# Patient Record
Sex: Male | Born: 1945
Health system: Southern US, Community
[De-identification: ages and names within clinical notes are randomized; demographics above are authoritative.]

## PROBLEM LIST (undated history)

## (undated) DIAGNOSIS — H9190 Unspecified hearing loss, unspecified ear: Secondary | ICD-10-CM

## (undated) DIAGNOSIS — A0472 Enterocolitis due to Clostridium difficile, not specified as recurrent: Secondary | ICD-10-CM

## (undated) DIAGNOSIS — N529 Male erectile dysfunction, unspecified: Secondary | ICD-10-CM

## (undated) DIAGNOSIS — G8929 Other chronic pain: Secondary | ICD-10-CM

## (undated) DIAGNOSIS — I2510 Atherosclerotic heart disease of native coronary artery without angina pectoris: Secondary | ICD-10-CM

## (undated) DIAGNOSIS — I4891 Unspecified atrial fibrillation: Secondary | ICD-10-CM

## (undated) DIAGNOSIS — I509 Heart failure, unspecified: Secondary | ICD-10-CM

## (undated) DIAGNOSIS — N400 Enlarged prostate without lower urinary tract symptoms: Secondary | ICD-10-CM

## (undated) DIAGNOSIS — R0602 Shortness of breath: Secondary | ICD-10-CM

## (undated) DIAGNOSIS — M359 Systemic involvement of connective tissue, unspecified: Secondary | ICD-10-CM

## (undated) DIAGNOSIS — E785 Hyperlipidemia, unspecified: Secondary | ICD-10-CM

## (undated) DIAGNOSIS — F419 Anxiety disorder, unspecified: Secondary | ICD-10-CM

## (undated) DIAGNOSIS — Z720 Tobacco use: Secondary | ICD-10-CM

## (undated) DIAGNOSIS — E1142 Type 2 diabetes mellitus with diabetic polyneuropathy: Secondary | ICD-10-CM

## (undated) DIAGNOSIS — N200 Calculus of kidney: Secondary | ICD-10-CM

## (undated) DIAGNOSIS — F32A Depression, unspecified: Secondary | ICD-10-CM

## (undated) DIAGNOSIS — K219 Gastro-esophageal reflux disease without esophagitis: Secondary | ICD-10-CM

## (undated) DIAGNOSIS — Z9889 Other specified postprocedural states: Secondary | ICD-10-CM

## (undated) DIAGNOSIS — M1990 Unspecified osteoarthritis, unspecified site: Secondary | ICD-10-CM

## (undated) DIAGNOSIS — D369 Benign neoplasm, unspecified site: Secondary | ICD-10-CM

## (undated) DIAGNOSIS — Z950 Presence of cardiac pacemaker: Secondary | ICD-10-CM

## (undated) DIAGNOSIS — I639 Cerebral infarction, unspecified: Secondary | ICD-10-CM

## (undated) DIAGNOSIS — E291 Testicular hypofunction: Secondary | ICD-10-CM

## (undated) DIAGNOSIS — M545 Low back pain, unspecified: Secondary | ICD-10-CM

## (undated) DIAGNOSIS — K8020 Calculus of gallbladder without cholecystitis without obstruction: Secondary | ICD-10-CM

## (undated) DIAGNOSIS — R519 Headache, unspecified: Secondary | ICD-10-CM

## (undated) DIAGNOSIS — F329 Major depressive disorder, single episode, unspecified: Secondary | ICD-10-CM

## (undated) DIAGNOSIS — R51 Headache: Secondary | ICD-10-CM

## (undated) DIAGNOSIS — J449 Chronic obstructive pulmonary disease, unspecified: Secondary | ICD-10-CM

## (undated) DIAGNOSIS — J309 Allergic rhinitis, unspecified: Secondary | ICD-10-CM

## (undated) DIAGNOSIS — Z95810 Presence of automatic (implantable) cardiac defibrillator: Secondary | ICD-10-CM

## (undated) DIAGNOSIS — I219 Acute myocardial infarction, unspecified: Secondary | ICD-10-CM

## (undated) DIAGNOSIS — Z8673 Personal history of transient ischemic attack (TIA), and cerebral infarction without residual deficits: Secondary | ICD-10-CM

## (undated) DIAGNOSIS — H43392 Other vitreous opacities, left eye: Secondary | ICD-10-CM

## (undated) DIAGNOSIS — E119 Type 2 diabetes mellitus without complications: Secondary | ICD-10-CM

## (undated) DIAGNOSIS — I10 Essential (primary) hypertension: Secondary | ICD-10-CM

## (undated) DIAGNOSIS — I679 Cerebrovascular disease, unspecified: Secondary | ICD-10-CM

## (undated) DIAGNOSIS — R413 Other amnesia: Secondary | ICD-10-CM

## (undated) HISTORY — DX: Gastro-esophageal reflux disease without esophagitis: K21.9

## (undated) HISTORY — DX: Benign neoplasm, unspecified site: D36.9

## (undated) HISTORY — DX: Testicular hypofunction: E29.1

## (undated) HISTORY — DX: Atherosclerotic heart disease of native coronary artery without angina pectoris: I25.10

## (undated) HISTORY — DX: Essential (primary) hypertension: I10

## (undated) HISTORY — DX: Enterocolitis due to Clostridium difficile, not specified as recurrent: A04.72

## (undated) HISTORY — PX: RETINAL LASER PROCEDURE: SHX2339

## (undated) HISTORY — DX: Cerebrovascular disease, unspecified: I67.9

## (undated) HISTORY — PX: BACK SURGERY: SHX140

## (undated) HISTORY — DX: Male erectile dysfunction, unspecified: N52.9

## (undated) HISTORY — PX: NASAL HEMORRHAGE CONTROL: SHX287

## (undated) HISTORY — DX: Tobacco use: Z72.0

## (undated) HISTORY — PX: FRACTURE SURGERY: SHX138

## (undated) HISTORY — PX: TONSILLECTOMY AND ADENOIDECTOMY: SUR1326

## (undated) HISTORY — DX: Chronic obstructive pulmonary disease, unspecified: J44.9

## (undated) HISTORY — DX: Calculus of gallbladder without cholecystitis without obstruction: K80.20

## (undated) HISTORY — DX: Benign prostatic hyperplasia without lower urinary tract symptoms: N40.0

## (undated) HISTORY — DX: Calculus of kidney: N20.0

## (undated) HISTORY — DX: Allergic rhinitis, unspecified: J30.9

## (undated) HISTORY — DX: Unspecified hearing loss, unspecified ear: H91.90

## (undated) HISTORY — DX: Unspecified atrial fibrillation: I48.91

## (undated) HISTORY — DX: Other amnesia: R41.3

## (undated) HISTORY — PX: INSERT / REPLACE / REMOVE PACEMAKER: SUR710

## (undated) HISTORY — DX: Hyperlipidemia, unspecified: E78.5

## (undated) HISTORY — DX: Type 2 diabetes mellitus with diabetic polyneuropathy: E11.42

## (undated) HISTORY — DX: Other vitreous opacities, left eye: H43.392

## (undated) HISTORY — PX: LUMBAR DISC SURGERY: SHX700

## (undated) HISTORY — DX: Unspecified osteoarthritis, unspecified site: M19.90

## (undated) HISTORY — DX: Cerebral infarction, unspecified: I63.9

## (undated) HISTORY — PX: CORONARY ANGIOPLASTY WITH STENT PLACEMENT: SHX49

## (undated) HISTORY — DX: Other specified postprocedural states: Z98.890

---

## 1984-05-03 HISTORY — PX: OTHER SURGICAL HISTORY: SHX169

## 1993-05-03 DIAGNOSIS — I219 Acute myocardial infarction, unspecified: Secondary | ICD-10-CM

## 1993-05-03 HISTORY — DX: Acute myocardial infarction, unspecified: I21.9

## 1995-05-04 DIAGNOSIS — I2510 Atherosclerotic heart disease of native coronary artery without angina pectoris: Secondary | ICD-10-CM

## 1995-05-04 HISTORY — DX: Atherosclerotic heart disease of native coronary artery without angina pectoris: I25.10

## 1998-06-16 ENCOUNTER — Encounter: Payer: Self-pay | Admitting: Specialist

## 1998-06-16 ENCOUNTER — Ambulatory Visit (HOSPITAL_COMMUNITY): Admission: RE | Admit: 1998-06-16 | Discharge: 1998-06-16 | Payer: Self-pay | Admitting: Specialist

## 1998-08-14 ENCOUNTER — Inpatient Hospital Stay (HOSPITAL_COMMUNITY): Admission: RE | Admit: 1998-08-14 | Discharge: 1998-08-18 | Payer: Self-pay | Admitting: Specialist

## 1998-08-14 ENCOUNTER — Encounter: Payer: Self-pay | Admitting: Specialist

## 1999-10-08 ENCOUNTER — Ambulatory Visit (HOSPITAL_COMMUNITY): Admission: RE | Admit: 1999-10-08 | Discharge: 1999-10-08 | Payer: Self-pay | Admitting: Urology

## 1999-10-08 ENCOUNTER — Encounter: Payer: Self-pay | Admitting: Urology

## 2000-05-03 DIAGNOSIS — M1990 Unspecified osteoarthritis, unspecified site: Secondary | ICD-10-CM

## 2000-05-03 HISTORY — DX: Unspecified osteoarthritis, unspecified site: M19.90

## 2000-08-24 ENCOUNTER — Ambulatory Visit (HOSPITAL_COMMUNITY): Admission: RE | Admit: 2000-08-24 | Discharge: 2000-08-24 | Payer: Self-pay | Admitting: Family Medicine

## 2000-08-24 ENCOUNTER — Encounter: Payer: Self-pay | Admitting: Family Medicine

## 2000-12-01 ENCOUNTER — Encounter: Payer: Self-pay | Admitting: Family Medicine

## 2000-12-01 ENCOUNTER — Ambulatory Visit (HOSPITAL_COMMUNITY): Admission: RE | Admit: 2000-12-01 | Discharge: 2000-12-01 | Payer: Self-pay | Admitting: Family Medicine

## 2000-12-01 HISTORY — PX: ANTERIOR FUSION CERVICAL SPINE: SUR626

## 2000-12-12 ENCOUNTER — Encounter: Admission: RE | Admit: 2000-12-12 | Discharge: 2000-12-12 | Payer: Self-pay | Admitting: Neurosurgery

## 2000-12-12 ENCOUNTER — Encounter: Payer: Self-pay | Admitting: Neurosurgery

## 2000-12-21 ENCOUNTER — Encounter: Payer: Self-pay | Admitting: Neurosurgery

## 2000-12-21 ENCOUNTER — Inpatient Hospital Stay (HOSPITAL_COMMUNITY): Admission: RE | Admit: 2000-12-21 | Discharge: 2000-12-22 | Payer: Self-pay | Admitting: Neurosurgery

## 2001-01-12 ENCOUNTER — Encounter: Admission: RE | Admit: 2001-01-12 | Discharge: 2001-01-12 | Payer: Self-pay | Admitting: Neurosurgery

## 2001-01-12 ENCOUNTER — Encounter: Payer: Self-pay | Admitting: Neurosurgery

## 2001-02-13 ENCOUNTER — Encounter: Payer: Self-pay | Admitting: Neurosurgery

## 2001-02-13 ENCOUNTER — Encounter: Admission: RE | Admit: 2001-02-13 | Discharge: 2001-02-13 | Payer: Self-pay | Admitting: Neurosurgery

## 2001-03-21 ENCOUNTER — Encounter (HOSPITAL_COMMUNITY): Admission: RE | Admit: 2001-03-21 | Discharge: 2001-04-20 | Payer: Self-pay | Admitting: Neurosurgery

## 2001-04-04 ENCOUNTER — Ambulatory Visit (HOSPITAL_COMMUNITY): Admission: RE | Admit: 2001-04-04 | Discharge: 2001-04-04 | Payer: Self-pay | Admitting: Family Medicine

## 2001-04-04 ENCOUNTER — Encounter: Payer: Self-pay | Admitting: Family Medicine

## 2001-04-20 ENCOUNTER — Encounter (HOSPITAL_COMMUNITY): Admission: RE | Admit: 2001-04-20 | Discharge: 2001-05-20 | Payer: Self-pay | Admitting: Neurosurgery

## 2001-05-22 ENCOUNTER — Encounter (HOSPITAL_COMMUNITY): Admission: RE | Admit: 2001-05-22 | Discharge: 2001-06-21 | Payer: Self-pay | Admitting: Neurosurgery

## 2001-06-21 ENCOUNTER — Ambulatory Visit (HOSPITAL_COMMUNITY): Admission: RE | Admit: 2001-06-21 | Discharge: 2001-06-21 | Payer: Self-pay | Admitting: Neurosurgery

## 2001-07-20 ENCOUNTER — Ambulatory Visit (HOSPITAL_COMMUNITY): Admission: RE | Admit: 2001-07-20 | Discharge: 2001-07-20 | Payer: Self-pay | Admitting: Family Medicine

## 2001-07-20 ENCOUNTER — Encounter: Payer: Self-pay | Admitting: Family Medicine

## 2001-08-22 ENCOUNTER — Ambulatory Visit (HOSPITAL_COMMUNITY): Admission: RE | Admit: 2001-08-22 | Discharge: 2001-08-22 | Payer: Self-pay

## 2001-10-30 ENCOUNTER — Encounter: Payer: Self-pay | Admitting: Orthopaedic Surgery

## 2001-10-30 ENCOUNTER — Ambulatory Visit (HOSPITAL_COMMUNITY): Admission: RE | Admit: 2001-10-30 | Discharge: 2001-10-30 | Payer: Self-pay | Admitting: Orthopaedic Surgery

## 2001-11-15 ENCOUNTER — Encounter: Admission: RE | Admit: 2001-11-15 | Discharge: 2001-11-15 | Payer: Self-pay | Admitting: Orthopaedic Surgery

## 2001-11-15 ENCOUNTER — Encounter: Payer: Self-pay | Admitting: Orthopaedic Surgery

## 2001-12-18 ENCOUNTER — Encounter: Payer: Self-pay | Admitting: Orthopaedic Surgery

## 2001-12-21 ENCOUNTER — Inpatient Hospital Stay (HOSPITAL_COMMUNITY): Admission: AD | Admit: 2001-12-21 | Discharge: 2001-12-27 | Payer: Self-pay | Admitting: Family Medicine

## 2002-01-15 ENCOUNTER — Encounter (HOSPITAL_COMMUNITY): Admission: RE | Admit: 2002-01-15 | Discharge: 2002-02-14 | Payer: Self-pay | Admitting: Cardiology

## 2002-01-30 ENCOUNTER — Ambulatory Visit (HOSPITAL_COMMUNITY): Admission: RE | Admit: 2002-01-30 | Discharge: 2002-01-30 | Payer: Self-pay | Admitting: Cardiology

## 2002-01-31 ENCOUNTER — Ambulatory Visit (HOSPITAL_COMMUNITY): Admission: RE | Admit: 2002-01-31 | Discharge: 2002-02-01 | Payer: Self-pay | Admitting: *Deleted

## 2002-02-12 ENCOUNTER — Encounter (HOSPITAL_COMMUNITY): Admission: RE | Admit: 2002-02-12 | Discharge: 2002-03-14 | Payer: Self-pay | Admitting: Cardiology

## 2002-02-16 ENCOUNTER — Encounter: Payer: Self-pay | Admitting: Family Medicine

## 2002-02-16 ENCOUNTER — Ambulatory Visit (HOSPITAL_COMMUNITY): Admission: RE | Admit: 2002-02-16 | Discharge: 2002-02-16 | Payer: Self-pay | Admitting: Family Medicine

## 2002-03-18 ENCOUNTER — Emergency Department (HOSPITAL_COMMUNITY): Admission: EM | Admit: 2002-03-18 | Discharge: 2002-03-19 | Payer: Self-pay | Admitting: Emergency Medicine

## 2002-08-29 ENCOUNTER — Inpatient Hospital Stay (HOSPITAL_COMMUNITY): Admission: RE | Admit: 2002-08-29 | Discharge: 2002-08-30 | Payer: Self-pay | Admitting: Orthopaedic Surgery

## 2002-08-29 ENCOUNTER — Encounter: Payer: Self-pay | Admitting: Orthopaedic Surgery

## 2003-05-04 DIAGNOSIS — Z95810 Presence of automatic (implantable) cardiac defibrillator: Secondary | ICD-10-CM

## 2003-05-04 HISTORY — DX: Presence of automatic (implantable) cardiac defibrillator: Z95.810

## 2003-07-17 ENCOUNTER — Inpatient Hospital Stay (HOSPITAL_COMMUNITY): Admission: AD | Admit: 2003-07-17 | Discharge: 2003-07-19 | Payer: Self-pay | Admitting: Family Medicine

## 2003-07-19 ENCOUNTER — Ambulatory Visit (HOSPITAL_COMMUNITY): Admission: RE | Admit: 2003-07-19 | Discharge: 2003-07-20 | Payer: Self-pay | Admitting: *Deleted

## 2003-11-01 HISTORY — PX: CARDIAC DEFIBRILLATOR PLACEMENT: SHX171

## 2003-11-20 ENCOUNTER — Ambulatory Visit (HOSPITAL_COMMUNITY): Admission: RE | Admit: 2003-11-20 | Discharge: 2003-11-20 | Payer: Self-pay | Admitting: Internal Medicine

## 2003-11-25 ENCOUNTER — Encounter: Payer: Self-pay | Admitting: Cardiology

## 2003-11-25 ENCOUNTER — Inpatient Hospital Stay (HOSPITAL_COMMUNITY): Admission: EM | Admit: 2003-11-25 | Discharge: 2003-11-26 | Payer: Self-pay | Admitting: Emergency Medicine

## 2003-12-26 ENCOUNTER — Ambulatory Visit (HOSPITAL_COMMUNITY): Admission: RE | Admit: 2003-12-26 | Discharge: 2003-12-26 | Payer: Self-pay | Admitting: Family Medicine

## 2004-01-30 ENCOUNTER — Ambulatory Visit (HOSPITAL_COMMUNITY): Admission: RE | Admit: 2004-01-30 | Discharge: 2004-01-30 | Payer: Self-pay | Admitting: Family Medicine

## 2004-03-24 ENCOUNTER — Ambulatory Visit (HOSPITAL_COMMUNITY): Admission: RE | Admit: 2004-03-24 | Discharge: 2004-03-24 | Payer: Self-pay | Admitting: Family Medicine

## 2004-04-20 ENCOUNTER — Observation Stay (HOSPITAL_COMMUNITY): Admission: RE | Admit: 2004-04-20 | Discharge: 2004-04-20 | Payer: Self-pay | Admitting: Specialist

## 2004-07-16 ENCOUNTER — Ambulatory Visit: Payer: Self-pay | Admitting: Cardiology

## 2004-08-04 ENCOUNTER — Ambulatory Visit: Payer: Self-pay | Admitting: *Deleted

## 2005-02-12 ENCOUNTER — Emergency Department (HOSPITAL_COMMUNITY): Admission: EM | Admit: 2005-02-12 | Discharge: 2005-02-13 | Payer: Self-pay | Admitting: *Deleted

## 2005-05-03 HISTORY — PX: COLONOSCOPY: SHX174

## 2005-06-22 ENCOUNTER — Ambulatory Visit: Payer: Self-pay | Admitting: Gastroenterology

## 2005-07-06 ENCOUNTER — Ambulatory Visit: Payer: Self-pay | Admitting: Gastroenterology

## 2005-07-06 ENCOUNTER — Encounter (INDEPENDENT_AMBULATORY_CARE_PROVIDER_SITE_OTHER): Payer: Self-pay | Admitting: *Deleted

## 2005-09-01 ENCOUNTER — Ambulatory Visit: Payer: Self-pay | Admitting: Gastroenterology

## 2005-09-03 ENCOUNTER — Encounter (INDEPENDENT_AMBULATORY_CARE_PROVIDER_SITE_OTHER): Payer: Self-pay | Admitting: *Deleted

## 2005-09-03 ENCOUNTER — Ambulatory Visit: Payer: Self-pay | Admitting: Gastroenterology

## 2005-09-06 ENCOUNTER — Ambulatory Visit: Payer: Self-pay | Admitting: Cardiology

## 2005-10-14 ENCOUNTER — Ambulatory Visit: Payer: Self-pay | Admitting: *Deleted

## 2006-01-07 ENCOUNTER — Ambulatory Visit: Payer: Self-pay | Admitting: Internal Medicine

## 2006-02-17 ENCOUNTER — Ambulatory Visit: Payer: Self-pay | Admitting: Cardiology

## 2006-02-17 ENCOUNTER — Ambulatory Visit (HOSPITAL_COMMUNITY): Admission: RE | Admit: 2006-02-17 | Discharge: 2006-02-17 | Payer: Self-pay | Admitting: Cardiology

## 2006-02-23 ENCOUNTER — Ambulatory Visit: Payer: Self-pay | Admitting: Cardiovascular Disease

## 2006-02-23 ENCOUNTER — Encounter (HOSPITAL_COMMUNITY): Admission: RE | Admit: 2006-02-23 | Discharge: 2006-03-25 | Payer: Self-pay | Admitting: Cardiology

## 2006-02-28 ENCOUNTER — Ambulatory Visit: Payer: Self-pay | Admitting: Cardiology

## 2006-04-08 ENCOUNTER — Ambulatory Visit: Payer: Self-pay | Admitting: Internal Medicine

## 2006-05-03 HISTORY — PX: KNEE ARTHROSCOPY: SHX127

## 2006-05-04 ENCOUNTER — Ambulatory Visit: Payer: Self-pay | Admitting: Cardiology

## 2006-05-13 ENCOUNTER — Ambulatory Visit: Payer: Self-pay | Admitting: Internal Medicine

## 2006-07-20 ENCOUNTER — Encounter: Admission: RE | Admit: 2006-07-20 | Discharge: 2006-07-20 | Payer: Self-pay | Admitting: Orthopedic Surgery

## 2006-08-23 ENCOUNTER — Ambulatory Visit: Payer: Self-pay | Admitting: Cardiology

## 2006-09-01 HISTORY — PX: ANKLE FRACTURE SURGERY: SHX122

## 2006-09-07 ENCOUNTER — Ambulatory Visit (HOSPITAL_COMMUNITY): Admission: RE | Admit: 2006-09-07 | Discharge: 2006-09-07 | Payer: Self-pay | Admitting: Orthopedic Surgery

## 2006-09-21 ENCOUNTER — Encounter (HOSPITAL_COMMUNITY): Admission: RE | Admit: 2006-09-21 | Discharge: 2006-10-21 | Payer: Self-pay | Admitting: Orthopedic Surgery

## 2006-10-07 ENCOUNTER — Ambulatory Visit: Payer: Self-pay | Admitting: Internal Medicine

## 2006-11-15 ENCOUNTER — Ambulatory Visit: Payer: Self-pay | Admitting: Cardiology

## 2006-12-22 ENCOUNTER — Ambulatory Visit: Payer: Self-pay | Admitting: Endocrinology

## 2006-12-29 ENCOUNTER — Ambulatory Visit: Payer: Self-pay | Admitting: Endocrinology

## 2006-12-29 LAB — CONVERTED CEMR LAB: Testosterone: 264.12 ng/dL — ABNORMAL LOW (ref 350.00–890)

## 2007-01-12 ENCOUNTER — Encounter: Payer: Self-pay | Admitting: Endocrinology

## 2007-01-12 ENCOUNTER — Ambulatory Visit: Payer: Self-pay | Admitting: Endocrinology

## 2007-01-12 DIAGNOSIS — K219 Gastro-esophageal reflux disease without esophagitis: Secondary | ICD-10-CM | POA: Insufficient documentation

## 2007-01-12 DIAGNOSIS — E1159 Type 2 diabetes mellitus with other circulatory complications: Secondary | ICD-10-CM | POA: Insufficient documentation

## 2007-01-12 DIAGNOSIS — F329 Major depressive disorder, single episode, unspecified: Secondary | ICD-10-CM | POA: Insufficient documentation

## 2007-01-12 DIAGNOSIS — I10 Essential (primary) hypertension: Secondary | ICD-10-CM | POA: Insufficient documentation

## 2007-01-12 DIAGNOSIS — N400 Enlarged prostate without lower urinary tract symptoms: Secondary | ICD-10-CM | POA: Insufficient documentation

## 2007-01-12 DIAGNOSIS — J309 Allergic rhinitis, unspecified: Secondary | ICD-10-CM | POA: Insufficient documentation

## 2007-01-12 LAB — CONVERTED CEMR LAB
FSH: 6.8 milliintl units/mL
INR: 0.9 (ref 0.8–1.0)
LH: 5 milliintl units/mL
Prothrombin Time: 11.2 s (ref 10.9–13.3)
TSH: 1.95 microintl units/mL (ref 0.35–5.50)

## 2007-01-20 ENCOUNTER — Ambulatory Visit (HOSPITAL_COMMUNITY): Admission: RE | Admit: 2007-01-20 | Discharge: 2007-01-20 | Payer: Self-pay | Admitting: Family Medicine

## 2007-02-02 ENCOUNTER — Encounter: Payer: Self-pay | Admitting: *Deleted

## 2007-02-03 ENCOUNTER — Encounter: Payer: Self-pay | Admitting: Endocrinology

## 2007-02-03 ENCOUNTER — Ambulatory Visit: Payer: Self-pay | Admitting: Endocrinology

## 2007-02-03 LAB — CONVERTED CEMR LAB
BUN: 7 mg/dL (ref 6–23)
Creatinine, Ser: 0.8 mg/dL (ref 0.4–1.5)
Prolactin: 7.1 ng/mL
Testosterone: 274.5 ng/dL — ABNORMAL LOW (ref 350.00–890)

## 2007-02-06 ENCOUNTER — Ambulatory Visit: Admission: RE | Admit: 2007-02-06 | Discharge: 2007-02-06 | Payer: Self-pay | Admitting: Family Medicine

## 2007-02-15 ENCOUNTER — Ambulatory Visit: Payer: Self-pay | Admitting: Pulmonary Disease

## 2007-02-16 ENCOUNTER — Ambulatory Visit: Payer: Self-pay | Admitting: Cardiology

## 2007-03-06 ENCOUNTER — Ambulatory Visit: Payer: Self-pay | Admitting: Endocrinology

## 2007-03-06 DIAGNOSIS — E291 Testicular hypofunction: Secondary | ICD-10-CM | POA: Insufficient documentation

## 2007-04-07 ENCOUNTER — Ambulatory Visit (HOSPITAL_COMMUNITY): Admission: RE | Admit: 2007-04-07 | Discharge: 2007-04-07 | Payer: Self-pay | Admitting: Specialist

## 2007-05-02 ENCOUNTER — Ambulatory Visit: Payer: Self-pay | Admitting: Endocrinology

## 2007-05-03 LAB — CONVERTED CEMR LAB
Hgb A1c MFr Bld: 9 % — ABNORMAL HIGH (ref 4.6–6.0)
Testosterone: 439.78 ng/dL (ref 350.00–890)

## 2007-05-04 HISTORY — PX: TOTAL KNEE ARTHROPLASTY: SHX125

## 2007-05-09 ENCOUNTER — Ambulatory Visit: Payer: Self-pay | Admitting: Internal Medicine

## 2007-05-10 ENCOUNTER — Ambulatory Visit: Payer: Self-pay | Admitting: Cardiology

## 2007-06-13 ENCOUNTER — Ambulatory Visit: Payer: Self-pay | Admitting: Endocrinology

## 2007-06-22 ENCOUNTER — Telehealth (INDEPENDENT_AMBULATORY_CARE_PROVIDER_SITE_OTHER): Payer: Self-pay | Admitting: *Deleted

## 2007-07-07 ENCOUNTER — Ambulatory Visit: Payer: Self-pay | Admitting: Internal Medicine

## 2007-08-10 ENCOUNTER — Encounter (INDEPENDENT_AMBULATORY_CARE_PROVIDER_SITE_OTHER): Payer: Self-pay | Admitting: *Deleted

## 2007-08-10 LAB — CONVERTED CEMR LAB
ALT: 32 units/L
AST: 25 units/L
Albumin: 3.9 g/dL
Alkaline Phosphatase: 57 units/L
BUN: 13 mg/dL
CO2: 26 meq/L
Calcium: 8.9 mg/dL
Chloride: 105 meq/L
Cholesterol: 151 mg/dL
Creatinine, Ser: 0.95 mg/dL
Glucose, Bld: 273 mg/dL
HDL: 33 mg/dL
LDL Cholesterol: 83 mg/dL
Potassium: 4.2 meq/L
Sodium: 141 meq/L
Total Protein: 6 g/dL
Triglycerides: 177 mg/dL

## 2007-08-29 ENCOUNTER — Ambulatory Visit: Payer: Self-pay | Admitting: Endocrinology

## 2007-08-29 LAB — CONVERTED CEMR LAB
Creatinine,U: 92.1 mg/dL
Hgb A1c MFr Bld: 9.9 % — ABNORMAL HIGH (ref 4.6–6.0)
Microalb Creat Ratio: 14.1 mg/g (ref 0.0–30.0)
Microalb, Ur: 1.3 mg/dL (ref 0.0–1.9)

## 2007-09-05 ENCOUNTER — Ambulatory Visit: Payer: Self-pay | Admitting: Cardiology

## 2007-09-07 ENCOUNTER — Ambulatory Visit: Payer: Self-pay | Admitting: Cardiology

## 2007-09-07 ENCOUNTER — Encounter (HOSPITAL_COMMUNITY): Admission: RE | Admit: 2007-09-07 | Discharge: 2007-10-07 | Payer: Self-pay | Admitting: Cardiology

## 2007-09-13 ENCOUNTER — Inpatient Hospital Stay (HOSPITAL_COMMUNITY): Admission: RE | Admit: 2007-09-13 | Discharge: 2007-09-17 | Payer: Self-pay | Admitting: Specialist

## 2007-10-09 ENCOUNTER — Ambulatory Visit: Payer: Self-pay | Admitting: Internal Medicine

## 2007-11-06 ENCOUNTER — Ambulatory Visit: Payer: Self-pay | Admitting: Cardiovascular Disease

## 2007-11-17 ENCOUNTER — Observation Stay (HOSPITAL_COMMUNITY): Admission: EM | Admit: 2007-11-17 | Discharge: 2007-11-18 | Payer: Self-pay | Admitting: Emergency Medicine

## 2007-11-17 ENCOUNTER — Ambulatory Visit: Payer: Self-pay | Admitting: Cardiology

## 2007-11-20 ENCOUNTER — Ambulatory Visit: Payer: Self-pay | Admitting: Endocrinology

## 2007-11-20 LAB — CONVERTED CEMR LAB: Hgb A1c MFr Bld: 8.2 % — ABNORMAL HIGH (ref 4.6–6.0)

## 2007-11-21 ENCOUNTER — Telehealth (INDEPENDENT_AMBULATORY_CARE_PROVIDER_SITE_OTHER): Payer: Self-pay | Admitting: *Deleted

## 2007-11-27 ENCOUNTER — Encounter (INDEPENDENT_AMBULATORY_CARE_PROVIDER_SITE_OTHER): Payer: Self-pay | Admitting: *Deleted

## 2007-11-27 ENCOUNTER — Ambulatory Visit: Payer: Self-pay | Admitting: Cardiology

## 2007-11-27 LAB — CONVERTED CEMR LAB: TSH: 1.768 microintl units/mL

## 2007-12-07 ENCOUNTER — Ambulatory Visit: Payer: Self-pay | Admitting: Endocrinology

## 2007-12-07 DIAGNOSIS — E230 Hypopituitarism: Secondary | ICD-10-CM | POA: Insufficient documentation

## 2007-12-07 DIAGNOSIS — N529 Male erectile dysfunction, unspecified: Secondary | ICD-10-CM | POA: Insufficient documentation

## 2007-12-07 LAB — CONVERTED CEMR LAB
Iron: 63 ug/dL (ref 42–165)
Saturation Ratios: 22.5 % (ref 20.0–50.0)
Testosterone: 584.63 ng/dL (ref 350.00–890)
Transferrin: 200.1 mg/dL — ABNORMAL LOW (ref >=212.0)

## 2007-12-19 ENCOUNTER — Ambulatory Visit: Payer: Self-pay | Admitting: Cardiology

## 2007-12-29 ENCOUNTER — Ambulatory Visit: Payer: Self-pay | Admitting: Endocrinology

## 2008-01-10 ENCOUNTER — Ambulatory Visit (HOSPITAL_COMMUNITY): Admission: RE | Admit: 2008-01-10 | Discharge: 2008-01-11 | Payer: Self-pay | Admitting: Urology

## 2008-01-17 ENCOUNTER — Ambulatory Visit: Payer: Self-pay | Admitting: Cardiology

## 2008-01-19 ENCOUNTER — Ambulatory Visit: Payer: Self-pay | Admitting: Endocrinology

## 2008-01-31 ENCOUNTER — Ambulatory Visit: Payer: Self-pay | Admitting: Internal Medicine

## 2008-03-01 ENCOUNTER — Ambulatory Visit: Payer: Self-pay | Admitting: Endocrinology

## 2008-03-01 LAB — CONVERTED CEMR LAB: Hgb A1c MFr Bld: 8.5 % — ABNORMAL HIGH (ref 4.6–6.0)

## 2008-03-04 ENCOUNTER — Telehealth (INDEPENDENT_AMBULATORY_CARE_PROVIDER_SITE_OTHER): Payer: Self-pay | Admitting: *Deleted

## 2008-04-19 ENCOUNTER — Telehealth (INDEPENDENT_AMBULATORY_CARE_PROVIDER_SITE_OTHER): Payer: Self-pay | Admitting: *Deleted

## 2008-04-29 ENCOUNTER — Ambulatory Visit: Payer: Self-pay | Admitting: Internal Medicine

## 2008-05-03 DIAGNOSIS — I679 Cerebrovascular disease, unspecified: Secondary | ICD-10-CM

## 2008-05-03 DIAGNOSIS — Z8673 Personal history of transient ischemic attack (TIA), and cerebral infarction without residual deficits: Secondary | ICD-10-CM

## 2008-05-03 HISTORY — DX: Cerebrovascular disease, unspecified: I67.9

## 2008-05-03 HISTORY — DX: Personal history of transient ischemic attack (TIA), and cerebral infarction without residual deficits: Z86.73

## 2008-05-21 ENCOUNTER — Ambulatory Visit (HOSPITAL_COMMUNITY): Admission: RE | Admit: 2008-05-21 | Discharge: 2008-05-21 | Payer: Self-pay | Admitting: Family Medicine

## 2008-05-23 ENCOUNTER — Ambulatory Visit: Payer: Self-pay | Admitting: Cardiology

## 2008-05-24 ENCOUNTER — Encounter: Payer: Self-pay | Admitting: Cardiology

## 2008-05-24 ENCOUNTER — Ambulatory Visit: Payer: Self-pay | Admitting: Cardiology

## 2008-05-24 ENCOUNTER — Ambulatory Visit (HOSPITAL_COMMUNITY): Admission: RE | Admit: 2008-05-24 | Discharge: 2008-05-24 | Payer: Self-pay | Admitting: Cardiology

## 2008-06-14 ENCOUNTER — Ambulatory Visit: Payer: Self-pay | Admitting: Endocrinology

## 2008-06-14 LAB — CONVERTED CEMR LAB: Hgb A1c MFr Bld: 9.4 % — ABNORMAL HIGH (ref 4.6–6.0)

## 2008-06-17 ENCOUNTER — Telehealth: Payer: Self-pay | Admitting: Endocrinology

## 2008-06-18 ENCOUNTER — Encounter: Payer: Self-pay | Admitting: Cardiology

## 2008-06-21 ENCOUNTER — Encounter: Admission: RE | Admit: 2008-06-21 | Discharge: 2008-06-21 | Payer: Self-pay | Admitting: Neurology

## 2008-06-28 ENCOUNTER — Encounter: Payer: Self-pay | Admitting: Internal Medicine

## 2008-07-03 ENCOUNTER — Ambulatory Visit: Payer: Self-pay | Admitting: Internal Medicine

## 2008-07-10 ENCOUNTER — Ambulatory Visit: Payer: Self-pay | Admitting: Cardiology

## 2008-07-29 ENCOUNTER — Telehealth (INDEPENDENT_AMBULATORY_CARE_PROVIDER_SITE_OTHER): Payer: Self-pay | Admitting: *Deleted

## 2008-08-06 ENCOUNTER — Telehealth: Payer: Self-pay | Admitting: Endocrinology

## 2008-08-09 ENCOUNTER — Encounter (INDEPENDENT_AMBULATORY_CARE_PROVIDER_SITE_OTHER): Payer: Self-pay | Admitting: *Deleted

## 2008-08-09 LAB — CONVERTED CEMR LAB
ALT: 32 units/L
AST: 25 units/L
Albumin: 3.9 g/dL
Alkaline Phosphatase: 57 units/L
BUN: 13 mg/dL
CO2: 26 meq/L
Calcium: 8 mg/dL
Chloride: 105 meq/L
Creatinine, Ser: 0.95 mg/dL
Glucose, Bld: 273 mg/dL
Potassium: 4.2 meq/L
Sodium: 141 meq/L
Total Protein: 6 g/dL

## 2008-09-12 ENCOUNTER — Ambulatory Visit: Payer: Self-pay | Admitting: Endocrinology

## 2008-10-03 ENCOUNTER — Encounter: Payer: Self-pay | Admitting: Internal Medicine

## 2008-10-10 ENCOUNTER — Ambulatory Visit: Payer: Self-pay | Admitting: Endocrinology

## 2008-11-14 ENCOUNTER — Ambulatory Visit: Payer: Self-pay | Admitting: Internal Medicine

## 2008-11-20 ENCOUNTER — Encounter: Payer: Self-pay | Admitting: Internal Medicine

## 2008-12-12 ENCOUNTER — Ambulatory Visit: Payer: Self-pay | Admitting: Endocrinology

## 2008-12-12 LAB — CONVERTED CEMR LAB
Hgb A1c MFr Bld: 9.7 % — ABNORMAL HIGH (ref 4.6–6.5)
Testosterone: 280.64 ng/dL — ABNORMAL LOW (ref 350.00–890.00)

## 2009-01-02 ENCOUNTER — Telehealth (INDEPENDENT_AMBULATORY_CARE_PROVIDER_SITE_OTHER): Payer: Self-pay | Admitting: *Deleted

## 2009-01-05 ENCOUNTER — Observation Stay (HOSPITAL_COMMUNITY): Admission: EM | Admit: 2009-01-05 | Discharge: 2009-01-05 | Payer: Self-pay | Admitting: Emergency Medicine

## 2009-01-07 ENCOUNTER — Ambulatory Visit: Payer: Self-pay | Admitting: Endocrinology

## 2009-01-07 DIAGNOSIS — R55 Syncope and collapse: Secondary | ICD-10-CM | POA: Insufficient documentation

## 2009-01-07 DIAGNOSIS — I951 Orthostatic hypotension: Secondary | ICD-10-CM | POA: Insufficient documentation

## 2009-01-21 ENCOUNTER — Ambulatory Visit: Payer: Self-pay | Admitting: Endocrinology

## 2009-01-21 ENCOUNTER — Telehealth: Payer: Self-pay | Admitting: Endocrinology

## 2009-01-30 ENCOUNTER — Ambulatory Visit: Payer: Self-pay | Admitting: Endocrinology

## 2009-02-21 ENCOUNTER — Encounter: Payer: Self-pay | Admitting: Internal Medicine

## 2009-03-03 ENCOUNTER — Ambulatory Visit: Payer: Self-pay | Admitting: Endocrinology

## 2009-03-12 ENCOUNTER — Ambulatory Visit: Payer: Self-pay | Admitting: Cardiology

## 2009-03-12 ENCOUNTER — Inpatient Hospital Stay (HOSPITAL_COMMUNITY): Admission: EM | Admit: 2009-03-12 | Discharge: 2009-03-13 | Payer: Self-pay | Admitting: Emergency Medicine

## 2009-03-13 ENCOUNTER — Encounter: Payer: Self-pay | Admitting: Cardiology

## 2009-03-13 ENCOUNTER — Encounter (INDEPENDENT_AMBULATORY_CARE_PROVIDER_SITE_OTHER): Payer: Self-pay | Admitting: *Deleted

## 2009-03-13 LAB — CONVERTED CEMR LAB
BUN: 8 mg/dL
CK-MB: 5.3 ng/mL
CO2: 27 meq/L
Chloride: 108 meq/L
Creatinine, Ser: 0.8 mg/dL
GFR calc non Af Amer: >60 mL/min
Glomerular Filtration Rate, Af Am: >60 mL/min/{1.73_m2}
Glucose, Bld: 196 mg/dL
HCT: 35.3 %
Hemoglobin: 12.3 g/dL
Hgb A1c MFr Bld: 10.1 %
MCV: 87.2 fL
Platelets: 118 10*3/uL
Potassium: 3.6 meq/L
Sodium: 139 meq/L
Total CK: 215 units/L
Troponin I: 0.02 ng/mL
WBC: 5.4 10*3/uL

## 2009-03-26 ENCOUNTER — Encounter (INDEPENDENT_AMBULATORY_CARE_PROVIDER_SITE_OTHER): Payer: Self-pay | Admitting: *Deleted

## 2009-03-26 DIAGNOSIS — E785 Hyperlipidemia, unspecified: Secondary | ICD-10-CM | POA: Insufficient documentation

## 2009-03-26 DIAGNOSIS — I2510 Atherosclerotic heart disease of native coronary artery without angina pectoris: Secondary | ICD-10-CM | POA: Insufficient documentation

## 2009-03-26 DIAGNOSIS — F17200 Nicotine dependence, unspecified, uncomplicated: Secondary | ICD-10-CM | POA: Insufficient documentation

## 2009-04-02 ENCOUNTER — Ambulatory Visit: Payer: Self-pay | Admitting: Endocrinology

## 2009-04-02 DIAGNOSIS — J45909 Unspecified asthma, uncomplicated: Secondary | ICD-10-CM | POA: Insufficient documentation

## 2009-04-02 LAB — CONVERTED CEMR LAB
Fructosamine: 374 micromoles/L — ABNORMAL HIGH (ref <285)
Testosterone: 386.4 ng/dL (ref 350.00–890.00)

## 2009-04-28 ENCOUNTER — Encounter: Payer: Self-pay | Admitting: Internal Medicine

## 2009-04-30 ENCOUNTER — Ambulatory Visit: Payer: Self-pay | Admitting: Cardiology

## 2009-04-30 ENCOUNTER — Encounter (INDEPENDENT_AMBULATORY_CARE_PROVIDER_SITE_OTHER): Payer: Self-pay | Admitting: *Deleted

## 2009-05-05 ENCOUNTER — Ambulatory Visit: Payer: Self-pay | Admitting: Internal Medicine

## 2009-05-06 ENCOUNTER — Encounter: Payer: Self-pay | Admitting: Cardiology

## 2009-05-06 ENCOUNTER — Encounter (INDEPENDENT_AMBULATORY_CARE_PROVIDER_SITE_OTHER): Payer: Self-pay | Admitting: *Deleted

## 2009-05-06 LAB — CONVERTED CEMR LAB
Cholesterol: 147 mg/dL
Cholesterol: 147 mg/dL (ref 0–200)
HDL: 28 mg/dL
HDL: 28 mg/dL — ABNORMAL LOW (ref >39)
LDL Cholesterol: 89 mg/dL
LDL Cholesterol: 89 mg/dL (ref 0–99)
Total CHOL/HDL Ratio: 5.3
Triglycerides: 149 mg/dL
Triglycerides: 149 mg/dL (ref <150)
VLDL: 30 mg/dL (ref 0–40)

## 2009-05-14 ENCOUNTER — Ambulatory Visit: Payer: Self-pay | Admitting: Cardiology

## 2009-05-19 ENCOUNTER — Encounter (INDEPENDENT_AMBULATORY_CARE_PROVIDER_SITE_OTHER): Payer: Self-pay | Admitting: *Deleted

## 2009-05-20 ENCOUNTER — Encounter: Payer: Self-pay | Admitting: Internal Medicine

## 2009-05-20 ENCOUNTER — Encounter (INDEPENDENT_AMBULATORY_CARE_PROVIDER_SITE_OTHER): Payer: Self-pay | Admitting: *Deleted

## 2009-07-02 ENCOUNTER — Ambulatory Visit: Payer: Self-pay | Admitting: Endocrinology

## 2009-07-04 ENCOUNTER — Ambulatory Visit: Payer: Self-pay | Admitting: Endocrinology

## 2009-07-30 ENCOUNTER — Ambulatory Visit: Payer: Self-pay | Admitting: Internal Medicine

## 2009-08-19 ENCOUNTER — Encounter (INDEPENDENT_AMBULATORY_CARE_PROVIDER_SITE_OTHER): Payer: Self-pay | Admitting: *Deleted

## 2009-08-27 ENCOUNTER — Ambulatory Visit: Payer: Self-pay | Admitting: Cardiology

## 2009-09-05 ENCOUNTER — Telehealth (INDEPENDENT_AMBULATORY_CARE_PROVIDER_SITE_OTHER): Payer: Self-pay

## 2009-09-22 ENCOUNTER — Telehealth (INDEPENDENT_AMBULATORY_CARE_PROVIDER_SITE_OTHER): Payer: Self-pay | Admitting: *Deleted

## 2009-10-17 ENCOUNTER — Ambulatory Visit: Payer: Self-pay | Admitting: Endocrinology

## 2009-10-20 LAB — CONVERTED CEMR LAB: Testosterone: 448.3 ng/dL (ref 350.00–890.00)

## 2009-10-24 ENCOUNTER — Ambulatory Visit: Payer: Self-pay | Admitting: Endocrinology

## 2009-10-24 LAB — CONVERTED CEMR LAB: Hgb A1c MFr Bld: 11.2 % — ABNORMAL HIGH (ref 4.6–6.5)

## 2009-11-05 ENCOUNTER — Ambulatory Visit: Payer: Self-pay | Admitting: Internal Medicine

## 2009-11-11 ENCOUNTER — Encounter: Payer: Self-pay | Admitting: Internal Medicine

## 2009-11-14 ENCOUNTER — Ambulatory Visit (HOSPITAL_COMMUNITY): Admission: RE | Admit: 2009-11-14 | Discharge: 2009-11-14 | Payer: Self-pay | Admitting: Family Medicine

## 2010-01-26 ENCOUNTER — Ambulatory Visit: Payer: Self-pay | Admitting: Endocrinology

## 2010-01-26 LAB — CONVERTED CEMR LAB: Hgb A1c MFr Bld: 10.1 % — ABNORMAL HIGH (ref 4.6–6.5)

## 2010-02-05 ENCOUNTER — Encounter: Admission: RE | Admit: 2010-02-05 | Discharge: 2010-02-05 | Payer: Self-pay | Admitting: Orthopaedic Surgery

## 2010-02-06 ENCOUNTER — Encounter: Payer: Self-pay | Admitting: Internal Medicine

## 2010-02-26 ENCOUNTER — Ambulatory Visit: Payer: Self-pay | Admitting: Internal Medicine

## 2010-02-26 ENCOUNTER — Ambulatory Visit (HOSPITAL_COMMUNITY): Admission: RE | Admit: 2010-02-26 | Discharge: 2010-02-26 | Payer: Self-pay | Admitting: Family Medicine

## 2010-03-02 ENCOUNTER — Telehealth: Payer: Self-pay | Admitting: Endocrinology

## 2010-03-06 ENCOUNTER — Encounter (INDEPENDENT_AMBULATORY_CARE_PROVIDER_SITE_OTHER): Payer: Self-pay | Admitting: *Deleted

## 2010-04-02 DIAGNOSIS — Z9889 Other specified postprocedural states: Secondary | ICD-10-CM

## 2010-04-02 HISTORY — DX: Other specified postprocedural states: Z98.890

## 2010-04-05 ENCOUNTER — Inpatient Hospital Stay (HOSPITAL_COMMUNITY): Admission: EM | Admit: 2010-04-05 | Discharge: 2010-04-08 | Payer: Self-pay | Source: Home / Self Care

## 2010-04-06 ENCOUNTER — Encounter: Payer: Self-pay | Admitting: Cardiology

## 2010-04-08 ENCOUNTER — Encounter: Payer: Self-pay | Admitting: Internal Medicine

## 2010-04-09 ENCOUNTER — Encounter: Payer: Self-pay | Admitting: Internal Medicine

## 2010-04-10 ENCOUNTER — Encounter: Payer: Self-pay | Admitting: Internal Medicine

## 2010-04-10 ENCOUNTER — Encounter (INDEPENDENT_AMBULATORY_CARE_PROVIDER_SITE_OTHER): Payer: Self-pay | Admitting: *Deleted

## 2010-04-13 ENCOUNTER — Encounter (INDEPENDENT_AMBULATORY_CARE_PROVIDER_SITE_OTHER): Payer: Self-pay

## 2010-04-21 ENCOUNTER — Telehealth: Payer: Self-pay | Admitting: Cardiology

## 2010-05-07 ENCOUNTER — Telehealth: Payer: Self-pay | Admitting: Internal Medicine

## 2010-05-15 ENCOUNTER — Encounter (INDEPENDENT_AMBULATORY_CARE_PROVIDER_SITE_OTHER): Payer: Self-pay | Admitting: *Deleted

## 2010-05-24 ENCOUNTER — Encounter: Payer: Self-pay | Admitting: Orthopaedic Surgery

## 2010-05-25 ENCOUNTER — Ambulatory Visit: Admit: 2010-05-25 | Payer: Self-pay | Admitting: Endocrinology

## 2010-05-26 ENCOUNTER — Encounter: Payer: Self-pay | Admitting: Cardiology

## 2010-05-28 ENCOUNTER — Encounter: Payer: Self-pay | Admitting: Internal Medicine

## 2010-05-28 ENCOUNTER — Ambulatory Visit
Admission: RE | Admit: 2010-05-28 | Discharge: 2010-05-28 | Payer: Self-pay | Source: Home / Self Care | Attending: Internal Medicine | Admitting: Internal Medicine

## 2010-06-02 NOTE — Progress Notes (Signed)
Summary: LEVEMIR  Phone Note From Pharmacy   Caller: [facility]* Summary of Call: Pharm Called: Insurance will only cover 3 vials of levemir every 30 days. There is no option for PA. The 4 additional bottle pt needs will cost 400$.  Initial call taken by: Lamar Sprinkles, CMA,  January 21, 2009 9:34 AM  Follow-up for Phone Call        please call pharmacy.  does lantus have this same limitation? Follow-up by: Minus Breeding MD,  January 21, 2009 12:38 PM  Additional Follow-up for Phone Call Additional follow up Details #1::        insurance will NOT cover the 4th vial of any insulin per pharmacist. please advise Additional Follow-up by: Margaret Pyle, CMA,  January 21, 2009 2:22 PM    Additional Follow-up for Phone Call Additional follow up Details #2::    i called pt 01/21/09.  we discussed options.  pt wants to continue his current insulin.  he will return next week.  i'll try to give him some samples then. Follow-up by: Minus Breeding MD,  January 21, 2009 3:50 PM

## 2010-06-02 NOTE — Miscellaneous (Signed)
Summary: labs tsh,cmp,lipids,liver,08/10/2007,11/27/2007  Clinical Lists Changes  Observations: Added new observation of CALCIUM: 8. mg/dL (16/02/9603 5:40) Added new observation of ALBUMIN: 3.9 g/dL (98/03/9146 8:29) Added new observation of PROTEIN, TOT: 6.0 g/dL (56/21/3086 5:78) Added new observation of SGPT (ALT): 32 units/L (08/09/2008 8:34) Added new observation of SGOT (AST): 25 units/L (08/09/2008 8:34) Added new observation of ALK PHOS: 57 units/L (08/09/2008 8:34) Added new observation of CREATININE: 0.95 mg/dL (46/96/2952 8:41) Added new observation of BUN: 13 mg/dL (32/44/0102 7:25) Added new observation of BG RANDOM: 273 mg/dL (36/64/4034 7:42) Added new observation of CO2 PLSM/SER: 26 meq/L (08/09/2008 8:34) Added new observation of CL SERUM: 105 meq/L (08/09/2008 8:34) Added new observation of K SERUM: 4.2 meq/L (08/09/2008 8:34) Added new observation of NA: 141 meq/L (08/09/2008 8:34) Added new observation of TSH: 1.768 microintl units/mL (11/27/2007 8:34) Added new observation of CALCIUM: 8.9 mg/dL (59/56/3875 6:43) Added new observation of ALBUMIN: 3.9 g/dL (32/95/1884 1:66) Added new observation of PROTEIN, TOT: 6.0 g/dL (10/31/1599 0:93) Added new observation of SGPT (ALT): 32 units/L (08/10/2007 8:34) Added new observation of SGOT (AST): 25 units/L (08/10/2007 8:34) Added new observation of ALK PHOS: 57 units/L (08/10/2007 8:34) Added new observation of CREATININE: 0.95 mg/dL (23/55/7322 0:25) Added new observation of BUN: 13 mg/dL (42/70/6237 6:28) Added new observation of BG RANDOM: 273 mg/dL (31/51/7616 0:73) Added new observation of CO2 PLSM/SER: 26 meq/L (08/10/2007 8:34) Added new observation of CL SERUM: 105 meq/L (08/10/2007 8:34) Added new observation of K SERUM: 4.2 meq/L (08/10/2007 8:34) Added new observation of NA: 141 meq/L (08/10/2007 8:34) Added new observation of LDL: 83 mg/dL (71/10/2692 8:54) Added new observation of HDL: 33 mg/dL (62/70/3500  9:38) Added new observation of TRIGLYC TOT: 177 mg/dL (18/29/9371 6:96) Added new observation of CHOLESTEROL: 151 mg/dL (78/93/8101 7:51)

## 2010-06-02 NOTE — Progress Notes (Signed)
Summary: LOST PILLS   Phone Note Call from Patient Call back at Work Phone [phone]   Caller: PT Reason for Call: Refill Medication Summary of Call: S: PT HAS LOST NORVASC 5MG  CAN WE CALL HIM IN ANOTHER RX. [facility]. Initial call taken by: Faythe Ghee,  Sep 05, 2009 11:23 AM  Follow-up for Phone Call        Pt. advised that his pharmacy may not cover a refill this soon. Pt. states he understands and is prepared to pay for meds. [facility]. notified and will refill today. Follow-up by: Larita Fife Via LPN,  Sep 06, 8655 1:16 PM

## 2010-06-02 NOTE — Assessment & Plan Note (Signed)
Summary: FU-$50-STC   Vital Signs:  Patient Profile:   65 Years Old Male Weight:      246.6 pounds Temp:     97.7 degrees F oral Pulse rate:   80 / minute BP sitting:   152 / 93  (right arm) Cuff size:   large  Vitals Entered By: Orlan Leavens (August 29, 2007 9:19 AM)                 Visit Type:  Follow-up Visit PCP:  luking  Chief Complaint:  dm.  History of Present Illness: pt says has occ mild hypoglycemia at hs.  pt says cbg's highest in afternoon (high-100's).    Current Allergies: ! PENICILLIN  Past Medical History:    Reviewed history from 03/06/2007 and no changes required:       Allergic rhinitis       Coronary artery disease       Depression       Diabetes mellitus, type II       GERD       Hypertension       Benign prostatic hypertrophy       sleep study was recently negative for sleep apnea, per the patient's report.     Review of Systems  The patient denies syncope.     Physical Exam  General:     obese.   Pulses:     dorsalis pedis intact bilat.  Extremities:     no deformity.  no ulcer on the feet.  feet are of normal color and temp. 1+ left pedal edema and 1+ right pedal edema.   Neurologic:     sensation is intact to touch on the feet  Additional Exam:     MICROALBUMIN/CREATININE RATIO                             14.1 mg/g                   0.0-30.0  Tests: (2) HGB A1C (A1C)   A1C%                 [H]  9.9 %     Impression & Recommendations:  Problem # 1:  DIABETES MELLITUS, TYPE II (ICD-250.00) Assessment: Unchanged this pt may do better with a simpler regimen His updated medication list for this problem includes:    Humalog 100 Unit/ml Soln (Insulin lispro (human)) ..... See office notes    Lantus 100 Unit/ml Soln (Insulin glargine) ..... See office notes    Metformin Hcl 1000 Mg Tabs (Metformin hcl) .Marland Kitchen... Take 1 by mouth two times a day qd  Orders: TLB-A1C / Hgb A1C (Glycohemoglobin) (83036-A1C) TLB-Microalbumin/Creat  Ratio, Urine (82043-MALB) Est. Patient Level III ([address])    Patient Instructions: 1)  same lantus (50/d) 2)  change humalog to 20-10-30-(and 5 if hs snack) 3)  ret 3 mos 4)  i called pt with a1c result and offered to change to simpler insulin regimen 5)  cc dr Gerda Diss    ]  Appended Document: FU-$50-STC FAXED NOTES TO DR Gerda Diss @ [phone]/LMB

## 2010-06-02 NOTE — Assessment & Plan Note (Signed)
Summary: 3 MTH FU  STC   Vital Signs:  Patient profile:   65 year old male Height:      69 inches (175.26 cm) Weight:      229.13 pounds (104.15 kg) BMI:     33.96 O2 Sat:      95 % on Room air Temp:     98.8 degrees F (37.11 degrees C) oral Pulse rate:   80 / minute BP sitting:   128 / 78 Cuff size:   large  Vitals Entered By: Brenton Grills MA (January 26, 2010 8:19 AM)  O2 Flow:  Room air CC: 3 month F/U/aj Is Patient Diabetic? Yes   Primary Provider:  Dr.Scott Luking  CC:  3 month F/U/aj.  History of Present Illness: pt states cbg's are elevated due to po steriods given last week for neck pain.  he brings a record of his cbg's which i have reviewed today.  it varies from 73 (lunch was delayed) to 348 (right after he started steriod pack).  prior to the steriods, he had to reduce his breakfast insulin due to mild hypoglycemia after that meal.     Allergies: 1)  ! Penicillin  Past History:  Past Medical History: Last updated: 03/26/2009 ASCVD: 70% mid left anterior descending lesion on cath in 06/1995; left anterior descending DES placed in 8/03 and RCA stent in 9/03; captain 3/05 revealed 90% second marginal for which PCI was performed, 70% PDA and total obstruction of the first diagonal and marginal; sudden cardiac death in [place] a casino in 10/2003 for which automatic implantable cardiac defibrillator placed; negative stress nuclear study in 01/2006 with ejection fraction of 52% ________________________________________ Hypertension Hyperlipidemia Tobacco abuse: 100-pack-year consumption; cigarettes discontinued 2003; all tobacco products in 2008 Cerebrovascular disease: Transient ischemic attack in 05/2008; carotid ultrasound-plaque without focal disease AODM-insulin requirement Degenerative joint disease-C-spine fusion in 2002 Erectile dysfunction Anxiety and depression Benign prostatic hypertrophy sleep study was recently negative for sleep apnea, per the  patient's report. HYPOGONADISM, MALE (ICD-257.2) BENIGN PROSTATIC HYPERTROPHY (ICD-600.00) GERD (ICD-530.81) DIABETES MELLITUS, TYPE II (ICD-250.00) DEPRESSION (ICD-311) CORONARY ARTERY DISEASE (ICD-414.00) ALLERGIC RHINITIS (ICD-477.9)  Review of Systems  The patient denies syncope.    Physical Exam  General:  obese.  no distress  Skin:  insulin injection sites at anterior abdomen are normal  Additional Exam:  Hemoglobin A1C       [H]  10.1 %             Impression & Recommendations:  Problem # 1:  DIABETES MELLITUS, TYPE II (ICD-250.00) exac by steriods  Medications Added to Medication List This Visit: 1)  Humulin 70/30 70-30 % Susp (Insulin isophane & regular) .Marland KitchenMarland KitchenMarland Kitchen 140 units am and 50 units pm  Other Orders: Flu Vaccine 35yrs + MEDICARE PATIENTS (Z6109) Administration Flu vaccine - MCR (G0008) TLB-A1C / Hgb A1C (Glycohemoglobin) (83036-A1C) Est. Patient Level III ([address])  Patient Instructions: 1)  blood tests are being ordered for you today.  please call [phone] to hear your test results. 2)  pending the test results, please reduce humulin 70/30, to 140 units am and 50 units before the evening meal. 3)  Please schedule a follow-up appointment in 3-4 months. 4)  check your blood sugar 2 times a day.  vary the time of day when you check, between before the 3 meals, and at bedtime.  also check if you have symptoms of your blood sugar being too high or too low.  please keep a record of the  readings and bring it to your next appointment here.  please call us sooner if you are having low blood sugar episodes.   5)  (update: i left message on phone-tree:  rx as we discussed)            Flu Vaccine Consent Questions     Do you have a history of severe allergic reactions to this vaccine? no    Any prior history of allergic reactions to egg and/or gelatin? no    Do you have a sensitivity to the preservative Thimersol? no    Do you have a past history of Guillan-Barre  Syndrome? no    Do you currently have an acute febrile illness? no    Have you ever had a severe reaction to latex? no    Vaccine information given and explained to patient? yes    Are you currently pregnant? no    Lot Number:[id number]   Exp Date:10/31/2010   Site Given Right Deltoid IMlu

## 2010-06-02 NOTE — Assessment & Plan Note (Signed)
Summary: 6 WK FU---$50---STC   Vital Signs:  Patient Profile:   65 Years Old Male Weight:      240.6 pounds O2 Sat:      94 % O2 treatment:    Room Air Temp:     97.8 degrees F oral Pulse rate:   102 / minute BP sitting:   134 / 72  (left arm) Cuff size:   regular  Pt. in pain?   no  Vitals Entered By: Orlan Leavens (March 01, 2008 2:06 PM)                  PCP:  luking  Chief Complaint:  6 week follow-up.  History of Present Illness: pt says his cbg's are well-controlled.  he had only 1 episode of hypoglycemia, and this was after taking extra insulin for a piece of cake.  he brings a record of his cbg's which i have reviewed today.  he increase his humalog 50/50 to 60 units am and 50 units pm.  on this schedule, cbg's are 84-257.  it is highest at hs and am than lunch and afternoon. viagra does not help ed sxs.     Prior Medications Reviewed Using: Patient Recall  Updated Prior Medication List: NORVASC 5 MG  TABS (AMLODIPINE BESYLATE) take 1/2 tab by mouth qd ASPIRIN 325 MG  TBEC (ASPIRIN) take 1 by mouth qd CELEXA 40 MG  TABS (CITALOPRAM HYDROBROMIDE) take 1 by mouth qd FISH OIL 1000 MG  CAPS (OMEGA-3 FATTY ACIDS) take 1 by mouth qd FEXOFENADINE HCL 180 MG  TABS (FEXOFENADINE HCL) take 1 by mouth qd LISINOPRIL 5 MG  TABS (LISINOPRIL) take 1/2  by mouth once daily METFORMIN HCL 1000 MG  TABS (METFORMIN HCL) take 1 by mouth two times a day qd METOPROLOL SUCCINATE 100 MG  TB24 (METOPROLOL SUCCINATE) take 1 by mouth qd PLAVIX 75 MG  TABS (CLOPIDOGREL BISULFATE) take 1 by mouth qd PREVACID 30 MG  CPDR (LANSOPRAZOLE) take 1 by mouth qd VITAMIN B-12 CR 1000 MCG  TBCR (CYANOCOBALAMIN) take 1 by mouth qd ALPRAZOLAM 1 MG  TABS (ALPRAZOLAM) take 1 by mouth qd HYDROCODONE-ACETAMINOPHEN 7.5-325 MG  TABS (HYDROCODONE-ACETAMINOPHEN) take 1 by mouth q 4-6 hours prn CLOMID 50 MG  TABS (CLOMIPHENE CITRATE) 1/4 tab three times weekly (m,w,f) NEXIUM 40 MG CPDR (ESOMEPRAZOLE MAGNESIUM)  Take 1 capsule by mouth twice a day FLOMAX 0.4 MG  XR24H-CAP (TAMSULOSIN HCL)  HUMALOG MIX 50/50 KWIKPEN 50-50 % SUSP (INSULIN LISPRO PROT & LISPRO) 55 units qam    40 units qpm LEVAQUIN 500 MG TABS (LEVOFLOXACIN) take 1 by mouth once daily for 10 days  Current Allergies (reviewed today): ! PENICILLIN  Past Medical History:    Reviewed history from 12/07/2007 and no changes required:       Benign prostatic hypertrophy       sleep study was recently negative for sleep apnea, per the patient's report.       HYPOGONADISM, MALE (ICD-257.2)       BENIGN PROSTATIC HYPERTROPHY (ICD-600.00)       HYPERTENSION (ICD-401.9)       GERD (ICD-530.81)       DIABETES MELLITUS, TYPE II (ICD-250.00)       DEPRESSION (ICD-311)       CORONARY ARTERY DISEASE (ICD-414.00)       ALLERGIC RHINITIS (ICD-477.9)     Review of Systems       The patient complains of weight gain.  The patient denies syncope.  Physical Exam  General:     well developed, well nourished, in no acute distress Psych:     alert and cooperative; normal mood and affect; normal attention span and concentration Additional Exam:     A1C%                 [H]  8.5 %     Impression & Recommendations:  Problem # 1:  DIABETES MELLITUS, TYPE II (ICD-250.00)  Problem # 2:  ERECTILE DYSFUNCTION, ORGANIC (ICD-607.84)  Medications Added to Medication List This Visit: 1)  Humalog Mix 50/50 Kwikpen 50-50 % Susp (Insulin lispro prot & lispro) .... 55 units bid 2)  Levaquin 500 Mg Tabs (Levofloxacin) .... Take 1 by mouth once daily for 10 days   Patient Instructions: 1)  change humalog 50/50 to 55 units 2 x a day 2)  i gave samples of cialis 10 mg--take 2 tabs prn 3)  ret 3 mos   ]

## 2010-06-02 NOTE — Miscellaneous (Signed)
Summary: LIPID PROFILE 05/06/2009  Clinical Lists Changes  Observations: Added new observation of LDL: 89 mg/dL (44/05/270 53:66) Added new observation of HDL: 28 mg/dL (44/07/4740 59:56) Added new observation of TRIGLYC TOT: 149 mg/dL (38/75/6433 29:51) Added new observation of CHOLESTEROL: 147 mg/dL (88/41/6606 30:16)

## 2010-06-02 NOTE — Assessment & Plan Note (Signed)
Summary: 6-8 WK FU-STC  RSC'D APPT DUE TO HOLIDAY LUNCH/NML  Medications Added NEXIUM 40 MG CPDR (ESOMEPRAZOLE MAGNESIUM) Take 1 capsule by mouth twice a day        Vital Signs:  Patient Profile:   65 Years Old Male Weight:      242.4 pounds Temp:     97.9 degrees F oral Pulse rate:   86 / minute BP sitting:   154 / 87  (right arm) Cuff size:   large  Vitals Entered By: Orlan Leavens (May 02, 2007 10:29 AM)                 Visit Type:  Follow-up Visit PCP:  luking  Chief Complaint:  dm.  History of Present Illness: he feels no different since he has been taking Clomid, one fourth of a 50-mg pill, Monday, Wednesday, and Friday. for his diabetes, he states it was well controlled until he got a steroid injection in his left knee.  He brings with him and extensive record of his glucoses which I have reviewed today.  Most glucoses are in the 100s, except he has some 200s before supper, and at bedtime.   Current Allergies: ! PENICILLIN  Past Medical History:    Reviewed history from 03/06/2007 and no changes required:       Allergic rhinitis       Coronary artery disease       Depression       Diabetes mellitus, type II       GERD       Hypertension       Benign prostatic hypertrophy       sleep study was recently negative for sleep apnea, per the patient's report.     Review of Systems       he denies hypoglycemia, but he does have some weight gain.   Physical Exam  General:     obese.   Genitalia:     normal male, testes descended bilaterally without masses, no hernias noted Additional Exam:     a1c=9.0 testosterone=440    Impression & Recommendations:  Problem # 1:  DIABETES MELLITUS, TYPE II (ICD-250.00)  His updated medication list for this problem includes:    Humalog 100 Unit/ml Soln (Insulin lispro (human)) ..... See office notes    Lantus 100 Unit/ml Soln (Insulin glargine) ..... See office notes    Metformin Hcl 1000 Mg Tabs (Metformin  hcl) .Marland Kitchen... Take 1 by mouth two times a day qd  Orders: TLB-A1C / Hgb A1C (Glycohemoglobin) (83036-A1C) Est. Patient Level III (664[address])   Problem # 2:  HYPOGONADISM, MALE (ICD-257.2) well-controlled on clomid Orders: TLB-Testosterone, Total (84403-TESTO)   Medications Added to Medication List This Visit: 1)  Nexium 40 Mg Cpdr (Esomeprazole magnesium) .... Take 1 capsule by mouth twice a day   Patient Instructions: 1)  increase Humalog to 20 with breakfast, 50, with lunch, and 35 with supper. 2)  continue Lantus, 50 units nightly. 3)  same clomid 4)  Return 6 weeks    ]  Appended Document: 6-8 WK FU-STC  RSC'D APPT DUE TO HOLIDAY LUNCH/NML Faxed notes to dr. Cathlyn Parsons @ [phone]/LMB

## 2010-06-02 NOTE — Assessment & Plan Note (Signed)
Summary: 2 WK FU  $50  STC   Vital Signs:  Patient profile:   65 year old male Height:      65 inches Weight:      234 pounds BMI:     39.08 O2 Sat:      97 % on Room air Temp:     97.0 degrees F oral Pulse rate:   82 / minute BP sitting:   116 / 72  (left arm) Cuff size:   large  Vitals Entered By: Ami Bullins CMA (January 21, 2009 8:08 AM)  O2 Flow:  Room air CC: follow-up visit, pt CBGs running mid 100's to mid 200's with no pattern/ pt will get TD booster today/ab   Primary Provider:  luking  CC:  follow-up visit and pt CBGs running mid 100's to mid 200's with no pattern/ pt will get TD booster today/ab.  History of Present Illness: cbg's vary from 131-290.  it is in general higher as the day goes on.  pt states he feels well in general, except as noted below. pt states 1 week of swelling at the left great toe, but no associated pain.  Current Medications (verified): 1)  Norvasc 5 Mg  Tabs (Amlodipine Besylate) .... Take 1 Tab By Mouth Qd 2)  Aspirin 325 Mg  Tbec (Aspirin) .... Take 1 By Mouth Qd 3)  Celexa 40 Mg  Tabs (Citalopram Hydrobromide) .... Take 1 By Mouth Qd 4)  Fish Oil 1000 Mg  Caps (Omega-3 Fatty Acids) .... Take 1 By Mouth Qd 5)  Metformin Hcl 1000 Mg  Tabs (Metformin Hcl) .... Take 1 By Mouth Two Times A Day Qd 6)  Metoprolol Succinate 100 Mg  Tb24 (Metoprolol Succinate) .... Take 1 By Mouth Qd 7)  Plavix 75 Mg  Tabs (Clopidogrel Bisulfate) .... Take 1 By Mouth Qd 8)  Vitamin B-12 Cr 1000 Mcg  Tbcr (Cyanocobalamin) .... Take 1 By Mouth Qd 9)  Alprazolam 1 Mg  Tabs (Alprazolam) .... Take 1 At Bedtime Prn 10)  Hydrocodone-Acetaminophen 7.5-325 Mg  Tabs (Hydrocodone-Acetaminophen) .... Take 1 By Mouth Q 4-6 Hours Prn 11)  Clomid 50 Mg  Tabs (Clomiphene Citrate) .... 1/4 Tab Qd 12)  Omeprazole 40 Mg Cpdr (Omeprazole) .... Take 1 By Mouth Qd 13)  Doxazosin Mesylate 4 Mg Tabs (Doxazosin Mesylate) .... Take 1 By Mouth Qd 14)  Lipitor 80 Mg Tabs (Atorvastatin  Calcium) .... Take 1 By Mouth Qd 15)  Aggrenox 25-200 Mg Xr12h-Cap (Aspirin-Dipyridamole) .... Take 2 By Mouth Once Daily For 2 Weeks Then Start Back in Plavix & Asprin 16)  Alprazolam 0.25 Mg Tbdp (Alprazolam) .... Take 1 By Mouth Qd 17)  Unifine Pentips 31g X 8 Mm Misc (Insulin Pen Needle) .... Use To Administer Humalog Injection Three Times A Day 18)  Ventolin Hfa 108 (90 Base) Mcg/act Aers (Albuterol Sulfate) .... 2 Puffs Q4h As Needed 19)  Nasonex 50 Mcg/act Susp (Mometasone Furoate) .... 2 Sprays Each Nostril Qam 20)  Aggrenox 25-200 Mg Xr12h-Cap (Aspirin-Dipyridamole) .Marland Kitchen.. 1 Tab Two Times A Day 21)  Fexofenadine Hcl 180 Mg Tabs (Fexofenadine Hcl) .Marland Kitchen.. 1 Daily 22)  Fluticasone Propionate 50 Mcg/act Susp (Fluticasone Propionate) .Marland Kitchen.. 1 Daily 23)  Cialis 20 Mg Tabs (Tadalafil) .... For As Needed Use 24)  Sulfamethoxazole-Trimethoprim 400-80 Mg/28ml Soln (Sulfamethoxazole-Trimethoprim) .Marland Kitchen.. 1 Tab Two Times A Day 25)  Levemir 100 Unit/ml Soln (Insulin Detemir) .Marland KitchenMarland KitchenMarland Kitchen 180 Units Qam  Allergies (verified): 1)  ! Penicillin  Past History:  Past Medical History:  Last updated: 12/07/2007 Benign prostatic hypertrophy sleep study was recently negative for sleep apnea, per the patient's report. HYPOGONADISM, MALE (ICD-257.2) BENIGN PROSTATIC HYPERTROPHY (ICD-600.00) HYPERTENSION (ICD-401.9) GERD (ICD-530.81) DIABETES MELLITUS, TYPE II (ICD-250.00) DEPRESSION (ICD-311) CORONARY ARTERY DISEASE (ICD-414.00) ALLERGIC RHINITIS (ICD-477.9)  Review of Systems  The patient denies hypoglycemia.         no change in chronic numbness of the toes  Physical Exam  General:  normal appearance.   Pulses:  dorsalis pedis intact bilat.  Extremities:  no deformity.  no ulcer on the feet.  feet are of normal color and temp.  no edema.  there is slight ecchymoses at the left great toenail  Neurologic:  sensation is intact to touch on the feet, but decreased from  normal.  Additional Exam:  he brings a  record of his cbg's which i have reviewed today.    Impression & Recommendations:  Problem # 1:  DIABETES MELLITUS, TYPE II (ICD-250.00) therapy is limited by his need for a simple regimen needs increased rx  Problem # 2:  toe swelling  Problem # 3:  numbness prob due to #1  Medications Added to Medication List This Visit: 1)  Levemir 100 Unit/ml Soln (Insulin detemir) .Marland KitchenMarland KitchenMarland Kitchen 190 units qam  Other Orders: Tdap => 50yrs IM (96295) Admin 1st Vaccine (28413) Est. Patient Level IV (24401)  Patient Instructions: 1)  increase levemir to 190 units each am.   2)  Please schedule a follow-up appointment in 6 weeks.  please bring med bottles so we can reconcile. 3)  please observe the left big toenail.  see me or dr Gerda Diss if and significant change. Prescriptions: LEVEMIR 100 UNIT/ML SOLN (INSULIN DETEMIR) 190 units qam  #7 vials x 11   Entered and Authorized by:   Minus Breeding MD   Signed by:   Minus Breeding MD on 01/21/2009   Method used:   Electronically to        [facility] (retail)       [address]       [place], [place]  [address]       Ph: [phone] or [phone]       Fax: [phone]   RxID:   [id number]     Immunizations Administered:  Tetanus Vaccine:    Vaccine Type: Tdap    Site: left deltoid    Mfr: [employer]Kline    Dose: 0.5 ml    Route: IM    Given by: Josph Macho    Exp. Date: 01/18/2011    Lot #: [id number]    VIS given: 03/21/07 version given January 21, 2009.

## 2010-06-02 NOTE — Letter (Signed)
Summary: Device-Delinquent Phone Journalist, newspaper, Main Office  [address]   [place], [place] [address]   Phone: [phone]  Fax: [phone]     February 21, 2009 MRN: [id number]   DAEKWON BESWICK [address] [place], [place]  [address]   Dear Mr. ALSIP,  According to our records, you were scheduled for a device phone transmission on February 17, 2009.     We did not receive any results from this check.  If you transmitted on your scheduled day, please call us to help troubleshoot your system.  If you forgot to send your transmission, please send one upon receipt of this letter.  Thank you,   [facility]

## 2010-06-02 NOTE — Assessment & Plan Note (Signed)
Summary: FU/$50/PN   Vital Signs:  Patient Profile:   65 Years Old Male Weight:      233.6 pounds Temp:     97.3 degrees F oral Pulse rate:   97 / minute BP sitting:   163 / 97  (right arm) Cuff size:   regular  Pt. in pain?   no  Vitals Entered By: Orlan Leavens (December 07, 2007 9:44 AM)                  PCP:  Gerda Diss  Chief Complaint:  FOLLOW-UP.  History of Present Illness: pt says his cbg's have been mostly 200's, with no pattern throughout the day.  feels "fine," however. pt says decreased libido and ed recently takes clomid as rx'ed    Current Allergies: ! PENICILLIN  Past Medical History:    Reviewed history from 03/06/2007 and no changes required:       Benign prostatic hypertrophy       sleep study was recently negative for sleep apnea, per the patient's report.       HYPOGONADISM, MALE (ICD-257.2)       BENIGN PROSTATIC HYPERTROPHY (ICD-600.00)       HYPERTENSION (ICD-401.9)       GERD (ICD-530.81)       DIABETES MELLITUS, TYPE II (ICD-250.00)       DEPRESSION (ICD-311)       CORONARY ARTERY DISEASE (ICD-414.00)       ALLERGIC RHINITIS (ICD-477.9)     Review of Systems  The patient denies hypoglycemia.     Physical Exam  General:     well developed, well nourished, in no acute distress Psych:     alert and cooperative; normal mood and affect; normal attention span and concentration Additional Exam:      TESTOSTERONE              584.63 ng/dL       Impression & Recommendations:  Problem # 1:  DIABETES MELLITUS, TYPE II (ICD-250.00)  Problem # 2:  HYPOGONADISM, MALE (ICD-257.2) Assessment: Improved  Problem # 3:  ERECTILE DYSFUNCTION, ORGANIC (ICD-607.84) not due to # 2  Medications Added to Medication List This Visit: 1)  Humalog Mix 75/25 Kwikpen 75-25 % Susp (Insulin lispro prot & lispro) .... 80 units qam and 40 qpm  Other Orders: TLB-IBC Pnl (Iron/FE;Transferrin) (83550-IBC)   Patient Instructions: 1)  increase humalog  75/25 to 80 units am and 40 pm 2)  i advised pt that metoprolol may be causing his ed 3)  ret 21d   Prescriptions: HUMALOG MIX 75/25 KWIKPEN 75-25 %  SUSP (INSULIN LISPRO PROT & LISPRO) 80 units qam and 40 qpm  #3 x 11   Entered and Authorized by:   Minus Breeding MD   Signed by:   Minus Breeding MD on 12/07/2007   Method used:   Electronically sent to ...       [facility]*       [phone]       [place], [place]  [address]       Ph: [phone]       Fax: [phone]   RxID:   [id number]  ]

## 2010-06-02 NOTE — Assessment & Plan Note (Signed)
Summary: 1 YR DEFIB CK PER CKOUT 07/03/08-DSF      Allergies Added:   Visit Type:  Follow-up   Current Medications (verified): 1)  Norvasc 5 Mg  Tabs (Amlodipine Besylate) .... Take 1 /2 Tab Daily 2)  Aspir-Low 81 Mg Tbec (Aspirin) .... Take 1 Tab Daily 3)  Fish Oil 1000 Mg  Caps (Omega-3 Fatty Acids) .... Take 1 By Mouth Qd 4)  Metformin Hcl 1000 Mg  Tabs (Metformin Hcl) .... Take 1 By Mouth Two Times A Day Qd 5)  Alprazolam 1 Mg  Tabs (Alprazolam) .... Take 1 At Bedtime Prn 6)  Hydrocodone-Acetaminophen 7.5-325 Mg  Tabs (Hydrocodone-Acetaminophen) .... Take As Needed 7)  Clomid 50 Mg  Tabs (Clomiphene Citrate) .... Take 1/2 Tab Daily 8)  Omeprazole 20 Mg Cpdr (Omeprazole) .... Take 1 Tab Daily 9)  Doxazosin Mesylate 4 Mg Tabs (Doxazosin Mesylate) .... Take 1 By Mouth Qd 10)  Lipitor 80 Mg Tabs (Atorvastatin Calcium) .... Take 1 By Mouth Qd 11)  Aggrenox 25-200 Mg Xr12h-Cap (Aspirin-Dipyridamole) .... Take 2 By Mouth Once Daily For 2 Weeks Then Start Back in Plavix & Asprin 12)  Fexofenadine Hcl 180 Mg Tabs (Fexofenadine Hcl) .Marland Kitchen.. 1 Daily 13)  Fluticasone Propionate 50 Mcg/act Susp (Fluticasone Propionate) .Marland Kitchen.. 1 Daily 14)  Tamsulosin Hcl 0.4 Mg Caps (Tamsulosin Hcl) .Marland Kitchen.. 1 Tab Qd 15)  Diabetic Shoes .Marland KitchenMarland Kitchen. 250.01 16)  Vitamin B-12 1000 Mcg Tabs (Cyanocobalamin) .... Take 1 Tab Daily 17)  Doxycycline Hyclate 100 Mg Tabs (Doxycycline Hyclate) .... Take 1 Tab Two Times A Day 18)  Citalopram Hydrobromide 40 Mg Tabs (Citalopram Hydrobromide) .... Take 1 Tab Daily 19)  Lisinopril 5 Mg Tabs (Lisinopril) .... Take One Tablet By Mouth Daily 20)  Humulin 70/30 70-30 % Susp (Insulin Isophane & Regular) .Marland KitchenMarland KitchenMarland Kitchen 140 Units Am and 45 Units Pm  Allergies (verified): 1)  ! Penicillin  Vital Signs:  Patient profile:   65 year old male Weight:      239 pounds Pulse rate:   103 / minute BP sitting:   135 / 78  (right arm)  Vitals Entered By: Dreama Saa, CNA (July 30, 2009 2:02 PM)    ICD  Specifications Following MD:  Lewayne Bunting, MD     ICD Vendor:  [name]     ICD Model Number:  [phone]     ICD Serial Number:  [id number] ICD DOI:  11/06/2003      Lead 1:    Location: RA     DOI: 11/06/2003     Model #: 1488TC     Serial #: [id number]     Status: active Lead 2:    Location: RV     DOI: 11/06/2003     Model #: 1580     Serial #: [id number]     Status: active  Indications::  Cardiac Arrest  Explantation Comments: Merlin  ICD Follow Up Remote Check?  No Battery Voltage:  2.57 V     Charge Time:  13.1 seconds     Underlying rhythm:  SR   ICD Device Measurements Atrium:  Amplitude: 3.0 mV, Impedance: 495 ohms, Threshold: 1.0 V at 0.4 msec Right Ventricle:  Amplitude: 12 mV, Impedance: 340 ohms, Threshold: 0.75 V at 0.4 msec  Episodes MS Episodes:  0     Percent Mode Switch:  0     Coumadin:  No Shock:  0     ATP:  0     Nonsustained:  0  Atrial Pacing:  <1%     Ventricular Pacing:  <1%  Brady Parameters Mode DDI     Lower Rate Limit:  40     PAV 225      Tachy Zones VF:  240     VT:  211     VT1:  182     Next Remote Date:  10/27/2009     Next Cardiology Appt Due:  07/02/2010 Tech Comments:  No parameter changes.  Device function normal.  Merlin transmissions every 3 months.  ROV 1 year Dr. Ladona Ridgel in [place]. Altha Harm, LPN  July 30, 2009 2:14 PM

## 2010-06-02 NOTE — Letter (Signed)
Summary: Device-Delinquent Phone Journalist, newspaper, Main Office  [address]   [place], [place] [address]   Phone: [phone]  Fax: [phone]     February 06, 2010 MRN: [id number]   BRASON BERTHELOT [address] [place], [place]  [address]   Dear Mr. FLEMMER,  According to our records, you were scheduled for a device phone transmission on 02-05-2010.     We did not receive any results from this check.  If you transmitted on your scheduled day, please call us to help troubleshoot your system.  If you forgot to send your transmission, please send one upon receipt of this letter.  Thank you,   Architectural technologist [facility]

## 2010-06-02 NOTE — Assessment & Plan Note (Signed)
Summary: 4 MTH F/U PER CHECKOUT ON 04/30/09/TG  Medications Added CLOMID 50 MG  TABS (CLOMIPHENE CITRATE) take 1/2 tab daily LISINOPRIL 10 MG TABS (LISINOPRIL) Take one tablet by mouth daily HUMULIN 70/30 70-30 % SUSP (INSULIN ISOPHANE & REGULAR) 140 units am and 50 units pm NITROSTAT 0.4 MG SUBL (NITROGLYCERIN) take as directed for chest pain PLAVIX 75 MG TABS (CLOPIDOGREL BISULFATE) Take one tablet by mouth daily      Allergies Added:   Visit Type:  Follow-up Primary Provider:  Dr.Scott Luking   History of Present Illness: Return visit as scheduled for continuing assessment and treatment of coronary disease, cardiovascular risk factors, a history of sudden death and lightheadedness with a history of syncope.  Mr. Meter has done well since I last saw him 5 months ago.  He felt fine until the past few days, when he has been experiencing mild lightheadedness.  He's had no chest discomfort, no dyspnea and no loss of consciousness.  He has been monitoring blood pressure with systolics generally between 125 and 160 and diastolics only occasionally exceeding 90.  A 24-hour blood pressure monitor yielded similar values.  He had generally well-controlled blood pressure, but occasional values above 170.  Diastolic pressure was 124 on a single reading, but otherwise not elevated.     Current Medications (verified): 1)  Norvasc 5 Mg  Tabs (Amlodipine Besylate) .... Take 1 /2 Tab Daily 2)  Aspir-Low 81 Mg Tbec (Aspirin) .... Take 1 Tab Daily 3)  Fish Oil 1000 Mg  Caps (Omega-3 Fatty Acids) .... Take 1 By Mouth Qd 4)  Metformin Hcl 1000 Mg  Tabs (Metformin Hcl) .... Take 1 By Mouth Two Times A Day Qd 5)  Alprazolam 1 Mg  Tabs (Alprazolam) .... Take 1 At Bedtime Prn 6)  Hydrocodone-Acetaminophen 7.5-325 Mg  Tabs (Hydrocodone-Acetaminophen) .... Take As Needed 7)  Clomid 50 Mg  Tabs (Clomiphene Citrate) .... Take 1/2 Tab Daily 8)  Omeprazole 20 Mg Cpdr (Omeprazole) .... Take 1 Tab Daily 9)   Doxazosin Mesylate 4 Mg Tabs (Doxazosin Mesylate) .... Take 1 By Mouth Qd 10)  Lipitor 80 Mg Tabs (Atorvastatin Calcium) .... Take 1 By Mouth Qd 11)  Fexofenadine Hcl 180 Mg Tabs (Fexofenadine Hcl) .Marland Kitchen.. 1 Daily 12)  Fluticasone Propionate 50 Mcg/act Susp (Fluticasone Propionate) .Marland Kitchen.. 1 Daily 13)  Tamsulosin Hcl 0.4 Mg Caps (Tamsulosin Hcl) .Marland Kitchen.. 1 Tab Qd 14)  Diabetic Shoes .Marland KitchenMarland Kitchen. 250.01 15)  Vitamin B-12 1000 Mcg Tabs (Cyanocobalamin) .... Take 1 Tab Daily 16)  Citalopram Hydrobromide 40 Mg Tabs (Citalopram Hydrobromide) .... Take 1 Tab Daily 17)  Lisinopril 10 Mg Tabs (Lisinopril) .... Take One Tablet By Mouth Daily 18)  Humulin 70/30 70-30 % Susp (Insulin Isophane & Regular) .Marland KitchenMarland KitchenMarland Kitchen 140 Units Am and 50 Units Pm 19)  Nitrostat 0.4 Mg Subl (Nitroglycerin) .... Take As Directed For Chest Pain 20)  Plavix 75 Mg Tabs (Clopidogrel Bisulfate) .... Take One Tablet By Mouth Daily  Allergies (verified): 1)  ! Penicillin  Past History:  PMH, FH, and Social History reviewed and updated.  Review of Systems       See history of present illness.  Vital Signs:  Patient profile:   65 year old male Weight:      236 pounds Pulse rate:   100 / minute Pulse (ortho):   102 / minute BP sitting:   140 / 78  (right arm) BP standing:   135 / 68  Vitals Entered By: Dreama Saa, CNA (August 27, 2009  2:26 PM)  Serial Vital Signs/Assessments:  Time      Position  BP       Pulse  Resp  Temp     By 3:17 PM   Lying RA  146/84   101                   Sandy Neugent, CNA 3:17 PM   Sitting   129/76   99                    Sandy Neugent, CNA 3:17 PM   Standing  175/88   102                   [employer], CNA   Physical Exam  General:    Well developed; no acute distress; overweight but muscular   Neck-No JVD; no carotid bruits: Lungs-No tachypnea, no rales; no rhonchi; no wheezes: Cardiovascular-normal PMI;split S1 and S2: modest systolic ejection murmur Abdomen-BS normal; soft and non-tender without  masses or organomegaly:  Musculoskeletal-No deformities, no cyanosis or clubbing: Neurologic-Normal cranial nerves; symmetric strength and tone:  Skin-Warm, no significant lesions: Extremities-Nl distal pulses; trace edema:     ICD Specifications Following MD:  Lewayne Bunting, MD     ICD Vendor:  [name]     ICD Model Number:  V234     ICD Serial Number:  [id number] ICD DOI:  11/06/2003      Lead 1:    Location: RA     DOI: 11/06/2003     Model #: 1488TC     Serial #: [id number]     Status: active Lead 2:    Location: RV     DOI: 11/06/2003     Model #: 1580     Serial #: [id number]     Status: active  Indications::  Cardiac Arrest  Explantation Comments: Merlin  Episodes Coumadin:  No  Brady Parameters Mode DDI     Lower Rate Limit:  40     PAV 225      Tachy Zones VF:  240     VT:  211     VT1:  182     Impression & Recommendations:  Problem # 1:  ATHEROSCLEROTIC CARDIOVASCULAR DISEASE (ICD-429.2) Coronary artery disease appears stable at the present time.  Management will focus on optimal control of cardiovascular risk factors.  Problem # 2:  HYPERTENSION (ICD-401.9) Control of hypertension remains suboptimal.  In the absence of any orthostatic change in blood pressure, lisinopril will be increased to 10 mg q.d.  Patient will continue to monitor blood pressure at home and call for significant elevations.  Problem # 3:  HYPERLIPIDEMIA (ICD-272.4) Lipid profile was excellent a few months ago, but HDL was low at 28 and LDL slightly elevated to 89.  Current medications will be continued.  Problem # 4:  TRANSIENT ISCHEMIC ATTACK (ICD-435.9) There has been no recurrence of neurologic symptoms.  My initial plan was to stop Aggrenox and restart Plavix, since the latter drug would provide benefits to the cardiovascular system as well as to the cerebral circulation.  Since he has preserved left ventricular systolic function, a low risk for cardiac of thromboembolism and no significant focal  carotid disease, the likelihood of a CVA is small.  I will plan to see this nice gentleman again in 6 months.  Patient Instructions: 1)  Your physician recommends that you schedule a follow-up appointment in: 6 months 2)  Your physician  has recommended you make the following change in your medication: stop aggrenox, restart plavix at 75mg  daily, increase lisinopril to 10mg  daily Prescriptions: LISINOPRIL 10 MG TABS (LISINOPRIL) Take one tablet by mouth daily  #30 x 3   Entered by:   Teressa Lower RN   Authorized by:   Kathlen Brunswick, MD, [facility]   Signed by:   Teressa Lower RN on 08/27/2009   Method used:   Electronically to        [facility]* (retail)       [address]       [place], [place]  [address]       Ph: [phone]       Fax: [phone]   RxID:   [id number] PLAVIX 75 MG TABS (CLOPIDOGREL BISULFATE) Take one tablet by mouth daily  #30 x 3   Entered by:   Teressa Lower RN   Authorized by:   Kathlen Brunswick, MD, [facility]   Signed by:   Teressa Lower RN on 08/27/2009   Method used:   Electronically to        [facility]* (retail)       [address]       [place], [place]  [address]       Ph: [phone]       Fax: [phone]   RxID:   [id number]

## 2010-06-02 NOTE — Assessment & Plan Note (Signed)
Summary: ROV  Medications Added NORVASC 5 MG  TABS (AMLODIPINE BESYLATE) take 1 /2 tab daily ASPIR-LOW 81 MG TBEC (ASPIRIN) take 1 tab daily HYDROCODONE-ACETAMINOPHEN 7.5-325 MG  TABS (HYDROCODONE-ACETAMINOPHEN) take as needed CLOMID 50 MG  TABS (CLOMIPHENE CITRATE) take 1/2 tab daily OMEPRAZOLE 20 MG CPDR (OMEPRAZOLE) take 1 tab daily LEVEMIR 100 UNIT/ML SOLN (INSULIN DETEMIR) 170am 40 units qhs VITAMIN B-12 1000 MCG TABS (CYANOCOBALAMIN) take 1 tab daily DOXYCYCLINE HYCLATE 100 MG TABS (DOXYCYCLINE HYCLATE) take 1 tab two times a day CITALOPRAM HYDROBROMIDE 40 MG TABS (CITALOPRAM HYDROBROMIDE) take 1 tab daily      Allergies Added:   Visit Type:  Follow-up Primary Provider:  Dr.Scott Luking   History of Present Illness: Return visit for this very pleasant 65 year old gentleman with coronary disease and a history of sudden death requiring implantation of an automatic implantable cardiac defibrillator.  He is now seen in followup following an admission in November with chest pain, dyspnea and near syncope.  He also was admitted to [facility] in 2009 for an episode of syncope.  His most recent spell was thought related to orthostatic hypotension; however, he now brings in a list of blood pressures with mild to moderate systolic hypertension.  Systolics are ranging between 135 and 190.  He has 3 blood pressure cuffs at home that agree with one another.  He also checked these devices with one in Dr. Fletcher Anon office.   Symptomatically, he has been doing fairly well.  He reports only occasional episodes of chest discomfort requiring nitroglycerin.  The most recent was more epigastric than located in his chest.  he is able to work and do most of his usual activities without any dyspnea.  He has no orthopnea nor PND.  He believes he was evaluated by a neurologist in [place].  We are seeking those records.  Echocardiogram  Procedure date:  05/26/2008  Findings:      LV-moderate  left ventricular hypertrophy; basilar inferior wall akinesis; normal overall systolic function; ejection fraction of 55%  Pacing wire in the right ventricle  Trivial paracardial effusion  No significant valvular abnormalities.   Current Medications (verified): 1)  Norvasc 5 Mg  Tabs (Amlodipine Besylate) .... Take 1 /2 Tab Daily 2)  Aspir-Low 81 Mg Tbec (Aspirin) .... Take 1 Tab Daily 3)  Fish Oil 1000 Mg  Caps (Omega-3 Fatty Acids) .... Take 1 By Mouth Qd 4)  Metformin Hcl 1000 Mg  Tabs (Metformin Hcl) .... Take 1 By Mouth Two Times A Day Qd 5)  Alprazolam 1 Mg  Tabs (Alprazolam) .... Take 1 At Bedtime Prn 6)  Hydrocodone-Acetaminophen 7.5-325 Mg  Tabs (Hydrocodone-Acetaminophen) .... Take As Needed 7)  Clomid 50 Mg  Tabs (Clomiphene Citrate) .... Take 1/2 Tab Daily 8)  Omeprazole 20 Mg Cpdr (Omeprazole) .... Take 1 Tab Daily 9)  Doxazosin Mesylate 4 Mg Tabs (Doxazosin Mesylate) .... Take 1 By Mouth Qd 10)  Lipitor 80 Mg Tabs (Atorvastatin Calcium) .... Take 1 By Mouth Qd 11)  Aggrenox 25-200 Mg Xr12h-Cap (Aspirin-Dipyridamole) .... Take 2 By Mouth Once Daily For 2 Weeks Then Start Back in Plavix & Asprin 12)  Fexofenadine Hcl 180 Mg Tabs (Fexofenadine Hcl) .Marland Kitchen.. 1 Daily 13)  Fluticasone Propionate 50 Mcg/act Susp (Fluticasone Propionate) .Marland Kitchen.. 1 Daily 14)  Tamsulosin Hcl 0.4 Mg Caps (Tamsulosin Hcl) .Marland Kitchen.. 1 Tab Qd 15)  Diabetic Shoes .Marland KitchenMarland Kitchen. 250.01 16)  Levemir 100 Unit/ml Soln (Insulin Detemir) .Marland KitchenMarland Kitchen. 170am 40 Units Qhs 17)  Vitamin B-12 1000 Mcg  Tabs (Cyanocobalamin) .... Take 1 Tab Daily 18)  Doxycycline Hyclate 100 Mg Tabs (Doxycycline Hyclate) .... Take 1 Tab Two Times A Day 19)  Citalopram Hydrobromide 40 Mg Tabs (Citalopram Hydrobromide) .... Take 1 Tab Daily  Allergies (verified): 1)  ! Penicillin  Past History:  PMH, FH, and Social History reviewed and updated.  Family History:  Positive for MI.   Social History:  Lives in [place] with his family.  He runs an    Arts administrator.  No smoking, alcohol, or illicit drug use.   Review of Systems       The patient complains of chest pain, syncope, dyspnea on exertion, and headaches.  The patient denies weight loss, weight gain, hoarseness, peripheral edema, and prolonged cough.    Vital Signs:  Patient profile:   65 year old male Weight:      242 pounds Pulse rate:   106 / minute BP supine:   150 / 85 BP sitting:   110 / 69  (right arm) BP standing:   120 / 75  Vitals Entered By: Dreama Saa, CNA (April 30, 2009 2:28 PM)   Physical Exam  General:   General-Well developed; no acute distress; overweight but muscular   Neck-No JVD; no carotid bruits: Lungs-No tachypnea, no rales; no rhonchi; no wheezes: Cardiovascular-normal PMI;split S1 and S2: Abdomen-BS normal; soft and non-tender without masses or organomegaly:  Musculoskeletal-No deformities, no cyanosis or clubbing: Neurologic-Normal cranial nerves; symmetric strength and tone:  Skin-Warm, no significant lesions: Extremities-Nl distal pulses; trace edema:      ICD Specifications ICD Vendor:  [name]     ICD Model Number:  V234     ICD Serial Number:  [id number] ICD DOI:  11/06/2003      Lead 1:    Location: RA     DOI: 11/06/2003     Model #: 1488TC     Serial #: [id number]     Status: active Lead 2:    Location: RV     DOI: 11/06/2003     Model #: 1580     Serial #: [id number]     Status: active  Indications::  Cardiac Arrest  Explantation Comments: Merlin  Impression & Recommendations:  Problem # 1:  ATHEROSCLEROTIC CARDIOVASCULAR DISEASE (ICD-429.2) ASCVD is stable with intermittent chest discomfort that does not clearly represent myocardial ischemia.  Performance status is good.  Current medications will be continued.  Problem # 2:  SYNCOPE (ICD-780.2) This appears related to orthostatic hypotension.  There is a significant change in blood pressure when standing today although standing blood pressure is quite adequate and  not associated with symptoms.  We may not be able to tightly control his lying blood pressure.  Moreover, blood pressure in the office today is lower than just about any measurement that he took at home.  We will provide him with a 24-hour blood pressure device to better define the extent of his hypertension.  Problem # 3:  HYPERLIPIDEMIA (ICD-272.4)  Lipid profile was good when assessed in April of 2009; a repeat study will be obtained.  I'll plan to see this nice to him again in 4 months.  Problem # 4:  SUDDEN CARDIAC ARREST-AICD 2005 (ICD-V12.53) He is due for device followup early next year.  Other Orders: T-Lipid Profile [id number]) Future Orders: T-Lipid Profile ([id number]) ... 05/05/2009  Patient Instructions: 1)  Your physician recommends that you schedule a follow-up appointment in: 4months 2)  24 hours bp recording I will call you  within the next few days to schedule an appt date and time 3)  Your physician recommends that you return for a FASTING lipid profile: next week

## 2010-06-02 NOTE — Progress Notes (Signed)
Summary: rx  Phone Note Call from Patient Call back at Home Phone [phone]   Caller: [phone] Call For: Minus Breeding MD Summary of Call: per Sharyon Cable wife need a refill on Clomid 50 Mg Tabs (Clomiphene citrate) .... 1/4 tab three times weekly (m,w,f) [facility]* (retail) [address] [place], [place]  [address] Ph: [phone] Fax: [phone] Initial call taken by: Shelbie Proctor,  April 19, 2008 11:13 AM  Follow-up for Phone Call        Sent renewal into pharm Follow-up by: Orlan Leavens,  April 19, 2008 11:17 AM  Additional Follow-up for Phone Call Additional follow up Details #1::        called pt to inform meds were sent  Additional Follow-up by: Shelbie Proctor,  April 19, 2008 11:25 AM      Prescriptions: CLOMID 50 MG  TABS (CLOMIPHENE CITRATE) 1/4 tab three times weekly (m,w,f)  #4 x 1   Entered by:   Orlan Leavens   Authorized by:   Minus Breeding MD   Signed by:   Orlan Leavens on 04/19/2008   Method used:   Electronically to        [facility]* (retail)       [address]       [place], [place]  [address]       Ph: [phone]       Fax: [phone]   RxID:   [id number]

## 2010-06-02 NOTE — Assessment & Plan Note (Signed)
Summary: 3 mth fu-$50-stc   Vital Signs:  Patient Profile:   65 Years Old Male Weight:      228.4 pounds Temp:     97.6 degrees F oral Pulse rate:   74 / minute Pulse rhythm:   regular BP sitting:   144 / 87  (right arm) Cuff size:   regular  Vitals Entered By: Rock Nephew CMA (November 20, 2007 9:10 AM)                 PCP:  luking  Chief Complaint:  24mo follow up.  History of Present Illness: has lost 20 lbs due to his efforts.  had to slightly reduce his insulin to avoid hypoglycemia.  he brings record of his cbg's checked at varying times of day.  it varies 58-200's, with no pattern.  he readjusted it  to 20 units humalog three times a day (qac) and lantus 55 units at bedtime.    Current Allergies: ! PENICILLIN  Past Medical History:    Reviewed history from 03/06/2007 and no changes required:       Allergic rhinitis       Coronary artery disease       Depression       Diabetes mellitus, type II       GERD       Hypertension       Benign prostatic hypertrophy       sleep study was recently negative for sleep apnea, per the patient's report.  Past Surgical History:    Reviewed history and no changes required:       left tkr 2009     Review of Systems  The patient denies syncope.     Physical Exam  General:     well developed, well nourished, in no acute distress Skin:     insulin injection sites at anterior abdomen are normal  Additional Exam:     A1C%                 [H]  8.2 %       Impression & Recommendations:  Problem # 1:  DIABETES MELLITUS, TYPE II (ICD-250.00) needs simpler regimen  Medications Added to Medication List This Visit: 1)  Flomax 0.4 Mg Xr24h-cap (Tamsulosin hcl) 2)  Humalog Mix 75/25 Kwikpen 75-25 % Susp (Insulin lispro prot & lispro) .... 70 units qam and 30 qpm   Patient Instructions: 1)  change both current insulins to humalog 75/25 70 units qam and 30 qpm 2)  ret 2-3 weeks   Prescriptions: HUMALOG MIX 75/25  KWIKPEN 75-25 %  SUSP (INSULIN LISPRO PROT & LISPRO) 70 units qam and 30 qpm  #120 x 11   Entered and Authorized by:   Minus Breeding MD   Signed by:   Minus Breeding MD on 11/20/2007   Method used:   Electronically sent to ...       [facility]*       [id number]       [place], [place]  [address]       Ph: [phone]       Fax: [phone]   RxID:   [id number]  ]

## 2010-06-02 NOTE — Assessment & Plan Note (Signed)
Summary: F/U APPT/#/CD   Vital Signs:  Patient profile:   65 year old male Height:      69 inches (175.26 cm) Weight:      234.38 pounds (106.54 kg) O2 Sat:      95 % on Room air Temp:     98.2 degrees F (36.78 degrees C) oral Pulse rate:   88 / minute BP sitting:   140 / 80  (left arm) Cuff size:   large  Vitals Entered By: Josph Macho RMA (July 04, 2009 11:13 AM)  O2 Flow:  Room air CC: Follow-up visit/ CF Is Patient Diabetic? Yes   Primary Provider:  Dr.Scott Luking  CC:  Follow-up visit/ CF.  History of Present Illness: pt states he feels well in general.  he brings a record of his cbg's which i have reviewed today.  it varies from 70's (before lunch or afternoon) to 200 (other times).    Current Medications (verified): 1)  Norvasc 5 Mg  Tabs (Amlodipine Besylate) .... Take 1 /2 Tab Daily 2)  Aspir-Low 81 Mg Tbec (Aspirin) .... Take 1 Tab Daily 3)  Fish Oil 1000 Mg  Caps (Omega-3 Fatty Acids) .... Take 1 By Mouth Qd 4)  Metformin Hcl 1000 Mg  Tabs (Metformin Hcl) .... Take 1 By Mouth Two Times A Day Qd 5)  Alprazolam 1 Mg  Tabs (Alprazolam) .... Take 1 At Bedtime Prn 6)  Hydrocodone-Acetaminophen 7.5-325 Mg  Tabs (Hydrocodone-Acetaminophen) .... Take As Needed 7)  Clomid 50 Mg  Tabs (Clomiphene Citrate) .... Take 1/2 Tab Daily 8)  Omeprazole 20 Mg Cpdr (Omeprazole) .... Take 1 Tab Daily 9)  Doxazosin Mesylate 4 Mg Tabs (Doxazosin Mesylate) .... Take 1 By Mouth Qd 10)  Lipitor 80 Mg Tabs (Atorvastatin Calcium) .... Take 1 By Mouth Qd 11)  Aggrenox 25-200 Mg Xr12h-Cap (Aspirin-Dipyridamole) .... Take 2 By Mouth Once Daily For 2 Weeks Then Start Back in Plavix & Asprin 12)  Fexofenadine Hcl 180 Mg Tabs (Fexofenadine Hcl) .Marland Kitchen.. 1 Daily 13)  Fluticasone Propionate 50 Mcg/act Susp (Fluticasone Propionate) .Marland Kitchen.. 1 Daily 14)  Tamsulosin Hcl 0.4 Mg Caps (Tamsulosin Hcl) .Marland Kitchen.. 1 Tab Qd 15)  Diabetic Shoes .Marland KitchenMarland Kitchen. 250.01 16)  Levemir 100 Unit/ml Soln (Insulin Detemir) .Marland KitchenMarland Kitchen. 170am  40 Units Qhs 17)  Vitamin B-12 1000 Mcg Tabs (Cyanocobalamin) .... Take 1 Tab Daily 18)  Doxycycline Hyclate 100 Mg Tabs (Doxycycline Hyclate) .... Take 1 Tab Two Times A Day 19)  Citalopram Hydrobromide 40 Mg Tabs (Citalopram Hydrobromide) .... Take 1 Tab Daily 20)  Lisinopril 5 Mg Tabs (Lisinopril) .... Take One Tablet By Mouth Daily  Allergies (verified): 1)  ! Penicillin  Past History:  Past Medical History: Last updated: 03/26/2009 ASCVD: 70% mid left anterior descending lesion on cath in 06/1995; left anterior descending DES placed in 8/03 and RCA stent in 9/03; captain 3/05 revealed 90% second marginal for which PCI was performed, 70% PDA and total obstruction of the first diagonal and marginal; sudden cardiac death in [place] a casino in 10/2003 for which automatic implantable cardiac defibrillator placed; negative stress nuclear study in 01/2006 with ejection fraction of 52% ________________________________________ Hypertension Hyperlipidemia Tobacco abuse: 100-pack-year consumption; cigarettes discontinued 2003; all tobacco products in 2008 Cerebrovascular disease: Transient ischemic attack in 05/2008; carotid ultrasound-plaque without focal disease AODM-insulin requirement Degenerative joint disease-C-spine fusion in 2002 Erectile dysfunction Anxiety and depression Benign prostatic hypertrophy sleep study was recently negative for sleep apnea, per the patient's report. HYPOGONADISM, MALE (ICD-257.2) BENIGN PROSTATIC  HYPERTROPHY (ICD-600.00) GERD (ICD-530.81) DIABETES MELLITUS, TYPE II (ICD-250.00) DEPRESSION (ICD-311) CORONARY ARTERY DISEASE (ICD-414.00) ALLERGIC RHINITIS (ICD-477.9)  Review of Systems  The patient denies hypoglycemia.    Physical Exam  General:  obese.  no distress  Neck:  Supple without thyroid enlargement or tenderness.    Impression & Recommendations:  Problem # 1:  DIABETES MELLITUS, TYPE II (ICD-250.00) he needs adjustments of his  insulin  Medications Added to Medication List This Visit: 1)  Humulin 70/30 70-30 % Susp (Insulin isophane & regular) .Marland KitchenMarland KitchenMarland Kitchen 140 units am and 45 units pm  Other Orders: TLB-A1C / Hgb A1C (Glycohemoglobin) (83036-A1C) Est. Patient Level III ([address])  Patient Instructions: 1)  take reli-on 70/30, 140 units am and 45 units pm. 2)  Please schedule a follow-up appointment in 3 months. 3)  check your blood sugar 2 times a day.  vary the time of day when you check, between before the 3 meals, and at bedtime.  also check if you have symptoms of your blood sugar being too high or too low.  please keep a record of the readings and bring it to your next appointment here.  please call us sooner if you are having low blood sugar episodes.

## 2010-06-02 NOTE — Assessment & Plan Note (Signed)
Summary: PER PH NOTE/DAHLIA-INSULIN--STC   Vital Signs:  Patient profile:   65 year old male Height:      65 inches Weight:      236 pounds BMI:     39.41 O2 Sat:      96 % on Room air Temp:     98.1 degrees F oral Pulse rate:   110 / minute BP supine:   125 / 80  (right arm) BP sitting:   110 / 80  (right arm) Cuff size:   large  Vitals Entered By: Bill Salinas CMA (January 07, 2009 2:40 PM)  O2 Flow:  Room air CC: pt here for a f/u and states since his change in levemir has not helped his CBGs, pt has been having recent problems with passing out and thinks it may be due to recent episodes of low BP/ ab   Primary Provider:  luking  CC:  pt here for a f/u and states since his change in levemir has not helped his CBGs and pt has been having recent problems with passing out and thinks it may be due to recent episodes of low BP/ ab.  History of Present Illness: pt has cbg's persistently in the 200's despite increasing the levemir to 170 units qam.  he was in [facility] hosp few days ago due to syncope, but cbg was 217 by 911 personnel then.  he brings a record of his cbg's which i have reviewed today.  it ranges from 160-300.  it is in general higher later in the day.  he has lost 8 lbs since last ov.    Current Medications (verified): 1)  Norvasc 5 Mg  Tabs (Amlodipine Besylate) .... Take 1 Tab By Mouth Qd 2)  Aspirin 325 Mg  Tbec (Aspirin) .... Take 1 By Mouth Qd 3)  Celexa 40 Mg  Tabs (Citalopram Hydrobromide) .... Take 1 By Mouth Qd 4)  Fish Oil 1000 Mg  Caps (Omega-3 Fatty Acids) .... Take 1 By Mouth Qd 5)  Metformin Hcl 1000 Mg  Tabs (Metformin Hcl) .... Take 1 By Mouth Two Times A Day Qd 6)  Metoprolol Succinate 100 Mg  Tb24 (Metoprolol Succinate) .... Take 1 By Mouth Qd 7)  Plavix 75 Mg  Tabs (Clopidogrel Bisulfate) .... Take 1 By Mouth Qd 8)  Vitamin B-12 Cr 1000 Mcg  Tbcr (Cyanocobalamin) .... Take 1 By Mouth Qd 9)  Alprazolam 1 Mg  Tabs (Alprazolam) .... Take 1 At  Bedtime Prn 10)  Hydrocodone-Acetaminophen 7.5-325 Mg  Tabs (Hydrocodone-Acetaminophen) .... Take 1 By Mouth Q 4-6 Hours Prn 11)  Clomid 50 Mg  Tabs (Clomiphene Citrate) .... 1/4 Tab Qd 12)  Omeprazole 40 Mg Cpdr (Omeprazole) .... Take 1 By Mouth Qd 13)  Doxazosin Mesylate 4 Mg Tabs (Doxazosin Mesylate) .... Take 1 By Mouth Qd 14)  Lipitor 80 Mg Tabs (Atorvastatin Calcium) .... Take 1 By Mouth Qd 15)  Aggrenox 25-200 Mg Xr12h-Cap (Aspirin-Dipyridamole) .... Take 2 By Mouth Once Daily For 2 Weeks Then Start Back in Plavix & Asprin 16)  Alprazolam 0.25 Mg Tbdp (Alprazolam) .... Take 1 By Mouth Qd 17)  Unifine Pentips 31g X 8 Mm Misc (Insulin Pen Needle) .... Use To Administer Humalog Injection Three Times A Day 18)  Ventolin Hfa 108 (90 Base) Mcg/act Aers (Albuterol Sulfate) .... 2 Puffs Q4h As Needed 19)  Nasonex 50 Mcg/act Susp (Mometasone Furoate) .... 2 Sprays Each Nostril Qam 20)  Aggrenox 25-200 Mg Xr12h-Cap (Aspirin-Dipyridamole) .Marland Kitchen.. 1 Tab  Two Times A Day 21)  Fexofenadine Hcl 180 Mg Tabs (Fexofenadine Hcl) .Marland Kitchen.. 1 Daily 22)  Fluticasone Propionate 50 Mcg/act Susp (Fluticasone Propionate) .Marland Kitchen.. 1 Daily 23)  Cialis 20 Mg Tabs (Tadalafil) .... For As Needed Use 24)  Sulfamethoxazole-Trimethoprim 400-80 Mg/29ml Soln (Sulfamethoxazole-Trimethoprim) .Marland Kitchen.. 1 Tab Two Times A Day 25)  Levemir 100 Unit/ml Soln (Insulin Detemir) .Marland KitchenMarland KitchenMarland Kitchen 170 Units Qam  Allergies (verified): 1)  ! Penicillin  Past History:  Past Medical History: Last updated: 12/07/2007 Benign prostatic hypertrophy sleep study was recently negative for sleep apnea, per the patient's report. HYPOGONADISM, MALE (ICD-257.2) BENIGN PROSTATIC HYPERTROPHY (ICD-600.00) HYPERTENSION (ICD-401.9) GERD (ICD-530.81) DIABETES MELLITUS, TYPE II (ICD-250.00) DEPRESSION (ICD-311) CORONARY ARTERY DISEASE (ICD-414.00) ALLERGIC RHINITIS (ICD-477.9)  Review of Systems  The patient denies hypoglycemia.    Physical Exam  General:  obese.  no  distress  Msk:  gait is normal and steady   Impression & Recommendations:  Problem # 1:  DIABETES MELLITUS, TYPE II (ICD-250.00) needs increased rx  Problem # 2:  HYPOTENSION, ORTHOSTATIC (ICD-458.0) Assessment: Improved  Problem # 3:  SYNCOPE (ICD-780.2) prob due to #2  Medications Added to Medication List This Visit: 1)  Levemir 100 Unit/ml Soln (Insulin detemir) .Marland KitchenMarland KitchenMarland Kitchen 180 units qam  Other Orders: Est. Patient Level III ([address])  Patient Instructions: 1)  increase levemir to 180 units each am.  if all readings are still above 150, increase to 190 units each am. 2)  Please schedule a follow-up appointment in 2 weeks. 3)  see dr Gerda Diss tomorrow as scheduled Prescriptions: LEVEMIR 100 UNIT/ML SOLN (INSULIN DETEMIR) 180 units qam  #6 vials x 11   Entered and Authorized by:   Minus Breeding MD   Signed by:   Minus Breeding MD on 01/07/2009   Method used:   Electronically to        [facility]* (retail)       [address]       [place], [place]  [address]       Ph: [phone]       Fax: [phone]   RxID:   [id number]

## 2010-06-02 NOTE — Miscellaneous (Signed)
Summary: inpt labs  Clinical Lists Changes  Observations: Added new observation of TROPONIN I: 0.02 ng/mL (03/13/2009 10:47) Added new observation of CK-MB ISOENZ: 5.3 ng/mL (03/13/2009 10:47) Added new observation of CPK: 215 units/L (03/13/2009 10:47) Added new observation of GFR AA: >60 mL/min/1.83m2 (03/13/2009 10:47) Added new observation of GFR: >60 mL/min (03/13/2009 10:47) Added new observation of CREATININE: 0.80 mg/dL (19/14/7829 56:21) Added new observation of BUN: 8 mg/dL (30/86/5784 69:62) Added new observation of BG RANDOM: 196 mg/dL (95/28/4132 44:01) Added new observation of CO2 PLSM/SER: 27 meq/L (03/13/2009 10:47) Added new observation of CL SERUM: 108 meq/L (03/13/2009 10:47) Added new observation of K SERUM: 3.6 meq/L (03/13/2009 10:47) Added new observation of NA: 139 meq/L (03/13/2009 10:47) Added new observation of PLATELETK/UL: 118 K/uL (03/13/2009 10:47) Added new observation of MCV: 87.2 fL (03/13/2009 10:47) Added new observation of HCT: 35.3 % (03/13/2009 10:47) Added new observation of HGB: 12.3 g/dL (02/72/5366 44:03) Added new observation of WBC COUNT: 5.4 10*3/microliter (03/13/2009 10:47) Added new observation of HGBA1C: 10.1 % (03/13/2009 10:47)

## 2010-06-02 NOTE — Letter (Signed)
Summary: Appointment - Reminder 2  [facility]. [address]   [place], [place] [address]   Phone: [phone]  Fax: [phone]     April 10, 2010 MRN: [id number]   EARON RIVEST [address] [place], [place]  [address]   Dear Mr. SCHOPF,  Our records indicate that it is time to schedule a follow-up appointment.  Dr.   Dietrich Pates      recommended that you follow up with [place] in    10.2011 PAST DUE        . It is very important that we reach you to schedule this appointment. We look forward to participating in your health care needs. Please contact us at the number listed above at your earliest convenience to schedule your appointment.  If you are unable to make an appointment at this time, give [place] a call so we can update our records.     Sincerely,   Glass blower/designer

## 2010-06-02 NOTE — Cardiovascular Report (Signed)
Summary: [facility]  [facility]   Imported By: Dreama Saa, CNA 08/01/2009 16:06:42  _____________________________________________________________________  External Attachment:    Type:   Image     Comment:   External Document

## 2010-06-02 NOTE — Assessment & Plan Note (Signed)
Summary: 1 MTH FU-STC  Medications Added CLOMID 50 MG  TABS (CLOMIPHENE CITRATE) 1/4 tab three times weekly (m,w,f)        Vital Signs:  Patient Profile:   65 Years Old Male Weight:      234.38 pounds Temp:     98.2 degrees F oral Pulse rate:   104 / minute BP sitting:   150 / 87  (right arm) Cuff size:   large  Vitals Entered By: Orlan Leavens (March 06, 2007 1:06 PM)                 PCP:  luking   History of Present Illness: guarding his hypogonadism, there's no change in his ED symptoms.  CT scan of the pituitary was negative. regarding his diabetes, he had one episode where he got very sweaty when he was showing [facility]  He did not have his glucose testing device available to him, but this happened about one hour after supper.  personnel there gave him something to eat and drink, after which a glucose checked by personnel there was 96.  His lowest glucoses were during a recent viral illness.  In general, he still has many glucoses above 200 and they appear to be highest in the morning, despite eating no snack at bedtime..   Review of Systems       chief complaint is weight gain despite his efforts.  Current Allergies: ! PENICILLIN  Past Medical History:    Reviewed history from 01/12/2007 and no changes required:       Allergic rhinitis       Coronary artery disease       Depression       Diabetes mellitus, type II       GERD       Hypertension       Benign prostatic hypertrophy       sleep study was recently negative for sleep apnea, per the patient's report.     Review of Systems       chief complaint is weight gain despite his efforts.   Physical Exam  General:     obese.   Psych:     alert and cooperative; normal mood and affect; normal attention span and concentration    Impression & Recommendations:  Problem # 1:  HYPOGONADISM, MALE, central idiopathic (ICD-257.2)  Problem # 2:  DIABETES  MELLITUS, TYPE II (ICD-250.00) he needs more insulin adjustments. His updated medication list for this problem includes:    Humalog 100 Unit/ml Soln (Insulin lispro (human)) ..... See office notes    Lantus 100 Unit/ml Soln (Insulin glargine) ..... See office notes    Metformin Hcl 1000 Mg Tabs (Metformin hcl) .Marland Kitchen... Take 1 by mouth two times a day qd Creatinine: 0.8 (02/03/2007)  Orders: Est. Patient Level III ([address])   Medications Added to Medication List This Visit: 1)  Clomid 50 Mg Tabs (Clomiphene citrate) .... 1/4 tab three times weekly (m,w,f)   Patient Instructions: 1)  Clomid, one fourth of a 50-mg pill, Monday Wednesday and Friday 2)  we discussed the pros and cons of normalizing his testosterone. 3)  increase Lantus to 50 units nightly 4)  change Q. a.c. Humalog to 20 breakfast, 5 with lunch, and 25 at supper 5)  return in about 6 weeks    Prescriptions: CLOMID 50 MG  TABS (CLOMIPHENE CITRATE) 1/4 tab three times weekly (m,w,f)  #4 x 11  Entered and Authorized by:   Minus Breeding MD   Signed by:   Minus Breeding MD on 03/06/2007   Method used:   Print then Give to Patient   RxID:   [id number]  ]

## 2010-06-02 NOTE — Assessment & Plan Note (Signed)
Summary: 3 WKS FU---$50---STC   Vital Signs:  Patient Profile:   65 Years Old Male Weight:      233.0 pounds Temp:     97.8 degrees F oral Pulse rate:   103 / minute BP sitting:   100 / 50  (right arm) Cuff size:   regular  Pt. in pain?   no  Vitals Entered By: Orlan Leavens (January 19, 2008 2:03 PM)                  PCP:  luking  Chief Complaint:  3 week follow-up.  History of Present Illness: pt says his cbg's ramnge from 85-200.  it is lowest before lunch anfd before supper.  it is highest at hs and in am.  he brings a record of his cbg's which i have reviewed today.     Current Allergies: ! PENICILLIN  Past Medical History:    Reviewed history from 12/07/2007 and no changes required:       Benign prostatic hypertrophy       sleep study was recently negative for sleep apnea, per the patient's report.       HYPOGONADISM, MALE (ICD-257.2)       BENIGN PROSTATIC HYPERTROPHY (ICD-600.00)       HYPERTENSION (ICD-401.9)       GERD (ICD-530.81)       DIABETES MELLITUS, TYPE II (ICD-250.00)       DEPRESSION (ICD-311)       CORONARY ARTERY DISEASE (ICD-414.00)       ALLERGIC RHINITIS (ICD-477.9)     Review of Systems  The patient denies hypoglycemia.     Physical Exam  General:     obese.   Psych:     alert and cooperative; normal mood and affect; normal attention span and concentration    Impression & Recommendations:  Problem # 1:  DIABETES MELLITUS, TYPE II (ICD-250.00) the simpler regimen is helping him better.  Medications Added to Medication List This Visit: 1)  Humalog Mix 50/50 Kwikpen 50-50 % Susp (Insulin lispro prot & lispro) .... 55 units qam    40 units qpm   Patient Instructions: 1)  increase humalog 50/50 to 55 units am and 40 units pm 2)  ret 6 weeks   Prescriptions: HUMALOG MIX 50/50 KWIKPEN 50-50 % SUSP (INSULIN LISPRO PROT & LISPRO) 55 units qam    40 units qpm  #3 x 11   Entered and Authorized by:   Minus Breeding MD  Signed by:   Minus Breeding MD on 01/19/2008   Method used:   Electronically to        [facility]* (retail)       [address]       [place], [place]  [address]       Ph: [phone]       Fax: [phone]   RxID:   [id number]  ]

## 2010-06-02 NOTE — Letter (Signed)
Summary: [facility] - Ambulatory Blood Pressure Report  [facility] - Ambulatory Blood Pressure Report   Imported By: Marylou Mccoy 05/15/2009 11:52:00  _____________________________________________________________________  External Attachment:    Type:   Image     Comment:   External Document  Appended Document: [facility] - Ambulatory Blood Pressure Report lisinopril 5 mg once daily  [place] Bing, M.D.  Appended Document: [facility] - Ambulatory Blood Pressure Report Medications Added LISINOPRIL 5 MG TABS (LISINOPRIL) Take one tablet by mouth daily          Phone Note Outgoing Call   Call placed by: Larita Fife Via LPN,  May 19, 2009 10:38 AM Summary of Call: No answer, left message. Needs to add Lisinopril 5mg  by mouth once daily .  Follow-up for Phone Call        I sent a detailed letter in the mail Follow-up by: Teressa Lower RN,  May 19, 2009 11:13 AM    New/Updated Medications: LISINOPRIL 5 MG TABS (LISINOPRIL) Take one tablet by mouth daily Prescriptions: LISINOPRIL 5 MG TABS (LISINOPRIL) Take one tablet by mouth daily  #30 x 2   Entered by:   Teressa Lower RN   Authorized by:   Kathlen Brunswick, MD, [facility]   Signed by:   Teressa Lower RN on 05/19/2009   Method used:   Electronically to        [facility]* (retail)       [address]       [place], [place]  [address]       Ph: [phone]       Fax: [phone]   RxID:   [id number]

## 2010-06-02 NOTE — Letter (Signed)
Summary: RAD REPORT [facility]  RAD REPORT [facility]   Imported By: Rexene Alberts 04/09/2010 08:38:59  _____________________________________________________________________  External Attachment:    Type:   Image     Comment:   External Document

## 2010-06-02 NOTE — Assessment & Plan Note (Signed)
   Vital Signs:  Patient Profile:   65 Years Old Male Weight:      232 pounds Temp:     98 degrees F Pulse rate:   77 / minute BP sitting:   149 / 88                 History of Present Illness: dm--brings extensive record of cbg's;  range is 100-220;  highest at hs, lowest in am;  feels well except for fatigue ed--was noted on labs to have low testosterone    Past Medical History:    Allergic rhinitis    Coronary artery disease    Depression    Diabetes mellitus, type II    GERD    Hypertension    Benign prostatic hypertrophy     Review of Systems       denies hypoglycemia   Physical Exam  General:     alert, cooperative to examination, and overweight-appearing.   Skin:     insulin inject sites (abd, triceps) normal Additional Exam:     testosterone=264    Impression & Recommendations:  Problem # 1:  DIABETES MELLITUS, TYPE II (ICD-250.00) pattern of cbg's indicates need for more daytime insulin  Problem # 2:  hypogonadism   Patient Instructions: 1)  increase qac humalog to 20-10-30 2)  decrease lantus to 35 at bedtime 3)  check tsh/fsh/lh/prolactin 4)  Please schedule a follow-up appointment in 1 month.    ]

## 2010-06-02 NOTE — Assessment & Plan Note (Signed)
Summary: 1 MTH FU $50 STC   Vital Signs:  Patient profile:   65 year old male Height:      65 inches Weight:      247 pounds BMI:     41.25 Temp:     97.0 degrees F oral Pulse rate:   87 / minute BP sitting:   126 / 70  (left arm) Cuff size:   large  Vitals Entered By: Bill Salinas CMA (October 10, 2008 8:13 AM) CC: pt here for 1 month fu and co blood sugars still going up and down/ ab   Primary Provider:  luking  CC:  pt here for 1 month fu and co blood sugars still going up and down/ ab.  History of Present Illness: pt has had hypoglycemia at day or night.  cbg's vary from 50-313. he feels well in general, except for "allergy symptoms."  he brings a record of his cbg's which i have reviewed today.  Current Medications (verified): 1)  Norvasc 5 Mg  Tabs (Amlodipine Besylate) .... Take 1 Tab By Mouth Qd 2)  Aspirin 325 Mg  Tbec (Aspirin) .... Take 1 By Mouth Qd 3)  Celexa 40 Mg  Tabs (Citalopram Hydrobromide) .... Take 1 By Mouth Qd 4)  Fish Oil 1000 Mg  Caps (Omega-3 Fatty Acids) .... Take 1 By Mouth Qd 5)  Metformin Hcl 1000 Mg  Tabs (Metformin Hcl) .... Take 1 By Mouth Two Times A Day Qd 6)  Metoprolol Succinate 100 Mg  Tb24 (Metoprolol Succinate) .... Take 1 By Mouth Qd 7)  Plavix 75 Mg  Tabs (Clopidogrel Bisulfate) .... Take 1 By Mouth Qd 8)  Vitamin B-12 Cr 1000 Mcg  Tbcr (Cyanocobalamin) .... Take 1 By Mouth Qd 9)  Alprazolam 1 Mg  Tabs (Alprazolam) .... Take 1 At Bedtime Prn 10)  Hydrocodone-Acetaminophen 7.5-325 Mg  Tabs (Hydrocodone-Acetaminophen) .... Take 1 By Mouth Q 4-6 Hours Prn 11)  Clomid 50 Mg  Tabs (Clomiphene Citrate) .... 1/4 Tab Three Times Weekly (M,w,f) 12)  Humalog Mix 50/50 Kwikpen 50-50 % Susp (Insulin Lispro Prot & Lispro) .... 80 Units Am and 50 Units Pm 13)  Omeprazole 40 Mg Cpdr (Omeprazole) .... Take 1 By Mouth Qd 14)  Doxazosin Mesylate 4 Mg Tabs (Doxazosin Mesylate) .... Take 1 By Mouth Qd 15)  Lipitor 80 Mg Tabs (Atorvastatin Calcium) .... Take 1  By Mouth Qd 16)  Aggrenox 25-200 Mg Xr12h-Cap (Aspirin-Dipyridamole) .... Take 2 By Mouth Once Daily For 2 Weeks Then Start Back in Plavix & Asprin 17)  Alprazolam 0.25 Mg Tbdp (Alprazolam) .... Take 1 By Mouth Qd 18)  Unifine Pentips 31g X 8 Mm Misc (Insulin Pen Needle) .... Use To Administer Humalog Injection Three Times A Day 19)  Ventolin Hfa 108 (90 Base) Mcg/act Aers (Albuterol Sulfate) .... 2 Puffs Q4h As Needed 20)  Nasonex 50 Mcg/act Susp (Mometasone Furoate) .... 2 Sprays Each Nostril Qam 21)  Aggrenox 25-200 Mg Xr12h-Cap (Aspirin-Dipyridamole) .Marland Kitchen.. 1 Tab Two Times A Day 22)  Fexofenadine Hcl 180 Mg Tabs (Fexofenadine Hcl) .Marland Kitchen.. 1 Daily 23)  Fluticasone Propionate 50 Mcg/act Susp (Fluticasone Propionate) .Marland Kitchen.. 1 Daily 24)  Cialis 20 Mg Tabs (Tadalafil) .... For As Needed Use 25)  Sulfamethoxazole-Trimethoprim 400-80 Mg/6ml Soln (Sulfamethoxazole-Trimethoprim) .Marland Kitchen.. 1 Tab Two Times A Day  Allergies (verified): 1)  ! Penicillin  Past History:  Past Medical History: Last updated: 12/07/2007 Benign prostatic hypertrophy sleep study was recently negative for sleep apnea, per the patient's report. HYPOGONADISM, MALE (ICD-257.2)  BENIGN PROSTATIC HYPERTROPHY (ICD-600.00) HYPERTENSION (ICD-401.9) GERD (ICD-530.81) DIABETES MELLITUS, TYPE II (ICD-250.00) DEPRESSION (ICD-311) CORONARY ARTERY DISEASE (ICD-414.00) ALLERGIC RHINITIS (ICD-477.9)  Review of Systems  The patient denies syncope.    Physical Exam  General:  obese.  no distress  Skin:  insulin injection sites at anterior abdomen are normal    Impression & Recommendations:  Problem # 1:  DIABETES MELLITUS, TYPE II (ICD-250.00) apparently overcontrolled  Medications Added to Medication List This Visit: 1)  Humalog Mix 50/50 Kwikpen 50-50 % Susp (Insulin lispro prot & lispro) .... 75 units am and 45 units pm 2)  Sulfamethoxazole-trimethoprim 400-80 Mg/61ml Soln (Sulfamethoxazole-trimethoprim) .Marland Kitchen.. 1 tab two times a  day  Other Orders: Est. Patient Level III ([address])  Patient Instructions: 1)  reduce humalog to 75 units am and 45 units pm. 2)  Please schedule a follow-up appointment in 2 months. 3)  it is very important to keep good control of blood pressure and cholesterol, especially in those with diabetes.  please discuss these with your doctor.  you should take an aspirin every day, unless you have been advised by a doctor not to.  you should see an eye doctor every year.

## 2010-06-02 NOTE — Assessment & Plan Note (Signed)
Summary: 6 WK FU-#--STC   Vital Signs:  Patient profile:   65 year old male Height:      65 inches (165.10 cm) Weight:      241.38 pounds (109.72 kg) O2 Sat:      97 % on Room air Temp:     96.9 degrees F (36.06 degrees C) oral Pulse rate:   86 / minute BP sitting:   170 / 84  (left arm) Cuff size:   large  Vitals Entered By: Josph Macho CMA (April 02, 2009 9:13 AM)  O2 Flow:  Room air CC: follow-up visit/ CF Is Patient Diabetic? Yes   Primary Provider:  luking  CC:  follow-up visit/ CF.  History of Present Illness: pt states he feels well in general.  he brings a record of his cbg's which i have reviewed today.  he has occasional mild hypoglycemia if his evening meal is delayed. wheezing is resolved. he takes clomiphine as rx'ed.  Current Medications (verified): 1)  Norvasc 5 Mg  Tabs (Amlodipine Besylate) .... Take 1 Tab By Mouth Qd 2)  Aspirin 325 Mg  Tbec (Aspirin) .... Take 1 By Mouth Qd 3)  Celexa 40 Mg  Tabs (Citalopram Hydrobromide) .... Take 1 By Mouth Qd 4)  Fish Oil 1000 Mg  Caps (Omega-3 Fatty Acids) .... Take 1 By Mouth Qd 5)  Metformin Hcl 1000 Mg  Tabs (Metformin Hcl) .... Take 1 By Mouth Two Times A Day Qd 6)  Plavix 75 Mg  Tabs (Clopidogrel Bisulfate) .... Take 1 By Mouth Qd 7)  Alprazolam 1 Mg  Tabs (Alprazolam) .... Take 1 At Bedtime Prn 8)  Hydrocodone-Acetaminophen 7.5-325 Mg  Tabs (Hydrocodone-Acetaminophen) .... Take 1 By Mouth Q 4-6 Hours Prn 9)  Clomid 50 Mg  Tabs (Clomiphene Citrate) .... 1/4 Tab Qd 10)  Omeprazole 40 Mg Cpdr (Omeprazole) .... Take 1 By Mouth Qd 11)  Doxazosin Mesylate 4 Mg Tabs (Doxazosin Mesylate) .... Take 1 By Mouth Qd 12)  Lipitor 80 Mg Tabs (Atorvastatin Calcium) .... Take 1 By Mouth Qd 13)  Aggrenox 25-200 Mg Xr12h-Cap (Aspirin-Dipyridamole) .... Take 2 By Mouth Once Daily For 2 Weeks Then Start Back in Plavix & Asprin 14)  Fexofenadine Hcl 180 Mg Tabs (Fexofenadine Hcl) .Marland Kitchen.. 1 Daily 15)  Fluticasone Propionate 50  Mcg/act Susp (Fluticasone Propionate) .Marland Kitchen.. 1 Daily 16)  Cialis 20 Mg Tabs (Tadalafil) .... For As Needed Use 17)  Tamsulosin Hcl 0.4 Mg Caps (Tamsulosin Hcl) .Marland Kitchen.. 1 Tab Qd 18)  Protonix 40 Mg Tbec (Pantoprazole Sodium) .Marland Kitchen.. 1 Tab Qhs 19)  Relion 70/30 70-30 % Susp (Insulin Isophane & Regular) .Marland Kitchen.. 160 Units Am and 40 Units Pm 20)  Diabetic Shoes .Marland KitchenMarland Kitchen. 250.01 21)  Symbicort 80-4.5 Mcg/act Aero (Budesonide-Formoterol Fumarate) .Marland Kitchen.. 1 Puff Bid  Allergies (verified): 1)  ! Penicillin  Past History:  Past Medical History: Last updated: 03/26/2009 ASCVD: 70% mid left anterior descending lesion on cath in 06/1995; left anterior descending DES placed in 8/03 and RCA stent in 9/03; captain 3/05 revealed 90% second marginal for which PCI was performed, 70% PDA and total obstruction of the first diagonal and marginal; sudden cardiac death in [place] a casino in 10/2003 for which automatic implantable cardiac defibrillator placed; negative stress nuclear study in 01/2006 with ejection fraction of 52% ________________________________________ Hypertension Hyperlipidemia Tobacco abuse: 100-pack-year consumption; cigarettes discontinued 2003; all tobacco products in 2008 Cerebrovascular disease: Transient ischemic attack in 05/2008; carotid ultrasound-plaque without focal disease AODM-insulin requirement Degenerative joint disease-C-spine fusion  in 2002 Erectile dysfunction Anxiety and depression Benign prostatic hypertrophy sleep study was recently negative for sleep apnea, per the patient's report. HYPOGONADISM, MALE (ICD-257.2) BENIGN PROSTATIC HYPERTROPHY (ICD-600.00) GERD (ICD-530.81) DIABETES MELLITUS, TYPE II (ICD-250.00) DEPRESSION (ICD-311) CORONARY ARTERY DISEASE (ICD-414.00) ALLERGIC RHINITIS (ICD-477.9)  Review of Systems  The patient denies syncope.         he has slightly decreased urinary stream  Physical Exam  General:  obese.  no distress. Psych:  Alert and  cooperative; normal mood and affect; normal attention span and concentration.   Additional Exam:  test results are reviewed: fructosamine=374 (converts to a1c of 8.0) Testosterone              386.40 ng/dL    Impression & Recommendations:  Problem # 1:  DIABETES MELLITUS, TYPE II (ICD-250.00) needs increased rx  Problem # 2:  HYPOGONADISM, MALE (ICD-257.2) well-controlled  Problem # 3:  ASTHMA, MILD (ICD-493.90) Assessment: Improved  Medications Added to Medication List This Visit: 1)  Relion 70/30 70-30 % Susp (Insulin isophane & regular) .Marland Kitchen.. 160 units am and 45 units pm  Other Orders: T-Fructosamine (11914-78295) TLB-Testosterone, Total (84403-TESTO) Est. Patient Level IV (62130)  Patient Instructions: 1)  continue reli-on 70/30, to 160 units am and 40 units pm. 2)  Please schedule a follow-up appointment in 3 months. 3)  on this type of insulin schedule, meals should not be missed or delayed.   4)  cc dr Vonita Moss 5)  pt declines increasing insulin. 6)  (update: i left message on phone-tree:  i advised increasing relion 70/30 to 160 units am and 45 units pm). 7)  no more rx needed for asthma.

## 2010-06-02 NOTE — Letter (Signed)
Summary: Remote Device Check  [facility], Main Office  [address]   [place], [place] [address]   Phone: [phone]  Fax: [phone]     March 06, 2010 MRN: [id number]   SAKET HELLSTROM [address] [place], [place]  [address]   Dear Mr. EVITTS,   Your remote transmission was recieved and reviewed by your physician.  All diagnostics were within normal limits for you.  _X____Your next transmission is scheduled for: 05/28/2010.   Please transmit at any time this day.  If you have a wireless device your transmission will be sent automatically.  ______Your next office visit is scheduled for:                              . Please call our office to schedule an appointment.    Sincerely,  Altha Harm, LPN

## 2010-06-02 NOTE — Letter (Signed)
Summary: [facility], [facility] at [employer]  [address], [place] [address]   Phone: [phone]  Fax: [phone]     April 30, 2009 MRN: [id number]   ALANTE TOLAN [address] [place], [place]  [address]      YOUR LAB WORK IS DUE   ____________JANUARY 3, 2011_____________________________  Please go to [facility], located across the street from [facility] on the second floor.  Hours are Monday - Friday 7am until 7:30pm         Saturday 8am until 12noon    _X_  DO NOT EAT OR DRINK AFTER MIDNIGHT EVENING PRIOR TO LABWORK  __ YOUR LABWORK IS NOT FASTING --YOU MAY EAT PRIOR TO LABWORK

## 2010-06-02 NOTE — Cardiovascular Report (Signed)
Summary: Office Visit Remote   Office Visit Remote   Imported By: Roderic Ovens 05/21/2009 11:57:50  _____________________________________________________________________  External Attachment:    Type:   Image     Comment:   External Document

## 2010-06-02 NOTE — Letter (Signed)
Summary: Device-Delinquent Phone Journalist, newspaper, Main Office  [address]   [place], [place] [address]   Phone: [phone]  Fax: [phone]     November 05, 2009 MRN: [id number]   MAXXIMUS GOTAY [address] [place], [place]  [address]   Dear Mr. PETROSKY,  According to our records, you were scheduled for a device phone transmission on 10-27-2009.     We did not receive any results from this check.  If you transmitted on your scheduled day, please call us to help troubleshoot your system.  If you forgot to send your transmission, please send one upon receipt of this letter.  Thank you,   Architectural technologist [facility]

## 2010-06-02 NOTE — Progress Notes (Signed)
Summary: LAB RESULTS  Phone Note Call from Patient Call back at Home Phone [phone]   Caller: [phone] Call For: DR Lorane Gell Reason for Call: Lab or Test Results Summary of Call: PER WIFE CALL NEED LAB RESULTS FROM 11-20-07 Initial call taken by: Shelbie Proctor,  November 21, 2007 2:44 PM  Follow-up for Phone Call        Md has left msg on phone-tree. Follow-up by: Orlan Leavens,  November 21, 2007 4:14 PM  Additional Follow-up for Phone Call Additional follow up Details #1::        November 21, 2007 4:22 PM  called pt  to inform spoke with wife informed that he has already checked the phone tree retrived his results  Additional Follow-up by: Shelbie Proctor,  November 21, 2007 4:23 PM    2

## 2010-06-02 NOTE — Progress Notes (Signed)
  Phone Note Call from Patient Call back at Home Phone [phone]   Caller: Patient Call For: Lance Breeding MD Summary of Call: per Sharyon Cable call states he need two box a month , states  lanes give him  only one box , pt need another refill for the HUMALOG MIX 50/50 KWIKPEN 50-50 % SUSP (INSULIN LISPRO PROT & LISPRO) 55 units two times a day sent to [facility]* (retail) [address] [place], [place]  [address] Ph: [phone] Fax: [phone] Initial call taken by: Shelbie Proctor,  July 29, 2008 4:13 PM  Follow-up for Phone Call        Md rx 2 boxes on 06/13/08 will resend prescrition Follow-up by: Orlan Leavens,  July 29, 2008 4:49 PM  Additional Follow-up for Phone Call Additional follow up Details #1::        called pt to inform rx was sent left msg on vm  that rx was sent Additional Follow-up by: Shelbie Proctor,  July 30, 2008 9:14 AM      Prescriptions: HUMALOG MIX 50/50 KWIKPEN 50-50 % SUSP (INSULIN LISPRO PROT & LISPRO) 55 units am and 65 units pm  #2 box x 10   Entered by:   Orlan Leavens   Authorized by:   Lance Breeding MD   Signed by:   Orlan Leavens on 07/29/2008   Method used:   Electronically to        [facility]* (retail)       [address]       [place], [place]  [address]       Ph: [phone]       Fax: [phone]   RxID:   [id number]

## 2010-06-02 NOTE — Cardiovascular Report (Signed)
Summary: Office Visit Remote   Office Visit Remote   Imported By: Roderic Ovens 03/10/2010 15:17:46  _____________________________________________________________________  External Attachment:    Type:   Image     Comment:   External Document

## 2010-06-02 NOTE — Assessment & Plan Note (Signed)
Vital Signs:  Patient Profile:   65 Years Old Male Weight:      238 pounds Temp:     9.3 degrees F Pulse rate:   81 / minute BP sitting:   173 / 99                 Visit Type:  f/u PCP:  luking  Chief Complaint:  dm.  History of Present Illness: regarding his diabetes, patient states that his glucoses are about the same.  He brings with him and extensive record.  There is no particular trend throughout the day in his record, except that his glucoses appear to be slightly higher at bedtime and in the morning.  They vary from the low 100s up to an occasional 300, but most are in the 100s.  Regarding his hypogonadism, he states the LEVITRA samples I gave him helped his ed sxs  Current Allergies: ! PENICILLIN  Past Medical History:    Reviewed history from 01/12/2007 and no changes required:       Allergic rhinitis       Coronary artery disease       Depression       Diabetes mellitus, type II       GERD       Hypertension       Benign prostatic hypertrophy     Review of Systems       one episode of a glucose of 36 at bedtime.  He feels he may have taken an extra injection of insulin at supper time on this occasion, but he is not certain.   Physical Exam  General:     overweight-appearing.   Genitalia:     circumcised and no testicular masses or atrophy.      Impression & Recommendations:  Problem # 1:  DIABETES MELLITUS, TYPE II (ICD-250.00)  His updated medication list for this problem includes:    Humalog 100 Unit/ml Soln (Insulin lispro (human)) ..... See office notes    Lantus 100 Unit/ml Soln (Insulin glargine) ..... See office notes    Aspirin 325 Mg Tbec (Aspirin) .Marland Kitchen... Take 1 by mouth qd    Lisinopril 5 Mg Tabs (Lisinopril) .Marland Kitchen... Take 1/2  by mouth once daily    Metformin Hcl 1000 Mg Tabs (Metformin hcl) .Marland Kitchen... Take 1 by mouth two times a day qd   Problem # 2:  hypogonadism, secondary  Complete Medication List: 1)  Humalog 100 Unit/ml Soln  (Insulin lispro (human)) .... See office notes 2)  Lantus 100 Unit/ml Soln (Insulin glargine) .... See office notes 3)  Norvasc 5 Mg Tabs (Amlodipine besylate) .... Take 1/2 tab by mouth qd 4)  Aspirin 325 Mg Tbec (Aspirin) .... Take 1 by mouth qd 5)  Celexa 40 Mg Tabs (Citalopram hydrobromide) .... Take 1 by mouth qd 6)  Fish Oil 1000 Mg Caps (Omega-3 fatty acids) .... Take 1 by mouth qd 7)  Fexofenadine Hcl 180 Mg Tabs (Fexofenadine hcl) .... Take 1 by mouth qd 8)  Lisinopril 5 Mg Tabs (Lisinopril) .... Take 1/2  by mouth once daily 9)  Metformin Hcl 1000 Mg Tabs (Metformin hcl) .... Take 1 by mouth two times a day qd 10)  Metoprolol Succinate 100 Mg Tb24 (Metoprolol succinate) .... Take 1 by mouth qd 11)  Plavix 75 Mg Tabs (Clopidogrel bisulfate) .... Take 1 by mouth qd 12)  Prevacid 30 Mg Cpdr (Lansoprazole) .... Take 1 by mouth qd 13)  Vitamin B-12 Cr 1000  Mcg Tbcr (Cyanocobalamin) .... Take 1 by mouth qd 14)  Alprazolam 1 Mg Tabs (Alprazolam) .... Take 1 by mouth qd 15)  Hydrocodone-acetaminophen 7.5-325 Mg Tabs (Hydrocodone-acetaminophen) .... Take 1 by mouth q 4-6 hours prn   Patient Instructions: 1)  increase Lantus to 40 units every night 2)  continue Q. a.c. Humalog 20 -- 10 -- 30 3)  I gave him a prescription for Levitra and a coupon 4)  check MRI of the pituitary with contrast 5)  return in about 30 days    ]

## 2010-06-02 NOTE — Letter (Signed)
Summary: Remote Device Check  [facility], Main Office  [address]   [place], [place] [address]   Phone: [phone]  Fax: [phone]     November 11, 2009 MRN: [id number]   RAJEEV ESCUE [address] [place], [place]  [address]   Dear Mr. MASTRANGELO,   Your remote transmission was recieved and reviewed by your physician.  All diagnostics were within normal limits for you.  __X___Your next transmission is scheduled for:  02-05-2010.  Please transmit at any time this day.  If you have a wireless device your transmission will be sent automatically.   Sincerely,  Vella Kohler

## 2010-06-02 NOTE — Progress Notes (Signed)
  Phone Note Call from Patient Call back at Home Phone [phone]   Caller: [phone] Call For: Minus Breeding MD Summary of Call: per pt taking 110 a day need additional refills on HUMALOG MIX 50/50 KWIKPEN 50-50 % SUSP (INSULIN LISPRO PROT & LISPRO) 55 units qam    40 units qpm need 2 booxes  [facility]* (retail) [address] [place], [place]  [address] Ph: [phone] Fax: [phone] Initial call taken by: Shelbie Proctor,  March 04, 2008 2:55 PM  Follow-up for Phone Call        sent (i made it 3 boxes, as he takes 55 units bid) Follow-up by: Minus Breeding MD,  March 04, 2008 4:40 PM  Additional Follow-up for Phone Call Additional follow up Details #1::        CALLED PT TO INFORM PT WAS NOTIFY Additional Follow-up by: Shelbie Proctor,  March 05, 2008 9:00 AM      Prescriptions: HUMALOG MIX 50/50 KWIKPEN 50-50 % SUSP (INSULIN LISPRO PROT & LISPRO) 55 units bid  #3 x 11   Entered and Authorized by:   Minus Breeding MD   Signed by:   Minus Breeding MD on 03/04/2008   Method used:   Electronically to        [facility]* (retail)       [address]       [place], [place]  [address]       Ph: [phone]       Fax: [phone]   RxID:   [id number]

## 2010-06-02 NOTE — Letter (Signed)
Summary: Device-Delinquent Phone Journalist, newspaper, Main Office  [address]   [place], [place] [address]   Phone: [phone]  Fax: [phone]     April 28, 2009 MRN: [id number]   JHAMARI MARKOWICZ [address] [place], [place]  [address]   Dear Mr. FLANAGAN,  According to our records, you were scheduled for a device phone transmission on  February 17, 2009.     We did not receive any results from this check.  If you transmitted on your scheduled day, please call us to help troubleshoot your system.  If you forgot to send your transmission, please send one upon receipt of this letter.  Thank you,   Architectural technologist [facility]  2ND LETTER

## 2010-06-02 NOTE — Letter (Signed)
Summary: [facility]  [facility]   Imported By: Dreama Saa, CNA 05/06/2009 10:37:24  _____________________________________________________________________  External Attachment:    Type:   Image     Comment:   External Document

## 2010-06-02 NOTE — Progress Notes (Signed)
SummaryEbbie Latus PENTIPS  Phone Note Refill Request Message from:  Fax from Pharmacy on August 06, 2008 2:47 PM  Refills Requested: Medication #1:  UNIFINE PENTIPS NEEDLES #100 [facility]  Initial call taken by: Orlan Leavens,  August 06, 2008 2:48 PM    New/Updated Medications: UNIFINE PENTIPS 31G X 8 MM MISC (INSULIN PEN NEEDLE) USE TO ADMINISTER HUMALOG INJECTION three times a day   Prescriptions: UNIFINE PENTIPS 31G X 8 MM MISC (INSULIN PEN NEEDLE) USE TO ADMINISTER HUMALOG INJECTION three times a day  #100 x 11   Entered by:   Orlan Leavens   Authorized by:   Minus Breeding MD   Signed by:   Orlan Leavens on 08/06/2008   Method used:   Telephoned to ...       [facility]* (retail)       [address]       [place], [place]  [address]       Ph: [phone]       Fax: [phone]   RxID:   [id number]

## 2010-06-02 NOTE — Assessment & Plan Note (Signed)
Summary: FU / $50 /NWS   Vital Signs:  Patient Profile:   65 Years Old Male Weight:      236.4 pounds O2 Sat:      97 % O2 treatment:    Room Air Temp:     98.0 degrees F oral Pulse rate:   90 / minute BP sitting:   142 / 80  (left arm) Cuff size:   regular  Pt. in pain?   no  Vitals Entered By: Orlan Leavens (June 14, 2008 8:04 AM)                  PCP:  luking  Chief Complaint:  FOLLOW-UP.  History of Present Illness: pt had a recent [place], now better.  he brings a record of his cbg's which i have reviewed today.  it ranges from 128-250, with no trend trhoughout the day, except it is higher at hs.    Prior Medications Reviewed Using: List Brought by Patient  Updated Prior Medication List: NORVASC 5 MG  TABS (AMLODIPINE BESYLATE) take 1 tab by mouth qd ASPIRIN 325 MG  TBEC (ASPIRIN) take 1 by mouth qd CELEXA 40 MG  TABS (CITALOPRAM HYDROBROMIDE) take 1 by mouth qd FISH OIL 1000 MG  CAPS (OMEGA-3 FATTY ACIDS) take 1 by mouth qd METFORMIN HCL 1000 MG  TABS (METFORMIN HCL) take 1 by mouth two times a day qd METOPROLOL SUCCINATE 100 MG  TB24 (METOPROLOL SUCCINATE) take 1 by mouth qd PLAVIX 75 MG  TABS (CLOPIDOGREL BISULFATE) take 1 by mouth qd VITAMIN B-12 CR 1000 MCG  TBCR (CYANOCOBALAMIN) take 1 by mouth qd ALPRAZOLAM 1 MG  TABS (ALPRAZOLAM) take 1 at bedtime prn HYDROCODONE-ACETAMINOPHEN 7.5-325 MG  TABS (HYDROCODONE-ACETAMINOPHEN) take 1 by mouth q 4-6 hours prn CLOMID 50 MG  TABS (CLOMIPHENE CITRATE) 1/4 tab three times weekly (m,w,f) HUMALOG MIX 50/50 KWIKPEN 50-50 % SUSP (INSULIN LISPRO PROT & LISPRO) 55 units bid OMEPRAZOLE 40 MG CPDR (OMEPRAZOLE) TAKE 1 by mouth QD DOXAZOSIN MESYLATE 4 MG TABS (DOXAZOSIN MESYLATE) TAKE 1 by mouth QD LIPITOR 80 MG TABS (ATORVASTATIN CALCIUM) TAKE 1 by mouth QD AGGRENOX 25-200 MG XR12H-CAP (ASPIRIN-DIPYRIDAMOLE) TAke 2 by mouth once daily for 2 weeks then start back in plavix & asprin ALPRAZOLAM 0.25 MG TBDP (ALPRAZOLAM) take 1  by mouth qd  Current Allergies (reviewed today): ! PENICILLIN  Past Medical History:    Reviewed history from 12/07/2007 and no changes required:       Benign prostatic hypertrophy       sleep study was recently negative for sleep apnea, per the patient's report.       HYPOGONADISM, MALE (ICD-257.2)       BENIGN PROSTATIC HYPERTROPHY (ICD-600.00)       HYPERTENSION (ICD-401.9)       GERD (ICD-530.81)       DIABETES MELLITUS, TYPE II (ICD-250.00)       DEPRESSION (ICD-311)       CORONARY ARTERY DISEASE (ICD-414.00)       ALLERGIC RHINITIS (ICD-477.9)     Review of Systems  The patient denies hypoglycemia.     Physical Exam  General:     obese.   Pulses:     dorsalis pedis intact bilat. Extremities:     no deformity.  no ulcer on the feet.  feet are of normal color and temp.  no edema  Neurologic:     sensation is intact to touch on the feet  Additional Exam:  A1C%                 [H]  9.4 %     Impression & Recommendations:  Problem # 1:  DIABETES MELLITUS, TYPE II (ICD-250.00) needs increased rx   Medications Added to Medication List This Visit: 1)  Norvasc 5 Mg Tabs (Amlodipine besylate) .... Take 1 tab by mouth qd 2)  Alprazolam 1 Mg Tabs (Alprazolam) .... Take 1 at bedtime prn 3)  Humalog Mix 50/50 Kwikpen 50-50 % Susp (Insulin lispro prot & lispro) .... 55 units am and 65 units pm 4)  Omeprazole 40 Mg Cpdr (Omeprazole) .... Take 1 by mouth qd 5)  Doxazosin Mesylate 4 Mg Tabs (Doxazosin mesylate) .... Take 1 by mouth qd 6)  Lipitor 80 Mg Tabs (Atorvastatin calcium) .... Take 1 by mouth qd 7)  Aggrenox 25-200 Mg Xr12h-cap (Aspirin-dipyridamole) .... Take 2 by mouth once daily for 2 weeks then start back in plavix & asprin 8)  Alprazolam 0.25 Mg Tbdp (Alprazolam) .... Take 1 by mouth qd   Patient Instructions: 1)  increase humalog 50/50 to 60 units am and 70 units pm. 2)  Please schedule a follow-up appointment in 3 months. 3)  check your blood  glucose 2 times a day.  vary the time of day between before the 3 meals and at bedtime.  also check if you feel as though your glucose might be very high or too low.  bring a record of this to your doctor appointments   Prescriptions: HUMALOG MIX 50/50 KWIKPEN 50-50 % SUSP (INSULIN LISPRO PROT & LISPRO) 55 units am and 65 units pm  #1 box x 11   Entered and Authorized by:   Minus Breeding MD   Signed by:   Minus Breeding MD on 06/14/2008   Method used:   Print then Give to Patient   RxID:   [id number] CLOMID 50 MG  TABS (CLOMIPHENE CITRATE) 1/4 tab three times weekly (m,w,f)  #5 x 11   Entered and Authorized by:   Minus Breeding MD   Signed by:   Minus Breeding MD on 06/14/2008   Method used:   Electronically to        [facility] (retail)       [address]       [place], [place]  [address]       Ph: [phone]       Fax: [phone]   RxID:   [id number]

## 2010-06-02 NOTE — Miscellaneous (Signed)
Summary: lisinopril refill  Clinical Lists Changes  Medications: Rx of LISINOPRIL 5 MG TABS (LISINOPRIL) Take one tablet by mouth daily;  #30 x 2;  Signed;  Entered by: Teressa Lower RN;  Authorized by: Kathlen Brunswick, MD, [facility];  Method used: Electronically to [facility]*, [address], [place], [place], [place]  [address], Ph: [phone] or [phone], Fax: [phone]    Prescriptions: LISINOPRIL 5 MG TABS (LISINOPRIL) Take one tablet by mouth daily  #30 x 2   Entered by:   Teressa Lower RN   Authorized by:   Kathlen Brunswick, MD, [facility]   Signed by:   Teressa Lower RN on 05/20/2009   Method used:   Electronically to        [facility]* (retail)       [address]       [place], [place]  [address]       Ph: [phone] or [phone]       Fax: [phone]   RxID:   [id number]

## 2010-06-02 NOTE — Letter (Signed)
Summary: Device-Delinquent Phone Journalist, newspaper, Main Office  [address]   [place], [place] [address]   Phone: [phone]  Fax: [phone]     October 03, 2008 MRN: [id number]   ZAREK RELPH [address] [place], [place]  [address]   Dear Mr. EDGETT,  According to our records, you were scheduled for a device phone transmission on  Sep 30, 2008.     We did not receive any results from this check.  If you transmitted on your scheduled day, please call us to help troubleshoot your system.  If you forgot to send your transmission, please send one upon receipt of this letter.  Thank you,   [facility]

## 2010-06-02 NOTE — Assessment & Plan Note (Signed)
Summary: 3 MTH FU STC   Vital Signs:  Patient profile:   65 year old male Height:      69 inches Weight:      230.50 pounds O2 Sat:      97 % on Room air Temp:     97.2 degrees F oral Pulse rate:   99 / minute BP sitting:   132 / 76  (left arm) Cuff size:   large  Vitals Entered By: Margaret Pyle, CMA (October 17, 2009 9:39 AM)  O2 Flow:  Room air CC: 3 mth F/U -DM   Primary Provider:  Dr.Scott Luking  CC:  3 mth F/U -DM.  History of Present Illness: pt states he feels well in general.  he brings a record of his cbg's which i have reviewed today.  it varies from 107-240.  it is in general higher as the day goes on.  he has lost a few lbs, due to his efforts.  Current Medications (verified): 1)  Norvasc 5 Mg  Tabs (Amlodipine Besylate) .... Take 1 /2 Tab Daily 2)  Aspir-Low 81 Mg Tbec (Aspirin) .... Take 1 Tab Daily 3)  Fish Oil 1000 Mg  Caps (Omega-3 Fatty Acids) .... Take 1 By Mouth Qd 4)  Metformin Hcl 1000 Mg  Tabs (Metformin Hcl) .... Take 1 By Mouth Two Times A Day Qd 5)  Alprazolam 1 Mg  Tabs (Alprazolam) .... Take 1 At Bedtime Prn 6)  Hydrocodone-Acetaminophen 7.5-325 Mg  Tabs (Hydrocodone-Acetaminophen) .... Take As Needed 7)  Clomid 50 Mg  Tabs (Clomiphene Citrate) .... Take 1/2 Tab Daily 8)  Omeprazole 20 Mg Cpdr (Omeprazole) .... Take 1 Tab Daily 9)  Doxazosin Mesylate 4 Mg Tabs (Doxazosin Mesylate) .... Take 1 By Mouth Qd 10)  Lipitor 80 Mg Tabs (Atorvastatin Calcium) .... Take 1 By Mouth Qd 11)  Fexofenadine Hcl 180 Mg Tabs (Fexofenadine Hcl) .Marland Kitchen.. 1 Daily 12)  Fluticasone Propionate 50 Mcg/act Susp (Fluticasone Propionate) .Marland Kitchen.. 1 Daily 13)  Tamsulosin Hcl 0.4 Mg Caps (Tamsulosin Hcl) .Marland Kitchen.. 1 Tab Qd 14)  Diabetic Shoes .Marland KitchenMarland Kitchen. 250.01 15)  Vitamin B-12 1000 Mcg Tabs (Cyanocobalamin) .... Take 1 Tab Daily 16)  Citalopram Hydrobromide 40 Mg Tabs (Citalopram Hydrobromide) .... Take 1 Tab Daily 17)  Lisinopril 10 Mg Tabs (Lisinopril) .... Take One Tablet By Mouth  Daily 18)  Humulin 70/30 70-30 % Susp (Insulin Isophane & Regular) .Marland KitchenMarland KitchenMarland Kitchen 140 Units Am and 50 Units Pm 19)  Nitrostat 0.4 Mg Subl (Nitroglycerin) .... Take As Directed For Chest Pain 20)  Plavix 75 Mg Tabs (Clopidogrel Bisulfate) .... Take One Tablet By Mouth Daily  Allergies (verified): 1)  ! Penicillin  Past History:  Past Medical History: Last updated: 03/26/2009 ASCVD: 70% mid left anterior descending lesion on cath in 06/1995; left anterior descending DES placed in 8/03 and RCA stent in 9/03; captain 3/05 revealed 90% second marginal for which PCI was performed, 70% PDA and total obstruction of the first diagonal and marginal; sudden cardiac death in [place] a casino in 10/2003 for which automatic implantable cardiac defibrillator placed; negative stress nuclear study in 01/2006 with ejection fraction of 52% ________________________________________ Hypertension Hyperlipidemia Tobacco abuse: 100-pack-year consumption; cigarettes discontinued 2003; all tobacco products in 2008 Cerebrovascular disease: Transient ischemic attack in 05/2008; carotid ultrasound-plaque without focal disease AODM-insulin requirement Degenerative joint disease-C-spine fusion in 2002 Erectile dysfunction Anxiety and depression Benign prostatic hypertrophy sleep study was recently negative for sleep apnea, per the patient's report. HYPOGONADISM, MALE (ICD-257.2) BENIGN PROSTATIC HYPERTROPHY (ICD-600.00)  GERD (ICD-530.81) DIABETES MELLITUS, TYPE II (ICD-250.00) DEPRESSION (ICD-311) CORONARY ARTERY DISEASE (ICD-414.00) ALLERGIC RHINITIS (ICD-477.9)  Review of Systems  The patient denies hypoglycemia.    Physical Exam  General:  normal appearance.   Pulses:  dorsalis pedis intact bilat.   Extremities:  no deformity.  no ulcer on the feet.  feet are of normal color and temp.  no edema there is a healed abrasion at the right anterior tibial area. mycotic toenails.   Neurologic:  sensation is intact  to touch on the feet    Impression & Recommendations:  Problem # 1:  DIABETES MELLITUS, TYPE II (ICD-250.00) needs increased rx  Medications Added to Medication List This Visit: 1)  Humulin 70/30 70-30 % Susp (Insulin isophane & regular) .Marland Kitchen.. 160 units am and 50 units pm  Other Orders: TLB-Testosterone, Total (84403-TESTO) Est. Patient Level III (04540)  Patient Instructions: 1)  blood tests are being ordered for you today.  please call [phone] to hear your test results. 2)  pending the test results, please increase humulin 70/30, to 160 units am and 50 units befor the evening meal. 3)  Please schedule a follow-up appointment in 3 months. 4)  check your blood sugar 2 times a day.  vary the time of day when you check, between before the 3 meals, and at bedtime.  also check if you have symptoms of your blood sugar being too high or too low.  please keep a record of the readings and bring it to your next appointment here.  please call us sooner if you are having low blood sugar episodes.

## 2010-06-02 NOTE — Assessment & Plan Note (Signed)
Summary: PER PT  3 MTH FU---$50---STC   Vital Signs:  Patient profile:   65 year old male Height:      65 inches Weight:      244 pounds BMI:     40.75 Temp:     97.7 degrees F oral Pulse rate:   91 / minute BP sitting:   116 / 62  (left arm) Cuff size:   large  Vitals Entered By: Bill Salinas CMA (Sep 12, 2008 8:15 AM) CC: follow-up visit/ ab   Primary Provider:  luking  CC:  follow-up visit/ ab.  History of Present Illness: pt says he feels well in general except for "allergy priblems."  no cbg record, but states cbg's are "up and down."  he had 1 episode of hypoglycemia at hs.  says it is highest in the late afternoon (200's).  Current Medications (verified): 1)  Norvasc 5 Mg  Tabs (Amlodipine Besylate) .... Take 1 Tab By Mouth Qd 2)  Aspirin 325 Mg  Tbec (Aspirin) .... Take 1 By Mouth Qd 3)  Celexa 40 Mg  Tabs (Citalopram Hydrobromide) .... Take 1 By Mouth Qd 4)  Fish Oil 1000 Mg  Caps (Omega-3 Fatty Acids) .... Take 1 By Mouth Qd 5)  Metformin Hcl 1000 Mg  Tabs (Metformin Hcl) .... Take 1 By Mouth Two Times A Day Qd 6)  Metoprolol Succinate 100 Mg  Tb24 (Metoprolol Succinate) .... Take 1 By Mouth Qd 7)  Plavix 75 Mg  Tabs (Clopidogrel Bisulfate) .... Take 1 By Mouth Qd 8)  Vitamin B-12 Cr 1000 Mcg  Tbcr (Cyanocobalamin) .... Take 1 By Mouth Qd 9)  Alprazolam 1 Mg  Tabs (Alprazolam) .... Take 1 At Bedtime Prn 10)  Hydrocodone-Acetaminophen 7.5-325 Mg  Tabs (Hydrocodone-Acetaminophen) .... Take 1 By Mouth Q 4-6 Hours Prn 11)  Clomid 50 Mg  Tabs (Clomiphene Citrate) .... 1/4 Tab Three Times Weekly (M,w,f) 12)  Humalog Mix 50/50 Kwikpen 50-50 % Susp (Insulin Lispro Prot & Lispro) .... 55 Units Am and 65 Units Pm 13)  Omeprazole 40 Mg Cpdr (Omeprazole) .... Take 1 By Mouth Qd 14)  Doxazosin Mesylate 4 Mg Tabs (Doxazosin Mesylate) .... Take 1 By Mouth Qd 15)  Lipitor 80 Mg Tabs (Atorvastatin Calcium) .... Take 1 By Mouth Qd 16)  Aggrenox 25-200 Mg Xr12h-Cap  (Aspirin-Dipyridamole) .... Take 2 By Mouth Once Daily For 2 Weeks Then Start Back in Plavix & Asprin 17)  Alprazolam 0.25 Mg Tbdp (Alprazolam) .... Take 1 By Mouth Qd 18)  Unifine Pentips 31g X 8 Mm Misc (Insulin Pen Needle) .... Use To Administer Humalog Injection Three Times A Day 19)  Ventolin Hfa 108 (90 Base) Mcg/act Aers (Albuterol Sulfate) .... 2 Puffs Q4h As Needed 20)  Nasonex 50 Mcg/act Susp (Mometasone Furoate) .... 2 Sprays Each Nostril Qam 21)  Aggrenox 25-200 Mg Xr12h-Cap (Aspirin-Dipyridamole) .Marland Kitchen.. 1 Tab Two Times A Day 22)  Fexofenadine Hcl 180 Mg Tabs (Fexofenadine Hcl) .Marland Kitchen.. 1 Daily 23)  Fluticasone Propionate 50 Mcg/act Susp (Fluticasone Propionate) .Marland Kitchen.. 1 Daily  Allergies (verified): 1)  ! Penicillin  Past History:  Past Medical History:    Benign prostatic hypertrophy    sleep study was recently negative for sleep apnea, per the patient's report.    HYPOGONADISM, MALE (ICD-257.2)    BENIGN PROSTATIC HYPERTROPHY (ICD-600.00)    HYPERTENSION (ICD-401.9)    GERD (ICD-530.81)    DIABETES MELLITUS, TYPE II (ICD-250.00)    DEPRESSION (ICD-311)    CORONARY ARTERY DISEASE (ICD-414.00)  ALLERGIC RHINITIS (ICD-477.9) (12/07/2007)  Review of Systems  The patient denies syncope.    Physical Exam  General:  obese.   Neck:  Supple without thyroid enlargement or tenderness. No cervical lymphadenopathy, neck masses or tracheal deviation.    Impression & Recommendations:  Problem # 1:  DIABETES MELLITUS, TYPE II (ICD-250.00) therapy limited by noncompliance with cbg monitoring he may do better with a simpler regimen  Medications Added to Medication List This Visit: 1)  Humalog Mix 50/50 Kwikpen 50-50 % Susp (Insulin lispro prot & lispro) .... 80 units am and 50 units pm 2)  Ventolin Hfa 108 (90 Base) Mcg/act Aers (Albuterol sulfate) .... 2 puffs q4h as needed 3)  Nasonex 50 Mcg/act Susp (Mometasone furoate) .... 2 sprays each nostril qam 4)  Aggrenox 25-200 Mg  Xr12h-cap (Aspirin-dipyridamole) .Marland Kitchen.. 1 tab two times a day 5)  Fexofenadine Hcl 180 Mg Tabs (Fexofenadine hcl) .Marland Kitchen.. 1 daily 6)  Fluticasone Propionate 50 Mcg/act Susp (Fluticasone propionate) .Marland Kitchen.. 1 daily 7)  Cialis 20 Mg Tabs (Tadalafil) .... For as needed use  Other Orders: Est. Patient Level III (13086)  Patient Instructions: 1)  check your blood glucose 2 times a day.  vary the time of day between before the 3 meals and at bedtime.  also check if you feel as though your glucose might be very high or too low.  bring a record of this to your doctor appointments 2)  change humalog 50/50, to 80 units am and 50 units pm 3)  Please schedule a follow-up appointment in 1 month. Prescriptions: CIALIS 20 MG TABS (TADALAFIL) for as needed use  #10 x 11   Entered and Authorized by:   Minus Breeding MD   Signed by:   Minus Breeding MD on 09/12/2008   Method used:   Electronically to        [facility]* (retail)       [address]       [place], [place]  [address]       Ph: [phone]       Fax: [phone]   RxID:   [id number]

## 2010-06-02 NOTE — Assessment & Plan Note (Signed)
Summary: 3 WEEK FU/$50/JSS   Vital Signs:  Patient Profile:   65 Years Old Male Weight:      232.6 pounds Temp:     97.4 degrees F oral Pulse rate:   100 / minute BP sitting:   110 / 66  (right arm) Cuff size:   regular  Pt. in pain?   no  Vitals Entered By: Orlan Leavens (December 29, 2007 1:59 PM)                  PCP:  luking  Chief Complaint:  3 week follow-up.  History of Present Illness: pt says he is feeling well.  he brings a record of his cbg's checked at various times of day.  it varies 65-240.  it is lowest lunch early am, and the afternoon, and highest at hs    Current Allergies: ! PENICILLIN  Past Medical History:    Reviewed history from 12/07/2007 and no changes required:       Benign prostatic hypertrophy       sleep study was recently negative for sleep apnea, per the patient's report.       HYPOGONADISM, MALE (ICD-257.2)       BENIGN PROSTATIC HYPERTROPHY (ICD-600.00)       HYPERTENSION (ICD-401.9)       GERD (ICD-530.81)       DIABETES MELLITUS, TYPE II (ICD-250.00)       DEPRESSION (ICD-311)       CORONARY ARTERY DISEASE (ICD-414.00)       ALLERGIC RHINITIS (ICD-477.9)     Review of Systems  The patient denies syncope.     Physical Exam  General:     well developed, well nourished, in no acute distress Psych:     alert and cooperative; normal mood and affect; normal attention span and concentration    Impression & Recommendations:  Problem # 1:  DIABETES MELLITUS, TYPE II (ICD-250.00) needs a simple regimen  Medications Added to Medication List This Visit: 1)  Humalog Mix 50/50 50-50 % Susp (Insulin lispro prot & lispro) .... 50 units am and 30 units pm   Patient Instructions: 1)  change current insulin to humalog 50/50 50 units am and 30 pm 2)  if all glucoses are elevated, increase to 60 am and 30 pm 3)  ret 21d   Prescriptions: HUMALOG MIX 50/50 50-50 % SUSP (INSULIN LISPRO PROT & LISPRO) 50 units am and 30 units pm  #3  x 11   Entered and Authorized by:   Minus Breeding MD   Signed by:   Minus Breeding MD on 12/29/2007   Method used:   Electronically to        [facility]* (retail)       [address]       [place], [place]  [address]       Ph: [phone]       Fax: [phone]   RxID:   [id number]  ]

## 2010-06-02 NOTE — Progress Notes (Signed)
Summary: drug interaction   Phone Note From Pharmacy Call back at [phone]   Request: Speak with Nurse Summary of Call: PRILOSIC 40 two times a day, pt was put on this from PCP and pharmacy is calling to make sure this is okay with [place] since he is on plavix. Initial call taken by: Faythe Ghee,  Sep 22, 2009 9:31 AM  Follow-up for Phone Call        omeprazole and plavix already on pt med list for over 6 months Follow-up by: Teressa Lower RN,  Sep 22, 2009 10:16 AM

## 2010-06-02 NOTE — Progress Notes (Signed)
Summary: Rx refill req  Phone Note Refill Request Message from:  Patient on March 02, 2010 2:50 PM  Refills Requested: Medication #1:  HUMULIN 70/30 70-30 % SUSP 140 units am and 50 units pm   Dosage confirmed as above?Dosage Confirmed   Supply Requested: 6 months  Method Requested: Electronic Initial call taken by: Margaret Pyle, CMA,  March 02, 2010 2:51 PM    Prescriptions: HUMULIN 70/30 70-30 % SUSP (INSULIN ISOPHANE & REGULAR) 140 units am and 50 units pm  #57mth x 5   Entered by:   Margaret Pyle, CMA   Authorized by:   Minus Breeding MD   Signed by:   Margaret Pyle, CMA on 03/02/2010   Method used:   Electronically to        [facility] [address] [phone]* (retail)       [address], [place]  [address]       Ph: [phone]       Fax: [phone]   RxID:   [id number]

## 2010-06-02 NOTE — Assessment & Plan Note (Signed)
Summary: 2 MTH FU  $50   STC   Vital Signs:  Patient profile:   65 year old male Height:      65 inches Weight:      244 pounds BMI:     40.75 O2 Sat:      96 % on Room air Temp:     97.2 degrees F oral Pulse rate:   87 / minute BP sitting:   138 / 66  (left arm) Cuff size:   large  Vitals Entered By: Bill Salinas CMA (December 12, 2008 9:56 AM)  O2 Flow:  Room air CC: Pt here for a follow up office visit and has record of his blood sugars which have been consistently high. From Mid 200's to low 400/ ab s  Primary Provider:  luking  CC:  Pt here for a follow up office visit and has record of his blood sugars which have been consistently high. From Mid 200's to low 400/ ab.  History of Present Illness: pt states he feels well in general.  he brings a record of his cbg's which i have reviewed today.  it varies videly from 79-500, with no pattern throughout the day.  the 79 appears to have been when a meal was missed or delayed.  he continues to have ed sxs. he takes clomiphne as rx'ed.  Current Medications (verified): 1)  Norvasc 5 Mg  Tabs (Amlodipine Besylate) .... Take 1 Tab By Mouth Qd 2)  Aspirin 325 Mg  Tbec (Aspirin) .... Take 1 By Mouth Qd 3)  Celexa 40 Mg  Tabs (Citalopram Hydrobromide) .... Take 1 By Mouth Qd 4)  Fish Oil 1000 Mg  Caps (Omega-3 Fatty Acids) .... Take 1 By Mouth Qd 5)  Metformin Hcl 1000 Mg  Tabs (Metformin Hcl) .... Take 1 By Mouth Two Times A Day Qd 6)  Metoprolol Succinate 100 Mg  Tb24 (Metoprolol Succinate) .... Take 1 By Mouth Qd 7)  Plavix 75 Mg  Tabs (Clopidogrel Bisulfate) .... Take 1 By Mouth Qd 8)  Vitamin B-12 Cr 1000 Mcg  Tbcr (Cyanocobalamin) .... Take 1 By Mouth Qd 9)  Alprazolam 1 Mg  Tabs (Alprazolam) .... Take 1 At Bedtime Prn 10)  Hydrocodone-Acetaminophen 7.5-325 Mg  Tabs (Hydrocodone-Acetaminophen) .... Take 1 By Mouth Q 4-6 Hours Prn 11)  Clomid 50 Mg  Tabs (Clomiphene Citrate) .... 1/4 Tab Three Times Weekly (M,w,f) 12)  Humalog Mix  50/50 Kwikpen 50-50 % Susp (Insulin Lispro Prot & Lispro) .... 75 Units Am and 45 Units Pm 13)  Omeprazole 40 Mg Cpdr (Omeprazole) .... Take 1 By Mouth Qd 14)  Doxazosin Mesylate 4 Mg Tabs (Doxazosin Mesylate) .... Take 1 By Mouth Qd 15)  Lipitor 80 Mg Tabs (Atorvastatin Calcium) .... Take 1 By Mouth Qd 16)  Aggrenox 25-200 Mg Xr12h-Cap (Aspirin-Dipyridamole) .... Take 2 By Mouth Once Daily For 2 Weeks Then Start Back in Plavix & Asprin 17)  Alprazolam 0.25 Mg Tbdp (Alprazolam) .... Take 1 By Mouth Qd 18)  Unifine Pentips 31g X 8 Mm Misc (Insulin Pen Needle) .... Use To Administer Humalog Injection Three Times A Day 19)  Ventolin Hfa 108 (90 Base) Mcg/act Aers (Albuterol Sulfate) .... 2 Puffs Q4h As Needed 20)  Nasonex 50 Mcg/act Susp (Mometasone Furoate) .... 2 Sprays Each Nostril Qam 21)  Aggrenox 25-200 Mg Xr12h-Cap (Aspirin-Dipyridamole) .Marland Kitchen.. 1 Tab Two Times A Day 22)  Fexofenadine Hcl 180 Mg Tabs (Fexofenadine Hcl) .Marland Kitchen.. 1 Daily 23)  Fluticasone Propionate 50 Mcg/act Susp (  Fluticasone Propionate) .Marland Kitchen.. 1 Daily 24)  Cialis 20 Mg Tabs (Tadalafil) .... For As Needed Use 25)  Sulfamethoxazole-Trimethoprim 400-80 Mg/68ml Soln (Sulfamethoxazole-Trimethoprim) .Marland Kitchen.. 1 Tab Two Times A Day  Allergies (verified): 1)  ! Penicillin  Past History:  Past Medical History: Last updated: 12/07/2007 Benign prostatic hypertrophy sleep study was recently negative for sleep apnea, per the patient's report. HYPOGONADISM, MALE (ICD-257.2) BENIGN PROSTATIC HYPERTROPHY (ICD-600.00) HYPERTENSION (ICD-401.9) GERD (ICD-530.81) DIABETES MELLITUS, TYPE II (ICD-250.00) DEPRESSION (ICD-311) CORONARY ARTERY DISEASE (ICD-414.00) ALLERGIC RHINITIS (ICD-477.9)  Review of Systems  The patient denies syncope.    Physical Exam  General:  obese.  no distress  Pulses:  dorsalis pedis intact bilat.  Extremities:  no deformity.  no ulcer on the feet.  feet are of normal color and temp.  no edema  Neurologic:   sensation is intact to touch on the feet  Additional Exam:  Hemoglobin A1C       [H]  9.7 %                       4.6-6.5 Testosterone         [L]  280.64 ng/dL    Impression & Recommendations:  Problem # 1:  DIABETES MELLITUS, TYPE II (ICD-250.00) needs increased rx  Problem # 2:  HYPOGONADISM, MALE (ICD-257.2) needs increased rx  Problem # 3:  ERECTILE DYSFUNCTION, ORGANIC (ICD-607.84) possibly due to #2  Medications Added to Medication List This Visit: 1)  Clomid 50 Mg Tabs (Clomiphene citrate) .... 1/4 tab qd 2)  Levemir 100 Unit/ml Soln (Insulin detemir) .Marland Kitchen.. 110 units qam  Other Orders: TLB-A1C / Hgb A1C (Glycohemoglobin) (83036-A1C) TLB-Testosterone, Total (84403-TESTO) Est. Patient Level IV (04540)  Patient Instructions: 1)  change current insulin to levemir 110 units each am. 2)  return 6 weeks 3)  it is very important to keep good control of blood pressure and cholesterol, especially in those with diabetes.  please discuss these with your doctor.  you should take an aspirin every day, unless you have been advised by a doctor not to. 4)  we discussed the importance of diet and exercise therapy and the risks of diabetes.  you should see an eye doctor every year. 5)  check your blood sugar 2 times a day.  vary the time of day when you check, between before the 3 meals, and at bedtime.  also check if you have symptoms of your blood sugar being too high or too low.  please keep a record of the readings and bring it to your next appointment here.  please call us sooner if you are having low blood sugar episodes. 6)  (update: i left message on phone-tree:  rx as we discussed) 7)  increase clomid to 1/4 of 50 mg once daily 8)  if cbg is high on new rx, increase levemir Prescriptions: CLOMID 50 MG  TABS (CLOMIPHENE CITRATE) 1/4 tab qd  #10 x 11   Entered and Authorized by:   Minus Breeding MD   Signed by:   Minus Breeding MD on 12/14/2008   Method used:   Electronically to         [facility]* (retail)       [address]       [place], [place]  [address]       Ph: [phone]       Fax: [phone]   RxID:  [id number] LEVEMIR 100 UNIT/ML SOLN (INSULIN DETEMIR) 110 units qam  #4 vials x 11   Entered and Authorized by:   Minus Breeding MD   Signed by:   Minus Breeding MD on 12/12/2008   Method used:   Electronically to        [facility]* (retail)       [address]       [place], [place]  [address]       Ph: [phone]       Fax: [phone]   RxID:   [id number]

## 2010-06-02 NOTE — Letter (Signed)
Summary: RAD REPORT [place] ABD COMP  RAD REPORT [place] ABD COMP   Imported By: Rexene Alberts 04/08/2010 14:09:22  _____________________________________________________________________  External Attachment:    Type:   Image     Comment:   External Document

## 2010-06-02 NOTE — Progress Notes (Signed)
Summary: humalog mix  Phone Note From Pharmacy   Caller: [facility]* Summary of Call: Recieved rx for Humalog mix 50/50 take 55 units q am and 65 units pm. Want to know can we dispense 2 boxes instead og 1 so pt will have a 25 day supply. This will hekp with copay. Initial call taken by: Orlan Leavens,  June 17, 2008 1:42 PM  Follow-up for Phone Call        Resent new prescription Follow-up by: Orlan Leavens,  June 17, 2008 1:43 PM      Prescriptions: HUMALOG MIX 50/50 KWIKPEN 50-50 % SUSP (INSULIN LISPRO PROT & LISPRO) 55 units am and 65 units pm  #2 box x 11   Entered by:   Orlan Leavens   Authorized by:   Minus Breeding MD   Signed by:   Orlan Leavens on 06/17/2008   Method used:   Electronically to        [facility]* (retail)       [address]       [place], [place]  [address]       Ph: [phone]       Fax: [phone]   RxID:   [id number]

## 2010-06-02 NOTE — Assessment & Plan Note (Signed)
Summary: 1 MO ROV / NWS   Vital Signs:  Patient profile:   65 year old male Height:      65 inches (165.10 cm) Weight:      242.13 pounds (110.06 kg) O2 Sat:      97 % on Room air Temp:     97.3 degrees F (36.28 degrees C) oral Pulse rate:   97 / minute BP sitting:   164 / 88  (left arm) Cuff size:   large  Vitals Entered By: Josph Macho CMA (March 03, 2009 9:39 AM)  O2 Flow:  Room air CC: 1 month follow up/ pt states he is on antibiotics and he will call back with the meds and dosages/ CF Is Patient Diabetic? Yes   Primary Provider:  luking  CC:  1 month follow up/ pt states he is on antibiotics and he will call back with the meds and dosages/ CF.  History of Present Illness: pt likes the lower cost of his new insulin.  he had a few episodes of cbg's in the late am, when he was sick.  he brings a record of his cbg's which i have reviewed today.  it varies from the 80's noted above to the 200's at hs and in am. pt states pain at the plantar aspects of both feet, worse at work. pt feels better since recent illness.  he received abx from dr Gerda Diss.  Current Medications (verified): 1)  Norvasc 5 Mg  Tabs (Amlodipine Besylate) .... Take 1 Tab By Mouth Qd 2)  Aspirin 325 Mg  Tbec (Aspirin) .... Take 1 By Mouth Qd 3)  Celexa 40 Mg  Tabs (Citalopram Hydrobromide) .... Take 1 By Mouth Qd 4)  Fish Oil 1000 Mg  Caps (Omega-3 Fatty Acids) .... Take 1 By Mouth Qd 5)  Metformin Hcl 1000 Mg  Tabs (Metformin Hcl) .... Take 1 By Mouth Two Times A Day Qd 6)  Plavix 75 Mg  Tabs (Clopidogrel Bisulfate) .... Take 1 By Mouth Qd 7)  Alprazolam 1 Mg  Tabs (Alprazolam) .... Take 1 At Bedtime Prn 8)  Hydrocodone-Acetaminophen 7.5-325 Mg  Tabs (Hydrocodone-Acetaminophen) .... Take 1 By Mouth Q 4-6 Hours Prn 9)  Clomid 50 Mg  Tabs (Clomiphene Citrate) .... 1/4 Tab Qd 10)  Omeprazole 40 Mg Cpdr (Omeprazole) .... Take 1 By Mouth Qd 11)  Doxazosin Mesylate 4 Mg Tabs (Doxazosin Mesylate) .... Take 1  By Mouth Qd 12)  Lipitor 80 Mg Tabs (Atorvastatin Calcium) .... Take 1 By Mouth Qd 13)  Aggrenox 25-200 Mg Xr12h-Cap (Aspirin-Dipyridamole) .... Take 2 By Mouth Once Daily For 2 Weeks Then Start Back in Plavix & Asprin 14)  Unifine Pentips 31g X 8 Mm Misc (Insulin Pen Needle) .... Use To Administer Humalog Injection Three Times A Day 15)  Fexofenadine Hcl 180 Mg Tabs (Fexofenadine Hcl) .Marland Kitchen.. 1 Daily 16)  Fluticasone Propionate 50 Mcg/act Susp (Fluticasone Propionate) .Marland Kitchen.. 1 Daily 17)  Cialis 20 Mg Tabs (Tadalafil) .... For As Needed Use 18)  Tamsulosin Hcl 0.4 Mg Caps (Tamsulosin Hcl) .Marland Kitchen.. 1 Tab Qd 19)  Protonix 40 Mg Tbec (Pantoprazole Sodium) .Marland Kitchen.. 1 Tab Qhs 20)  Relion 70/30 70-30 % Susp (Insulin Isophane & Regular) .Marland Kitchen.. 160 Units Am and 20 Units Pm  Allergies (verified): 1)  ! Penicillin  Past History:  Past Medical History: Last updated: 12/07/2007 Benign prostatic hypertrophy sleep study was recently negative for sleep apnea, per the patient's report. HYPOGONADISM, MALE (ICD-257.2) BENIGN PROSTATIC HYPERTROPHY (ICD-600.00) HYPERTENSION (ICD-401.9)  GERD (ICD-530.81) DIABETES MELLITUS, TYPE II (ICD-250.00) DEPRESSION (ICD-311) CORONARY ARTERY DISEASE (ICD-414.00) ALLERGIC RHINITIS (ICD-477.9)  Review of Systems  The patient denies fever.         he has wheezing  Physical Exam  General:  obese.  no distress  Lungs:  Clear to auscultation bilaterally. Normal respiratory effort.  Pulses:  dorsalis pedis intact bilat.   Extremities:  no deformity.  no ulcer on the feet.  feet are of normal color and temp.  no edema  Neurologic:  sensation is intact to touch on the feet    Impression & Recommendations:  Problem # 1:  DIABETES MELLITUS, TYPE II (ICD-250.00) needs increased rx  Problem # 2:  foot pain  Problem # 3:  persistent wheezing after recent uri  Medications Added to Medication List This Visit: 1)  Relion 70/30 70-30 % Susp (Insulin isophane & regular) .Marland Kitchen..  160 units am and 40 units pm 2)  Diabetic Shoes  .Marland KitchenMarland Kitchen. 250.01 3)  Symbicort 80-4.5 Mcg/act Aero (Budesonide-formoterol fumarate) .Marland Kitchen.. 1 puff bid  Other Orders: Est. Patient Level IV ([address])  Patient Instructions: 1)  increase reli-on 70/30, to 160 units am and 40 units pm. 2)  Please schedule a follow-up appointment in 1 month. 3)  i gave pt a prescription for diabetes shoes, but i told him that i do not see any medicare criteria that he could use to get medicare to pay for them 4)  symbicort-80 1 puff two times a day.  rinse mouth after using. Prescriptions: RELION 70/30 70-30 % SUSP (INSULIN ISOPHANE & REGULAR) 160 units am and 40 units pm  #7 vials x 11   Entered and Authorized by:   Minus Breeding MD   Signed by:   Minus Breeding MD on 03/03/2009   Method used:   Electronically to        [facility] [address] [facility] (retail)       [address]       [place], [place]  [address]       Ph: [phone]       Fax: [phone]   RxID:   [id number] SYMBICORT 80-4.5 MCG/ACT AERO (BUDESONIDE-FORMOTEROL FUMARATE) 1 puff bid  #1 device x 1   Entered and Authorized by:   Minus Breeding MD   Signed by:   Minus Breeding MD on 03/03/2009   Method used:   Electronically to        [facility] (retail)       [address]       [place], [place]  [address]       Ph: [phone]       Fax: [phone]   RxID:   [id number] DIABETIC SHOES 250.01  #1 pair x 0   Entered and Authorized by:   Minus Breeding MD   Signed by:   Minus Breeding MD on 03/03/2009   Method used:   Print then Give to Patient   RxID:   [id number]

## 2010-06-02 NOTE — Letter (Signed)
Summary: Remote Device Check  [facility], Main Office  [address]   [place], [place] [address]   Phone: [phone]  Fax: [phone]     May 20, 2009 MRN: [id number]   Lance [name] [address] [place], [place]  [address]   Dear Lance [name],   Your remote transmission was recieved and reviewed by your physician.  All diagnostics were within normal limits for you.    ___X___Your next office visit is scheduled for:   MARCH 2011 WITH DR Ladona Ridgel. Please call our [place] office AT [phone] to schedule an appointment.    Sincerely,  Proofreader

## 2010-06-02 NOTE — Cardiovascular Report (Signed)
Summary: Office Visit Remote  Office Visit Remote   Imported By: Roderic Ovens 11/30/2008 10:10:23  _____________________________________________________________________  External Attachment:    Type:   Image     Comment:   External Document

## 2010-06-02 NOTE — Assessment & Plan Note (Signed)
Summary: f/u appt per pt/#/cd   Vital Signs:  Patient profile:   65 year old male Height:      65 inches Weight:      240 pounds BMI:     40.08 O2 Sat:      97 % on Room air Temp:     97.3 degrees F oral Pulse rate:   84 / minute BP sitting:   152 / 86  (left arm) Cuff size:   large  Vitals Entered By: Bill Salinas CMA (January 30, 2009 9:32 AM)  O2 Flow:  Room air CC: Pt here for follow and by looking at his CBG record, CBGs running in the high 100's in the am and the mid 200's around lunch time/ pt no longer takes metoprolol, plavix, vit b12, alprazolam 0.25mg , ventolin or sulfamethoxazole/ab   Primary Provider:  luking  CC:  Pt here for follow and by looking at his CBG record, CBGs running in the high 100's in the am and the mid 200's around lunch time/ pt no longer takes metoprolol, plavix, vit b12, alprazolam 0.25mg , and ventolin or sulfamethoxazole/ab.  History of Present Illness: pt states he feels well in general, except for "sinuses."   he brings a record of his cbg's which i have reviewed today.  cbg's are improved overall.  it varies from 79 (in the afternoon when he misses his lunch) to 285 before lunch.  he says that insurance only pays for 3 vials of insulin per month, no matter what the type.    Current Medications (verified): 1)  Norvasc 5 Mg  Tabs (Amlodipine Besylate) .... Take 1 Tab By Mouth Qd 2)  Aspirin 325 Mg  Tbec (Aspirin) .... Take 1 By Mouth Qd 3)  Celexa 40 Mg  Tabs (Citalopram Hydrobromide) .... Take 1 By Mouth Qd 4)  Fish Oil 1000 Mg  Caps (Omega-3 Fatty Acids) .... Take 1 By Mouth Qd 5)  Metformin Hcl 1000 Mg  Tabs (Metformin Hcl) .... Take 1 By Mouth Two Times A Day Qd 6)  Metoprolol Succinate 100 Mg  Tb24 (Metoprolol Succinate) .... Take 1 By Mouth Qd 7)  Plavix 75 Mg  Tabs (Clopidogrel Bisulfate) .... Take 1 By Mouth Qd 8)  Vitamin B-12 Cr 1000 Mcg  Tbcr (Cyanocobalamin) .... Take 1 By Mouth Qd 9)  Alprazolam 1 Mg  Tabs (Alprazolam) .... Take 1  At Bedtime Prn 10)  Hydrocodone-Acetaminophen 7.5-325 Mg  Tabs (Hydrocodone-Acetaminophen) .... Take 1 By Mouth Q 4-6 Hours Prn 11)  Clomid 50 Mg  Tabs (Clomiphene Citrate) .... 1/4 Tab Qd 12)  Omeprazole 40 Mg Cpdr (Omeprazole) .... Take 1 By Mouth Qd 13)  Doxazosin Mesylate 4 Mg Tabs (Doxazosin Mesylate) .... Take 1 By Mouth Qd 14)  Lipitor 80 Mg Tabs (Atorvastatin Calcium) .... Take 1 By Mouth Qd 15)  Aggrenox 25-200 Mg Xr12h-Cap (Aspirin-Dipyridamole) .... Take 2 By Mouth Once Daily For 2 Weeks Then Start Back in Plavix & Asprin 16)  Alprazolam 0.25 Mg Tbdp (Alprazolam) .... Take 1 By Mouth Qd 17)  Unifine Pentips 31g X 8 Mm Misc (Insulin Pen Needle) .... Use To Administer Humalog Injection Three Times A Day 18)  Ventolin Hfa 108 (90 Base) Mcg/act Aers (Albuterol Sulfate) .... 2 Puffs Q4h As Needed 19)  Fexofenadine Hcl 180 Mg Tabs (Fexofenadine Hcl) .Marland Kitchen.. 1 Daily 20)  Fluticasone Propionate 50 Mcg/act Susp (Fluticasone Propionate) .Marland Kitchen.. 1 Daily 21)  Cialis 20 Mg Tabs (Tadalafil) .... For As Needed Use 22)  Sulfamethoxazole-Trimethoprim 400-80  Mg/1ml Soln (Sulfamethoxazole-Trimethoprim) .Marland Kitchen.. 1 Tab Two Times A Day 23)  Levemir 100 Unit/ml Soln (Insulin Detemir) .Marland KitchenMarland KitchenMarland Kitchen 190 Units Qam 24)  Tamsulosin Hcl 0.4 Mg Caps (Tamsulosin Hcl) .Marland Kitchen.. 1 Tab Qd 25)  Protonix 40 Mg Tbec (Pantoprazole Sodium) .Marland Kitchen.. 1 Tab Qhs  Allergies (verified): 1)  ! Penicillin  Past History:  Past Medical History: Last updated: 12/07/2007 Benign prostatic hypertrophy sleep study was recently negative for sleep apnea, per the patient's report. HYPOGONADISM, MALE (ICD-257.2) BENIGN PROSTATIC HYPERTROPHY (ICD-600.00) HYPERTENSION (ICD-401.9) GERD (ICD-530.81) DIABETES MELLITUS, TYPE II (ICD-250.00) DEPRESSION (ICD-311) CORONARY ARTERY DISEASE (ICD-414.00) ALLERGIC RHINITIS (ICD-477.9)  Review of Systems  The patient denies hypoglycemia.    Physical Exam  General:  obese.  no distress  Psych:  Alert and  cooperative; normal mood and affect; normal attention span and concentration.     Impression & Recommendations:  Problem # 1:  DIABETES MELLITUS, TYPE II (ICD-250.00) this is the best control this pt should aim for, given this regimen, which does a poor job of matching insulin to his changing needs throughout the day, and by pt's request for least expensive meds  Medications Added to Medication List This Visit: 1)  Tamsulosin Hcl 0.4 Mg Caps (Tamsulosin hcl) .Marland Kitchen.. 1 tab qd 2)  Protonix 40 Mg Tbec (Pantoprazole sodium) .Marland Kitchen.. 1 tab qhs 3)  Relion 70/30 70-30 % Susp (Insulin isophane & regular) .Marland Kitchen.. 160 units am and 20 units pm  Other Orders: Flu Vaccine 54yrs + ([address]) Administration Flu vaccine - MCR (G0008) Est. Patient Level III ([address])  Patient Instructions: 1)  change levemir to reli-on 70/30, 160 units am and 20 units pm. 2)  on this type of insulin schedule, you must not miss meals. 3)  Please schedule a follow-up appointment in 1 month. Prescriptions: RELION 70/30 70-30 % SUSP (INSULIN ISOPHANE & REGULAR) 160 units am and 20 units pm  #7 vials x 3   Entered and Authorized by:   Minus Breeding MD   Signed by:   Minus Breeding MD on 01/30/2009   Method used:   Electronically to        [facility] (retail)       [address]       [place], [place]  [address]       Ph: [phone]       Fax: [phone]   RxID:   [id number]   Flu Vaccine Consent Questions     Do you have a history of severe allergic reactions to this vaccine? no    Any prior history of allergic reactions to egg and/or gelatin? no    Do you have a sensitivity to the preservative Thimersol? no    Do you have a past history of Guillan-Barre Syndrome? no    Do you currently have an acute febrile illness? no    Have you ever had a severe reaction to latex? no    Vaccine information given and explained to patient? yes    Are you currently pregnant? no    Lot IEPPIR:[id number]    Exp Date:07/31/2009   Site Given  Left Deltoid IMedflu

## 2010-06-02 NOTE — Progress Notes (Signed)
Summary: insuline   Phone Note Call from Patient Call back at Home Phone [phone]   Caller: [phone] Call For: dr Everardo All Summary of Call: pt states he wants two bottles of the humalog instead of one . because he is running out faster, should he wait until his 6wks to disuess this with dr Everardo All . pt was here feb 2,08 Initial call taken by: Shelbie Proctor,  June 22, 2007 8:57 AM  Follow-up for Phone Call        done Follow-up by: Minus Breeding MD,  June 22, 2007 1:14 PM  Additional Follow-up for Phone Call Additional follow up Details #1::        Phone Call Completed, Rx Called In, Provider Notified called pt to inform left msg on machine June 22, 2007 1:24 PM  Additional Follow-up by: Shelbie Proctor,  June 22, 2007 1:25 PM      Prescriptions: HUMALOG 100 UNIT/ML  SOLN (INSULIN LISPRO (HUMAN)) see office notes  #2 x 11   Entered and Authorized by:   Minus Breeding MD   Signed by:   Minus Breeding MD on 06/22/2007   Method used:   Electronically sent to ...       [facility]*       [phone]       [place], [place]  [address]       Ph: [phone]       Fax: [phone]   RxID:   [id number]

## 2010-06-02 NOTE — Assessment & Plan Note (Signed)
Summary: 6 WK FU-STC   Vital Signs:  Patient Profile:   65 Years Old Male Weight:      242.8 pounds Temp:     98.3 degrees F oral Pulse rate:   88 / minute BP sitting:   146 / 86  (right arm) Cuff size:   large  Vitals Entered By: Orlan Leavens (June 13, 2007 9:52 AM)                 Visit Type:  Follow-up Visit PCP:  luking  Chief Complaint:  dm.  History of Present Illness: now takes humalog three times a day (qac) 20-5-35, and lantus 50 at bedtime.  he was taking a higher amount of humalog at lunch, but this caused afternoon hypoglycemia.  says his glucose is highest in am due to hs   he brings an extensive record of his cbg's, which i have reviewed todaqy.  Current Allergies: ! PENICILLIN  Past Medical History:    Reviewed history from 03/06/2007 and no changes required:       Allergic rhinitis       Coronary artery disease       Depression       Diabetes mellitus, type II       GERD       Hypertension       Benign prostatic hypertrophy       sleep study was recently negative for sleep apnea, per the patient's report.     Review of Systems  The patient denies syncope.     Physical Exam  General:     obese.   Psych:     alert and cooperative; normal mood and affect; normal attention span and concentration    Impression & Recommendations:  Problem # 1:  DIABETES MELLITUS, TYPE II (ICD-250.00) Assessment: Improved  His updated medication list for this problem includes:    Humalog 100 Unit/ml Soln (Insulin lispro (human)) ..... See office notes    Lantus 100 Unit/ml Soln (Insulin glargine) ..... See office notes    Metformin Hcl 1000 Mg Tabs (Metformin hcl) .Marland Kitchen... Take 1 by mouth two times a day qd  Orders: Est. Patient Level III ([address])    Patient Instructions: 1)  increase qac humalog to 20-5-35-5 2)  same lantus (50-qhs) 3)  ret 6 weeks    ]

## 2010-06-02 NOTE — Consult Note (Signed)
Summary: [facility]   Imported By: Roderic Ovens 04/24/2009 14:56:00  _____________________________________________________________________  External Attachment:    Type:   Image     Comment:   External Document

## 2010-06-02 NOTE — Progress Notes (Signed)
Summary: Insulin ?  Phone Note Call from Patient   Caller: Patient Summary of Call: pt called stating that Levemir 110u qam has not been controlling blood sugar. As of this morning it has been higher than 400. Pt states that he has been taking 140u in the am with no results. Please advise Initial call taken by: Margaret Pyle, CMA,  January 02, 2009 9:41 AM  Follow-up for Phone Call        increase to 170 units each am return 01/08/09, 11:45 am Follow-up by: Minus Breeding MD,  January 02, 2009 12:41 PM  Additional Follow-up for Phone Call Additional follow up Details #1::        left message on machine for pt to return my call  Additional Follow-up by: Margaret Pyle, CMA,  January 02, 2009 2:02 PM    Additional Follow-up for Phone Call Additional follow up Details #2::    left message on machine for pt to return my call  Follow-up by: Margaret Pyle, CMA,  January 03, 2009 8:20 AM  Additional Follow-up for Phone Call Additional follow up Details #3:: Details for Additional Follow-up Action Taken: pt aware of increase, Please schedule pt for 01-08-2009 at 11:45 as indicated if possible, and notify                    Gave pt appt:  01/07/09 @ 3P w/Dr Allie Dimmer Additional Follow-up by: Margaret Pyle, CMA,  January 03, 2009 11:16 AM  New/Updated Medications: LEVEMIR 100 UNIT/ML SOLN (INSULIN DETEMIR) 170 units qam

## 2010-06-02 NOTE — Letter (Signed)
Summary: Remote Device Check  [employer], Main Office  [address]   [place], [place] [address]   Phone: [phone]  Fax: [phone]     November 20, 2008 MRN: [id number]   Lance [name] [address] [place], [place]  [address]   Dear Lance [name],   Your remote transmission was recieved and reviewed by your physician.  All diagnostics were within normal limits for you.  ___X__Your next transmission is scheduled for:   February 17, 2009.  Please transmit at any time this day.  If you have a wireless device your transmission will be sent automatically.      Sincerely,  Proofreader

## 2010-06-02 NOTE — Miscellaneous (Signed)
Summary: Device preload  Clinical Lists Changes  Observations: Added new observation of ICDEXPLCOMM: Merlin (06/28/2008 12:59) Added new observation of ICD INDICATN: Cardiac Arrest (06/28/2008 12:59) Added new observation of ICDLEADSTAT2: active (06/28/2008 12:59) Added new observation of ICDLEADSER2: [id number] (06/28/2008 12:59) Added new observation of ICDLEADMOD2: 1580  (06/28/2008 12:59) Added new observation of ICDLEADDOI2: 11/06/2003  (06/28/2008 12:59) Added new observation of ICDLEADLOC2: RV  (06/28/2008 12:59) Added new observation of ICDLEADSTAT1: active  (06/28/2008 12:59) Added new observation of ICDLEADSER1: [id number]  (06/28/2008 12:59) Added new observation of ICDLEADMOD1: 1488TC  (06/28/2008 12:59) Added new observation of ICDLEADDOI1: 11/06/2003  (06/28/2008 12:59) Added new observation of ICDLEADLOC1: RA  (06/28/2008 12:59) Added new observation of ICD IMPL DTE: 11/06/2003  (06/28/2008 12:59) Added new observation of ICD SERL#: [id number]  (06/28/2008 12:59) Added new observation of ICD MODL#: V234  (06/28/2008 12:59) Added new observation of ICDMANUFACTR: [name]  (06/28/2008 12:59) Added new observation of CARDIO MD: Lewayne Bunting, MD  (06/28/2008 12:59)      PPM Specifications Following MD:  Lewayne Bunting, MD       ICD Specifications Following MD: Lewayne Bunting, MD      ICD Vendor: [facility] [name]     ICD Model Number: V234     ICD Serial Number: [id number]  ICD DOI: 11/06/2003      Lead 1:    Location: RA     DOI: 11/06/2003     Model #: 1488TC     Serial #: [id number]     Status: active Lead 2:    Location: RV     DOI: 11/06/2003     Model #: 1580     Serial #: [id number]     Status: active  Indications::  Cardiac Arrest  Explantation Comments: Lance [name]

## 2010-06-02 NOTE — Letter (Addendum)
Summary: Patient Notice, Endo Biopsy Results  [facility]  [address]   [place], [place] [address]   Phone: [phone]  Fax: [phone]       April 10, 2010   Lance [name] [address] [place], [place]  [address] 22-Sep-1945    Dear [name],  I am pleased to inform you that the biopsies taken during your recent endoscopic examination did not show any evidence of cancer upon pathologic examination.  Additional information/recommendations:  Please call us if you are having persistent problems or have questions about your condition that have not been fully answered at this time.  Sincerely,    R. Roetta Sessions MD, FACP [facility] Ph: [phone]   Fax: [phone]   Appended Document: Patient Notice, Endo Biopsy Results letter mailed to pt

## 2010-06-02 NOTE — Cardiovascular Report (Signed)
Summary: Office Visit Remote   Office Visit Remote   Imported By: Roderic Ovens 11/12/2009 12:03:19  _____________________________________________________________________  External Attachment:    Type:   Image     Comment:   External Document

## 2010-06-02 NOTE — Letter (Signed)
Summary: [place] Results Engineer, agricultural at [facility]  [address] [place] [address]   Phone: [phone]  Fax: [phone]      May 19, 2009 MRN: [id number]   Lance [name] [address] [place], [place]  [address]   Dear Mr. GERTSCH,  Your test ordered by Selena Batten has been reviewed by your physician (or physician assistant) and was found to be normal or stable. Your physician (or physician assistant) felt no changes were needed at this time.  ____ Echocardiogram  ____ Cardiac Stress Test  ____ Lab Work  ____ Peripheral vascular study of arms, legs or neck  ____ CT scan or X-ray  ____ Lung or Breathing test  __x__ Other: elevated blood pressure readings  Dr. Dietrich Pates would like to see better blood pressure  control, so he would like to add lisinopril 5mg    daily to your medication regimen.  I have sent a prescription in to [facility] per our   records.Please call us if you have any questions   Thank you, Guyla Bless RN    [place] Bing, MD, Lenise Arena.C.Gaylord Shih, MD, F.A.C.C Lewayne Bunting, MD, F.A.C.C Nona Dell, MD, F.A.C.C Charlton Haws, MD, Lenise Arena.C.C

## 2010-06-02 NOTE — Letter (Signed)
Summary: [place] [facility] Work Engineer, agricultural at [employer]  [address] [place] [address]   Phone: [phone]  Fax: [phone]     April 30, 2009 MRN: [id number]   Lance [name] [address] [place], [place]  [address]      YOUR LAB WORK IS DUE  The week of  May 05, 2009 _________________________________________  Please go to [facility], located across the street from [facility] on the second floor.  Hours are Monday - Friday 7am until 7:30pm         Saturday 8am until 12noon    _X_  DO NOT EAT OR DRINK AFTER MIDNIGHT EVENING PRIOR TO LABWORK  __ YOUR LABWORK IS NOT FASTING --YOU MAY EAT PRIOR TO LABWORK

## 2010-06-03 ENCOUNTER — Encounter: Payer: Self-pay | Admitting: Cardiology

## 2010-06-03 ENCOUNTER — Institutional Professional Consult (permissible substitution) (INDEPENDENT_AMBULATORY_CARE_PROVIDER_SITE_OTHER): Payer: Medicare Other | Admitting: Cardiology

## 2010-06-03 DIAGNOSIS — R55 Syncope and collapse: Secondary | ICD-10-CM

## 2010-06-03 DIAGNOSIS — I10 Essential (primary) hypertension: Secondary | ICD-10-CM

## 2010-06-04 NOTE — Miscellaneous (Signed)
Summary: CONSULTATION  Clinical Lists Changes  NAME:  Lance [name], Lance [name]                ACCOUNT NO.:  [id number]      MEDICAL RECORD NO.:  [id number]          PATIENT TYPE:  OBV      LOCATION:  A311                          FACILITY:  [facility]      PHYSICIAN:  Jonette Eva, M.D.     DATE OF BIRTH:  1945/07/16      DATE OF CONSULTATION:  04/06/2010   DATE OF DISCHARGE:                                    CONSULTATION         PRIMARY CARE PHYSICIAN:  Scott A. Gerda Diss, MD.      PRIMARY CARDIOLOGIST:  Dr. [name], MD      REASON FOR CONSULTATION:  Reflux.      HISTORY OF PRESENT ILLNESS:  Lance [name] is a 65 year old male who has a   longstanding history of gastroesophageal reflux disease.  He was last   managed by Dr. Russella Dar. He has not seen Dr. Russella Dar for many years.  He   reports being on purple pill to manage his reflux but his insurance   company would not pay for it.  He has been off of the purple pill for   approximately 2 years.  He has been given different medication regimens   by Dr. Lilyan Punt.  He does not know which proton pump inhibitor he is   taking.  He reports that he was written for one and then because of a   potential interaction with his Plavix it was discontinued.  He states   his wife knows.  His wife says that the list in the computer and he is   listed on Protonix and omeprazole.  He reports having symptoms for   reflux 2 to 3 times per day.  It is a burning sensation in his chest as   well as his epigastrium.  He uses Rolaids, Mild of Magnesia and Maalox   as needed.  He also sometimes feels a liquid bubble up in the back of   his throat.  He weighed 242 pounds in  December 2010 and on admission   here he weighs 227 pounds.  He rarely  smokes cigarettes.  He chews   tobacco daily.  He has nausea 2 to 3 times a week.  He vomits  once a   week.  Denies any blood in his vomit.  He rarely has diarrhea.  He does   not have constipation.  He reports seeing black  stool 3 to 4 weeks ago.   He is chronically maintained on aspirin and Plavix.  His last coronary   intervention was in 2005.  He denies using BC powders, Goodie powders,   ibuprofen or Motrin.  He reports having a colonoscopy a year ago in   [place], possibly by  GI.  He does not identify any   precipitating events for this episode of reflux.  He was working with   his grandson and developed severe nausea. He came home and went to bed   and an hour later his symptoms  were worse and so he came to the   emergency department.  He  rarely has problems swallowing.      PAST MEDICAL HISTORY:   1. Diabetes.   2. Coronary artery disease.   3. Hypertension.   4. Non-Q-wave MI in 2003.   5. Percutaneous coronary intervention 2004-2005.   6. Hyperlipidemia.   7. 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      PAST SURGICAL HISTORY:   1. Neck surgery x2.   2. Back surgery.   3. Left knee surgery.   4. Ankle surgery.      ALLERGIES:  Penicillin.      MEDICATIONS:   1. Xanax 1 mg nightly as needed.   2. Norvasc.   3. Clomid.   4. Plavix 75 mg daily.   5. Aspirin daily.   6. Lovenox daily.   7. NovoLog 70/30 75 units three times a day.   8. Prinivil.   9. Glucophage.   10.Protonix once daily.   11.Crestor.   12.Flomax.   13.Percocet 1.5 tablets as needed.      FAMILY HISTORY:  He denies any family history of colon cancer, colon   polyps.      SOCIAL HISTORY:  His wife works for Dr. Lilyan Punt.  He is retired and   was a Curator.      REVIEW OF SYSTEMS:  Per the HPI otherwise all systems are negative. The   patient presented to the emergency department with chief complaint of   syncope.      PHYSICAL EXAMINATION:  VITAL SIGNS:  T max 98.1, hemodynamically stable.   GENERAL:  He is in no apparent distress, alert and oriented x4.  His   orthostatics were negative.   VITAL SIGNS:  Blood pressure 127/69  lying with heart rate of 67, blood   pressure 126/71 with heart rate of 94 standing.   GENERAL:  He is in no apparent distress, alert and oriented x4.   HEENT:  Exam is atraumatic, normocephalic.  Pupils are equal, react to   light.  Mouth no oral lesions.   NECK:  Full range of motion.  No lymphadenopathy.   LUNGS:  Clear to auscultation bilaterally.   CARDIOVASCULAR: Regular rhythm, unable to appreciate a murmur.   ABDOMEN:  Bowel sounds are present, soft, obese, mild tenderness to   palpation in the epigastrium without rebound or guarding.   EXTREMITIES:  No cyanosis or edema.   NEUROLOGICAL:  No focal neurologic deficit.      LABORATORY DATA:  Hemoglobin A1c in September 2011 10.1 (since 2008 his   hemoglobin A1c has been greater than 7),  TSH in 2008 of 1.95,   hemoglobin 2010/04/611.3 with MCV of 87.4 and platelet count of   118, no iron profile in the computer.      Labs from December 2011 include a hemoglobin 13 and 13.9, white count   6.7, platelet count 121 to 131, INR 0.98 and cardiac enzymes negative   x3.  Lipase 26, amylase 47, albumin 3.4, normal total bilirubin, alk   phosphorus, AST and ALT.  Creatinine 0.88.      RADIOGRAPHY:  Chest x-ray showed no acute cardiopulmonary disease.  CT   scan of the head shows stable exam.      ASSESSMENT:  Lance [name] is a 65 year old male with a longstanding history   of reflux  and multiple lifestyle risk  factors that exacerbate his   reflux.  These include continued  tobacco use, obesity, and likely   nonadherence to a low-fat diet.      If he is having symptoms daily he needs to be on a twice a day PPI.  The   benefits of taking the  Protonix outweigh the risks of in-stent stenosis   at this point.      Thank you for allowing me to see Lance [name] in consultation.  My   recommendations follow.      RECOMMENDATIONS:   1.  Twice a day PPI.   2. Low-fat diet.   3. Will hold his insulin 70/30 on April 07, 2010 and start  insulin       sliding scale.   4. Plan for EGD on tomorrow to evaluate new-onset dyspepsia.   5. If his reflux is still not well-controlled then he will need a       gastric emptying study to see if gastroparesis is playing a role in       worsening his symptoms. If he does not have evidence of       gastroparesis and is able to comply with avoiding nicotine and fat       and taking his Medication, then would consider him for Nissen fundoplication as long he       does not have evidence of delayed gastric emptying.                     Jonette Eva, M.D.               SF/MEDQ  D:  04/06/2010  T:  04/06/2010  Job:  [id number]      cc:   [name], MD, [facility]   [address]   [place], [place] [address]      Scott A. Gerda Diss, MD   Fax: [phone]      Electronically Signed by Jonette Eva M.D. on 05/14/2010 78:29:56 PM

## 2010-06-04 NOTE — Consult Note (Signed)
Summary: GI-Dr. Darrick Penna  [facility]   Imported By: Marylou Mccoy 04/10/2010 16:21:37  _____________________________________________________________________  External Attachment:    Type:   Image     Comment:   External Document

## 2010-06-04 NOTE — Letter (Signed)
Summary: Plan of Care, Need to Discuss  [facility]  [address]   [place], [place] [address]   Phone: [phone]  Fax: [phone]    April 13, 2010  Lance [name] [address] [place], [place]  [address] 1945-07-18   Dear [name],   We are writing this letter to inform you of treatment plans and/or discuss your plan of care.  We have tried several times to contact you; however, we have yet to reach you.  We ask that you please contact our office for follow-up on your gastrointestinal issues.  We can  be reached at [phone] to schedule an appointment, or to speak with someone regarding your health care needs.  Please do not neglect your health.   Sincerely,    Cloria Spring LPN  [facility] Ph: [phone]    Fax: [phone]

## 2010-06-04 NOTE — Progress Notes (Signed)
Summary: patient wanting to switch physicians   Phone Note Call from Patient   Caller: Patient Reason for Call: Talk to Nurse Summary of Call: patient wants to switch to Dr. Daleen Squibb per pt request / please ask Dr.Raeli Wiens if this is okay / please let me know if this is okay so I can contact patient for appointment / tg Initial call taken by: Raechel Ache [facility],  April 21, 2010 1:29 PM  Follow-up for Phone Call        OK with me.   Follow-up by: Kathlen Brunswick, MD, [facility],  April 26, 2010 4:16 PM

## 2010-06-05 ENCOUNTER — Encounter (INDEPENDENT_AMBULATORY_CARE_PROVIDER_SITE_OTHER): Payer: Medicare Other

## 2010-06-05 ENCOUNTER — Encounter: Payer: Self-pay | Admitting: Cardiology

## 2010-06-05 DIAGNOSIS — I10 Essential (primary) hypertension: Secondary | ICD-10-CM

## 2010-06-09 ENCOUNTER — Telehealth: Payer: Self-pay | Admitting: Cardiology

## 2010-06-10 NOTE — Progress Notes (Signed)
Summary: pt requesting to move to dr Graciela Husbands from dr taylor   Phone Note Call from Patient   Caller: Patient [phone] Reason for Call: Talk to Nurse Summary of Call: pt requesting to change from dr taylor to dr Graciela Husbands Initial call taken by: Glynda Jaeger,  May 07, 2010 4:05 PM  Follow-up for Phone Call        spoke with Mr Hobbs  Few things going on BP dropping where he passes out breaks out in sweat /can't breath? 3weeks ago went out completly  will have him send transmisson and see if it shows anything No episodes pt aware I will discuss with Dr Ladona Ridgel next week Dennis Bast, RN, BSN  May 07, 2010 5:24 PM   Additional Follow-up for Phone Call Additional follow up Details #1::        follow up in [facility], RN, BSN  May 12, 2010 5:36 PM ok per Dr Ladona Ridgel Dennis Bast, RN, BSN  June 02, 2010 10:36 AM
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
## 2010-06-14 ENCOUNTER — Encounter (INDEPENDENT_AMBULATORY_CARE_PROVIDER_SITE_OTHER): Payer: Self-pay | Admitting: *Deleted

## 2010-06-18 NOTE — Progress Notes (Signed)
Summary: pt rtn call  Medications Added METOPROLOL TARTRATE 50 MG TABS (METOPROLOL TARTRATE) Take one tablet by mouth twice a day       Phone Note Call from Patient Call back at [phone]   Caller: Patient Reason for Call: Talk to Nurse, Talk to Doctor Summary of Call: pt rtn call Initial call taken by: Omer Jack,  June 09, 2010 4:02 PM  Follow-up for Phone Call        Pt aware of ambulatory bp results. He will increase Metoprolol to 50mg  two times a day and keep a bp log daily. His next appt with Dr. Daleen Squibb is 07/01/10 Follow-up by: Lisabeth Devoid RN,  June 09, 2010 4:12 PM    New/Updated Medications: METOPROLOL TARTRATE 50 MG TABS (METOPROLOL TARTRATE) Take one tablet by mouth twice a day Prescriptions: METOPROLOL TARTRATE 50 MG TABS (METOPROLOL TARTRATE) Take one tablet by mouth twice a day  #60 x 6   Entered by:   Lisabeth Devoid RN   Authorized by:   Gaylord Shih, MD, [facility]   Signed by:   Lisabeth Devoid RN on 06/09/2010   Method used:   Electronically to        [facility] [address] [phone]* (retail)       [address], [place]  [address]       Ph: [phone]       Fax: [phone]   RxID:   [id number]

## 2010-06-18 NOTE — Procedures (Signed)
Summary: blood pressure report  blood pressure report   Imported By: Mirna Mires 06/10/2010 10:11:47  _____________________________________________________________________  External Attachment:    Type:   Image     Comment:   External Document

## 2010-06-22 ENCOUNTER — Ambulatory Visit: Payer: Medicare Other | Admitting: Endocrinology

## 2010-06-23 ENCOUNTER — Encounter: Payer: Self-pay | Admitting: Endocrinology

## 2010-06-23 ENCOUNTER — Ambulatory Visit (INDEPENDENT_AMBULATORY_CARE_PROVIDER_SITE_OTHER): Payer: Medicare Other | Admitting: Endocrinology

## 2010-06-23 DIAGNOSIS — E119 Type 2 diabetes mellitus without complications: Secondary | ICD-10-CM

## 2010-06-24 ENCOUNTER — Encounter (INDEPENDENT_AMBULATORY_CARE_PROVIDER_SITE_OTHER): Payer: Self-pay | Admitting: *Deleted

## 2010-06-24 NOTE — Cardiovascular Report (Signed)
Summary: Office Visit Remote   Office Visit Remote   Imported By: Roderic Ovens 06/16/2010 14:56:30  _____________________________________________________________________  External Attachment:    Type:   Image     Comment:   External Document

## 2010-06-24 NOTE — Letter (Signed)
Summary: Remote Device Check  [facility], Main Office  [address]   [place], [place] [address]   Phone: [phone]  Fax: [phone]     June 14, 2010 MRN: [id number]   BRANDOM KERWIN [address] [place], [place]  [address]   Dear Mr. GANGEMI,   Your remote transmission was recieved and reviewed by your physician.  All diagnostics were within normal limits for you.  __X____Your next office visit is scheduled for: March 2012 with Dr Ladona Ridgel. Please call our office to schedule an appointment.    Sincerely,  Vella Kohler

## 2010-06-26 ENCOUNTER — Telehealth: Payer: Self-pay | Admitting: Endocrinology

## 2010-06-30 NOTE — Progress Notes (Signed)
Summary: CBG 51  Phone Note Call from Patient Call back at Work Phone [phone]   Caller: Spouse Summary of Call: Spouse called stating since Insulin was changed pt's CBG is 51 this morning and pt is sweating and feeling very tired. Please advise. Initial call taken by: Margaret Pyle, CMA,  June 26, 2010 10:16 AM  Follow-up for Phone Call        decrease insulin to 100 units each am.  please verify pt is not taking any insulin other than with breakfast. Follow-up by: Minus Breeding MD,  June 26, 2010 10:18 AM  Additional Follow-up for Phone Call Additional follow up Details #1::        Pt spouse verified pt is only taking Humalin 70/30. Advised of decreased units, sposuse understood. Additional Follow-up by: Margaret Pyle, CMA,  June 26, 2010 11:30 AM    New/Updated Medications: HUMULIN 70/30 70-30 % SUSP (INSULIN ISOPHANE & REGULAR) 100 units with the first meal of the day, and 75 units with the last meal of the day

## 2010-06-30 NOTE — Assessment & Plan Note (Signed)
Summary: FOLLOW UP/NWS   Vital Signs:  Patient profile:   65 year old male Height:      69 inches (175.26 cm) Weight:      238.06 pounds (108.21 kg) BMI:     35.28 O2 Sat:      96 % on Room air Temp:     98.8 degrees F (37.11 degrees C) oral Pulse rate:   97 / minute Pulse rhythm:   regular BP sitting:   144 / 82  (left arm) Cuff size:   large  Vitals Entered By: Brenton Grills CMA Duncan Dull) (June 23, 2010 4:12 PM)  O2 Flow:  Room air CC: Follow up on DM/aj Is Patient Diabetic? Yes Comments Pt is no longer taking Plavix   Primary Provider:  Dr.Scott Luking  CC:  Follow up on DM/aj.  History of Present Illness: pt was rx'ed with steriods 3 weeks ago, for contact dermatitis.  his cbg went up to approx 600.  it has since returned to 100's.  he has also changed his 70/30 insulin to 75 units three times a day (just before each meal).  he said this was due to hypoglycemia before lunch.  he says 50 units with evening meal causes hyperglycemia at hs, and in am.   pt says dr Gerda Diss recently checked a1c.  Current Medications (verified): 1)  Aspir-Low 81 Mg Tbec (Aspirin) .... Take 1 Tab Daily 2)  Fish Oil 1000 Mg  Caps (Omega-3 Fatty Acids) .... Take 1 By Mouth Qd 3)  Metformin Hcl 1000 Mg  Tabs (Metformin Hcl) .... Take 1 By Mouth Two Times A Day Qd 4)  Alprazolam 1 Mg  Tabs (Alprazolam) .... Take 1 At Bedtime Prn 5)  Hydrocodone-Acetaminophen 7.5-325 Mg  Tabs (Hydrocodone-Acetaminophen) .... Take As Needed 6)  Omeprazole 20 Mg Cpdr (Omeprazole) .... Take 1 Tab 2 Times Daily 7)  Lipitor 80 Mg Tabs (Atorvastatin Calcium) .... Take 1 By Mouth Qd 8)  Tamsulosin Hcl 0.4 Mg Caps (Tamsulosin Hcl) .Marland Kitchen.. 1 Tab Qd 9)  Lisinopril 10 Mg Tabs (Lisinopril) .... Take One Tablet By Mouth Daily 10)  Humulin 70/30 70-30 % Susp (Insulin Isophane & Regular) .Marland KitchenMarland KitchenMarland Kitchen 140 Units Am and 50 Units Pm 11)  Nitrostat 0.4 Mg Subl (Nitroglycerin) .... Take As Directed For Chest Pain 12)  Plavix 75 Mg Tabs  (Clopidogrel Bisulfate) .... Take One Tablet By Mouth Daily 13)  Nexium 40 Mg Cpdr (Esomeprazole Magnesium) .... At Breakfast 14)  Metoprolol Tartrate 50 Mg Tabs (Metoprolol Tartrate) .... Take 2 Tablets By Mouth Once Daily 15)  Allegra Allergy 180 Mg Tabs (Fexofenadine Hcl) .Marland Kitchen.. 1 Tablet By Mouth As Needed For Allergies 16)  Aggrenox 25-200 Mg Xr12h-Cap (Aspirin-Dipyridamole) .Marland Kitchen.. 1 Tablet By Mouth Once Daily 17)  Diazepam 5 Mg Tabs (Diazepam) .Marland Kitchen.. 1 Tablet By Mouth Once Daily  Allergies (verified): 1)  ! Penicillin  Past History:  Past Medical History: Last updated: 03/26/2009 ASCVD: 70% mid left anterior descending lesion on cath in 06/1995; left anterior descending DES placed in 8/03 and RCA stent in 9/03; captain 3/05 revealed 90% second marginal for which PCI was performed, 70% PDA and total obstruction of the first diagonal and marginal; sudden cardiac death [place] in 10/2003 for which automatic implantable cardiac defibrillator placed; negative stress nuclear study in 01/2006 with ejection fraction of 52% ________________________________________ Hypertension Hyperlipidemia Tobacco abuse: 100-pack-year consumption; cigarettes discontinued 2003; all tobacco products in 2008 Cerebrovascular disease: Transient ischemic attack in 05/2008; carotid ultrasound-plaque without focal disease  AODM-insulin requirement Degenerative joint disease-C-spine fusion in 2002 Erectile dysfunction Anxiety and depression Benign prostatic hypertrophy sleep study was recently negative for sleep apnea, per the patient's report. HYPOGONADISM, MALE (ICD-257.2) BENIGN PROSTATIC HYPERTROPHY (ICD-600.00) GERD (ICD-530.81) DIABETES MELLITUS, TYPE II (ICD-250.00) DEPRESSION (ICD-311) CORONARY ARTERY DISEASE (ICD-414.00) ALLERGIC RHINITIS (ICD-477.9)  Review of Systems       no hypoglycemia since on the current insulin.    Physical Exam  General:  obese.  no distress  Pulses:   dorsalis pedis intact bilat.   Extremities:  no deformity.  no ulcer on the feet.  feet are of normal color and temp.  no edema there is a healed abrasion at the right anterior tibial area. mycotic toenails.   Neurologic:  sensation is intact to touch on the feet.    Impression & Recommendations:  Problem # 1:  DIABETES MELLITUS, TYPE II (ICD-250.00) this is not a good regimen for him, as it mixes as many as 3 different insulin doses at a time.  Medications Added to Medication List This Visit: 1)  Humulin 70/30 70-30 % Susp (Insulin isophane & regular) .Marland Kitchen.. 120 units with the first meal of the day, and 75 units with the last meal of the day 2)  Metoprolol Tartrate 50 Mg Tabs (Metoprolol tartrate) .... Take 2 tablets by mouth once daily 3)  Allegra Allergy 180 Mg Tabs (Fexofenadine hcl) .Marland Kitchen.. 1 tablet by mouth as needed for allergies 4)  Aggrenox 25-200 Mg Xr12h-cap (Aspirin-dipyridamole) .Marland Kitchen.. 1 tablet by mouth once daily 5)  Diazepam 5 Mg Tabs (Diazepam) .Marland Kitchen.. 1 tablet by mouth once daily  Other Orders: Est. Patient Level III ([address])  Patient Instructions: 1)  please change humulin 70/30, to 120 units with breakfast and 75 units before the evening meal.  2)  Please schedule a follow-up appointment in 6 weeks. 3)  check your blood sugar 2 times a day.  vary the time of day when you check, between before the 3 meals, and at bedtime.  also check if you have symptoms of your blood sugar being too high or too low.  please keep a record of the readings and bring it to your next appointment here.  please call us sooner if you are having low blood sugar episodes--very important.   Orders Added: 1)  Est. Patient Level III [[address]]

## 2010-06-30 NOTE — Letter (Signed)
Summary: Recall Office Visit  [facility]  [address]   [place], [place] [address]   Phone: [phone]  Fax: [phone]      June 24, 2010   [address] [place], [place]  [address] 1946-03-07   Dear Mr. SHANKEL,   According to our records, it is time for you to schedule a follow-up office visit with [place].   At your convenience, please call [phone] to schedule an office visit. If you have any questions, concerns, or feel that this letter is in error, we would appreciate your call.   Sincerely,    Diana Eves  [facility] Ph: [phone]   Fax: [phone]

## 2010-06-30 NOTE — Letter (Signed)
Summary: [facility]  [facility]   Imported By: Marylou Mccoy 06/22/2010 10:35:01  _____________________________________________________________________  External Attachment:    Type:   Image     Comment:   External Document

## 2010-07-01 ENCOUNTER — Ambulatory Visit (INDEPENDENT_AMBULATORY_CARE_PROVIDER_SITE_OTHER): Payer: Medicare Other | Admitting: Cardiology

## 2010-07-01 ENCOUNTER — Encounter: Payer: Self-pay | Admitting: Cardiology

## 2010-07-01 DIAGNOSIS — I10 Essential (primary) hypertension: Secondary | ICD-10-CM

## 2010-07-06 ENCOUNTER — Telehealth (INDEPENDENT_AMBULATORY_CARE_PROVIDER_SITE_OTHER): Payer: Self-pay | Admitting: *Deleted

## 2010-07-09 ENCOUNTER — Telehealth: Payer: Self-pay | Admitting: Cardiology
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
## 2010-07-13 LAB — DIFFERENTIAL
Basophils Absolute: 0 10*3/uL (ref 0.0–0.1)
Basophils Absolute: 0 10*3/uL (ref 0.0–0.1)
Basophils Absolute: 0 10*3/uL (ref 0.0–0.1)
Basophils Absolute: 0 10*3/uL (ref 0.0–0.1)
Basophils Relative: 0 % (ref 0–1)
Basophils Relative: 0 % (ref 0–1)
Basophils Relative: 0 % (ref 0–1)
Basophils Relative: 1 % (ref 0–1)
Eosinophils Absolute: 0 10*3/uL (ref 0.0–0.7)
Eosinophils Absolute: 0.1 10*3/uL (ref 0.0–0.7)
Eosinophils Absolute: 0.1 10*3/uL (ref 0.0–0.7)
Eosinophils Absolute: 0.1 10*3/uL (ref 0.0–0.7)
Eosinophils Relative: 1 % (ref 0–5)
Eosinophils Relative: 2 % (ref 0–5)
Eosinophils Relative: 2 % (ref 0–5)
Eosinophils Relative: 2 % (ref 0–5)
Lymphocytes Relative: 24 % (ref 12–46)
Lymphocytes Relative: 36 % (ref 12–46)
Lymphocytes Relative: 43 % (ref 12–46)
Lymphocytes Relative: 53 % — ABNORMAL HIGH (ref 12–46)
Lymphs Abs: 1.6 10*3/uL (ref 0.7–4.0)
Lymphs Abs: 2.1 10*3/uL (ref 0.7–4.0)
Lymphs Abs: 2.4 10*3/uL (ref 0.7–4.0)
Lymphs Abs: 3.1 10*3/uL (ref 0.7–4.0)
Monocytes Absolute: 0.3 10*3/uL (ref 0.1–1.0)
Monocytes Absolute: 0.5 10*3/uL (ref 0.1–1.0)
Monocytes Absolute: 0.5 10*3/uL (ref 0.1–1.0)
Monocytes Absolute: 0.7 10*3/uL (ref 0.1–1.0)
Monocytes Relative: 10 % (ref 3–12)
Monocytes Relative: 7 % (ref 3–12)
Monocytes Relative: 8 % (ref 3–12)
Monocytes Relative: 8 % (ref 3–12)
Neutro Abs: 2.1 10*3/uL (ref 1.7–7.7)
Neutro Abs: 2.3 10*3/uL (ref 1.7–7.7)
Neutro Abs: 3.5 10*3/uL (ref 1.7–7.7)
Neutro Abs: 4.5 10*3/uL (ref 1.7–7.7)
Neutrophils Relative %: 36 % — ABNORMAL LOW (ref 43–77)
Neutrophils Relative %: 47 % (ref 43–77)
Neutrophils Relative %: 52 % (ref 43–77)
Neutrophils Relative %: 67 % (ref 43–77)

## 2010-07-13 LAB — CBC
HCT: 35.8 % — ABNORMAL LOW (ref 39.0–52.0)
HCT: 36.7 % — ABNORMAL LOW (ref 39.0–52.0)
HCT: 36.7 % — ABNORMAL LOW (ref 39.0–52.0)
HCT: 39.4 % (ref 39.0–52.0)
Hemoglobin: 12.5 g/dL — ABNORMAL LOW (ref 13.0–17.0)
Hemoglobin: 12.9 g/dL — ABNORMAL LOW (ref 13.0–17.0)
Hemoglobin: 13 g/dL (ref 13.0–17.0)
Hemoglobin: 13.9 g/dL (ref 13.0–17.0)
MCH: 29.3 pg (ref 26.0–34.0)
MCH: 29.3 pg (ref 26.0–34.0)
MCH: 29.5 pg (ref 26.0–34.0)
MCH: 29.5 pg (ref 26.0–34.0)
MCHC: 34.9 g/dL (ref 30.0–36.0)
MCHC: 35.1 g/dL (ref 30.0–36.0)
MCHC: 35.3 g/dL (ref 30.0–36.0)
MCHC: 35.4 g/dL (ref 30.0–36.0)
MCV: 82.9 fL (ref 78.0–100.0)
MCV: 83.4 fL (ref 78.0–100.0)
MCV: 83.8 fL (ref 78.0–100.0)
MCV: 84 fL (ref 78.0–100.0)
Platelets: 114 10*3/uL — ABNORMAL LOW (ref 150–400)
Platelets: 121 10*3/uL — ABNORMAL LOW (ref 150–400)
Platelets: 129 10*3/uL — ABNORMAL LOW (ref 150–400)
Platelets: 131 10*3/uL — ABNORMAL LOW (ref 150–400)
RBC: 4.27 MIL/uL (ref 4.22–5.81)
RBC: 4.37 MIL/uL (ref 4.22–5.81)
RBC: 4.4 MIL/uL (ref 4.22–5.81)
RBC: 4.75 MIL/uL (ref 4.22–5.81)
RDW: 13.2 % (ref 11.5–15.5)
RDW: 13.2 % (ref 11.5–15.5)
RDW: 13.3 % (ref 11.5–15.5)
RDW: 13.3 % (ref 11.5–15.5)
WBC: 4.8 10*3/uL (ref 4.0–10.5)
WBC: 5.9 10*3/uL (ref 4.0–10.5)
WBC: 6.7 10*3/uL (ref 4.0–10.5)
WBC: 6.7 10*3/uL (ref 4.0–10.5)

## 2010-07-13 LAB — BASIC METABOLIC PANEL
BUN: 10 mg/dL (ref 6–23)
BUN: 12 mg/dL (ref 6–23)
CO2: 26 mEq/L (ref 19–32)
CO2: 30 mEq/L (ref 19–32)
Calcium: 8.8 mg/dL (ref 8.4–10.5)
Calcium: 9.2 mg/dL (ref 8.4–10.5)
Chloride: 100 mEq/L (ref 96–112)
Chloride: 108 mEq/L (ref 96–112)
Creatinine, Ser: 0.88 mg/dL (ref 0.4–1.5)
Creatinine, Ser: 1.11 mg/dL (ref 0.4–1.5)
GFR calc Af Amer: >60 mL/min (ref >60)
GFR calc Af Amer: >60 mL/min (ref >60)
GFR calc non Af Amer: >60 mL/min (ref >60)
GFR calc non Af Amer: >60 mL/min (ref >60)
Glucose, Bld: 218 mg/dL — ABNORMAL HIGH (ref 70–99)
Glucose, Bld: 89 mg/dL (ref 70–99)
Potassium: 3.8 mEq/L (ref 3.5–5.1)
Potassium: 4 mEq/L (ref 3.5–5.1)
Sodium: 137 mEq/L (ref 135–145)
Sodium: 141 mEq/L (ref 135–145)

## 2010-07-13 LAB — POCT CARDIAC MARKERS
CKMB, poc: 1.2 ng/mL (ref 1.0–8.0)
CKMB, poc: 1.7 ng/mL (ref 1.0–8.0)
Myoglobin, poc: 102 ng/mL (ref 12–200)
Myoglobin, poc: 109 ng/mL (ref 12–200)
Troponin i, poc: <0.05 ng/mL (ref 0.00–0.09)
Troponin i, poc: <0.05 ng/mL (ref 0.00–0.09)

## 2010-07-13 LAB — COMPREHENSIVE METABOLIC PANEL
ALT: 27 U/L (ref 0–53)
AST: 21 U/L (ref 0–37)
Albumin: 3.4 g/dL — ABNORMAL LOW (ref 3.5–5.2)
Alkaline Phosphatase: 66 U/L (ref 39–117)
BUN: 11 mg/dL (ref 6–23)
CO2: 26 mEq/L (ref 19–32)
Calcium: 9 mg/dL (ref 8.4–10.5)
Chloride: 104 mEq/L (ref 96–112)
Creatinine, Ser: 0.88 mg/dL (ref 0.4–1.5)
GFR calc Af Amer: >60 mL/min (ref >60)
GFR calc non Af Amer: >60 mL/min (ref >60)
Glucose, Bld: 114 mg/dL — ABNORMAL HIGH (ref 70–99)
Potassium: 3.8 mEq/L (ref 3.5–5.1)
Sodium: 138 mEq/L (ref 135–145)
Total Bilirubin: 0.6 mg/dL (ref 0.3–1.2)
Total Protein: 5.8 g/dL — ABNORMAL LOW (ref 6.0–8.3)

## 2010-07-13 LAB — GLUCOSE, CAPILLARY
Glucose-Capillary: 109 mg/dL — ABNORMAL HIGH (ref 70–99)
Glucose-Capillary: 109 mg/dL — ABNORMAL HIGH (ref 70–99)
Glucose-Capillary: 118 mg/dL — ABNORMAL HIGH (ref 70–99)
Glucose-Capillary: 135 mg/dL — ABNORMAL HIGH (ref 70–99)
Glucose-Capillary: 143 mg/dL — ABNORMAL HIGH (ref 70–99)
Glucose-Capillary: 144 mg/dL — ABNORMAL HIGH (ref 70–99)
Glucose-Capillary: 147 mg/dL — ABNORMAL HIGH (ref 70–99)
Glucose-Capillary: 155 mg/dL — ABNORMAL HIGH (ref 70–99)
Glucose-Capillary: 162 mg/dL — ABNORMAL HIGH (ref 70–99)
Glucose-Capillary: 167 mg/dL — ABNORMAL HIGH (ref 70–99)
Glucose-Capillary: 190 mg/dL — ABNORMAL HIGH (ref 70–99)
Glucose-Capillary: 80 mg/dL (ref 70–99)
Glucose-Capillary: 98 mg/dL (ref 70–99)

## 2010-07-13 LAB — CARDIAC PANEL(CRET KIN+CKTOT+MB+TROPI)
CK, MB: 2.4 ng/mL (ref 0.3–4.0)
CK, MB: 2.4 ng/mL (ref 0.3–4.0)
CK, MB: 3.1 ng/mL (ref 0.3–4.0)
Relative Index: INVALID (ref 0.0–2.5)
Relative Index: INVALID (ref 0.0–2.5)
Relative Index: INVALID (ref 0.0–2.5)
Total CK: 60 U/L (ref 7–232)
Total CK: 73 U/L (ref 7–232)
Total CK: 84 U/L (ref 7–232)
Troponin I: 0.01 ng/mL (ref 0.00–0.06)
Troponin I: <0.01 ng/mL (ref 0.00–0.06)
Troponin I: <0.01 ng/mL (ref 0.00–0.06)

## 2010-07-13 LAB — LIPID PANEL
Cholesterol: 152 mg/dL (ref 0–200)
HDL: 32 mg/dL — ABNORMAL LOW (ref >39)
LDL Cholesterol: 98 mg/dL (ref 0–99)
Total CHOL/HDL Ratio: 4.8 RATIO
Triglycerides: 110 mg/dL (ref <150)
VLDL: 22 mg/dL (ref 0–40)

## 2010-07-13 LAB — LIPASE, BLOOD: Lipase: 26 U/L (ref 11–59)

## 2010-07-13 LAB — AMYLASE: Amylase: 47 U/L (ref 0–105)

## 2010-07-13 LAB — PROTIME-INR
INR: 0.98 (ref 0.00–1.49)
Prothrombin Time: 13.2 seconds (ref 11.6–15.2)

## 2010-07-13 LAB — TSH: TSH: 0.919 u[IU]/mL (ref 0.350–4.500)

## 2010-07-13 LAB — APTT: aPTT: 27 seconds (ref 24–37)

## 2010-07-14 NOTE — Progress Notes (Addendum)
Summary: sur clearance   Phone Note Call from Patient Call back at Home Phone [phone]   Caller: Spouse/peggy Reason for Call: Talk to Nurse Summary of Call: pt wife states pt needs sur. clearance for neck surgery Initial call taken by: Roe Coombs,  July 09, 2010 9:57 AM  Follow-up for Phone Call        [facility] Follow-up by: Lisabeth Devoid RN,  July 09, 2010 1:22 PM  Additional Follow-up for Phone Call Additional follow up Details #1::        cleared.  Reviewed Juanito Doom, MD      Appended Document: sur clearance LMOVM Mylo Red RN  Appended Document: sur clearance I spoke with pt Diplomatic Services operational officer. Surgery is at Dr. Wells Guiles office.  Mylo Red RN

## 2010-07-14 NOTE — Progress Notes (Signed)
  Phone Note Other Incoming   Request: Send information Summary of Call: Received patient request to have all records sent to [facility], request forwarded to [facility].

## 2010-08-04 ENCOUNTER — Ambulatory Visit: Payer: Medicare Other | Admitting: Endocrinology

## 2010-08-05 ENCOUNTER — Telehealth: Payer: Self-pay | Admitting: Cardiology

## 2010-08-05 LAB — BASIC METABOLIC PANEL
BUN: 10 mg/dL (ref 6–23)
BUN: 8 mg/dL (ref 6–23)
CO2: 27 mEq/L (ref 19–32)
CO2: 28 mEq/L (ref 19–32)
Calcium: 8.8 mg/dL (ref 8.4–10.5)
Calcium: 9.2 mg/dL (ref 8.4–10.5)
Chloride: 102 mEq/L (ref 96–112)
Chloride: 108 mEq/L (ref 96–112)
Creatinine, Ser: 0.8 mg/dL (ref 0.4–1.5)
Creatinine, Ser: 0.91 mg/dL (ref 0.4–1.5)
GFR calc Af Amer: >60 mL/min (ref >60)
GFR calc Af Amer: >60 mL/min (ref >60)
GFR calc non Af Amer: >60 mL/min (ref >60)
GFR calc non Af Amer: >60 mL/min (ref >60)
Glucose, Bld: 196 mg/dL — ABNORMAL HIGH (ref 70–99)
Glucose, Bld: 311 mg/dL — ABNORMAL HIGH (ref 70–99)
Potassium: 3.5 mEq/L (ref 3.5–5.1)
Potassium: 3.6 mEq/L (ref 3.5–5.1)
Sodium: 136 mEq/L (ref 135–145)
Sodium: 139 mEq/L (ref 135–145)

## 2010-08-05 LAB — POCT CARDIAC MARKERS
CKMB, poc: 3.7 ng/mL (ref 1.0–8.0)
Myoglobin, poc: 340 ng/mL (ref 12–200)
Troponin i, poc: <0.05 ng/mL (ref 0.00–0.09)

## 2010-08-05 LAB — PROTIME-INR
INR: 1.05 (ref 0.00–1.49)
Prothrombin Time: 13.6 seconds (ref 11.6–15.2)

## 2010-08-05 LAB — HEMOGLOBIN A1C
Hgb A1c MFr Bld: 10.1 % — ABNORMAL HIGH (ref 4.6–6.1)
Mean Plasma Glucose: 243 mg/dL

## 2010-08-05 LAB — DIFFERENTIAL
Basophils Absolute: 0 10*3/uL (ref 0.0–0.1)
Basophils Absolute: 0 10*3/uL (ref 0.0–0.1)
Basophils Relative: 1 % (ref 0–1)
Basophils Relative: 1 % (ref 0–1)
Eosinophils Absolute: 0 10*3/uL (ref 0.0–0.7)
Eosinophils Absolute: 0.1 10*3/uL (ref 0.0–0.7)
Eosinophils Relative: 1 % (ref 0–5)
Eosinophils Relative: 2 % (ref 0–5)
Lymphocytes Relative: 20 % (ref 12–46)
Lymphocytes Relative: 37 % (ref 12–46)
Lymphs Abs: 1.2 10*3/uL (ref 0.7–4.0)
Lymphs Abs: 2 10*3/uL (ref 0.7–4.0)
Monocytes Absolute: 0.5 10*3/uL (ref 0.1–1.0)
Monocytes Absolute: 0.5 10*3/uL (ref 0.1–1.0)
Monocytes Relative: 8 % (ref 3–12)
Monocytes Relative: 9 % (ref 3–12)
Neutro Abs: 2.8 10*3/uL (ref 1.7–7.7)
Neutro Abs: 4.3 10*3/uL (ref 1.7–7.7)
Neutrophils Relative %: 52 % (ref 43–77)
Neutrophils Relative %: 71 % (ref 43–77)

## 2010-08-05 LAB — GLUCOSE, CAPILLARY
Glucose-Capillary: 180 mg/dL — ABNORMAL HIGH (ref 70–99)
Glucose-Capillary: 211 mg/dL — ABNORMAL HIGH (ref 70–99)

## 2010-08-05 LAB — CBC
HCT: 35.3 % — ABNORMAL LOW (ref 39.0–52.0)
HCT: 38.2 % — ABNORMAL LOW (ref 39.0–52.0)
Hemoglobin: 12.3 g/dL — ABNORMAL LOW (ref 13.0–17.0)
Hemoglobin: 13.1 g/dL (ref 13.0–17.0)
MCHC: 34.3 g/dL (ref 30.0–36.0)
MCHC: 34.8 g/dL (ref 30.0–36.0)
MCV: 87.2 fL (ref 78.0–100.0)
MCV: 87.6 fL (ref 78.0–100.0)
Platelets: 118 10*3/uL — ABNORMAL LOW (ref 150–400)
Platelets: 120 10*3/uL — ABNORMAL LOW (ref 150–400)
RBC: 4.05 MIL/uL — ABNORMAL LOW (ref 4.22–5.81)
RBC: 4.36 MIL/uL (ref 4.22–5.81)
RDW: 14.1 % (ref 11.5–15.5)
RDW: 14.6 % (ref 11.5–15.5)
WBC: 5.4 10*3/uL (ref 4.0–10.5)
WBC: 6 10*3/uL (ref 4.0–10.5)

## 2010-08-05 LAB — CARDIAC PANEL(CRET KIN+CKTOT+MB+TROPI)
CK, MB: 4.6 ng/mL — ABNORMAL HIGH (ref 0.3–4.0)
CK, MB: 5.3 ng/mL — ABNORMAL HIGH (ref 0.3–4.0)
Relative Index: 2.4 (ref 0.0–2.5)
Relative Index: 2.5 (ref 0.0–2.5)
Total CK: 192 U/L (ref 7–232)
Total CK: 215 U/L (ref 7–232)
Troponin I: 0.01 ng/mL (ref 0.00–0.06)
Troponin I: 0.02 ng/mL (ref 0.00–0.06)

## 2010-08-05 LAB — D-DIMER, QUANTITATIVE: D-Dimer, Quant: 0.38 ug/mL-FEU (ref 0.00–0.48)

## 2010-08-05 NOTE — Telephone Encounter (Signed)
Pt took bp 178/105 at 430p- and at 245p was 186/88 -pls advise

## 2010-08-05 NOTE — Telephone Encounter (Signed)
I spoke with pt and he is taking all of his bp medications.  He states he feels fine. Denies headache, blurred vision, dizziness, or chest discomfort.  His bp has been today: 186/88, 178/105, 188/82.  Dr. Daleen Squibb is aware and pt advised to continue to monitor daily bp and call if bp continues to remain elevated.  Pt acknowledged understanding and will continue to monitor. Mylo Red RN

## 2010-08-07 ENCOUNTER — Telehealth: Payer: Self-pay | Admitting: Physician Assistant

## 2010-08-07 LAB — GLUCOSE, CAPILLARY
Glucose-Capillary: 200 mg/dL — ABNORMAL HIGH (ref 70–99)
Glucose-Capillary: 214 mg/dL — ABNORMAL HIGH (ref 70–99)
Glucose-Capillary: 220 mg/dL — ABNORMAL HIGH (ref 70–99)

## 2010-08-07 LAB — DIFFERENTIAL
Basophils Absolute: 0 10*3/uL (ref 0.0–0.1)
Basophils Relative: 1 % (ref 0–1)
Eosinophils Absolute: 0.1 10*3/uL (ref 0.0–0.7)
Eosinophils Relative: 1 % (ref 0–5)
Lymphocytes Relative: 39 % (ref 12–46)
Lymphs Abs: 2.1 10*3/uL (ref 0.7–4.0)
Monocytes Absolute: 0.6 10*3/uL (ref 0.1–1.0)
Monocytes Relative: 11 % (ref 3–12)
Neutro Abs: 2.7 10*3/uL (ref 1.7–7.7)
Neutrophils Relative %: 49 % (ref 43–77)

## 2010-08-07 LAB — BASIC METABOLIC PANEL
BUN: 12 mg/dL (ref 6–23)
CO2: 32 mEq/L (ref 19–32)
Calcium: 9.1 mg/dL (ref 8.4–10.5)
Chloride: 101 mEq/L (ref 96–112)
Creatinine, Ser: 1.11 mg/dL (ref 0.4–1.5)
GFR calc Af Amer: >60 mL/min (ref >60)
GFR calc non Af Amer: >60 mL/min (ref >60)
Glucose, Bld: 276 mg/dL — ABNORMAL HIGH (ref 70–99)
Potassium: 3.3 mEq/L — ABNORMAL LOW (ref 3.5–5.1)
Sodium: 135 mEq/L (ref 135–145)

## 2010-08-07 LAB — CARDIAC PANEL(CRET KIN+CKTOT+MB+TROPI)
CK, MB: 5.6 ng/mL — ABNORMAL HIGH (ref 0.3–4.0)
Relative Index: 1.9 (ref 0.0–2.5)
Total CK: 290 U/L — ABNORMAL HIGH (ref 7–232)
Troponin I: 0.02 ng/mL (ref 0.00–0.06)

## 2010-08-07 LAB — CBC
HCT: 36.4 % — ABNORMAL LOW (ref 39.0–52.0)
Hemoglobin: 12.8 g/dL — ABNORMAL LOW (ref 13.0–17.0)
MCHC: 35.3 g/dL (ref 30.0–36.0)
MCV: 87.3 fL (ref 78.0–100.0)
Platelets: 115 10*3/uL — ABNORMAL LOW (ref 150–400)
RBC: 4.17 MIL/uL — ABNORMAL LOW (ref 4.22–5.81)
RDW: 14 % (ref 11.5–15.5)
WBC: 5.5 10*3/uL (ref 4.0–10.5)

## 2010-08-07 LAB — POCT CARDIAC MARKERS
CKMB, poc: 3 ng/mL (ref 1.0–8.0)
Myoglobin, poc: 251 ng/mL (ref 12–200)
Troponin i, poc: <0.05 ng/mL (ref 0.00–0.09)

## 2010-08-07 LAB — HEMOGLOBIN A1C
Hgb A1c MFr Bld: 11 % — ABNORMAL HIGH (ref 4.6–6.1)
Mean Plasma Glucose: 269 mg/dL

## 2010-08-07 NOTE — Telephone Encounter (Signed)
Returned call from patient concerning elevated BP.  Spoke with TW on 4/4 and was asked to keep recordings of his BP.  SBP running from 158-200 and DBP 91-116.  Pt denies any blurred vision, lightheadedness, CP, SOB, N/v.  He has taken his BP around 6 times by 11a today.  He was told to call back today with his readings and if they remained high there would be further discussion as patient has a history of hypotension.  With his pressures still remaining high, I have asked pt to increase his lisinopril to 30 mg daily and he is to take an extra half tablet today as he has already taken his 20 mg today.  The patient voiced understanding.  I have asked him to only take his BP 3 times a day and record those readings.  If his BP remains high then we can discuss adding norvasc as he has been on this in the past.  If the patients BP remains above SBP 200 or he becomes symptomatic he will come to the ER.  Pt appreciated the call back.

## 2010-08-11 ENCOUNTER — Other Ambulatory Visit: Payer: Self-pay | Admitting: *Deleted

## 2010-08-11 MED ORDER — LISINOPRIL 20 MG PO TABS: 20.0000 mg | ORAL_TABLET | Freq: Every day | ORAL | Status: DC

## 2010-08-13 ENCOUNTER — Telehealth: Payer: Self-pay | Admitting: Cardiology

## 2010-08-13 ENCOUNTER — Telehealth: Payer: Self-pay | Admitting: *Deleted

## 2010-08-13 NOTE — Telephone Encounter (Signed)
Error

## 2010-08-13 NOTE — Telephone Encounter (Signed)
Pt blood pressure has 202/121. No chest pain no sob. Pt has a headache

## 2010-08-13 NOTE — Telephone Encounter (Signed)
Lance [name] returns call regarding metoprolol tartrate. Prescription bottle is for 50mg  bid.  Last office note indicates Metoprolol tartrate  50mg  2 tablets twice a day.  Prescription called in to [facility]  Pt will keep bp log. Mylo Red RN

## 2010-08-13 NOTE — Telephone Encounter (Signed)
Pt calls today b/c his blood pressure has been "running high".   Today:   8am bp: 158/88   12noon : 202/121    Reports he has a little headache            08/12/10       Bp: 147/85     pm  184/108 08/11/10       Bp: 147/85     pm  190/109 08/10/10       Bp: 180/104          196/105  Hr 90   He is not sure about his medications, in particular his metoprolol and will call back.  In the meantime, I will forward to Dr. Daleen Squibb for review. Mylo Red RN

## 2010-09-15 NOTE — Assessment & Plan Note (Signed)
[facility]                         ELECTROPHYSIOLOGY OFFICE NOTE   NAME:Buttermore, KEYANTE DURIO                       MRN:          [id number]  DATE:07/07/2007                            DOB:          [birth year]    Mr. Yohe returns today for followup.  He is a very pleasant middle-aged  male with an ischemic cardiomyopathy, coronary artery disease, status  post VF arrest, status post ICD implantation, who returns today for  followup.  His main complaint today is that of difficulty getting around  secondary to severe arthritis.  He is considering knee replacement  surgery.  He denies chest pain or shortness of breath and has received  no intercurrent IC therapies.  For his diabetes, he has recently seen  Dr. Everardo All.   PRESENT MEDICATIONS:  1. Humalog insulin.  2. Lantus insulin.  3. Amlodipine 5 mg daily.  4. Aspirin 325 a day.  5. Fish oil daily.  6. Lisinopril 5 mg daily.  7. Metformin 1 g twice daily.  8. Metoprolol 100 mg daily.  9. Vitamin B12.  10.Prevacid 30 a day.   PHYSICAL EXAM:  He is a pleasant well-appearing middle-aged man in no  acute distress.  Blood pressure was 100/62, the pulse 75 and regular, respirations were  18.  The weight was 245 pounds  NECK:  Revealed no jugular distention.  LUNGS:  Clear bilaterally to auscultation.  No wheezes, rales or rhonchi  were present.  There was no increased work of breathing.  CARDIOVASCULAR:  Exam revealed a regular rate and rhythm with normal S1  and S2.  There were no murmurs, rubs or gallops that I appreciated.  EXTREMITIES:  Demonstrated no edema.  ABDOMEN:  Soft, nontender, nondistended.   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  The P R waves were greater than 3 and 9, respectively, the impedance 510  in the A and 335 in the V.  The threshold was at 1.25 at 0.5 in the A  and 0.75 at 0.5 in the V.  The battery voltage was 3 volts.   IMPRESSION:  1.  Ischemic cardiomyopathy.  2. Ventricular fibrillation arrest.  3. Status post implantable cardioverter-defibrillator insertion.   DISCUSSION:  Overall, Mr. Durkee is stable and his defibrillator is  working normally.  We will see him back in the office in 1 year.     Doylene Canning. Ladona Ridgel, MD  Electronically Signed    GWT/MedQ  DD: 07/07/2007  DT: 07/09/2007  Job #: [id number]

## 2010-09-15 NOTE — Letter (Signed)
November 15, 2006    Scott A. Gerda Diss, MD  [address]  [place], [place] [address]   RE:  PLEASANT, BENSINGER  MRN:  [id number]  /  DOB:  1945-12-05   Dear Lorin Picket:   Mr. Kowalewski returns to the office as scheduled for continued assessment  and treatment of coronary disease, history of ventricular arrhythmias,  and multiple cardiovascular risk factors.  Since I last saw him a few  months ago, he has done superbly.  Hospital records were obtained and  reviewed regarding ankle surgery.  This has been quite successful.  Pain  is relieved and his mobility is greatly increased.  He has had no  dyspnea nor chest discomfort.  His fatigue has resolved.  He is losing  weight, continuing to decrease his use of chewing tobacco, currently 5  packs per week, and generally feeling much more positive than he has in  the past.   MEDICATIONS:  Are extensive and unchanged from prior notes.   EXAMINATION:  Pleasant gentleman in no acute distress.  The weight is  228, 5 pounds less than at his last visit.  Blood pressure 120/80, heart  rate 88 and regular, respirations 16.  NECK:  No jugular venous distention; surgical scar at the base of the  left anterior neck; normal carotid upstrokes without bruits.  LUNGS:  Clear.  CARDIAC:  Normal first and second heart sounds; minimal systolic murmur  at the cardiac base; normal PMI.  ABDOMEN:  Soft and nontender; aortic pulsation not palpable; no masses  nor organomegaly.  EXTREMITIES:  One-half plus ankle edema.   Recent lipid profile is excellent with total cholesterol of 133,  triglycerides 174, HDL 32 and LDL of 66.  Chemistry profile and renal  function are normal, as are LFTs.   Hemoglobin A1c level is 9.5.  Diabetic control was poor in recent weeks,  but has now improved.  Plans are for referral to an endocrinologist.  I  will see Mr. Dowe again in 6 months, sooner if he experiences  additional problems with which I can assist.  His defibrillator was  checked in January of this year and is otherwise monitored  transtelephonically.    Sincerely,      [name], MD, [facility]  Electronically Signed    RMR/MedQ  DD: 11/15/2006  DT: 11/15/2006  Job #: [id number]

## 2010-09-15 NOTE — Letter (Signed)
May 10, 2007    Lance A. Gerda Diss, MD  [address], [address]  [place], [place] [address]   RE:  Lance [name], Lance [name]  MRN:  [id number]  /  DOB:  01-12-1946   Dear Lance [name]:   Lance [name] returns to the office for continued assessment and treatment  of ischemic heart disease.  Since his last visit, he has done quite  well.  He underwent uncomplicated arthroscopic surgery of his left knee  with improvement in chronic pain, but was told that total knee  arthroplasty will ultimately be necessary.  He has had some issues with  his blood pressure, which has been too high on occasions.  At other  times, he has had some orthostatic fatigue and perhaps some mild  lightheadedness.  He is congratulated on completely giving up tobacco  use, but has noted some weight gain.   MEDICATIONS:  Extensive, but unchanged except for the addition of  Clomifene by Dr. Everardo All at a dose of 25 mg 3 days per week.   Lance [name] is limited in terms of activity due to chronic orthopedic  problems.  He does experience exertional dyspnea on fairly mild effort.  He has had some lightheadedness but no syncope.   EXAM:  Pleasant gentleman sounding and looking better than I have seen  him for some time.  Weight is 246, 18 pounds more than in July.  Blood pressure 145/80,  heart rate 76 and regular.  After lying for 5 minutes, the blood  pressure was 165/80.  With standing, blood pressure fell to 120/70.  After walking for a minute or two, blood pressure increased to 175/90.  NECK:  No jugular venous distention; no carotid bruits.  LUNGS:  Clear.  CARDIAC:  Normal first and second heart sounds; modest basilar systolic  ejection murmur.  ABDOMEN:  Soft and nontender; no organomegaly.  EXTREMITIES:  No edema; distal pulses intact.   Most recent laboratory includes a normal chemistry profile in December  except for a glucose of 260.  BNP was normal.  A sleep study was  performed in October revealing insignificant sleep apnea.   He has had a  number of endocrine studies by Dr. Everardo All.  TSH was normal.  Lipid  profile is good in July with total cholesterol of 133, triglycerides of  174, HDL 32 and LDL of 66.   IMPRESSION:  Lance [name] is doing well from a cardiac standpoint.  He has  a component of orthostatic hypotension, which will not permit tight  control of blood pressure.  His current regimen appears optimal.  He  will monitor blood pressure at home and call for excessively high or low  values.  We will repeat a lipid profile and chemistry profile in 3  months.  He is scheduled for defibrillator reassessment by Dr. Ladona [name] in  2 months.  I will see this nice gentleman again in 6 months.    Sincerely,      Lance Friends. Dietrich Pates, MD, [facility]  Electronically Signed    RMR/MedQ  DD: 05/10/2007  DT: 05/10/2007  Job #: [id number]

## 2010-09-15 NOTE — H&P (Signed)
NAME:  Lance [name], CARIGNAN NO.:  [id number]   MEDICAL RECORD NO.:  [id number]          PATIENT TYPE:  OBV   LOCATION:  3733                         FACILITY:  [facility]   PHYSICIAN:  Lance Friends. Dietrich Pates, MD, FACCDATE OF BIRTH:  09-29-1945   DATE OF ADMISSION:  11/17/2007  DATE OF DISCHARGE:                              HISTORY & PHYSICAL   PRIMARY CARDIOLOGIST:  Lance Friends. Dietrich Pates, MD, [facility]   PRIMARY CARE PHYSICIAN:  Lance [name].   HISTORY OF PRESENT ILLNESS:  This 65 year old Caucasian male with known  history of CAD, MIs x6 drug-eluting stent to the circ in 2005, with  history of ischemic cardiomyopathy and sudden cardiac death.  The  patient also has a history of ICD placement secondary to the sudden  cardiac death, diabetes, and hypertension.  The patient was with  stirring pain this morning around 10 a.m. and had sudden complaint of  chest tightness; nonradiating, not associated with shortness of breath,  nausea, vomiting, or diaphoresis.  The patient went home and used one  nitroglycerin spray.  The pain was diminished.  I talked to his wife and  she called EMS and had him brought to the emergency room.  He did have  some mild discomfort in his chest.  During transport, received one more  nitroglycerin spray and pain was relieved on arrival to the emergency  room.  The patient admits to not taking any anti-hypertensive  medications secondary to dizziness and hypotension for which she  reported to [name] office approximately 1 week ago.  He was  taking lisinopril and Norvasc, they asked him to stop taking it and to  have a follow up appointment this week with Dr. Dietrich [name].  The patient  is seen in the emergency room prior to follow up with Dr. Dietrich [name].   REVIEW OF SYSTEMS:  Positive for chest pain denying any other symptoms  of shortness of breath, nausea, vomiting, or diaphoresis.   PAST MEDICAL HISTORY:  1. CAD with history of sudden cardiac death.   a.     Myocardial infarctions x6.      b.     Status post PCI to the large second marginal branch of the       circumflex using a Cypher DES stent 3.0 x 21 mm in 2005.  2. Ischemic cardiomyopathy, status post [name] AICD pacemaker      secondary to V-Fib, V-Tach arrest.  3. Diabetes.  4. Hypertension.  5. Hypercholesterolemia.  6. Chronic renal insufficiency.  7. Thrombocytopenia.  8. Degenerative joint disease.  A:  Status post left total knee arthroscopy in May 2009.  B.  GERD.   SOCIAL HISTORY:  The patient lives in [place] with his wife.  He is a  40-pack-year smoker, but quit 3 years ago.  Occasional use of alcohol.   FAMILY HISTORY:  Not significant secondary to age in the patient's  diagnoses.   CURRENT MEDICATIONS AT HOME:  There is a lengthy list;  1. Vitamin C 500 mg tablet by mouth daily.  2. Percocet 5/325 mg 1-2 tablets by mouth every  4 hours p.r.n. pain.  3. Lantus insulin 50 units subcu at bedtime.  4. Humalog insulin 30 units q.a.m.; 10 units, at lunch 35 units at      night, and 20 units with snacks.  5. Clomid 50 mg one-quarter tablet by mouth every Monday, Wednesday,      and Friday.  6. Xanax 1 mg tablet by mouth daily as needed.  7. Norco 7.5/325 tablets by mouth q.4 hours p.r.n. pain.  8. Norvasc 5 mg tablet by mouth every a.m. (this has been on hold).  9. Omeprazole 40 mg 1 tablet by mouth every a.m.  10.Flomax 0.4 mg 1 tablet by mouth twice daily.  11.Lipitor 80 mg 1 tablet by mouth daily at bedtime.  12.Metformin 1000 mg by mouth twice a day.  13.Lisinopril 5 mg by mouth daily.  14.Metoprolol ER 100 mg by mouth daily.  15.Citalopram 40 mg 1 tablet by mouth daily.  16.Fish oil 500 mg tablet by mouth daily.  17.Nabumetone 500 mg 2 tablets by mouth daily.  18.Fexofenadine 180 mg tablet by mouth daily.   ALLERGIES:  PENICILLIN.   LABORATORY DATA:  Current labs; sodium 140, potassium 4.0, chloride 106,  CO2 of 24, BUN 10, creatinine 1.0, glucose  85, hemoglobin 10.9,  hematocrit 32.0, white blood cells 5.9, and platelets 142.  Point-of-  care markers; CK 52.9, MB less than 1.0, troponin less than 0.05, PTT  30, PT 14.2, and INR 1.1.  Chest x-ray revealing no acute  cardiopulmonary process.  EKG revealing normal sinus rhythm with  ventricular rate of 77 beats per minute.   PHYSICAL EXAMINATION:  CURRENT VITAL SIGNS:  Blood pressure 138/78,  pulse 66, respirations 22, temperature 97.1, and O2 sat 99% on room air.  HEENT:  Head is normocephalic and atraumatic.  Eyes:  PERRLA.  Mucous  membranes, mouth pink and moist.  Tongue is midline.  NECK:  Supple without JVD or carotid bruits appreciated.  CARDIOVASCULAR:  Regular rate and rhythm without murmurs, rubs, or  gallops.  Distant heart sounds.  Pulses are 2+ and equal without bruits.  LUNGS:  Essentially clear to auscultation with some mild bibasilar  crackles noted.  ABDOMEN:  Soft and nontender, 2+ bowel sounds.  No rebound or guarding.  EXTREMITIES:  Without clubbing, cyanosis, or edema.  NEURO:  Cranial nerves II-XII grossly intact.   IMPRESSION:  1. Angina in patient with known coronary artery disease, percutaneous      coronary intervention, and myocardial infarction in the past.  2. History of ischemic cardiomyopathy, status post automatic      implantable cardioverter-defibrillator pacemaker.  3. Status post left total knee replacement in May 2009.  4. Insulin-dependent diabetes.  5. Hypertension.  6. Hyperlipidemia.   PLAN:  This is a 65 year old Caucasian male with known history of CAD,  ischemic cardiomyopathy, diabetes, and hypertension who presented to the  emergency room after experiencing chest pain, while starting pain  earlier today, relieved with nitroglycerin.  The patient was seen and  examined by myself and Lance [name] in the emergency room.  The  patient admits to not taking hypertensives, Norvasc, and lisinopril at  the request of the office  secondary to hypotension.  The patient was  told to take blood pressure at home, sitting and standing, and wife  noted that the patient's blood pressure was elevated diastolically to  over 100.  He was to follow-up with Dr. Dietrich [name] in early part of next  week, but presented to the emergency  room with recurrent chest pain.   The patient will be admitted for 24-hour observation.  We will cycle  cardiac enzymes and check hemoglobin A1c.  We will restart Norvasc and  lisinopril.  Monitor blood pressure.  We will consider further cardiac  intervention if cardiac enzymes are normal.  In the interim, the patient  will not be started on nitroglycerin or heparin at this time.  He did  have a recent Myoview in May 2009, revealing a small persistent defect  in the inferolateral wall, but no evidence of ischemia.  The patient  will return home in the a.m. if cardiac enzymes are negative.  If  positive, we will make further recommendations as Dr. Dietrich [name] will see  him over the weekend.      Bettey Mare. Lyman Bishop, NP      Lance Friends. Dietrich Pates, MD, [facility]  Electronically Signed    KML/MEDQ  D:  11/17/2007  T:  11/18/2007  Job:  [id number]   cc:   Lance [name]

## 2010-09-15 NOTE — H&P (Signed)
NAME:  Lance [name], Lance [name] NO.:  [id number]   MEDICAL RECORD NO.:  [id number]          PATIENT TYPE:  INP   LOCATION:  NA                           FACILITY:  [facility]   PHYSICIAN:  Jene Every, M.D.    DATE OF BIRTH:  [birth year]   DATE OF ADMISSION:  DATE OF DISCHARGE:                              HISTORY & PHYSICAL   CHIEF COMPLAINTS:  Left knee pain.   HISTORY:  [name] is well-known to our practice.  He has a  longstanding history of bilateral knee pain, left greater than right.  He has undergone conservative treatment including cortisone injections  as well as knee arthroscopy with minimal relief of his symptoms.  The  patient describes his pain as disabling at this point, and it is felt he  would benefit from a total knee arthroplasty.  The risks and benefits  were discussed with the patient.  He did obtain medical clearance from  his medical physician as well as his cardiologist.  He does elect to  proceed.   MEDICAL HISTORY:  Significant for insulin dependent diabetes.  Hypertension.  Significant history of coronary artery disease.  MI times  6.  Gastroesophageal reflux disease.  Defibrillator and pacemaker in  place.  Hypercholesteremia.   CURRENT MEDICATIONS:  1. Include insulin which is Lantus 55 units before bedtime.  2. Humalog 30 units q. a.m., 10 units at lunch, 35 units at night with      meal, and 10 units at bedtime.  3. Xanax 1 mg p.o. p.r.n.  4. Flomax 0.4 mg 2 p.o. daily.  5. Clomiphene 50 mg 1/4 tab Monday, Wednesday, and Friday.  6. Prevacid 30 mg 1 p.o. daily.  7. Aspirin 325 mg 1 p.o. daily.  8. Lipitor 80 mg 1 p.o. daily.  9. Metformin 1000 mg 1 p.o. b.i.d.  10.Lisinopril 5 mg 1/2 tab p.o. daily.  11.Plavix 75 mg 1 p.o. daily.  12.Metoprolol 120 mg 1 p.o. daily.  13.Citalopram 40 mg 1 p.o. q. a.m.  14.Nabumetone 1000 mg 1 p.o. q. a.m.  15.Fexofenadine 180 mg p.o. daily.  16.Hydrocodone 7.5 p.r.n.   ALLERGIES:  PENICILLIN which  causes hives.   PREVIOUS SURGERY:  Includes microdiskectomy of the lumbar and cervical  spine, stent placement x5, insertion of an AICD.   SOCIAL:  The patient is married.  He is a 63+ pack year smoker and quit  3 years ago.  He used to be an alcoholic, quit approximately 20 years  ago.  He has 2 children.   __________  surgery.   PRIMARY CARE PHYSICIAN:  Dr. Lilyan Punt.   DIABETIC SPECIALIST:  Dr. Everardo All.   CARDIOLOGIST:  Dr. Dietrich Pates.   KIDNEY DOCTOR:  Dr. Vonita Lance [name].   FAMILY HISTORY:  Father deceased at age 59 of MI.  Mother has history of  diabetes.  There are renal issues.  A 55-year sibling has a history of  coronary artery disease.   REVIEW OF SYSTEMS:  General:  The patient denies any fever, chills,  night sweats or bleeding tendencies.  He did have a previous history of  staph infection during his neck surgery.  CNS:  No blurred or double  vision, seizure, headache, or paralysis.  Respiratory:  No shortness of  breath, productive cough or hemoptysis.  Cardiovascular:  No history of  chest pain history orthopnea.  GU:  No dysuria, hematuria, or discharge.  GI: No nausea, vomiting, diarrhea, constipation, melena, or bloody  stools.  Musculoskeletal:  As pertinent in the HPI.   PHYSICAL EXAM:  Pulse is 62, respiratory 12, BP 160/80.  GENERAL:  This well-developed gentleman sitting upright in no acute  distress.  He does walk with antalgic gait utilizing a cane.  HEENT: Atraumatic, normocephalic.  Pupils equal round and reactive to  light.  TMs intact.  NECK:  Supple.  No lymphadenopathy.  CHEST:  Clear to auscultation bilaterally.  HEART:  Regular rate and rhythm without murmurs, gallops or rubs.  ABDOMEN:  Soft, nontender, nondistended.  Bowel sounds x4.  SKIN:  No rashes or lesions are noted.  Regarding the knee, he does have  mild effusion along the left knee and is tender on the medial joint  line.  He does note decreased range of motion.  Calves soft,  nontender  without evidence of DVT.   LABORATORY AND X-RAY:  The patient did undergo a stress test revealing a  small persistent defect in the inferior lateral region consistent with  either diaphragmatic attenuation or a small area of nontransmural  scarring.  No evidence of myocardial ischemia.  Preserved left  ventricular systolic function.  He did obtain clearance from Dr.  Dietrich Pates who felt he was at increased but acceptable risk for surgery.   PLAN:  The patient was admitted to [place] on hospital to undergo a left  total knee arthroplasty.  He will need, per Dr. Dietrich Pates, his  defibrillator deactivated in the operating room.  He will need to be off  his Plavix.  Will place on Coumadin postoperatively.  We may need  consult them to assist with the patient's care.      Roma Schanz, P.A.      Jene Every, M.D.  Electronically Signed   CS/MEDQ  D:  09/12/2007  T:  09/12/2007  Job:  [id number]

## 2010-09-15 NOTE — Consult Note (Signed)
NAME:  Lance [name], Lance [name] NO.:  [id number]   MEDICAL RECORD NO.:  [id number]          PATIENT TYPE:  INP   LOCATION:  1617                         FACILITY:  [facility]   PHYSICIAN:  Michelene Gardener, MD    DATE OF BIRTH:  July 30, 1945   DATE OF CONSULTATION:  09/14/2007  DATE OF DISCHARGE:                                 CONSULTATION   REASON FOR CONSULTATION:  Worsening renal function and decreased urine  output.   HISTORY OF PRESENT ILLNESS:  This is a 65 year old Caucasian male with  past medical history of multiple medical problems presented to the  hospital with left knee pain.  According to the history the patient has  been having prolonged history of bilateral knee pain effecting both of  his knees especially the left side.  He is being treated as an  outpatient with medical treatment which has not been very successful.  The patient was admitted to orthopedic surgery for total knee  arthroplasty.  The patient underwent a total knee arthroplasty on Sep 13, 2007.  Following that his kidney function deteriorated from 0.88 on  May 6 up to 2.88 on May 14.  He also had low urine output.  Hemoglobin  remained stable at 11.2.  His procedure went well on May 13.   PAST MEDICAL HISTORY:  1. Insulin-dependent diabetes mellitus.  2. Hypertension.  3. Coronary artery disease.  4. Gastroesophageal reflux disease.  5. Status post defibrillator pacemaker.  6. Hyperlipidemia.  7. Anxiety.  8. Depression.   CURRENT MEDICATIONS:  1. Insulin Lantus 55 units before bedtime.  2. Humalog 30 units q.a.m., 10 units at lunch, and 35 units at night.  3. Xanax 1 mg p.o. as needed.  4. Flomax 0.4 mg twice daily.  5. Clomycin 50 mg to take 1/4 tablet on Monday/Wednesday/Friday.  6. Prevacid 30 mg once a day.  7. Aspirin 325 mg once a day.  8. Lipitor 80 mg once a day.  9. Metformin 1000 mg twice daily.  10.Lisinopril 5 mg to take half tablet once a day.  11.Plavix 75 mg once a  day.  12.Metoprolol 120 mg twice daily.  13.Citalopram 40 mg once a day.  14.Nabumetone 1000 mg once a day.  15.Allegra 180 mg once a day.  16.Hydrocodone 7.5 mg as needed.   PAST SURGICAL HISTORY:  1. Stent placement August 2003 or September 2003.  2. Lumbar diskectomy in 2000.  3. Anterior cervical diskectomy and fusion of C4 to C6 in 2002.   ALLERGIES:  PENICILLIN.   SOCIAL HISTORY:  The patient denied tobacco.  He denied alcohol use and  he denied recreational drugs.  He is married.  His wife is in good  health and will be available to help as needed.   FAMILY HISTORY:  Significant for hypertension.   REVIEW OF SYSTEMS:  CONSTITUTIONAL:  No fatigability.  EYES:  No blurred  vision.  ENT:  No tinnitus.  RESPIRATORY:  No cough and no wheezes.  CARDIOVASCULAR:  No chest pain and no shortness of breath.  GASTROINTESTINAL:  No nausea, no vomiting,  and no diarrhea.  GENITOURINARY:  No dysuria and no hematuria.  ENDOCRINE:  No biliuria.  HEMATOLOGY:  No bruises and no bleeding.  ID:  No rash and no lesions.  NEUROLOGY:  No numbness or tingling.  The rest of systems were reviewed  and they were negative.   PHYSICAL EXAMINATION:  VITAL SIGNS:  Temperature 97.9, pulse 94,  respiratory rate 14, blood pressure 144/83.  GENERAL:  This is a middle age Caucasian male in no acute distress.  HEENT:  Conjunctivae pink.  Pupils equal, round, and reactive to light.  There is no ptosis or hearing difficulty.  There no ear discharge or  infection.  There is no infection or bleeding.  Oral mucosa is dry with  no pharyngeal erythema.  NECK:  Supple.  No JVD.  No carotid bruits.  No lymphadenopathy.  No  thyromegaly.  CARDIOVASCULAR:  Regular.  There are no murmurs, no gallops, and no  thrills.  LUNGS:  The patient is breathing between 16-18.  There are no rales, no  rhonchi, and no wheezing.  ABDOMEN:  Firm, distended.  There is no tenderness.  There is no  hepatomegaly.  Bowel sounds are  normal.  EXTREMITIES:  Lower extremities; the patient has drainage from her left  knee.  NEUROLOGY:  Cranial nerves II-XII grossly intact.  There is no motor or  sensory deficits.   LABORATORY DATA:  Sodium 134, potassium 4.5, chloride 103, bicarb 24,  glucose 182, BUN 25, creatinine improved from 2.88 to 2.40.  INR 1.1.  WBC 8.7, hemoglobin 11.2, hematocrit 33.0. MCV 87.5.  Platelet count  124.   IMPRESSION:  1. Acute renal failure.  2. Thrombocytopenia.  3. Mild anemia.  4. Hypertension.  5. Diabetes.  6. Hyperlipidemia.  7. History of depression.  8. Degenerative joint disease status post total knee replacement.   PLAN:  1. Acute renal failure.  This patient had worsening creatinine from      0.88 up to 2.88 from May 6 up to May 14.  His BUN on May 6 was 9      and it was 25 on May 14.  His renal failure should be      multifactorial.  Dehydration should present part of it because his      BUN increased.  This patient already received 2 liters of fluid and      is currently on normal saline at 100 mL an hour.  This already      improved his creatinine from 2.88 to 2.4.  I will give him another      liter of normal saline.  Then I will increase his normal saline to      150 mL an hour.  I will repeat his basic metabolic panel in the      morning.  I will send for urinalysis.  I will get urine sodium,      urine potassium, urine protein, and urine creatinine.  I will hold      his lisinopril and I will hold his metoprolol for now.  I will also      get bilateral kidney ultrasound to rule out obstructive uropathy.      I think with those parameters his kidney function will improve.      Foley catheter already inserted and we will monitor his input and      output every four hours.  If his kidney deteriorates after those      intervention, then I  will recommend nephrology evaluation.  2. Thrombocytopenia.  I will watch his platelets very closely.  There      is no evidence of  bleeding at this time.  3. Mild anemia.  We will repeat his hemoglobin tomorrow and if his      hemoglobin drops then we will consider a transfusion.  4. Hypertension.  I will hold lisinopril which will worsen his kidney      function.  I will continue the rest of his medications.  I will      follow his blood pressure.  5. Diabetes mellitus.  I will hold his Metformin and I will continue      the rest of his medications.  6. Degenerative joint disease status post total knee replacement and      management will be per orthopedics.   Otherwise the patient's condition remains stable.  Thank you so much for  this case.  Total consultation time is one hour.      Michelene Gardener, MD  Electronically Signed     NAE/MEDQ  D:  09/15/2007  T:  09/15/2007  Job:  [id number]

## 2010-09-15 NOTE — Op Note (Signed)
NAME:  Lance [name], Lance [name] NO.:  [id number]   MEDICAL RECORD NO.:  [id number]          PATIENT TYPE:  OIB   LOCATION:  1424                         FACILITY:  [facility]   PHYSICIAN:  Maretta Bees. Vonita Moss, M.D.DATE OF BIRTH:  Aug 25, 1945   DATE OF PROCEDURE:  01/10/2008  DATE OF DISCHARGE:                               OPERATIVE REPORT   PREOPERATIVE DIAGNOSIS:  Bladder neck contracture.   POSTOPERATIVE DIAGNOSIS:  Bladder neck contracture.   PROCEDURE:  Transurethral resection of prostate (Gyrus).   SURGEON:  Maretta Bees. Vonita Moss, M.D.   ANESTHESIA:  Spinal.   INDICATIONS:  This 65 year old gentleman has had significant bladder  outlet obstructive symptoms and he has been on Flomax b.i.d. and still  has obstructive uropathy.  His prostate is not large enough to go on  Avodart or finasteride.  He was counseled about TUR of prostate and also  about the gyrus procedure which should be more hemostatic and cut down  on the risk of postoperative bleeding, especially since he is on chronic  Plavix therapy and he should get back on anticoagulation ASAP.   PROCEDURE:  The patient brought to the operating room, placed in the  lithotomy position.  External genitalia were prepped and draped in usual  fashion.  He was sounded from 19 to 19 [place].  A 28-French resectoscope  sheath was inserted.  The bladder had no intravesical lesions and no  stones or tumors.  He had mild trabeculation.  The prostate was short  with the lateral lobe hypertrophy.  The gyrus button was utilized to  vaporize tissue at the bladder neck at 6 o'clock and all along the floor  to the verumontanum.  The lateral lobes were then vaporized and there  was some small amount of anterior lobe tissue.  Hemostasis was obtained  by some spot coagulation with the gyrus button.  At this point he was  felt to have a well excavated prostatic fossa.  The ureteral orifices  were intact.  There was no bleeding and good  hemostasis, essentially no  blood loss.  There were no specimens.  As I removed the scope, the  external sphincter was seen to be intact. A 24-French 30 mL Foley was  inserted with clear irrigation returning.  Catheter was placed on  traction and he was taken to the recovery room in good condition having  tolerated the procedure well.      Maretta Bees. Vonita Moss, M.D.  Electronically Signed     LJP/MEDQ  D:  01/10/2008  T:  01/11/2008  Job:  [id number]

## 2010-09-15 NOTE — Op Note (Signed)
NAME:  Lance [name], Lance [name] NO.:  [id number]   MEDICAL RECORD NO.:  [id number]          PATIENT TYPE:  INP   LOCATION:  0002                         FACILITY:  [facility]   PHYSICIAN:  Jene Every, M.D.    DATE OF BIRTH:  08-Jan-1946   DATE OF PROCEDURE:  09/13/2007  DATE OF DISCHARGE:                               OPERATIVE REPORT   PREOPERATIVE DIAGNOSIS:  Degenerative joint disease, left knee.   POSTOPERATIVE DIAGNOSIS:  Degenerative joint disease, left knee.   PROCEDURE PERFORMED:  Left total knee arthroplasty.   ANESTHESIA:  Spinal.   ASSISTANT:  Roma Schanz.   COMPONENTS:  DePuy 5 tibia, 5 femur, 41 patella, 12.5 mm insert.   BRIEF HISTORY AND INDICATIONS:  A 65 year old with end-stage  osteoarthrosis of the knee indicated for replacement of degenerated  joint.  Risks and benefits discussed including bleeding, infection,  damage to vascular structures, suboptimal range of motion, DVT, PE,  anesthetic complications, need for revision, hardware failure, etc.   TECHNIQUE:  With the patient in supine position after induction of  adequate general anesthesia, vancomycin and Kefzol, left lower extremity  was prepped and draped and exsanguinated in the usual sterile fashion.  Thigh tourniquet inflated to 325 mmHg.  A midline incision was made over  the patella and median parapatellar arthrotomy was performed.  We  subperiosteally elevated off the medial tibial the meniscal tibial  ligament.  The medial lateral menisci and ACL were excised.  Tricompartmental osteoarthrosis was noted.  Osteophytes removed from the  condyle and the patella with a rongeur.  Step drill utilized to enter  the femur.  Irrigated and 5 degrees left placed, secured , pinned, and  10 mm cut off the distal femur was performed.  Next the femur was sized  to a 5 off the anterior cortex, pinned in external rotation.  The distal  femoral cutting jig was then applied.  Anterior, posterior and  chamfer  cuts were then performed with the soft tissue well protected  posteriorly.  Next, attention was turned towards the tibia.  Subluxed  the tibia.  External alignment guide was utilized, bisecting the ankle  anterior to the tibia, medial to the tibial tubercle.  We utilized 10  slotted off the high side which was laterally.  This was then pinned.  Oscillating saw utilized to perform a tibial cut.  We first used 10.  Following this we performed trials and it appeared to be tight in  extension.  We took 2 additional millimeters off the tibia.  Then  flexion/extension was equivalent.  It was not tight in extension.  Patella was then measured to a 25.  Sized to a large.  11 was planed off  the patella.  We had 13 residual remaining.  Peg holes were drilled  medializing the patellar component.  Next the knee was flexed.  Tibia  was subluxed.  We prepared the tibia.  It measured to a 5 in the  appropriate rotation, maximizing coverage medial to the tibial tubercle  without overhang.  It was then pinned.  A central drill was then  utilized and then the punch guide was then applied.  The trial peg was  placed. Then following this, we performed a box cut utilizing the box  cut jig.  The soft tissues were protected posteriorly.  Trial 5 femur  that was utilized with a 10 insert.  Good flexion, good extension,  slight mobility in full flexion.  With good tracking of the patella, all  trials were removed.  Wound copiously irrigated with pulsatile lavage.  Knee was flexed, dried and subluxed.  The cement was mixed in the usual  fashion, injected into the proximal tibia.  A 5 tray was impacted into  place, redundant cement removed. A 5 femur impacted, redundant cement  removed.  The 10 insert brought into full extension, axial load applied  until curing of the cement.  Then the cement removed.  We clamped the 41  patella on as well.  Bone wax was placed over in the cancellous surfaces  remaining.   After curing of the cement, had some slight looseness in  flexion and extension.  We then tried a 12.5 which found to have full  extension, full flexion, no anterior drawer, no instability to varus or  valgus stressing, 0 to 30 degrees.  We selected that.  We then removed  it and removed any intervening and redundant cement.  Wound copiously  irrigated.  Inspected posteriorly, cauterized the geniculate vessels.  Placed a 12.5 insert full extension, full flexion with no instability to  varus-valgus stress at 0 to 30 degrees, negative anterior drawer.  Next  Hemovac was placed and brought out through a lateral stab wound in the  skin.  Repaired the patellar arthrotomy with #1 Vicryl interrupted  figure-of-eight sutures.  Subcutaneous tissue reapproximated with 2-0  Vicryl simple sutures.  The skin was reapproximated with staples.  The  wound was dressed sterilely.  Placed Marcaine with epinephrine into the  joint.  Tourniquet was deflated with adequate vascularization of the  lower extremity appreciated.  Prior to that, flexion against gravity to  90, full extension, no instability, no anterior drawer. good tracking of  the patella was noted.   The patient was placed in a knee immobilizer, extubated without  difficulty and transported to recovery in satisfactory condition.   The patient tolerated the procedure well with no complications.   TOURNIQUET TIME:  1 hour and 15 minutes.      Jene Every, M.D.  Electronically Signed     JB/MEDQ  D:  09/13/2007  T:  09/13/2007  Job:  [id number]

## 2010-09-15 NOTE — Letter (Signed)
November 27, 2007    Lance A. Gerda Diss, MD  [address], [address]  [place] [address]   RE:  Lance [name], Lance [name]  MRN:  [id number]  /  DOB:  18-Apr-1946   Dear Lorin Picket,   [name] returns to the office following a recent admission to [facility] with chest discomfort.  Myocardial infarction was ruled out.  He  describes a number of other symptoms including tremor, sometimes to a  debilitating level.  He has fuzzy feeling and has had some visual  disturbance.  He does not have true lightheadedness and certainly he has  had no syncope.  His left knee is recovering well from his total knee  arthroplasty.   MEDICATIONS:  Unchanged from his last visit and extensive.  Of note, is  Clomid.  He takes a very small dose for what sounds like SIADH or some  endocrine issue.  This is supposed to improve urinary frequency, but he  continues to have debilitating degree including multiple episodes of  nocturia.   PHYSICAL EXAMINATION:  GENERAL:  Pleasant gentleman, who is somewhat  vaguer than usual, but in no acute distress.  VITAL SIGNS:  The weight is 225, 21 pounds less than in May.  Blood  pressure 140/90 lying, falling to 120/80 standing.  Heart rate 80 and  regular, respirations 14.  NECK:  No jugular venous distention; no carotid bruits.  LUNGS:  Clear.  CARDIAC:  Normal first and second heart sounds; fourth heart sound  present.  ABDOMEN:  Soft and nontender; no organomegaly.  EXTREMITIES:  Left knee incision is healing well; trace edema.  NEUROLOGIC:  Normal cranial nerves; normal strength and tone; normal  reflexes; normal gait and station.   IMPRESSION:  Mr. Hamza continues to have vague symptoms for which no  clear etiology has been identified.  I cautioned him regarding getting  up rapidly and avoidance of orthostatic symptoms.  He describes insomnia  and nervousness without a life event to cause anxiety.  We will give  him some low dose of Xanax for the day time at a dose of 0.25 mg  b.i.d.  and reassess this nice gentleman in 3 weeks.  You may wish to determine  who prescribed Clomid for him and decide whether or not continuation of  that medication is worthwhile.  Records show that he is on fexofenadine.  If he is on Allegra-D, that should be discontinued as well.  A CBC and  TSH level are pending.    Sincerely,      [name], MD, [facility]  Electronically Signed    RMR/MedQ  DD: 11/27/2007  DT: 11/28/2007  Job #: [id number]

## 2010-09-15 NOTE — Op Note (Signed)
NAME:  Lance [name], Lance [name] NO.:  [id number]   MEDICAL RECORD NO.:  [id number]          PATIENT TYPE:  AMB   LOCATION:  DAY                          FACILITY:  [facility]   PHYSICIAN:  Jene Every, M.D.    DATE OF BIRTH:  1945/11/05   DATE OF PROCEDURE:  04/07/2007  DATE OF DISCHARGE:                               OPERATIVE REPORT   PREOPERATIVE DIAGNOSIS:  Degenerative joint disease, left knee.   POSTOPERATIVE DIAGNOSIS:  Degenerative joint disease, left knee, grade 3  chondromalacia medial compartment with grade 4 changes of the medial  femoral condyle, femoral sulcus, patella.  Degenerative tear of medial  meniscus.   PROCEDURE PERFORMED:  Left knee arthroscopy, lavage, chondroplasty  femoral condyle, tibial plateau, partial medial meniscectomy,  chondroplasty patellofemoral joint.   ANESTHESIA:  General.  No assistant.   BRIEF HISTORY AND INDICATIONS:  A 65 year old, knee pain refractory,  diabetic, despite conservative treatment.  He had medial joint space  narrowing and degenerative changes and fusion.  He was indicated for  debridement and lavage.  Risks and benefits discussed including  bleeding, infection, no change in symptoms, worsening symptoms, need for  repeat debridement in the future, anesthetic complications, etc.   PROCEDURE NOTE:  The patient in supine position.  After introduction of  adequate anesthesia and 2 grams of Kefzol, left lower extremity was  prepped and draped in usual sterile fashion.  A lateral parapatellar  portal and superior medial parapatellar portal was fashioned with a #11  blade.  Ingress cannula atraumatically placed.  Irrigant was utilized to  insufflate the joint.  Under direct visualization, medial parapatellar  portal was fashioned with a #11 blade after localization with an 18  gauge needle, sparing the medial meniscus.  Noted was extensive grade 3,  grade 4 change of the medial compartment.  Large cratering of the  femoral cartilaginous cap was noted.  Chondral flap tear and loose  bodies.  Collene Mares was introduced and utilized to perform chondroplasty of  the femoral condyle, tibial plateau, shaving the degenerated tear of the  meniscus.  Remnant was then stable to probe palpation, degenerative  changes of the ACL and PCL.  Moderate degenerative changes of the  lateral compartment was noted.  Severe patellofemoral degenerative  changes were noted as well, particularly on the sulcus.  Chondroplasty  was performed here as well.  Gutters were unremarkable.  Attention was  turned back towards the medial compartment.  Extensive degenerative  changes of grade 4 noted over the femoral condyle.  After all loose  cartilaginous debris was removed, the knee copiously lavaged, all  instrumentation was removed.  Portals were closed with 4-0 nylon simple  suture.  Quarter percent Marcaine with epinephrine was infiltrated in  the joint.  Wound was dressed sterilely.  Awakened without difficulty  and transported to recovery in satisfactory addition.  The patient  tolerated the procedure well with no complications.     Jene Every, M.D.  Electronically Signed    JB/MEDQ  D:  04/07/2007  T:  04/08/2007  Job:  [id number]

## 2010-09-15 NOTE — Procedures (Signed)
NAME:  Lance [name], Lance [name] NO.:  [id number]   MEDICAL RECORD NO.:  [id number]          PATIENT TYPE:  OUT   LOCATION:  SLEEP LAB                     FACILITY:  [facility]   PHYSICIAN:  Barbaraann Share, MD,FCCPDATE OF BIRTH:  August 22, 1945   DATE OF STUDY:  02/06/2007                            NOCTURNAL POLYSOMNOGRAM   REFERRING PHYSICIAN:  Scott A. Gerda Diss, MD   LOCATION:  Sleep lab.   INDICATION FOR STUDY:  Hypersomnia with sleep apnea.   EPWORTH SLEEPINESS SCORE:  22   SLEEP ARCHITECTURE:  The patient had a total sleep time of 335 minutes,  with no slow-wave sleep and only 31 minutes of REM.  Sleep onset latency  was normal, and the REM onset was very prolonged at 240 minutes.  Sleep  efficiency was decreased at 89%.   RESPIRATORY DATA:  The patient was found to have 14 obstructive  hypopneas and 3 obstructive apneas, for a apnea/hypopnea index of only 3  events per hour.  The patient was noted to have very loud snoring, and  most of the events occurred in the supine position.  The patient did not  need split night criteria, secondary to the small numbers of events.   OXYGEN DATA:  There was O2 saturation as low as 86%, with the patient's  obstructive events.   CARDIAC DATA:  Very frequent PVCs were noted throughout.   MOVEMENT-PARASOMNIA:  The patient was found to have small numbers of leg  jerks with no significant sleep disruption.   IMPRESSIONS-RECOMMENDATION:  1. Small numbers of obstructive events which do not meet the      apnea/hypopnea index criteria for the obstructive sleep apnea      syndrome.  There was O2 desaturation as low as 86%, with the      obstructive events that were present.  It should be noted, the      patient had very little slow-wave sleep and REM, and therefore his      degree of      sleep apnea may be underestimated.  Clinical correlation is      suggested.  2. Very frequent PVCs noted throughout, but no malignant cardiac  arrhythmia noted.      Barbaraann Share, MD,FCCP  Diplomate, American Board of Sleep  Medicine  Electronically Signed     [facility]/MEDQ  D:  02/15/2007 15:50:36  T:  02/16/2007 14:06:21  Job:  [id number]

## 2010-09-15 NOTE — Discharge Summary (Signed)
NAME:  Lance [name], MIRON NO.:  [id number]   MEDICAL RECORD NO.:  [id number]          PATIENT TYPE:  AMB   LOCATION:  [facility]                         FACILITY:  [facility]   PHYSICIAN:  [name], MD, FACCDATE OF BIRTH:  [birth year]   DATE OF ADMISSION:  DATE OF DISCHARGE:                               DISCHARGE SUMMARY   PRIMARY CARDIOLOGIST:  [name], MD, [facility]   PRIMARY CARE:  Scott A. Luking, MD   DISCHARGE DIAGNOSIS:  Chest pain with negative cardiac workup.   PAST MEDICAL HISTORY:  1. Coronary artery disease with history of sudden cardiac arrest      status post 6 myocardial infarctions status post previous      catheterizations with interventions.  2. Ischemic cardiomyopathy status post [name] ICD implant secondary      to VFib arrest.  3. Diabetes.  4. Hypertension.  5. Hypercholesteremia.  6. Chronic renal insufficiency.  7. Thrombocythemia.  8. Degenerative joint disease.  9. GERD.   HOSPITAL COURSE:  Lance [name] is a 65 year old male with known history of  coronary artery disease, who presented on day of admission complaining  of chest discomfort relieved with nitroglycerin.  The patient with  history of noncompliance with medications states he has not taken his  Norvasc and lisinopril.  The patient was admitted for observation.  Cardiac enzymes negative.  EKG without acute changes.  Dr. Dietrich Pates in  to see the patient on day of discharge.  Patient is being discharged to  resume Norvasc at home.  He has an appointment for followup for further  discussion of symptoms.   AT TIME OF DISCHARGE MEDICATIONS:  1. Norvasc 5 mg daily.  2. Citalopram 40 mg daily.  3. Clomid 50 mg one-fourth a tablet on Mondays, Wednesdays, and      Fridays.  4. Xanax as previously prescribed.  5. Insulin as previously prescribed.  6. Omeprazole 40 mg daily.  7. Flomax 0.4 mg daily.  8. Lipitor 80 daily.  9. Metformin 1000 mg b.i.d.  10.Lisinopril 5 mg  daily.  11.Metoprolol ER 100 mg daily.  12.Fish oil, fexofenadine, and Percocet as previously prescribed.   FOLLOWUP:  Follow up with Dr. Dietrich Pates.  Appointment already scheduled  for November 27, 2007.   DURATION OF DISCHARGE ENCOUNTER:  Less than 30 minutes.      Dorian Pod, ACNP      [name], MD, [facility]  Electronically Signed    MB/MEDQ  D:  12/20/2007  T:  12/21/2007  Job:  [id number]

## 2010-09-15 NOTE — Discharge Summary (Signed)
NAME:  Lance [name], Lance [name] NO.:  [id number]   MEDICAL RECORD NO.:  [id number]          PATIENT TYPE:  INP   LOCATION:  1617                         FACILITY:  [facility]   PHYSICIAN:  Jene Every, M.D.    DATE OF BIRTH:  [birth year]   DATE OF ADMISSION:  09/13/2007  DATE OF DISCHARGE:  09/17/2007                               DISCHARGE SUMMARY   ADMISSION DIAGNOSES:  1. Degenerative joint disease left knee  2. Insulin dependent diabetes.  3. Hypertension.  4. Coronary artery disease.  5. MI x6.  6. Gastroesophageal reflux disease.  7. Defibrillator and pacemaker in place.  8. Hypercholesteremia.   DISCHARGE DIAGNOSES:  1. Degenerative joint disease left knee  2. Insulin dependent diabetes.  3. Hypertension.  4. Coronary artery disease.  5. MI x6.  6. Gastroesophageal reflux disease.  7. Defibrillator and pacemaker in place.  8. Hypercholesteremia.  9. Status post left total knee arthroplasty, resolved.  10.Acute on chronic renal failure, resolved.  11.Thrombocytopenia.  12.Stable postoperative anemia.   HISTORY OF PRESENT ILLNESS:  Lance [name] is a pleasant 61-year gentleman  who is well-known to our practice.  He has longstanding history of  bilateral knee pain, left greater than right.  He has undergone  conservative treatment including cortisone injections as well as a knee  arthroscopy with only short-term relief of symptoms.  He is noted to  have progression of his pain, worse fairly disabling to him.  At this  point, x-rays do reveal tricompartmental osteoarthritis.  It was felt he  would benefit from a total knee arthroplasty.  The risks and benefits of  this were discussed with the patient.  Medical clearance was obtained.  He did elect to proceed.   CONSULTANT:  PT/OT, Care Management, [facility].   PROCEDURE:  The patient was taken to the OR on Sep 13, 2007, underwent  left total knee arthroplasty.  Surgeon Dr. Jene Every, assistant  Lance Schanz, PA-C.   ANESTHESIA:  General.   COMPLICATIONS:  None   LABORATORY:  Preoperative CBC shows white cell count 4.6, hemoglobin  13.5, hematocrit 40.3.  This is followed throughout the hospital course.  White cell count remained within normal range.  Hemoglobin did remain  fairly stable at time of discharge 9.4, hematocrit 27.0.  There was an  issue with his platelet count at admission, platelets were 124.  During  hospital course they did drop to a level 83, but improved at time of  discharge to 113.  Coagulation studies done preoperatively showed PT of  13.0, INR of 1.0, PTT of 26.  The patient was placed on Coumadin for DVT  prophylaxis.  At time of discharge, he was therapeutic with INR 1.7.  Routine chemistries done preoperatively show sodium 137, potassium 3.8,  glucose 191, BUN 9, creatinine of 0.88.  These were followed throughout  the hospital course.  Sodium did drop below 134.  However, at time of  discharge, normalized to 138.  Potassium remained stable at time of  discharge 3.7, glucose fluctuated with the highest being 303 at time of  discharge was  176.  Postop day #1, the patient's renal function did  spike with a creatinine of 2.88, but following several boluses, it did  resolve at the time of discharge.  BUN was 10 with a creatinine of 1.02.  GFR was diminished postop day #1 at 23.  At time of discharge, improved  to 51.  Preoperative liver function tests show an ALV of 3.3, AST of 42,  ALT of 49, ALP 54.  Preoperative urinalysis showed greater than 1000  mg/dL of glucose, rare epithelials, 0-2 WBCs noted.  Repeat urinalysis  showed 100 mg/dL glucose, large amount of hemoglobin in the urine, small  amount of leukocyte esterase, 0-2 WBCs as well as 0-2 RBCs noted on  repeat urinalysis.  Urine culture showed no growth.  Blood type is O+.  Preoperative EKG showed normal sinus rhythm, included right bundle  branch block.  This was compared to previous EKGs which  showed no  change.  Chest x-ray showed no active disease.  X-rays of the knee  showed tricompartmental osteoarthritis.  Renal ultrasound was obtained  following an episode of increased creatinine, showed large left upper  pole benign appearing renal cyst.   HOSPITAL COURSE:  The patient was admitted, taken to the OR, underwent  the above stated procedure without difficulty.  One Hemovac drain was  placed intraoperatively.  He was then transferred to the PACU and then  to the orthopedic for continued postop.  Placed on PCA analgesics  postoperatively.  On postop day #1, throughout the night, the patient  did have decreased urine output.  Putting out 50 mL of urine for the  past 8 hours, PA on call was advised.  No orders were initiated.  He was  then evaluated later that morning with continued decreased urine output  and evaluation of his renal function and creatinine bumped up to 2.88,  BUN remained stable at 20, hemoglobin was stable as well.  Vital signs  were stable.  He did have a CBG of 310.  Hemovac was draining 30 mL.  Motor and neurovascular function was intact.  Incision was clean and  dry.  We did adjust patient's medication.  We did stop his ACE  inhibitors.  We also discontinued metformin.  We did consult [facility]  for evaluation of acute on chronic renal failure.  Did give him on  multiple boluses in the interim.  PT was initiated.  Coumadin was  continued as per pharmacy.  We did place him on a sliding scale for  glucose control.  [facility] did evaluate the patient for his renal  issues.  Renal ultrasound was ordered  By the time they did see him, he  had significant improvement in his creatinine as well as his urine  output.  Postoperative day #2, creatinine had stabilized to 1.4.  He  did, however, develop some thrombocytopenia with platelet count of 8.5,  hemoglobin remained stable.  There was no evidence of bleeding at this  time.  The patient was doing well from  orthopedic standpoint as far as  his therapy.  Dressing was changed.  Incision was clean, dry and intact.  There was minimal swelling.  Over the course of the next few days, the  patient continued to improve both orthopedically as well as medically.  Labs began to stabilize.  We did slowly restart his medications without  significant incident.  Coumadin was continued.  INR became therapeutic.  Postop day #4, the patient was released per Medicine and felt medically  stable to be discharged home.  Orthopedically, he was stable as well.  It was felt at this point the patient was improved and could be  discharged home with home health PT and OT.   DISPOSITION:  The patient cleared for discharge to home.   FOLLOWUP:  He is to follow up with Dr. Shelle Iron in approximately 10-14 days  for x-ray and suture removal.  Follow up with his medical physician to  follow his thrombocytopenia as well as his renal function.   DISCHARGE INSTRUCTIONS:  1. Diet:  As tolerated.  2. Wound care:  He is to change this daily.  Keep this area clean and      dry.  3. Activity:  He is to walk as tolerated.  Elevate six times a day.      Continue with knee immobilizer until he can straight leg raise.   MEDICATIONS AT DISCHARGE:  Include all home medications, vitamin C 500  mg, Percocet, Robaxin and Coumadin.   CONDITION ON DISCHARGE:  Stable and improved.   FINAL DIAGNOSIS:  Status post left total knee arthroplasty.      Lance [name], P.A.      Jene Every, M.D.  Electronically Signed    CS/MEDQ  D:  10/26/2007  T:  10/26/2007  Job:  [id number]

## 2010-09-15 NOTE — Assessment & Plan Note (Signed)
[facility]                       [place] CARDIOLOGY OFFICE NOTE   NAME:Hemp, TRE SANKER                       MRN:          [id number]  DATE:05/23/2008                            DOB:          April 29, 1946    CARDIOLOGIST:  [name], MD, [facility]   PRIMARY CARE PHYSICIAN:  Scott A. Gerda Diss, MD   REASON FOR VISIT:  TIA.   HISTORY OF PRESENT ILLNESS:  Mr. Antonini is a 65 year old male with a  history of coronary artery disease status post multiple percutaneous  interventions in the past who suffered sudden cardiac death in Oct 21, 2003 in [place] and underwent implantation of an AICD.  Follow up  assessment of his LV function has demonstrated that his ejection  fraction is normal.  In July 2005, his EF was 55-65%.  He had a nuclear  study performed in May 2009 that demonstrated an EF of 52%.  At that  time, there was no evidence of ischemia.  His last heart catheterization  was done in March 2005.  At that time, he did have a Cypher drug-eluting  stent placed to the obtuse marginal #2.  He had an ostial left main  stenosis with 30% stenosis, patent stent in the proximal/mid LAD and a  40% mid stenosis.  His first diagonal was totally occluded.  His obtuse  marginal #1 was totally occluded as well.  He had a 30% stenosis in the  obtuse marginal 3.  He had a 30% mid in-stent restenosis in the RCA and  60-70% ostial PDA stenosis.  He has been plagued with symptoms of  orthostatic hypotension in the past.  This is associated with  significant dizziness/lightheadedness/near syncope.  He recently had  symptoms concerning for transient ischemic attack and is referred today  for further evaluation.   Approximately 2 days ago, the patient developed recurrent dizziness that  he usually gets with his orthostatic hypotension.  He then developed a  visual field deficit on the right.  He denies any visual field problems  on the left.  His visual field  deficit was on the temporal side of his  visual field.  He also developed some aphasic symptoms.  His symptoms  lasted a few hours and then subsided on their own.  He saw his primary  care physician and was sent over to the emergency room and had a head CT  that was negative for bleeding or other acute intracranial process.  Carotid Dopplers showed minimal bilateral carotid intimal thickening  with no significant proximal ICA stenosis on either side.  [name]  and Dr. Dietrich Pates spoke over the telephone, and he was referred today for  further evaluation.  He was placed on Aggrenox.  Apparently, the patient  had been taken off Plavix some time ago.  On further questioning, it  sounds as though he misunderstood when he was placed on doxazosin and  thought he was supposed to stop the Plavix.   The patient has a long history of chest pressure.  This has been  occurring for years.  He notes it at rest as well  as with exertion.  It  lasts 4-5 hours and goes away on its own.  He notes associated shortness  of breath, but no nausea or diaphoresis and no syncope.  He sleeps on 2  pillows.  He has done this for many years.  He denies paroxysmal  nocturnal dyspnea or edema.  He describes dyspnea with exertion and  relates NYHA class III symptoms.  There has been no significant change  over the last several years.   MEDICATIONS:  Humalog insulin, Amlodipine 5 mg daily, Citalopram 40 mg  daily, Fish oil 1 g daily, Metformin 1 g b.i.d., Clomid 50 mg one-  quarter tablet Monday, Wednesday, Friday,  Lipitor 80 mg daily, Xanax 0.25 mg daily, Omeprazole 40 mg daily,  Doxazosin 4 mg daily, Aggrenox b.i.d., Xanax 1 mg one to two daily  p.r.n.,  Hydrocodone 7.5/750 mg p.r.n.   ALLERGIES:  PENICILLIN.   SOCIAL HISTORY:  The patient denies current tobacco or alcohol abuse.   FAMILY HISTORY:  Significant for CAD.   REVIEW OF SYSTEMS:  Please see HPI.  Denies any fevers, chills, cough,  melena,  hematochezia, hematuria, dysuria.  Rest of the review of systems  are negative.   PHYSICAL EXAMINATION:  GENERAL:  He is a well-nourished, well-developed  male in no distress.  VITAL SIGNS:  Blood pressure is 141/80, pulse 97, weight 238 pounds.  ORTHOSTATIC VITAL SIGNS:  His blood pressure on the right is 130/82 with  a pulse of 88, 117/61 with a pulse of 89, lying.  With standing, his  blood pressure is 146/90 with a heart rate of 100 on the right and  107/66 with a pulse of 97 on the left.  HEENT:  Normal.  NECK:  Without JVD.  CARDIAC:  Normal S1 and S2.  Regular rate and rhythm.  No murmur.  LUNGS:  Clear to auscultation bilaterally.  ABDOMEN:  Soft, nontender.  No organomegaly.  EXTREMITIES:  Without edema.  NEUROLOGIC:  He is alert and oriented x3.  Cranial nerves II through XII  are grossly intact.  SKIN:  Warm and dry.   Electrocardiogram demonstrates normal sinus rhythm with a heart rate of  83, normal axis, no acute changes.   ASSESSMENT AND PLAN:  1. Recent symptoms of dizziness and visual field deficit and aphasia.      He certainly describes symptoms that are consistent with a      transient ischemic attack.  He was also interviewed and examined by      Dr. Dietrich Pates today.  Upon further review with Dr. Dietrich Pates, we have      decided to have the patient finish out his current prescription of      Aggrenox.  After he is finished with that, he will go back on      Plavix 75 mg a day and aspirin 81 mg a day.  We will also set the      patient up for a transthoracic echocardiogram.  If this is      abnormal, he may require transesophageal echocardiography.  The      patient cannot have an MRI secondary to his defibrillator.  He may      require neurology referral.  I discussed this with Dr. Lilyan Punt      over the telephone.  He will discuss this further with the patient      and his wife in the next several days.  The patient will be seen  back by Dr. Dietrich Pates in  the next 2-4 weeks or after he sees a      neurologist, should that referral will be made.  2. Coronary artery disease, as outlined above.  He has had some chest      discomfort that has been present for quite a long time without      significant change as noted.  He had a Myoview study done in May      2009 that demonstrated no ischemia.  No further workup is warranted      at this time.  3. History of sudden cardiac death status post automatic implantable      cardioverter-defibrillator implantation.  He should follow up with      Dr. Ladona Ridgel as outlined.  4. Hypertension with orthostatic hypotension.  He does have evidence      of vascular disease on the left based upon blood pressure      discrepancy from right to left.  I discussed this with Dr.      Dietrich Pates.  At this point in time, no further workup is planned.  No      medication changes will be made today.  5. Dyslipidemia.  He continues on Lipitor.  6. Diabetes mellitus.  He will continue follow up with his primary      physician.   DISPOSITION:  Follow up with Dr. Dietrich Pates in 1 month.  If the patient  does get referred to a neurologist, Dr. Dietrich Pates wants to see him back  soon after that appointment.      Tereso Newcomer, PA-C  Electronically Signed      [name], MD, [facility]  Electronically Signed   SW/MedQ  DD: 05/23/2008  DT: 05/24/2008  Job #: [id number]   cc:   Lorin Picket A. Gerda Diss, MD

## 2010-09-15 NOTE — Letter (Signed)
December 19, 2007    Scott A. Gerda Diss, MD  [address]  [place], [place] [address]   RE:  Lance [name], Lance [name]  MRN:  [id number]  /  DOB:  1945/06/16   Dear Lorin Picket,   Mr. Meador returns to the office for continued assessment and treatment  of ischemic heart disease with a history of aborted sudden cardiac death  requiring an AICD.  Since the last visit, he has done well.  He believes  that Xanax during the day is helping to smooth out the roughages.  He  has no complaints now other than urologic symptoms.  He has urinary  frequency and voids in small amounts despite treatment with Flomax.  TURP is planned and medical clearance is requested.   Medications are extensive and unchanged from his last visit.   PHYSICAL EXAMINATION:  GENERAL:  On exam, pleasant gentleman in no acute  distress.  VITAL SIGNS:  The weight is 234, 9 pounds more than in July 2009.  Blood  pressure 110/60, heart rate 72 and regular, respirations 14.  NECK:  No jugular venous distention; no carotid bruits.  LUNGS:  Clear.  CARDIAC:  Normal first and second heart sounds; fourth heart sound  present.  Normal PMI.  ABDOMEN:  Soft and nontender; no masses; no organomegaly.  EXTREMITIES:  Trace edema.   CBC in July was normal as was TSH.  A1c in July was 8.3.  Chemistry  profile was normal.   IMPRESSION:  Mr. Dec certainly has increased surgical risk, but the  planned TURP is necessary and is at relatively low risk.  His beta-  blocker should be continued perioperatively.  His clopidogrel (Plavix)  will need to be discontinued approximately 1 week prior to his  operation.  I would continue his aspirin if possible.  The dose will be  decreased to 81 mg daily.   I received an indirect message through the patient that Dr. Everardo All  would like metoprolol discontinued.  This apparently relates to his  history of erectile dysfunction.  I would prefer to see him treated with  a phosphodiesterase inhibitor rather than  to withhold beta-blockade.    Sincerely,      [name], MD, [facility]  Electronically Signed    RMR/MedQ  DD: 12/19/2007  DT: 12/20/2007  Job #: [id number]   CC:   Maretta Bees. Vonita Moss, M.D.  Sean A. Everardo All, MD

## 2010-09-15 NOTE — Op Note (Signed)
NAME:  CHARLI, LIBERATORE NO.:  [id number]   MEDICAL RECORD NO.:  [id number]          PATIENT TYPE:  AMB   LOCATION:  DAY                          FACILITY:  [facility]   PHYSICIAN:  Leonides Grills, M.D.     DATE OF BIRTH:  January 24, 1946   DATE OF PROCEDURE:  09/07/2006  DATE OF DISCHARGE:                               OPERATIVE REPORT   PREOPERATIVE DIAGNOSES:  1. Left medial talar dome osteochondral lesion.  2. Left anterior ankle impingement secondary to anterior distal tibial      as well as fibular osteophytes.   POSTOPERATIVE DIAGNOSES:  1. Left medial talar dome osteochondral lesion.  2. Left anterior ankle impingement secondary to anterior distal tibial      as well as fibular osteophytes.   OPERATION:  Left ankle arthroscopy with extensive debridement to his  left, debridement and drilling medial talar dome osteochondral lesion.   ANESTHESIA:  Spinal.   SURGEON:  Leonides Grills, M.D.   ASSISTANT:  Evlyn Kanner, P.A.-C   ESTIMATED BLOOD LOSS:  Minimal.   TOURNIQUET TIME:  None.   COMPLICATIONS:  None.   DISPOSITION:  Stable to PR.   INDICATIONS:  This is a 65 year old male who has had longstanding  anterior ankle pain with swallowing that is interfering with his life to  the point where he could not do what he wants to do.  He was consented  for the above procedure.  All risks which include infection, nerve or  vessel injury, persistent pain, worst pain, prolonged recovery,  stiffness, arthritis, sinus formation as well as synovial cyst formation  were all explained.  Questions were encouraged and answered.   DESCRIPTION OF PROCEDURE:  The patient was brought to the operating room  and placed in supine position after adequate spinal anesthesia was  administered as well as Ancef 1 gram IV piggyback.  The patient was then  placed in a sloppy lateral position with operative side up on a beanbag.  All bony prominence were well padded.  Left lower extremity  was then  prepped and draped in a sterile manner.  No tourniquet was used and  landmarks include anterior tibialis tendon as well as peroneus tertius  were then mapped out, superficial peroneal nerve could not be seen  initially.  A spinal needle was then placed into the ankle and 20 mL of  normal saline was placed into the ankle.  Weston Brass and spread technique was  then utilized to create the anteromedial portal medial to the anterior  tibialis tendon.  Blunt tip trocar with cannula followed by camera was  then placed in the ankle and under direct visualization, the  anterolateral portal was created lateral to peroneus tertius tendon.  We  visualized from inside out to verify that the superficial peroneal nerve  was not in the interval there was a tremendous amount of synovitis over  the entire anterior aspect the ankle.  This was extensively debrided  with a shaver as well as bevel.  Hemostasis was obtained.  We then  demarcated the entire anterior aspect of the ankle.  There  was two loose  bodies within the ankle as well which were quite large and both of which  were removed.  We then removed the anterior distal tibial spur with a  curved quarter inch osteotome, 3.5 mm bur, as well as a 2.9 mm shaver.  Once this was done, this cleaned out the entire anterior aspect of the  ankle and relieved of the anterior ankle impingement.  There was a  osteochondral lesion in the medial talar dome.  It was approximately 1  cm x 4 mm.  This was debrided with a House curette as well as shaver  back to normal cartilage.  Multiple drill holes were placed with the  microfracture technique.  Inflow was decreased and this bled  beautifully.  Pictures were obtained throughout the procedure.  Camera  was removed.  Wounds were closed 4-0 nylon stitch.  Sterile dressing was  applied.  Cam Walker boot was applied.  The patient was stable to the  PR.   POSTOPERATIVE COURSE:  The patient will follow-up in one week.   At that  time, we will remove the dressing as well as sutures.  He will start  therapy immediately for active/passive range of motion, exercise of the  ankle and talar joint with Thera-Band strengthening as well.  He has  remained nonweightbearing for a total of six weeks, at which point he  will weightbear as tolerated in the [employer] boot an additional three  to four weeks.  He will then transition to an ASO brace and undergo more  aggressive proprioception strengthening.  Elevation, active range of  motion of toes are encouraged. Once he is undergoing more aggressive  proprioception strength, he is to wean himself out of his ASO brace and  then proceed activities as tolerated.      Leonides Grills, M.D.  Electronically Signed     PB/MEDQ  D:  09/07/2006  T:  09/07/2006  Job:  [id number]   cc:   [name]

## 2010-09-15 NOTE — Assessment & Plan Note (Signed)
[facility]                         ELECTROPHYSIOLOGY OFFICE NOTE   NAME:Lance [name], Lance [name]                       MRN:          [id number]  DATE:07/03/2008                            DOB:          [birth year]    Mr. Nicolls returns for followup.  He is a very pleasant middle-aged male  with an ischemic cardiomyopathy and left ventricular dysfunction, status  post myocardial infarction.  He has a history of drug-eluting stent in  the past.  The patient underwent ICD implantation in 2005.  He recently  complains of problems with an episode of what is thought to be a TIA and  was seen by Dr. Dietrich Pates and also by Dr. Sandria Manly and complete evaluation  was carried out.  No obvious causes were demonstrated.  He had no  significant carotid vascular disease demonstrated.  His main complaint  today is that of difficulty bending over and standing up.  He denies  chest pain.  He denies shortness of breath.  If he exerts himself,  however, he gets quickly fatigued.   MEDICINES:  1. Humalog insulin.  2. Amlodipine 5 a day.  3. Fish oil supplement.  4. Metformin 1 g twice daily.  5. Clomid 50 mg one-quarter tab Monday, Wednesday, and Friday.  6. Lipitor 80 a day.  7. Xanax 0.25 daily.  8. Omeprazole 40 a day.  9. Doxazosin 4 mg daily.   PHYSICAL EXAMINATION:  GENERAL:  He is a pleasant middle-aged man, in no  distress, looks somewhat older than his stated age.  VITAL SIGNS:  Blood pressure today was 142/80, pulse 68 and regular,  respirations were 18, weight was 240 pounds, up 6 pounds from his visit  in August.  NECK:  No jugular distention.  LUNGS:  Clear bilaterally to auscultation.  No wheezes, rales or rhonchi  are present.  No increased work of breathing.  CARDIOVASCULAR:  Regular rate and rhythm.  Normal S1 and S2.  ABDOMEN:  Soft and nontender.  EXTREMITIES:  No edema.   IMPRESSION:  1. Ischemic cardiomyopathy.  2. Congestive heart failure.  3. Status  post implantable cardioverter-defibrillator insertion.  4. Recent transient ischemic attacks, etiology unclear.   DISCUSSION:  Mr. Lavine today is stable.  He has had no neurologic  symptoms recently.  His defibrillator is working normally.  His heart  failure is well controlled.  I asked that he watches p.o. intake and  reduce his salt intake.  We will plan to see him back for ICD followup  in 1 year.     Doylene Canning. Ladona Ridgel, MD  Electronically Signed    GWT/MedQ  DD: 07/03/2008  DT: 07/03/2008  Job #: [id number]   cc:   Lorin Picket A. Gerda Diss, MD

## 2010-09-15 NOTE — Letter (Signed)
Sep 05, 2007    Jene Every, M.D.  [address]  [place], [place] [address]   RE:  Lance, [name]  MRN:  [id number]  /  DOB:  Apr 26, 1946   Dear Dr. Shelle Iron:   Lance [name] was seen in the office today at your request prior to planned  total knee arthroplasty.  As you know, he has a complex cardiac history  including significant coronary disease and sudden cardiac death.  He has  done well in recent years with good exercise tolerance, no recurrent  arrhythmias or loss of consciousness, and no chest discomfort nor  dyspnea.  He has an AICD in place that is functioning normally.  His  most recent catheterization was performed in 2005 at which time he was  found to have total obstruction of a diagonal, a moderate stenosis in  the PDA that was not approached, and a 90% stenosis in an obtuse  marginal branch of the circumflex that was stented.  His most recent  stress test was 2 years ago at which time he had an ejection fraction of  52% and no ischemia.   PHYSICAL EXAMINATION:  GENERAL:  Pleasant, well-appearing gentleman.  VITAL SIGNS:  The weight is 246, stable.  Blood pressure 125/70, heart  rate 84 and regular, respirations 15.  NECK:  No jugular venous distention; normal carotid upstrokes without  bruits.  LUNGS:  Clear.  CARDIAC:  Normal first and second heart sounds; no third heart sound nor  murmur appreciated.  ABDOMEN:  Soft and nontender; no organomegaly.  EXTREMITIES:  1-2+ edema; distal pulses intact.   EKG:  Normal sinus rhythm; borderline left atrial abnormality; right  ventricular conduction delay; nonspecific T wave abnormality.  Compared  with a prior tracing of August 23, 2006, R wave progression is now  normal.  Q waves are slightly more flattened.   IMPRESSION:  Lance [name] is doing generally well but has had very  significant cardiac disease.  Before this moderate risk operation,  stress testing is warranted to verify the absence of ischemia and the  presence  of good left ventricular systolic function.  It will be  necessary to deactivate his device in the operating room.  This can be  arranged via the [name] representative or the catheterization  laboratory.  It will also be necessary to hold Plavix prior to his  surgery could and for a period of time thereafter, which should be  relatively safe, now 4 years following stent implantation.  I will  provide you with the final clearance once a pharmacologic stress nuclear  study has been completed.   Thank you so much for allowing me to assist in your surgical treatment  of Lance [name].    Sincerely,      [name], MD, [facility]  Electronically Signed    RMR/MedQ  DD: 09/05/2007  DT: 09/05/2007  Job #: [id number]

## 2010-09-15 NOTE — Consult Note (Signed)
[facility]                          ENDOCRINOLOGY CONSULTATION   NAME:Lance [name]                       MRN:          [id number]  DATE:12/22/2006                            DOB:          06/26/1945    REASON FOR REFERRAL:  Diabetes.   HISTORY OF PRESENT ILLNESS:  A 65 year old man who reports a 20-year  history of diabetes.  He has been on insulin for 9 years.  He states his  insulin is complicated by coronary artery disease.  He currently takes  Lantus 80 units q.h.s. as well as p.r.n. Humalog which he states totals  about 20 units a day.  He states his glucose is variable but in general  they are in the 100 early in the day and 200 later in the day.  Symptomatically, he has 3 years of numbness of his feet with some  associated pain.  He feels that his foot symptoms may be due to his  arthritic back.  He states his diet is okay but he is not able to get  any exercise due to his medical condition.   PAST MEDICAL HISTORY:  1. GERD.  2. Hypertension.  3. BPH.  4. Allergic rhinitis.  5. Depression.   MEDICATIONS:  Prevacid, Plavix, Lopressor, metformin, Zestril, Flomax,  Allegra, Celexa, Norvasc, and insulin as noted above.   SOCIAL HISTORY:  He is disabled.  He is here with his wife today.   FAMILY HISTORY:  His mother had diabetes and his brother is deceased and  he had diabetes.   REVIEW OF SYSTEMS:  He has occasional hypoglycemia in the middle of the  night.  There is no change in his chronic shortness of breath.  He  denies nausea, vomiting, and any change in his weight.   PHYSICAL EXAMINATION:  VITAL SIGNS:  Blood pressure 143/87, heart rate  65, temperature is 98, the weight is 230.  GENERAL:  Obese, no distress.  SKIN:  No rash.  Not diaphoretic.  HEENT:  No proptosis.  No periorbital swelling.  NECK:  No goiter.  CHEST:  Clear to auscultation, except for a few wheezes.  He has no  respiratory distress.  CARDIOVASCULAR:  Trace  bilateral pretibial edema.  Regular rate and  rhythm.  No murmur.  Pedal pulses are intact.  There is no bruit at the  carotid arteries.  EXTREMITIES:  Feet normal color and temperature.  There is no ulcer  present on the feet.  NEUROLOGIC:  Alert and oriented.  Does not appear anxious nor depressed  and there is no tremor.   LABORATORY STUDIES:  Forwarded by Dr. Gerda [name], hemoglobin A1c is elevated  at 9.5 on November 21, 2006.   IMPRESSION:  1. Insulin requiring type 2 diabetes.  The pattern of his glucose      suggests that he needs more Humalog and less Lantus.  2. His diabetes is complicated by coronary artery disease.  3. Chronic shortness of breath.  He may be under treatment for      congestive heart failure also.   PLAN:  1. We discussed the importance  of diet and exercise therapy as well as      the risks of diabetes.  2. Please make an appointment with Dr. Gerda [name] to evaluate shortness of      breath.  3. Decrease Lantus to 50 units q.h.s.  4. Change Humalog to a scheduled dose of 15 breakfast, 5 lunch, 15      supper.  5. Return in a week or two.     Lance A. Everardo All, MD  Electronically Signed    SAE/MedQ  DD: 12/22/2006  DT: 12/23/2006  Job #: [id number]   cc:   Lance Picket A. Lance Diss, MD  Lance Friends. Dietrich Pates, MD, [facility]

## 2010-09-18 NOTE — Procedures (Signed)
   NAME:  Lance [name], Lance [name]                          ACCOUNT NO.:  [id number]   MEDICAL RECORD NO.:  [id number]                   PATIENT TYPE:  INP   LOCATION:  6522                                 FACILITY:  [facility]   PHYSICIAN:  Donna Bernard, M.D.             DATE OF BIRTH:  Oct 08, 1945   DATE OF PROCEDURE:  12/21/2001  DATE OF DISCHARGE:  12/27/2001                                EKG INTERPRETATION   EKG reveals normal sinus rhythm with nonspecific ST-T changes.                                               Donna Bernard, M.D.    Karie Chimera  D:  01/31/2002  T:  02/05/2002  Job:  [id number]

## 2010-09-18 NOTE — Op Note (Signed)
NAME:  Lance [name], Lance [name] NO.:  [id number]   MEDICAL RECORD NO.:  [id number]          PATIENT TYPE:  OBV   LOCATION:  0476                         FACILITY:  [facility]   PHYSICIAN:  Jene Every, M.D.    DATE OF BIRTH:  March 27, 1946   DATE OF PROCEDURE:  04/20/2004  DATE OF DISCHARGE:                                 OPERATIVE REPORT   PREOPERATIVE DIAGNOSES:  Medial meniscus tear, degenerative joint disease  left knee.   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   PROCEDURE:  Left knee arthroscopy, partial medial meniscectomy,  chondroplasties of the patella, medial and lateral condyle, medial and  lateral tibial plateau.   ANESTHESIA:  General.   ASSISTANT:  None.   BRIEF HISTORY:  A 65 year old with left knee pain, locking and giving way.  X-rays indicating significant degenerative changes.  Operative intervention  was indicated for diagnosis and treatment. The risks and benefits were  discussed including bleeding, infection, injury to vascular structures, no  change in symptoms, worsening symptoms, anesthetic complications, need for  total knee arthroplasty in the future, etc.   TECHNIQUE:  The patient in the supine position after an adequate level of  general anesthesia and 1 g of Kefzol.  The left lower extremity was prepped  and draped in the usual sterile fashion. A lateral parapatellar portal and  superior medial  parapatellar portal was fashioned with a #11 blade, Ingress  cannula atraumatically placed.  Irrigant was utilized to insufflate the  joint.  Under direct visualization, the medial parapatellar portal was  fashioned with a #11 blade after localization with an 18 gauge needle  sparing the medial meniscus.  Inspection of the suprapatellar pouch revealed  extensive grade 3 and 4 changes of the patella with loose  cartilaginous  debris. There was grade 4 changes of the femoral condyles.  The shaver was  introduced via the medial portal and utilized to perform a chondroplasty of  the patella and the femoral condyles. There was normal patellofemoral  tracking.  Examination of the medial compartment revealed a posterior medial  meniscus tear radial.  A basket was introduced and utilized to perform a  partial medial meniscectomy to a stable base.  Significant grade 3 changes  of the femoral condyle and the tibial plateau and a chondroplasty was  performed of each. Some degenerative changes and degenerative tearing of the  ACL and PCL.  The lateral compartment revealed some grade changes of the  femoral condyle and the tibial plateau, chondroplasty was performed of both  to a stable base. The lateral meniscus had some mild degenerative fraying  which was shaved and was otherwise stable to propalpation without  significant tear. The gutters were unremarkable, the knee was copiously  lavaged.  __________ examined and there was no evidence of residual  pathology __________ treatment.  All instrumentation was removed, portals  were closed with 4-0 nylon simple  suture.  Marcaine 0.25% with epinephrine was infiltrated in the joint, wound  was dressed sterilely, awoken without difficulty and transported to the  recovery room in satisfactory condition.  The patient tolerated the  procedure well with no complications. No tourniquet was utilized.     Trey Paula   JB/MEDQ  D:  04/20/2004  T:  04/20/2004  Job:  [id number]

## 2010-09-18 NOTE — Letter (Signed)
February 28, 2006    Scott A. Gerda Diss, MD  [address]  [place], [place] [address]   RE:  BIRDIE, BEVERIDGE  MRN:  [id number]  /  DOB:  [birth year]   Dear Lorin Picket:   Mr. Higham returns to the office for continued assessment and treatment of  coronary disease.  Since his last visit, symptoms have improved.  He  continues to have momentary sharp pain in the left chest, substernal region  and through to his back.  These last for a matter of seconds.  He has not  attempted specific treatments for these.  There are no associated symptoms.   Workup has been negative.  CBC and chemistry profile were normal except for  elevated glucose and slightly low platelets.  BNP and TSH level were normal.  His chest x-ray was unremarkable except for low volumes.  His stress nuclear  study was negative except with better exercise tolerance than expected.   Mr. Vermeer has a six-pack-a-day habit of chewing tobacco.  He has been  attempting to taper this.  He has had issues with both the habit of having  something in his mouth and feeling jittery when he abstains.   EXAMINATION:  GENERAL:  Pleasant gentleman in no acute distress.  VITAL SIGNS:  Weight is 225, 5 pounds more than earlier this month.  Blood  pressure 140/70, heart rate 85 and regular, respirations 16.  NECK:  No jugular venous distention, normal carotid upstrokes without  bruits.  LUNGS:  Clear.  CARDIAC:  Normal first and second heart sounds; minimal systolic murmur.  ABDOMEN:  Soft and nontender; no organomegaly.  EXTREMITIES:  No edema.   IMPRESSION:  Mr. Juba' cardiac status appears stable.  His defibrillator is  being monitored transtelephonically.  Vaccinations are up-to-date.  He is  strongly encouraged to completely give up chewing tobacco.  If control of  diabetes does not improve at that point, you may wish to refer him to an  endocrinologist.  I will plan to see this nice gentleman again in 2 months  and try to persuade him to  use a pharmacologic assistance in tobacco  cessation if he has not essentially quit by that time.    Sincerely,      [name], MD, [facility]    RMR/MedQ  DD: 02/28/2006  DT: 03/01/2006  Job #: [id number]

## 2010-09-18 NOTE — Op Note (Signed)
NAME:  Lance [name], REPASS                          ACCOUNT NO.:  [id number]   MEDICAL RECORD NO.:  [id number]                   PATIENT TYPE:  INP   LOCATION:  2550                                 FACILITY:  [facility]   PHYSICIAN:  Sharolyn Douglas, M.D.                     DATE OF BIRTH:  Oct 27, 1945   DATE OF PROCEDURE:  08/29/2002  DATE OF DISCHARGE:                                 OPERATIVE REPORT   PREOPERATIVE DIAGNOSIS:  1. Cervical spondylotic radiculopathy.  2. Painful hardware, cervical spine.  3. Degenerative cervical disk disease.   POSTOPERATIVE DIAGNOSIS:  1. Cervical spondylotic radiculopathy.  2. Painful hardware, cervical spine.  3. Degenerative cervical disk disease.   OPERATION PERFORMED:  1. Exploration of cervical spinal fusion C4 through 6 with removal of     anterior cervical plate.  2. Revision C6 anterior cervical corpectomy with decompression of the spinal     cord and C7 nerve roots bilaterally.  3. Anterior cervical arthrodesis, C6-7 utilizing a freeze-dried fibula     allograft.  4. Local autogenous bone graft.  5. Anterior cervical plating utilizing the EBI Vue-Lock system C6-7.  6. Neuro monitoring utilizing SSEP's and motor evoked potentials.   SURGEON:  Sharolyn Douglas, M.D.   ASSISTANT:  Real Cons, P.A.   ANESTHESIA:  General endotracheal.   COMPLICATIONS:  None.   INDICATIONS FOR PROCEDURE:  The patient is a 65 year old male who is status  post a C4-5 and C5-6 ACDF with allograft and plate by another Careers adviser.  He  has had persistent severe neck and bilateral upper extremity pain left  greater than right.  His plain radiographs show the plate has collapsed with  impingement on the C3-4 disk space.  The inferior screws are backing out.  MRI and CT myelogram show persistent spinal cord and nerve root compression  behind the C6 vertebral body as well as C6 foraminal stenosis.  Because of  his persistent symptoms unresponsive to conservative management, he  has  elected to undergo revision, decompression and fusion in hopes of improving  his symptoms.   DESCRIPTION OF PROCEDURE:  The patient was properly identified in the  holding area and taken to the operating room.  He underwent general  endotracheal anesthesia without difficulty.  He was given prophylactic  intravenous antibiotics.  He was carefully positioned on the operating room  table with the neck in slight hyperextension.  Five pounds of halter  traction was placed.  Neuro monitoring was established in the form of SEPS  and motor evoked potentials.  These were monitored throughout the procedure  and positioning process.  The neck was prepped and draped in the usual  sterile fashion.  A three-inch incision was made on the right side of the  neck opposite from his previous incision.  This was made in a transverse  fashion.  Dissection was carried down to the  platysma.  The platysma was  incised sharply and the interval between the sternocleidomastoid muscle and  the strap muscles was developed down to the prevertebral space.  The  esophagus, trachea and carotid sheath were identified and protected at all  times.  We encountered extensive scar tissue posteriorly over the previous  plate.  There were large osteophytes which partially covered the plate,  especially inferiorly.  The esophagus was mobilized and retracted medially.  The plate was exposed using a periosteal elevator.  Osteotomes were used to  remove the bony overgrowth.  We then used the appropriate screw drivers to  remove the plate.  We explored the C4-5 and C5-6 fusion. This appeared to be  solid.  All soft tissue was debrided from the anterior cervical spine.  We  then placed our deep Shadowline retractor.  We turned our attention to the  C6-7 level. There were large overhanging osteophytes completely covering the  disk space.  These were all removed with a Leksell rongeur.  We then  replaced Caspar distraction pins in  C7 and into the previous fusion mass.  Gentle distraction was applied.  A diskectomy was performed at C6-7 back to  the posterior longitudinal ligament.  The disk material appeared very  degenerative.  The interspace was narrowed.  We then used a high speed bur  to define the uncovertebral joints, removing the spurs.  One half of the C6  vertebral body was removed using the high speed bur back to the posterior  cortex.  We then entered the spinal canal using microcurets and pituitaries.  The posterior longitudinal ligament was removed and the decompression  carried out half way through the C6 vertebral body.  On the left side we  encountered residual bony osteophytes as well as soft disk material that was  compressing the C7 nerve root.  The spinal cord and nerve root were  completely decompressed.  We performed a foraminotomy bilaterally completely  exposing the C7 roots.  We confirmed that the foramina were patent using a  blunt probe.  We encountered epidural fibrosis about the left side of the  dural sac.  This was also carefully dissected free using microcurets.  It  should be noted that the microscope was used throughout the procedure for  illumination and magnification.  Once our decompression was complete, we  measured the defect and cut a freeze-dried allograft fibula at 2 cm in  length.  We then packed the fibula with local autogenous bone graft that had  been collected for the corpectomy.  The fibula was carefully tamped into  position.  We had excellent fit.  We then removed the Caspar distraction  pins.  We then placed a 28 mm EBI  Vue-Lock  plate with four 16 mm screws.  We had excellent purchase.  The wound was copiously irrigated.  We evaluated  the esophagus trachea and carotid sheath.  There were no apparent injuries.  The wound was copiously irrigated.  All bleeding was controlled with bipolar  electrocautery and Gelfoam.  A deep Penrose drain was placed.  The  platysma was closed with a running 2-0 Vicryl, subcutaneous layer closed with 3-0  Vicryl followed by a running 4-0 subcuticular Vicryl suture on the skin.  Benzoin and Steri-Strips placed.  Intraoperative x-ray showed good  positioning of the plate and bone graft.  Sterile dressing was applied.  Active cervical collar placed.  The patient was extubated without difficulty  and transferred to the recovery room in stable  condition able to remove his  upper and lower extremities.  It should be noted that there were no  deleterious changes in the SSEP's or motor evoked potentials throughout the  procedure.                                                Sharolyn Douglas, M.D.    MC/MEDQ  D:  08/29/2002  T:  08/29/2002  Job:  [id number]

## 2010-09-18 NOTE — Cardiovascular Report (Signed)
NAME:  Lance [name], MACPHERSON                          ACCOUNT NO.:  [id number]   MEDICAL RECORD NO.:  [id number]                   PATIENT TYPE:  OIB   LOCATION:  6525                                 FACILITY:  [facility]   PHYSICIAN:  Veneda Melter, M.D.                   DATE OF BIRTH:  1945-08-15   DATE OF PROCEDURE:  07/19/2003  DATE OF DISCHARGE:  07/20/2003                              CARDIAC CATHETERIZATION   PROCEDURES PERFORMED:  1. Left heart catheterization.  2. Left ventriculogram.  3. Selective coronary angiography.  4. Attempted percutaneous intervention, first diagonal branch of the left     anterior descending.  5. Percutaneous intervention of the second marginal branch of the left     circumflex artery.   DIAGNOSES:  1. Coronary atherosclerotic disease.  2. Normal left ventricular systolic function.  3. Unstable angina.   CARDIOLOGIST:  Veneda Melter, M.D.   HISTORY:  Lance [name] is a 65 year old male with diabetes mellitus and  coronary artery disease who has previously undergone percutaneous  intervention to the LAD and right coronary artery.  He presents now with  recurrence of chest discomfort that has become unstable prompting admission  to the hospital.  He was ruled out for an acute myocardial infarction and  underwent stress imaging study showing ischemia in the anterolateral walls  with overall well-preserved LV function.  He presents for further cardiac  assessment.   TECHNIQUE:  Informed consent was obtained.  The patient was brought to the  catheterization lab.  A 6 French sheath was placed in the right femoral  artery using the modified Seldinger technique.  Six [place] JL-4 and JR-4  catheters were then used to engage the left and right coronary arteries, and  selective angiography performed in various projections using manual  injection of contrast.  Subsequently a 6 French pigtail catheter was  advanced in the left ventricle and a left ventriculogram performed  using  power injection of contrast.   FINDINGS:  Initial findings were as follows.  1. Left Main Trunk:  The left main trunk is a large caliber vessel with an     ostial taper of 30-40%.   1. LAD:  The LAD is a medium caliber vessel that provides two diagonal     branches.  The LAD has a previously placed stent in the proximal mid     section that straddles the first diagonal branch.  There is mild in-stent     restenosis of 20-30%.  There is a focal eccentric plaque of 40% in the     midsection prior to the takeoff of the second diagonal branch.  The     distal LAD has mild diffuse disease of 30%.  The first diagonal branch is     a small caliber vessel that is subtotally occluded in its proximal     segment.  There is TIMI II flow  in the distal vessel and it has an     extensive distribution to the anterolateral wall.  The second diagonal     branch is a trivial vessel.   1. Left Circumflex Artery:  The left circumflex artery is a large caliber     vessel that provides a trivial first marginal branch in the proximal     segment and two large marginal branches in the midsection.  The AV     circumflex has mild disease of 20-30%.  The first marginal branch, as     mentioned, is a trivial vessel that is occluded proximally.  There is     TIMI I flow in the distal vessel.  The second marginal branch has an     ostial narrowing of 90% with further disease of 70% in the proximal     segment.  The third marginal branch has mild disease of 30-40% in the     proximal mid section.   1. Right Coronary Artery:  The right coronary artery is dominant.  This is a     large caliber vessel that provides the posterior descending artery and     posterior ventricular branch in the terminal segment.  The right coronary     artery has a previously placed stent in the midsection that is widely     patent with mild in-stent restenosis of 30%.  The distal vessel has mild     disease as well.  The posterior  descending artery has an ostial narrowing     of 60% and is a small caliber vessel.   1. LV:  Normal end-systolic and end-diastolic dimensions.  Overall left     ventricular function is well-preserved.  Ejection fraction approximately     55%.  There is very mild hypokinesis of the mid anterior wall.  No mitral     regurgitation is noted.  LV pressure is 140/15, aortic is 140/90 and     LVEDP equals 25.   With these findings we elected to proceed with an attempt at percutaneous  intervention to the first diagonal branch as well as the second marginal  branch.   The patient had been pretreated with aspirin, heparin and Integrilin.  He  was given supplemental heparin to maintain an ACT of greater than 250  seconds.  He was also given 300 mg of Plavix at the end of the case.   A 6 [place] JL-4 guide catheter was initially used to engage the left  coronary artery; however, appropriate support could not be obtained and this  was exchanged for a CLS-4 guide catheter, which also did not provide  significant support, but did reach the ostium fairly well.  A 0.014 inch  [employer] wire was introduced and used to engage the first diagonal  branch, and multiple attempts made to cross the proximal occlusion; however,  this proved unsuccessful; and, a 1.5 x 15 mm Maverick balloon was introduced  for support.  This continued to be unsuccessful.  These were retracted and a  Graphics PT-2 wire introduced using a long balloon, and this used to probe  the occlusion.  The wire was successfully advanced distally; however, we  could not advance the balloon to confirm position within a true lumen of the  vessel.  Over the wire the balloon was exchanged and a rapid exchange  balloon introduced, and this could be advanced into the midsection of the  vessel; however, again we could not confirm  position in the true lumen.  We attempted to inflate the Maverick balloon; however, the balloon did rupture.   There did appear to be a slight dissection, which appeared to be subintimal.   Repeat angiography did not show compromise of flow to the distal section of  the diagonal branch; and, again, as we could not confirm position within the  true lumen we elected not to proceed further.  The wire and guide catheter  were then removed.   Attention was then focused on the second marginal branch, and the ProWater  wire advanced to the distal section of the third marginal branch.  The PT  wire was then introduced and positioned in the distal section of the second  marginal branch, and a 3.0 x 10 mm cutting balloon used to predilate the OM-  2.  A total of three inflations were performed at six atmospheres for 30  seconds.  Repeat angiography showed significant improvement in vessel lumen,  but there was moderate residual disease of 70% at the ostium due to vessel  recoil. Due to extension of the lesion up to the ostium it was felt that  protection of the second marginal branch would be necessary.  A 3.0 x 15 mm  Quantum Maverick balloon was introduced.  This was positioned in the AV  circumflex extending up to the third marginal branch and a 3.0 x 23 mm  CYPHER drug-eluting stent was then positioned in the second marginal branch.  The Quantum Maverick balloon was pulled back and inflated to four  atmospheres and then the CYPHER stent pulled back, and bifurcation  angioplasty performed with stent delivery into the OM-2.  The stent was  delivered at 10 atmospheres for 55 seconds.  Subsequently the Quantum  Maverick balloon was used to post dilate the CYPHER stent.  Two inflations  were performed at 12 atmospheres for 30 seconds.  Repeat angiography  performed after the administration of intracoronary nitroglycerin showed an  excellent result with full coverage of the ostium and proximal lesion in the  OM-2 with no residual stenosis and no vessel damage.  There was residual  disease of 30-40% in the  third marginal branch, which was unchanged and  there was no compromise of flow through this vessel.   The wires were retracted and a gentle probe made into an OM-1, which  confirmed that this vessel was 100% occluded at its origin.   Final angiography was performed in various projections showing TIMI III flow  through the left coronary system with no evidence of distal vessel damage of  the circumflex artery and no extravasation from the attempted PCI of the  first diagonal branch.  The guide catheter was then removed and the sheath  secured in position.   The patient tolerated the procedure well and was transferred to the holding  area in stable condition.   FINAL RESULTS:  1. Unsuccessful percutaneous coronary intervention of the first diagonal     branch, left anterior descending, which is occluded.  2. Successful percutaneous transluminal coronary angioplasty and stent    placement to the large second marginal branch, and showed the left     circumflex artery with reduction of sequential 90 and 70% narrowings to     0% with placement of a 3.0 x 23 millimeter CYPHER drug-eluting stent.   ASSESSMENT AND PLAN:  Mr. Tsutsui is a 65 year old gentleman who presented  with unstable angina.  He has ischemia in the anterolateral wall most likely  due to the first diagonal branch and first marginal branch, which are both  occluded.  He has undergone percutaneous intervention to the OM-2 in the  hopes of improving flow to the lateral wall and reducing is symptoms.  Aggressive medical therapy will be pursued.  Should the patient have  refractory discomfort reassessment of the diagonal branch may be necessary.                                               Veneda Melter, M.D.    NG/MEDQ  D:  07/19/2003  T:  07/21/2003  Job:  [id number]   cc:   Lorin Picket A. Gerda Diss, M.D.  [address], [address]  [place]  [place] [address]  Fax: [phone]   [place] Bing, M.D.

## 2010-09-18 NOTE — Letter (Signed)
February 17, 2006    Lance A. Gerda Diss, MD  [address], [address]  [place], [place] [address]   RE:  Lance [name], Lance [name]  MRN:  [id number]  /  DOB:  07-24-1945   Dear Lance [name]:   It was my pleasure assessing Lance [name] in the office today in consultation  at your request.  He was supposed to return approximately one year ago for  followup, but became confused between his defibrillator followup and his  general cardiology followup.  Nonetheless, he did well until the recent  months when he has experienced orthostatic lightheadedness, general  weakness, increased fatigability, decreased exercise tolerance, exertional  dyspnea, and one episode of exertional chest discomfort.  He becomes  breathless with just a short walk on flat ground.  Symptoms resolve quickly.  On one occasion he developed some chest tightness, for which he did not take  sublingual nitroglycerin.  The symptoms faded over the course of perhaps an  hour.  He recalls being evaluated in the emergency department within the  past year or two for chest discomfort.  He became hypotensive with  sublingual nitroglycerin, but was ultimately discharged from the ED.   His defibrillator has never discharged.  Function has been assessed in our  electrophysiology clinic.   CURRENT MEDICATIONS:  1. Prevacid 30 mg b.i.d.  2. Aspirin 325 mg daily.  3. Amlodipine 5 mg daily.  4. Toprol 100 mg daily.  5. Clopidogrel 75 mg daily.  6. Atorvastatin 80 mg daily.  7. Fish oil 1000 mg daily.  8. Lisinopril 2.5 mg daily.  9. Metformin 1000 mg b.i.d.  10.Citalopram 40 mg daily.  11.Nabumetone 500 mg b.i.d.  12.Humalog by sliding scale.  13.Lantus 65 units nightly.  14.Flomax 0.4 mg daily.  15.He also uses hydrocodone and alprazolam on an as-needed basis.   Review of systems, family history and social history were updated.   PHYSICAL EXAMINATION:  Pleasant, fairly trim gentleman in no acute distress.  The weight is 220, 11 pounds less than in May  of this year, blood pressure  130/75, heart rate 75 and regular, respirations 16.  NECK:  No jugular venous distention; normal carotid upstrokes without  bruits.  LUNGS:  Clear.  CARDIAC:  Normal first and second heart sounds; fourth heart sound present;  normal PMI.  ABDOMEN:  Soft and nontender; no organomegaly; no bruits.  EXTREMITIES:  Distal pulses intact; no edema.  NEUROMUSCULAR:  Symmetric strength and tone; normal cranial nerves.  SKIN:  No significant lesions.  PSYCHIATRIC:  Alert and oriented; normal affect.   EKG:  Normal sinus rhythm; left atrial abnormality; right ventricular  conduction delay; nondiagnostic inferior Q waves with nonspecific T wave  abnormality -- this is new compared with a previous tracing obtained in  April 2005.   IMPRESSION:  [name] presents with progressive symptoms, which generally  do not sound like unstable angina.  His lightheadedness and fatigue are  compatible with hypotension or orthostatic hypotension, but that is not  documented on this visit.  Exertional dyspnea could reflect pulmonary  problems, cardiac problems or anemia.  We will check basic laboratory  studies and a chest x-ray.  A pharmacologic stress Myoview study will be  performed to evaluate myocardial perfusion and ejection fraction.  We will  try low-level exercise along with pharmacologic stress to evaluate his  exertional symptoms.  A return visit will be scheduled after these studies  have been completed.   Sincerely,     Lance Friends. Rothbart,  MD, [facility]    RMR/MedQ  /  Job #:  [id number]  DD:  02/17/2006 / DT:  02/20/2006

## 2010-09-18 NOTE — H&P (Signed)
NAME:  Lance [name], Lance [name]                          ACCOUNT NO.:  [id number]   MEDICAL RECORD NO.:  [id number]                   PATIENT TYPE:  INP   LOCATION:  3705                                 FACILITY:  [facility]   PHYSICIAN:  Rollene Rotunda, M.D. [facility]            DATE OF BIRTH:  November 25, 1945   DATE OF ADMISSION:  12/21/2001  DATE OF DISCHARGE:                                HISTORY & PHYSICAL   REASON FOR ADMISSION:  Evaluate the patient with non-Q-wave myocardial  infarction.   HISTORY OF PRESENT ILLNESS:  The patient is a 65 year old gentleman who  reports a history of coronary artery disease in 1997 with catheterization.  He does not recall the details of that event.  He had chest pain at that  point and time and says he had a myocardial infarction.  He says that he had  chest pain again in April of this year.  He does not recall what kind of  evaluation he had.  He has had no chest discomfort since then until last  night.  He developed this around 12:30 a.m.  It was 8 out of 10 in intensity  but would last only a few minutes before it would ease off to a 2 out of 10  in intensity.  It was substernal.  He had a sensation like he felt like he  needed to burp.  He tried to drink some Coca-Cola.  He went off to sleep but  awoke with residual discomfort.  He was to have neck surgery at [facility].   En route, he had recurrent discomfort and so he stopped to get an egg  sandwich to try to ease off his stomach.  When he got to [facility], the anesthesiologist refused surgery because of this.  He came to  see Dr. Lorin Picket A. Luking today and described this chest discomfort.  He was  hospitalized where he subsequently was found to have elevated troponins as  described below.  Of note, his pain did not ease initially with sublingual  nitroglycerin.  However, his pain went away in the ambulance with Morphine  and IV nitroglycerin.   PAST MEDICAL HISTORY:  1. Diabetes  mellitus x 15 years.  2. Hypertension x 10 years.  3. Coronary artery disease.  4. Depression/anxiety.  5. Herniated disc.  6. History of kidney stones.  7. Coronary disease.  8. Gastroesophageal reflux disease.   PAST SURGICAL HISTORY:  Discectomy.   ALLERGIES:  None.   MEDICATIONS:  1. Glucophage 1000 mg b.i.d.  2. Insulin 70/30 with 64 units q.a.m. and 46 units q.p.m.  3. Aspirin 325 mg q.d.  4. Lipitor 20 mg q.d.  5. Norvasc 10 mg q.d.  6. Prevacid 30 mg q.d.  7. Metoprolol XL 25 mg q.d.  8. Lexapro 10 mg q.d.  9. Valium 10 mg p.r.n.   SOCIAL HISTORY:  Lives in  [place] with his wife of four years.  He had  one son who died of an accidental gunshot wound in January.  He has one  other living child.  He smokes two to three cigarettes a day.  He chews  tobacco.  He is disabled because of back pain.   FAMILY HISTORY:  Noncontributory for early coronary disease as his father  died at age 39 of a myocardial infarction.   REVIEW OF SYSTEMS:  Positive for pain in his feet and knees, urinary  hesitancy, vertigo, leg pain with walking, arthralgias, diffuse joint pains,  polyuria, polydipsia.  Otherwise, as stated in the HPI.  Negative for other  systems.   PHYSICAL EXAMINATION:  GENERAL:  The patient is in no distress.  VITAL SIGNS:  Blood pressure 127/73, heart rate 88 and regular.  HEENT:  Eyes were unremarkable.  Pupils are equal, round, and reactive to  light.  Fundi not visualized.  Oral mucosa is unremarkable.  NECK:  No jugular venous distention.  Wave form within normal limits.  Carotid upstrokes brisk and symmetric.  No bruits and no thyromegaly.  LYMPHATICS:  No cervical, axillary, or inguinal adenopathy.  LUNGS:  Clear to auscultation bilaterally.  BACK:  No costovertebral angle tenderness.  CHEST:  Unremarkable.  HEART:  PMI not displaced or sustained.  S1 and S2 within normal limits.  No  S3, no S4, and no murmurs.  ABDOMEN:  Obese.  Positive bowel sounds.   Normal in frequency and pitch.  No  bruits, rebound, guarding, midline pulsatile mass.  No organomegaly or  splenomegaly.  SKIN:  No rashes and no nodules.  EXTREMITIES:  There are 2+ pulses.  No edema.  NEUROLOGICAL:  Grossly intact.   LABORATORY DATA:  EKG with sinus rhythm, rate 78, axis within normal limits.  Intervals within normal limits.  No acute ST or T-wave changes.   Hemoglobin 13.9, WBC 5.1, platelets 151,000.  Sodium 135, potassium 3.7, BUN  10, creatinine 1.0, CK 211, MB 11, troponin 0.36.   ASSESSMENT/PLAN:  1. Non-Q-wave myocardial infarction:  The patient has non-Q-wave myocardial     infarction with slightly elevated troponin.  He is currently pain-free.     He will be managed with Lovenox, aspirin, and beta blocker, and will be     placed for cardiac catheterization and more urgently if needed.  2. Diabetes:  He will continue on a sliding scale and insulin coverage.  We     will hold the Glucophage in anticipation of his cardiac catheterization.     He will have diabetes education.  3. Hypertension:  We will favor beta blocker and ACE inhibitors for     management of this.  We will hold his Norvasc for now.  4.     Hyperlipidemia:  He will continue on a Lipitor and have a lipid profile.  5. Depression:  He will continue on his Lexapro.  6. Gastroesophageal reflux disease:  He will continue on the Prevacid.                                               Rollene Rotunda, M.D. [facility]    JH/MEDQ  D:  12/22/2001  T:  12/24/2001  Job:  [id number]   cc:   [name], M.D. [facility]  [address]  [place]  [place] [address]  Fax:  1   Scott A. Gerda Diss, M.D.

## 2010-09-18 NOTE — H&P (Signed)
NAME:  Lance [name], Lance [name]                          ACCOUNT NO.:  [id number]   MEDICAL RECORD NO.:  [id number]                   PATIENT TYPE:  INP   LOCATION:  IC03                                 FACILITY:  [facility]   PHYSICIAN:  Scott A. Gerda Diss, M.D.               DATE OF BIRTH:  Aug 16, 1945   DATE OF ADMISSION:  07/17/2003  DATE OF DISCHARGE:                                HISTORY & PHYSICAL   CHIEF COMPLAINT:  Chest discomfort since this a.m.   HISTORY OF PRESENT ILLNESS:  This is a 65 year old white male who presents  with chest discomfort that has been present over the course of the past 24  hours, feels like a central chest pressure and tightness.  Denies  nausea/vomiting with it.  Denies reflux.  Denies shortness of breath but  states when he tries to take a deep breath he feels like he cannot quite get  a full breath in.  The patient denies any fevers, chills, productive cough,  sinus symptoms.   PAST MEDICAL HISTORY:  DM, CAD, previous MI, stents, HTN, lumbar disease,  hyperlipidemia, GERD.   FAMILY HISTORY:  Hypertension, diabetes.   SOCIAL HISTORY:  Does not smoke.   ALLERGIES:  PENICILLIN.   REVIEW OF SYSTEMS:  See per above.   PHYSICAL EXAMINATION:  GENERAL:  NAD.  HEENT:  TMs NL, T-NL, neck is supple.  CHEST:  CTA, no crackles.  HEART:  Regular, pulse normal.  ABDOMEN:  Soft.  EXTREMITIES:  No edema.   BP mild elevation.  EKG no acute ST segment changes.   ASSESSMENT AND PLAN:  Chest pain - chest discomfort concerning for a  possibility of coronary artery disease, ischemia.  Nitroglycerin x3 here in  the office.  Admit to ICU - see orders, and consult cardiology     ___________________________________________                                         Jonna Coup. Gerda Diss, M.D.   SAL/MEDQ  D:  07/18/2003  T:  07/18/2003  Job:  [id number]

## 2010-09-18 NOTE — H&P (Signed)
NAME:  Lance [name], Lance [name]                          ACCOUNT NO.:  [id number]   MEDICAL RECORD NO.:  [id number]                   PATIENT TYPE:  INP   LOCATION:  2550                                 FACILITY:  [facility]   PHYSICIAN:  Patricia Nettle, M.D.                  DATE OF BIRTH:  1946/01/22   DATE OF ADMISSION:  08/29/2002  DATE OF DISCHARGE:                                HISTORY & PHYSICAL   CHIEF COMPLAINT:  Neck and left upper extremity pain.   HISTORY OF PRESENT ILLNESS:  The patient is a 65 year old male with neck and  upper extremity pain for quite some time now.  He actually underwent an  anterior cervical diskectomy and fusion at C4-C6 back in 2002.  He has been  continuing to have problems since then with severe neck and left upper  extremity pain and numbness.  Secondary to the physical examination findings  as well as findings on MRI, it was felt that his best course of management  would be removal of hardware at C4-C6 and C6 corpectomy followed by anterior  cervical diskectomy at C6-7.  The risks and benefits of the proposed surgery  were discussed with the patient by Dr. Sharolyn Douglas and he indicated  understanding and opted to proceed.   ALLERGIES:  PENICILLIN.   MEDICATIONS:  1. Glucophage 1000 mg p.o. b.i.d.  2. Lipitor 20 mg p.o. daily.  3. Effexor XR 37.5 mg p.o. daily.  4. Norvasc 5 mg p.o. daily.  5. Prevacid 30 mg p.o. daily.  6. Valium 5 mg p.o. p.r.n.  7. Aspirin 325 mg p.o. daily which he is stopping in preparation for     surgery.   PAST MEDICAL HISTORY:  1. Diabetes mellitus type 2.  2. Hypertension.  3. GERD.  4. MI on December 21, 2001, and then again on January 28, 2002.   PAST SURGICAL HISTORY:  1. Stent placement in August 2003 and September 2003.  2. Lumbar discectomy in 2000.  3. Anterior cervical diskectomy and fusion C4-C6 in 2002.   HABITS:  The patient denies tobacco use and denies alcohol use.   SOCIAL HISTORY:  He is married.  His  wife is in good health and will be  available to help him through his postoperative course.  He is not currently  working secondary to the above problems.   FAMILY HISTORY:  Noncontributory.   REVIEW OF SYSTEMS:  The patient denies any fevers, chills, sweats, or  bleeding tendencies.  CENTRAL NERVOUS SYSTEM:  Denies blurry vision, double  vision, paresis or paralysis.  CARDIOVASCULAR:  Denies chest pain, angina,  orthopnea, claudication or palpitations.  PULMONARY:  Denies shortness of  breath, productive cough or hemoptysis.  GASTROINTESTINAL:  Denies nausea,  vomiting, constipation, or diarrhea, melena or bloody stool.  GENITOURINARY:  Denies dysuria, hematuria or discharge.  MUSCULOSKELETAL:  As per HPI.   PHYSICAL  EXAMINATION:  GENERAL:  The patient is a 65 year old male who is  alert and oriented and in no acute distress.  He is well nourished and well  groomed and appears his stated age.  He is pleasant and cooperative during  the examination.  VITAL SIGNS:  Blood pressure 136/82, respiratory rate 16 and unlabored,  pulse 86 and regular.  HEENT:  Head is normocephalic and atraumatic.  Pupils are equal, round and  reactive to light.  Extraocular movements are intact.  Nares patent.  Pharynx clear.  NECK:  Soft to palpation; no lymphadenopathy, thyromegaly or bruits  appreciated.  Previous incision on the left side of his inferior neck is  well healed.  CHEST:  Clear to auscultation bilaterally, no rales, rhonchi, stridor, or  friction rubs.  BREASTS:  Examination not pertinent and not performed.  CARDIOVASCULAR:  S1 and S2 regular rate and rhythm; no murmurs, rubs, or  gallops noted.  ABDOMEN:  Soft to palpation with positive bowel sounds, nontender and  nondistended with no organomegaly noted.  GENITALIA:  Not pertinent and not performed.  EXTREMITIES:  Motor and sensation are grossly intact.  Radial pulses are  intact and symmetric.  Dorsal pedis pulses and posterior  tibialis pulses are  intact and symmetric.  He has a slightly decreased sensation diffusely of  the left upper extremity.  Reflexes are 1+ and symmetric throughout  bilateral upper extremities and lower extremities.   IMPRESSION:  1. Painful hardware and cervical spondylitic radiculopathy.  2. Diabetes mellitus type 2.  3. Hypertension.  4. Gastroesophageal reflux disease.  5. Status post myocardial infarction in August 2003, and September 2003.   PLAN:  1. Admit to [facility] on August 29, 2002, for removal of hardware     as well as a corpectomy of C6 and anterior cervical diskectomy at C6-7.     This will be done by Dr. Sharolyn Douglas.  2. The patient's primary care physician is Tuleta Bing, M.D.  He did     forward [place] a letter indicating that the patient is cleared for surgery.     Certainly we will contact him should any medical issues arise in his     perioperative course.     Colleen P. Mahar, P.A.-C                  Patricia Nettle, M.D.    CPM/MEDQ  D:  08/29/2002  T:  08/29/2002  Job:  [id number]

## 2010-09-18 NOTE — H&P (Signed)
NAME:  Lance [name], Lance [name]                          ACCOUNT NO.:  [id number]   MEDICAL RECORD NO.:  [id number]                   PATIENT TYPE:  AMB   LOCATION:  DAY                                  FACILITY:  [facility]   PHYSICIAN:  Patricia Nettle, M.D.                  DATE OF BIRTH:  11-26-1945   DATE OF ADMISSION:  12/21/2001  DATE OF DISCHARGE:                                HISTORY & PHYSICAL   CHIEF COMPLAINT:  Neck and right upper extremity pain.   HISTORY OF PRESENT ILLNESS:  The patient is a 65 year old white male who is  status post anterior cervical diskectomy and fusion at C4-5 and 5-6 done by  another physician in town.  He has been having continued pain, no resolution  of symptoms since the surgery.  Actually, increase in neck pain, continued  right upper extremity pain.  It has gotten to the point that it is near  constant in nature and affects his activities of daily living and quality of  life.  He has seen Dr. Sharolyn Douglas who thought he may benefit from removal of  hardware C4-C6.  All the risks and benefits of the above surgery were  discussed with the patient by Dr. Sharolyn Douglas.  He does indicate understanding  and does wish to proceed.   PAST MEDICAL HISTORY:  1. Diabetes mellitus.  2. Hypertension.  3. GERD.   PAST SURGICAL HISTORY:  1. Cardiac catheterization in 1997 which the patient believes he had two     stents placed.  2. Lumbar surgery in 2000 and anterior cervical diskectomy and fusion at C4-     5, 5-6 in 2002.   ALLERGIES:  PENICILLIN.   MEDICATIONS:  1. Glucophage 1000 mg p.o. b.i.d.  2. Insulin 70/30 46 units in the afternoon and 64 units in the morning.  3. Neurontin 300 mg two p.o. t.i.d.  4. Lipitor 20 mg p.o. q.d.  5. Effexor XR 37.5 mg p.o. q.d.  6. Norvasc 5 mg p.o. q.d.  7. Prevacid 30 mg p.o. q.d.  8. __________ 25 mg one-half tablet p.o. q.d.  9. Diazepam 5 mg p.r.n.  10.      Aspirin 325 two tablets p.o. q.d.   REVIEW OF SYMPTOMS:  The  patient denies any fevers, chills, sweats, bleeding  tendencies.  CNS:  Denies any blurred vision, double vision, seizures,  headache, paralysis.  CARDIOVASCULAR:  Denies chest pain, angina, orthopnea,  claudication, palpitations.  PULMONARY:  Denies any shortness of breath,  productive cough, or hemoptysis.  GASTROINTESTINAL:  Denies nausea,  vomiting, constipation, diarrhea, melena, bloody stools.  GENITOURINARY:  Denies dysuria, hematuria, discharge.  MUSCULOSKELETAL:  As per HPI.   FAMILY HISTORY:  Father deceased at age 45 secondary to MI.  Also with a  history of diabetes and CVA.  Mother still living with diabetes.  Brother at  age 56 with  diabetes.   SOCIAL HISTORY:  The patient is married.  He is self employed.  Owns a  Engineer, maintenance.  He has been unable to work since his low back  surgery.  He does smoke one to two cigarettes per day and also uses chewing  tobacco.  Alcohol:  Denies.  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  Children:  Two.  His wife will be available to  help him postoperatively.  She is in good health.  He lives in a one-level  home.   PHYSICAL EXAMINATION:  VITAL SIGNS:  Blood pressure 154/104, respirations 16  and unlabored, pulse 88 and regular.  GENERAL:  The patient is a 65 year old white male who is alert and oriented,  in no acute distress.  He is well-nourished, well-groomed.  Appears stated  age.  Pleasant, cooperative to examination.  NECK:  Soft to palpation.  No bruits appreciated.  There is a scar noted  from his previous anterior cervical diskectomy and fusion.  This is on the  left side.  At the medial portion of this incision there is a small white  area.  It does not appear to have any fluid in it.  Upon palpation it is  nontender.  The patient does state that these do come up every now and again  and resolve on their own.  Does not appear to be any purulence.  No  erythema.  Does not seem  infected.  CHEST:  Clear to auscultation.  No rales, rhonchi, strider, wheezes, or  friction rubs.  BREASTS:  Not pertinent.  Not performed.  HEART:  S1, S2.  Regular rate and rhythm.  No murmur, rub, or gallop noted.  ABDOMEN:  Soft to palpation.  Positive bowel sounds.  Nontender,  nondistended.  No organomegaly noted.  GENITOURINARY:  Not pertinent.  Not performed.  EXTREMITIES:  His motor examination in bilateral upper extremities is  intact.  Sensation is intact to light touch.  He does complain of right  upper extremity pain.  Radial pulses are intact and equal bilaterally.   LABORATORIES:  X-ray shows a C4-5, 5-6 anterior cervical diskectomy and  fusion.  CT did reveal C4-5 and 5-6 fusion to be healed with some kyphosis.  The plate is impinging on a C3-4 disk and C3 vertebral body.  Mild stenosis  and mild foraminal narrowing at multiple levels.   IMPRESSION:  1. Status post anterior cervical diskectomy and fusion with collapse of     plate and continued pain.  2. Diabetes.  3. Hypertension.  4. Gastroesophageal reflux disease.   PLAN:  1. Admit to [facility] for removal of anterior cervical plate at     Z6-1 and 5-6.  Will be done by Dr. Sharolyn Douglas with an exploration of the     fusion.  2. The patient's primary care physician is Dr. Lilyan Punt.  He has     forwarded a letter of medical clearance stating that the patient has a     history of coronary artery disease, hypertension, diabetes all of which     are stable.  Negative Cardiolite in 2002.  Stable for surgery.  We will     contact [name] should any medical issues or problems arise in the     perioperative course.     Colleen Mahar, P.A.  Patricia Nettle, M.D.    CM/MEDQ  D:  12/19/2001  T:  12/19/2001  Job:  [id number]

## 2010-09-18 NOTE — Procedures (Signed)
NAME:  Lance [name], Lance [name]                          ACCOUNT NO.:  [id number]   MEDICAL RECORD NO.:  [id number]                   PATIENT TYPE:  INP   LOCATION:  IC03                                 FACILITY:  [facility]   PHYSICIAN:  Pricilla Riffle, M.D.                 DATE OF BIRTH:  04-03-1946   DATE OF PROCEDURE:  DATE OF DISCHARGE:                                    STRESS TEST   INDICATION:  Mr. Lezcano is a 65 year old male with known coronary artery  disease status post non-Q myocardial infarction, treated with stent to his  LAD in August of 2003.  He also had nonobstructive residual disease by  catheterization at that time.  In September of 2003, catheterization  revealed occluded right coronary artery stent placed there, and an ejection  fraction of 55% at the time.  He now presents with recurrent chest  discomfort.  His cardiac enzymes have been negative x2 for acute myocardial  infarction and EKG revealed no ischemic changes.   BASELINE DATA:  EKG revealed sinus rhythm at a rate of 90 beats per minute,  nonspecific ST abnormalities.  Blood pressure is 130/72.   Adenosine 55 mg was infused over a four-minute protocol with Cardiolite  injected at three minutes.  The patient reported chest discomfort and  shortness of breath which resolved in recovery.  EKG revealed no ischemic  changes and no arrhythmia.   FINAL IMAGES AND RESULTS:  Pending M.D. review.     ________________________________________  ___________________________________________  Jae Dire, P.A. [facility]                      Pricilla Riffle, M.D.   AB/MEDQ  D:  07/18/2003  T:  07/18/2003  Job:  [id number]

## 2010-09-18 NOTE — Discharge Summary (Signed)
   NAME:  Lance [name], Lance [name] NO.:  [id number]   MEDICAL RECORD NO.:  [id number]                  PATIENT TYPE:   LOCATION:                                       FACILITY:   PHYSICIAN:  Donna Bernard, M.D.             DATE OF BIRTH:   DATE OF ADMISSION:  DATE OF DISCHARGE:                                 DISCHARGE SUMMARY   TRANSFER SUMMARY   FINAL DIAGNOSES:  1. Unstable angina.  2. Elevated cardiac enzymes suggestive of coronary ischemia and potential     myocardial damage.  3. Type 2 diabetes.  4. Hyperlipidemia.   FINAL DISPOSITION:  The patient was transferred to the care of    Cardiologist.   HOSPITAL COURSE:  Please see H&P dictated on the same day of discharge.  The  patient presented with chest pain that was concerning in nature particularly  with his known history.  He was admitted to the hospital.  The patient was  given nasal cannula O2, IV fluids, IV morphine.  His usual medications were  maintained.  Lovenox was initiated.  His chronic medicines were maintained.  Within several hours his first set of cardiac enzymes returned positive,  with a CPK of 211, CPK-MB of 11.0, and a troponin 0.36.  Based on these  along with ongoing discomfort, the patient was transferred to the unit.  He  was started on nitroglycerin drip.  In addition he was given IV Lopressor.  It was felt that the cardiologists through [facility] who graciously  accepted the patient in transfer.  The patient was transferred to [facility]  with diagnosis and disposition as noted above.                                               Donna Bernard, M.D.    Karie Chimera  D:  01/31/2002  T:  02/02/2002  Job:  [id number]

## 2010-09-18 NOTE — H&P (Signed)
NAME:  Lance [name], CEASE NO.:  [id number]   MEDICAL RECORD NO.:  [id number]                  PATIENT TYPE:   LOCATION:                                       FACILITY:   PHYSICIAN:  Donna Bernard, M.D.             DATE OF BIRTH:   DATE OF ADMISSION:  DATE OF DISCHARGE:                                HISTORY & PHYSICAL   CHIEF COMPLAINT:  Chest pain.   SUBJECTIVE:  This patient is a 65 year old white male with history of type  II diabetes mellitus, hypercholesterolemia, chronic smoking, and known  coronary artery disease who presents to the office on the day of admission  with complaints of chest pain. The patient woke up in the middle of the  night noting substernal chest discomfort which he described as a deep  pressure. He had no significant nausea or diaphoresis. He did note some  slight shortness of breath. Of note, the patient was due to have a cervical  spine surgery today. The patient noted no significant difference between  exertion and rest.   CURRENT MEDICATIONS:  1. Prinivil 20 mg daily.  2. Glucophage 1,000 mg bid.  3. Novolin 70/30 50 units in the a.m. and 38 units in the p.m.  4. Prevacid 30 mg daily  5. Ambien 10 mg  QHS.  6. Aspirin one daily 325 mg.  7. Xanax 0.5 mg as needed.  8. Oxycodone/acetaminophen as needed for pain.  9. Lipitor 20 mg daily.  10.      Neurontin 600 mg tid.  11.      Norvasc 5 mg daily.   PAST MEDICAL HISTORY:  1. Type II diabetes mellitus.  2. Hypertension.  3. Hypercholesterolemia.  4. Coronary artery disease.  5. He had cardiac catheterization in 1997 revealed 70% left anterior lesion.     Stress Cardiolite was negative at that time. He had another stress     Cardiolite in the spring of 2002. This also showed no significant     ischemia.  6. He had remote lumbar disk surgery.   FAMILY HISTORY:  Positive for hypertension, diabetes mellitus, and coronary  artery disease.   ALLERGIES:   PENICILLIN.   SOCIAL HISTORY:  The patient is married. Still smokes but not much per his  description. Alcohol intake none.   REVIEW OF SYSTEMS:  Otherwise negative. No significant GI symptoms  currently. The patient has had some reflux in the past but none currently.   PHYSICAL EXAMINATION:  VITAL SIGNS: Blood pressure 154/92. Afebrile. Pulse  80.  GENERAL: Alert and in no acute distress.  HEENT: Normal.  NECK: Supple. No lymphadenopathy. No jugular venous distention.  LUNGS: Clear to auscultation and percussion.  HEART: Regular rate and rhythm.  CHEST: No significant chest wall tenderness.  ABDOMEN: Benign. No rebound, guarding, or cardiovascular tenderness.  EXTREMITIES: Normal.   DIAGNOSTIC STUDIES:  EKG reveals normal sinus rhythm  with no significant STT  changes. Blood work pending.   IMPRESSION:  Chest pain, potentially significant for unstable angina in  patient with known 70% left anterior descending lesion in 1997 and  tremendous number of risk factors including family history, type II diabetes  mellitus, hypertension, hypercholesterolemia, chronic tobacco abuse.   PLAN:  Admit for presumed unstable angina and further worsening per chart.                                               Donna Bernard, M.D.    Karie Chimera  D:  12/21/2001  T:  12/22/2001  Job:  [id number]

## 2010-09-18 NOTE — Discharge Summary (Signed)
NAME:  Lance [name], Lance [name]                          ACCOUNT NO.:  [id number]   MEDICAL RECORD NO.:  [id number]                   PATIENT TYPE:  OIB   LOCATION:  6525                                 FACILITY:  [facility]   PHYSICIAN:  Tara C. Jernejcic, P.A.             DATE OF BIRTH:  11/22/1945   DATE OF ADMISSION:  07/19/2003  DATE OF DISCHARGE:  07/20/2003                                 DISCHARGE SUMMARY   ADDITIONAL PHYSICIAN:  [place] Bing, M.D.   PRIMARY CARE PHYSICIAN:  Scott A. Gerda Diss, M.D.   ADMISSION DIAGNOSES:  1. Unstable angina.  2. Abnormal Cardiolite, suggesting mid to distal anterior ischemia with     preserved EF.  3. Known coronary artery disease with prior stenting to the RCA.  4. Diabetes mellitus, requiring insulin.  5. Hypertension.  6. Hyperlipidemia.  7. Obesity.  8. Degenerative disc disease.  9. Anxiety and depression.  10.      History of GERD.   DISCHARGE DIAGNOSES:  1. Unstable angina, resolved, MI ruled out with negative enzymes.     a. Cardiac catheterization July 19, 2003 revealed multivessel CAD.        Successful PCI circ OM2 from 90 to 0% using Cipher.  Unsuccessful        attempt at PCI diagonal 1 with ongoing 100% occlusion.  EF preserved        at greater than 55%.  2. Abnormal Cardiolite, suggesting mid to distal anterior ischemia with     preserved EF.  3. Known coronary artery disease with prior stenting to the RCA.  4. Diabetes mellitus, requiring insulin.  5. Hypertension.  6. Hyperlipidemia.  7. Obesity.  8. Degenerative disc disease.  9. Anxiety and depression.  10.      History of GERD.   HISTORY OF PRESENT ILLNESS:  Lance [name] is a 65 year old white male with  known coronary disease and prior non-Q wave MI in 2003.  He was treated with  stenting to the LAD at that time.  He also had a subsequent intervention  with stenting to the RCA.  Preserved EF 55%.   The patient presented to [facility] Room with complaints of  left  anterior chest pains.  He felt the symptoms were similar to his prior  episodes of chest pain.  EKG showed sinus rhythm, with nonspecific ST  changes.  First cardiac enzymes were negative.  They recommended admitting  the patient to rule out myocardial infarction.  Continue aspirin, beta  blocker and statin therapy.  Lovenox initiated.  If enzymes were negative,  recommended Cardiolite.  If positive, recommended cardiac catheterization.   Cardiac enzymes turned out to be negative and an adenosine Cardiolite was  performed.  This revealed distal anterior defect with hypokinesis,  consistent with scar.  He also had mid to distal anterior lateral ischemia.  EF 53%.  Based on these findings, the patient was advised  to undergo cardiac  catheterization and was transferred to [facility] for the same.  __________was initiated.   COMPLICATIONS:  None.   CONSULTATIONS:  None.   COURSE IN HOSPITAL:  Lance [name] was transferred from [facility] in stable condition on July 19, 2003.   He underwent cardiac catheterization by Dr. Chales Abrahams, which revealed the  following:  A prior LAD stent was patent in the proximal mid section.  There  was a 40% mid lesion.  There was 100% diagonal one lesion.  The left main  had a ostia of 30% lesion.  The circumflex had a 100%  OM1 and a 90/70% OM2.  The OM3 was 30%.  RCA was 30% mid in stent restenosis and 60 to 70% ostial  PDA.  LV gram revealed an EF greater than 55% and no MR.  Dr. Chales Abrahams  performed successful intervention to the circle OM2, reducing a 90% lesion  to 0%, using a 3.0 x 23 Cipher stent.  Unsuccessful attempt at PCI of  diagonal 1 with residual 100% stenosis.  Integrilin was used during the  procedure.  Recommended Plavix for greater than six months and medical  management of the remaining coronary vessels.   On July 20, 2003, the patient remained stable.  Groin stable without  hematoma or bruit.   Once the  Integrilin drip is completed (total of 18 hours) the patient will  be ambulated.  If he remains hemodynamically stable and chest pain free, he  will be discharged to home.   Of note, a post procedure lab showed a platelet count of 110 and a  hemoglobin of 12.3.  His platelet count on July 17, 2003 was low at 126 and  this was prior to Integrilin therapy.   DISCHARGE MEDICATIONS:  1. Plavix 75 mg a day for at least six months.  2. ____________ 325 mg.  3. Actos 30 mg.  4. Toprol 100 mg a day.  5. Lipitor increased to 20 mg a day.  Will need to have a followup     cholesterol and LFT profile in six to eight weeks.  6. Continue Novolin 70/30, 45 units in the morning before breakfast, and     70/30, 30 units before supper through Sunday.  He may then restart     Glucophage 1000 mg b.i.d. and return to his regular insulin dosing of 40     units in the morning only.  7. Nitroglycerin as needed for chest pain.   ACTIVITY:  No strenuous activity, lifting of more than five pounds, or  driving for two days.  Patient can shower but should not soak in a tub or  swim for a week.   DIET:  Low fat, low salt, diabetic diet.   He is asked to call the office if any problems or questions.  He is also  asked to call the office on Monday to arrange a two to three week followup  appointment with Dr. Dietrich Pates or his physician's assistant at [phone].                                                Luan Moore, P.A.    TCJ/MEDQ  D:  07/20/2003  T:  07/23/2003  Job:  [id number]   cc:   [place] Bing, M.D.   Scott  A. Gerda Diss, M.D.  [address]  [place]  [place] [address]  Fax: [phone]   Veneda Melter, M.D.

## 2010-09-18 NOTE — Letter (Signed)
May 04, 2006     RE:  Lance [name], Lance [name]  MRN:  [id number]  /  DOB:  1946-04-23   Scott A. Gerda Diss, MD  [address]  [place], [place] [address]   Dear Lorin Picket:   Mr. Hixon returns to the office for continued assessment and treatment  of coronary disease and cardiovascular risk factors.  Since his last  visit, he has a decreased consumption of chewing tobacco substantially.  According to his figures, he is only using a third of what he was  previously chewing.  This apparently resulted in improvement of diabetic  control, along with your close management.  We spent most of the visit  discussing his family issues, including the recent departure of his  grandson, who he raised after his son's death.  Today is the anniversary  of that death as well.   Mr. Laker reports dietary indiscretions during the holidays.  He has  resolved to get back on a good diet and careful management of his  diabetes.   PHYSICAL EXAMINATION:  GENERAL:  On exam, a well-appearing tearful  gentleman in no acute distress.  The weight is 233, 8 pounds more than  in October.  VITAL SIGNS:  Blood pressure 130/80, heart rate 95 and regular,  respirations 16.  NECK:  No jugular venous distention; no carotid bruits.  LUNGS:  Clear.  CARDIAC:  Normal first and second heart sounds; fourth heart sound  present.  ABDOMEN:  Soft and nontender; no bruits; aortic pulsation not palpable.  EXTREMITIES:  No edema.   IMPRESSION:  Mr. Schwager is doing well from a cardiac standpoint.  Lipid  control was excellent when last assessed.  He is encouraged to continue  to taper use of tobacco products.  I have once again raised the issue of  Chantix, but he would prefer to do this on his own.  I will be glad to  see Mr. Bogden again in the office in 6 months.  Our electrophysiology  service is managing his defibrillator.    Sincerely,      [name], MD, [facility]  Electronically Signed    RMR/MedQ  DD: 05/04/2006   DT: 05/04/2006  Job #: [id number]

## 2010-09-18 NOTE — Cardiovascular Report (Signed)
NAME:  Lance [name], Lance [name]                          ACCOUNT NO.:  [id number]   MEDICAL RECORD NO.:  [id number]                   PATIENT TYPE:  INP   LOCATION:                                       FACILITY:  [facility]   PHYSICIAN:  Charlies Constable, M.D.                  DATE OF BIRTH:  1945-10-31   DATE OF PROCEDURE:  DATE OF DISCHARGE:  12/27/2001                              CARDIAC CATHETERIZATION   CLINICAL HISTORY:  The patient is 65 years old and had a history of a  previous cardiac catheterization and myocardial infarction, but no  intervention.  He was admitted with chest pain and ST changes consistent  with a non ST elevation infarction.  He also has diabetes and hypertension.  He had a diagnostic study performed several days earlier by Doylene Canning. Ladona Ridgel,  M.D. which showed 90% narrowing in the proximal LAD with involvement of a  five branch diagonal side branch which also had a 60% lesion.  There was  also 50-60% narrowing in the proximal LAD and 60-70% narrowing in the distal  right coronary artery and 50% narrowing in a marginal branch of the  circumflex artery.  After some debate we decided to treat him  percutaneously.   PROCEDURE:  The procedure was performed via the right femoral artery  utilizing an arterial sheath.  We just used a 6-French Q4 guiding catheter  with side holes.  The patient was given Angiomax bolus and infusion and was  given 300 mg of Plavix.  We first passed a high torque floppy wire down the  LAD and performed dilatation with a 2.5 x 20 mm Maverick with up to 8  atmospheres for 25 seconds.  We then wired the diagonal branch with a Luge  wire and passed a 1.5 Maverick Luge wire and dilated the diagonal branch.  We then followed this with a 2.0 x 20 mm CrossSail and performed two  inflations up to 12 atmospheres for one minute.  We then removed the wire  from the diagonal branch and placed a Cypher stent in the LAD across the  diagonal branch using a 3.0 x 33  mm Cypher.  We deployed this with two  inflations up to 15 atmospheres for 35 seconds.  We then post dilated with a  3.25 x 12 mm Quantum Maverick performing inflations up to 12 atmospheres for  35 seconds.  Repeat was then performed through the guiding catheter.  The  patient tolerated the procedure well and left the laboratory in satisfactory  condition.   RESULTS:  Initially the stenosis in LAD was estimated at 90%.  Following  stenting this improved to less than 10%.  The stenosis in the diagonal  branch of the LAD was initially 95% and following PTCA this improved to 50%.  The patient tolerated procedure well and left the laboratory in satisfactory  condition.  CONCLUSION:  Successful PCI of the left anterior descending, diagonal branch  stenosis with improvement in the diagonal stenosis from 95% to 50% with PTCA  and improvement in the LAD stenosis from 90% to less than 10% with stenting.   DISPOSITION:  The patient sent to unit for further observation.                                                Charlies Constable, M.D.    BB/MEDQ  D:  10/08/2002  T:  10/08/2002  Job:  [id number]   cc:   Rollene Rotunda, M.D.   Scott A. Gerda Diss, M.D.  [address], [address]  [place]  [place] [address]  Fax: [phone]   [place] Bing, M.D.

## 2010-09-18 NOTE — H&P (Signed)
[place]  Patient:    ZALYN, AMEND Visit Number: [id number] MRN: [id number]          Service Type: SUR Location: [id number] Attending Physician:  Danella Penton Adm. Date:  12/21/2000 Disc. Date: 12/22/2000                           History and Physical  CHIEF COMPLAINT:  The patient is a gentleman who has been complaining of neck pain and stiffness going to the shoulder, which is getting worse lately.  HISTORY OF PRESENT ILLNESS: The pain is worse in the morning than late in the afternoon.  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  He still has residual pain in the right leg and some in the left leg.  He denies any weakness in the upper extremities.  PAST MEDICAL HISTORY: Negative except for back fusion.  MEDICATIONS: He is taking some medications, the names of which he does not remember.  ALLERGIES: PENICILLIN.  REVIEW OF SYSTEMS: Positive for muscle pain and pain in the left leg and also in the neck.  FAMILY HISTORY: Negative.  SOCIAL HISTORY: The patient smokes.  He does not drink.  PHYSICAL EXAMINATION:  GENERAL: He came to my office limping from the right leg.  HEENT: Normal.  NECK: He is able to flex but extension produces pain going to the shoulder. No carotid bruits.  LUNGS: Clear.  CARDIAC: Heart sounds normal.  ABDOMEN: Normal.  EXTREMITIES: Normal pulses.  NEUROLOGIC: Mental status normal.  Cranial nerves normal.  Strength--he had normal deltoids, and can bend easily both biceps and both wrist extensors with normal triceps.  Lower extremities are normal.  Sensation is normal.  Reflexes symmetrical, with decrease at the right biceps.  DIAGNOSTIC DATA: An MRI shows a herniated disk at the level of 5-6 with a fragment an also at the level of 4-5 he has spondylosis and herniated disk with compromise of the C5  nerve root.  CLINICAL IMPRESSION:  1. C4-C5, C5-C6 herniated disk.  2. Status post lumbar fusion by orthopedic surgeon.  PLAN: The patient when seen by me was given conservative treatment.  The patient was not any better.  He came back complaining of worsening pain and we found weakness of the deltoid and biceps.  The patient is being admitted for anterior cervical diskectomy at the level of 4-5 and 5-6.  He knows about the risks such as infection, cerebrospinal fluid leak, worsening of pain, paralysis, damage to the vocal cord, damage to the esophagus, failure of the plate, bleeding, and need for further surgery. Attending Physician:  Danella Penton DD:  12/21/00 TD:  12/22/00 Job: [id number]

## 2010-09-18 NOTE — Letter (Signed)
August 23, 2006    Scott A. Gerda Diss, MD  [address], [address]  [place], [place] [address]   RE:  GREOGRY, Lance [name]  MRN:  [id number]  /  DOB:  Jul 30, 1945   Dear Lorin Picket:   Mr. Halladay came to see me today prior to planned left ankle surgery.  He  was not familiar with the details of the procedure to be performed.  He  is not even certain who his surgeon is.  He was told he has a bone chip  or a bone spur that needs to be removed.  He does not know whether  general anesthesia is planned.   He has been stable with respect to cardiac disease since his last  cardiac catheterization and intervention in 2005.  He had a stress test  that was negative for ischemia in October 2007.  Left ventricular  systolic function was preserved.  He suffered an episode of sudden  cardiac death, from which he was resuscitated when he was vacationing in  [place].  An AICD was placed at that time.   At the present time, he describes excessive fatigue.  Even short walks  leave him exhausted.  He does not have any chest discomfort.  He does  not have true dyspnea.   CURRENT MEDICATIONS:  1. Prevacid 30 mg b.i.d.  2. Aspirin 325 mg q.d.  3. Amlodipine 5 mg q.d.  4. Toprol 100 mg q.d.  5. Clopidogrel 75 mg q.d.  6. Torvastatin 80 mg q.d.  7. Fish oil 1000 mg q.d.  8. Lisinopril 2.5 mg q.d.  9. Metformin 1000 mg b.i.d.  10.Citalopram 40 mg q.d.  11.Nebumatone 500 mg b.i.d.  12.Lantus insulin per your instructions.  13.Humalog insulin per your instructions.  14.Flomax 0.4 mg q.d.  15.He uses hydrocodone p.r.n.  16.Alprazolam p.r.n.  17.Fexofenadine p.r.n.   PHYSICAL EXAMINATION:  GENERAL:  A proportionate gentleman, in no acute  distress.  VITAL SIGNS:  Weight 233 pounds, 4 pounds less than in January, blood  pressure 140/80, heart rate 80 and regular, respirations 16.  NECK:  No jugular venous distention.  No carotid bruits.  LUNGS:  Clear.  HEART:  Normal first and second heart sounds.  Minimal  systolic ejection  murmur.  ABDOMEN:  Soft, nontender.  No masses.  No organomegaly.  EXTREMITIES:  No edema.   Electrocardiogram:  Normal sinus rhythm, leftward axis, left atrial  abnormality, right ventricular conduction delay, delayed R-wave  progression.  When compared to a prior tracing of February 17, 2006, the  axis had shifted leftward.   IMPRESSION/RECOMMENDATIONS:  Mr. Mance has stable coronary artery  disease and a defibrillator that has never fired since implantation.  He  has preserved left ventricular systolic function.  No history of  congestive heart failure.  Accordingly, the proposed low-risk surgery  should be reasonably safe for him.  I suspect that clopidogrel will need  to be discontinued prior to his operation.  He has had a drug-eluting  stent, but this was nearly three years ago.  Although there is some risk  to discontinuing that medication, the risk is small.  The drug should be  resumed immediately postoperatively.   I will plan to see this gentleman again in July, as previously planned,  at which time we can evaluate his excessive fatigue, if it persists.    Sincerely,      [name], MD, [facility]  Electronically Signed    RMR/MedQ  DD: 08/23/2006  DT: 08/23/2006  Job #: [id number]   CC:    [facility]

## 2010-09-18 NOTE — Cardiovascular Report (Signed)
NAME:  Lance [name], Lance [name]                          ACCOUNT NO.:  [id number]   MEDICAL RECORD NO.:  [id number]                   PATIENT TYPE:  OIB   LOCATION:  6525                                 FACILITY:  [facility]   PHYSICIAN:  Veneda Melter, M.D. [facility]               DATE OF BIRTH:  [birth year]   DATE OF PROCEDURE:  01/31/2002  DATE OF DISCHARGE:  02/01/2002                              CARDIAC CATHETERIZATION   PROCEDURES PERFORMED:  1. Left heart catheterization.  2. Left ventriculogram.  3. Selective coronary angiography.  4. Primary stent placement in the distal right coronary artery.   DIAGNOSES:  1. Two-vessel coronary artery disease.  2. Normal left ventricular systolic function.  3. Exertional angina.   HISTORY:  The patient is a 65 year old white male with multiple  cardiovascular risk factors with known history of coronary artery disease  who has initially undergone percutaneous intervention with placement of  a  stent to the proximal LAD on December 26, 2001.  The patient has had  recurrence of exertional pain and underwent stress imaging study showing  ischemia in the inferolateral wall.  He presents for further assessment.   TECHNIQUE:  Informed consent was obtained, patient brought to the cardiac  catheterization lab, and a 6 French sheath was placed in the right femoral  artery using the modified Seldinger technique. A 6 [place] and JR4  catheters were then used to engage the left and right coronary arteries, and  selective angiography performed in various projections using manual  injections of contrast. A 6 French pigtail catheter was then advanced to the  left ventricle and left ventriculogram performed using power injections of  contrast.   FINDINGS:  1. Left main trunk:  Large caliber vessel.  There is an ostial narrowing of     30-40%.  2. LAD:  This begins as a large caliber vessel that extends to the apex and     provides a major first diagonal branch in the  proximal segment.  The LAD     has evidence of a previously placed stent in the proximal segment     extending to the mid section and encompassing the first diagonal branch.     This stent is widely patent with only mild disease of 10-15%.  The mid     LAD has further narrowings of 30% after the stent.  The distal LAD has     mild irregularities. The first diagonal branch arises from the mid     section of the stent and has a proximal narrowing of 70%.  There is then     a further narrowing of 90% in the mid section.  A small trivial second     diagonal branch is noted distally with mild disease.  3. Left circumflex artery:  Medium caliber vessel that provides two marginal     branches in the mid section.  The  AV circumflex has mild disease of 30%.     The first and second marginal branches both have moderate disease of 50%     in the proximal segment.  4. RCA:  Dominant. This is a large caliber vessel that provides the     posterior descending artery and two posterior ventricular branches in its     terminal segment. The right coronary artery has moderate disease of  30%     in the mid section.  There is then a circumferential narrowing of 70% in     the distal vessel prior to the bifurcation with the PDA and posterior     ventricular branches.   LEFT VENTRICULOGRAM:  Normal end-systolic and end-diastolic dimensions.  Overall, left ventricular function is well preserved, ejection fraction of  greater than 55%.  No mitral regurgitation.  LV  pressure 160/10, aortic is  160/100, LVEDP equals 20.   These findings were reviewed with the patient and we elected to proceed with  percutaneous intervention to the distal right coronary artery.  The 6 French  sheath in the right femoral artery was exchanged for a 7 [place] sheath.  The  patient was given heparin intravenously to maintain ACT to greater than 250  seconds. Initially a 7 [place] JR4 guide catheter was used to engage the  right  coronary artery. However, due to dilated aortic root this could not be  seated appropriately and a 7 [place] AL-1 catheter was introduced. This was  also somewhat shy at reaching the ostium.  A short extra-support wire could  not be passed successfully and a long balanced middle weight wire was  advanced within an end-hole catheter into the mid RCA. The distal vessel was  somewhat tortuous. We were finally able to negotiate the wire into the  distal PDA using the end-hole catheter.  This was then removed and a 3.0 x  11 mm over the wire BiodivYsio stent introduced with some difficulty.  This  was finally advanced into the distal RCA and carefully positioned and prior  to the bifurcation deployed at 14 atmospheres for 60 seconds. Repeat  angiography showed mild residual waste of 20-30% and a repeat inflation at  18 atmospheres for 30 seconds was performed.  Repeat angiography now showed  approximately 10-15% residual narrowing with the distal section of the stent  at the bifurcation.  There was no evidence of vessel damage or TIMI-3 flow  through the distal right coronary artery.  This was deemed an acceptable  result.  The guide catheter was then removed.  A 7 French Q4 guide catheter  was then used to engage the left main trunk and a brief attempt made to wire  the first diagonal branch. However, due to moderate angulation of the left  main bifurcation, and the bare stent in the proximal mid LAD, wire position  could not be confirmed via intraluminal unless I felt that passing a balloon  may potentially disrupt this stent.  We thus aborted this procedure.  Angiography showed no evidence of distal vessel damage.  The guide catheter  was then removed and the sheath secured in position. The patient was  transferred to the holding area in stable condition.   FINAL RESULTS:  Primary stent placed in the distal right coronary artery with reduction of 70% narrowing to 10% with placement of a 3.0 x  11 mm  BiodivYsio stent.   ASSESSMENT AND PLAN:  The patient is a 65 year old gentleman with two-vessel  coronary  artery disease and well preserved left ventricular function who has  undergone intervention to the proximal left anterior descending in the past  and now to the distal right coronary artery in an area that appears to be  ischemic.  He has residual disease in the first diagonal branch that can be  treated medically at this point. Should the patient have recurrence of  symptoms,  consideration given towards percutaneous intervention once the left anterior  descending stent has endothelized. He will be continued on Plavix for a  minimum of three months given his drug-eluting stent in the left anterior  descending.                                                 Veneda Melter, M.D. [facility]    NG/MEDQ  D:  01/31/2002  T:  02/04/2002  Job:  [id number]   cc:   King[place] Bing, MD [facility]  [address]  [place]  [place] [address]  Fax: 1   Scott A. Gerda Diss, M.D.

## 2010-09-18 NOTE — Op Note (Signed)
NAME:  Lance [name], Lance [name]                          ACCOUNT NO.:  [id number]   MEDICAL RECORD NO.:  [id number]                   PATIENT TYPE:  OIB   LOCATION:  2899                                 FACILITY:  [facility]   PHYSICIAN:  Duke Salvia, M.D.               DATE OF BIRTH:  Jul 15, 1945   DATE OF PROCEDURE:  11/20/2003  DATE OF DISCHARGE:  11/20/2003                                 OPERATIVE REPORT   PREOPERATIVE DIAGNOSIS:  Pocket hematoma.   POSTOPERATIVE DIAGNOSIS:  Pocket hematoma.   PROCEDURE:  Pocket revision.   Following obtaining informed consent, the patient was brought to the  electrophysiology laboratory and placed on the fluoroscopic table.  After  routine prep and drape, lidocaine was infiltrated along the line of the  previous incision, and the incision was opened and carried down to the area  of the device pocket using sharp dissection.  The pocket was opened.   The device was removed.  The hematoma was flushed out.  Oozing was  identified without any specific bleeders.  Great care was taken to make sure  that hemostasis was adequate.  The pocket was copiously irrigated with  antibiotic-containing saline solution.  The leads and pulse generator were  placed back into the pocket and secured to the prepectoral fascia.  The  wound was then closed in three layers in the normal fashion.  The patient's  St. [name] Atlas model [phone] defibrillator was reassessed at this point.  The  bipolar R wave was 11.3 millivolts with a pacing impedance of 415 and  threshold of 1 volt with a P wave of greater than 3 millivolts, pacing  threshold of 1.25 volts and impedance of 550.  The high voltage impedance  was 37 ohms.  The patient tolerated the procedure without apparent  complication.                                               Duke Salvia, M.D.    SCK/MEDQ  D:  12/24/2003  T:  12/25/2003  Job:  [id number]

## 2010-09-18 NOTE — Op Note (Signed)
[place]  Patient:    GURJOT, BRISCO Visit Number: [id number] MRN: [id number]          Service Type: SUR Location: [id number] Attending Physician:  Danella Penton Proc. Date: 12/21/00 Adm. Date:  12/21/2000 Disc. Date: 12/22/2000                             Operative Report  PREOPERATIVE DIAGNOSIS:  C4-C5, C5-C6 herniated disk.  POSTOPERATIVE DIAGNOSIS:  C4-5, C5-C6 herniated disk.  OPERATION PERFORMED:  Anterior 4-5, 5-6 diskectomy, decompression of the spinal cord and foraminotomies, interbody fusion using bone graft and a plate.  Microscope.  SURGEON:  Tanya Nones. Jeral Fruit, M.D.  ASSISTANTMena Goes. Franky Macho, M.D.  ANESTHESIA:  INDICATIONS FOR PROCEDURE:  The patient has been admitted because of neck pain with weakening of the biceps.  X-ray shows herniated disk at the level of 4-5, 5-6.  Surgery was advised and the patient knew of the risks which were explained in the history and physical.  DESCRIPTION OF PROCEDURE:  The patient was taken to the operating room and the left side of the neck was prepped with Betadine.  A transverse incision through the skin and platysma was carried out.  X-ray showed that indeed we were at the level of 4-5.  We found that there was anterior osteophyte at the level of 5-6 and 6-7.  They were removed.  We entered the disk space at the level of 4-5 and we brought the microscope into the area.  Indeed, toward the left side, there was an opening in the posterior ligament with some free fragment going to the left side.  Total diskectomy, decompression of the spinal cord as well as the foraminotomy was accomplished.  The same procedure was done at the level of the 5-6 where we found mostly disk going to the right side.  At the end, we had good decompression.  Using the drill, we drilled the end plates.  Both C5 and C6 nerve root were wide open.  Then  two pieces of bone graft of 7 mm were inserted  followed by a plate.  We used six screws. Lateral C-spine showed good position of the bone graft and the plate.  From then on the area was investigated, the carotid, esophagus and trachea were normal.  Then the wound was closed with Vicryl and Steri-Strips.  The patient did well. Attending Physician:  Danella Penton DD:  12/21/00 TD:  12/22/00 Job: [id number]

## 2010-09-18 NOTE — Procedures (Signed)
   NAME:  NOLYN, SWAB                          ACCOUNT NO.:  [id number]   MEDICAL RECORD NO.:  [id number]                   PATIENT TYPE:  INP   LOCATION:  6522                                 FACILITY:  [facility]   PHYSICIAN:  Donna Bernard, M.D.             DATE OF BIRTH:  1946-03-19   DATE OF PROCEDURE:  12/21/2001  DATE OF DISCHARGE:  12/27/2001                                EKG INTERPRETATION   At 20:12, EKG reveals normal sinus rhythm with possibly subtle ST elevation  in leads I and aVL compared to prior baseline; however, this is very minimal  change and it could well be within normal limits.                                               Donna Bernard, M.D.    Karie Chimera  D:  01/31/2002  T:  02/05/2002  Job:  [id number]

## 2010-09-18 NOTE — Discharge Summary (Signed)
NAME:  Lance [name], Lance [name]                          ACCOUNT NO.:  [id number]   MEDICAL RECORD NO.:  [id number]                   PATIENT TYPE:  INP   LOCATION:  2014                                 FACILITY:  [facility]   PHYSICIAN:  [place] Bing, M.D.               DATE OF BIRTH:  Feb 24, 1946   DATE OF ADMISSION:  11/24/2003  DATE OF DISCHARGE:  11/26/2003                                 DISCHARGE SUMMARY   DISCHARGE DIAGNOSES:  1. Admitted with recurrent chest pain, ruled out for myocardial infarction.  2. ICD pocket hematoma/stable.  3. Bronchitis unresponsive to Levaquin, now discharged on Biaxin for an 11-     day therapy.   SECONDARY DIAGNOSES:  1. History of cardiopulmonary arrest in [place] on November 05, 2003, with     AED resuscitation for ventricular fibrillation/ventricular tachycardia     status post left heart catheterization November 05, 2003, with the following:     a. Left ventricular ejection fraction 50-55%.     b. Left main free of disease.     c. First obtuse marginal stent with no in stent restenosis.     d. Carmin [place] marginal-2 with a 40-50% stenosis.     e. Left anterior descending stent at the midpoint with 20-30% in stent        restenosis with a 30-40% stenosis after the stent.  The distal left        anterior descending had no obvious disease.     f. Small diagonal with moderate disease.     g. Right coronary artery 30-40% mid right coronary artery stenosis.  2. Electrophysiology study November 07, 2003, implantation of St. [name] medical     ICD, model [place], serial [phone].  3. History of diabetes requiring insulin.  4. Hypertension.  5. Hyperlipidemia.  6. Obesity.  7. Degenerative joint disease.  8. Anxiety/depression.  9. Gastroesophageal reflux disease.   PROCEDURES:  No procedures this admission.   DISCHARGE DISPOSITION:  [name] is ready for discharge on hospital  day #3.  He has been ruled out for myocardial infarction.  He has not had  recurrent  chest pain.  He has been seen by Dr. Sherryl Manges to assess  recurrent hematoma at the pacer pocket.  This seems stable and he will have  close followup as an outpatient.  He has also been maintained on Levaquin  for his exacerbation of chronic bronchitis and this will be adjusted to  Biaxin on discharge.  He has been ruled out for myocardial infarction.  He  had blood cultures x2 which are negative so far.  His lipid studies show a  cholesterol of 137, triglyceride 153, HDL cholesterol 34 and LDL cholesterol  72.  His Lipitor had been recently advanced from 10-20 mg daily.  [name]  goes home with the following medications:  1. Biaxin 500 mg twice daily for 11 day therapy.  2. Imdur 30 mg daily, keeps coronaries open.  3. Enteric-coated aspirin 325 mg daily.  4. Plavix 75 mg daily.  5. Metoprolol 100 mg twice daily.  6. Lipitor 20 mg daily at bedtime.  7. Actos 30 mg daily.  8. Novolin 70/30 40 units in the morning.  9. Glucophage 1000 mg twice daily.  10.      Norvasc 5 mg daily (that is a new medication for coronary spasm).  11.      Nitroglycerin one tablet under the tongue every five minutes x3     doses as needed for chest pain.  12.      Also, he is to take Robitussin as needed for cough.   ACTIVITY:  As tolerated.   DISCHARGE DIET:  Low sodium, low cholesterol, ADA diet.   FOLLOW UP:  He has three followup appointments:  1. With Dr. Lilyan Punt Thursday, November 28, 2003, at 3:00.  2. He also sees Dr. Graciela Husbands Wednesday, December 04, 2003, at 9:15 a.m. at [facility]     [facility], [place].  3. He will see Dr. Dietrich Pates in the [facility]     Friday, December 13, 2003, at 2:00 p.m.   BRIEF HISTORY:  [name] is a 65 year old male.  He has known  coronary artery disease and has had a complicated history in the last 4-5  months.  Of note, he presented to [facility] in [place]  while on vacation November 05, 2003, after AED resuscitation  for Vfib/Vtach.  At  that time he underwent repeat left heart catheterization and also had  implantation of a cardioverter-defibrillator. He now presents November 24, 2003,  with two separate episodes of chest pain.  His first chest pain cleared when  he took nitroglycerin spray and felt so much better that he was able to walk  to the mailbox and back.  Then in the evening of November 24, 2003, once again  had recurrent chest pain, took nitroglycerin spray and felt better.  In  about 45 minutes had to require two additional administrations of the spray.  With these episodes of chest pain he feels short of breath and stuffy  feeling.  The pain is a pressure.  He is not having syncope with this, his  ICD has not fired.  He does also note increased swelling at the ICD pocket.  He is status post evacuation of this pocket at [facility] earlier  this month, the week of November 18, 2003, but he says that swelling in the  pocket has once again returned.   HOSPITAL COURSE:  With recurrent chest pain in a patient with this  complicated history, the patient was admitted.  He underwent serial cardiac  enzymes which ruled out for myocardial infarction.  He also had blood  cultures drawn x2 which were negative.  His serum electrolytes on admission  were sodium 137, potassium 4.1, chloride 104, _________ 26, glucose 249, BUN  is 13, creatinine 1.1.  He was seen in the hospital by Dr. Graciela Husbands who  assessed the pacer pocket and pronounced that at the present time he was  stable but he would require close followup  which has been planned for December 04, 2003. He also recommended Imdur 30 mg  as well as Norvasc 5 in case coronary spasm was a cause of recurrent  ischemia.  The patient has been chest pain free during his hospitalization.  He goes home with the medications and followup as  dictated.     Maple Mirza, P.A.                    [place] Bing, M.D.    GM/MEDQ  D:  11/26/2003  T:  11/26/2003   Job:  [id number]   cc:   Lorin Picket A. Gerda Diss, M.D.  [address], [address]  [place]  [place] [address]  Fax: [phone]   [place] Bing, M.D.  [facility]  [address]  [place], [place] [address]   Duke Salvia, M.D.

## 2010-09-18 NOTE — Discharge Summary (Signed)
NAME:  Lance [name], Lance [name]                          ACCOUNT NO.:  [id number]   MEDICAL RECORD NO.:  [id number]                   PATIENT TYPE:  INP   LOCATION:  6522                                 FACILITY:  [facility]   PHYSICIAN:  Rollene Rotunda, M.D. [facility]            DATE OF BIRTH:  1946/01/01   DATE OF ADMISSION:  12/21/2001  DATE OF DISCHARGE:  12/27/2001                                 DISCHARGE SUMMARY   DISCHARGE DIAGNOSES:  1. Non-Q wave myocardial infarction.  2. Diabetes mellitus.  3. Hypertension.  4. Coronary artery disease.  5. Depression/anxiety.  6. Herniated disk.  7. History of renal calculi.  8. Gastroesophageal reflux disease.   HISTORY OF PRESENT ILLNESS:  On the morning of admission, the patient  presented to the office of Scott A. Luking, M.D. describing chest pain of  the prior evening.  He was subsequently sent to the emergency room at [facility] where he was found to have elevated troponins.  He  was then transferred to [facility] for further evaluation.  The  patient was seen and admitted by Rollene Rotunda, M.D. [facility].  Dr. Antoine Poche  felt the patient had a non-Q wave myocardial infarction.  Later that day,  the patient was taken to the catheterization lab by Doylene Canning. Ladona Ridgel, M.D.  [facility].   CATHETERIZATION RESULTS:  1. Left ventriculography; no wall motion abnormalities, preserved left     ventricular function.  2. Left main coronary artery; 20 to 30% ostial stenosis.  3. Left anterior descending coronary artery; ulcerated 50 to 60% stenosis     followed by a 90 to 95% stenosis in the midvessel at the junction of the     first diagonal branch.  Of note, the diagonal had a 60% stenosis.  4. Left circumflex; two large obtuse marginal branches; the first had a very     tortuous path and it also had a 50% proximal stenosis.  5. Right coronary artery; dominant, 60 to 70% stenosis distally.   HOSPITAL COURSE:  Dr. Ladona Ridgel planned to review  the case with colleagues and  decide on intervention versus bypass surgery.  Over the next couple of days,  the patient did well and had no further pain.   On August 25, the patient was seen by Dr. Antoine Poche.  The case had been  reviewed with Bruce R. Juanda Chance, M.D. [facility] who felt that percutaneous coronary  intervention was an option.  They also felt that there was approximately a  30% chance for future need of total revascularization within the next year.  After discussion with the patient and his family, percutaneous approach was  chosen.  Dr. Antoine Poche also noted that the patient's triglycerides were  elevated and recommended starting fish oil as an outpatient along with an  exercise program.   On August 26, the patient was taken back to the catheterization lab by Smitty Cords  R. Juanda Chance, M.D. [facility].  Dr. Juanda Chance then performed angioplasty on the lesion in  the mid left anterior descending coronary artery and placed a Cypher stent.   The next day, the patient was doing well, had no further chest pain, and is  felt to be stable.  He was thought to be ready for discharge.   DISCHARGE MEDICATIONS:  1. Enteric-coated aspirin 325 mg q.d.  2. Lipitor 20 mg q.h.s.  3. Prevacid 30 mg q.d.  4. Lexapro 10 mg q.d.  5. Glyburide 5 mg q.d.  6. Insulin as previously taken.  7. Actos 50 mg q.d.  8. Toprol XL 100 mg (4 x 25 mg) q.d.  9. Plavix 75 mg q.d. for approximately three months.  10.      Sublingual nitroglycerin as needed.  11.      Glucophage 1000 mg b.i.d. (to be restarted on August 29).   LABORATORY DATA:  Sodium 138, potassium 4.2, chloride 102, CO2 31, BUN 12,  creatinine 1.0, glucose 143.  White blood cell count 7.5, hemoglobin 13.0,  hematocrit 38.0, platelets 152.  Hemoglobin A1C 9.2.  AST 23, ALT 30,  alkaline phosphatase 69, total bilirubin 0.4.  Total cholesterol 173,  triglycerides 254, HDL 36, LDL 86, total cholesterol HDL ratio 4.8.   Chest x-ray on August 18 showed no active  disease.  Electrocardiogram showed  sinus rhythm at 65, PR interval 174, QRS 92, QTC 420, axis 12.   DISCHARGE INSTRUCTIONS:  The patient is to avoid driving, heavy lifting, or  tub baths for two days.  The patient will remain off work until seen in the  office.  He is to follow a low fat, diabetic diet.  He is to watch the  catheterization site for any pain, bleeding, or swelling and to call the  [place] office for any of these problems.  He is to discontinue taking  Norvasc.  He is to follow up with Dr. Dietrich Pates and the P.A. on September 11,  at 11 a.m.  He is to follow up with [name] as needed or scheduled.      Annett Fabian, P.A. [facility]                  Rollene Rotunda, M.D. [facility]    CKM/MEDQ  D:  12/27/2001  T:  12/29/2001  Job:  [id number]   cc:   [name], M.D. [facility]  [address]  [place]  [place] [address]  Fax: [phone]   Scott A. Gerda Diss, M.D.

## 2010-09-18 NOTE — Assessment & Plan Note (Signed)
 HEALTHCARE                         ELECTROPHYSIOLOGY OFFICE NOTE   NAME:Lance [name], Lance [name]                       MRN:          [id number]  DATE:05/13/2006                            DOB:          10/13/1945    EP CLINIC NOTE   Mr. Folkert returns today for followup.  He is a very pleasant, middle-  aged male with an ischemic cardiomyopathy and status post VT/VF arrest  who is status post ICD insertion.  He returns today for followup.  He  denies any chest pain or shortness of breath.  He admits to some dietary  indiscretion during the holidays.  He has difficulty secondary to the  loss of his son who died 4 years ago of uncertain causes at a young age.   PHYSICAL EXAM:  He is a pleasant, middle-age man in no acute distress.  The blood pressure today was 140/88.  Pulse 86 and regular.  Respirations were 18.  The weight was 237 pounds.  NECK:  No jugular venous distention.  There was no thyromegaly.  Carotids are 2+ and symmetric.  LUNGS:  Clear bilaterally to auscultation.  There are no wheezes, rales,  or rhonchi.  CARDIOVASCULAR:  Regular rate and rhythm with normal S1 and S2.  There  was a soft S4 gallop present.  EXTREMITIES:  No edema.   Interrogation of his defibrillator demonstrates a [name] Atlas V-243  with P-waves greater than 3 and R-waves of 9.  The impedence 490 in the  atrium and 360 in the ventricle.  Threshold of 1 V at 0.5 in the atrium  and 0.75 at 0.5 in the ventricle.  Battery voltage is 3.15 V.  There  were no intercurrent ICD therapies.   IMPRESSION:  1. Ischemic cardiomyopathy.  2. Status post ventricular tachycardia/ventricular fibrillation      arrest.  3. The patient implantable cardioverter defibrillator insertion.  4. Diabetes.   DISCUSSION:  Overall, Mr. Mentzer is stable.  His coronary disease is  stable.  His defibrillator is working normally.  We will see him back in  the office in 1 year and do house call every 3  months.     Doylene Canning. Ladona Ridgel, MD  Electronically Signed    GWT/MedQ  DD: 05/13/2006  DT: 05/13/2006  Job #: [id number]

## 2010-09-18 NOTE — Op Note (Signed)
NAME:  Lance [name], Lance [name]                          ACCOUNT NO.:  [id number]   MEDICAL RECORD NO.:  [id number]                   PATIENT TYPE:  INP   LOCATION:  6522                                 FACILITY:  [facility]   PHYSICIAN:  Doylene Canning. Ladona Ridgel, M.D. [facility]           DATE OF BIRTH:  10/01/1945   DATE OF PROCEDURE:  12/22/2001  DATE OF DISCHARGE:                                 OPERATIVE REPORT   PROCEDURE:  Left heart catheterization with coronary angiography and left  ventriculography.   INDICATIONS:  Non-Q-wave myocardial infarction in a patient with history of  diabetes.   INTRODUCTION:  The patient is a very pleasant 65 year old man with a 15-year  history of diabetes, who presented to the hospital with a non-Q-wave  myocardial infarction.  He is now referred for left heart catheterization.   DESCRIPTION OF PROCEDURE:  After informed consent was obtained, the patient  was taken to the diagnostic catheterization lab in a fasted state.  After  the usual preparation and draping, 1 mg of midazolam was given for sedation.  The left Judkins catheter was inserted percutaneously by way of the right  femoral vein and advanced into the left main coronary artery.  Coronary  angiography of the left main system was then carried out.  The left Judkins  catheter was removed and the right Judkins catheter was inserted into the  right coronary artery and coronary angiography of the right coronary system  was carried out.  Next the right Judkins catheter was removed and the  pigtail catheter was inserted retrograde across the aortic valve into the  left ventricle.  Left ventriculography in the RAO projection was carried out  with a total of 30 cc of contrast delivered at a rate of 12 cc/sec.  At this  point the catheter was secured in place and the patient was returned to his  room in satisfactory condition.   COMPLICATIONS:  There were no immediate procedural complications.   RESULTS:  1.  HEMODYNAMICS:  LV pressure was 105/3, the aortic pressure 108/66.   1. LEFT VENTRICULOGRAPHY:  Left ventriculography performed in the RAO     projection with a total of 30 cc of contrast delivered at 12 cc/sec. was     carried out.  This demonstrated what appeared to be preserved LV systolic     function (post-PBCB).   1. CORONARY ANGIOGRAPHY:  The left main coronary artery gave rise to the     left circumflex and left anterior descending coronary artery.  The left     main coronary artery appeared to have no critical obstruction.  I could     not exclude a 20-30% ostial stenosis.  The proximal coronary artery had a     50-60% ulcerated stenosis.  The mid-coronary artery at the junction of     the first diagonal branch had a 90-95% stenosis.  The diagonal had a 60%  stenosis.  The left circumflex gave rise to two large obtuse marginal     branches.  The first had a very tortuous path and appeared to have a 50%     stenosis in the proximal portion.  Because of overlap of the other     arteries, it was very difficult to see this vessel in the proximal     portion.  The right coronary artery was a dominant vessel.  It had a 60-     70% stenosis in the distal portion, giving rise to a PDA and three large     posterolateral branches.   CONCLUSION:  This study demonstrates critical mid-left anterior descending  stenosis (90-95%) with a 50-60% ulcerated proximal stenosis.  In addition,  there is a 60-70% distal right coronary artery stenosis.  The left  ventricular function was preserved on the post-PBCB.    RECOMMENDATIONS:  Will discuss the feasibility of high-risk percutaneous  intervention versus bypass surgery with the patient and my interventional  colleagues.                                               Doylene Canning. Ladona Ridgel, M.D. [facility]    GWT/MEDQ  D:  12/22/2001  T:  12/26/2001  Job:  [id number]   cc:   Lorin Picket A. Gerda Diss, M.D.   [name], M.D. [facility]  [address]   [place]  [place] [address]  Fax: 1

## 2010-09-18 NOTE — Discharge Summary (Signed)
NAME:  Lance [name], Lance [name]                          ACCOUNT NO.:  [id number]   MEDICAL RECORD NO.:  [id number]                   PATIENT TYPE:  INP   LOCATION:  IC03                                 FACILITY:  [facility]   PHYSICIAN:  Scott A. Gerda Diss, M.D.               DATE OF BIRTH:  1946-04-24   DATE OF ADMISSION:  07/17/2003  DATE OF DISCHARGE:  07/19/2003                                 DISCHARGE SUMMARY   DISCHARGE DIAGNOSES:  1. Chest pain.  2. Abnormal Cardiolite.   HOSPITAL COURSE:  The patient was admitted with chest discomfort, has  history of diabetes, coronary artery disease, was admitted in because of  atypical chest pain.  A Cardiolite was done which was not totally normal.  It was felt best for the patient to progress toward having a catheterization  done and that was arranged, and the patient was sent on March 18 for  catheterization.     ___________________________________________                                         Jonna Coup Gerda Diss, M.D.   Linus Orn  D:  08/05/2003  T:  08/05/2003  Job:  [id number]

## 2010-09-18 NOTE — Group Therapy Note (Signed)
NAME:  Lance [name], Lance [name]                          ACCOUNT NO.:  [id number]   MEDICAL RECORD NO.:  [id number]                   PATIENT TYPE:  INP   LOCATION:  IC03                                 FACILITY:  [facility]   PHYSICIAN:  Scott A. Gerda Diss, M.D.               DATE OF BIRTH:  1946-01-26   DATE OF PROCEDURE:  07/18/2003  DATE OF DISCHARGE:                                   PROGRESS NOTE   SUBJECTIVE:  Still feeling some small amount of chest discomfort,  substernal, but denies nausea, fevers, chills.   EXAMINATION:  Lungs are clear, heart is regular, enzymes are negative.   Of note, medication list which was not included on the H&P:  1. Aspirin 325 mg daily.  2. Vicodin one q.4h. p.r.n.  3. Metformin 1000 mg b.i.d.  4. Actos 30 mg daily.  5. Lipitor 20 mg daily (could not tolerate 40).  6. Prevacid 30 mg daily.  7. Toprol-XL 100 mg daily - recently ran out a few days ago.  8. Insulin 70/30 45 units in the morning, 30 units in the evening.  9. Xanax 0.25 mg b.i.d. p.r.n.   PLAN:  Await the adenosine Cardiolite study.  If this is negative will most  likely bump up his Prevacid to being b.i.d.  If he has ongoing troubles he  may well need to have an EGD done.  Hold off on any CT scan of the chest.  I  really do not feel he has any pulmonary embolus or things along that line.      ___________________________________________                                            Jonna Coup. Gerda Diss, M.D.   SAL/MEDQ  D:  07/18/2003  T:  07/18/2003  Job:  [id number]

## 2010-09-18 NOTE — Discharge Summary (Signed)
NAME:  Lance [name], Lance [name]                          ACCOUNT NO.:  [id number]   MEDICAL RECORD NO.:  [id number]                   PATIENT TYPE:  OIB   LOCATION:  6525                                 FACILITY:  [facility]   PHYSICIAN:  Veneda Melter, M.D. [facility]               DATE OF BIRTH:  1946/02/17   DATE OF ADMISSION:  01/31/2002  DATE OF DISCHARGE:  02/01/2002                           DISCHARGE SUMMARY - REFERRING   HISTORY OF PRESENT ILLNESS:  The patient is a 65 year old white male who  recently suffered a non-Q-wave myocardial infarction, treated with an  elective angioplasty and stenting of the mid-LAD by Dr. Everardo Beals. Brodie in  August 2003.  He had a residual 20%-30% ostial left main, a 50%-60%  ulcerated LAD, a 60% diagonal, two large OM's, and a 50% proximal stenosis  in the OM-I, and a 60%-70% RCA.  Initially bypass surgery was considered;  however, Dr. Juanda Chance felt that he would be a candidate for an angioplasty and  stenting to the LAD.  After he was discharged, he had an episode of  discomfort while walking, which was relieved with two sublingual  nitroglycerin.  He was initially scheduled to have a plate removed from a  cervical disk; however, this was postponed due to his non-Q-wave myocardial  infarction.  He wants this surgery done as soon as possible, but because of  the recent episode of chest discomfort, a stress Cardiolite was ordered.  This showed a mildly dilated left ventricle with mild systolic dysfunction.  No stress electrocardiogram abnormalities were induced but there was  anterior apical ischemia that was persistent and suggestive of infarction.  There was mixed infarction and ischemia in the inferolateral region, and  borderline transient ischemic dilatation, consistent with significant stress-  induced ischemia.  He presented to the office on January 30, 2002, to  follow up on his Cardiolite.  He states that the preceding Thursday he was  lying in bed and had an  episode of chest discomfort and shortness of breath  that lasted for 10 minutes and eased with one sublingual nitroglycerin.  He  said it is similar to his prior chest discomfort, but this is the first  episode he has had at rest.  It was noted that he had quit smoking.  He was  admitted for a cardiac catheterization.   PAST MEDICAL HISTORY:  1. Notable for hypertension.  2. Non-insulin-dependent diabetes mellitus (prior insulin-controlled, but he     has been off of his insulin since his myocardial infarction).  3. Anxiety and depression.  4. Herniated disk with a slipped plate that needs to be removed.  5. Gastroesophageal reflux disease.  6. Renal calculi.  7. Obesity.  8. He just quit smoking.  He continues chewing tobacco.   LABORATORY DATA:  Pre-admission H&H 13.0 and 37.9, normal indices, platelets  118, wbc's 4.2.  Sodium 139, potassium 4.0, BUN 14, creatinine  0.9.  Normal  liver function tests.  Glucose slightly elevated at 149.  PT 12.4, PTT 28.  ELECTROCARDIOGRAM:  Shows normal sinus rhythm, nonspecific ST-T wave  changes.  Post-procedure the H&H was 12.1 and 34.9, platelets 126, wbc's 4.0.  Sodium  137, potassium 4.0, BUN 11, creatinine 0.9, glucose 158.  CK x2 post-  procedure were negative.   HOSPITAL COURSE:  The patient presented for outpatient cardiac  catheterization.  This was performed by Dr. Veneda Melter on January 31, 2002.  According to Dr. Urban Gibson notes, he has an ostial 30%-40% left main, patent  proximal LAD stent, a 90% diagonal-I.  The circumflex had two OM's.  He had  a 50% OM-I and OM-II.  He has a mid-30% RCA and a 70% distal just prior to  the PDA.  The 55% stenting was performed to the distal RCA, reducing this  lesion from 70% to 0%.  Post-sheath removal and bedrest, he was ambulating  without difficulty.  Just prior to sheath removal, it was noted that he  complained of chest heaviness; however, this was relieved with oxygen.  He  had another  episode of chest discomfort, for which he received a sublingual  nitroglycerin.   DISPOSITION:  On the morning of February 01, 2002, after a review by Dr. Madolyn Frieze. Crenshaw, it was felt that he could be discharged home.   DISCHARGE DIAGNOSES:  1. Unstable angina.  2. Positive stress Cardiolite, previously described.  3. Hyperglycemia.  4. Previous history of insulin-dependent diabetes mellitus; however, at this     time he is on oral hypoglycemics.  5. History of hypertension.  6. Hyperlipidemia.  7. Herniated disk with slipped plate.  8. Obesity.   DISCHARGE MEDICATIONS:  1. Plavix 75 mg q.d. for one more month.  2. Coated aspirin 325 mg q.d.  3. Norvasc 5 mg q.d.  4. Actos 30 mg q.d.  5. Doxycycline 100 mg b.i.d.  6. Glyburide 5 mg q.d.  7. Toprol XL 100 mg q.d.  8. Lipitor 20 mg q.h.s.  9. Effexor XR 375 mg q.d.  10.      Flomax 0.4 mg q.d.  11.      Sublingual nitroglycerin p.r.n.  12.      He was asked to resume his Glucophage 1000 mg b.i.d. on Saturday     February 03, 2002.   INSTRUCTIONS:  He was advised no lifting, driving, sexual activity or heavy  exertion x2 days.  If he has any problems with his cardiac catheterization  site, he is asked to call.  He was advised no surgery in regards to his  slipped plate for at least 30 days.   DIET:  To maintain a low-salt, low-fat, low-cholesterol, ADA diet.    FOLLOW UP:  He will see Dr. Molly Maduro Rothbart's P.A. in the office on Tuesday,  February 13, 2002, at 1 p.m.  Dr. Jens Som noted in the chart that the  patient may benefit from an ACE inhibitor.  He feels that this can be  decided in the office.  He also needs to be re-established in cardiac  rehab  with follow up on cardiac risk factor modification (i.e. tobacco use and  glycemic control).      Joellyn Rued, P.A.                        Veneda Melter, M.D. [facility]    EW/MEDQ  D:  02/01/2002  T:  02/05/2002  Job:  [id number]  cc:    Bing, MD [facility]  [address]  [place]  [place] [address]  Fax: 1   Lilyan Punt, M.D.

## 2010-09-18 NOTE — Procedures (Signed)
NAME:  MISTY, RAGO                          ACCOUNT NO.:  [id number]   MEDICAL RECORD NO.:  [id number]                   PATIENT TYPE:  OUT   LOCATION:  RESP                                 FACILITY:  [facility]   PHYSICIAN:  Edward L. Juanetta Gosling, M.D.             DATE OF BIRTH:  11/01/1945   DATE OF PROCEDURE:  DATE OF DISCHARGE:                              PULMONARY FUNCTION TEST   FINDINGS:  1. Spirometry shows airflow obstruction at the level of the smaller airways.  2. Lung volumes are normal with a suggestion of air trapping.  3. DLCO is mildly reduced.  4. Arterial blood gasses are normal.      ___________________________________________                                            Oneal Deputy. Juanetta Gosling, M.D.   ELH/MEDQ  D:  12/26/2003  T:  12/26/2003  Job:  [id number]   cc:   Lorin Picket A. Gerda Diss, M.D.  [address], [address]  [place]  [place] [address]  Fax: [phone]

## 2010-09-25 ENCOUNTER — Encounter: Payer: Self-pay | Admitting: Cardiology

## 2010-10-03 ENCOUNTER — Emergency Department (HOSPITAL_COMMUNITY)
Admission: EM | Admit: 2010-10-03 | Discharge: 2010-10-03 | Disposition: A | Discharge status: Home / Self Care | Payer: Medicare Other | Attending: Emergency Medicine | Admitting: Emergency Medicine

## 2010-10-03 ENCOUNTER — Emergency Department (HOSPITAL_COMMUNITY): Payer: Medicare Other

## 2010-10-03 DIAGNOSIS — M79609 Pain in unspecified limb: Secondary | ICD-10-CM | POA: Insufficient documentation

## 2010-10-03 DIAGNOSIS — S92919A Unspecified fracture of unspecified toe(s), initial encounter for closed fracture: Secondary | ICD-10-CM | POA: Insufficient documentation

## 2010-10-03 DIAGNOSIS — I10 Essential (primary) hypertension: Secondary | ICD-10-CM | POA: Insufficient documentation

## 2010-10-03 DIAGNOSIS — Z950 Presence of cardiac pacemaker: Secondary | ICD-10-CM | POA: Insufficient documentation

## 2010-10-03 DIAGNOSIS — Z794 Long term (current) use of insulin: Secondary | ICD-10-CM | POA: Insufficient documentation

## 2010-10-03 DIAGNOSIS — IMO0002 Reserved for concepts with insufficient information to code with codable children: Secondary | ICD-10-CM | POA: Insufficient documentation

## 2010-10-03 DIAGNOSIS — Z79899 Other long term (current) drug therapy: Secondary | ICD-10-CM | POA: Insufficient documentation

## 2010-10-03 DIAGNOSIS — E119 Type 2 diabetes mellitus without complications: Secondary | ICD-10-CM | POA: Insufficient documentation

## 2010-10-07 ENCOUNTER — Ambulatory Visit (INDEPENDENT_AMBULATORY_CARE_PROVIDER_SITE_OTHER): Payer: Medicare Other | Admitting: Cardiology

## 2010-10-07 ENCOUNTER — Encounter: Payer: Self-pay | Admitting: Cardiology

## 2010-10-07 VITALS — BP 153/98 | HR 104 | Ht 69.0 in | Wt 223.0 lb

## 2010-10-07 DIAGNOSIS — I951 Orthostatic hypotension: Secondary | ICD-10-CM

## 2010-10-07 DIAGNOSIS — I2510 Atherosclerotic heart disease of native coronary artery without angina pectoris: Secondary | ICD-10-CM

## 2010-10-07 DIAGNOSIS — I10 Essential (primary) hypertension: Secondary | ICD-10-CM

## 2010-10-07 MED ORDER — METOPROLOL TARTRATE 50 MG PO TABS: ORAL_TABLET | ORAL | Status: DC
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
## 2010-10-07 NOTE — Patient Instructions (Signed)
Your physician has recommended you make the following change in your medication: INCREASE Lopressor to 75 mg twice a day ( 1 1/2 tablets twice a day) Your physician recommends that you schedule a follow-up appointment in: 6 months with Dr. Daleen Squibb

## 2010-10-07 NOTE — Assessment & Plan Note (Signed)
Improved

## 2010-10-07 NOTE — Assessment & Plan Note (Signed)
Stable. Continue current treatment but increase Lopressor to 75 mg twice a day to decrease his resting heart rate.

## 2010-10-07 NOTE — Assessment & Plan Note (Signed)
I rechecked his blood pressure was about 140/80. His resting heart rate is too fast. I'll increase his metoprolol or Lopressor to 75 g twice a day.

## 2010-10-08 ENCOUNTER — Telehealth: Payer: Self-pay | Admitting: *Deleted

## 2010-10-08 NOTE — Telephone Encounter (Signed)
Pt walked in today after being seen in the office yesterday.  He was given orders yesterday to increase his metoprolol from 50 mg one tablet bid to 50 mg one and 1/2 talbets bid d/t tachycardia (HR 103) and hypertension (153/98 and 140/80)however when pt returned home he discovered he has been taking 100 mg bid for appro. 2 weeks.  Pt would like to know how to proceed.  Will follow up with Dr Daleen Squibb for further orders.  Pt is agreeable and will except a call back with new orders.

## 2010-10-14 ENCOUNTER — Other Ambulatory Visit: Payer: Self-pay | Admitting: Gastroenterology

## 2010-10-14 NOTE — Telephone Encounter (Signed)
Patient needs OV.

## 2010-10-19 NOTE — Telephone Encounter (Signed)
Gigi Gin will tell

## 2010-10-19 NOTE — Telephone Encounter (Signed)
Lance [name] will make pt aware to continue with current lopressor 100mg  bid. Mylo Red RN

## 2010-10-22 ENCOUNTER — Encounter: Payer: Self-pay | Admitting: Gastroenterology

## 2010-10-22 NOTE — Telephone Encounter (Signed)
Pt is aware of OV for 7/16 @ 0900 with LSL

## 2010-11-03 ENCOUNTER — Encounter: Payer: Self-pay | Admitting: Gastroenterology

## 2010-11-03 ENCOUNTER — Ambulatory Visit (INDEPENDENT_AMBULATORY_CARE_PROVIDER_SITE_OTHER): Payer: Medicare Other | Admitting: Gastroenterology

## 2010-11-03 VITALS — BP 136/83 | HR 76 | Temp 97.5°F | Ht 69.0 in | Wt 217.0 lb

## 2010-11-03 DIAGNOSIS — K219 Gastro-esophageal reflux disease without esophagitis: Secondary | ICD-10-CM

## 2010-11-03 MED ORDER — DEXLANSOPRAZOLE 60 MG PO CPDR: 60.0000 mg | DELAYED_RELEASE_CAPSULE | Freq: Every day | ORAL | Status: DC

## 2010-11-03 NOTE — Patient Instructions (Signed)
Stop Nexium for now. We are going to see how you do with Dexilant. We have given you #20 samples. Please call us in about 10 days to let us know how you are doing.   Make sure you go ahead and activate the savings card for Dexilant. DON'T fill it yet until we see how you do.  If needed, we may need to return to Nexium twice a day. We will work with the insurance company to attempt to get this covered.  We will see you back in 6 months.  Keep up the good work with the weight loss!!! Continue to avoid fatty foods.

## 2010-11-03 NOTE — Assessment & Plan Note (Signed)
65 year old Caucasian male with chronic GERD, controlled best with Nexium BID. Has undergone EGD in Dec 2011 without concerning findings. He underwent a thorough work-up inpatient as well to assess for gastroparesis and/or biliary component. Insurance company is making it difficult to obtain Nexium. He denies any abdominal pain, N/V, or dysphagia.   At this time, we will give samples of Dexilant for a total of 10 days. If he has improvement with this, we will continue Dexilant. If not, we will provide samples of Nexium until a PA can be obtained. I don't feel this should be an issue, as he has tried Prilosec, Protonix, Prevacid.   He should continue wt loss efforts at this point. He has done well so far with lifestyle modification. F/U in 6 mos.  PR in 10 days.

## 2010-11-03 NOTE — Progress Notes (Signed)
Cc to PCP an Dr Everardo All
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
## 2010-11-10 NOTE — Progress Notes (Signed)
agree

## 2010-11-12 ENCOUNTER — Telehealth: Payer: Self-pay

## 2010-11-12 MED ORDER — ESOMEPRAZOLE MAGNESIUM 40 MG PO CPDR: 40.0000 mg | DELAYED_RELEASE_CAPSULE | Freq: Every day | ORAL | Status: DC

## 2010-11-12 NOTE — Telephone Encounter (Signed)
Please send rx to pharmacy- pt uses [facility]- so they can give me insurance information. This information is not available in [facility]

## 2010-11-12 NOTE — Telephone Encounter (Signed)
Routed to [place] for PA on Nexium.

## 2010-11-12 NOTE — Telephone Encounter (Signed)
Called pt for PR per Gerrit Halls, NP. He said the Dexilant is helping but not as much as the Nexium, I told him Marya Landry she might do  A PA for the Nexium.

## 2010-11-12 NOTE — Telephone Encounter (Signed)
Yes. Definitely need to do a PA. I thought it was worth a try for Dexilant due to insurance issues. Please start PA for Nexium. He has tried multiple things in the past. I can help out once started. Thanks!

## 2010-11-12 NOTE — Telephone Encounter (Signed)
Noted. Sent rx for Nexium to pharmacy.

## 2010-11-13 ENCOUNTER — Other Ambulatory Visit: Payer: Self-pay | Admitting: Family Medicine

## 2010-11-13 ENCOUNTER — Ambulatory Visit (HOSPITAL_COMMUNITY)
Admission: RE | Admit: 2010-11-13 | Discharge: 2010-11-13 | Disposition: A | Discharge status: Home / Self Care | Payer: Medicare Other | Source: Ambulatory Visit | Attending: Family Medicine | Admitting: Family Medicine

## 2010-11-13 DIAGNOSIS — T1490XA Injury, unspecified, initial encounter: Secondary | ICD-10-CM

## 2010-11-13 DIAGNOSIS — M7989 Other specified soft tissue disorders: Secondary | ICD-10-CM | POA: Insufficient documentation

## 2010-11-13 DIAGNOSIS — M79609 Pain in unspecified limb: Secondary | ICD-10-CM | POA: Insufficient documentation

## 2010-11-16 ENCOUNTER — Ambulatory Visit: Payer: Medicare Other | Admitting: Gastroenterology

## 2010-11-16 ENCOUNTER — Telehealth: Payer: Self-pay | Admitting: Gastroenterology

## 2010-11-17 NOTE — Telephone Encounter (Signed)
Sending to Soledad Gerlach.

## 2010-11-17 NOTE — Telephone Encounter (Signed)
Just seen 11/03/10. ?duplicate appt? No need to reschedule at this point. Tobi Bastos is working on [employer] For new PPI.

## 2010-12-11 ENCOUNTER — Encounter: Payer: Self-pay | Admitting: *Deleted

## 2011-01-06 ENCOUNTER — Encounter (HOSPITAL_COMMUNITY): Payer: Self-pay

## 2011-01-06 ENCOUNTER — Emergency Department (HOSPITAL_COMMUNITY): Payer: Medicare Other

## 2011-01-06 ENCOUNTER — Other Ambulatory Visit: Payer: Self-pay

## 2011-01-06 ENCOUNTER — Emergency Department (HOSPITAL_COMMUNITY)
Admission: EM | Admit: 2011-01-06 | Discharge: 2011-01-06 | Disposition: A | Discharge status: Home / Self Care | Payer: Medicare Other | Attending: Emergency Medicine | Admitting: Emergency Medicine

## 2011-01-06 DIAGNOSIS — F17200 Nicotine dependence, unspecified, uncomplicated: Secondary | ICD-10-CM | POA: Insufficient documentation

## 2011-01-06 DIAGNOSIS — Z794 Long term (current) use of insulin: Secondary | ICD-10-CM | POA: Insufficient documentation

## 2011-01-06 DIAGNOSIS — K219 Gastro-esophageal reflux disease without esophagitis: Secondary | ICD-10-CM

## 2011-01-06 DIAGNOSIS — F341 Dysthymic disorder: Secondary | ICD-10-CM | POA: Insufficient documentation

## 2011-01-06 DIAGNOSIS — I10 Essential (primary) hypertension: Secondary | ICD-10-CM | POA: Insufficient documentation

## 2011-01-06 DIAGNOSIS — E119 Type 2 diabetes mellitus without complications: Secondary | ICD-10-CM | POA: Insufficient documentation

## 2011-01-06 DIAGNOSIS — I2510 Atherosclerotic heart disease of native coronary artery without angina pectoris: Secondary | ICD-10-CM | POA: Insufficient documentation

## 2011-01-06 DIAGNOSIS — Z7982 Long term (current) use of aspirin: Secondary | ICD-10-CM | POA: Insufficient documentation

## 2011-01-06 DIAGNOSIS — R079 Chest pain, unspecified: Secondary | ICD-10-CM | POA: Insufficient documentation

## 2011-01-06 DIAGNOSIS — Z8673 Personal history of transient ischemic attack (TIA), and cerebral infarction without residual deficits: Secondary | ICD-10-CM | POA: Insufficient documentation

## 2011-01-06 DIAGNOSIS — Z79899 Other long term (current) drug therapy: Secondary | ICD-10-CM | POA: Insufficient documentation

## 2011-01-06 LAB — PROTIME-INR
INR: 1.01 (ref 0.00–1.49)
Prothrombin Time: 13.5 seconds (ref 11.6–15.2)

## 2011-01-06 LAB — BASIC METABOLIC PANEL
BUN: 11 mg/dL (ref 6–23)
CO2: 25 mEq/L (ref 19–32)
Calcium: 9.4 mg/dL (ref 8.4–10.5)
Chloride: 99 mEq/L (ref 96–112)
Creatinine, Ser: 0.78 mg/dL (ref 0.50–1.35)
GFR calc Af Amer: >60 mL/min (ref >60)
GFR calc non Af Amer: >60 mL/min (ref >60)
Glucose, Bld: 186 mg/dL — ABNORMAL HIGH (ref 70–99)
Potassium: 4.1 mEq/L (ref 3.5–5.1)
Sodium: 133 mEq/L — ABNORMAL LOW (ref 135–145)

## 2011-01-06 LAB — APTT: aPTT: 29 seconds (ref 24–37)

## 2011-01-06 LAB — CARDIAC PANEL(CRET KIN+CKTOT+MB+TROPI)
CK, MB: 2.4 ng/mL (ref 0.3–4.0)
Relative Index: INVALID (ref 0.0–2.5)
Total CK: 63 U/L (ref 7–232)
Troponin I: <0.3 ng/mL (ref <0.30)

## 2011-01-06 MED ORDER — ASPIRIN 81 MG PO CHEW
324.0000 mg | CHEWABLE_TABLET | Freq: Once | ORAL | Status: AC
  Administered 2011-01-06: 324 mg via ORAL
  Filled 2011-01-06: qty 4

## 2011-01-06 MED ORDER — SODIUM CHLORIDE 0.9 % IV SOLN
Freq: Once | INTRAVENOUS | Status: AC
  Administered 2011-01-06: 05:00:00 via INTRAVENOUS

## 2011-01-06 MED ORDER — HYDROMORPHONE HCL 1 MG/ML IJ SOLN: 1.0000 mg | Freq: Once | INTRAMUSCULAR | Status: DC

## 2011-01-06 MED ORDER — SODIUM CHLORIDE 0.9 % IV SOLN: Freq: Once | INTRAVENOUS | Status: DC

## 2011-01-06 MED ORDER — HYDROMORPHONE HCL 1 MG/ML IJ SOLN
INTRAMUSCULAR | Status: AC
  Filled 2011-01-06: qty 1

## 2011-01-06 MED ORDER — GI COCKTAIL ~~LOC~~
30.0000 mL | Freq: Once | ORAL | Status: AC
  Administered 2011-01-06: 30 mL via ORAL
  Filled 2011-01-06: qty 30

## 2011-01-06 NOTE — ED Notes (Signed)
Cp that woke him from sleep, denies any other complaints
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
## 2011-01-07 ENCOUNTER — Encounter: Payer: Self-pay | Admitting: Cardiology

## 2011-01-07 ENCOUNTER — Ambulatory Visit (INDEPENDENT_AMBULATORY_CARE_PROVIDER_SITE_OTHER): Payer: Medicare Other | Admitting: Cardiology

## 2011-01-07 VITALS — BP 104/68 | HR 80 | Ht 69.0 in | Wt 206.0 lb

## 2011-01-07 DIAGNOSIS — N400 Enlarged prostate without lower urinary tract symptoms: Secondary | ICD-10-CM

## 2011-01-07 DIAGNOSIS — R079 Chest pain, unspecified: Secondary | ICD-10-CM | POA: Insufficient documentation

## 2011-01-07 DIAGNOSIS — I2510 Atherosclerotic heart disease of native coronary artery without angina pectoris: Secondary | ICD-10-CM

## 2011-01-07 NOTE — Patient Instructions (Addendum)
Your physician recommends that you schedule a follow-up appointment in: 6 months with Dr. Wall  
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
## 2011-01-12 ENCOUNTER — Encounter: Payer: Self-pay | Admitting: Gastroenterology

## 2011-01-12 ENCOUNTER — Ambulatory Visit (INDEPENDENT_AMBULATORY_CARE_PROVIDER_SITE_OTHER): Payer: Medicare Other | Admitting: Gastroenterology

## 2011-01-12 VITALS — BP 129/75 | HR 80 | Temp 97.2°F | Ht 69.0 in | Wt 209.0 lb

## 2011-01-12 DIAGNOSIS — K219 Gastro-esophageal reflux disease without esophagitis: Secondary | ICD-10-CM

## 2011-01-12 MED ORDER — ESOMEPRAZOLE MAGNESIUM 40 MG PO CPDR: 40.0000 mg | DELAYED_RELEASE_CAPSULE | Freq: Two times a day (BID) | ORAL | Status: DC

## 2011-01-12 NOTE — Progress Notes (Signed)
Primary Care Physician: Lilyan Punt, MD, MD  Primary Gastroenterologist:  Roetta Sessions, MD   Chief Complaint  Patient presents with  . Gastrophageal Reflux    meds not working    HPI: Lance [name] is a 65 y.o. male here for further evaluation of refractory GERD. Last seen in 7/12 here. Started on Dexilant for refractory heartburn. Patient reports no improvement. 7pm last meal. Lay down around 10pm. Nexium am, lansoprazole rx at night, lansoprazole otc prn too. [place] prn. For past two weeks refractory heartburn really bad. Seen in ED 01/06/11. F/U with Dr. Valera Castle on 01/07/11. Woke up a 4am with swelling sensation in chest. CXR ok. Cardiac markers neg X 1. EKG ok. Patient refused hospitalization. Per patient, Dr. Daleen Squibb did not think symptoms were cardiac.   No abd pain. BM regular. Last TCS 2010, [facility]. No polyps. No FH CRC. Notes the heartburn is worse at night, wakes up from sleep. Does have daytime symptoms. Worse with chicken wings, sausage biscuit, hotdog although patient denies eating these items regularly. Weight down 50 lbs since 03/2011, intentional.      Current Outpatient Prescriptions  Medication Sig Dispense Refill  . ALPRAZolam (XANAX) 1 MG tablet Take 1 mg by mouth at bedtime as needed.        Marland Kitchen aspirin (ASPIR-LOW) 81 MG EC tablet Take 81 mg by mouth daily.        Marland Kitchen atorvastatin (LIPITOR) 80 MG tablet Take 80 mg by mouth daily.        . B-D ULTRAFINE III SHORT PEN 31G X 8 MM MISC As directed      . citalopram (CELEXA) 20 MG tablet Take 20 mg by mouth daily.        . diazepam (VALIUM) 5 MG tablet Take 5 mg by mouth daily.        Marland Kitchen dipyridamole-aspirin (AGGRENOX) 25-200 MG per 12 hr capsule Take 1 capsule by mouth daily.        Marland Kitchen esomeprazole (NEXIUM) 40 MG capsule Take 1 capsule (40 mg total) by mouth 2 (two) times daily before a meal.  30 capsule  0  . Exenatide (BYETTA 5 MCG PEN [place]) Inject 5 Units into the skin 2 (two) times daily.       . fexofenadine (ALLEGRA)  180 MG tablet Take 180 mg by mouth as needed.        . fish oil-omega-3 fatty acids 1000 MG capsule Take 1 g by mouth daily.        Marland Kitchen gabapentin (NEURONTIN) 300 MG capsule Take 300 mg by mouth 3 (three) times daily.        Marland Kitchen HYDROcodone-acetaminophen (NORCO) 7.5-325 MG per tablet Take 1 tablet by mouth as needed.        . hydrOXYzine (ATARAX) 25 MG tablet Take 25 mg by mouth 3 (three) times daily as needed.        . insulin glargine (LANTUS SOLOSTAR) 100 UNIT/ML injection Inject 40 Units into the skin. 60 units with a sliding scale      . insulin lispro (HUMALOG) 100 UNIT/ML injection Inject 12 Units into the skin 3 (three) times daily before meals. Sliding scale       . lisinopril (PRINIVIL,ZESTRIL) 10 MG tablet Take 20 mg by mouth daily.       . metFORMIN (GLUMETZA) 1000 MG (MOD) 24 hr tablet Take 1,000 mg by mouth 2 (two) times daily.        . metoprolol (LOPRESSOR) 50 MG tablet  100 mg. Take 1 and 1/2 tablets twice a day       . nitroGLYCERIN (NITROSTAT) 0.4 MG SL tablet Place 0.4 mg under the tongue as directed.        . tadalafil (CIALIS) 10 MG tablet Take 10 mg by mouth as directed.        . traZODone (DESYREL) 50 MG tablet Take 50 mg by mouth at bedtime. Unsure of dosage  Has not taken today       . vitamin B-12 (CYANOCOBALAMIN) 1000 MCG tablet Take 1,000 mcg by mouth daily.          Allergies as of 01/12/2011 - Review Complete 01/12/2011  Allergen Reaction Noted  . Penicillins Rash     ROS:  General: Negative for anorexia, weight loss, fever, chills, fatigue, weakness. ENT: Negative for hoarseness, difficulty swallowing , nasal congestion. CV: Negative for chest pain, angina, palpitations, dyspnea on exertion, peripheral edema.  Respiratory: Negative for dyspnea at rest, dyspnea on exertion, cough, sputum, wheezing.  GI: See history of present illness. GU:  Negative for dysuria, hematuria, urinary incontinence, urinary frequency, nocturnal urination.  Endo: Negative for unusual  weight change.    Physical Examination:   BP 129/75  Pulse 80  Temp(Src) 97.2 F (36.2 C) (Temporal)  Ht 5\' 9"  (1.753 m)  Wt 209 lb (94.802 kg)  BMI 30.86 kg/m2  General: Well-nourished, well-developed in no acute distress.  Eyes: No icterus. Mouth: Oropharyngeal mucosa moist and pink , no lesions erythema or exudate. Lungs: Clear to auscultation bilaterally.  Heart: Regular rate and rhythm, no murmurs rubs or gallops.  Abdomen: Bowel sounds are normal, nontender, nondistended, no hepatosplenomegaly or masses, no abdominal bruits or hernia , no rebound or guarding.   Extremities: No lower extremity edema. No clubbing or deformities. Neuro: Alert and oriented x 4   Skin: Warm and dry, no jaundice.   Psych: Alert and cooperative, normal mood and affect.  Labs:   Lab Results  Component Value Date   CREATININE 0.78 01/06/2011   BUN 11 01/06/2011   NA 133* 01/06/2011   K 4.1 01/06/2011   CL 99 01/06/2011   CO2 25 01/06/2011       Imaging Studies: Dg Chest 1 View  01/06/2011  *RADIOLOGY REPORT*  Clinical Data: Chest pain and burning  CHEST - 1 VIEW  Comparison: 04/05/2010  Findings: Two lead cardiac pacemaker without change in position since previous study.  Surgical clips in the base of the neck. Shallow inspiration.  Hypertrophic changes of the right first costochondral junction.  Normal heart size and pulmonary vascularity.  No focal airspace consolidation in the lungs.  No blunting of costophrenic angles.  No pneumothorax.  IMPRESSION: Stable appearance of the chest.  No evidence of active pulmonary disease.  Original Report Authenticated By: Marlon Pel, M.D.    ABD u/s 12/11: hepatic steatosis. gb normal. GES 12/11: borderline normal, 30% retained at 2 hrs.

## 2011-01-12 NOTE — Assessment & Plan Note (Signed)
Refractory GERD on daily nexium am, lansoprazole pm. Patient previously did well on nexium bid. Resume. #30 samples provided. Check on prior authorization. PR in one week.   Anti-reflux measures. Discussed dietary changes with patient.   If still symptomatic, he may require addition of promotility agent or pH/impedence study vs HIDA.

## 2011-01-12 NOTE — Patient Instructions (Signed)
For heartburn, take Nexium 40mg  one 30 minutes before breakfast and one 30 minutes before evening meal.  Call next week and let us know how you are doing. If symptoms still persist and not improved, we may consider adding promotility agent to help control your reflux. Please stop Prevacid 24hr or lansoprazole. Take only Nexium twice daily. You may use Zantac, TUMS, Rolaids, Mylanta etc. Please follow anti-reflux measures as shown below.  Diet for GERD or PUD Nutrition therapy can help ease the discomfort of gastroesophageal reflux disease (GERD) and peptic ulcer disease (PUD).  HOME CARE INSTRUCTIONS  Eat your meals slowly, in a relaxed setting.   Eat 5 to 6 small meals per day.   If a food causes distress, stop eating it for a period of time.  FOODS TO AVOID:  Coffee, regular or decaffeinated.  Cola beverages, regular or low calorie.   Tea, regular or decaffeinated.   Pepper.   Cocoa.   High fat foods including meats.   Butter, margarine, hydrogenated oil (trans fats).  Peppermint or spearmint (if you have GERD).   Fruits and vegetables as tolerated.   Alcoholic beverages.   Nicotine (smoking or chewing). This is one of the most potent stimulants to acid production in the gastrointestinal tract.   Any food that seems to aggravate your condition.   If you have questions regarding your diet, call your caregiver's office or a registered dietitian. OTHER TIPS IF YOU HAVE GERD:  Lying flat may make symptoms worse. Keep the head of your bed raised 6 to 9 inches by using a foam wedge or blocks under the legs of the bed.   Do not lay down until 3 hours after eating a meal.   Daily physical activity may help reduce symptoms.  MAKE SURE YOU:   Understand these instructions.   Will watch your condition.   Will get help right away if you are not doing well or get worse.  Document Released: 04/19/2005 Document Re-Released: 09/05/2008 [facility] Patient Information 2011  [place], [place].  Gastroesophageal Reflux Disease (GERD) Your caregiver has diagnosed your chest discomfort as caused by gastroesophageal reflux disease (GERD). GERD is caused by a reflux of acid from your stomach into the digestive tube between your mouth and stomach (esophagus). Acid in contact with the esophagus causes soreness (inflammation) resulting in heartburn or chest pain. It may cause small holes in the lining of the esophagus (ulcers). CAUSES  Increased body weight puts pressure on the stomach, making acid rise.   Smoking increases acid production.   Alcohol decreases pressure on the valve between the stomach and esophagus (lower esophageal sphincter), allowing acid from the stomach into the esophagus.   Late evening meals and a full stomach increase pressure and acid production.   Lower esophageal sphincter is malformed.   Sometimes, no reason is found.  HOME CARE INSTRUCTIONS  Change the factors that you can control. Weight, smoking, or alcohol changes may be difficult to change on your own. Your caregiver can provide guidance and medical therapy.   Raising the head of your bed may help you to sleep.   Over-the-counter medicines will decrease acid production. Your caregiver can also prescribe medicines for this. Only take over-the-counter or prescription medicines for pain, discomfort, or fever as directed by your caregiver.   1/2 to 1 teaspoon of an antacid taken every hour while awake, with meals, and at bedtime, can neutralize acid.   DO NOT take aspirin, ibuprofen, or other nonsteroidal anti-inflammatory drugs (NSAIDs).  SEEK IMMEDIATE MEDICAL CARE IF:  The pain changes in location (radiates into arms, neck, jaw, teeth, or back), intensity, or duration.   You start feeling sick to your stomach (nauseous), start throwing up (vomiting), or sweating (diaphoresis).   You develop left arm or jaw pain.   You develop pain going into your back, shortness of breath, or you  pass out.   There is vomiting of fluid that is green, yellow, or looks like coffee grounds or blood.  These symptoms could signal other problems, such as heart disease. MAKE SURE YOU:  Understand these instructions.   Will watch your condition.   Will get help right away if you are not doing well or get worse.  Document Released: 01/27/2005 Document Re-Released: 07/14/2009 [facility] Patient Information 2011 [place], [place].

## 2011-01-13 NOTE — Progress Notes (Signed)
Cc to PCP 

## 2011-01-15 ENCOUNTER — Telehealth: Payer: Self-pay | Admitting: *Deleted

## 2011-01-15 DIAGNOSIS — T82198A Other mechanical complication of other cardiac electronic device, initial encounter: Secondary | ICD-10-CM

## 2011-01-15 NOTE — Telephone Encounter (Signed)
Checking lead 

## 2011-01-20 ENCOUNTER — Ambulatory Visit (INDEPENDENT_AMBULATORY_CARE_PROVIDER_SITE_OTHER)
Admission: RE | Admit: 2011-01-20 | Discharge: 2011-01-20 | Disposition: A | Discharge status: Home / Self Care | Payer: Medicare Other | Source: Ambulatory Visit | Attending: Internal Medicine | Admitting: Internal Medicine

## 2011-01-20 DIAGNOSIS — T82198A Other mechanical complication of other cardiac electronic device, initial encounter: Secondary | ICD-10-CM

## 2011-01-25 ENCOUNTER — Telehealth: Payer: Self-pay | Admitting: Gastroenterology

## 2011-01-25 NOTE — Telephone Encounter (Signed)
Please call patient and find out how he is doing on Nexium 40mg  bid.

## 2011-01-27 LAB — CBC
HCT: 33 — ABNORMAL LOW
HCT: 38.1 — ABNORMAL LOW
HCT: 40.3
HCT: 27 — ABNORMAL LOW
HCT: 28.6 — ABNORMAL LOW
HCT: 29 — ABNORMAL LOW
HCT: 30.5 — ABNORMAL LOW
Hemoglobin: 11.2 — ABNORMAL LOW
Hemoglobin: 13
Hemoglobin: 13.5
Hemoglobin: 10.4 — ABNORMAL LOW
Hemoglobin: 9.4 — ABNORMAL LOW
Hemoglobin: 9.8 — ABNORMAL LOW
Hemoglobin: 9.8 — ABNORMAL LOW
MCHC: 33.6
MCHC: 34
MCHC: 34.1
MCHC: 33.9
MCHC: 34.1
MCHC: 34.2
MCHC: 35
MCV: 87
MCV: 87.5
MCV: 88.4
MCV: 86.7
MCV: 87.3
MCV: 87.6
MCV: 88.8
Platelets: 115 — ABNORMAL LOW
Platelets: 124 — ABNORMAL LOW
Platelets: 124 — ABNORMAL LOW
Platelets: 100 — ABNORMAL LOW
Platelets: 113 — ABNORMAL LOW
Platelets: 83 — ABNORMAL LOW
Platelets: 85 — ABNORMAL LOW
RBC: 3.77 — ABNORMAL LOW
RBC: 4.37
RBC: 4.56
RBC: 3.09 — ABNORMAL LOW
RBC: 3.27 — ABNORMAL LOW
RBC: 3.3 — ABNORMAL LOW
RBC: 3.48 — ABNORMAL LOW
RDW: 14.9
RDW: 15
RDW: 15.1
RDW: 14.1
RDW: 14.7
RDW: 14.8
RDW: 15
WBC: 4.6
WBC: 5.1
WBC: 8.7
WBC: 5.2
WBC: 6.9
WBC: 7
WBC: 7.4

## 2011-01-27 LAB — BASIC METABOLIC PANEL
BUN: 20
BUN: 25 — ABNORMAL HIGH
BUN: 10
BUN: 17
BUN: 7
CO2: 23
CO2: 24
CO2: 23
CO2: 27
CO2: 27
Calcium: 7.8 — ABNORMAL LOW
Calcium: 7.9 — ABNORMAL LOW
Calcium: 7.9 — ABNORMAL LOW
Calcium: 8.6
Calcium: 8.8
Chloride: 103
Chloride: 105
Chloride: 103
Chloride: 104
Chloride: 107
Creatinine, Ser: 2.4 — ABNORMAL HIGH
Creatinine, Ser: 2.88 — ABNORMAL HIGH
Creatinine, Ser: 1.02
Creatinine, Ser: 1.05
Creatinine, Ser: 1.41
GFR calc Af Amer: 27 — ABNORMAL LOW
GFR calc Af Amer: 34 — ABNORMAL LOW
GFR calc Af Amer: >60
GFR calc Af Amer: >60
GFR calc Af Amer: >60
GFR calc non Af Amer: 23 — ABNORMAL LOW
GFR calc non Af Amer: 28 — ABNORMAL LOW
GFR calc non Af Amer: 51 — ABNORMAL LOW
GFR calc non Af Amer: >60
GFR calc non Af Amer: >60
Glucose, Bld: 182 — ABNORMAL HIGH
Glucose, Bld: 303 — ABNORMAL HIGH
Glucose, Bld: 108 — ABNORMAL HIGH
Glucose, Bld: 156 — ABNORMAL HIGH
Glucose, Bld: 176 — ABNORMAL HIGH
Potassium: 4.5
Potassium: 5.1
Potassium: 3.7
Potassium: 3.8
Potassium: 4.4
Sodium: 134 — ABNORMAL LOW
Sodium: 136
Sodium: 136
Sodium: 136
Sodium: 138

## 2011-01-27 LAB — DIFFERENTIAL
Basophils Absolute: 0
Basophils Relative: 0
Eosinophils Absolute: 0.1
Eosinophils Relative: 2
Lymphocytes Relative: 35
Lymphs Abs: 1.6
Monocytes Absolute: 0.4
Monocytes Relative: 9
Neutro Abs: 2.5
Neutrophils Relative %: 54

## 2011-01-27 LAB — URINE CULTURE
Colony Count: NO GROWTH
Culture: NO GROWTH
Special Requests: NEGATIVE

## 2011-01-27 LAB — COMPREHENSIVE METABOLIC PANEL
ALT: 49
AST: 42 — ABNORMAL HIGH
Albumin: 3.3 — ABNORMAL LOW
Alkaline Phosphatase: 54
BUN: 9
CO2: 25
Calcium: 9.1
Chloride: 103
Creatinine, Ser: 0.88
GFR calc Af Amer: >60
GFR calc non Af Amer: >60
Glucose, Bld: 191 — ABNORMAL HIGH
Potassium: 3.8
Sodium: 137
Total Bilirubin: 0.7
Total Protein: 6

## 2011-01-27 LAB — PROTIME-INR
INR: 1
INR: 1.1
INR: 1.4
INR: 1.7 — ABNORMAL HIGH
INR: 1.7 — ABNORMAL HIGH
Prothrombin Time: 13
Prothrombin Time: 14.1
Prothrombin Time: 17.5 — ABNORMAL HIGH
Prothrombin Time: 20 — ABNORMAL HIGH
Prothrombin Time: 20 — ABNORMAL HIGH

## 2011-01-27 LAB — URINALYSIS, ROUTINE W REFLEX MICROSCOPIC
Bilirubin Urine: NEGATIVE
Bilirubin Urine: NEGATIVE
Glucose, UA: >1000 — AB
Glucose, UA: 100 — AB
Hgb urine dipstick: NEGATIVE
Ketones, ur: NEGATIVE
Ketones, ur: NEGATIVE
Leukocytes, UA: NEGATIVE
Nitrite: NEGATIVE
Nitrite: NEGATIVE
Protein, ur: NEGATIVE
Protein, ur: NEGATIVE
Specific Gravity, Urine: 1.015
Specific Gravity, Urine: 1.006
Urobilinogen, UA: 0.2
Urobilinogen, UA: 0.2
pH: 7
pH: 6

## 2011-01-27 LAB — CREATININE, URINE, RANDOM: Creatinine, Urine: 29

## 2011-01-27 LAB — URINE MICROSCOPIC-ADD ON

## 2011-01-27 LAB — APTT: aPTT: 26

## 2011-01-27 LAB — PROTEIN, URINE, RANDOM: Total Protein, Urine: 8

## 2011-01-27 LAB — NA AND K (SODIUM & POTASSIUM), RAND UR
Potassium Urine: 10
Sodium, Ur: 42

## 2011-01-27 LAB — TYPE AND SCREEN
ABO/RH(D): O POS
Antibody Screen: NEGATIVE

## 2011-01-27 LAB — ABO/RH: ABO/RH(D): O POS

## 2011-01-28 NOTE — Telephone Encounter (Signed)
Tried to call pt- LMOM 

## 2011-01-29 ENCOUNTER — Telehealth: Payer: Self-pay | Admitting: Gastroenterology

## 2011-01-29 LAB — POCT CARDIAC MARKERS
CKMB, poc: 1.3
CKMB, poc: <1 — ABNORMAL LOW
Myoglobin, poc: 52.9
Myoglobin, poc: 66
Operator id: 234501
Operator id: 294501
Troponin i, poc: <0.05
Troponin i, poc: <0.05

## 2011-01-29 LAB — POCT I-STAT, CHEM 8
BUN: 10
Calcium, Ion: 1.08 — ABNORMAL LOW
Chloride: 106
Creatinine, Ser: 1
Glucose, Bld: 85
HCT: 31 — ABNORMAL LOW
Hemoglobin: 10.5 — ABNORMAL LOW
Potassium: 4
Sodium: 140
TCO2: 24

## 2011-01-29 LAB — CARDIAC PANEL(CRET KIN+CKTOT+MB+TROPI)
CK, MB: 2.2
CK, MB: 2.6
Relative Index: INVALID
Relative Index: INVALID
Total CK: 57
Total CK: 58
Troponin I: <0.01
Troponin I: <0.01

## 2011-01-29 LAB — CK TOTAL AND CKMB (NOT AT ARMC)
CK, MB: 2.6
Relative Index: INVALID
Total CK: 94

## 2011-01-29 LAB — BASIC METABOLIC PANEL
BUN: 8
CO2: 28
Calcium: 8.7
Chloride: 102
Creatinine, Ser: 0.77
GFR calc Af Amer: >60
GFR calc non Af Amer: >60
Glucose, Bld: 180 — ABNORMAL HIGH
Potassium: 4.1
Sodium: 137

## 2011-01-29 LAB — CBC
HCT: 32 — ABNORMAL LOW
HCT: 33.5 — ABNORMAL LOW
HCT: 33.5 — ABNORMAL LOW
Hemoglobin: 10.9 — ABNORMAL LOW
Hemoglobin: 11.3 — ABNORMAL LOW
Hemoglobin: 11.4 — ABNORMAL LOW
MCHC: 33.7
MCHC: 34.1
MCHC: 34.2
MCV: 84.8
MCV: 85.1
MCV: 85.6
Platelets: 120 — ABNORMAL LOW
Platelets: 123 — ABNORMAL LOW
Platelets: 142 — ABNORMAL LOW
RBC: 3.77 — ABNORMAL LOW
RBC: 3.91 — ABNORMAL LOW
RBC: 3.93 — ABNORMAL LOW
RDW: 14.8
RDW: 15.1
RDW: 15.1
WBC: 4.7
WBC: 5.7
WBC: 5.9

## 2011-01-29 LAB — HEMOGLOBIN A1C
Hgb A1c MFr Bld: 8.1 — ABNORMAL HIGH
Hgb A1c MFr Bld: 8.3 — ABNORMAL HIGH
Mean Plasma Glucose: 211
Mean Plasma Glucose: 218

## 2011-01-29 LAB — DIFFERENTIAL
Basophils Absolute: 0
Basophils Relative: 1
Eosinophils Absolute: 0.1
Eosinophils Relative: 1
Lymphocytes Relative: 31
Lymphs Abs: 1.8
Monocytes Absolute: 0.6
Monocytes Relative: 10
Neutro Abs: 3.4
Neutrophils Relative %: 58

## 2011-01-29 LAB — LIPID PANEL
Cholesterol: 117
HDL: 29 — ABNORMAL LOW
LDL Cholesterol: 75
Total CHOL/HDL Ratio: 4
Triglycerides: 63
VLDL: 13

## 2011-01-29 LAB — PROTIME-INR
INR: 1.1
Prothrombin Time: 14.2

## 2011-01-29 LAB — APTT: aPTT: 30

## 2011-01-29 LAB — TROPONIN I: Troponin I: <0.01

## 2011-01-29 MED ORDER — ESOMEPRAZOLE MAGNESIUM 40 MG PO CPDR: 40.0000 mg | DELAYED_RELEASE_CAPSULE | Freq: Two times a day (BID) | ORAL | Status: DC

## 2011-01-29 NOTE — Telephone Encounter (Signed)
PT CALLED TO LET us KNOW THE " PURPLE PILL IS WORKING GREAT" AND HE WOULD LIKE [place] TO CALL IN A Rx for it to [facility]- If there are any questions he can be reached at [phone].

## 2011-01-29 NOTE — Telephone Encounter (Signed)
Routed to [facility] 

## 2011-01-29 NOTE — Telephone Encounter (Signed)
Pt called back. See separate phone note.

## 2011-02-02 ENCOUNTER — Other Ambulatory Visit: Payer: Self-pay | Admitting: Cardiology

## 2011-02-03 LAB — CBC
HCT: 33.7 — ABNORMAL LOW
HCT: 36.8 — ABNORMAL LOW
Hemoglobin: 11.3 — ABNORMAL LOW
Hemoglobin: 12.2 — ABNORMAL LOW
MCHC: 33.1
MCHC: 33.6
MCV: 86
MCV: 86.2
Platelets: 117 — ABNORMAL LOW
Platelets: 154
RBC: 3.92 — ABNORMAL LOW
RBC: 4.27
RDW: 16.2 — ABNORMAL HIGH
RDW: 16.4 — ABNORMAL HIGH
WBC: 4.7
WBC: 6.5

## 2011-02-03 LAB — BASIC METABOLIC PANEL
BUN: 10
BUN: 8
CO2: 31
CO2: 31
Calcium: 8.4
Calcium: 9.2
Chloride: 103
Chloride: 104
Creatinine, Ser: 0.83
Creatinine, Ser: 1.03
GFR calc Af Amer: >60
GFR calc Af Amer: >60
GFR calc non Af Amer: >60
GFR calc non Af Amer: >60
Glucose, Bld: 219 — ABNORMAL HIGH
Glucose, Bld: 263 — ABNORMAL HIGH
Potassium: 4.2
Potassium: 4.3
Sodium: 138
Sodium: 140

## 2011-02-03 LAB — GLUCOSE, CAPILLARY
Glucose-Capillary: 100 — ABNORMAL HIGH
Glucose-Capillary: 105 — ABNORMAL HIGH
Glucose-Capillary: 112 — ABNORMAL HIGH
Glucose-Capillary: 130 — ABNORMAL HIGH
Glucose-Capillary: 151 — ABNORMAL HIGH
Glucose-Capillary: 225 — ABNORMAL HIGH

## 2011-02-08 ENCOUNTER — Telehealth: Payer: Self-pay

## 2011-02-08 LAB — COMPREHENSIVE METABOLIC PANEL
ALT: 33
AST: 28
Albumin: 3.3 — ABNORMAL LOW
Alkaline Phosphatase: 61
BUN: 15
CO2: 25
Calcium: 9.5
Chloride: 103
Creatinine, Ser: 0.99
GFR calc Af Amer: >60
GFR calc non Af Amer: >60
Glucose, Bld: 295 — ABNORMAL HIGH
Potassium: 4
Sodium: 137
Total Bilirubin: 0.9
Total Protein: 5.9 — ABNORMAL LOW

## 2011-02-08 LAB — CBC
HCT: 37.8 — ABNORMAL LOW
Hemoglobin: 12.9 — ABNORMAL LOW
MCHC: 34.2
MCV: 86.9
Platelets: 145 — ABNORMAL LOW
RBC: 4.35
RDW: 15
WBC: 4.4

## 2011-02-08 LAB — URINALYSIS, ROUTINE W REFLEX MICROSCOPIC
Bilirubin Urine: NEGATIVE
Glucose, UA: >1000 — AB
Hgb urine dipstick: NEGATIVE
Ketones, ur: NEGATIVE
Leukocytes, UA: NEGATIVE
Nitrite: NEGATIVE
Protein, ur: NEGATIVE
Specific Gravity, Urine: 1.038 — ABNORMAL HIGH
Urobilinogen, UA: 0.2
pH: 5.5

## 2011-02-08 LAB — URINE MICROSCOPIC-ADD ON: Urine-Other: NONE SEEN

## 2011-02-08 NOTE — Telephone Encounter (Signed)
Pt came by office. [facility] did not have his rx for nexium. [facility] approved it on 01/29/11. Called [facility]- they only have rx for nexium 40mg  qd. They will change it to bid.

## 2011-02-10 ENCOUNTER — Telehealth: Payer: Self-pay

## 2011-02-10 NOTE — Telephone Encounter (Signed)
Noted  

## 2011-02-10 NOTE — Telephone Encounter (Signed)
Pt came by and requested samples of Nexium while PA is in process. #15 guven.

## 2011-02-14 ENCOUNTER — Other Ambulatory Visit: Payer: Self-pay | Admitting: Cardiology

## 2011-02-18 ENCOUNTER — Other Ambulatory Visit: Payer: Self-pay | Admitting: *Deleted

## 2011-02-18 MED ORDER — METOPROLOL TARTRATE 50 MG PO TABS: 75.0000 mg | ORAL_TABLET | Freq: Two times a day (BID) | ORAL | Status: DC

## 2011-02-23 ENCOUNTER — Ambulatory Visit (INDEPENDENT_AMBULATORY_CARE_PROVIDER_SITE_OTHER): Payer: Medicare Other | Admitting: Gastroenterology

## 2011-02-23 ENCOUNTER — Encounter: Payer: Self-pay | Admitting: Gastroenterology

## 2011-02-23 VITALS — BP 118/75 | HR 81 | Temp 97.2°F | Ht 69.0 in | Wt 210.4 lb

## 2011-02-23 DIAGNOSIS — K219 Gastro-esophageal reflux disease without esophagitis: Secondary | ICD-10-CM

## 2011-02-23 NOTE — Patient Instructions (Addendum)
I will write a letter to your insurance company today appealing their refusal to cover for Nexium twice daily. I reviewed your prior x-rays, you had a gastric emptying study done in 2011, therefore we will not repeat it at this time. Your stomach emptied just a little bit slower than expected, borderline normal. We have provided you with samples of Nexium today. Continue to take one pill twice a day 30 minutes before breakfast and 30 minutes before evening meal. Please continue to follow her anti-reflex measures as noted below.   Diet for GERD or PUD Nutrition therapy can help ease the discomfort of gastroesophageal reflux disease (GERD) and peptic ulcer disease (PUD).  HOME CARE INSTRUCTIONS   Eat your meals slowly, in a relaxed setting.   Eat 5 to 6 small meals per day.   If a food causes distress, stop eating it for a period of time.  FOODS TO AVOID  Coffee, regular or decaffeinated.   Cola beverages, regular or low calorie.   Tea, regular or decaffeinated.   Pepper.   Cocoa.   High fat foods, including meats.   Butter, margarine, hydrogenated oil (trans fats).   Peppermint or spearmint (if you have GERD).   Fruits and vegetables if not tolerated.   Alcohol.   Nicotine (smoking or chewing). This is one of the most potent stimulants to acid production in the gastrointestinal tract.   Any food that seems to aggravate your condition.  If you have questions regarding your diet, ask your caregiver or a registered dietitian. TIPS  Lying flat may make symptoms worse. Keep the head of your bed raised 6 to 9 inches (15 to 23 cm) by using a foam wedge or blocks under the legs of the bed.   Do not lay down until 3 hours after eating a meal.   Daily physical activity may help reduce symptoms.  MAKE SURE YOU:   Understand these instructions.   Will watch your condition.   Will get help right away if you are not doing well or get worse.  Document Released: 04/19/2005  Document Revised: 12/30/2010 Document Reviewed: 09/02/2008 [facility] Patient Information 2012 [place], [place].

## 2011-02-23 NOTE — Progress Notes (Signed)
Primary Care Physician: Lilyan Punt, MD, MD  Primary Gastroenterologist:  Roetta Sessions, MD   Chief Complaint  Patient presents with  . Heartburn    HPI: Lance [name] is a 65 y.o. male here for refractory heartburn. He was seen back in September 2012 for the same. This was after presenting to the emergency department with atypical chest pain felt to be due to GERD. When I last saw him I put him back on Nexium 40 mg twice a day. He had complete resolution of his reflux symptoms. Atypical chest pain resolved. No more typical heartburn. We have been trying to get the medication covered by [facility] without success. Multiple prior authorizations have been done. At this point we are the stage of sending a letter of appeal. He previously failed Dexilant 60 mg daily, lansoprazole 30 mg daily, Nexium 40 mg daily, Prevacid 30 mg daily, Gaviscon when necessary. He has taken Prilosec OTC twice a day. He tells me since he was last here and ran out of Nexium samples, he was taking Prevacid 24-hour 2 in the morning and 2 in the evening. This did not even touch his heartburn symptoms.  Last Nexium on Thursday morning. Taking Prevacid OTC two at a time twice a day, still with refractory heartburn. Steady pain in center of chest happened recently and went to ED (GI cocktail made it go away). One similar episode since. Mostly with "hot" pain in substernal region and into throat. While on Nexium BID, none of the pain. "Breathing fire" at times. Belching and regurgitation. Tried premedicated prior to a meal with Prevacid (four of them) and it didn't help.    Current Outpatient Prescriptions  Medication Sig Dispense Refill  . ALPRAZolam (XANAX) 1 MG tablet Take 1 mg by mouth at bedtime as needed.        Marland Kitchen aspirin (ASPIR-LOW) 81 MG EC tablet Take 81 mg by mouth daily.        Marland Kitchen atorvastatin (LIPITOR) 80 MG tablet Take 80 mg by mouth daily.        . B-D ULTRAFINE III SHORT PEN 31G X 8 MM MISC As directed      .  citalopram (CELEXA) 20 MG tablet Take 20 mg by mouth daily.        . diazepam (VALIUM) 5 MG tablet Take 5 mg by mouth daily.        Marland Kitchen dipyridamole-aspirin (AGGRENOX) 25-200 MG per 12 hr capsule Take 1 capsule by mouth daily.        . Exenatide (BYETTA 5 MCG PEN [place]) Inject 5 Units into the skin 2 (two) times daily.       . fexofenadine (ALLEGRA) 180 MG tablet Take 180 mg by mouth as needed.        . fish oil-omega-3 fatty acids 1000 MG capsule Take 1 g by mouth daily.        Marland Kitchen gabapentin (NEURONTIN) 300 MG capsule Take 300 mg by mouth 3 (three) times daily.        Marland Kitchen HYDROcodone-acetaminophen (NORCO) 7.5-325 MG per tablet Take 1 tablet by mouth as needed.        . hydrOXYzine (ATARAX) 25 MG tablet Take 25 mg by mouth 3 (three) times daily as needed.        . insulin glargine (LANTUS SOLOSTAR) 100 UNIT/ML injection Inject 40 Units into the skin. 60 units with a sliding scale      . insulin lispro (HUMALOG) 100 UNIT/ML injection Inject 12 Units into the  skin 3 (three) times daily before meals. Sliding scale       . lisinopril (PRINIVIL,ZESTRIL) 10 MG tablet Take 20 mg by mouth daily.       Marland Kitchen lisinopril (PRINIVIL,ZESTRIL) 20 MG tablet TAKE 1 TABLET BY MOUTH EVERY DAY  30 tablet  6  . metFORMIN (GLUMETZA) 1000 MG (MOD) 24 hr tablet Take 1,000 mg by mouth 2 (two) times daily.        . metoprolol (LOPRESSOR) 50 MG tablet Take 1.5 tablets (75 mg total) by mouth 2 (two) times daily. Take 1 and 1/2 tablets twice a day  90 tablet  3  . nitroGLYCERIN (NITROSTAT) 0.4 MG SL tablet Place 0.4 mg under the tongue as directed.        . tadalafil (CIALIS) 10 MG tablet Take 10 mg by mouth as directed.        . traZODone (DESYREL) 50 MG tablet Take 50 mg by mouth at bedtime. Unsure of dosage  Has not taken today       . vitamin B-12 (CYANOCOBALAMIN) 1000 MCG tablet Take 1,000 mcg by mouth daily.        Marland Kitchen esomeprazole (NEXIUM) 40 MG capsule Take 1 capsule (40 mg total) by mouth 2 (two) times daily before a meal.  180  capsule  1    Allergies as of 02/23/2011 - Review Complete 02/23/2011  Allergen Reaction Noted  . Penicillins Rash     ROS:  General: Negative for anorexia, weight loss, fever, chills, fatigue, weakness. ENT: Negative for hoarseness, difficulty swallowing , nasal congestion. CV: Negative for chest pain, angina, palpitations, dyspnea on exertion, peripheral edema.  Respiratory: Negative for dyspnea at rest, dyspnea on exertion, cough, sputum, wheezing.  GI: See history of present illness. GU:  Negative for dysuria, hematuria, urinary incontinence, urinary frequency, nocturnal urination.  Endo: Negative for unusual weight change.    Physical Examination:   BP 118/75  Pulse 81  Temp(Src) 97.2 F (36.2 C) (Temporal)  Ht 5\' 9"  (1.753 m)  Wt 210 lb 6.4 oz (95.437 kg)  BMI 31.07 kg/m2  General: Well-nourished, well-developed in no acute distress.  Eyes: No icterus. Mouth: Oropharyngeal mucosa moist and pink , no lesions erythema or exudate. Lungs: Clear to auscultation bilaterally.  Heart: Regular rate and rhythm, no murmurs rubs or gallops.  Abdomen: Bowel sounds are normal, nontender, nondistended, no hepatosplenomegaly or masses, no abdominal bruits or hernia , no rebound or guarding.   Extremities: No lower extremity edema. No clubbing or deformities. Neuro: Alert and oriented x 4   Skin: Warm and dry, no jaundice.   Psych: Alert and cooperative, normal mood and affect.

## 2011-02-23 NOTE — Progress Notes (Signed)
Cc to PCP 

## 2011-02-23 NOTE — Assessment & Plan Note (Signed)
Refractory GERD, symptoms entirely resolved with Nexium 40 mg twice a day. He has failed multiple PPIs and over-the-counter agents. Unfortunately has a significant heart history consisting of coronary artery disease, pacemaker/defibrillator and when he develops chest pain it is difficult for him to tell if this is related to GERD or cardiac in origin and therefore goes to the emergency department. In my opinion, given his multiple comorbidities, multiple medications he is in his best interest to continue Nexium 40 mg twice a day to control his reflux symptoms. Suspect he has a hypersensitive esophagus as the cause of his refractory symptoms.  If we cannot get his insurance company to cover the medication, we will consider pH/impedance study on Nexium once daily to prove the need for twice daily medication. Other potential possibilities would include adding a promotility agent given his borderline gastric emptying study last year. I will also discuss these findings with Dr. Jena Gauss.

## 2011-02-23 NOTE — Progress Notes (Signed)
Letter and chart notes sent to [facility] for appeal

## 2011-03-31 ENCOUNTER — Telehealth: Payer: Self-pay | Admitting: Internal Medicine

## 2011-03-31 NOTE — Telephone Encounter (Signed)
Please return patients call. He said that his insurance will not cover Nexium and he needs something else called in.  [phone] or [phone]

## 2011-03-31 NOTE — Telephone Encounter (Signed)
Lance [name] I wrote letter to insurance company requesting coverage for nexium. Can you please follow-up on this. It has been more than four weeks since I wrote the letter but I don't see any f/u correspondence????  We can give him samples in the interim. #[phone].

## 2011-03-31 NOTE — Telephone Encounter (Signed)
Let's keep track this time. If no response by next Wed. Let me know.

## 2011-03-31 NOTE — Telephone Encounter (Signed)
I mailed the original letter, but I had a copy and I have faxed the copy to [facility] and mailed the copy and all information a second time. I will give samples to the pt.

## 2011-04-01 NOTE — Telephone Encounter (Signed)
[employer] called today and spoke with LSL. This has been approved until 2013. We will have to renew PA prior to January. Pt is aware.

## 2011-04-06 ENCOUNTER — Ambulatory Visit: Payer: Medicare Other | Admitting: Cardiology

## 2011-05-03 ENCOUNTER — Encounter: Payer: Self-pay | Admitting: Gastroenterology

## 2011-05-12 DIAGNOSIS — J019 Acute sinusitis, unspecified: Secondary | ICD-10-CM | POA: Diagnosis not present

## 2011-05-12 DIAGNOSIS — R5381 Other malaise: Secondary | ICD-10-CM | POA: Diagnosis not present

## 2011-05-12 DIAGNOSIS — R5383 Other fatigue: Secondary | ICD-10-CM | POA: Diagnosis not present

## 2011-05-14 DIAGNOSIS — Z125 Encounter for screening for malignant neoplasm of prostate: Secondary | ICD-10-CM | POA: Diagnosis not present

## 2011-05-14 DIAGNOSIS — R5381 Other malaise: Secondary | ICD-10-CM | POA: Diagnosis not present

## 2011-05-14 DIAGNOSIS — R5383 Other fatigue: Secondary | ICD-10-CM | POA: Diagnosis not present

## 2011-05-20 ENCOUNTER — Other Ambulatory Visit: Payer: Self-pay | Admitting: Cardiology

## 2011-06-15 ENCOUNTER — Ambulatory Visit (HOSPITAL_COMMUNITY)
Admission: RE | Admit: 2011-06-15 | Discharge: 2011-06-15 | Disposition: A | Discharge status: Home / Self Care | Payer: Medicare Other | Source: Ambulatory Visit | Attending: Family Medicine | Admitting: Family Medicine

## 2011-06-15 ENCOUNTER — Other Ambulatory Visit: Payer: Self-pay | Admitting: Family Medicine

## 2011-06-15 DIAGNOSIS — J40 Bronchitis, not specified as acute or chronic: Secondary | ICD-10-CM | POA: Diagnosis not present

## 2011-06-15 DIAGNOSIS — M542 Cervicalgia: Secondary | ICD-10-CM | POA: Diagnosis not present

## 2011-06-15 DIAGNOSIS — R05 Cough: Secondary | ICD-10-CM | POA: Diagnosis not present

## 2011-06-15 DIAGNOSIS — R053 Chronic cough: Secondary | ICD-10-CM

## 2011-06-15 DIAGNOSIS — R059 Cough, unspecified: Secondary | ICD-10-CM | POA: Insufficient documentation

## 2011-06-15 DIAGNOSIS — M5000 Cervical disc disorder with myelopathy, unspecified cervical region: Secondary | ICD-10-CM | POA: Diagnosis not present

## 2011-06-15 DIAGNOSIS — G894 Chronic pain syndrome: Secondary | ICD-10-CM | POA: Diagnosis not present

## 2011-06-28 DIAGNOSIS — R413 Other amnesia: Secondary | ICD-10-CM | POA: Diagnosis not present

## 2011-06-28 DIAGNOSIS — M5412 Radiculopathy, cervical region: Secondary | ICD-10-CM | POA: Diagnosis not present

## 2011-06-29 ENCOUNTER — Other Ambulatory Visit: Payer: Self-pay | Admitting: Orthopaedic Surgery

## 2011-06-29 DIAGNOSIS — IMO0002 Reserved for concepts with insufficient information to code with codable children: Secondary | ICD-10-CM

## 2011-06-29 DIAGNOSIS — M542 Cervicalgia: Secondary | ICD-10-CM

## 2011-07-01 ENCOUNTER — Ambulatory Visit
Admission: RE | Admit: 2011-07-01 | Discharge: 2011-07-01 | Disposition: A | Discharge status: Home / Self Care | Payer: Medicare Other | Source: Ambulatory Visit | Attending: Orthopaedic Surgery | Admitting: Orthopaedic Surgery

## 2011-07-01 DIAGNOSIS — M5030 Other cervical disc degeneration, unspecified cervical region: Secondary | ICD-10-CM | POA: Diagnosis not present

## 2011-07-01 DIAGNOSIS — M47812 Spondylosis without myelopathy or radiculopathy, cervical region: Secondary | ICD-10-CM | POA: Diagnosis not present

## 2011-07-01 DIAGNOSIS — M542 Cervicalgia: Secondary | ICD-10-CM

## 2011-07-01 DIAGNOSIS — IMO0002 Reserved for concepts with insufficient information to code with codable children: Secondary | ICD-10-CM

## 2011-07-01 DIAGNOSIS — M25519 Pain in unspecified shoulder: Secondary | ICD-10-CM | POA: Diagnosis not present

## 2011-07-01 DIAGNOSIS — Z981 Arthrodesis status: Secondary | ICD-10-CM | POA: Diagnosis not present

## 2011-07-01 DIAGNOSIS — M5020 Other cervical disc displacement, unspecified cervical region: Secondary | ICD-10-CM | POA: Diagnosis not present

## 2011-07-01 MED ORDER — DIAZEPAM 5 MG PO TABS
5.0000 mg | ORAL_TABLET | Freq: Once | ORAL | Status: AC
  Administered 2011-07-01: 5 mg via ORAL

## 2011-07-01 MED ORDER — ONDANSETRON HCL 4 MG/2ML IJ SOLN: 4.0000 mg | Freq: Four times a day (QID) | INTRAMUSCULAR | Status: DC | PRN

## 2011-07-01 NOTE — Progress Notes (Signed)
Pt has been off aggrenox, trazadone and citalopram as ordered. Discharge instructions explained, consent signed and valium given

## 2011-07-06 DIAGNOSIS — E559 Vitamin D deficiency, unspecified: Secondary | ICD-10-CM | POA: Diagnosis not present

## 2011-07-06 DIAGNOSIS — I10 Essential (primary) hypertension: Secondary | ICD-10-CM | POA: Diagnosis not present

## 2011-07-06 DIAGNOSIS — IMO0001 Reserved for inherently not codable concepts without codable children: Secondary | ICD-10-CM | POA: Diagnosis not present

## 2011-07-06 DIAGNOSIS — E785 Hyperlipidemia, unspecified: Secondary | ICD-10-CM | POA: Diagnosis not present

## 2011-07-06 DIAGNOSIS — R413 Other amnesia: Secondary | ICD-10-CM | POA: Diagnosis not present

## 2011-07-07 ENCOUNTER — Encounter: Payer: Self-pay | Admitting: *Deleted

## 2011-07-07 ENCOUNTER — Encounter: Payer: Self-pay | Admitting: Internal Medicine

## 2011-07-07 ENCOUNTER — Encounter: Payer: Self-pay | Admitting: Cardiology

## 2011-07-07 ENCOUNTER — Ambulatory Visit (INDEPENDENT_AMBULATORY_CARE_PROVIDER_SITE_OTHER): Payer: Medicare Other | Admitting: *Deleted

## 2011-07-07 ENCOUNTER — Ambulatory Visit (INDEPENDENT_AMBULATORY_CARE_PROVIDER_SITE_OTHER): Payer: Medicare Other | Admitting: Cardiology

## 2011-07-07 VITALS — BP 164/96 | HR 76 | Ht 69.0 in | Wt 218.0 lb

## 2011-07-07 DIAGNOSIS — F17200 Nicotine dependence, unspecified, uncomplicated: Secondary | ICD-10-CM

## 2011-07-07 DIAGNOSIS — I10 Essential (primary) hypertension: Secondary | ICD-10-CM

## 2011-07-07 DIAGNOSIS — I428 Other cardiomyopathies: Secondary | ICD-10-CM

## 2011-07-07 DIAGNOSIS — I951 Orthostatic hypotension: Secondary | ICD-10-CM | POA: Diagnosis not present

## 2011-07-07 DIAGNOSIS — I2510 Atherosclerotic heart disease of native coronary artery without angina pectoris: Secondary | ICD-10-CM | POA: Diagnosis not present

## 2011-07-07 LAB — ICD DEVICE OBSERVATION
AL AMPLITUDE: 3.1 mv
AL IMPEDENCE ICD: 510 Ohm
AL THRESHOLD: 0.75 V
ATRIAL PACING ICD: 1 pct
BATTERY VOLTAGE: 2.49 V
CHARGE TIME: 16.6 s
DEV-0020ICD: NEGATIVE
DEVICE MODEL ICD: 147399
RV LEAD AMPLITUDE: 11.6 mv
RV LEAD IMPEDENCE ICD: 350 Ohm
RV LEAD THRESHOLD: 0.75 V
TOT-0006: 20110330000000
TOT-0007: 0
TOT-0008: 0
TOT-0009: 2
TOT-0010: 57
TZAT-0001FASTVT: 1
TZAT-0001SLOWVT: 1
TZAT-0004FASTVT: 8
TZAT-0004SLOWVT: 8
TZAT-0012FASTVT: 200 ms
TZAT-0012SLOWVT: 200 ms
TZAT-0013FASTVT: 1
TZAT-0013SLOWVT: 3
TZAT-0018FASTVT: NEGATIVE
TZAT-0018SLOWVT: NEGATIVE
TZAT-0019FASTVT: 7.5 V
TZAT-0019SLOWVT: 7.5 V
TZAT-0020FASTVT: 1 ms
TZAT-0020SLOWVT: 1 ms
TZON-0003FASTVT: 285 ms
TZON-0003SLOWVT: 330 ms
TZON-0004FASTVT: 12
TZON-0004SLOWVT: 12
TZON-0005FASTVT: 6
TZON-0005SLOWVT: 6
TZON-0010FASTVT: 80 ms
TZON-0010SLOWVT: 80 ms
TZST-0001FASTVT: 2
TZST-0001FASTVT: 3
TZST-0001FASTVT: 4
TZST-0001FASTVT: 5
TZST-0001SLOWVT: 2
TZST-0001SLOWVT: 3
TZST-0001SLOWVT: 4
TZST-0001SLOWVT: 5
TZST-0003FASTVT: 10 J
TZST-0003FASTVT: 25 J
TZST-0003FASTVT: 36 J
TZST-0003FASTVT: 36 J
TZST-0003SLOWVT: 10 J
TZST-0003SLOWVT: 25 J
TZST-0003SLOWVT: 36 J
TZST-0003SLOWVT: 36 J
VENTRICULAR PACING ICD: 1 pct

## 2011-07-07 NOTE — Progress Notes (Signed)
HPI Lance [name] comes in today for an evaluation and management cornered disease. He has had no angina or ischemic symptoms. He's been a lot of domestic stress lately because of problems with his stepdaughter. He had a difficult night last night he did not take his blood pressure meds. His blood pressure is elevated this morning.  His orthostatic symptoms have resolved. His blood pressure good control otherwise. He is compliant with medications.  He needs clearance for spine injection. He is only on aspirin.  Continues to smoke. He has been counseled numerous times on stopping.  Past Medical History  Diagnosis Date  . ASCVD (arteriosclerotic cardiovascular disease)     70% mid left anterior descending lesion on cath in 06/1995; left anterior desending DES placed in 8/03 and RCA stent in 9/03; captain 3/05 revealed 90% second marginal for which PCI was performed, 70% PDA and a total obstruction of the first diagonal and marginal; sudden cardiac death in [place] in 11-08-03 for which automatic implantable cardiac defibrillator placed; negativ stress nuclear 10/07  . Hypertension   . Hyperlipidemia   . Tobacco abuse     100 pack/year comsuption; cigarettes discontinued 2003; all tobacco products in 2008  . CVD (cerebrovascular disease) 05/2008    Transient ischemic attack; carotid ultrasound-plaque without focal disease  . Diabetes mellitus     Insulin requirement  . Degenerative joint disease 2002    C-spine fusion   . Erectile dysfunction   . Anxiety and depression   . Benign prostatic hypertrophy   . Other testicular hypofunction   . Esophageal reflux   . Diabetes mellitus without mention of complication   . Coronary artery disease   . Allergic rhinitis, cause unspecified   . S/P endoscopy Dec 2011    RMR: nl esophagus, hyperplastic polyp, active gastritis, no H.pylori.     Current Outpatient Prescriptions  Medication Sig Dispense Refill  . ALPRAZolam (XANAX) 1 MG tablet Take 1 mg  by mouth at bedtime as needed.        Marland Kitchen aspirin (ASPIR-LOW) 81 MG EC tablet Take 81 mg by mouth daily.        Marland Kitchen atorvastatin (LIPITOR) 80 MG tablet Take 80 mg by mouth daily.        . B-D ULTRAFINE III SHORT PEN 31G X 8 MM MISC As directed      . citalopram (CELEXA) 20 MG tablet Take 20 mg by mouth daily.        . diazepam (VALIUM) 5 MG tablet Take 5 mg by mouth daily.        Marland Kitchen dipyridamole-aspirin (AGGRENOX) 25-200 MG per 12 hr capsule Take 1 capsule by mouth daily.        Marland Kitchen esomeprazole (NEXIUM) 40 MG capsule Take 1 capsule (40 mg total) by mouth 2 (two) times daily before a meal.  180 capsule  1  . Exenatide (BYETTA 5 MCG PEN [place]) Inject 5 Units into the skin 2 (two) times daily.       . fexofenadine (ALLEGRA) 180 MG tablet Take 180 mg by mouth as needed.        . fish oil-omega-3 fatty acids 1000 MG capsule Take 1 g by mouth daily.        Marland Kitchen gabapentin (NEURONTIN) 300 MG capsule Take 300 mg by mouth 3 (three) times daily.        Marland Kitchen HYDROcodone-acetaminophen (NORCO) 7.5-325 MG per tablet Take 1 tablet by mouth as needed.        Marland Kitchen  hydrOXYzine (ATARAX) 25 MG tablet Take 25 mg by mouth 3 (three) times daily as needed.        . insulin glargine (LANTUS SOLOSTAR) 100 UNIT/ML injection Inject 70 Units into the skin at bedtime.       Marland Kitchen lisinopril (PRINIVIL,ZESTRIL) 20 MG tablet TAKE 1 TABLET BY MOUTH EVERY DAY  30 tablet  6  . losartan (COZAAR) 50 MG tablet Take 1 tablet by mouth daily.      . metFORMIN (GLUMETZA) 1000 MG (MOD) 24 hr tablet Take 1,000 mg by mouth 2 (two) times daily.        . metoprolol (LOPRESSOR) 50 MG tablet Take 1.5 tablets (75 mg total) by mouth 2 (two) times daily. Take 1 and 1/2 tablets twice a day  90 tablet  3  . nitroGLYCERIN (NITROSTAT) 0.4 MG SL tablet Place 0.4 mg under the tongue as directed.        Marland Kitchen NOVOLOG FLEXPEN 100 UNIT/ML injection 15 -18 UNITS 3 TIMES DAILY      . sulfamethoxazole-trimethoprim (BACTRIM DS) 800-160 MG per tablet as directed.      . tadalafil  (CIALIS) 10 MG tablet Take 10 mg by mouth as directed.        . traZODone (DESYREL) 50 MG tablet Take 50 mg by mouth at bedtime. Unsure of dosage  Has not taken today       . vitamin B-12 (CYANOCOBALAMIN) 1000 MCG tablet Take 1,000 mcg by mouth daily.        . insulin lispro (HUMALOG) 100 UNIT/ML injection Inject 12 Units into the skin 3 (three) times daily before meals. Sliding scale         Allergies  Allergen Reactions  . Penicillins Hives    Family History  Problem Relation Age of Onset  . Heart attack Other     Myocardial infarction    History   Social History  . Marital Status: Married    Spouse Name: N/A    Number of Children: N/A  . Years of Education: N/A   Occupational History  . Owner automotive business    Social History Main Topics  . Smoking status: Former Smoker    Types: Cigarettes  . Smokeless tobacco: Not on file   Comment: occasionally  . Alcohol Use: No  . Drug Use: No  . Sexually Active: Not on file   Other Topics Concern  . Not on file   Social History Narrative   Lives in [place] with his family    ROS ALL NEGATIVE EXCEPT THOSE NOTED IN HPI  PE  General Appearance: well developed, well nourished in no acute distress,Obese HEENT: symmetrical face, PERRLA, Poor dentition,  Neck: no JVD, thyromegaly, or adenopathy, trachea midline Chest: symmetric without deformity Cardiac: PMI non-displaced, RRR, normal S1, S2, no gallop or murmur Lung: clear to ausculation and percussion Vascular: all pulses full without bruits  Abdominal: nondistended, nontender, good bowel sounds, no HSM, no bruits Extremities: no cyanosis, clubbing or edema, no sign of DVT, no varicosities  Skin: normal color, no rashes Neuro: alert and oriented x 3, non-focal Pysch: normal affect  EKG  BMET    Component Value Date/Time   NA 133* 01/06/2011 0517   K 4.1 01/06/2011 0517   CL 99 01/06/2011 0517   CO2 25 01/06/2011 0517   GLUCOSE 186* 01/06/2011 0517   BUN 11  01/06/2011 0517   CREATININE 0.78 01/06/2011 0517   CALCIUM 9.4 01/06/2011 0517   GFRNONAA >60 01/06/2011 4098  GFRAA >60 01/06/2011 0517    Lipid Panel     Component Value Date/Time   CHOL  Value: 152        ATP III CLASSIFICATION:  <200     mg/dL   Desirable  161-096  mg/dL   Borderline High  >=045    mg/dL   High        40/01/8118 0458   TRIG 110 04/06/2010 0458   HDL 32* 04/06/2010 0458   CHOLHDL 4.8 04/06/2010 0458   VLDL 22 04/06/2010 0458   LDLCALC  Value: 98        Total Cholesterol/HDL:CHD Risk Coronary Heart Disease Risk Table                     Men   Women  1/2 Average Risk   3.4   3.3  Average Risk       5.0   4.4  2 X Average Risk   9.6   7.1  3 X Average Risk  23.4   11.0        Use the calculated Patient Ratio above and the CHD Risk Table to determine the patient's CHD Risk.        ATP III CLASSIFICATION (LDL):  <100     mg/dL   Optimal  147-829  mg/dL   Near or Above                    Optimal  130-159  mg/dL   Borderline  562-130  mg/dL   High  >865     mg/dL   Very High 78/08/6960 9528    CBC    Component Value Date/Time   WBC 4.8 04/08/2010 0536   RBC 4.37 04/08/2010 0536   HGB 12.9* 04/08/2010 0536   HCT 36.7* 04/08/2010 0536   PLT 114* 04/08/2010 0536   MCV 84.0 04/08/2010 0536   MCH 29.5 04/08/2010 0536   MCHC 35.1 04/08/2010 0536   RDW 13.2 04/08/2010 0536   LYMPHSABS 2.1 04/08/2010 0536   MONOABS 0.3 04/08/2010 0536   EOSABS 0.1 04/08/2010 0536   BASOSABS 0.0 04/08/2010 0536

## 2011-07-07 NOTE — Patient Instructions (Signed)
Your physician recommends that you continue on your current medications as directed. Please refer to the Current Medication list given to you today.   Your physician wants you to follow-up in: 1 year with Dr. Wall. You will receive a reminder letter in the mail two months in advance. If you don't receive a letter, please call our office to schedule the follow-up appointment.  

## 2011-07-07 NOTE — Assessment & Plan Note (Signed)
Resolved

## 2011-07-07 NOTE — Assessment & Plan Note (Signed)
Encouraged to quit. 

## 2011-07-07 NOTE — Assessment & Plan Note (Signed)
Stable. No change in medical therapy. Defibrillator checked his functioning properly. We'll see him back in a year. Defibrillator clinic in 6 months.

## 2011-07-07 NOTE — Progress Notes (Signed)
ICD check 

## 2011-07-07 NOTE — Assessment & Plan Note (Signed)
He says he is usually under good control. He did not take his meds last night because of increased domestic stress. Continue to monitor.

## 2011-07-08 ENCOUNTER — Other Ambulatory Visit: Payer: Self-pay | Admitting: Cardiology

## 2011-07-13 ENCOUNTER — Other Ambulatory Visit: Payer: Self-pay | Admitting: *Deleted

## 2011-07-13 DIAGNOSIS — E785 Hyperlipidemia, unspecified: Secondary | ICD-10-CM | POA: Diagnosis not present

## 2011-07-13 DIAGNOSIS — I10 Essential (primary) hypertension: Secondary | ICD-10-CM | POA: Diagnosis not present

## 2011-07-13 DIAGNOSIS — E119 Type 2 diabetes mellitus without complications: Secondary | ICD-10-CM | POA: Diagnosis not present

## 2011-07-13 DIAGNOSIS — E559 Vitamin D deficiency, unspecified: Secondary | ICD-10-CM | POA: Diagnosis not present

## 2011-07-13 MED ORDER — TADALAFIL 10 MG PO TABS: 10.0000 mg | ORAL_TABLET | ORAL | Status: DC

## 2011-07-28 ENCOUNTER — Telehealth: Payer: Self-pay | Admitting: Cardiology

## 2011-07-28 DIAGNOSIS — M542 Cervicalgia: Secondary | ICD-10-CM | POA: Diagnosis not present

## 2011-07-28 NOTE — Telephone Encounter (Signed)
New Problem:     Patient's wife called because he needs you to ok him to be off of his dipyridamole-aspirin (AGGRENOX) 25-200 MG per 12 hr capsule for seven days prior to his injection on April 16 at 9:20am.  Please call back if you have any questions.

## 2011-07-28 NOTE — Telephone Encounter (Signed)
LMTCB Debbie Bryelle Spiewak RN  

## 2011-07-30 NOTE — Telephone Encounter (Signed)
OK to stop.

## 2011-08-04 ENCOUNTER — Encounter: Payer: Self-pay | Admitting: *Deleted

## 2011-08-04 NOTE — Telephone Encounter (Signed)
Wife aware pt can stop Aggrenox 7 days prior to injection. Letter sent to Dr. Judie Petit. [place] RN

## 2011-08-11 DIAGNOSIS — B351 Tinea unguium: Secondary | ICD-10-CM | POA: Diagnosis not present

## 2011-08-17 DIAGNOSIS — M5030 Other cervical disc degeneration, unspecified cervical region: Secondary | ICD-10-CM | POA: Diagnosis not present

## 2011-08-17 DIAGNOSIS — IMO0002 Reserved for concepts with insufficient information to code with codable children: Secondary | ICD-10-CM | POA: Diagnosis not present

## 2011-08-18 ENCOUNTER — Encounter: Payer: Self-pay | Admitting: Internal Medicine

## 2011-08-18 ENCOUNTER — Ambulatory Visit (INDEPENDENT_AMBULATORY_CARE_PROVIDER_SITE_OTHER): Payer: Medicare Other | Admitting: *Deleted

## 2011-08-18 DIAGNOSIS — I428 Other cardiomyopathies: Secondary | ICD-10-CM

## 2011-08-18 LAB — ICD DEVICE OBSERVATION
AL AMPLITUDE: 3.1 mv
AL IMPEDENCE ICD: 510 Ohm
ATRIAL PACING ICD: 1 pct
BATTERY VOLTAGE: 2.46 V
CHARGE TIME: 0 s
DEV-0020ICD: NEGATIVE
DEVICE MODEL ICD: 147399
RV LEAD AMPLITUDE: 11.7 mv
RV LEAD IMPEDENCE ICD: 345 Ohm
TOT-0006: 20130306000000
TOT-0007: 0
TOT-0008: 0
TOT-0009: 2
TOT-0010: 57
TZAT-0001FASTVT: 1
TZAT-0001SLOWVT: 1
TZAT-0004FASTVT: 8
TZAT-0004SLOWVT: 8
TZAT-0012FASTVT: 200 ms
TZAT-0012SLOWVT: 200 ms
TZAT-0013FASTVT: 1
TZAT-0013SLOWVT: 3
TZAT-0018FASTVT: NEGATIVE
TZAT-0018SLOWVT: NEGATIVE
TZAT-0019FASTVT: 7.5 V
TZAT-0019SLOWVT: 7.5 V
TZAT-0020FASTVT: 1 ms
TZAT-0020SLOWVT: 1 ms
TZON-0003FASTVT: 285 ms
TZON-0003SLOWVT: 330 ms
TZON-0004FASTVT: 12
TZON-0004SLOWVT: 12
TZON-0005FASTVT: 6
TZON-0005SLOWVT: 6
TZON-0010FASTVT: 80 ms
TZON-0010SLOWVT: 80 ms
TZST-0001FASTVT: 2
TZST-0001FASTVT: 3
TZST-0001FASTVT: 4
TZST-0001FASTVT: 5
TZST-0001SLOWVT: 2
TZST-0001SLOWVT: 3
TZST-0001SLOWVT: 4
TZST-0001SLOWVT: 5
TZST-0003FASTVT: 10 J
TZST-0003FASTVT: 25 J
TZST-0003FASTVT: 36 J
TZST-0003FASTVT: 36 J
TZST-0003SLOWVT: 10 J
TZST-0003SLOWVT: 25 J
TZST-0003SLOWVT: 36 J
TZST-0003SLOWVT: 36 J
VENTRICULAR PACING ICD: 1 pct

## 2011-08-18 NOTE — Progress Notes (Signed)
ICD battery check only 

## 2011-08-25 ENCOUNTER — Encounter: Payer: Self-pay | Admitting: *Deleted

## 2011-08-25 ENCOUNTER — Other Ambulatory Visit: Payer: Self-pay | Admitting: *Deleted

## 2011-08-25 MED ORDER — METOPROLOL TARTRATE 50 MG PO TABS: 75.0000 mg | ORAL_TABLET | Freq: Two times a day (BID) | ORAL | Status: DC

## 2011-09-02 DIAGNOSIS — M1710 Unilateral primary osteoarthritis, unspecified knee: Secondary | ICD-10-CM | POA: Diagnosis not present

## 2011-09-02 DIAGNOSIS — IMO0002 Reserved for concepts with insufficient information to code with codable children: Secondary | ICD-10-CM | POA: Diagnosis not present

## 2011-09-06 DIAGNOSIS — R197 Diarrhea, unspecified: Secondary | ICD-10-CM | POA: Diagnosis not present

## 2011-09-13 DIAGNOSIS — R197 Diarrhea, unspecified: Secondary | ICD-10-CM | POA: Diagnosis not present

## 2011-09-14 DIAGNOSIS — R197 Diarrhea, unspecified: Secondary | ICD-10-CM | POA: Diagnosis not present

## 2011-09-15 DIAGNOSIS — R197 Diarrhea, unspecified: Secondary | ICD-10-CM | POA: Diagnosis not present

## 2011-10-05 ENCOUNTER — Encounter: Payer: Self-pay | Admitting: *Deleted

## 2011-10-05 ENCOUNTER — Encounter: Payer: Self-pay | Admitting: Internal Medicine

## 2011-10-05 ENCOUNTER — Ambulatory Visit (INDEPENDENT_AMBULATORY_CARE_PROVIDER_SITE_OTHER): Payer: Medicare Other | Admitting: Internal Medicine

## 2011-10-05 VITALS — BP 147/84 | HR 71 | Ht 69.0 in | Wt 224.0 lb

## 2011-10-05 DIAGNOSIS — Z4502 Encounter for adjustment and management of automatic implantable cardiac defibrillator: Secondary | ICD-10-CM | POA: Diagnosis not present

## 2011-10-05 DIAGNOSIS — I10 Essential (primary) hypertension: Secondary | ICD-10-CM | POA: Diagnosis not present

## 2011-10-05 NOTE — Assessment & Plan Note (Signed)
His device has reached elective replacement. I've discussed the treatment options with the patient and the risk, goals, benefits, and expectations of the procedure have been discussed with the patient and he wishes to proceed.
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
## 2011-10-05 NOTE — Patient Instructions (Addendum)
Pt is scheduled for a generator change on 10/28/11  See instruction sheet

## 2011-10-05 NOTE — Assessment & Plan Note (Signed)
His blood pressure today is minimally elevated. I've encouraged the patient to reduce his salt intake and not miss any of his medications. We'll consider up titration of his beta blocker if his blood pressure remains elevated.

## 2011-10-06 LAB — ICD DEVICE OBSERVATION
DEV-0020ICD: NEGATIVE
DEVICE MODEL ICD: 147399
TZAT-0001FASTVT: 1
TZAT-0001SLOWVT: 1
TZAT-0004FASTVT: 8
TZAT-0004SLOWVT: 8
TZAT-0012FASTVT: 200 ms
TZAT-0012SLOWVT: 200 ms
TZAT-0013FASTVT: 1
TZAT-0013SLOWVT: 3
TZAT-0018FASTVT: NEGATIVE
TZAT-0018SLOWVT: NEGATIVE
TZAT-0019FASTVT: 7.5 V
TZAT-0019SLOWVT: 7.5 V
TZAT-0020FASTVT: 1 ms
TZAT-0020SLOWVT: 1 ms
TZON-0003FASTVT: 285 ms
TZON-0003SLOWVT: 330 ms
TZON-0004FASTVT: 12
TZON-0004SLOWVT: 12
TZON-0005FASTVT: 6
TZON-0005SLOWVT: 6
TZON-0010FASTVT: 80 ms
TZON-0010SLOWVT: 80 ms
TZST-0001FASTVT: 2
TZST-0001FASTVT: 3
TZST-0001FASTVT: 4
TZST-0001FASTVT: 5
TZST-0001SLOWVT: 2
TZST-0001SLOWVT: 3
TZST-0001SLOWVT: 4
TZST-0001SLOWVT: 5
TZST-0003FASTVT: 10 J
TZST-0003FASTVT: 25 J
TZST-0003FASTVT: 36 J
TZST-0003FASTVT: 36 J
TZST-0003SLOWVT: 10 J
TZST-0003SLOWVT: 25 J
TZST-0003SLOWVT: 36 J
TZST-0003SLOWVT: 36 J

## 2011-10-13 ENCOUNTER — Ambulatory Visit (INDEPENDENT_AMBULATORY_CARE_PROVIDER_SITE_OTHER): Payer: Medicare Other | Admitting: Gastroenterology

## 2011-10-13 ENCOUNTER — Telehealth: Payer: Self-pay | Admitting: Gastroenterology

## 2011-10-13 ENCOUNTER — Encounter: Payer: Self-pay | Admitting: Gastroenterology

## 2011-10-13 VITALS — BP 142/87 | HR 75 | Temp 98.0°F | Ht 69.0 in | Wt 226.8 lb

## 2011-10-13 DIAGNOSIS — R109 Unspecified abdominal pain: Secondary | ICD-10-CM | POA: Diagnosis not present

## 2011-10-13 DIAGNOSIS — IMO0001 Reserved for inherently not codable concepts without codable children: Secondary | ICD-10-CM | POA: Diagnosis not present

## 2011-10-13 DIAGNOSIS — R197 Diarrhea, unspecified: Secondary | ICD-10-CM | POA: Diagnosis not present

## 2011-10-13 DIAGNOSIS — R1033 Periumbilical pain: Secondary | ICD-10-CM

## 2011-10-13 DIAGNOSIS — R110 Nausea: Secondary | ICD-10-CM | POA: Diagnosis not present

## 2011-10-13 DIAGNOSIS — E785 Hyperlipidemia, unspecified: Secondary | ICD-10-CM | POA: Diagnosis not present

## 2011-10-13 DIAGNOSIS — K219 Gastro-esophageal reflux disease without esophagitis: Secondary | ICD-10-CM

## 2011-10-13 DIAGNOSIS — E559 Vitamin D deficiency, unspecified: Secondary | ICD-10-CM | POA: Diagnosis not present

## 2011-10-13 DIAGNOSIS — I10 Essential (primary) hypertension: Secondary | ICD-10-CM | POA: Diagnosis not present

## 2011-10-13 NOTE — Assessment & Plan Note (Signed)
Postprandial abdominal cramps without BM. Nausea to smells but no vomiting. Left abd soreness. No urinary symptoms. No heartburn, doing well on Nexium BID. Recent CBC, CMET look good. Stool culture, O+P, lactoferrin, heme X 3 all negative. He is having softer stool but no real diarrhea.   Agree with need to rule out Celiac disease. Given severity of abdominal cramps, will proceed with CT A/P with contrast. ?need to rule out low grade CDiff given ABX in 06/2011. Could have post infectious IBS. Interestingly, he has gained 16 pounds since we saw him six months ago. Changed insulin a couple of months ago. Unlikely that he has mesenteric ischemia.  He inquires about when his next colonoscopy is due. He reports having it in [place]. We will try to get records.   Further recommendations to follow, after labs/CT.

## 2011-10-13 NOTE — Telephone Encounter (Signed)
Brett Canales from [facility] stated that no PAC was needed for CT abd they follow Medicare Guidelines

## 2011-10-13 NOTE — Patient Instructions (Addendum)
Please have your blood work done. We have scheduled you for a CT scan of your abdomen to further evaluate your symptoms.  We will request copy of your last colonoscopy report to determine when you will be due for next procedure.

## 2011-10-13 NOTE — Progress Notes (Signed)
Faxed to PCP
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
## 2011-10-14 LAB — TISSUE TRANSGLUTAMINASE, IGA: Tissue Transglutaminase Ab, IgA: 4.2 U/mL (ref <20)

## 2011-10-14 LAB — IGA: IgA: 302 mg/dL (ref 68–379)

## 2011-10-15 ENCOUNTER — Other Ambulatory Visit: Payer: Self-pay | Admitting: *Deleted

## 2011-10-15 DIAGNOSIS — I509 Heart failure, unspecified: Secondary | ICD-10-CM

## 2011-10-15 DIAGNOSIS — I4901 Ventricular fibrillation: Secondary | ICD-10-CM

## 2011-10-18 DIAGNOSIS — M5412 Radiculopathy, cervical region: Secondary | ICD-10-CM | POA: Diagnosis not present

## 2011-10-18 DIAGNOSIS — M542 Cervicalgia: Secondary | ICD-10-CM | POA: Diagnosis not present

## 2011-10-18 NOTE — Progress Notes (Signed)
Quick Note:  Celiac screen negative. Await CT. ______

## 2011-10-19 ENCOUNTER — Ambulatory Visit (HOSPITAL_COMMUNITY)
Admission: RE | Admit: 2011-10-19 | Discharge: 2011-10-19 | Disposition: A | Discharge status: Home / Self Care | Payer: Medicare Other | Source: Ambulatory Visit | Attending: Gastroenterology | Admitting: Gastroenterology

## 2011-10-19 ENCOUNTER — Ambulatory Visit: Payer: Medicare Other | Admitting: Gastroenterology

## 2011-10-19 ENCOUNTER — Encounter (HOSPITAL_COMMUNITY): Payer: Self-pay

## 2011-10-19 DIAGNOSIS — R110 Nausea: Secondary | ICD-10-CM | POA: Insufficient documentation

## 2011-10-19 DIAGNOSIS — R109 Unspecified abdominal pain: Secondary | ICD-10-CM | POA: Insufficient documentation

## 2011-10-19 DIAGNOSIS — I10 Essential (primary) hypertension: Secondary | ICD-10-CM | POA: Diagnosis not present

## 2011-10-19 DIAGNOSIS — N281 Cyst of kidney, acquired: Secondary | ICD-10-CM | POA: Diagnosis not present

## 2011-10-19 DIAGNOSIS — E119 Type 2 diabetes mellitus without complications: Secondary | ICD-10-CM | POA: Diagnosis not present

## 2011-10-19 MED ORDER — IOHEXOL 300 MG/ML  SOLN
100.0000 mL | Freq: Once | INTRAMUSCULAR | Status: AC | PRN
  Administered 2011-10-19: 100 mL via INTRAVENOUS

## 2011-10-20 ENCOUNTER — Encounter (HOSPITAL_COMMUNITY): Payer: Self-pay | Admitting: Pharmacy Technician

## 2011-10-20 DIAGNOSIS — IMO0001 Reserved for inherently not codable concepts without codable children: Secondary | ICD-10-CM | POA: Diagnosis not present

## 2011-10-20 DIAGNOSIS — E559 Vitamin D deficiency, unspecified: Secondary | ICD-10-CM | POA: Diagnosis not present

## 2011-10-20 DIAGNOSIS — I10 Essential (primary) hypertension: Secondary | ICD-10-CM | POA: Diagnosis not present

## 2011-10-20 DIAGNOSIS — E785 Hyperlipidemia, unspecified: Secondary | ICD-10-CM | POA: Diagnosis not present

## 2011-10-20 NOTE — Progress Notes (Signed)
Quick Note:  CT shows possible wall thickening of sigmoid colon. Large kidney cyst on left, slightly increased in size measuring 9X9cm.  Discussed with RMR. Recommends TCS.  Please schedule colonoscopy. Please verify that all of his DM meds are entered correctly. I know he has had some recent changes. He will continue sliding scale as before. Assuming entered meds are correct, then the day of prep he would take lantus 35 units at bedtime, metformin 500mg  BID, novolog 1/2 dose before meals, byetta 2.5 units bid.   Continue Aggrenox. ______

## 2011-10-21 NOTE — Progress Notes (Signed)
Quick Note:  Pt is aware, he said it was ok to set up tcs. We went over his DM medications. He is currently taking lantus 70u qhs, metformin 1000mg  bid, novolog 20u before breakfast, lunch and dinner- sliding scale, and byetta 5u bid.   Leighann, please schedule tcs with RMR- please see LSL DM medication adjustments. Pt said it was ok to give his wife all his information. ______

## 2011-10-21 NOTE — Progress Notes (Signed)
Quick Note:  Tried to call pt- LM with wife to return call. ______ 

## 2011-10-22 ENCOUNTER — Other Ambulatory Visit: Payer: Self-pay | Admitting: Internal Medicine

## 2011-10-22 DIAGNOSIS — I509 Heart failure, unspecified: Secondary | ICD-10-CM | POA: Diagnosis not present

## 2011-10-22 DIAGNOSIS — I2589 Other forms of chronic ischemic heart disease: Secondary | ICD-10-CM | POA: Diagnosis not present

## 2011-10-22 LAB — CBC
HCT: 39.5 % (ref 39.0–52.0)
Hemoglobin: 13.7 g/dL (ref 13.0–17.0)
MCH: 29 pg (ref 26.0–34.0)
MCHC: 34.7 g/dL (ref 30.0–36.0)
MCV: 83.7 fL (ref 78.0–100.0)
Platelets: 162 10*3/uL (ref 150–400)
RBC: 4.72 MIL/uL (ref 4.22–5.81)
RDW: 14.6 % (ref 11.5–15.5)
WBC: 5.2 10*3/uL (ref 4.0–10.5)

## 2011-10-22 LAB — BASIC METABOLIC PANEL
BUN: 13 mg/dL (ref 6–23)
CO2: 26 mEq/L (ref 19–32)
Calcium: 9.2 mg/dL (ref 8.4–10.5)
Chloride: 104 mEq/L (ref 96–112)
Creat: 0.95 mg/dL (ref 0.50–1.35)
Glucose, Bld: 158 mg/dL — ABNORMAL HIGH (ref 70–99)
Potassium: 4 mEq/L (ref 3.5–5.3)
Sodium: 138 mEq/L (ref 135–145)

## 2011-10-25 ENCOUNTER — Other Ambulatory Visit: Payer: Self-pay | Admitting: Internal Medicine

## 2011-10-25 DIAGNOSIS — R197 Diarrhea, unspecified: Secondary | ICD-10-CM

## 2011-10-25 DIAGNOSIS — R109 Unspecified abdominal pain: Secondary | ICD-10-CM

## 2011-10-25 NOTE — Progress Notes (Signed)
Patient is scheduled with RMR for TCS on 11/11/11 at 8:30 am and patient is aware instructions will be mailed to the patient .

## 2011-10-26 ENCOUNTER — Telehealth: Payer: Self-pay | Admitting: Internal Medicine

## 2011-10-26 ENCOUNTER — Other Ambulatory Visit: Payer: Self-pay | Admitting: Internal Medicine

## 2011-10-26 MED ORDER — PEG-KCL-NACL-NASULF-NA ASC-C 100 G PO SOLR: 1.0000 | Freq: Once | ORAL | Status: DC

## 2011-10-26 NOTE — Telephone Encounter (Signed)
Message copied by Glendora Score on Tue Oct 26, 2011  1:05 PM ------      Message from: Tiffany Kocher      Created: Mon Oct 25, 2011 12:35 PM       If you look under CT report you will see my instructions. Let me know if you have questions.       ----- Message -----         From: Glendora Score         Sent: 10/25/2011   9:34 AM           To: Tiffany Kocher, PA            Stephens Shire have Mr Yetta Barre scheduled for TCS w/RMR on 07/11 and could you please give me DM instructions, Thanks!!

## 2011-10-26 NOTE — Telephone Encounter (Signed)
I have Lance [name] scheduled for his colonoscopy on Thursday July 11th at 8:30 instructions and arrival time was mailed to the patient and his wife is aware

## 2011-10-27 ENCOUNTER — Encounter: Payer: Self-pay | Admitting: Gastroenterology

## 2011-10-27 DIAGNOSIS — Z4502 Encounter for adjustment and management of automatic implantable cardiac defibrillator: Secondary | ICD-10-CM | POA: Diagnosis not present

## 2011-10-27 DIAGNOSIS — I5022 Chronic systolic (congestive) heart failure: Secondary | ICD-10-CM | POA: Diagnosis not present

## 2011-10-27 DIAGNOSIS — I2589 Other forms of chronic ischemic heart disease: Secondary | ICD-10-CM | POA: Diagnosis not present

## 2011-10-27 DIAGNOSIS — Z8674 Personal history of sudden cardiac arrest: Secondary | ICD-10-CM | POA: Diagnosis not present

## 2011-10-27 DIAGNOSIS — I509 Heart failure, unspecified: Secondary | ICD-10-CM | POA: Diagnosis not present

## 2011-10-27 MED ORDER — SODIUM CHLORIDE 0.45 % IV SOLN
INTRAVENOUS | Status: DC
  Administered 2011-10-28: 07:00:00 via INTRAVENOUS

## 2011-10-27 MED ORDER — VANCOMYCIN HCL 1000 MG IV SOLR
1500.0000 mg | INTRAVENOUS | Status: DC
  Filled 2011-10-27 (×2): qty 1500

## 2011-10-27 MED ORDER — GENTAMICIN SULFATE 40 MG/ML IJ SOLN
80.0000 mg | INTRAMUSCULAR | Status: DC
  Filled 2011-10-27: qty 2

## 2011-10-28 ENCOUNTER — Ambulatory Visit (HOSPITAL_COMMUNITY)
Admission: RE | Admit: 2011-10-28 | Discharge: 2011-10-28 | Disposition: A | Discharge status: Home / Self Care | Payer: Medicare Other | Source: Ambulatory Visit | Attending: Internal Medicine | Admitting: Internal Medicine

## 2011-10-28 ENCOUNTER — Encounter (HOSPITAL_COMMUNITY)
Admission: RE | Disposition: A | Discharge status: Home / Self Care | Payer: Self-pay | Source: Ambulatory Visit | Attending: Internal Medicine

## 2011-10-28 DIAGNOSIS — I509 Heart failure, unspecified: Secondary | ICD-10-CM | POA: Insufficient documentation

## 2011-10-28 DIAGNOSIS — Z8674 Personal history of sudden cardiac arrest: Secondary | ICD-10-CM | POA: Insufficient documentation

## 2011-10-28 DIAGNOSIS — I4901 Ventricular fibrillation: Secondary | ICD-10-CM

## 2011-10-28 DIAGNOSIS — I2589 Other forms of chronic ischemic heart disease: Secondary | ICD-10-CM | POA: Insufficient documentation

## 2011-10-28 DIAGNOSIS — I5022 Chronic systolic (congestive) heart failure: Secondary | ICD-10-CM | POA: Insufficient documentation

## 2011-10-28 DIAGNOSIS — Z4502 Encounter for adjustment and management of automatic implantable cardiac defibrillator: Secondary | ICD-10-CM | POA: Insufficient documentation

## 2011-10-28 DIAGNOSIS — I469 Cardiac arrest, cause unspecified: Secondary | ICD-10-CM

## 2011-10-28 HISTORY — PX: IMPLANTABLE CARDIOVERTER DEFIBRILLATOR GENERATOR CHANGE: SHX5474

## 2011-10-28 LAB — SURGICAL PCR SCREEN
MRSA, PCR: NEGATIVE
Staphylococcus aureus: POSITIVE — AB

## 2011-10-28 LAB — PROTIME-INR
INR: 0.99 (ref 0.00–1.49)
Prothrombin Time: 13.3 seconds (ref 11.6–15.2)

## 2011-10-28 LAB — GLUCOSE, CAPILLARY
Glucose-Capillary: 106 mg/dL — ABNORMAL HIGH (ref 70–99)
Glucose-Capillary: 79 mg/dL (ref 70–99)

## 2011-10-28 SURGERY — IMPLANTABLE CARDIOVERTER DEFIBRILLATOR GENERATOR CHANGE
Anesthesia: LOCAL

## 2011-10-28 MED ORDER — ONDANSETRON HCL 4 MG/2ML IJ SOLN: 4.0000 mg | Freq: Four times a day (QID) | INTRAMUSCULAR | Status: DC | PRN

## 2011-10-28 MED ORDER — MIDAZOLAM HCL 5 MG/5ML IJ SOLN
INTRAMUSCULAR | Status: AC
  Filled 2011-10-28: qty 5

## 2011-10-28 MED ORDER — MUPIROCIN 2 % EX OINT
TOPICAL_OINTMENT | CUTANEOUS | Status: AC
  Administered 2011-10-28: 07:00:00 via NASAL
  Filled 2011-10-28: qty 22

## 2011-10-28 MED ORDER — FENTANYL CITRATE 0.05 MG/ML IJ SOLN
INTRAMUSCULAR | Status: AC
  Filled 2011-10-28: qty 2

## 2011-10-28 MED ORDER — LIDOCAINE HCL (PF) 1 % IJ SOLN
INTRAMUSCULAR | Status: AC
  Filled 2011-10-28: qty 60

## 2011-10-28 MED ORDER — ACETAMINOPHEN 325 MG PO TABS
325.0000 mg | ORAL_TABLET | ORAL | Status: DC | PRN
  Filled 2011-10-28: qty 2

## 2011-10-28 NOTE — Op Note (Signed)
NAME:  IKE, MARAGH NO.:  [id number]  MEDICAL RECORD NO.:  [id number]  LOCATION:  [facility]                         FACILITY:  [facility]  PHYSICIAN:  Doylene Canning. Ladona Ridgel, MD    DATE OF BIRTH:  08-Mar-1946  DATE OF PROCEDURE:  10/28/2011 DATE OF DISCHARGE:                              OPERATIVE REPORT   PROCEDURE PERFORMED:  Removal of previously implanted defibrillator and insertion of a new defibrillator, with defibrillation threshold testing.  INTRODUCTION:  The patient is a very pleasant 66 year old man with a history of resuscitated cardiac arrest, status post ICD insertion in 2005.  He has reached elective replacement indication on his device.  He is referred now for ICD generator removal and insertion of a new device.  OPERATIVE PROCEDURE:  After informed consent was obtained, the patient was taken to the diagnostic EP lab in a fasting state.  After usual preparation and draping, intravenous fentanyl and midazolam was given for sedation.  Lidocaine 30 mL was infiltrated into the left infraclavicular region.  A 7-cm incision was carried out over this region.  Electrocautery was utilized to dissect down to the fascia plane.  The ICD pocket was entered with electrocautery.  Electrocautery was utilized to free up the dense fibrous adhesions.  The generator was removed without difficulty.  The new St. [name] Ellipse DR dual-chamber ICD, serial number [id number], was connected to the atrial and defibrillation lead and placed back in the subcutaneous pocket.  The pocket was irrigated with antibiotic irrigation.  The incision was closed with 2-0 and 3-0 Vicryl.  Benzoin and Steri-Strips were painted on the incision and a pressure dressing was applied.  At this point, I scrubbed out of the case to supervise defibrillation threshold testing.  After the patient was more deeply sedated with fentanyl and Versed, VF was induced with a T-wave shock.  A 20-joule shock was  subsequently delivered, which terminated ventricular fibrillation and restored sinus rhythm.  At this point, no additional defibrillation threshold testing was carried out, and the patient was allowed to awaken and returned to his room in satisfactory condition.  COMPLICATIONS:  There were no immediate procedure complications.  RESULTS:  This demonstrates successful removal of previously implanted St. [name] dual-chamber ICD followed by successful insertion of a new dual- chamber ICD with defibrillation threshold testing.     Doylene Canning. Ladona Ridgel, MD     GWT/MEDQ  D:  10/28/2011  T:  10/28/2011  Job:  [id number]
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
## 2011-10-28 NOTE — Interval H&P Note (Signed)
History and Physical Interval Note:since I saw the patient last, he has reached ERI on his device and returns today for ICD generator change out.  10/28/2011 7:43 AM  Lance [name]  has presented today for surgery, with the diagnosis of End of life  The various methods of treatment have been discussed with the patient and family. After consideration of risks, benefits and other options for treatment, the patient has consented to  Procedure(s) (LRB): IMPLANTABLE CARDIOVERTER DEFIBRILLATOR GENERATOR CHANGE (N/A) as a surgical intervention .  The patient's history has been reviewed, patient examined, no change in status, stable for surgery.  I have reviewed the patients' chart and labs.  Questions were answered to the patient's satisfaction.     Lewayne Bunting

## 2011-10-28 NOTE — Progress Notes (Signed)
Surgical PCR swab positive for STAPH; Murpuricin sent home with pt with instructions BID x 5 days; Pt. And wife verbalize understanding

## 2011-10-28 NOTE — Op Note (Signed)
ICD generator removal and insertion of a new device with DFT testing without immediate complication. M#[id number].

## 2011-10-29 ENCOUNTER — Encounter: Payer: Medicare Other | Admitting: Internal Medicine

## 2011-11-02 ENCOUNTER — Encounter (HOSPITAL_COMMUNITY): Payer: Self-pay | Admitting: Pharmacy Technician

## 2011-11-10 ENCOUNTER — Encounter: Payer: Self-pay | Admitting: Internal Medicine

## 2011-11-10 ENCOUNTER — Ambulatory Visit (INDEPENDENT_AMBULATORY_CARE_PROVIDER_SITE_OTHER): Payer: Medicare Other | Admitting: *Deleted

## 2011-11-10 DIAGNOSIS — I428 Other cardiomyopathies: Secondary | ICD-10-CM | POA: Diagnosis not present

## 2011-11-10 MED ORDER — SODIUM CHLORIDE 0.45 % IV SOLN
Freq: Once | INTRAVENOUS | Status: AC
  Administered 2011-11-11: 08:00:00 via INTRAVENOUS

## 2011-11-10 NOTE — Progress Notes (Signed)
Wound check-ICD 

## 2011-11-11 ENCOUNTER — Encounter (HOSPITAL_COMMUNITY)
Admission: RE | Disposition: A | Discharge status: Home / Self Care | Payer: Self-pay | Source: Ambulatory Visit | Attending: Internal Medicine

## 2011-11-11 ENCOUNTER — Encounter (HOSPITAL_COMMUNITY): Payer: Self-pay | Admitting: *Deleted

## 2011-11-11 ENCOUNTER — Ambulatory Visit (HOSPITAL_COMMUNITY)
Admission: RE | Admit: 2011-11-11 | Discharge: 2011-11-11 | Disposition: A | Discharge status: Home / Self Care | Payer: Medicare Other | Source: Ambulatory Visit | Attending: Internal Medicine | Admitting: Internal Medicine

## 2011-11-11 DIAGNOSIS — Z79899 Other long term (current) drug therapy: Secondary | ICD-10-CM | POA: Diagnosis not present

## 2011-11-11 DIAGNOSIS — R933 Abnormal findings on diagnostic imaging of other parts of digestive tract: Secondary | ICD-10-CM | POA: Insufficient documentation

## 2011-11-11 DIAGNOSIS — I10 Essential (primary) hypertension: Secondary | ICD-10-CM | POA: Diagnosis not present

## 2011-11-11 DIAGNOSIS — R197 Diarrhea, unspecified: Secondary | ICD-10-CM

## 2011-11-11 DIAGNOSIS — Z01812 Encounter for preprocedural laboratory examination: Secondary | ICD-10-CM | POA: Diagnosis not present

## 2011-11-11 DIAGNOSIS — R109 Unspecified abdominal pain: Secondary | ICD-10-CM

## 2011-11-11 DIAGNOSIS — D126 Benign neoplasm of colon, unspecified: Secondary | ICD-10-CM | POA: Diagnosis not present

## 2011-11-11 HISTORY — PX: COLONOSCOPY: SHX5424

## 2011-11-11 LAB — GLUCOSE, CAPILLARY: Glucose-Capillary: 115 mg/dL — ABNORMAL HIGH (ref 70–99)

## 2011-11-11 SURGERY — COLONOSCOPY
Anesthesia: Moderate Sedation

## 2011-11-11 MED ORDER — STERILE WATER FOR IRRIGATION IR SOLN
Status: DC | PRN
  Administered 2011-11-11: 09:00:00

## 2011-11-11 MED ORDER — MIDAZOLAM HCL 5 MG/5ML IJ SOLN
INTRAMUSCULAR | Status: DC | PRN
  Administered 2011-11-11: 2 mg via INTRAVENOUS
  Administered 2011-11-11: 1 mg via INTRAVENOUS

## 2011-11-11 MED ORDER — MEPERIDINE HCL 100 MG/ML IJ SOLN
INTRAMUSCULAR | Status: AC
  Filled 2011-11-11: qty 2

## 2011-11-11 MED ORDER — MIDAZOLAM HCL 5 MG/5ML IJ SOLN
INTRAMUSCULAR | Status: AC
  Filled 2011-11-11: qty 10

## 2011-11-11 MED ORDER — MEPERIDINE HCL 100 MG/ML IJ SOLN
INTRAMUSCULAR | Status: DC | PRN
  Administered 2011-11-11: 25 mg via INTRAVENOUS
  Administered 2011-11-11: 50 mg via INTRAVENOUS

## 2011-11-11 NOTE — Op Note (Signed)
[facility] [address] [place], [place]  [address]  COLONOSCOPY PROCEDURE REPORT  PATIENT:  Lance [name], Lance [name]  MR#:  [id number] BIRTHDATE:  1946/03/16, 65 yrs. old  GENDER:  male ENDOSCOPIST:  R. Roetta Sessions, MD FACP [facility] REF. BY:  Lilyan Punt, M.D. PROCEDURE DATE:  11/11/2011 PROCEDURE:  Ileocolonoscopy biopsy and snare polypectomy  INDICATIONS:  Postprandial diarrhea. Abnormal sigmoid colon on CT. Negative celiac screen (serologies)  INFORMED CONSENT:  The risks, benefits, alternatives and imponderables including but not limited to bleeding, perforation as well as the possibility of a missed lesion have been reviewed. The potential for biopsy, lesion removal, etc. have also been discussed.  Questions have been answered.  All parties agreeable. Please see the history and physical in the medical record for more information.  MEDICATIONS:  Versed 3 mg IV and Demerol 75 mg IV in divided doses.  DESCRIPTION OF PROCEDURE:  After a digital rectal exam was performed, the EC-[id number] ([id number]) colonoscope was advanced from the anus through the rectum and colon to the area of the cecum, ileocecal valve and appendiceal orifice.  The cecum was deeply intubated.  These structures were well-seen and photographed for the record.  From the level of the cecum and ileocecal valve, the scope was slowly and cautiously withdrawn.  The mucosal surfaces were carefully surveyed utilizing scope tip deflection to facilitate fold flattening as needed.  The scope was pulled down into the rectum where a thorough examination including retroflexion was performed. <<PROCEDUREIMAGES>>  FINDINGS: Adequate preparation. The rectal mucosa appeared normal. The colonic mucosa appeared entirely normal aside from a 5 mm polyp in the midsigmoid and 2 diminutive polyps -- one at the splenic flexure and one in the cecum. The distal 10 cm of terminal ileal mucosa appeared normal.  THERAPEUTIC /  DIAGNOSTIC MANEUVERS PERFORMED:  The diminutive polyps mentioned above were cold biopsied/removed. The 5 mm polyp was cold snare removed. Also, segmental biopsies of the ascending and sigmoid segments were taken to evaluate for microscopic colitis.  COMPLICATIONS:  None  CECAL WITHDRAWAL TIME: 14 minutes  IMPRESSION: Colonic polyps-removed as described above. Segmental biopsies taken.  RECOMMENDATIONS: Followup on pathology.  ______________________________ R. Roetta Sessions, MD Caleen Essex  CC:  Lilyan Punt, M.D.  n. eSIGNED:   R. Roetta Sessions at 11/11/2011 09:16 AM  Sharyon Cable, [phone]

## 2011-11-11 NOTE — Interval H&P Note (Signed)
History and Physical Interval Note:  11/11/2011 8:38 AM  Lance [name]  has presented today for surgery, with the diagnosis of Abdominal Pain and Diarrhea  The various methods of treatment have been discussed with the patient and family. After consideration of risks, benefits and other options for treatment, the patient has consented to  Procedure(s) (LRB): COLONOSCOPY (N/A) as a surgical intervention .  The patient's history has been reviewed, patient examined, no change in status, stable for surgery.  I have reviewed the patients' chart and labs.  Questions were answered to the patient's satisfaction.     [name]
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
## 2011-11-12 LAB — ICD DEVICE OBSERVATION
AL AMPLITUDE: 3.4 mv
AL IMPEDENCE ICD: 450 Ohm
AL THRESHOLD: 1 V
ATRIAL PACING ICD: 0 pct
BAMS-0001: 180 {beats}/min
DEV-0020ICD: NEGATIVE
DEVICE MODEL ICD: 7041246
FVT: 0
HV IMPEDENCE: 38 Ohm
MODE SWITCH EPISODES: 0
PACEART VT: 0
RV LEAD AMPLITUDE: 9.2 mv
RV LEAD IMPEDENCE ICD: 300 Ohm
RV LEAD THRESHOLD: 1 V
TOT-0006: 20130627000000
TOT-0007: 1
TOT-0008: 0
TOT-0009: 0
TOT-0010: 1
TZAT-0001SLOWVT: 1
TZAT-0004SLOWVT: 8
TZAT-0012SLOWVT: 200 ms
TZAT-0013SLOWVT: 2
TZAT-0018SLOWVT: NEGATIVE
TZAT-0019SLOWVT: 7.5 V
TZAT-0020SLOWVT: 1 ms
TZON-0003SLOWVT: 320 ms
TZON-0004SLOWVT: 30
TZON-0005SLOWVT: 6
TZON-0010SLOWVT: 40 ms
TZST-0001SLOWVT: 2
TZST-0001SLOWVT: 3
TZST-0001SLOWVT: 4
TZST-0003SLOWVT: 30 J
TZST-0003SLOWVT: 36 J
VENTRICULAR PACING ICD: 0.03 pct
VF: 0

## 2011-11-14 ENCOUNTER — Encounter: Payer: Self-pay | Admitting: Internal Medicine

## 2011-11-15 ENCOUNTER — Encounter: Payer: Self-pay | Admitting: *Deleted

## 2011-11-15 ENCOUNTER — Encounter (HOSPITAL_COMMUNITY): Payer: Self-pay | Admitting: Internal Medicine

## 2011-11-17 DIAGNOSIS — IMO0001 Reserved for inherently not codable concepts without codable children: Secondary | ICD-10-CM | POA: Diagnosis not present

## 2011-11-17 DIAGNOSIS — I10 Essential (primary) hypertension: Secondary | ICD-10-CM | POA: Diagnosis not present

## 2011-11-17 DIAGNOSIS — E785 Hyperlipidemia, unspecified: Secondary | ICD-10-CM | POA: Diagnosis not present

## 2011-11-17 DIAGNOSIS — E669 Obesity, unspecified: Secondary | ICD-10-CM | POA: Diagnosis not present

## 2011-11-18 DIAGNOSIS — D510 Vitamin B12 deficiency anemia due to intrinsic factor deficiency: Secondary | ICD-10-CM | POA: Diagnosis not present

## 2011-11-18 DIAGNOSIS — K589 Irritable bowel syndrome without diarrhea: Secondary | ICD-10-CM | POA: Diagnosis not present

## 2011-11-18 DIAGNOSIS — R5381 Other malaise: Secondary | ICD-10-CM | POA: Diagnosis not present

## 2011-11-18 DIAGNOSIS — R5383 Other fatigue: Secondary | ICD-10-CM | POA: Diagnosis not present

## 2011-11-22 DIAGNOSIS — R0989 Other specified symptoms and signs involving the circulatory and respiratory systems: Secondary | ICD-10-CM | POA: Diagnosis not present

## 2011-11-22 DIAGNOSIS — R0609 Other forms of dyspnea: Secondary | ICD-10-CM | POA: Diagnosis not present

## 2011-11-22 DIAGNOSIS — IMO0001 Reserved for inherently not codable concepts without codable children: Secondary | ICD-10-CM | POA: Diagnosis not present

## 2011-11-22 DIAGNOSIS — D649 Anemia, unspecified: Secondary | ICD-10-CM | POA: Diagnosis not present

## 2011-11-22 DIAGNOSIS — R5381 Other malaise: Secondary | ICD-10-CM | POA: Diagnosis not present

## 2011-12-01 ENCOUNTER — Emergency Department (HOSPITAL_COMMUNITY): Payer: Medicare Other

## 2011-12-01 ENCOUNTER — Encounter (HOSPITAL_COMMUNITY): Payer: Self-pay | Admitting: Emergency Medicine

## 2011-12-01 ENCOUNTER — Observation Stay (HOSPITAL_COMMUNITY): Payer: Medicare Other

## 2011-12-01 ENCOUNTER — Observation Stay (HOSPITAL_COMMUNITY)
Admission: EM | Admit: 2011-12-01 | Discharge: 2011-12-02 | Disposition: A | Discharge status: Home / Self Care | Payer: Medicare Other | Attending: Internal Medicine | Admitting: Internal Medicine

## 2011-12-01 DIAGNOSIS — R4789 Other speech disturbances: Secondary | ICD-10-CM | POA: Insufficient documentation

## 2011-12-01 DIAGNOSIS — Z4502 Encounter for adjustment and management of automatic implantable cardiac defibrillator: Secondary | ICD-10-CM | POA: Diagnosis not present

## 2011-12-01 DIAGNOSIS — I10 Essential (primary) hypertension: Secondary | ICD-10-CM

## 2011-12-01 DIAGNOSIS — G459 Transient cerebral ischemic attack, unspecified: Principal | ICD-10-CM | POA: Diagnosis present

## 2011-12-01 DIAGNOSIS — E1159 Type 2 diabetes mellitus with other circulatory complications: Secondary | ICD-10-CM | POA: Diagnosis present

## 2011-12-01 DIAGNOSIS — Z95810 Presence of automatic (implantable) cardiac defibrillator: Secondary | ICD-10-CM | POA: Insufficient documentation

## 2011-12-01 DIAGNOSIS — F17200 Nicotine dependence, unspecified, uncomplicated: Secondary | ICD-10-CM | POA: Insufficient documentation

## 2011-12-01 DIAGNOSIS — R209 Unspecified disturbances of skin sensation: Secondary | ICD-10-CM | POA: Insufficient documentation

## 2011-12-01 DIAGNOSIS — I2510 Atherosclerotic heart disease of native coronary artery without angina pectoris: Secondary | ICD-10-CM | POA: Diagnosis present

## 2011-12-01 DIAGNOSIS — Z794 Long term (current) use of insulin: Secondary | ICD-10-CM | POA: Diagnosis not present

## 2011-12-01 DIAGNOSIS — I658 Occlusion and stenosis of other precerebral arteries: Secondary | ICD-10-CM | POA: Diagnosis not present

## 2011-12-01 DIAGNOSIS — R0602 Shortness of breath: Secondary | ICD-10-CM | POA: Diagnosis not present

## 2011-12-01 DIAGNOSIS — E119 Type 2 diabetes mellitus without complications: Secondary | ICD-10-CM | POA: Insufficient documentation

## 2011-12-01 LAB — CBC
HCT: 37.1 % — ABNORMAL LOW (ref 39.0–52.0)
Hemoglobin: 12.8 g/dL — ABNORMAL LOW (ref 13.0–17.0)
MCH: 29.6 pg (ref 26.0–34.0)
MCHC: 34.5 g/dL (ref 30.0–36.0)
MCV: 85.7 fL (ref 78.0–100.0)
Platelets: 140 10*3/uL — ABNORMAL LOW (ref 150–400)
RBC: 4.33 MIL/uL (ref 4.22–5.81)
RDW: 13.5 % (ref 11.5–15.5)
WBC: 4.6 10*3/uL (ref 4.0–10.5)

## 2011-12-01 LAB — BASIC METABOLIC PANEL
BUN: 12 mg/dL (ref 6–23)
CO2: 30 mEq/L (ref 19–32)
Calcium: 9.5 mg/dL (ref 8.4–10.5)
Chloride: 101 mEq/L (ref 96–112)
Creatinine, Ser: 1.08 mg/dL (ref 0.50–1.35)
GFR calc Af Amer: 82 mL/min — ABNORMAL LOW (ref >90)
GFR calc non Af Amer: 71 mL/min — ABNORMAL LOW (ref >90)
Glucose, Bld: 96 mg/dL (ref 70–99)
Potassium: 3.9 mEq/L (ref 3.5–5.1)
Sodium: 139 mEq/L (ref 135–145)

## 2011-12-01 LAB — RAPID URINE DRUG SCREEN, HOSP PERFORMED
Amphetamines: NOT DETECTED
Barbiturates: NOT DETECTED
Benzodiazepines: NOT DETECTED
Cocaine: NOT DETECTED
Opiates: NOT DETECTED
Tetrahydrocannabinol: NOT DETECTED

## 2011-12-01 LAB — GLUCOSE, CAPILLARY
Glucose-Capillary: 113 mg/dL — ABNORMAL HIGH (ref 70–99)
Glucose-Capillary: 208 mg/dL — ABNORMAL HIGH (ref 70–99)
Glucose-Capillary: 189 mg/dL — ABNORMAL HIGH (ref 70–99)

## 2011-12-01 LAB — TROPONIN I: Troponin I: <0.3 ng/mL (ref <0.30)

## 2011-12-01 MED ORDER — ASPIRIN 81 MG PO CHEW
324.0000 mg | CHEWABLE_TABLET | Freq: Once | ORAL | Status: AC
  Administered 2011-12-01: 324 mg via ORAL
  Filled 2011-12-01: qty 4

## 2011-12-01 MED ORDER — LOSARTAN POTASSIUM 50 MG PO TABS
50.0000 mg | ORAL_TABLET | Freq: Every day | ORAL | Status: DC
  Administered 2011-12-01 – 2011-12-02 (×2): 50 mg via ORAL
  Filled 2011-12-01 (×2): qty 1

## 2011-12-01 MED ORDER — PANTOPRAZOLE SODIUM 40 MG PO TBEC
40.0000 mg | DELAYED_RELEASE_TABLET | Freq: Every day | ORAL | Status: DC
  Administered 2011-12-02: 40 mg via ORAL
  Filled 2011-12-01 (×2): qty 1

## 2011-12-01 MED ORDER — HYOSCYAMINE SULFATE 0.125 MG SL SUBL
0.2500 mg | SUBLINGUAL_TABLET | Freq: Two times a day (BID) | SUBLINGUAL | Status: DC
  Administered 2011-12-01 – 2011-12-02 (×2): 0.25 mg via ORAL
  Filled 2011-12-01 (×3): qty 2

## 2011-12-01 MED ORDER — CITALOPRAM HYDROBROMIDE 20 MG PO TABS
20.0000 mg | ORAL_TABLET | Freq: Every day | ORAL | Status: DC
  Administered 2011-12-02: 20 mg via ORAL
  Filled 2011-12-01 (×2): qty 1

## 2011-12-01 MED ORDER — INSULIN GLARGINE 100 UNIT/ML ~~LOC~~ SOLN
60.0000 [IU] | Freq: Every day | SUBCUTANEOUS | Status: DC
  Administered 2011-12-01: 60 [IU] via SUBCUTANEOUS

## 2011-12-01 MED ORDER — ACETAMINOPHEN 325 MG PO TABS: 650.0000 mg | ORAL_TABLET | ORAL | Status: DC | PRN

## 2011-12-01 MED ORDER — INSULIN ASPART 100 UNIT/ML ~~LOC~~ SOLN: 0.0000 [IU] | Freq: Every day | SUBCUTANEOUS | Status: DC

## 2011-12-01 MED ORDER — ASPIRIN-DIPYRIDAMOLE ER 25-200 MG PO CP12
1.0000 | ORAL_CAPSULE | Freq: Two times a day (BID) | ORAL | Status: DC
  Administered 2011-12-01 – 2011-12-02 (×2): 1 via ORAL
  Filled 2011-12-01 (×5): qty 1

## 2011-12-01 MED ORDER — ALPRAZOLAM 1 MG PO TABS
1.0000 mg | ORAL_TABLET | Freq: Every day | ORAL | Status: DC
  Administered 2011-12-01: 1 mg via ORAL
  Filled 2011-12-01: qty 1

## 2011-12-01 MED ORDER — METOPROLOL TARTRATE 50 MG PO TABS
100.0000 mg | ORAL_TABLET | Freq: Two times a day (BID) | ORAL | Status: DC
  Administered 2011-12-01 – 2011-12-02 (×3): 100 mg via ORAL
  Filled 2011-12-01: qty 2
  Filled 2011-12-01: qty 1
  Filled 2011-12-01 (×2): qty 2

## 2011-12-01 MED ORDER — ENOXAPARIN SODIUM 40 MG/0.4ML ~~LOC~~ SOLN
40.0000 mg | SUBCUTANEOUS | Status: DC
  Administered 2011-12-01 – 2011-12-02 (×2): 40 mg via SUBCUTANEOUS
  Filled 2011-12-01 (×2): qty 0.4

## 2011-12-01 MED ORDER — INSULIN ASPART 100 UNIT/ML ~~LOC~~ SOLN
0.0000 [IU] | Freq: Three times a day (TID) | SUBCUTANEOUS | Status: DC
  Administered 2011-12-01 – 2011-12-02 (×2): 7 [IU] via SUBCUTANEOUS
  Administered 2011-12-02: 11 [IU] via SUBCUTANEOUS
  Administered 2011-12-02: 4 [IU] via SUBCUTANEOUS

## 2011-12-01 MED ORDER — TRAZODONE HCL 50 MG PO TABS
100.0000 mg | ORAL_TABLET | Freq: Every day | ORAL | Status: DC
  Administered 2011-12-01: 100 mg via ORAL
  Filled 2011-12-01: qty 2

## 2011-12-01 MED ORDER — SODIUM CHLORIDE 0.9 % IV SOLN: INTRAVENOUS | Status: DC

## 2011-12-01 MED ORDER — GABAPENTIN 300 MG PO CAPS
300.0000 mg | ORAL_CAPSULE | Freq: Four times a day (QID) | ORAL | Status: DC
  Administered 2011-12-01 – 2011-12-02 (×6): 300 mg via ORAL
  Filled 2011-12-01 (×6): qty 1
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
## 2011-12-01 NOTE — ED Notes (Signed)
Dr. Bebe Shaggy talking with pt and family

## 2011-12-01 NOTE — ED Notes (Signed)
Report given to Nadine, RN on 300 
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
## 2011-12-01 NOTE — ED Notes (Signed)
r arm started going numb x ago. Pt sound slurred speech, pt agrees that is doesn't sound his normal. Smile symmetrical. Moving all extremities. Pupils perrla. Arm drift negative. Alert/oriented. States when numbness initially started that he could not remember how to say words

## 2011-12-02 DIAGNOSIS — G459 Transient cerebral ischemic attack, unspecified: Secondary | ICD-10-CM | POA: Diagnosis not present

## 2011-12-02 DIAGNOSIS — I517 Cardiomegaly: Secondary | ICD-10-CM | POA: Diagnosis not present

## 2011-12-02 DIAGNOSIS — E119 Type 2 diabetes mellitus without complications: Secondary | ICD-10-CM | POA: Diagnosis not present

## 2011-12-02 DIAGNOSIS — R5381 Other malaise: Secondary | ICD-10-CM | POA: Diagnosis not present

## 2011-12-02 DIAGNOSIS — Z4502 Encounter for adjustment and management of automatic implantable cardiac defibrillator: Secondary | ICD-10-CM | POA: Diagnosis not present

## 2011-12-02 DIAGNOSIS — I10 Essential (primary) hypertension: Secondary | ICD-10-CM | POA: Diagnosis not present

## 2011-12-02 DIAGNOSIS — E785 Hyperlipidemia, unspecified: Secondary | ICD-10-CM | POA: Diagnosis not present

## 2011-12-02 LAB — LIPID PANEL
Cholesterol: 203 mg/dL — ABNORMAL HIGH (ref 0–200)
HDL: 31 mg/dL — ABNORMAL LOW (ref >39)
LDL Cholesterol: 131 mg/dL — ABNORMAL HIGH (ref 0–99)
Total CHOL/HDL Ratio: 6.5 RATIO
Triglycerides: 204 mg/dL — ABNORMAL HIGH (ref <150)
VLDL: 41 mg/dL — ABNORMAL HIGH (ref 0–40)

## 2011-12-02 LAB — GLUCOSE, CAPILLARY
Glucose-Capillary: 177 mg/dL — ABNORMAL HIGH (ref 70–99)
Glucose-Capillary: 253 mg/dL — ABNORMAL HIGH (ref 70–99)
Glucose-Capillary: 238 mg/dL — ABNORMAL HIGH (ref 70–99)

## 2011-12-02 LAB — HEMOGLOBIN A1C
Hgb A1c MFr Bld: 7.7 % — ABNORMAL HIGH (ref <5.7)
Mean Plasma Glucose: 174 mg/dL — ABNORMAL HIGH (ref <117)

## 2011-12-02 LAB — TSH: TSH: 2.434 u[IU]/mL (ref 0.350–4.500)

## 2011-12-02 MED ORDER — SIMVASTATIN 20 MG PO TABS
20.0000 mg | ORAL_TABLET | Freq: Every day | ORAL | Status: DC
  Administered 2011-12-02: 20 mg via ORAL
  Filled 2011-12-02: qty 1

## 2011-12-02 MED ORDER — ASPIRIN 81 MG PO CHEW: 81.0000 mg | CHEWABLE_TABLET | Freq: Every day | ORAL | Status: DC

## 2011-12-02 MED ORDER — ASPIRIN 81 MG PO CHEW
81.0000 mg | CHEWABLE_TABLET | Freq: Every day | ORAL | Status: DC
  Administered 2011-12-02: 81 mg via ORAL
  Filled 2011-12-02: qty 1

## 2011-12-02 MED ORDER — SIMVASTATIN 20 MG PO TABS: 20.0000 mg | ORAL_TABLET | Freq: Every day | ORAL | Status: DC

## 2011-12-02 NOTE — Consult Note (Signed)
NAME:  Lance [name], Lance [name] NO.:  [id number]  MEDICAL RECORD NO.:  [id number]  LOCATION:  A327                          FACILITY:  [facility]  PHYSICIAN:  Eliana Lueth A. Gerilyn Pilgrim, M.D. DATE OF BIRTH:  07-Jun-1945  DATE OF CONSULTATION: DATE OF DISCHARGE:                                CONSULTATION   HISTORY OF PRESENT ILLNESS:  The patient is a 66 year old man who presents with the acute onset of word-finding problems.  He just could not get the words out.  He knew what he wanted to say but just simply could not get it out.  The symptom lasted for no more than 30 minutes and was associated with tingling and numbness of the right upper extremity.  The patient has had some neck pain recently associated with right shoulder pain and headache.  He apparently has had some degenerative disk changes.  The patient apparently has had ischemic event many years ago.  He was told that he had a stroke at that time. He at that time presented with tunnel vision apparently as the presenting symptoms.  He has been on Aggrenox for many years.  Reports being compliant with this medication, taking it twice a day.  He does report feeling no energy, apparently lot of fatigue.  He has been checked for sleep apnea couple times, latest was about a year ago and was told that he does not have significant sleep apnea.  PHYSICAL EXAMINATION:  GENERAL:  Shows a pleasant, overweight man, in no acute distress. HEENT:  Head is normocephalic, atraumatic.  He does have a large tongue and does have crowded posterior airspace with evidence suggestive of sleep apnea. NECK: Supple. ABDOMEN:  Obese, but soft. EXTREMITIES:  No significant edema status post incisional scar, left knee.  NEUROLOGIC:  Mentation:  The patient is awake and alert.  He is lucid and coherent.  Language is normal today.  Comprehension, speech are all intact.  Cranial nerve evaluation, pupils are 4 mm and reactive to light and accommodation.   Visual fields are intact.  Extraocular movements are intact.  Facial muscle strength is symmetric.  Tongue is midline.  Uvula midline.  Shoulder shrugs normal.  Motor examination shows normal tone, bulk, and strength.  There is no pronator drift. Coordination shows no dysmetria.  There is no tremors, no past-pointing. Reflexes are slightly diminished throughout.  Plantars are both flexor. Sensation is symmetric to light touch and temperature.  Head CT scan is unremarkable for anything acute.  Carotid duplex Doppler shows no hemodynamic significant stenosis.  SIGNIFICANT LAB EVALUATION:  Total cholesterol 203, triglycerides 204, LDL high at 131, HDL 31.  ASSESSMENT:  Transient ischemic attack, likely involving left middle cerebral artery distribution.  Risk factors hyperlipidemia, diabetes, age, and a previous history of infarct.  RECOMMENDATION:  Reasonable to add a baby aspirin to the current regimen.  Continue with the Aggrenox.  The patient reports that he has never been on the statin medication and I will certainly recommend this.  Thanks for this consultation.     Charlisha Market A. Gerilyn Pilgrim, M.D.     KAD/MEDQ  D:  12/02/2011  T:  12/02/2011  Job:  [id number]

## 2011-12-02 NOTE — Progress Notes (Signed)
UR Chart Review Completed  

## 2011-12-02 NOTE — Progress Notes (Signed)
Discharge Summary: a/o.vss. Saline lock removed. Up ad lib. Telemetry d/c. No complaints. Discharge instructions given. Prescriptions given. Pt verbalized understanding of instructions.  Left floor ambulatory with nursing staff and spouse.

## 2011-12-02 NOTE — Progress Notes (Signed)
*  PRELIMINARY RESULTS* Echocardiogram 2D Echocardiogram has been performed.  Caswell Corwin 12/02/2011, 11:13 AM

## 2011-12-02 NOTE — Discharge Summary (Addendum)
Physician Discharge Summary  MARLEY CHARLOT MWU:[id number] DOB: 04/15/1946 DOA: 12/01/2011  PCP: Lilyan Punt, MD  Admit date: 12/01/2011 Discharge date: 12/02/2011  Recommendations for Outpatient Follow-up:  1. Follow up with neurologist Dr. Sandria Manly in 1-2 weeks 2. Follow up with cardiologist Dr. Daleen Squibb as scheduled 3. Follow up with primary care doctor in 2 weeks  Discharge Diagnoses:  Principal Problem:  *TIA (transient ischemic attack) Active Problems:  DIABETES MELLITUS, TYPE II  TOBACCO ABUSE  HYPERTENSION  ATHEROSCLEROTIC CARDIOVASCULAR DISEASE  ICD (implantable cardiac defibrillator) battery depletion   Discharge Condition: improved  Diet recommendation: low salt, low calorie  Wt Readings from Last 3 Encounters:  12/01/11 103.5 kg (228 lb 2.8 oz)  11/11/11 101.606 kg (224 lb)  11/11/11 101.606 kg (224 lb)    History of present illness:  This is a pleasant 66 year old gentleman with a history of stroke, ischemic cardiomyopathy, coronary artery disease, sudden cardiac death in Oct 01, 2003 for which he ICD was placed, diabetes, hypertension. Patient was in his usual state of health earlier today when he started to experience difficulty with his speech. Patient was having difficulty finding his words. Subsequently he began to developed right arm numbness and tingling. The symptoms lasted approximately 30 minutes after which it resolved. He did not describe any weakness anywhere else. He did not have any changes in his vision or difficulty swallowing. He was evaluated in the emergency room at which time his symptoms had resolved. It was not felt a candidate for TPA due to resolution of symptoms. He's been referred for admission for further workup of TIA.   Hospital Course:  This gentleman was admitted to the hospital with a transient expressive facies he is also right arm numbness. His symptoms had resolved prior to arrival to the hospital. Initial CT scan of the brain was negative for any  acute findings. Patient has an AICD in place which precluded an MRI. Carotid Dopplers admission a significant stenosis bilaterally. 2-D echocardiogram done which report for currently pending. Patient was seen by neurology and it was recommended to add a baby aspirin to his current antiplatelet regimen of Aggrenox. His LDL was also found to be elevated above goal, therefore a statin was recommended. Patient will be discharged on both of these 2 medications, and we will continue the remainder of his outpatient medications. He currently does not have any neurologic deficits. He is recommended to followup with his neurologist in one to 2 weeks. Echo will be followed up and if there are no acute findings found, then he will likely be discharged home later today.  Procedures:  none  Consultations:  Neurology, Dr. Gerilyn Pilgrim  Discharge Exam: Filed Vitals:   12/02/11 0803  BP:   Pulse: 63  Temp:   Resp:    Filed Vitals:   12/01/11 2200 12/02/11 0200 12/02/11 0600 12/02/11 0803  BP: 164/90 145/80 153/82   Pulse: 78 75 60 63  Temp: 98.4 F (36.9 C) 98 F (36.7 C) 98.1 F (36.7 C)   TempSrc: Oral Oral Oral   Resp: 18 18 20    Height:      Weight:      SpO2: 94% 99% 98% 94%    General: NAD Cardiovascular: s1, s2, rrr Respiratory: cta b  Discharge Instructions  Discharge Orders    Future Appointments: Provider: Department: Dept Phone: Center:   02/09/2012 9:15 AM Marinus Maw, MD [facility] [phone] [facility]     Medication List  As of 12/02/2011 12:21 PM   STOP  taking these medications         tadalafil 10 MG tablet         TAKE these medications         ALPRAZolam 1 MG tablet   Commonly known as: XANAX   Take 1 mg by mouth at bedtime. For sleep      aspirin 81 MG chewable tablet   Chew 1 tablet (81 mg total) by mouth daily.      BYETTA 5 MCG PEN [place]   Inject 5 Units into the skin 2 (two) times daily.      citalopram 20 MG tablet   Commonly known as:  CELEXA   Take 20 mg by mouth daily.      dipyridamole-aspirin 200-25 MG per 12 hr capsule   Commonly known as: AGGRENOX   Take 1 capsule by mouth 2 (two) times daily.      esomeprazole 40 MG capsule   Commonly known as: NEXIUM   Take 40 mg by mouth daily.      fexofenadine 180 MG tablet   Commonly known as: ALLEGRA   Take 180 mg by mouth daily as needed. For allergies      fluticasone 50 MCG/ACT nasal spray   Commonly known as: FLONASE   Place 2 sprays into the nose daily as needed. For allergies      gabapentin 300 MG capsule   Commonly known as: NEURONTIN   Take 300 mg by mouth 4 (four) times daily.      HYDROcodone-acetaminophen 7.5-325 MG per tablet   Commonly known as: NORCO   Take 1 tablet by mouth daily as needed. For pain      HYOSYNE PO   Take 0.1875-0.375 mg by mouth 2 (two) times daily. Takes one half to one whole tablet twice daily.      LANTUS SOLOSTAR 100 UNIT/ML injection   Generic drug: insulin glargine   Inject 60 Units into the skin at bedtime.      losartan 50 MG tablet   Commonly known as: COZAAR   Take 1 tablet by mouth daily.      metFORMIN 1000 MG tablet   Commonly known as: GLUCOPHAGE   Take 1,000 mg by mouth 2 (two) times daily.      metoprolol 100 MG tablet   Commonly known as: LOPRESSOR   Take 100 mg by mouth 2 (two) times daily.      nitroGLYCERIN 0.4 MG SL tablet   Commonly known as: NITROSTAT   Place 0.4 mg under the tongue every 5 (five) minutes as needed. For chest pain      NOVOLOG FLEXPEN 100 UNIT/ML injection   Generic drug: insulin aspart   Inject 15-18 Units into the skin 3 (three) times daily before meals. Uses according to sliding scale at home. 15 -18 UNITS 3 TIMES DAILY      simvastatin 20 MG tablet   Commonly known as: ZOCOR   Take 1 tablet (20 mg total) by mouth daily at 6 PM.      traZODone 100 MG tablet   Commonly known as: DESYREL   Take 100 mg by mouth at bedtime.      vitamin B-12 1000 MCG tablet   Commonly  known as: CYANOCOBALAMIN   Take 1,000 mcg by mouth daily.      Vitamin D (Ergocalciferol) 50000 UNITS Caps   Commonly known as: DRISDOL   Take 50,000 Units by mouth every 7 (seven) days. Mondays  Follow-up Information    Follow up with Evie Lacks, MD. (1-2 weeks)    Contact information:   [address] [place] [address] [phone]       Follow up with Lilyan Punt, MD. Schedule an appointment as soon as possible for a visit in 2 weeks.   Contact information:   [address] [place] [address] [phone]           The results of significant diagnostics from this hospitalization (including imaging, microbiology, ancillary and laboratory) are listed below for reference.    Significant Diagnostic Studies: Dg Chest 2 View  12/01/2011  *RADIOLOGY REPORT*  Clinical Data: Short of breath  CHEST - 2 VIEW  Comparison: 06/15/2011  Findings: Normal heart size.  Left subclavian dual lead AICD device stable.  Clear lungs.  No pneumothorax.  IMPRESSION: No active cardiopulmonary disease.  Original Report Authenticated By: Donavan Burnet, M.D.   Ct Head Wo Contrast  12/01/2011  *RADIOLOGY REPORT*  Clinical Data: Right-sided numbness.  CT HEAD WITHOUT CONTRAST  Technique:  Contiguous axial images were obtained from the base of the skull through the vertex without contrast.  Comparison: CT 02/26/2010  Findings: Ventricle size is normal.  Negative for hemorrhage or mass.  No acute infarct or edema in the brain.  No change from the prior study.  Calvarium is intact.  IMPRESSION: No acute abnormality.  Original Report Authenticated By: Camelia Phenes, M.D.   US Carotid Duplex Bilateral  12/01/2011  *RADIOLOGY REPORT*  Clinical Data: TIA, hypertension, smoking, coronary artery disease  BILATERAL CAROTID DUPLEX ULTRASOUND  Technique: Gray scale imaging, color Doppler and duplex ultrasound was performed of bilateral carotid and vertebral  arteries in the neck.  Comparison:  Carotid Doppler ultrasound - 02/26/2010; Head CT - earlier same day  Criteria:  Quantification of carotid stenosis is based on velocity parameters that correlate the residual internal carotid diameter with NASCET-based stenosis levels, using the diameter of the distal internal carotid lumen as the denominator for stenosis measurement.  The following velocity measurements were obtained:                   PEAK SYSTOLIC/END DIASTOLIC RIGHT ICA:                        60/20cm/sec CCA:                        51/10cm/sec SYSTOLIC ICA/CCA RATIO:     1.17 DIASTOLIC ICA/CCA RATIO:    2.09 ECA:                        112cm/sec  LEFT ICA:                        61/13cm/sec CCA:                        67/15cm/sec SYSTOLIC ICA/CCA RATIO:     0.91 DIASTOLIC ICA/CCA RATIO:    0.86 ECA:                        90cm/sec  Findings:  RIGHT CAROTID ARTERY: There is a minimal amount of eccentric non- echogenic plaque with the distal aspect of the right common carotid artery (image 11) and right carotid bulb (image 17) which does not result  in elevated peak systolic velocities within the right internal carotid artery to suggest hemodynamically significant stenosis.  RIGHT VERTEBRAL ARTERY:  Antegrade flow  LEFT CAROTID ARTERY: There is grossly unchanged very minimal eccentric echogenic plaque within the left carotid bulb which does not result in hemodynamically significant narrowing.  LEFT VERTEBRAL ARTERY:  Antegrade flow  IMPRESSION: Grossly unchanged minimal atherosclerotic plaque within the bilateral carotid bulbs, right subjectively more than left, not resulting in hemodynamically significant stenosis.  Original Report Authenticated By: Waynard Reeds, M.D.    Microbiology: No results found for this or any previous visit (from the past 240 hour(s)).   Labs: Basic Metabolic Panel:  Lab 12/01/11 4098  NA 139  K 3.9  CL 101  CO2 30  GLUCOSE 96  BUN 12  CREATININE 1.08  CALCIUM 9.5    MG --  PHOS --   Liver Function Tests: No results found for this basename: AST:5,ALT:5,ALKPHOS:5,BILITOT:5,PROT:5,ALBUMIN:5 in the last 168 hours No results found for this basename: LIPASE:5,AMYLASE:5 in the last 168 hours No results found for this basename: AMMONIA:5 in the last 168 hours CBC:  Lab 12/01/11 1002  WBC 4.6  NEUTROABS --  HGB 12.8*  HCT 37.1*  MCV 85.7  PLT 140*   Cardiac Enzymes:  Lab 12/01/11 1002  CKTOTAL --  CKMB --  CKMBINDEX --  TROPONINI <0.30   BNP: BNP (last 3 results) No results found for this basename: PROBNP:3 in the last 8760 hours CBG:  Lab 12/02/11 1127 12/02/11 0727 12/01/11 2114 12/01/11 1648 12/01/11 1015  GLUCAP 253* 177* 189* 208* 113*    Time coordinating discharge: greater than 30 minutes  Signed:  Aseneth Hack  [facility] 12/02/2011, 12:21 PM   Addendum:  Echo report reviewed.  No acute abnormalities noted. EF normal and no wall motion abnormalities.  Will proceed with discharge today
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
## 2011-12-06 ENCOUNTER — Ambulatory Visit (INDEPENDENT_AMBULATORY_CARE_PROVIDER_SITE_OTHER): Payer: Medicare Other | Admitting: Cardiology

## 2011-12-06 ENCOUNTER — Encounter: Payer: Self-pay | Admitting: Cardiology

## 2011-12-06 ENCOUNTER — Encounter: Payer: Self-pay | Admitting: Internal Medicine

## 2011-12-06 ENCOUNTER — Telehealth: Payer: Self-pay | Admitting: Cardiology

## 2011-12-06 ENCOUNTER — Ambulatory Visit (INDEPENDENT_AMBULATORY_CARE_PROVIDER_SITE_OTHER): Payer: Medicare Other | Admitting: *Deleted

## 2011-12-06 VITALS — BP 147/84 | HR 71 | Ht 69.0 in | Wt 224.0 lb

## 2011-12-06 DIAGNOSIS — Z4502 Encounter for adjustment and management of automatic implantable cardiac defibrillator: Secondary | ICD-10-CM

## 2011-12-06 DIAGNOSIS — R413 Other amnesia: Secondary | ICD-10-CM | POA: Diagnosis not present

## 2011-12-06 DIAGNOSIS — I2510 Atherosclerotic heart disease of native coronary artery without angina pectoris: Secondary | ICD-10-CM

## 2011-12-06 DIAGNOSIS — I428 Other cardiomyopathies: Secondary | ICD-10-CM

## 2011-12-06 DIAGNOSIS — E119 Type 2 diabetes mellitus without complications: Secondary | ICD-10-CM

## 2011-12-06 DIAGNOSIS — R55 Syncope and collapse: Secondary | ICD-10-CM | POA: Diagnosis not present

## 2011-12-06 DIAGNOSIS — R0989 Other specified symptoms and signs involving the circulatory and respiratory systems: Secondary | ICD-10-CM | POA: Diagnosis not present

## 2011-12-06 DIAGNOSIS — R0609 Other forms of dyspnea: Secondary | ICD-10-CM | POA: Diagnosis not present

## 2011-12-06 DIAGNOSIS — R079 Chest pain, unspecified: Secondary | ICD-10-CM

## 2011-12-06 DIAGNOSIS — R0600 Dyspnea, unspecified: Secondary | ICD-10-CM

## 2011-12-06 DIAGNOSIS — G459 Transient cerebral ischemic attack, unspecified: Secondary | ICD-10-CM | POA: Diagnosis not present

## 2011-12-06 LAB — ICD DEVICE OBSERVATION
AL AMPLITUDE: 4.1 mv
AL IMPEDENCE ICD: 400 Ohm
ATRIAL PACING ICD: 1 pct
BAMS-0001: 180 {beats}/min
CHARGE TIME: 7.5 s
DEV-0020ICD: NEGATIVE
DEVICE MODEL ICD: 7041246
HV IMPEDENCE: 39 Ohm
RV LEAD AMPLITUDE: 11.3 mv
RV LEAD IMPEDENCE ICD: 310 Ohm
TZAT-0001SLOWVT: 1
TZAT-0004SLOWVT: 8
TZAT-0012SLOWVT: 200 ms
TZAT-0013SLOWVT: 2
TZAT-0018SLOWVT: NEGATIVE
TZAT-0019SLOWVT: 7.5 V
TZAT-0020SLOWVT: 1 ms
TZON-0003SLOWVT: 320 ms
TZON-0004SLOWVT: 30
TZON-0005SLOWVT: 6
TZON-0010SLOWVT: 40 ms
TZST-0001SLOWVT: 2
TZST-0001SLOWVT: 3
TZST-0001SLOWVT: 4
TZST-0003SLOWVT: 30 J
TZST-0003SLOWVT: 36 J
VENTRICULAR PACING ICD: 1 pct

## 2011-12-06 NOTE — Assessment & Plan Note (Signed)
The patient states that he has severe dyspnea walking from one room to the next.  This may be an anginal equivalent.  He has not tried his nitroglycerin lingual spray.  He has not been having any orthopnea or paroxysmal nocturnal dyspnea.  As noted his recent chest x-ray showed no CHF

## 2011-12-06 NOTE — Progress Notes (Signed)
ICD check 

## 2011-12-06 NOTE — Telephone Encounter (Signed)
Pt calling to let dr wall know he was in [facility] with possible TIA over weekend --he stated he would be seeing neurology today at noon advised to let us know what neurologist found --pt agrees

## 2011-12-06 NOTE — Progress Notes (Signed)
[name] Date of Birth:  09/10/1945 [facility] [address] [place], [place]  [address] [phone]         Fax   [phone]  History of Present Illness: This pleasant 66 year old gentleman is seen as a work in the office visit.  He is a patient of Dr. Daleen Squibb.  Today he was being seen by Dr. Avie Echevaria for followup from a recent admission for TIA.  During his recent admission he had a two-dimensional echocardiogram on 12/02/11 which showed an ejection fraction of 55-60% with no wall motion abnormalities and he did have moderate LVH.  The patient has a history of diabetes, ischemic heart disease with previous multiple stents, and has an ICD in place with the most recent battery change in July 2013.  He was sent over today because of complaints of worsening dyspnea and fatigue and a feeling of exhaustion.  He had had a thorough workup including blood work and a pan last week which did not show any cause for this.  His thyroid functions were also noted to be normal.  His chest x-ray showed normal heart size and no evidence of CHF last week. Allergies  Allergen Reactions  . Penicillins Hives  . Other     Adhesive tape    Patient Active Problem List  Diagnosis  . DIABETES MELLITUS, TYPE II  . PITUITARY INSUFFICIENCY  . HYPOGONADISM, MALE  . HYPERLIPIDEMIA  . TOBACCO ABUSE  . DEPRESSION  . HYPERTENSION  . ATHEROSCLEROTIC CARDIOVASCULAR DISEASE  . TRANSIENT ISCHEMIC ATTACK  . HYPOTENSION, ORTHOSTATIC  . ALLERGIC RHINITIS  . ASTHMA, MILD  . GERD  . BENIGN PROSTATIC HYPERTROPHY  . ERECTILE DYSFUNCTION, ORGANIC  . SYNCOPE  . Chest pain at rest  . ICD (implantable cardiac defibrillator) battery depletion  . Nausea  . Left sided abdominal pain  . Abdominal pain, periumbilic  . TIA (transient ischemic attack)    History  Smoking status  . Former Smoker  . Types: Cigarettes  Smokeless tobacco  . Not on file  Comment: occasionally    History    Alcohol Use No    Family History  Problem Relation Age of Onset  . Heart attack Other     Myocardial infarction  . Colon cancer Neg Hx     Review of Systems: Constitutional: no fever chills diaphoresis or fatigue or change in weight.  Head and neck: no hearing loss, no epistaxis, no photophobia or visual disturbance. Respiratory: No cough, shortness of breath or wheezing. Cardiovascular: No chest pain peripheral edema, palpitations. Gastrointestinal: No abdominal distention, no abdominal pain, no change in bowel habits hematochezia or melena. Genitourinary: No dysuria, no frequency, no urgency, no nocturia. Musculoskeletal:No arthralgias, no back pain, no gait disturbance or myalgias. Neurological: No dizziness, no headaches, no numbness, no seizures, no syncope, no weakness, no tremors. Hematologic: No lymphadenopathy, no easy bruising. Psychiatric: No confusion, no hallucinations, no sleep disturbance.    Physical Exam: Filed Vitals:   12/06/11 1531  BP: 147/84  Pulse: 71   the general appearance reveals a large well-developed well-nourished gentleman in no acute distress.  He appears to be quite anxious.Pupils equal and reactive.   Extraocular Movements are full.  There is no scleral icterus.  The mouth and pharynx are normal.  The neck is supple.  The carotids reveal no bruits.  The jugular venous pressure is normal.  The thyroid is not enlarged.  There is no lymphadenopathy.  The chest is clear to percussion  and auscultation. There are no rales or rhonchi. Expansion of the chest is symmetrical.  The precordium is quiet.  The first heart sound is normal.  The second heart sound is physiologically split.  There is no murmur gallop rub or click.  There is no abnormal lift or heave.  The abdomen is soft and nontender. Bowel sounds are normal. The liver and spleen are not enlarged. There Are no abdominal masses. There are no bruits.  The pedal pulses are good.  There is no  phlebitis or edema.  There is no cyanosis or clubbing. The skin is warm and dry.  There is no rash. Strength is normal and symmetrical in all extremities.  There is no lateralizing weakness.  There are no sensory deficits.  EKG today shows normal sinus rhythm with premature atrial beats.  There are nonspecific T-wave abnormalities and prolonged QT interval unchanged from 12/01/11   Assessment / Plan: We are going to evaluate his dyspnea with a B. natruretic peptide and the patient will also return for a lexiscan Myoview stress test.  He will keep his regular followup appointment with Dr. Daleen Squibb

## 2011-12-06 NOTE — Patient Instructions (Signed)
Your physician has requested that you have a lexiscan myoview. For further information please visit [email or web address] Please follow instruction sheet, as given.  Will obtain labs today and call you with the results  Your physician recommends that you schedule a follow-up appointment in: 2 weeks with Dr Daleen Squibb

## 2011-12-06 NOTE — Assessment & Plan Note (Signed)
The patient has not been aware of any recent shocks from his ICD.  ICD was interrogated today by the EP staff and was shown to be functioning normally.

## 2011-12-06 NOTE — Assessment & Plan Note (Signed)
The patient denies any hypoglycemic episodes 

## 2011-12-06 NOTE — Telephone Encounter (Signed)
New problem   Patient wife calling c/o TIA last Tuesday admit to [facility]. C/o sob, weak, headache.

## 2011-12-07 LAB — BRAIN NATRIURETIC PEPTIDE: Pro B Natriuretic peptide (BNP): 18 pg/mL (ref 0.0–100.0)

## 2011-12-07 NOTE — Telephone Encounter (Signed)
Dr. Wall is aware. Debbie Myrth Dahan RN  

## 2011-12-08 ENCOUNTER — Encounter: Payer: Self-pay | Admitting: Internal Medicine

## 2011-12-09 ENCOUNTER — Telehealth: Payer: Self-pay | Admitting: Cardiology

## 2011-12-09 NOTE — Telephone Encounter (Signed)
Fu call °Pt returning your call  °

## 2011-12-09 NOTE — Telephone Encounter (Signed)
Message copied by Burnell Blanks on Thu Dec 09, 2011  4:11 PM ------      Message from: Lance [name]      Created: Tue Dec 07, 2011  9:20 PM       Please report.  BNP is normal so no evidence of CHF causing his dyspnea.

## 2011-12-09 NOTE — Telephone Encounter (Signed)
Advised wife of labs 

## 2011-12-10 ENCOUNTER — Ambulatory Visit: Payer: Medicare Other | Admitting: Cardiology

## 2011-12-10 NOTE — Telephone Encounter (Signed)
Wife aware pt will keep Lexiscan appt. Dr. Daleen Squibb aware Will wait results of Lexiscan Mylo Red RN

## 2011-12-10 NOTE — Telephone Encounter (Signed)
Will forward information to Caswell Corwin RN with Dr wall so he can review information regarding 2 week follow up (if needed or not)

## 2011-12-13 DIAGNOSIS — K589 Irritable bowel syndrome without diarrhea: Secondary | ICD-10-CM | POA: Diagnosis not present

## 2011-12-14 ENCOUNTER — Encounter (HOSPITAL_COMMUNITY): Payer: Self-pay | Admitting: Radiology

## 2011-12-14 NOTE — Progress Notes (Signed)
Patient ID: Lance [name], male   DOB: [birth year], 66 y.o.   MRN: [id number] Patient was scheduled for a Lexiscan Myoview 12/16/11, but study was cancelled, per Dr. Patty Sermons, due to patient needing to be off of Aggrenox for 72 hours, and he felt that couldn't happen at this time due to patient's recent TIA. The patient is scheduled for a follow-up visit in the Cardiology office when Dr. Daleen Squibb is in, so recent symptoms could be evaluated a bit further. Lance [name], [name]

## 2011-12-16 ENCOUNTER — Other Ambulatory Visit (HOSPITAL_COMMUNITY): Payer: Medicare Other

## 2011-12-23 ENCOUNTER — Encounter: Payer: Self-pay | Admitting: Internal Medicine

## 2011-12-23 ENCOUNTER — Telehealth: Payer: Self-pay | Admitting: Cardiology

## 2011-12-23 NOTE — Telephone Encounter (Signed)
I spoke with pt wife about rescheduling appt with PA for 01/04/12. Pt has no new edema, "just normal swelling of his knees" He has had "a little dyspnea but he doesn't really tell me about it" Mylo Red RN

## 2011-12-23 NOTE — Telephone Encounter (Signed)
New msg Pt's wife called and wanted to discuss appt for Monday

## 2011-12-24 ENCOUNTER — Ambulatory Visit (INDEPENDENT_AMBULATORY_CARE_PROVIDER_SITE_OTHER): Payer: Medicare Other | Admitting: Urgent Care

## 2011-12-24 ENCOUNTER — Encounter: Payer: Self-pay | Admitting: Urgent Care

## 2011-12-24 VITALS — BP 116/69 | HR 73 | Temp 97.5°F | Ht 69.0 in | Wt 224.8 lb

## 2011-12-24 DIAGNOSIS — R110 Nausea: Secondary | ICD-10-CM | POA: Diagnosis not present

## 2011-12-24 DIAGNOSIS — R197 Diarrhea, unspecified: Secondary | ICD-10-CM

## 2011-12-24 DIAGNOSIS — K219 Gastro-esophageal reflux disease without esophagitis: Secondary | ICD-10-CM | POA: Diagnosis not present

## 2011-12-24 DIAGNOSIS — R1033 Periumbilical pain: Secondary | ICD-10-CM

## 2011-12-24 MED ORDER — ALIGN PO CAPS: 1.0000 | ORAL_CAPSULE | Freq: Every day | ORAL | Status: AC

## 2011-12-24 MED ORDER — PANCRELIPASE (LIP-PROT-AMYL) 24000-76000 UNITS PO CPEP: ORAL_CAPSULE | ORAL | Status: DC

## 2011-12-24 NOTE — Patient Instructions (Addendum)
Please discuss follow up with LEFT KIDNEY CYST (slightly larger than before on last CT) w/ Dr Gerda Diss. Keep up the good work with your blood sugars Stop using tobacco Try Creon 24- 2 w/ meals & 1 w/ snacks-30 mins before eating A week later, add ALIGN probiotics daily Call with a progress report in 10 days Office visit in 6 months

## 2011-12-24 NOTE — Assessment & Plan Note (Signed)
Borderline GES last yr.  Suspect mild diabetic gastroparesis may be contributing factor.  May improve some with better glycemic control.

## 2011-12-24 NOTE — Assessment & Plan Note (Addendum)
Post-prandial abdominal pain & loose stools.  Suspect diabetic enteropathy/pancreatic insufficiency.  Continue levsin prn Keep up the good work with your blood sugars Stop using tobacco Try Creon 24- 2 w/ meals & 1 w/ snacks-30 mins before eating A week later, add ALIGN probiotics daily Call with a progress report in 10 days Office visit in 6 months  Please discuss follow up with LEFT KIDNEY CYST (slightly larger than before on last CT) w/ Dr Gerda Diss.

## 2011-12-24 NOTE — Assessment & Plan Note (Signed)
Well controlled on nexium 40 mg BID. 
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
## 2011-12-24 NOTE — Assessment & Plan Note (Signed)
Post-prandial generalized abdominal pain.  See diarrhea.  Cannot r/o biliary dyskinesia.  Consider HIDA if no relief w/ anti-spasmotics, probiotics & pancreatic enzymes.

## 2011-12-27 ENCOUNTER — Telehealth: Payer: Self-pay | Admitting: *Deleted

## 2011-12-27 ENCOUNTER — Ambulatory Visit: Payer: Medicare Other | Admitting: Physician Assistant

## 2011-12-27 ENCOUNTER — Ambulatory Visit (INDEPENDENT_AMBULATORY_CARE_PROVIDER_SITE_OTHER): Payer: Medicare Other | Admitting: Physician Assistant

## 2011-12-27 ENCOUNTER — Encounter: Payer: Self-pay | Admitting: *Deleted

## 2011-12-27 ENCOUNTER — Encounter: Payer: Self-pay | Admitting: Physician Assistant

## 2011-12-27 VITALS — BP 142/80 | HR 68 | Ht 69.0 in | Wt 230.0 lb

## 2011-12-27 DIAGNOSIS — R0602 Shortness of breath: Secondary | ICD-10-CM

## 2011-12-27 DIAGNOSIS — I2510 Atherosclerotic heart disease of native coronary artery without angina pectoris: Secondary | ICD-10-CM | POA: Diagnosis not present

## 2011-12-27 DIAGNOSIS — I10 Essential (primary) hypertension: Secondary | ICD-10-CM | POA: Diagnosis not present

## 2011-12-27 DIAGNOSIS — R079 Chest pain, unspecified: Secondary | ICD-10-CM

## 2011-12-27 DIAGNOSIS — E785 Hyperlipidemia, unspecified: Secondary | ICD-10-CM

## 2011-12-27 LAB — CBC WITH DIFFERENTIAL/PLATELET
Basophils Absolute: 0 10*3/uL (ref 0.0–0.1)
Basophils Relative: 0.6 % (ref 0.0–3.0)
Eosinophils Absolute: 0.1 10*3/uL (ref 0.0–0.7)
Eosinophils Relative: 1.6 % (ref 0.0–5.0)
HCT: 37.6 % — ABNORMAL LOW (ref 39.0–52.0)
Hemoglobin: 12.6 g/dL — ABNORMAL LOW (ref 13.0–17.0)
Lymphocytes Relative: 34.4 % (ref 12.0–46.0)
Lymphs Abs: 1.9 10*3/uL (ref 0.7–4.0)
MCHC: 33.6 g/dL (ref 30.0–36.0)
MCV: 87.8 fl (ref 78.0–100.0)
Monocytes Absolute: 0.5 10*3/uL (ref 0.1–1.0)
Monocytes Relative: 8.2 % (ref 3.0–12.0)
Neutro Abs: 3.1 10*3/uL (ref 1.4–7.7)
Neutrophils Relative %: 55.2 % (ref 43.0–77.0)
Platelets: 153 10*3/uL (ref 150.0–400.0)
RBC: 4.28 Mil/uL (ref 4.22–5.81)
RDW: 14.3 % (ref 11.5–14.6)
WBC: 5.6 10*3/uL (ref 4.5–10.5)

## 2011-12-27 LAB — BASIC METABOLIC PANEL
BUN: 13 mg/dL (ref 6–23)
CO2: 26 mEq/L (ref 19–32)
Calcium: 9.1 mg/dL (ref 8.4–10.5)
Chloride: 102 mEq/L (ref 96–112)
Creatinine, Ser: 0.9 mg/dL (ref 0.4–1.5)
GFR: 89.17 mL/min (ref >60.00)
Glucose, Bld: 147 mg/dL — ABNORMAL HIGH (ref 70–99)
Potassium: 4.2 mEq/L (ref 3.5–5.1)
Sodium: 137 mEq/L (ref 135–145)

## 2011-12-27 LAB — PROTIME-INR
INR: 1 ratio (ref 0.8–1.0)
Prothrombin Time: 10.5 s (ref 10.2–12.4)

## 2011-12-27 MED ORDER — NITROGLYCERIN 0.4 MG SL SUBL: 0.4000 mg | SUBLINGUAL_TABLET | SUBLINGUAL | Status: DC | PRN

## 2011-12-27 NOTE — Progress Notes (Signed)
Faxed to PCP

## 2011-12-27 NOTE — Progress Notes (Signed)
[address] [place], [place]  [address] Phone: [phone] Fax:  [phone]  Date:  12/27/2011   Name:  YOUSOF [name]   DOB:  1945-05-23   MRN:  [id number]  PCP:  Lilyan Punt, MD  Primary Cardiologist:  Dr. Valera Castle  Primary Electrophysiologist:  Dr. Lewayne Bunting    History of Present Illness: Lance [name] is a 66 y.o. male who returns for follow up.  He has a history of CAD, DM2, HL, HTN, TIA, BPH. He has previously undergone intervention of the LAD and RCA.   Cardiac catheterization 3/05 for unstable angina: oLM 30-40% proximal-mid LAD stent 20-30%, mid 40%, distal 30%, D1 subtotally occluded, AV circumflex 20-30% trivial OM1 occluded, OM2 ostial 90% and 70%, OM3 30-40%, mid RCA stent 30%, PDA ostial 60%, EF 55%. At that time, he had unsuccessful PCI of the D1. He did have a Cypher DES placed to the OM2.   He subsequently suffered sudden cardiac death out[place]state [facility]) in 2005 and underwent implantation of an AICD. Stress nuclear negative in 2007.  He was admitted in 7/13 with TIA symptoms. He is on Aggrenox and aspirin was added to his regimen.   Echo 7/13: Mild/moderate LVH, EF 55-60%, mildly calcified aortic valve annulus, MAC, mild LAE.   He was evaluated by Dr. Patty Sermons 12/06/11 with complaints of worsening dyspnea and fatigue chest x-ray was unremarkable. ECG was also unchanged. The patient was set up for a [employer]. However, he could not receive Lexiscan while on Aggrenox and his Myoview was canceled. Of note, BNP was also normal at 18. The patient notes progressively worsening dyspnea with exertion over the last several weeks. He also notes associated chest tightness. He denies any radiating symptoms. He does note associated nausea and diaphoresis. He has episodes that are consistent with PND. He denies orthopnea or pedal edema. He denies syncope.   Past Medical History  Diagnosis Date  . ASCVD (arteriosclerotic  cardiovascular disease)     70% mid left anterior descending lesion on cath in 06/1995; left anterior desending DES placed in 8/03 and RCA stent in 9/03; captain 3/05 revealed 90% second marginal for which PCI was performed, 70% PDA and a total obstruction of the first diagonal and marginal; sudden cardiac death in [place] in 11/16/03 for which automatic implantable cardiac defibrillator placed; negativ stress nuclear 10/07  . Hypertension   . Hyperlipidemia   . Tobacco abuse     100 pack/year comsuption; cigarettes discontinued 2003; all tobacco products in 2008  . CVD (cerebrovascular disease) 05/2008    Transient ischemic attack; carotid ultrasound-plaque without focal disease  . Diabetes mellitus     Insulin requirement  . Degenerative joint disease 2002    C-spine fusion   . Erectile dysfunction   . Anxiety and depression   . Benign prostatic hypertrophy   . Other testicular hypofunction   . Esophageal reflux   . Diabetes mellitus without mention of complication   . Coronary artery disease   . Allergic rhinitis, cause unspecified   . S/P endoscopy Dec 2011    RMR: nl esophagus, hyperplastic polyp, active gastritis, no H.pylori.   . ICD (implantable cardiac defibrillator) in place     Current Outpatient Prescriptions  Medication Sig Dispense Refill  . ALPRAZolam (XANAX) 1 MG tablet Take 1 mg by mouth at bedtime. For sleep      . aspirin 81 MG chewable tablet Chew 1 tablet (81 mg total) by mouth daily.  30 tablet  0  . bifidobacterium infantis (ALIGN) capsule Take 1 capsule by mouth daily.  14 capsule  0  . citalopram (CELEXA) 20 MG tablet Take 20 mg by mouth daily.        Marland Kitchen dipyridamole-aspirin (AGGRENOX) 25-200 MG per 12 hr capsule Take 1 capsule by mouth 2 (two) times daily.      Marland Kitchen esomeprazole (NEXIUM) 40 MG capsule Take 40 mg by mouth daily.       . Exenatide (BYETTA 5 MCG PEN [place]) Inject 5 Units into the skin 2 (two) times daily.       . fexofenadine (ALLEGRA) 180 MG tablet  Take 180 mg by mouth daily as needed. For allergies      . fluticasone (FLONASE) 50 MCG/ACT nasal spray Place 2 sprays into the nose daily as needed. For allergies      . gabapentin (NEURONTIN) 300 MG capsule Take 600 mg by mouth 2 (two) times daily.       Marland Kitchen HYDROcodone-acetaminophen (NORCO) 7.5-325 MG per tablet Take 1 tablet by mouth daily as needed. For pain      . Hyoscyamine Sulfate (HYOSYNE PO) Take 0.1875-0.375 mg by mouth 2 (two) times daily. Takes one half to one whole tablet twice daily.      . insulin glargine (LANTUS SOLOSTAR) 100 UNIT/ML injection Inject 50 Units into the skin at bedtime.       Marland Kitchen losartan (COZAAR) 50 MG tablet Take 1 tablet by mouth daily.      . metFORMIN (GLUCOPHAGE) 1000 MG tablet Take 1,000 mg by mouth 2 (two) times daily.      . metoprolol (LOPRESSOR) 100 MG tablet Take 100 mg by mouth 2 (two) times daily.      . nitroGLYCERIN (NITROSTAT) 0.4 MG SL tablet Place 0.4 mg under the tongue every 5 (five) minutes as needed. For chest pain      . NOVOLOG FLEXPEN 100 UNIT/ML injection Inject 15-18 Units into the skin 3 (three) times daily before meals. Uses according to sliding scale at home. 15 -18 UNITS 3 TIMES DAILY 15 in am 15 lunch 15 pm      . Pancrelipase, Lip-Prot-Amyl, (CREON) 24000 UNITS CPEP 2 PO w/ Meals 1 PO w/ snacks  300 capsule  5  . Pancrelipase, Lip-Prot-Amyl, (CREON) 24000 UNITS CPEP 2 w/ meals 1 w/ snacks  36 capsule  0  . simvastatin (ZOCOR) 20 MG tablet Take 1 tablet (20 mg total) by mouth daily at 6 PM.  30 tablet  1  . traZODone (DESYREL) 100 MG tablet Take 100 mg by mouth at bedtime.       . vitamin B-12 (CYANOCOBALAMIN) 1000 MCG tablet Take 1,000 mcg by mouth daily.        . Vitamin D, Ergocalciferol, (DRISDOL) 50000 UNITS CAPS Take 50,000 Units by mouth every 7 (seven) days. Mondays        Allergies: Allergies  Allergen Reactions  . Penicillins Hives  . Other     Adhesive tape    History  Substance Use Topics  . Smoking status:  Former Smoker    Types: Cigarettes  . Smokeless tobacco: Current User    Types: Chew   Comment: occasionally  . Alcohol Use: No     Family History  Problem Relation Age of Onset  . Heart attack Other     Myocardial infarction  . Colon cancer Neg Hx      ROS:  Please see the history of present illness.   He has  a chronic cough productive of white sputum. He has had chronic problems with abdominal pain and diarrhea as well as dysphagia and odynophagia. He is followed by gastroenterology.  All other systems reviewed and negative.   PHYSICAL EXAM: VS:  BP 142/80  Pulse 68  Ht 5\' 9"  (1.753 m)  Wt 230 lb (104.327 kg)  BMI 33.96 kg/m2 Well nourished, well developed, in no acute distress HEENT: normal Neck: no JVD Vascular: No carotid bruits Cardiac:  normal S1, S2; RRR; no murmur Lungs:  clear to auscultation bilaterally, no wheezing, rhonchi or rales Abd: soft, nontender, no hepatomegaly Ext: no edema Skin: warm and dry Neuro:  CNs 2-12 intact, no focal abnormalities noted  EKG:  Sinus rhythm, heart rate 68, leftward axis, nonspecific ST-T wave changes, no change from prior tracing      ASSESSMENT AND PLAN:  1. Chest Pain and Shortness of Breath His symptoms are suspicious for an anginal equivalent. He states his symptoms are very similar to what he experienced prior to his last PCI. We could not perform a Lexiscan Myoview while he was on Aggrenox. I have recommended cardiac catheterization to further evaluate. I discussed this with Dr. Daleen Squibb who agreed. We will arrange a right and left heart catheterization. Risks and benefits of cardiac catheterization have been discussed with the patient.  These include bleeding, infection, kidney damage, stroke, heart attack, death.  The patient understands these risks and is willing to proceed. Of note, he does take phosphodiesterase inhibitors. Therefore, I will not add long-acting nitrates. Of note, he did have chest pain walking in the office  today. We repeated his EKG and this was normal. His pain subsided with rest. We will proceed with cardiac catheterization later this week as planned. He has sublingual nitroglycerin to take if needed. He is to go the emergency room if he has worsening symptoms.  2. Coronary Artery Disease Continue aspirin and statin. Proceed with cardiac catheterization as noted.  3. History of TIAs Continue aspirin and Aggrenox. Of note, I did speak to Dr. Sandria Manly by telephone today. If needed, we can certainly change him to aspirin and Plavix.  4. Hyperlipidemia His LDL was 131 in 8/13. He was off of statin therapy. He is back on simvastatin. This is followed by his PCP. Goal LDL is less than or equal to 70.  5. Hypertension Borderline control. Continue to monitor.   6. Status post AICD No apparent therapies. Continue followup with Dr. Ladona Ridgel as scheduled.  SignedTereso Newcomer, PA-C  11:44 AM 12/27/2011

## 2011-12-27 NOTE — Patient Instructions (Addendum)
Your physician has requested that you have a cardiac catheterization, THIS IS A RIGHT AND LEFT HEART CATH WITH DR. Excell Seltzer 12/30/11 @ 10 AM IN THE [facility] PER SUSANNA IN [facility]. Cardiac catheterization is used to diagnose and/or treat various heart conditions. Doctors may recommend this procedure for a number of different reasons. The most common reason is to evaluate chest pain. Chest pain can be a symptom of coronary artery disease (CAD), and cardiac catheterization can show whether plaque is narrowing or blocking your heart's arteries. This procedure is also used to evaluate the valves, as well as measure the blood flow and oxygen levels in different parts of your heart. For further information please visit [email or web address] Please follow instruction sheet, as given. HOLD METFORMIN STARTING 12/29/11, HOLD 12/30/11 AND HOLD 8/30-8/31/13 AND RESTART BACK ON 01/02/12.  HOLD NOVOLOG THE MORNING OF CATH 12/30/11. YOU CAN TAKE YOUR BYETTA AS USUAL   Your physician recommends that you return for lab work in: PRE CATH LABS TODAY, BMET, CBC W/DIFF, PT/INR

## 2011-12-27 NOTE — Telephone Encounter (Signed)
Message copied by Tarri Fuller on Mon Dec 27, 2011  5:56 PM ------      Message from: [place], [place] T      Created: Mon Dec 27, 2011  5:43 PM       Labs ok for cath.      Tereso Newcomer, PA-C  5:43 PM 12/27/2011

## 2011-12-27 NOTE — Telephone Encounter (Signed)
pt notified of pre cath labs ok

## 2011-12-27 NOTE — H&P (Signed)
History and Physical  Date:  12/27/2011   Name:  Lance [name]   DOB:  06/16/1945   MRN:  [id number]  PCP:  Lilyan Punt, MD  Primary Cardiologist:  Dr. Valera Castle  Primary Electrophysiologist:  Dr. Lewayne Bunting    History of Present Illness: Lance [name] is a 66 y.o. male who returns for follow up.  He has a history of CAD, DM2, HL, HTN, TIA, BPH. He has previously undergone intervention of the LAD and RCA.   Cardiac catheterization 3/05 for unstable angina: oLM 30-40% proximal-mid LAD stent 20-30%, mid 40%, distal 30%, D1 subtotally occluded, AV circumflex 20-30% trivial OM1 occluded, OM2 ostial 90% and 70%, OM3 30-40%, mid RCA stent 30%, PDA ostial 60%, EF 55%. At that time, he had unsuccessful PCI of the D1. He did have a Cypher DES placed to the OM2.   He subsequently suffered sudden cardiac death out[place]of-state [facility]) in 2005 and underwent implantation of an AICD. Stress nuclear negative in 2007.  He was admitted in 7/13 with TIA symptoms. He is on Aggrenox and aspirin was added to his regimen.   Echo 7/13: Mild/moderate LVH, EF 55-60%, mildly calcified aortic valve annulus, MAC, mild LAE.   He was evaluated by Dr. Patty Sermons 12/06/11 with complaints of worsening dyspnea and fatigue chest x-ray was unremarkable. ECG was also unchanged. The patient was set up for a [employer]. However, he could not receive Lexiscan while on Aggrenox and his Myoview was canceled. Of note, BNP was also normal at 18. The patient notes progressively worsening dyspnea with exertion over the last several weeks. He also notes associated chest tightness. He denies any radiating symptoms. He does note associated nausea and diaphoresis. He has episodes that are consistent with PND. He denies orthopnea or pedal edema. He denies syncope.   Past Medical History  Diagnosis Date  . ASCVD (arteriosclerotic cardiovascular disease)     70% mid left anterior descending lesion on cath in 06/1995; left anterior  desending DES placed in 8/03 and RCA stent in 9/03; captain 3/05 revealed 90% second marginal for which PCI was performed, 70% PDA and a total obstruction of the first diagonal and marginal; sudden cardiac death in [place] in Nov 22, 2003 for which automatic implantable cardiac defibrillator placed; negativ stress nuclear 10/07  . Hypertension   . Hyperlipidemia   . Tobacco abuse     100 pack/year comsuption; cigarettes discontinued 2003; all tobacco products in 2008  . CVD (cerebrovascular disease) 05/2008    Transient ischemic attack; carotid ultrasound-plaque without focal disease  . Diabetes mellitus     Insulin requirement  . Degenerative joint disease 2002    C-spine fusion   . Erectile dysfunction   . Anxiety and depression   . Benign prostatic hypertrophy   . Other testicular hypofunction   . Esophageal reflux   . Diabetes mellitus without mention of complication   . Coronary artery disease   . Allergic rhinitis, cause unspecified   . S/P endoscopy Dec 2011    RMR: nl esophagus, hyperplastic polyp, active gastritis, no H.pylori.   . ICD (implantable cardiac defibrillator) in place     Current Outpatient Prescriptions  Medication Sig Dispense Refill  . ALPRAZolam (XANAX) 1 MG tablet Take 1 mg by mouth at bedtime. For sleep      . aspirin 81 MG chewable tablet Chew 1 tablet (81 mg total) by mouth daily.  30 tablet  0  . bifidobacterium infantis (ALIGN) capsule Take 1 capsule by mouth daily.  14 capsule  0  . citalopram (CELEXA) 20 MG tablet Take 20 mg by mouth daily.        Marland Kitchen dipyridamole-aspirin (AGGRENOX) 25-200 MG per 12 hr capsule Take 1 capsule by mouth 2 (two) times daily.      Marland Kitchen esomeprazole (NEXIUM) 40 MG capsule Take 40 mg by mouth daily.       . Exenatide (BYETTA 5 MCG PEN [place]) Inject 5 Units into the skin 2 (two) times daily.       . fexofenadine (ALLEGRA) 180 MG tablet Take 180 mg by mouth daily as needed. For allergies      . fluticasone (FLONASE) 50 MCG/ACT nasal  spray Place 2 sprays into the nose daily as needed. For allergies      . gabapentin (NEURONTIN) 300 MG capsule Take 600 mg by mouth 2 (two) times daily.       Marland Kitchen HYDROcodone-acetaminophen (NORCO) 7.5-325 MG per tablet Take 1 tablet by mouth daily as needed. For pain      . Hyoscyamine Sulfate (HYOSYNE PO) Take 0.1875-0.375 mg by mouth 2 (two) times daily. Takes one half to one whole tablet twice daily.      . insulin glargine (LANTUS SOLOSTAR) 100 UNIT/ML injection Inject 50 Units into the skin at bedtime.       Marland Kitchen losartan (COZAAR) 50 MG tablet Take 1 tablet by mouth daily.      . metFORMIN (GLUCOPHAGE) 1000 MG tablet Take 1,000 mg by mouth 2 (two) times daily.      . metoprolol (LOPRESSOR) 100 MG tablet Take 100 mg by mouth 2 (two) times daily.      . nitroGLYCERIN (NITROSTAT) 0.4 MG SL tablet Place 0.4 mg under the tongue every 5 (five) minutes as needed. For chest pain      . NOVOLOG FLEXPEN 100 UNIT/ML injection Inject 15-18 Units into the skin 3 (three) times daily before meals. Uses according to sliding scale at home. 15 -18 UNITS 3 TIMES DAILY 15 in am 15 lunch 15 pm      . Pancrelipase, Lip-Prot-Amyl, (CREON) 24000 UNITS CPEP 2 PO w/ Meals 1 PO w/ snacks  300 capsule  5  . Pancrelipase, Lip-Prot-Amyl, (CREON) 24000 UNITS CPEP 2 w/ meals 1 w/ snacks  36 capsule  0  . simvastatin (ZOCOR) 20 MG tablet Take 1 tablet (20 mg total) by mouth daily at 6 PM.  30 tablet  1  . traZODone (DESYREL) 100 MG tablet Take 100 mg by mouth at bedtime.       . vitamin B-12 (CYANOCOBALAMIN) 1000 MCG tablet Take 1,000 mcg by mouth daily.        . Vitamin D, Ergocalciferol, (DRISDOL) 50000 UNITS CAPS Take 50,000 Units by mouth every 7 (seven) days. Mondays        Allergies: Allergies  Allergen Reactions  . Penicillins Hives  . Other     Adhesive tape    History  Substance Use Topics  . Smoking status: Former Smoker    Types: Cigarettes  . Smokeless tobacco: Current User    Types: Chew   Comment:  occasionally  . Alcohol Use: No     Family History  Problem Relation Age of Onset  . Heart attack Other     Myocardial infarction  . Colon cancer Neg Hx      ROS:  Please see the history of present illness.   He has a chronic cough productive of white sputum. He has had chronic problems with abdominal  pain and diarrhea as well as dysphagia and odynophagia. He is followed by gastroenterology.  All other systems reviewed and negative.   PHYSICAL EXAM: VS:  BP 142/80  Pulse 68  Ht 5\' 9"  (1.753 m)  Wt 230 lb (104.327 kg)  BMI 33.96 kg/m2 Well nourished, well developed, in no acute distress HEENT: normal Neck: no JVD Vascular: No carotid bruits Cardiac:  normal S1, S2; RRR; no murmur Lungs:  clear to auscultation bilaterally, no wheezing, rhonchi or rales Abd: soft, nontender, no hepatomegaly Ext: no edema Skin: warm and dry Neuro:  CNs 2-12 intact, no focal abnormalities noted  EKG:  Sinus rhythm, heart rate 68, leftward axis, nonspecific ST-T wave changes, no change from prior tracing      ASSESSMENT AND PLAN:  1. Chest Pain and Shortness of Breath His symptoms are suspicious for an anginal equivalent. He states his symptoms are very similar to what he experienced prior to his last PCI. We could not perform a Lexiscan Myoview while he was on Aggrenox. I have recommended cardiac catheterization to further evaluate. I discussed this with Dr. Daleen Squibb who agreed. We will arrange a right and left heart catheterization. Risks and benefits of cardiac catheterization have been discussed with the patient.  These include bleeding, infection, kidney damage, stroke, heart attack, death.  The patient understands these risks and is willing to proceed. Of note, he does take phosphodiesterase inhibitors. Therefore, I will not add long-acting nitrates. Of note, he did have chest pain walking in the office today. We repeated his EKG and this was normal. His pain subsided with rest. We will proceed with  cardiac catheterization later this week as planned. He has sublingual nitroglycerin to take if needed. He is to go the emergency room if he has worsening symptoms.  2. Coronary Artery Disease Continue aspirin and statin. Proceed with cardiac catheterization as noted.  3. History of TIAs Continue aspirin and Aggrenox. Of note, I did speak to Dr. Sandria Manly by telephone today. If needed, we can certainly change him to aspirin and Plavix.  4. Hyperlipidemia His LDL was 131 in 8/13. He was off of statin therapy. He is back on simvastatin. This is followed by his PCP. Goal LDL is less than or equal to 70.  5. Hypertension Borderline control. Continue to monitor.   6. Status post AICD No apparent therapies. Continue followup with Dr. Ladona Ridgel as scheduled.  Signed, Tereso Newcomer, PA-C  11:44 AM 12/27/2011   I have taken a history, reviewed medications, allergies, PMH, SH, FH, and reviewed ROS and examined the patient.  I agree with the assessment and plan.  Shermika Balthaser C. Daleen Squibb, MD, [facility] Pager:  [phone]

## 2011-12-30 ENCOUNTER — Inpatient Hospital Stay (HOSPITAL_BASED_OUTPATIENT_CLINIC_OR_DEPARTMENT_OTHER)
Admission: RE | Admit: 2011-12-30 | Discharge: 2011-12-30 | Disposition: A | Discharge status: Home / Self Care | Payer: Medicare Other | Source: Ambulatory Visit | Attending: Cardiovascular Disease | Admitting: Cardiovascular Disease

## 2011-12-30 ENCOUNTER — Encounter (HOSPITAL_BASED_OUTPATIENT_CLINIC_OR_DEPARTMENT_OTHER)
Admission: RE | Disposition: A | Discharge status: Home / Self Care | Payer: Self-pay | Source: Ambulatory Visit | Attending: Cardiovascular Disease

## 2011-12-30 DIAGNOSIS — I2510 Atherosclerotic heart disease of native coronary artery without angina pectoris: Secondary | ICD-10-CM | POA: Diagnosis not present

## 2011-12-30 DIAGNOSIS — Z794 Long term (current) use of insulin: Secondary | ICD-10-CM | POA: Insufficient documentation

## 2011-12-30 DIAGNOSIS — R0602 Shortness of breath: Secondary | ICD-10-CM

## 2011-12-30 DIAGNOSIS — I10 Essential (primary) hypertension: Secondary | ICD-10-CM | POA: Diagnosis not present

## 2011-12-30 DIAGNOSIS — E785 Hyperlipidemia, unspecified: Secondary | ICD-10-CM | POA: Insufficient documentation

## 2011-12-30 DIAGNOSIS — R0609 Other forms of dyspnea: Secondary | ICD-10-CM | POA: Insufficient documentation

## 2011-12-30 DIAGNOSIS — I200 Unstable angina: Secondary | ICD-10-CM | POA: Insufficient documentation

## 2011-12-30 DIAGNOSIS — R0989 Other specified symptoms and signs involving the circulatory and respiratory systems: Secondary | ICD-10-CM | POA: Insufficient documentation

## 2011-12-30 DIAGNOSIS — Z95810 Presence of automatic (implantable) cardiac defibrillator: Secondary | ICD-10-CM | POA: Insufficient documentation

## 2011-12-30 DIAGNOSIS — Z8673 Personal history of transient ischemic attack (TIA), and cerebral infarction without residual deficits: Secondary | ICD-10-CM | POA: Insufficient documentation

## 2011-12-30 DIAGNOSIS — R079 Chest pain, unspecified: Secondary | ICD-10-CM

## 2011-12-30 DIAGNOSIS — E119 Type 2 diabetes mellitus without complications: Secondary | ICD-10-CM | POA: Insufficient documentation

## 2011-12-30 LAB — POCT I-STAT 3, ART BLOOD GAS (G3+)
Acid-Base Excess: 1 mmol/L (ref 0.0–2.0)
Bicarbonate: 26.6 mEq/L — ABNORMAL HIGH (ref 20.0–24.0)
O2 Saturation: 96 %
TCO2: 28 mmol/L (ref 0–100)
pCO2 arterial: 45.8 mmHg — ABNORMAL HIGH (ref 35.0–45.0)
pH, Arterial: 7.373 (ref 7.350–7.450)
pO2, Arterial: 85 mmHg (ref 80.0–100.0)

## 2011-12-30 LAB — POCT I-STAT 3, VENOUS BLOOD GAS (G3P V)
Acid-Base Excess: 2 mmol/L (ref 0.0–2.0)
Bicarbonate: 28.2 mEq/L — ABNORMAL HIGH (ref 20.0–24.0)
O2 Saturation: 64 %
TCO2: 30 mmol/L (ref 0–100)
pCO2, Ven: 51.6 mmHg — ABNORMAL HIGH (ref 45.0–50.0)
pH, Ven: 7.345 — ABNORMAL HIGH (ref 7.250–7.300)
pO2, Ven: 36 mmHg (ref 30.0–45.0)

## 2011-12-30 LAB — POCT I-STAT GLUCOSE
Glucose, Bld: 190 mg/dL — ABNORMAL HIGH (ref 70–99)
Operator id: 221371

## 2011-12-30 SURGERY — JV LEFT AND RIGHT HEART CATHETERIZATION WITH CORONARY ANGIOGRAM
Anesthesia: Moderate Sedation

## 2011-12-30 MED ORDER — SODIUM CHLORIDE 0.9 % IJ SOLN: 3.0000 mL | INTRAMUSCULAR | Status: DC | PRN

## 2011-12-30 MED ORDER — ONDANSETRON HCL 4 MG/2ML IJ SOLN: 4.0000 mg | Freq: Four times a day (QID) | INTRAMUSCULAR | Status: DC | PRN

## 2011-12-30 MED ORDER — ACETAMINOPHEN 325 MG PO TABS: 650.0000 mg | ORAL_TABLET | ORAL | Status: DC | PRN

## 2011-12-30 MED ORDER — SODIUM CHLORIDE 0.9 % IV SOLN
INTRAVENOUS | Status: DC
  Administered 2011-12-30: 09:00:00 via INTRAVENOUS

## 2011-12-30 MED ORDER — ASPIRIN 81 MG PO CHEW
324.0000 mg | CHEWABLE_TABLET | ORAL | Status: AC
  Administered 2011-12-30: 324 mg via ORAL

## 2011-12-30 MED ORDER — SODIUM CHLORIDE 0.9 % IV SOLN: 1.0000 mL/kg/h | INTRAVENOUS | Status: DC

## 2011-12-30 MED ORDER — SODIUM CHLORIDE 0.9 % IV SOLN
250.0000 mL | INTRAVENOUS | Status: DC | PRN
Start: 2011-12-30 — End: 2011-12-30

## 2011-12-30 MED ORDER — SODIUM CHLORIDE 0.9 % IJ SOLN: 3.0000 mL | Freq: Two times a day (BID) | INTRAMUSCULAR | Status: DC

## 2011-12-30 NOTE — OR Nursing (Signed)
Dr Cooper at bedside to discuss results and treatment plan with pt and family 

## 2011-12-30 NOTE — OR Nursing (Signed)
Tegaderm dressing applied, site level 0, bedrest begins at 1040 

## 2011-12-30 NOTE — CV Procedure (Signed)
   Cardiac Catheterization Procedure Note  Name: Lance [name] MRN: [id number] DOB: 1945-12-12  Procedure: Right Heart Cath, Left Heart Cath, Selective Coronary Angiography, LV angiography  Indication: Progressive angina and exertional dyspnea, CCS class III symptoms. This 66 year old gentleman has diabetes and known CAD. He has undergone previous stenting procedures. He's had progressive symptoms and was referred for right and left heart catheterization.   Procedural Details: The right groin was prepped, draped, and anesthetized with 1% lidocaine. Using the modified Seldinger technique a 4 French sheath was placed in the right femoral artery and a 6 French sheath was placed in the right femoral vein. A multipurpose catheter was used for the right heart catheterization. Standard protocol was followed for recording of right heart pressures and sampling of oxygen saturations. Fick cardiac output was calculated. Standard Judkins catheters were used for selective coronary angiography and left ventriculography. There were no immediate procedural complications. The patient was transferred to the post catheterization recovery area for further monitoring.  Procedural Findings: Hemodynamics RA A wave 13 V wave 11 mean of 10 RV 32/11 PA 27/15 with a mean of 19 PCWP A wave 14 V wave 14 mean of 12 LV 163/22 AO 161/90 with a mean of 121  Oxygen saturations: PA 64 AO 96  Cardiac Output (Fick) 5 Cardiac Index (Fick) 2.3   Coronary angiography: Coronary dominance: right  Left mainstem: The left main is patent. There is mild ostial narrowing of 30%. There was no pressure dampening with the catheter engaged in the left main. The left main is moderately calcified before it bifurcates into the LAD and left circumflex.  Left anterior descending (LAD): The LAD is heavily calcified. The proximal LAD is stented and there is 40-50% in-stent restenosis. Just beyond the ostium of the LAD but before the stented  segment, there is a focal 80% calcified stenosis. The LAD is then diffusely diseased into the mid vessel with 80% mid LAD stenosis. The first diagonal branch is small to moderate in caliber and it has calcific 75% proximal stenosis.  Left circumflex (LCx): The left circumflex is of moderate to large caliber. The proximal vessel is patent without significant stenosis. The first obtuse marginal is a large branching it has an 80-90% stenosis in its proximal aspect. The OM beyond that lesion is large in caliber and would be a good target for bypass. The AV groove circumflex beyond the OM is diffusely diseased and gives off a small second OM.  Right coronary artery (RCA): The right coronary artery has a high, anterior origin. It was selectively engaged with an AL-1 catheter. The vessel is diffusely diseased. There is moderate calcification of the RCA. The mid vessel has 40-50% stenosis. The distal vessel has diffuse 50% stenosis. The origin of the PDA branch has 90% stenosis. The posterior AV segment before 2 posterolateral branches has 80% stenosis.  Left ventriculography: Left ventricular systolic function is normal, LVEF is estimated at 55-65%, there is no significant mitral regurgitation   Final Conclusions:   1. Severe diffuse heavily calcified three-vessel coronary artery disease 2. Preserved LV function 3. Normal intracardiac filling pressures with the exception of elevated right atrial pressure  Recommendations: This 66 year old gentleman with diabetes has multivessel disease. He has class III symptoms. I think his best treatment strategy would be coronary bypass surgery considering the diffuse nature of his CAD. I am going to refer him to Dr. Dorris Fetch for consideration of CABG.  Lance [name] 12/30/2011, 10:31 AM

## 2011-12-30 NOTE — Interval H&P Note (Signed)
History and Physical Interval Note:  12/30/2011 9:48 AM  Lance [name]  has presented today for surgery, with the diagnosis of sob  The various methods of treatment have been discussed with the patient and family. After consideration of risks, benefits and other options for treatment, the patient has consented to  Procedure(s) (LRB): [facility] LEFT AND RIGHT HEART CATHETERIZATION WITH CORONARY ANGIOGRAM (N/A) as a surgical intervention .  The patient's history has been reviewed, patient examined, no change in status, stable for surgery.  I have reviewed the patient's chart and labs.  Questions were answered to the patient's satisfaction.     [name]

## 2012-01-04 ENCOUNTER — Other Ambulatory Visit: Payer: Self-pay | Admitting: *Deleted

## 2012-01-04 ENCOUNTER — Other Ambulatory Visit (HOSPITAL_COMMUNITY): Payer: Self-pay | Admitting: Radiology

## 2012-01-04 ENCOUNTER — Ambulatory Visit: Payer: Medicare Other | Admitting: Physician Assistant

## 2012-01-04 ENCOUNTER — Institutional Professional Consult (permissible substitution) (INDEPENDENT_AMBULATORY_CARE_PROVIDER_SITE_OTHER): Payer: Medicare Other | Admitting: Thoracic Surgery (Cardiothoracic Vascular Surgery)

## 2012-01-04 ENCOUNTER — Encounter: Payer: Self-pay | Admitting: Thoracic Surgery (Cardiothoracic Vascular Surgery)

## 2012-01-04 VITALS — BP 146/92 | HR 72 | Resp 18 | Ht 69.0 in | Wt 224.0 lb

## 2012-01-04 DIAGNOSIS — I2510 Atherosclerotic heart disease of native coronary artery without angina pectoris: Secondary | ICD-10-CM

## 2012-01-04 DIAGNOSIS — G459 Transient cerebral ischemic attack, unspecified: Secondary | ICD-10-CM | POA: Diagnosis not present

## 2012-01-04 DIAGNOSIS — E119 Type 2 diabetes mellitus without complications: Secondary | ICD-10-CM | POA: Diagnosis not present
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
## 2012-01-06 NOTE — Pre-Procedure Instructions (Addendum)
20 OWAIN ECKERMAN   01/06/2012   Your procedure is scheduled on: Monday, September 9th   Report to [facility] at  5:30 AM.             Surgery time is posted from 7:30 AM to 12:31 PM   Call this number if you have problems the morning of surgery: [phone]   Remember:   Do not eat food or drink any liquids:After Midnight Sunday.    Take these medicines the morning of surgery with A SIP OF WATER: Xanax, Celexa, Nexium, Flonase,              Norco (if needed), Metoprolol   Do not wear jewelry.  Do not wear lotions, powders, or cologne. You may NOT wear deodorant.   Men may shave face and neck.   Do not bring valuables to the hospital.  Contacts, dentures or bridgework may not be worn into surgery.   Leave suitcase in the car. After surgery it may be brought to your room.  For patients admitted to the hospital, checkout time is 11:00 AM the day of discharge.  Name and phone number of your driver:               Please bring back to hospital the Open Heart Booklet and the Incentive Spirometry, your family will give these to the              ICU nurse.                Special Instructions: CHG Shower Use Special Wash: 1/2 bottle night before surgery and 1/2 bottle morning of surgery.   Please read over the following fact sheets that you were given: Pain Booklet, Open Heart Packet, MRSA Information and Surgical Site Infection Prevention

## 2012-01-07 ENCOUNTER — Inpatient Hospital Stay (HOSPITAL_COMMUNITY)
Admission: RE | Admit: 2012-01-07 | Discharge: 2012-01-07 | Disposition: A | Discharge status: Home / Self Care | Payer: Medicare Other | Source: Ambulatory Visit | Attending: Thoracic Surgery (Cardiothoracic Vascular Surgery) | Admitting: Thoracic Surgery (Cardiothoracic Vascular Surgery)

## 2012-01-07 ENCOUNTER — Encounter (HOSPITAL_COMMUNITY): Payer: Self-pay

## 2012-01-07 ENCOUNTER — Encounter (HOSPITAL_COMMUNITY)
Admission: RE | Admit: 2012-01-07 | Discharge: 2012-01-07 | Disposition: A | Discharge status: Home / Self Care | Payer: Medicare Other | Source: Ambulatory Visit | Attending: Thoracic Surgery (Cardiothoracic Vascular Surgery) | Admitting: Thoracic Surgery (Cardiothoracic Vascular Surgery)

## 2012-01-07 ENCOUNTER — Ambulatory Visit (HOSPITAL_COMMUNITY)
Admission: RE | Admit: 2012-01-07 | Discharge: 2012-01-07 | Disposition: A | Discharge status: Home / Self Care | Payer: Medicare Other | Source: Ambulatory Visit | Attending: Thoracic Surgery (Cardiothoracic Vascular Surgery) | Admitting: Thoracic Surgery (Cardiothoracic Vascular Surgery)

## 2012-01-07 VITALS — BP 138/87 | HR 74 | Temp 98.7°F | Resp 18 | Ht 69.0 in | Wt 223.8 lb

## 2012-01-07 DIAGNOSIS — I2510 Atherosclerotic heart disease of native coronary artery without angina pectoris: Secondary | ICD-10-CM

## 2012-01-07 DIAGNOSIS — Z01818 Encounter for other preprocedural examination: Secondary | ICD-10-CM | POA: Insufficient documentation

## 2012-01-07 DIAGNOSIS — Z01810 Encounter for preprocedural cardiovascular examination: Secondary | ICD-10-CM | POA: Diagnosis not present

## 2012-01-07 DIAGNOSIS — Z01812 Encounter for preprocedural laboratory examination: Secondary | ICD-10-CM | POA: Insufficient documentation

## 2012-01-07 LAB — URINALYSIS, ROUTINE W REFLEX MICROSCOPIC
Bilirubin Urine: NEGATIVE
Glucose, UA: 500 mg/dL — AB
Hgb urine dipstick: NEGATIVE
Ketones, ur: NEGATIVE mg/dL
Leukocytes, UA: NEGATIVE
Nitrite: NEGATIVE
Protein, ur: NEGATIVE mg/dL
Specific Gravity, Urine: 1.031 — ABNORMAL HIGH (ref 1.005–1.030)
Urobilinogen, UA: 0.2 mg/dL (ref 0.0–1.0)
pH: 5.5 (ref 5.0–8.0)

## 2012-01-07 LAB — BLOOD GAS, ARTERIAL
Acid-Base Excess: 0.7 mmol/L (ref 0.0–2.0)
Bicarbonate: 24.8 mEq/L — ABNORMAL HIGH (ref 20.0–24.0)
Drawn by: 344381
FIO2: 0.21 %
O2 Saturation: 96.2 %
Patient temperature: 98.6
TCO2: 26 mmol/L (ref 0–100)
pCO2 arterial: 39.7 mmHg (ref 35.0–45.0)
pH, Arterial: 7.412 (ref 7.350–7.450)
pO2, Arterial: 84.9 mmHg (ref 80.0–100.0)

## 2012-01-07 LAB — COMPREHENSIVE METABOLIC PANEL
ALT: 19 U/L (ref 0–53)
AST: 16 U/L (ref 0–37)
Albumin: 3.6 g/dL (ref 3.5–5.2)
Alkaline Phosphatase: 69 U/L (ref 39–117)
BUN: 15 mg/dL (ref 6–23)
CO2: 23 mEq/L (ref 19–32)
Calcium: 9.8 mg/dL (ref 8.4–10.5)
Chloride: 100 mEq/L (ref 96–112)
Creatinine, Ser: 0.95 mg/dL (ref 0.50–1.35)
GFR calc Af Amer: >90 mL/min (ref >90)
GFR calc non Af Amer: 87 mL/min — ABNORMAL LOW (ref >90)
Glucose, Bld: 241 mg/dL — ABNORMAL HIGH (ref 70–99)
Potassium: 4.1 mEq/L (ref 3.5–5.1)
Sodium: 135 mEq/L (ref 135–145)
Total Bilirubin: 0.2 mg/dL — ABNORMAL LOW (ref 0.3–1.2)
Total Protein: 6.7 g/dL (ref 6.0–8.3)

## 2012-01-07 LAB — CBC
HCT: 36.6 % — ABNORMAL LOW (ref 39.0–52.0)
Hemoglobin: 12.9 g/dL — ABNORMAL LOW (ref 13.0–17.0)
MCH: 29.8 pg (ref 26.0–34.0)
MCHC: 35.2 g/dL (ref 30.0–36.0)
MCV: 84.5 fL (ref 78.0–100.0)
Platelets: 132 10*3/uL — ABNORMAL LOW (ref 150–400)
RBC: 4.33 MIL/uL (ref 4.22–5.81)
RDW: 13.5 % (ref 11.5–15.5)
WBC: 5.7 10*3/uL (ref 4.0–10.5)

## 2012-01-07 LAB — PULMONARY FUNCTION TEST

## 2012-01-07 LAB — ABO/RH: ABO/RH(D): O POS

## 2012-01-07 LAB — SURGICAL PCR SCREEN
MRSA, PCR: NEGATIVE
Staphylococcus aureus: NEGATIVE

## 2012-01-07 LAB — PROTIME-INR
INR: 0.98 (ref 0.00–1.49)
Prothrombin Time: 13.2 seconds (ref 11.6–15.2)

## 2012-01-07 LAB — APTT: aPTT: 33 seconds (ref 24–37)

## 2012-01-07 MED ORDER — ALBUTEROL SULFATE (5 MG/ML) 0.5% IN NEBU
2.5000 mg | INHALATION_SOLUTION | Freq: Once | RESPIRATORY_TRACT | Status: AC
  Administered 2012-01-07: 2.5 mg via RESPIRATORY_TRACT

## 2012-01-07 MED ORDER — CHLORHEXIDINE GLUCONATE 4 % EX LIQD: 30.0000 mL | CUTANEOUS | Status: DC

## 2012-01-07 NOTE — Progress Notes (Signed)
Friday.....BRIAN SMALL---REP FOR [name], WAS INFORMED OF SURGERY DATE (9-12) AND TIME (7:30--FIRST CASE)...HE WILL BE HERE BY 0700.   DA

## 2012-01-07 NOTE — Progress Notes (Signed)
1615 Pm   Friday....Marland KitchenPATIENT HAS HAD DOPPLERS & PFT'S DONE TODAY.  EKG & CXR WERE WITHIN CARDIAC ORDER PARAMETERS.    HAVE ALSO CALLED DR. Imagene Gurney OFFICE FOR PATIENTS' RECENT VISIT--?? HAD TIA WITHOUT DEFICITS.. (MAY BE FAXED TO PRE-ADMIT #)   .DA

## 2012-01-08 LAB — HEMOGLOBIN A1C
Hgb A1c MFr Bld: 7.8 % — ABNORMAL HIGH (ref <5.7)
Mean Plasma Glucose: 177 mg/dL — ABNORMAL HIGH (ref <117)

## 2012-01-09 MED ORDER — EPINEPHRINE HCL 1 MG/ML IJ SOLN
0.5000 ug/min | INTRAVENOUS | Status: DC
  Filled 2012-01-09: qty 4

## 2012-01-09 MED ORDER — DEXMEDETOMIDINE HCL IN NACL 400 MCG/100ML IV SOLN
0.1000 ug/kg/h | INTRAVENOUS | Status: DC
  Filled 2012-01-09: qty 100

## 2012-01-09 MED ORDER — SODIUM BICARBONATE 8.4 % IV SOLN
INTRAVENOUS | Status: AC
  Administered 2012-01-10: 10:00:00
  Filled 2012-01-09: qty 2.5

## 2012-01-09 MED ORDER — MOXIFLOXACIN HCL IN NACL 400 MG/250ML IV SOLN
400.0000 mg | INTRAVENOUS | Status: DC
  Filled 2012-01-09: qty 250

## 2012-01-09 MED ORDER — MAGNESIUM SULFATE 50 % IJ SOLN
40.0000 meq | INTRAMUSCULAR | Status: DC
  Filled 2012-01-09: qty 10

## 2012-01-09 MED ORDER — POTASSIUM CHLORIDE 2 MEQ/ML IV SOLN
80.0000 meq | INTRAVENOUS | Status: DC
  Filled 2012-01-09: qty 40

## 2012-01-09 MED ORDER — TRANEXAMIC ACID (OHS) BOLUS VIA INFUSION
15.0000 mg/kg | INTRAVENOUS | Status: DC
  Filled 2012-01-09: qty 1518

## 2012-01-09 MED ORDER — SODIUM CHLORIDE 0.9 % IV SOLN
INTRAVENOUS | Status: DC
  Filled 2012-01-09: qty 1

## 2012-01-09 MED ORDER — NITROGLYCERIN IN D5W 200-5 MCG/ML-% IV SOLN
2.0000 ug/min | INTRAVENOUS | Status: DC
  Filled 2012-01-09: qty 250

## 2012-01-09 MED ORDER — METOPROLOL TARTRATE 12.5 MG HALF TABLET: 12.5000 mg | ORAL_TABLET | Freq: Once | ORAL | Status: DC

## 2012-01-09 MED ORDER — TRANEXAMIC ACID 100 MG/ML IV SOLN
1.5000 mg/kg/h | INTRAVENOUS | Status: DC
  Filled 2012-01-09 (×2): qty 25

## 2012-01-09 MED ORDER — TRANEXAMIC ACID (OHS) PUMP PRIME SOLUTION
2.0000 mg/kg | INTRAVENOUS | Status: DC
  Filled 2012-01-09: qty 2.02

## 2012-01-09 MED ORDER — PHENYLEPHRINE HCL 10 MG/ML IJ SOLN
30.0000 ug/min | INTRAVENOUS | Status: DC
  Filled 2012-01-09: qty 2

## 2012-01-09 MED ORDER — VANCOMYCIN HCL 1000 MG IV SOLR
1250.0000 mg | INTRAVENOUS | Status: DC
  Filled 2012-01-09: qty 1250

## 2012-01-09 MED ORDER — DOPAMINE-DEXTROSE 3.2-5 MG/ML-% IV SOLN
2.0000 ug/kg/min | INTRAVENOUS | Status: DC
  Filled 2012-01-09: qty 250

## 2012-01-10 ENCOUNTER — Inpatient Hospital Stay (HOSPITAL_COMMUNITY): Payer: Medicare Other

## 2012-01-10 ENCOUNTER — Encounter (HOSPITAL_COMMUNITY): Payer: Self-pay | Admitting: Vascular Surgery

## 2012-01-10 ENCOUNTER — Encounter (HOSPITAL_COMMUNITY): Payer: Self-pay | Admitting: Certified Registered"

## 2012-01-10 ENCOUNTER — Encounter (HOSPITAL_COMMUNITY)
Admission: RE | Disposition: A | Discharge status: Home / Self Care | Payer: Self-pay | Source: Ambulatory Visit | Attending: Thoracic Surgery (Cardiothoracic Vascular Surgery)

## 2012-01-10 ENCOUNTER — Inpatient Hospital Stay (HOSPITAL_COMMUNITY): Payer: Medicare Other | Admitting: Certified Registered"

## 2012-01-10 ENCOUNTER — Inpatient Hospital Stay (HOSPITAL_COMMUNITY)
Admission: RE | Admit: 2012-01-10 | Discharge: 2012-01-17 | DRG: 236 | Disposition: A | Discharge status: Home / Self Care | Payer: Medicare Other | Source: Ambulatory Visit | Attending: Thoracic Surgery (Cardiothoracic Vascular Surgery) | Admitting: Thoracic Surgery (Cardiothoracic Vascular Surgery)

## 2012-01-10 DIAGNOSIS — Z9861 Coronary angioplasty status: Secondary | ICD-10-CM

## 2012-01-10 DIAGNOSIS — I200 Unstable angina: Secondary | ICD-10-CM | POA: Diagnosis not present

## 2012-01-10 DIAGNOSIS — Z09 Encounter for follow-up examination after completed treatment for conditions other than malignant neoplasm: Secondary | ICD-10-CM | POA: Diagnosis not present

## 2012-01-10 DIAGNOSIS — I10 Essential (primary) hypertension: Secondary | ICD-10-CM | POA: Diagnosis not present

## 2012-01-10 DIAGNOSIS — Z95810 Presence of automatic (implantable) cardiac defibrillator: Secondary | ICD-10-CM | POA: Diagnosis not present

## 2012-01-10 DIAGNOSIS — Z8673 Personal history of transient ischemic attack (TIA), and cerebral infarction without residual deficits: Secondary | ICD-10-CM

## 2012-01-10 DIAGNOSIS — F05 Delirium due to known physiological condition: Secondary | ICD-10-CM | POA: Diagnosis not present

## 2012-01-10 DIAGNOSIS — Z951 Presence of aortocoronary bypass graft: Secondary | ICD-10-CM

## 2012-01-10 DIAGNOSIS — N401 Enlarged prostate with lower urinary tract symptoms: Secondary | ICD-10-CM | POA: Diagnosis not present

## 2012-01-10 DIAGNOSIS — Z87891 Personal history of nicotine dependence: Secondary | ICD-10-CM | POA: Diagnosis not present

## 2012-01-10 DIAGNOSIS — Z01811 Encounter for preprocedural respiratory examination: Secondary | ICD-10-CM | POA: Diagnosis not present

## 2012-01-10 DIAGNOSIS — Z7982 Long term (current) use of aspirin: Secondary | ICD-10-CM | POA: Diagnosis not present

## 2012-01-10 DIAGNOSIS — E785 Hyperlipidemia, unspecified: Secondary | ICD-10-CM | POA: Diagnosis present

## 2012-01-10 DIAGNOSIS — D62 Acute posthemorrhagic anemia: Secondary | ICD-10-CM | POA: Diagnosis not present

## 2012-01-10 DIAGNOSIS — D696 Thrombocytopenia, unspecified: Secondary | ICD-10-CM | POA: Diagnosis present

## 2012-01-10 DIAGNOSIS — Z9889 Other specified postprocedural states: Secondary | ICD-10-CM | POA: Diagnosis not present

## 2012-01-10 DIAGNOSIS — Z79899 Other long term (current) drug therapy: Secondary | ICD-10-CM

## 2012-01-10 DIAGNOSIS — Z96659 Presence of unspecified artificial knee joint: Secondary | ICD-10-CM | POA: Diagnosis not present

## 2012-01-10 DIAGNOSIS — I2510 Atherosclerotic heart disease of native coronary artery without angina pectoris: Principal | ICD-10-CM | POA: Diagnosis present

## 2012-01-10 DIAGNOSIS — E119 Type 2 diabetes mellitus without complications: Secondary | ICD-10-CM | POA: Diagnosis not present

## 2012-01-10 DIAGNOSIS — K219 Gastro-esophageal reflux disease without esophagitis: Secondary | ICD-10-CM | POA: Diagnosis present

## 2012-01-10 DIAGNOSIS — Z23 Encounter for immunization: Secondary | ICD-10-CM

## 2012-01-10 DIAGNOSIS — Z794 Long term (current) use of insulin: Secondary | ICD-10-CM | POA: Diagnosis not present

## 2012-01-10 DIAGNOSIS — F341 Dysthymic disorder: Secondary | ICD-10-CM | POA: Diagnosis present

## 2012-01-10 DIAGNOSIS — R918 Other nonspecific abnormal finding of lung field: Secondary | ICD-10-CM | POA: Diagnosis not present

## 2012-01-10 DIAGNOSIS — Z8674 Personal history of sudden cardiac arrest: Secondary | ICD-10-CM

## 2012-01-10 DIAGNOSIS — J209 Acute bronchitis, unspecified: Secondary | ICD-10-CM | POA: Diagnosis not present

## 2012-01-10 DIAGNOSIS — Z981 Arthrodesis status: Secondary | ICD-10-CM | POA: Diagnosis not present

## 2012-01-10 DIAGNOSIS — J90 Pleural effusion, not elsewhere classified: Secondary | ICD-10-CM | POA: Diagnosis not present

## 2012-01-10 DIAGNOSIS — J9819 Other pulmonary collapse: Secondary | ICD-10-CM | POA: Diagnosis not present

## 2012-01-10 HISTORY — DX: Personal history of transient ischemic attack (TIA), and cerebral infarction without residual deficits: Z86.73

## 2012-01-10 HISTORY — DX: Presence of cardiac pacemaker: Z95.0

## 2012-01-10 HISTORY — PX: CORONARY ARTERY BYPASS GRAFT: SHX141

## 2012-01-10 LAB — CBC
HCT: 33.1 % — ABNORMAL LOW (ref 39.0–52.0)
HCT: 32.2 % — ABNORMAL LOW (ref 39.0–52.0)
Hemoglobin: 11.7 g/dL — ABNORMAL LOW (ref 13.0–17.0)
Hemoglobin: 11.3 g/dL — ABNORMAL LOW (ref 13.0–17.0)
MCH: 30.1 pg (ref 26.0–34.0)
MCH: 30 pg (ref 26.0–34.0)
MCHC: 35.3 g/dL (ref 30.0–36.0)
MCHC: 35.1 g/dL (ref 30.0–36.0)
MCV: 85.1 fL (ref 78.0–100.0)
MCV: 85.4 fL (ref 78.0–100.0)
Platelets: 101 10*3/uL — ABNORMAL LOW (ref 150–400)
Platelets: 107 10*3/uL — ABNORMAL LOW (ref 150–400)
RBC: 3.89 MIL/uL — ABNORMAL LOW (ref 4.22–5.81)
RBC: 3.77 MIL/uL — ABNORMAL LOW (ref 4.22–5.81)
RDW: 13.7 % (ref 11.5–15.5)
RDW: 13.7 % (ref 11.5–15.5)
WBC: 8.7 10*3/uL (ref 4.0–10.5)
WBC: 8.1 10*3/uL (ref 4.0–10.5)

## 2012-01-10 LAB — POCT I-STAT 3, VENOUS BLOOD GAS (G3P V)
Acid-Base Excess: 1 mmol/L (ref 0.0–2.0)
Acid-Base Excess: 4 mmol/L — ABNORMAL HIGH (ref 0.0–2.0)
Bicarbonate: 26 mEq/L — ABNORMAL HIGH (ref 20.0–24.0)
Bicarbonate: 28.4 mEq/L — ABNORMAL HIGH (ref 20.0–24.0)
O2 Saturation: 79 %
O2 Saturation: 100 %
Patient temperature: 32.5
Patient temperature: 32.5
TCO2: 27 mmol/L (ref 0–100)
TCO2: 30 mmol/L (ref 0–100)
pCO2, Ven: 35.9 mmHg — ABNORMAL LOW (ref 45.0–50.0)
pCO2, Ven: 33.7 mmHg — ABNORMAL LOW (ref 45.0–50.0)
pH, Ven: 7.448 — ABNORMAL HIGH (ref 7.250–7.300)
pH, Ven: 7.516 — ABNORMAL HIGH (ref 7.250–7.300)
pO2, Ven: 32 mmHg (ref 30.0–45.0)
pO2, Ven: 306 mmHg — ABNORMAL HIGH (ref 30.0–45.0)

## 2012-01-10 LAB — MAGNESIUM: Magnesium: 2.7 mg/dL — ABNORMAL HIGH (ref 1.5–2.5)

## 2012-01-10 LAB — POCT I-STAT 3, ART BLOOD GAS (G3+)
Acid-Base Excess: 1 mmol/L (ref 0.0–2.0)
Acid-base deficit: 2 mmol/L (ref 0.0–2.0)
Bicarbonate: 25.4 mEq/L — ABNORMAL HIGH (ref 20.0–24.0)
Bicarbonate: 25 mEq/L — ABNORMAL HIGH (ref 20.0–24.0)
Bicarbonate: 24.9 mEq/L — ABNORMAL HIGH (ref 20.0–24.0)
Bicarbonate: 26.9 mEq/L — ABNORMAL HIGH (ref 20.0–24.0)
O2 Saturation: 100 %
O2 Saturation: 99 %
O2 Saturation: 98 %
O2 Saturation: 98 %
Patient temperature: 37.1
Patient temperature: 36.2
Patient temperature: 37.9
Patient temperature: 37.9
TCO2: 27 mmol/L (ref 0–100)
TCO2: 26 mmol/L (ref 0–100)
TCO2: 26 mmol/L (ref 0–100)
TCO2: 28 mmol/L (ref 0–100)
pCO2 arterial: 42.1 mmHg (ref 35.0–45.0)
pCO2 arterial: 39 mmHg (ref 35.0–45.0)
pCO2 arterial: 54.4 mmHg — ABNORMAL HIGH (ref 35.0–45.0)
pCO2 arterial: 51.4 mmHg — ABNORMAL HIGH (ref 35.0–45.0)
pH, Arterial: 7.389 (ref 7.350–7.450)
pH, Arterial: 7.413 (ref 7.350–7.450)
pH, Arterial: 7.273 — ABNORMAL LOW (ref 7.350–7.450)
pH, Arterial: 7.33 — ABNORMAL LOW (ref 7.350–7.450)
pO2, Arterial: 260 mmHg — ABNORMAL HIGH (ref 80.0–100.0)
pO2, Arterial: 119 mmHg — ABNORMAL HIGH (ref 80.0–100.0)
pO2, Arterial: 119 mmHg — ABNORMAL HIGH (ref 80.0–100.0)
pO2, Arterial: 126 mmHg — ABNORMAL HIGH (ref 80.0–100.0)

## 2012-01-10 LAB — POCT I-STAT, CHEM 8
BUN: 14 mg/dL (ref 6–23)
Calcium, Ion: 1.34 mmol/L — ABNORMAL HIGH (ref 1.13–1.30)
Chloride: 107 mEq/L (ref 96–112)
Creatinine, Ser: 1.1 mg/dL (ref 0.50–1.35)
Glucose, Bld: 121 mg/dL — ABNORMAL HIGH (ref 70–99)
HCT: 31 % — ABNORMAL LOW (ref 39.0–52.0)
Hemoglobin: 10.5 g/dL — ABNORMAL LOW (ref 13.0–17.0)
Potassium: 4.5 mEq/L (ref 3.5–5.1)
Sodium: 139 mEq/L (ref 135–145)
TCO2: 24 mmol/L (ref 0–100)

## 2012-01-10 LAB — POCT I-STAT 4, (NA,K, GLUC, HGB,HCT)
Glucose, Bld: 100 mg/dL — ABNORMAL HIGH (ref 70–99)
Glucose, Bld: 105 mg/dL — ABNORMAL HIGH (ref 70–99)
Glucose, Bld: 101 mg/dL — ABNORMAL HIGH (ref 70–99)
Glucose, Bld: 107 mg/dL — ABNORMAL HIGH (ref 70–99)
Glucose, Bld: 135 mg/dL — ABNORMAL HIGH (ref 70–99)
Glucose, Bld: 132 mg/dL — ABNORMAL HIGH (ref 70–99)
HCT: 34 % — ABNORMAL LOW (ref 39.0–52.0)
HCT: 31 % — ABNORMAL LOW (ref 39.0–52.0)
HCT: 22 % — ABNORMAL LOW (ref 39.0–52.0)
HCT: 25 % — ABNORMAL LOW (ref 39.0–52.0)
HCT: 32 % — ABNORMAL LOW (ref 39.0–52.0)
HCT: 33 % — ABNORMAL LOW (ref 39.0–52.0)
Hemoglobin: 11.6 g/dL — ABNORMAL LOW (ref 13.0–17.0)
Hemoglobin: 10.5 g/dL — ABNORMAL LOW (ref 13.0–17.0)
Hemoglobin: 7.5 g/dL — ABNORMAL LOW (ref 13.0–17.0)
Hemoglobin: 8.5 g/dL — ABNORMAL LOW (ref 13.0–17.0)
Hemoglobin: 10.9 g/dL — ABNORMAL LOW (ref 13.0–17.0)
Hemoglobin: 11.2 g/dL — ABNORMAL LOW (ref 13.0–17.0)
Potassium: 3.9 mEq/L (ref 3.5–5.1)
Potassium: 4.1 mEq/L (ref 3.5–5.1)
Potassium: 4.9 mEq/L (ref 3.5–5.1)
Potassium: 6.5 mEq/L (ref 3.5–5.1)
Potassium: 5.1 mEq/L (ref 3.5–5.1)
Potassium: 4.3 mEq/L (ref 3.5–5.1)
Sodium: 138 mEq/L (ref 135–145)
Sodium: 139 mEq/L (ref 135–145)
Sodium: 133 mEq/L — ABNORMAL LOW (ref 135–145)
Sodium: 136 mEq/L (ref 135–145)
Sodium: 136 mEq/L (ref 135–145)
Sodium: 140 mEq/L (ref 135–145)

## 2012-01-10 LAB — HEMOGLOBIN AND HEMATOCRIT, BLOOD
HCT: 22.4 % — ABNORMAL LOW (ref 39.0–52.0)
Hemoglobin: 7.9 g/dL — ABNORMAL LOW (ref 13.0–17.0)

## 2012-01-10 LAB — GLUCOSE, CAPILLARY
Glucose-Capillary: 101 mg/dL — ABNORMAL HIGH (ref 70–99)
Glucose-Capillary: 105 mg/dL — ABNORMAL HIGH (ref 70–99)
Glucose-Capillary: 119 mg/dL — ABNORMAL HIGH (ref 70–99)
Glucose-Capillary: 77 mg/dL (ref 70–99)
Glucose-Capillary: 95 mg/dL (ref 70–99)
Glucose-Capillary: 85 mg/dL (ref 70–99)
Glucose-Capillary: 113 mg/dL — ABNORMAL HIGH (ref 70–99)

## 2012-01-10 LAB — POCT I-STAT GLUCOSE
Glucose, Bld: 110 mg/dL — ABNORMAL HIGH (ref 70–99)
Operator id: 3390

## 2012-01-10 LAB — PROTIME-INR
INR: 1.39 (ref 0.00–1.49)
Prothrombin Time: 17.3 seconds — ABNORMAL HIGH (ref 11.6–15.2)

## 2012-01-10 LAB — CREATININE, SERUM
Creatinine, Ser: 1.1 mg/dL (ref 0.50–1.35)
GFR calc Af Amer: 81 mL/min — ABNORMAL LOW (ref >90)
GFR calc non Af Amer: 70 mL/min — ABNORMAL LOW (ref >90)

## 2012-01-10 LAB — APTT: aPTT: 36 seconds (ref 24–37)

## 2012-01-10 LAB — PLATELET COUNT: Platelets: 73 10*3/uL — ABNORMAL LOW (ref 150–400)

## 2012-01-10 SURGERY — CORONARY ARTERY BYPASS GRAFTING (CABG)
Anesthesia: General | Site: Chest | Wound class: Clean

## 2012-01-10 MED ORDER — ASPIRIN EC 325 MG PO TBEC
325.0000 mg | DELAYED_RELEASE_TABLET | Freq: Every day | ORAL | Status: DC
  Administered 2012-01-11: 325 mg via ORAL
  Filled 2012-01-10: qty 1

## 2012-01-10 MED ORDER — DOCUSATE SODIUM 100 MG PO CAPS
200.0000 mg | ORAL_CAPSULE | Freq: Every day | ORAL | Status: DC
  Administered 2012-01-11 – 2012-01-17 (×7): 200 mg via ORAL
  Filled 2012-01-10 (×7): qty 2

## 2012-01-10 MED ORDER — PANTOPRAZOLE SODIUM 40 MG PO TBEC
40.0000 mg | DELAYED_RELEASE_TABLET | Freq: Every day | ORAL | Status: DC
  Administered 2012-01-12 – 2012-01-17 (×4): 40 mg via ORAL
  Filled 2012-01-10 (×5): qty 1

## 2012-01-10 MED ORDER — PROTAMINE SULFATE 10 MG/ML IV SOLN
INTRAVENOUS | Status: DC | PRN
  Administered 2012-01-10 (×2): 50 mg via INTRAVENOUS
  Administered 2012-01-10: 10 mg via INTRAVENOUS
  Administered 2012-01-10: 30 mg via INTRAVENOUS
  Administered 2012-01-10: 50 mg via INTRAVENOUS
  Administered 2012-01-10: 10 mg via INTRAVENOUS
  Administered 2012-01-10: 40 mg via INTRAVENOUS
  Administered 2012-01-10 (×2): 50 mg via INTRAVENOUS

## 2012-01-10 MED ORDER — SODIUM CHLORIDE 0.9 % IV SOLN: 250.0000 mL | INTRAVENOUS | Status: DC

## 2012-01-10 MED ORDER — LACTATED RINGERS IV SOLN: 500.0000 mL | Freq: Once | INTRAVENOUS | Status: AC | PRN

## 2012-01-10 MED ORDER — SODIUM CHLORIDE 0.9 % IV SOLN
INTRAVENOUS | Status: DC
  Administered 2012-01-10: 0.7 [IU]/h via INTRAVENOUS
  Filled 2012-01-10: qty 1

## 2012-01-10 MED ORDER — CALCIUM CHLORIDE 10 % IV SOLN
1.0000 g | Freq: Once | INTRAVENOUS | Status: AC
  Administered 2012-01-10: 1 g via INTRAVENOUS

## 2012-01-10 MED ORDER — SODIUM CHLORIDE 0.9 % IV SOLN
INTRAVENOUS | Status: DC | PRN
  Administered 2012-01-10 (×2): via INTRAVENOUS

## 2012-01-10 MED ORDER — ASPIRIN 81 MG PO CHEW: 324.0000 mg | CHEWABLE_TABLET | Freq: Every day | ORAL | Status: DC

## 2012-01-10 MED ORDER — VANCOMYCIN HCL 1000 MG IV SOLR
1250.0000 mg | INTRAVENOUS | Status: DC | PRN
  Administered 2012-01-10: 1250 mg via INTRAVENOUS

## 2012-01-10 MED ORDER — BISACODYL 5 MG PO TBEC
10.0000 mg | DELAYED_RELEASE_TABLET | Freq: Every day | ORAL | Status: DC
  Administered 2012-01-11 – 2012-01-17 (×5): 10 mg via ORAL
  Filled 2012-01-10 (×4): qty 2

## 2012-01-10 MED ORDER — DEXMEDETOMIDINE HCL IN NACL 200 MCG/50ML IV SOLN: 0.1000 ug/kg/h | INTRAVENOUS | Status: DC

## 2012-01-10 MED ORDER — TRANEXAMIC ACID 100 MG/ML IV SOLN
2500.0000 mg | INTRAVENOUS | Status: DC | PRN
  Administered 2012-01-10: 1.5 mg/kg/h via INTRAVENOUS

## 2012-01-10 MED ORDER — GABAPENTIN 300 MG PO CAPS
600.0000 mg | ORAL_CAPSULE | Freq: Two times a day (BID) | ORAL | Status: DC
  Administered 2012-01-10 – 2012-01-17 (×14): 600 mg via ORAL
  Filled 2012-01-10 (×15): qty 2

## 2012-01-10 MED ORDER — METOPROLOL TARTRATE 25 MG/10 ML ORAL SUSPENSION
12.5000 mg | Freq: Two times a day (BID) | ORAL | Status: DC
  Filled 2012-01-10 (×3): qty 5

## 2012-01-10 MED ORDER — POTASSIUM CHLORIDE 10 MEQ/50ML IV SOLN: 10.0000 meq | INTRAVENOUS | Status: AC

## 2012-01-10 MED ORDER — ACETAMINOPHEN 650 MG RE SUPP
650.0000 mg | RECTAL | Status: AC
  Administered 2012-01-10: 650 mg via RECTAL

## 2012-01-10 MED ORDER — FAMOTIDINE IN NACL 20-0.9 MG/50ML-% IV SOLN
20.0000 mg | Freq: Two times a day (BID) | INTRAVENOUS | Status: DC
  Administered 2012-01-10: 20 mg via INTRAVENOUS

## 2012-01-10 MED ORDER — ALBUMIN HUMAN 5 % IV SOLN
INTRAVENOUS | Status: DC | PRN
  Administered 2012-01-10: 12:00:00 via INTRAVENOUS

## 2012-01-10 MED ORDER — SODIUM CHLORIDE 0.45 % IV SOLN
INTRAVENOUS | Status: DC
  Administered 2012-01-10: 20 mL via INTRAVENOUS

## 2012-01-10 MED ORDER — MORPHINE SULFATE 2 MG/ML IJ SOLN
2.0000 mg | INTRAMUSCULAR | Status: DC | PRN
  Administered 2012-01-11: 4 mg via INTRAVENOUS
  Administered 2012-01-11: 2 mg via INTRAVENOUS
  Filled 2012-01-10 (×3): qty 1
  Filled 2012-01-10: qty 2

## 2012-01-10 MED ORDER — BISACODYL 10 MG RE SUPP: 10.0000 mg | Freq: Every day | RECTAL | Status: DC

## 2012-01-10 MED ORDER — ALBUMIN HUMAN 5 % IV SOLN
250.0000 mL | INTRAVENOUS | Status: AC | PRN
  Administered 2012-01-10 (×2): 250 mL via INTRAVENOUS
  Filled 2012-01-10: qty 250

## 2012-01-10 MED ORDER — ONDANSETRON HCL 4 MG/2ML IJ SOLN: 4.0000 mg | Freq: Four times a day (QID) | INTRAMUSCULAR | Status: DC | PRN

## 2012-01-10 MED ORDER — MIDAZOLAM HCL 2 MG/2ML IJ SOLN: 2.0000 mg | INTRAMUSCULAR | Status: DC | PRN

## 2012-01-10 MED ORDER — LACTATED RINGERS IV SOLN
INTRAVENOUS | Status: DC | PRN
  Administered 2012-01-10 (×3): via INTRAVENOUS

## 2012-01-10 MED ORDER — HEPARIN SODIUM (PORCINE) 1000 UNIT/ML IJ SOLN
INTRAMUSCULAR | Status: DC | PRN
  Administered 2012-01-10: 29000 [IU] via INTRAVENOUS
  Administered 2012-01-10: 2000 [IU] via INTRAVENOUS

## 2012-01-10 MED ORDER — INSULIN REGULAR BOLUS VIA INFUSION
0.0000 [IU] | Freq: Three times a day (TID) | INTRAVENOUS | Status: DC
  Filled 2012-01-10: qty 10

## 2012-01-10 MED ORDER — SODIUM CHLORIDE 0.9 % IV SOLN
200.0000 ug | INTRAVENOUS | Status: DC | PRN
  Administered 2012-01-10: 0.3 ug/kg/h via INTRAVENOUS

## 2012-01-10 MED ORDER — CITALOPRAM HYDROBROMIDE 20 MG PO TABS
20.0000 mg | ORAL_TABLET | Freq: Every day | ORAL | Status: DC
  Administered 2012-01-11 – 2012-01-17 (×7): 20 mg via ORAL
  Filled 2012-01-10 (×7): qty 1

## 2012-01-10 MED ORDER — SODIUM CHLORIDE 0.9 % IJ SOLN
OROMUCOSAL | Status: DC | PRN
  Administered 2012-01-10 (×3): via TOPICAL

## 2012-01-10 MED ORDER — SIMVASTATIN 20 MG PO TABS
20.0000 mg | ORAL_TABLET | Freq: Every day | ORAL | Status: DC
  Administered 2012-01-11 – 2012-01-16 (×6): 20 mg via ORAL
  Filled 2012-01-10 (×8): qty 1

## 2012-01-10 MED ORDER — FENTANYL CITRATE 0.05 MG/ML IJ SOLN
INTRAMUSCULAR | Status: DC | PRN
  Administered 2012-01-10: 50 ug via INTRAVENOUS
  Administered 2012-01-10: 100 ug via INTRAVENOUS
  Administered 2012-01-10 (×2): 50 ug via INTRAVENOUS
  Administered 2012-01-10: 150 ug via INTRAVENOUS
  Administered 2012-01-10: 1500 ug via INTRAVENOUS
  Administered 2012-01-10: 50 ug via INTRAVENOUS

## 2012-01-10 MED ORDER — 0.9 % SODIUM CHLORIDE (POUR BTL) OPTIME
TOPICAL | Status: DC | PRN
  Administered 2012-01-10: 6000 mL

## 2012-01-10 MED ORDER — PHENYLEPHRINE HCL 10 MG/ML IJ SOLN
20.0000 mg | INTRAVENOUS | Status: DC | PRN
  Administered 2012-01-10: 10 ug/min via INTRAVENOUS

## 2012-01-10 MED ORDER — OXYCODONE HCL 5 MG PO TABS
5.0000 mg | ORAL_TABLET | ORAL | Status: DC | PRN
  Administered 2012-01-11 – 2012-01-12 (×3): 10 mg via ORAL
  Filled 2012-01-10 (×3): qty 2

## 2012-01-10 MED ORDER — HEMOSTATIC AGENTS (NO CHARGE) OPTIME
TOPICAL | Status: DC | PRN
  Administered 2012-01-10: 1 via TOPICAL

## 2012-01-10 MED ORDER — NITROGLYCERIN IN D5W 200-5 MCG/ML-% IV SOLN
INTRAVENOUS | Status: DC | PRN
  Administered 2012-01-10: 5 ug/min via INTRAVENOUS

## 2012-01-10 MED ORDER — ACETAMINOPHEN 160 MG/5ML PO SOLN: 975.0000 mg | Freq: Four times a day (QID) | ORAL | Status: DC

## 2012-01-10 MED ORDER — PHENYLEPHRINE HCL 10 MG/ML IJ SOLN
0.0000 ug/min | INTRAVENOUS | Status: DC
  Administered 2012-01-10: 10 ug/min via INTRAVENOUS
  Filled 2012-01-10: qty 2

## 2012-01-10 MED ORDER — MORPHINE SULFATE 2 MG/ML IJ SOLN
1.0000 mg | INTRAMUSCULAR | Status: AC | PRN
  Administered 2012-01-10 – 2012-01-11 (×2): 2 mg via INTRAVENOUS

## 2012-01-10 MED ORDER — SODIUM CHLORIDE 0.9 % IJ SOLN: 3.0000 mL | INTRAMUSCULAR | Status: DC | PRN

## 2012-01-10 MED ORDER — MOXIFLOXACIN HCL IN NACL 400 MG/250ML IV SOLN
400.0000 mg | Freq: Once | INTRAVENOUS | Status: AC
  Administered 2012-01-11: 400 mg via INTRAVENOUS
  Filled 2012-01-10: qty 250

## 2012-01-10 MED ORDER — SODIUM CHLORIDE 0.9 % IV SOLN
100.0000 [IU] | INTRAVENOUS | Status: DC | PRN
  Administered 2012-01-10: 1 [IU]/h via INTRAVENOUS

## 2012-01-10 MED ORDER — MIDAZOLAM HCL 5 MG/5ML IJ SOLN
INTRAMUSCULAR | Status: DC | PRN
  Administered 2012-01-10 (×2): 3 mg via INTRAVENOUS
  Administered 2012-01-10 (×2): 2 mg via INTRAVENOUS

## 2012-01-10 MED ORDER — MAGNESIUM SULFATE 40 MG/ML IJ SOLN
4.0000 g | Freq: Once | INTRAMUSCULAR | Status: AC
  Administered 2012-01-10: 4 g via INTRAVENOUS
  Filled 2012-01-10: qty 100

## 2012-01-10 MED ORDER — METOPROLOL TARTRATE 12.5 MG HALF TABLET
12.5000 mg | ORAL_TABLET | Freq: Two times a day (BID) | ORAL | Status: DC
  Filled 2012-01-10 (×3): qty 1

## 2012-01-10 MED ORDER — NITROGLYCERIN IN D5W 200-5 MCG/ML-% IV SOLN: 0.0000 ug/min | INTRAVENOUS | Status: DC

## 2012-01-10 MED ORDER — SODIUM CHLORIDE 0.9 % IV SOLN
INTRAVENOUS | Status: DC
  Administered 2012-01-10: 20 mL via INTRAVENOUS
  Administered 2012-01-12: 20 mL/h via INTRAVENOUS

## 2012-01-10 MED ORDER — ROCURONIUM BROMIDE 100 MG/10ML IV SOLN
INTRAVENOUS | Status: DC | PRN
  Administered 2012-01-10: 30 mg via INTRAVENOUS
  Administered 2012-01-10: 20 mg via INTRAVENOUS
  Administered 2012-01-10: 50 mg via INTRAVENOUS
  Administered 2012-01-10: 20 mg via INTRAVENOUS
  Administered 2012-01-10: 30 mg via INTRAVENOUS
  Administered 2012-01-10: 50 mg via INTRAVENOUS

## 2012-01-10 MED ORDER — DEXMEDETOMIDINE HCL IN NACL 400 MCG/100ML IV SOLN
0.4000 ug/kg/h | INTRAVENOUS | Status: DC
  Filled 2012-01-10 (×2): qty 100

## 2012-01-10 MED ORDER — PROPOFOL 10 MG/ML IV EMUL
INTRAVENOUS | Status: DC | PRN
  Administered 2012-01-10: 120 mg via INTRAVENOUS

## 2012-01-10 MED ORDER — VANCOMYCIN HCL IN DEXTROSE 1-5 GM/200ML-% IV SOLN
1000.0000 mg | Freq: Once | INTRAVENOUS | Status: AC
  Administered 2012-01-10: 1000 mg via INTRAVENOUS
  Filled 2012-01-10: qty 200

## 2012-01-10 MED ORDER — LACTATED RINGERS IV SOLN
INTRAVENOUS | Status: DC
  Administered 2012-01-10: 60 mL via INTRAVENOUS

## 2012-01-10 MED ORDER — MOXIFLOXACIN HCL IN NACL 400 MG/250ML IV SOLN
INTRAVENOUS | Status: DC | PRN
  Administered 2012-01-10: 400 mg via INTRAVENOUS

## 2012-01-10 MED ORDER — ACETAMINOPHEN 500 MG PO TABS
1000.0000 mg | ORAL_TABLET | Freq: Four times a day (QID) | ORAL | Status: AC
  Administered 2012-01-11 – 2012-01-15 (×13): 1000 mg via ORAL
  Filled 2012-01-10 (×19): qty 2

## 2012-01-10 MED ORDER — METOPROLOL TARTRATE 1 MG/ML IV SOLN
2.5000 mg | INTRAVENOUS | Status: DC | PRN
  Administered 2012-01-12: 2.5 mg via INTRAVENOUS
  Administered 2012-01-13: 5 mg via INTRAVENOUS

## 2012-01-10 MED ORDER — ACETAMINOPHEN 160 MG/5ML PO SOLN: 650.0000 mg | ORAL | Status: AC

## 2012-01-10 MED ORDER — TRANEXAMIC ACID 100 MG/ML IV SOLN
2500.0000 mg | INTRAVENOUS | Status: DC | PRN
  Administered 2012-01-10: 15 mg/kg/h via INTRAVENOUS

## 2012-01-10 MED ORDER — SODIUM CHLORIDE 0.9 % IJ SOLN
3.0000 mL | Freq: Two times a day (BID) | INTRAMUSCULAR | Status: DC
  Administered 2012-01-11 – 2012-01-13 (×2): 3 mL via INTRAVENOUS

## 2012-01-10 MED ORDER — INSULIN ASPART 100 UNIT/ML ~~LOC~~ SOLN
0.0000 [IU] | SUBCUTANEOUS | Status: DC
  Administered 2012-01-11 (×2): 4 [IU] via SUBCUTANEOUS

## 2012-01-10 MED FILL — Potassium Chloride Inj 2 mEq/ML: INTRAVENOUS | Qty: 40 | Status: AC

## 2012-01-10 MED FILL — Magnesium Sulfate Inj 50%: INTRAMUSCULAR | Qty: 2 | Status: AC

## 2012-01-10 SURGICAL SUPPLY — 92 items
ATTRACTOMAT 16X20 MAGNETIC DRP (DRAPES) ×2 IMPLANT
BAG DECANTER FOR FLEXI CONT (MISCELLANEOUS) ×2 IMPLANT
BANDAGE ELASTIC 4 VELCRO ST LF (GAUZE/BANDAGES/DRESSINGS) ×2 IMPLANT
BANDAGE ELASTIC 6 VELCRO ST LF (GAUZE/BANDAGES/DRESSINGS) ×2 IMPLANT
BANDAGE GAUZE ELAST BULKY 4 IN (GAUZE/BANDAGES/DRESSINGS) ×2 IMPLANT
BASKET HEART (ORDER IN 25'S) (MISCELLANEOUS) ×1
BASKET HEART (ORDER IN 25S) (MISCELLANEOUS) ×1 IMPLANT
BLADE STERNUM SYSTEM 6 (BLADE) ×2 IMPLANT
BLADE SURG 11 STRL SS (BLADE) ×1 IMPLANT
BLADE SURG ROTATE 9660 (MISCELLANEOUS) ×1 IMPLANT
CANISTER SUCTION 2500CC (MISCELLANEOUS) ×2 IMPLANT
CATH CPB KIT HENDRICKSON (MISCELLANEOUS) ×2 IMPLANT
CATH ROBINSON RED A/P 18FR (CATHETERS) ×2 IMPLANT
CATH THORACIC 36FR (CATHETERS) ×2 IMPLANT
CATH THORACIC 36FR RT ANG (CATHETERS) ×2 IMPLANT
CLIP FOGARTY SPRING 6M (CLIP) ×1 IMPLANT
CLIP TI MEDIUM 24 (CLIP) IMPLANT
CLIP TI WIDE RED SMALL 24 (CLIP) ×3 IMPLANT
CLOTH BEACON ORANGE TIMEOUT ST (SAFETY) ×2 IMPLANT
COVER SURGICAL LIGHT HANDLE (MISCELLANEOUS) ×2 IMPLANT
CRADLE DONUT ADULT HEAD (MISCELLANEOUS) ×2 IMPLANT
DRAPE CARDIOVASCULAR INCISE (DRAPES) ×2
DRAPE SLUSH/WARMER DISC (DRAPES) ×2 IMPLANT
DRAPE SRG 135X102X78XABS (DRAPES) ×1 IMPLANT
DRSG COVADERM 4X14 (GAUZE/BANDAGES/DRESSINGS) ×2 IMPLANT
ELECT REM PT RETURN 9FT ADLT (ELECTROSURGICAL) ×4
ELECTRODE REM PT RTRN 9FT ADLT (ELECTROSURGICAL) ×2 IMPLANT
GLOVE BIO SURGEON STRL SZ 6 (GLOVE) ×2 IMPLANT
GLOVE BIO SURGEON STRL SZ 6.5 (GLOVE) ×3 IMPLANT
GLOVE BIO SURGEON STRL SZ7 (GLOVE) ×1 IMPLANT
GLOVE BIO SURGEON STRL SZ7.5 (GLOVE) ×1 IMPLANT
GLOVE BIOGEL PI IND STRL 6.5 (GLOVE) IMPLANT
GLOVE BIOGEL PI IND STRL 7.0 (GLOVE) IMPLANT
GLOVE BIOGEL PI IND STRL 7.5 (GLOVE) IMPLANT
GLOVE BIOGEL PI INDICATOR 6.5 (GLOVE) ×1
GLOVE BIOGEL PI INDICATOR 7.0 (GLOVE) ×4
GLOVE BIOGEL PI INDICATOR 7.5 (GLOVE)
GLOVE EUDERMIC 7 POWDERFREE (GLOVE) ×6 IMPLANT
GOWN PREVENTION PLUS XLARGE (GOWN DISPOSABLE) ×4 IMPLANT
GOWN STRL NON-REIN LRG LVL3 (GOWN DISPOSABLE) ×10 IMPLANT
HEMOSTAT POWDER SURGIFOAM 1G (HEMOSTASIS) ×6 IMPLANT
HEMOSTAT SURGICEL 2X14 (HEMOSTASIS) ×2 IMPLANT
INSERT FOGARTY XLG (MISCELLANEOUS) IMPLANT
KIT BASIN OR (CUSTOM PROCEDURE TRAY) ×2 IMPLANT
KIT ROOM TURNOVER OR (KITS) ×2 IMPLANT
KIT SUCTION CATH 14FR (SUCTIONS) ×4 IMPLANT
KIT VASOVIEW W/TROCAR VH 2000 (KITS) ×2 IMPLANT
MARKER GRAFT CORONARY BYPASS (MISCELLANEOUS) ×6 IMPLANT
NS IRRIG 1000ML POUR BTL (IV SOLUTION) ×11 IMPLANT
PACK OPEN HEART (CUSTOM PROCEDURE TRAY) ×2 IMPLANT
PAD ARMBOARD 7.5X6 YLW CONV (MISCELLANEOUS) ×4 IMPLANT
PENCIL BUTTON HOLSTER BLD 10FT (ELECTRODE) ×2 IMPLANT
PUNCH AORTIC ROTATE 4.0MM (MISCELLANEOUS) IMPLANT
PUNCH AORTIC ROTATE 4.5MM 8IN (MISCELLANEOUS) ×1 IMPLANT
PUNCH AORTIC ROTATE 5MM 8IN (MISCELLANEOUS) IMPLANT
SET CARDIOPLEGIA MPS 5001102 (MISCELLANEOUS) ×1 IMPLANT
SPONGE GAUZE 4X4 12PLY (GAUZE/BANDAGES/DRESSINGS) ×4 IMPLANT
SPONGE LAP 4X18 X RAY DECT (DISPOSABLE) ×1 IMPLANT
SUT BONE WAX W31G (SUTURE) ×2 IMPLANT
SUT MNCRL AB 4-0 PS2 18 (SUTURE) ×1 IMPLANT
SUT PROLENE 3 0 SH DA (SUTURE) ×2 IMPLANT
SUT PROLENE 4 0 RB 1 (SUTURE) ×2
SUT PROLENE 4 0 SH DA (SUTURE) IMPLANT
SUT PROLENE 4-0 RB1 .5 CRCL 36 (SUTURE) IMPLANT
SUT PROLENE 6 0 C 1 30 (SUTURE) ×7 IMPLANT
SUT PROLENE 7 0 BV 1 (SUTURE) ×3 IMPLANT
SUT PROLENE 7 0 BV1 MDA (SUTURE) ×3 IMPLANT
SUT PROLENE 8 0 BV175 6 (SUTURE) ×1 IMPLANT
SUT SILK  1 MH (SUTURE)
SUT SILK 1 MH (SUTURE) IMPLANT
SUT STEEL 6MS V (SUTURE) ×1 IMPLANT
SUT STEEL STERNAL CCS#1 18IN (SUTURE) IMPLANT
SUT STEEL SZ 6 DBL 3X14 BALL (SUTURE) ×2 IMPLANT
SUT VIC AB 1 CTX 36 (SUTURE) ×4
SUT VIC AB 1 CTX36XBRD ANBCTR (SUTURE) ×2 IMPLANT
SUT VIC AB 2-0 CT1 27 (SUTURE) ×2
SUT VIC AB 2-0 CT1 TAPERPNT 27 (SUTURE) IMPLANT
SUT VIC AB 2-0 CTX 27 (SUTURE) IMPLANT
SUT VIC AB 3-0 SH 27 (SUTURE)
SUT VIC AB 3-0 SH 27X BRD (SUTURE) IMPLANT
SUT VIC AB 3-0 X1 27 (SUTURE) IMPLANT
SUT VICRYL 4-0 PS2 18IN ABS (SUTURE) IMPLANT
SUTURE E-PAK OPEN HEART (SUTURE) ×2 IMPLANT
SYSTEM SAHARA CHEST DRAIN ATS (WOUND CARE) ×2 IMPLANT
TAPE PAPER 2X10 WHT MICROPORE (GAUZE/BANDAGES/DRESSINGS) ×1 IMPLANT
TOWEL OR 17X24 6PK STRL BLUE (TOWEL DISPOSABLE) ×4 IMPLANT
TOWEL OR 17X26 10 PK STRL BLUE (TOWEL DISPOSABLE) ×4 IMPLANT
TRAY FOLEY IC TEMP SENS 14FR (CATHETERS) ×2 IMPLANT
TUBE FEEDING 8FR 16IN STR KANG (MISCELLANEOUS) ×2 IMPLANT
TUBING INSUFFLATION 10FT LAP (TUBING) ×2 IMPLANT
UNDERPAD 30X30 INCONTINENT (UNDERPADS AND DIAPERS) ×2 IMPLANT
WATER STERILE IRR 1000ML POUR (IV SOLUTION) ×4 IMPLANT

## 2012-01-10 NOTE — Transfer of Care (Signed)
Immediate Anesthesia Transfer of Care Note  Patient: Lance [name]  Procedure(s) Performed: Procedure(s) (LRB) with comments: CORONARY ARTERY BYPASS GRAFTING (CABG) (N/A) - CABG x four; using left internal mammary artery and right leg greater saphenous vein harvested endoscopically  Patient Location: SICU  Anesthesia Type: General  Level of Consciousness: Patient remains intubated per anesthesia plan  Airway & Oxygen Therapy: Patient remains intubated per anesthesia plan  Post-op Assessment: Post -op Vital signs reviewed and stable  Post vital signs: stable  Complications: No apparent anesthesia complications

## 2012-01-10 NOTE — Brief Op Note (Addendum)
01/10/2012  11:21 AM  PATIENT:  Lance [name]  66 y.o. male  PRE-OPERATIVE DIAGNOSIS:  CAD  POST-OPERATIVE DIAGNOSIS:  CAD  PROCEDURE:  Procedure(s): CORONARY ARTERY BYPASS GRAFTING x 4 (LIMIA-LAD, SVG-D, SVG-OM, SVG-PD), EVH right leg  SURGEON:  Surgeon(s): Loreli Slot, MD  ASSISTANT: Coral Ceo, PA-C  ANESTHESIA:   general  PATIENT CONDITION:  ICU - intubated and hemodynamically stable.  PRE-OPERATIVE WEIGHT: 101 kg  XC= 71 min  CPB = 108 min  Platelets given for thrombocytopenia Weaned from bypass without inotropes Good quality vein and IMA, good targets PL branches too small to graft

## 2012-01-10 NOTE — Progress Notes (Signed)
Patient ID: Lance [name], male   DOB: February 27, 1946, 66 y.o.   MRN: [id number]  TCTS evening rounds:  Hemodynamically stable Slow waking up but is now awake. Urine output good CT output low  CBC    Component Value Date/Time   WBC 8.1 01/10/2012 1844   RBC 3.77* 01/10/2012 1844   HGB 10.5* 01/10/2012 1846   HCT 31.0* 01/10/2012 1846   PLT 107* 01/10/2012 1844   MCV 85.4 01/10/2012 1844   MCH 30.0 01/10/2012 1844   MCHC 35.1 01/10/2012 1844   RDW 13.7 01/10/2012 1844   LYMPHSABS 1.9 12/27/2011 1247   MONOABS 0.5 12/27/2011 1247   EOSABS 0.1 12/27/2011 1247   BASOSABS 0.0 12/27/2011 1247    BMET    Component Value Date/Time   NA 139 01/10/2012 1846   K 4.5 01/10/2012 1846   CL 107 01/10/2012 1846   CO2 23 01/07/2012 1630   GLUCOSE 121* 01/10/2012 1846   BUN 14 01/10/2012 1846   CREATININE 1.10 01/10/2012 1846   CREATININE 0.95 10/22/2011 0847   CALCIUM 9.8 01/07/2012 1630   GFRNONAA 87* 01/07/2012 1630   GFRAA >90 01/07/2012 1630    Wean vent as tolerated.
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
## 2012-01-10 NOTE — OR Nursing (Signed)
12:00 noon - 1st call to SICU, and call to vol. Desk to inform family off pump.   2nd call to SICU @ 12:30pm

## 2012-01-10 NOTE — Interval H&P Note (Signed)
History and Physical Interval Note:  01/10/2012 7:24 AM  Lance [name]  has presented today for surgery, with the diagnosis of CAD  The various methods of treatment have been discussed with the patient and family. After consideration of risks, benefits and other options for treatment, the patient has consented to  Procedure(s) (LRB) with comments: CORONARY ARTERY BYPASS GRAFTING (CABG) (N/A) as a surgical intervention .  The patient's history has been reviewed, patient examined, no change in status, stable for surgery.  I have reviewed the patient's chart and labs.  Questions were answered to the patient's satisfaction.     HENDRICKSON,STEVEN C

## 2012-01-10 NOTE — Anesthesia Procedure Notes (Addendum)
Date/Time: 01/10/2012 8:20 AM Performed by: Ellin Goodie   Procedure Name: Intubation Date/Time: 01/10/2012 7:54 AM Performed by: Ellin Goodie Pre-anesthesia Checklist: Patient identified, Emergency Drugs available, Suction available, Patient being monitored and Timeout performed Patient Re-evaluated:Patient Re-evaluated prior to inductionOxygen Delivery Method: Circle system utilized Preoxygenation: Pre-oxygenation with 100% oxygen Intubation Type: IV induction Ventilation: Mask ventilation without difficulty Laryngoscope Size: Mac and 3 Grade View: Grade II Tube type: Oral Tube size: 8.0 mm Number of attempts: 1 Airway Equipment and Method: Stylet Placement Confirmation: ETT inserted through vocal cords under direct vision,  positive ETCO2 and breath sounds checked- equal and bilateral Secured at: 23 cm Tube secured with: Tape Dental Injury: Teeth and Oropharynx as per pre-operative assessment     01/10/12 PA catheter:   Routine monitors. Timeout, sterile prep, drape, FBP R neck.  Trendelenburg position.  1% Lido local, finder and trocar RIJ 1st pass with US guidance.  Cordis placed over J wire. PA catheter in easily.  Sterile dressing applied.  Patient tolerated well, VSS.  Sandford Craze, MD

## 2012-01-10 NOTE — Anesthesia Preprocedure Evaluation (Addendum)
Anesthesia Evaluation  Patient identified by MRN, date of birth, ID band Patient awake    Reviewed: Allergy & Precautions, H&P , NPO status , Patient's Chart, lab work & pertinent test results, reviewed documented beta blocker date and time   Airway Mallampati: II TM Distance: <3 FB     Dental  (+) Poor Dentition, Dental Advisory Given and Edentulous Upper   Pulmonary shortness of breath and with exertion, asthma ,  breath sounds clear to auscultation        Cardiovascular hypertension, Pt. on medications + CAD + Cardiac Defibrillator     Neuro/Psych Anxiety Depression TIA   GI/Hepatic GERD-  Medicated and Controlled,  Endo/Other  diabetes, Poorly Controlled, Type 1, Insulin Dependent  Renal/GU      Musculoskeletal   Abdominal (+)  Abdomen: soft. Bowel sounds: normal.  Peds  Hematology   Anesthesia Other Findings   Reproductive/Obstetrics                        Anesthesia Physical Anesthesia Plan  ASA: III  Anesthesia Plan: General   Post-op Pain Management:    Induction: Intravenous  Airway Management Planned: Oral ETT  Additional Equipment: Arterial line, CVP, PA Cath and Ultrasound Guidance Line Placement  Intra-op Plan:   Post-operative Plan: Post-operative intubation/ventilation  Informed Consent: I have reviewed the patients History and Physical, chart, labs and discussed the procedure including the risks, benefits and alternatives for the proposed anesthesia with the patient or authorized representative who has indicated his/her understanding and acceptance.     Plan Discussed with: CRNA and Surgeon  Anesthesia Plan Comments:         Anesthesia Quick Evaluation

## 2012-01-10 NOTE — Procedures (Addendum)
Extubation Procedure Note  Patient Details:   Name: Lance [name] DOB: Aug 02, 1945 MRN: [id number]   Airway Documentation:  Airway 7.5 mm (Active)  Secured at (cm) 22 cm 01/10/2012  8:47 PM  Measured From Lips 01/10/2012  8:47 PM  Secured Location Right 01/10/2012  8:47 PM  Secured By Pink Tape 01/10/2012  8:47 PM  Site Condition Dry 01/10/2012  8:47 PM    Evaluation  O2 sats: stable throughout Complications: No apparent complications Patient did tolerate procedure well. Bilateral Breath Sounds: Clear 02sat 100, HR 90, RR 21, BP 115/74 NIF-30, VC 1.7 liters Suctioning: Airway Yes  Ave Filter 01/10/2012, 10:41 PM

## 2012-01-10 NOTE — Anesthesia Postprocedure Evaluation (Signed)
Anesthesia Post Note  Patient: Lance [name]  Procedure(s) Performed: Procedure(s) (LRB): CORONARY ARTERY BYPASS GRAFTING (CABG) (N/A)  Anesthesia type: General  Patient location: ICU  Post pain: Pain level controlled  Post assessment: Post-op Vital signs reviewed  Last Vitals:  Filed Vitals:   01/10/12 1445  BP:   Pulse: 89  Temp: 36.4 C  Resp: 12    Post vital signs: stable  Level of consciousness: Patient remains intubated per anesthesia plan  Complications: No apparent anesthesia complications

## 2012-01-10 NOTE — Anesthesia Postprocedure Evaluation (Signed)
  Anesthesia Post-op Note  Patient: Lance [name]  Procedure(s) Performed: Procedure(s) (LRB) with comments: CORONARY ARTERY BYPASS GRAFTING (CABG) (N/A) - CABG x four; using left internal mammary artery and right leg greater saphenous vein harvested endoscopically  Patient Location: SICU  Anesthesia Type: General  Level of Consciousness: Patient remains intubated per anesthesia plan  Airway and Oxygen Therapy: Patient remains intubated per anesthesia plan  Post-op Pain: none  Post-op Assessment: Patient's Cardiovascular Status Stable, Respiratory Function Stable and Pain level controlled  Post-op Vital Signs: stable  Complications: No apparent anesthesia complications

## 2012-01-11 ENCOUNTER — Inpatient Hospital Stay (HOSPITAL_COMMUNITY): Payer: Medicare Other

## 2012-01-11 ENCOUNTER — Encounter (HOSPITAL_COMMUNITY): Payer: Self-pay | Admitting: Thoracic Surgery (Cardiothoracic Vascular Surgery)

## 2012-01-11 LAB — CBC
HCT: 29 % — ABNORMAL LOW (ref 39.0–52.0)
HCT: 26.7 % — ABNORMAL LOW (ref 39.0–52.0)
Hemoglobin: 10 g/dL — ABNORMAL LOW (ref 13.0–17.0)
Hemoglobin: 9 g/dL — ABNORMAL LOW (ref 13.0–17.0)
MCH: 29.8 pg (ref 26.0–34.0)
MCH: 29.1 pg (ref 26.0–34.0)
MCHC: 34.5 g/dL (ref 30.0–36.0)
MCHC: 33.7 g/dL (ref 30.0–36.0)
MCV: 86.3 fL (ref 78.0–100.0)
MCV: 86.4 fL (ref 78.0–100.0)
Platelets: 95 10*3/uL — ABNORMAL LOW (ref 150–400)
Platelets: 90 10*3/uL — ABNORMAL LOW (ref 150–400)
RBC: 3.36 MIL/uL — ABNORMAL LOW (ref 4.22–5.81)
RBC: 3.09 MIL/uL — ABNORMAL LOW (ref 4.22–5.81)
RDW: 13.9 % (ref 11.5–15.5)
RDW: 13.9 % (ref 11.5–15.5)
WBC: 8.9 10*3/uL (ref 4.0–10.5)
WBC: 9.1 10*3/uL (ref 4.0–10.5)

## 2012-01-11 LAB — PREPARE PLATELET PHERESIS: Unit division: 0

## 2012-01-11 LAB — GLUCOSE, CAPILLARY
Glucose-Capillary: 153 mg/dL — ABNORMAL HIGH (ref 70–99)
Glucose-Capillary: 157 mg/dL — ABNORMAL HIGH (ref 70–99)
Glucose-Capillary: 168 mg/dL — ABNORMAL HIGH (ref 70–99)
Glucose-Capillary: 179 mg/dL — ABNORMAL HIGH (ref 70–99)
Glucose-Capillary: 202 mg/dL — ABNORMAL HIGH (ref 70–99)
Glucose-Capillary: 210 mg/dL — ABNORMAL HIGH (ref 70–99)

## 2012-01-11 LAB — BASIC METABOLIC PANEL
BUN: 16 mg/dL (ref 6–23)
CO2: 25 mEq/L (ref 19–32)
Calcium: 8.7 mg/dL (ref 8.4–10.5)
Chloride: 104 mEq/L (ref 96–112)
Creatinine, Ser: 1.13 mg/dL (ref 0.50–1.35)
GFR calc Af Amer: 78 mL/min — ABNORMAL LOW (ref >90)
GFR calc non Af Amer: 67 mL/min — ABNORMAL LOW (ref >90)
Glucose, Bld: 178 mg/dL — ABNORMAL HIGH (ref 70–99)
Potassium: 4.4 mEq/L (ref 3.5–5.1)
Sodium: 138 mEq/L (ref 135–145)

## 2012-01-11 LAB — POCT I-STAT, CHEM 8
BUN: 22 mg/dL (ref 6–23)
Calcium, Ion: 1.23 mmol/L (ref 1.13–1.30)
Chloride: 98 mEq/L (ref 96–112)
Creatinine, Ser: 1.5 mg/dL — ABNORMAL HIGH (ref 0.50–1.35)
Glucose, Bld: 193 mg/dL — ABNORMAL HIGH (ref 70–99)
HCT: 26 % — ABNORMAL LOW (ref 39.0–52.0)
Hemoglobin: 8.8 g/dL — ABNORMAL LOW (ref 13.0–17.0)
Potassium: 4.3 mEq/L (ref 3.5–5.1)
Sodium: 134 mEq/L — ABNORMAL LOW (ref 135–145)
TCO2: 23 mmol/L (ref 0–100)

## 2012-01-11 LAB — MAGNESIUM
Magnesium: 2 mg/dL (ref 1.5–2.5)
Magnesium: 1.7 mg/dL (ref 1.5–2.5)

## 2012-01-11 LAB — CREATININE, SERUM
Creatinine, Ser: 1.62 mg/dL — ABNORMAL HIGH (ref 0.50–1.35)
GFR calc Af Amer: 51 mL/min — ABNORMAL LOW (ref >90)
GFR calc non Af Amer: 44 mL/min — ABNORMAL LOW (ref >90)

## 2012-01-11 LAB — TYPE AND SCREEN
ABO/RH(D): O POS
Antibody Screen: NEGATIVE

## 2012-01-11 MED ORDER — POTASSIUM CHLORIDE CRYS ER 20 MEQ PO TBCR
20.0000 meq | EXTENDED_RELEASE_TABLET | Freq: Two times a day (BID) | ORAL | Status: DC
  Administered 2012-01-11 – 2012-01-17 (×13): 20 meq via ORAL
  Filled 2012-01-11 (×14): qty 1

## 2012-01-11 MED ORDER — INSULIN ASPART 100 UNIT/ML ~~LOC~~ SOLN
3.0000 [IU] | Freq: Three times a day (TID) | SUBCUTANEOUS | Status: DC
  Administered 2012-01-11 (×2): 3 [IU] via SUBCUTANEOUS

## 2012-01-11 MED ORDER — FUROSEMIDE 10 MG/ML IJ SOLN
40.0000 mg | Freq: Once | INTRAMUSCULAR | Status: AC
  Administered 2012-01-11: 40 mg via INTRAVENOUS
  Filled 2012-01-11: qty 4

## 2012-01-11 MED ORDER — INSULIN REGULAR BOLUS VIA INFUSION
0.0000 [IU] | Freq: Three times a day (TID) | INTRAVENOUS | Status: DC | PRN
  Filled 2012-01-11: qty 10

## 2012-01-11 MED ORDER — SODIUM CHLORIDE 0.9 % IV SOLN
INTRAVENOUS | Status: DC | PRN
  Administered 2012-01-11: 1.2 [IU]/h via INTRAVENOUS
  Filled 2012-01-11: qty 1

## 2012-01-11 MED ORDER — INSULIN ASPART 100 UNIT/ML ~~LOC~~ SOLN
0.0000 [IU] | SUBCUTANEOUS | Status: DC
  Administered 2012-01-11 (×2): 2 [IU] via SUBCUTANEOUS
  Administered 2012-01-11 (×2): 8 [IU] via SUBCUTANEOUS

## 2012-01-11 MED ORDER — PANCRELIPASE (LIP-PROT-AMYL) 24000-76000 UNITS PO CPEP: 2.0000 | ORAL_CAPSULE | Freq: Three times a day (TID) | ORAL | Status: DC

## 2012-01-11 MED ORDER — METOPROLOL TARTRATE 25 MG PO TABS
25.0000 mg | ORAL_TABLET | Freq: Two times a day (BID) | ORAL | Status: DC
  Administered 2012-01-11 – 2012-01-12 (×4): 25 mg via ORAL
  Filled 2012-01-11 (×6): qty 1

## 2012-01-11 MED ORDER — LORATADINE 10 MG PO TABS
10.0000 mg | ORAL_TABLET | Freq: Every day | ORAL | Status: DC | PRN
  Filled 2012-01-11: qty 1

## 2012-01-11 MED ORDER — TRAZODONE HCL 100 MG PO TABS
100.0000 mg | ORAL_TABLET | Freq: Every day | ORAL | Status: DC
  Administered 2012-01-11 – 2012-01-16 (×6): 100 mg via ORAL
  Filled 2012-01-11 (×7): qty 1

## 2012-01-11 MED ORDER — METOPROLOL TARTRATE 25 MG/10 ML ORAL SUSPENSION: 25.0000 mg | Freq: Two times a day (BID) | ORAL | Status: DC

## 2012-01-11 MED ORDER — PANCRELIPASE (LIP-PROT-AMYL) 12000-38000 UNITS PO CPEP
2.0000 | ORAL_CAPSULE | Freq: Three times a day (TID) | ORAL | Status: DC
  Administered 2012-01-11 – 2012-01-17 (×18): 2 via ORAL
  Filled 2012-01-11 (×21): qty 2

## 2012-01-11 MED ORDER — ASPIRIN-DIPYRIDAMOLE ER 25-200 MG PO CP12
2.0000 | ORAL_CAPSULE | Freq: Two times a day (BID) | ORAL | Status: DC
Start: 2012-01-11 — End: 2012-01-17
  Administered 2012-01-11 – 2012-01-17 (×13): 2 via ORAL
  Filled 2012-01-11 (×14): qty 2

## 2012-01-11 MED ORDER — INSULIN GLARGINE 100 UNIT/ML ~~LOC~~ SOLN
50.0000 [IU] | Freq: Every day | SUBCUTANEOUS | Status: DC
  Administered 2012-01-11: 50 [IU] via SUBCUTANEOUS

## 2012-01-11 MED ORDER — INSULIN GLARGINE 100 UNIT/ML ~~LOC~~ SOLN: 50.0000 [IU] | Freq: Every morning | SUBCUTANEOUS | Status: DC

## 2012-01-11 NOTE — Progress Notes (Signed)
Patient ID: Lance [name], male   DOB: May 11, 1945, 66 y.o.   MRN: [id number]                    [address]            [place] [address]          [phone]     1 Day Post-Op Procedure(s) (LRB): CORONARY ARTERY BYPASS GRAFTING (CABG) (N/A)  Total Length of Stay:  LOS: 1 day  BP 115/68  Pulse 83  Temp 98.5 F (36.9 C) (Oral)  Resp 26  Wt 232 lb 9.4 oz (105.5 kg)  SpO2 95%     . sodium chloride 20 mL/hr at 01/11/12 0700  . sodium chloride 20 mL (01/10/12 1330)  . sodium chloride    . dexmedetomidine Stopped (01/10/12 1600)  . lactated ringers 20 mL/hr at 01/11/12 0700  . nitroGLYCERIN Stopped (01/10/12 1830)  . phenylephrine (NEO-SYNEPHRINE) Adult infusion Stopped (01/11/12 0100)  . DISCONTD: insulin (NOVOLIN-R) infusion Stopped (01/10/12 1730)     Lab Results  Component Value Date   WBC 9.1 01/11/2012   HGB 8.8* 01/11/2012   HCT 26.0* 01/11/2012   PLT 90* 01/11/2012   GLUCOSE 193* 01/11/2012   CHOL 203* 12/02/2011   TRIG 204* 12/02/2011   HDL 31* 12/02/2011   LDLCALC 131* 12/02/2011   ALT 19 01/07/2012   AST 16 01/07/2012   NA 134* 01/11/2012   K 4.3 01/11/2012   CL 98 01/11/2012   CREATININE 1.50* 01/11/2012   BUN 22 01/11/2012   CO2 25 01/11/2012   TSH 2.434 12/01/2011   INR 1.39 01/10/2012   HGBA1C 7.8* 01/07/2012   MICROALBUR 1.3 08/29/2007   Thrombocytopenia, 90,000 decreased asa dose  mildly confused,  but talking and knows he is at [facility] uop all day will give additional lasix, Cr 1.5  Delight Ovens MD  Beeper [phone] Office [phone] 01/11/2012 6:00 PM

## 2012-01-11 NOTE — Progress Notes (Signed)
1 Day Post-Op Procedure(s) (LRB): CORONARY ARTERY BYPASS GRAFTING (CABG) (N/A) Subjective: Some incisional pain  Objective: Vital signs in last 24 hours: Temp:  [97.2 F (36.2 C)-100.4 F (38 C)] 99.9 F (37.7 C) (09/10 0700) Pulse Rate:  [83-92] 90  (09/10 0700) Cardiac Rhythm:  [-] Atrial paced (09/10 0700) Resp:  [12-24] 18  (09/10 0700) BP: (75-137)/(55-87) 113/68 mmHg (09/10 0700) SpO2:  [96 %-100 %] 99 % (09/10 0700) Arterial Line BP: (78-180)/(53-107) 120/61 mmHg (09/10 0700) FiO2 (%):  [0.5 %-50 %] 40 % (09/09 2201) Weight:  [232 lb 9.4 oz (105.5 kg)] 232 lb 9.4 oz (105.5 kg) (09/10 0600)  Hemodynamic parameters for last 24 hours: PAP: (27-49)/(18-32) 41/27 mmHg CO:  [3.4 L/min-5.6 L/min] 5.3 L/min CI:  [1.6 L/min/m2-2.6 L/min/m2] 2.5 L/min/m2  Intake/Output from previous day: 09/09 0701 - 09/10 0700 In: 6991.9 [I.V.:5232.9; EXBMW:413; NG/GT:30; IV Piggyback:885] Out: 6272 [Urine:3425; Emesis/NG output:100; Blood:2327; Chest Tube:420] Intake/Output this shift:    General appearance: alert and no distress Neurologic: intact Heart: regular rate and rhythm Lungs: diminished breath sounds bibasilar Abdomen: mildly distended, nontender  Lab Results:  Basename 01/11/12 0415 01/10/12 1846 01/10/12 1844  WBC 8.9 -- 8.1  HGB 10.0* 10.5* --  HCT 29.0* 31.0* --  PLT 95* -- 107*   BMET:  Basename 01/11/12 0415 01/10/12 1846  NA 138 139  K 4.4 4.5  CL 104 107  CO2 25 --  GLUCOSE 178* 121*  BUN 16 14  CREATININE 1.13 1.10  CALCIUM 8.7 --    PT/INR:  Basename 01/10/12 1327  LABPROT 17.3*  INR 1.39   ABG    Component Value Date/Time   PHART 7.330* 01/10/2012 2231   HCO3 26.9* 01/10/2012 2231   TCO2 28 01/10/2012 2231   ACIDBASEDEF 2.0 01/10/2012 2044   O2SAT 98.0 01/10/2012 2231   CBG (last 3)   Basename 01/11/12 0729 01/11/12 0405 01/11/12 0031  GLUCAP 157* 168* 179*    Assessment/Plan: S/P Procedure(s) (LRB): CORONARY ARTERY BYPASS GRAFTING (CABG)  (N/A) POD # 1 CABG x4  CV- good hemodynamics- d/c swan  RESP- bibasilar atelectasis- pulmonary toilet, IS  RENAL- volume overloaded- diurese  ENDO- CBG elevated- lantus, SSI  Thrombocytopenia- no heparin, follow  Anemia secondary to ABL- follow   LOS: 1 day    Evaleigh Mccamy C 01/11/2012

## 2012-01-11 NOTE — Op Note (Signed)
NAME:  Lance [name], Lance [name] NO.:  [id number]  MEDICAL RECORD NO.:  [id number]  LOCATION:  2313                         FACILITY:  [facility]  PHYSICIAN:  Salvatore Decent. Dorris Fetch, M.D.DATE OF BIRTH:  [birth year]  DATE OF PROCEDURE:  01/10/2012 DATE OF DISCHARGE:                              OPERATIVE REPORT   PREOPERATIVE DIAGNOSIS:  Severe three-vessel coronary artery disease with progressive angina.  POSTOPERATIVE DIAGNOSIS:  Severe three-vessel coronary artery disease with progressive angina.  PROCEDURE:  Median sternotomy, extracorporeal circulation, coronary artery bypass grafting x4 (left internal mammary artery to left anterior descending artery, saphenous vein graft to first diagonal, saphenous vein graft to first obtuse marginal, saphenous vein graft to posterior descending), endoscopic vein harvest, right leg.  SURGEON:  Salvatore Decent. Dorris Fetch, M.D.  ASSISTANT:  Coral Ceo, P.A.  ANESTHESIA:  General.  FINDINGS:  Saphenous vein and mammary artery good-quality conduits, good- quality targets at the site of the anastomoses, posterolateral branches were too small to graft.  CLINICAL NOTE:  Mr. Keeble is a 66 year old gentleman with a longstanding history of coronary artery disease who presents with progressive anginal symptoms.  At catheterization, he had severe three-vessel coronary artery disease and was referred for coronary artery bypass grafting. The indications, risks, benefits, and alternatives were discussed in detail with the patient.  He understood and accepted the risks and agreed to proceed.  OPERATIVE NOTE:  Mr. Graumann was brought to the preoperative holding area on January 10, 2012.  There, Anesthesia placed a Swan-Ganz catheter and an arterial blood pressure monitoring line.  Intravenous antibiotics were administered.  The patient was taken to the operating room, anesthetized, and intubated.  A Foley catheter was placed.  The chest, abdomen,  and legs were prepped and draped in usual sterile fashion.  A median sternotomy was performed and the left internal mammary artery was harvested in standard fashion.  This was relatively difficult due to the patient's chest wall being stiff and noncompliant.  Simultaneously with the mammary takedown, an incision was made in the medial aspect of the right leg at the level of the knee and the greater saphenous vein was harvested endoscopically from the right leg.  It was a good quality conduit as well as the mammary artery.  2000 units of heparin was administered during the vessel harvest.  The remainder of the full heparin dose was given prior to opening the pericardium.  The pericardium was opened.  The ascending aorta was inspected.  There was no evidence of atherosclerotic disease.  After confirming adequate anticoagulation with ACT measurement, the aorta was cannulated via concentric 2-0 Ethibond pledgeted pursestring sutures.  A dual-stage venous cannula was placed via pursestring suture in the right atrial appendage.  Care was taken to try to avoid displacement of the atrial lead.  The cannula passed easily.  Cardiopulmonary bypass was instituted and the patient was cooled to 32 degrees Celsius.  The coronary arteries were inspected and an anastomotic sites were chosen.  Of note, the posterolateral branches of the right coronary artery were too small to graft.  The remaining vessels all were suitable for grafting.  The conduits were inspected and cut to length.  A foam  pad was placed in the pericardium to insulate the heart and protect the left phrenic nerve.  A temperature probe was placed in the myocardial septum and the cardioplegic cannula was placed in the ascending aorta.  The aorta was crossclamped.  The left ventricle was emptied via the aortic root vent.  Cardiac arrest then was achieved with combination of cold antegrade blood cardioplegia and topical iced saline.  One  liter of cardioplegia was administered.  The myocardial septal temperature fell to 10 degrees Celsius and there was a good diastolic arrest.  Following distal anastomoses were performed.  First, a reversed saphenous vein graft was placed end-to-side to the posterior descending branch of the right coronary artery.  This was a 1.5-mm good-quality target vessel.  The vein was anastomosed end-to-side with a running 7-0 Prolene suture.  All anastomoses were probed proximally and distally at their completion to ensure patency. Cardioplegia was administered at completion of each vein graft to assess flow and hemostasis.  Next, a reversed saphenous vein graft was placed end-to-side to the first diagonal branch to the LAD.  This vessel was slightly smaller, but did accept a 1.5-mm probe.  The vein was anastomosed end-to-side with the running 7-0 Prolene suture.  Again with cardioplegia administration, there was good flow and good hemostasis.  Next, a reversed saphenous vein graft was placed end-to-side to obtuse marginal 1, this was a dominant lateral vessel that did have some plaquing proximally with some mild plaquing distally, but was good quality at the site of the anastomosis.  An end-to-side anastomosis was performed with a running 7-0 Prolene suture.  Next, the left internal mammary artery was brought through a window in the pericardium.  The distal end was beveled.  It was then anastomosed end-to-side to the LAD.  The LAD was grafted just beyond a plaque, it was a 2-mm vessel.  There was no significant plaque distal to the anastomosis.  The vessel became intramyocardial just distal to the anastomosis.  The mammary was a 2-mm good-quality conduit and an end-to-side anastomosis was performed with a running 8-0 Prolene suture.  At the completion of the mammary to LAD anastomosis, the bulldog clamp was briefly removed to inspect for hemostasis.  Immediate and rapid septal rewarming was  noted.  The bulldog clamp was replaced and the mammary pedicle was tacked to the epicardial surface of the heart with 6-0 Prolene sutures.  Additional cardioplegia was administered down the vein graft and in the aortic root.  The vein grafts then were cut to length.  The cardioplegic cannula was removed from the ascending aorta.  The proximal vein graft anastomoses were performed to 4.5-mm punch aortotomies with running 6-0 Prolene sutures.  At the completion of final proximal anastomosis, the patient was placed in Trendelenburg position.  Lidocaine was administered.  The aortic root was de-aired and the aortic crossclamp was removed.  Total crossclamp time was 71 minutes.  The patient spontaneously resumed the sinus rhythm and did not require the defibrillation.  While rewarming was completed, all proximal and distal anastomoses were inspected for hemostasis.  Epicardial pacing wires placed on the right ventricle and right atrium.  When the patient had rewarmed to a core temperature of 37 degrees Celsius, he was weaned from cardiopulmonary bypass on the first attempt.  He was in sinus rhythm and on no inotropic support.  The initial cardiac index was 1.8 liters/minutes/meter squared.  After the patient had been off bypass briefly, atrial pacing was initiated at 90 beats per  minute and the patient had improved hemodynamics with the atrial pacing.  A test dose of protamine was administered and was well tolerated.  The atrial and aortic cannulae were removed.  The remainder of the protamine was administered without incident.  Platelets were transfused for a low platelet count of 76,000.  The chest was irrigated with warm saline. The pericardium was reapproximated with interrupted 3-0 silk sutures. The left pleural and mediastinal chest tubes were placed through separate subcostal incisions.  The sternum was closed with interrupted single and double heavy gauge stainless steel wires.   The pectoralis fascia, subcutaneous tissue, and skin were closed in standard fashion. All sponge, needle, and instrument counts were correct at the end of the procedure.  The patient has taken from the operating room to the surgical intensive care unit in good condition.     Salvatore Decent Dorris Fetch, M.D.     SCH/MEDQ  D:  01/10/2012  T:  01/11/2012  Job:  [id number]

## 2012-01-11 NOTE — Care Management Note (Unsigned)
    Page 1 of 1   01/11/2012     3:18:36 PM   CARE MANAGEMENT NOTE 01/11/2012  Patient:  Lance [name], Lance [name]   Account Number:  [id number]  Date Initiated:  01/11/2012  Documentation initiated by:  Darra Rosa  Subjective/Objective Assessment:   PT S/P CABG X 4 ON 01/10/12.  PTA, PT INDEPENDENT, LIVES WITH WIFE, [name].     Action/Plan:   MET WITH PT AND WIFE TO DISCUSS DC PLANS.  WIFE TO PROVIDE 24HR CARE AT DISCHARGE.  WILL FOLLOW FOR HOME NEEDS AS PT PROGRESSES.   Anticipated DC Date:  01/15/2012   Anticipated DC Plan:  HOME W HOME HEALTH SERVICES      DC Planning Services  CM consult      Choice offered to / List presented to:             Status of service:  In process, will continue to follow Medicare Important Message given?   (If response is "NO", the following Medicare IM given date fields will be blank) Date Medicare IM given:   Date Additional Medicare IM given:    Discharge Disposition:    Per UR Regulation:  Reviewed for med. necessity/level of care/duration of stay  If discussed at Long Length of Stay Meetings, dates discussed:    Comments:

## 2012-01-12 ENCOUNTER — Inpatient Hospital Stay (HOSPITAL_COMMUNITY): Payer: Medicare Other

## 2012-01-12 LAB — CBC
HCT: 25.4 % — ABNORMAL LOW (ref 39.0–52.0)
Hemoglobin: 8.7 g/dL — ABNORMAL LOW (ref 13.0–17.0)
MCH: 29.5 pg (ref 26.0–34.0)
MCHC: 34.3 g/dL (ref 30.0–36.0)
MCV: 86.1 fL (ref 78.0–100.0)
Platelets: 79 10*3/uL — ABNORMAL LOW (ref 150–400)
RBC: 2.95 MIL/uL — ABNORMAL LOW (ref 4.22–5.81)
RDW: 13.8 % (ref 11.5–15.5)
WBC: 8.8 10*3/uL (ref 4.0–10.5)

## 2012-01-12 LAB — BASIC METABOLIC PANEL
BUN: 22 mg/dL (ref 6–23)
CO2: 28 mEq/L (ref 19–32)
Calcium: 8.4 mg/dL (ref 8.4–10.5)
Chloride: 99 mEq/L (ref 96–112)
Creatinine, Ser: 1.46 mg/dL — ABNORMAL HIGH (ref 0.50–1.35)
GFR calc Af Amer: 57 mL/min — ABNORMAL LOW (ref >90)
GFR calc non Af Amer: 49 mL/min — ABNORMAL LOW (ref >90)
Glucose, Bld: 126 mg/dL — ABNORMAL HIGH (ref 70–99)
Potassium: 4 mEq/L (ref 3.5–5.1)
Sodium: 132 mEq/L — ABNORMAL LOW (ref 135–145)

## 2012-01-12 LAB — GLUCOSE, CAPILLARY
Glucose-Capillary: 121 mg/dL — ABNORMAL HIGH (ref 70–99)
Glucose-Capillary: 123 mg/dL — ABNORMAL HIGH (ref 70–99)
Glucose-Capillary: 124 mg/dL — ABNORMAL HIGH (ref 70–99)
Glucose-Capillary: 127 mg/dL — ABNORMAL HIGH (ref 70–99)
Glucose-Capillary: 129 mg/dL — ABNORMAL HIGH (ref 70–99)
Glucose-Capillary: 130 mg/dL — ABNORMAL HIGH (ref 70–99)
Glucose-Capillary: 133 mg/dL — ABNORMAL HIGH (ref 70–99)
Glucose-Capillary: 134 mg/dL — ABNORMAL HIGH (ref 70–99)
Glucose-Capillary: 136 mg/dL — ABNORMAL HIGH (ref 70–99)
Glucose-Capillary: 139 mg/dL — ABNORMAL HIGH (ref 70–99)
Glucose-Capillary: 147 mg/dL — ABNORMAL HIGH (ref 70–99)
Glucose-Capillary: 149 mg/dL — ABNORMAL HIGH (ref 70–99)
Glucose-Capillary: 164 mg/dL — ABNORMAL HIGH (ref 70–99)
Glucose-Capillary: 170 mg/dL — ABNORMAL HIGH (ref 70–99)
Glucose-Capillary: 170 mg/dL — ABNORMAL HIGH (ref 70–99)
Glucose-Capillary: 175 mg/dL — ABNORMAL HIGH (ref 70–99)
Glucose-Capillary: 184 mg/dL — ABNORMAL HIGH (ref 70–99)

## 2012-01-12 MED ORDER — ALPRAZOLAM 0.5 MG PO TABS
1.0000 mg | ORAL_TABLET | Freq: Every day | ORAL | Status: DC
  Administered 2012-01-12 – 2012-01-16 (×5): 1 mg via ORAL
  Filled 2012-01-12 (×2): qty 1
  Filled 2012-01-12 (×4): qty 2

## 2012-01-12 MED ORDER — TRAMADOL HCL 50 MG PO TABS: 100.0000 mg | ORAL_TABLET | Freq: Four times a day (QID) | ORAL | Status: DC | PRN

## 2012-01-12 MED ORDER — INSULIN ASPART 100 UNIT/ML ~~LOC~~ SOLN
0.0000 [IU] | SUBCUTANEOUS | Status: DC
  Administered 2012-01-12 (×2): 2 [IU] via SUBCUTANEOUS
  Administered 2012-01-12 – 2012-01-13 (×2): 4 [IU] via SUBCUTANEOUS
  Administered 2012-01-13: 2 [IU] via SUBCUTANEOUS
  Administered 2012-01-13 (×2): 4 [IU] via SUBCUTANEOUS

## 2012-01-12 MED ORDER — INSULIN GLARGINE 100 UNIT/ML ~~LOC~~ SOLN
35.0000 [IU] | Freq: Two times a day (BID) | SUBCUTANEOUS | Status: DC
  Administered 2012-01-12 (×2): 35 [IU] via SUBCUTANEOUS

## 2012-01-12 MED ORDER — INSULIN ASPART 100 UNIT/ML ~~LOC~~ SOLN: 0.0000 [IU] | SUBCUTANEOUS | Status: DC

## 2012-01-12 MED ORDER — ALPRAZOLAM 0.5 MG PO TABS
0.5000 mg | ORAL_TABLET | Freq: Three times a day (TID) | ORAL | Status: DC | PRN
  Administered 2012-01-12 – 2012-01-17 (×3): 0.5 mg via ORAL
  Filled 2012-01-12 (×3): qty 1

## 2012-01-12 MED ORDER — TRAMADOL HCL 50 MG PO TABS
50.0000 mg | ORAL_TABLET | Freq: Four times a day (QID) | ORAL | Status: DC | PRN
  Administered 2012-01-12 (×2): 50 mg via ORAL
  Administered 2012-01-13 – 2012-01-17 (×9): 100 mg via ORAL
  Filled 2012-01-12 (×7): qty 2
  Filled 2012-01-12: qty 1
  Filled 2012-01-12: qty 2
  Filled 2012-01-12: qty 1
  Filled 2012-01-12: qty 2

## 2012-01-12 MED ORDER — FUROSEMIDE 10 MG/ML IJ SOLN
40.0000 mg | Freq: Once | INTRAMUSCULAR | Status: AC
  Administered 2012-01-12: 40 mg via INTRAVENOUS

## 2012-01-12 MED ORDER — TRAMADOL HCL 50 MG PO TABS: 50.0000 mg | ORAL_TABLET | Freq: Four times a day (QID) | ORAL | Status: DC | PRN

## 2012-01-12 NOTE — Progress Notes (Signed)
[name] has become belligerently confused this morning.  When attempting to help [name] stand to get his weight, he began accusing staff of kidnapping him, pushing and pulling with arms, attempting to pinch staff.  He did not follow basic verbal instructions to calm down or lie back in bed without using arms.  With the assistance of two other staff, we assisted [name] back to the top of the bed.
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
## 2012-01-12 NOTE — Progress Notes (Signed)
[facility] BRIEF SICU PROGRESS NOTE  2 Days Post-Op  S/P Procedure(s) (LRB): CORONARY ARTERY BYPASS GRAFTING (CABG) (N/A)   Overall stable day Mental status improved since resuming Xanax Sinus rhythm, BP stable O2 sats 96-98% on 2 L/min UOP adequate  Plan: Continue current plan  OWEN,CLARENCE H 01/12/2012 6:27 PM

## 2012-01-13 ENCOUNTER — Inpatient Hospital Stay (HOSPITAL_COMMUNITY): Payer: Medicare Other

## 2012-01-13 LAB — GLUCOSE, CAPILLARY
Glucose-Capillary: 108 mg/dL — ABNORMAL HIGH (ref 70–99)
Glucose-Capillary: 142 mg/dL — ABNORMAL HIGH (ref 70–99)
Glucose-Capillary: 162 mg/dL — ABNORMAL HIGH (ref 70–99)
Glucose-Capillary: 166 mg/dL — ABNORMAL HIGH (ref 70–99)
Glucose-Capillary: 169 mg/dL — ABNORMAL HIGH (ref 70–99)
Glucose-Capillary: 180 mg/dL — ABNORMAL HIGH (ref 70–99)

## 2012-01-13 LAB — CBC
HCT: 27 % — ABNORMAL LOW (ref 39.0–52.0)
Hemoglobin: 9.2 g/dL — ABNORMAL LOW (ref 13.0–17.0)
MCH: 29.3 pg (ref 26.0–34.0)
MCHC: 34.1 g/dL (ref 30.0–36.0)
MCV: 86 fL (ref 78.0–100.0)
Platelets: 88 10*3/uL — ABNORMAL LOW (ref 150–400)
RBC: 3.14 MIL/uL — ABNORMAL LOW (ref 4.22–5.81)
RDW: 14.2 % (ref 11.5–15.5)
WBC: 8.6 10*3/uL (ref 4.0–10.5)

## 2012-01-13 LAB — BASIC METABOLIC PANEL
BUN: 14 mg/dL (ref 6–23)
CO2: 27 mEq/L (ref 19–32)
Calcium: 8.8 mg/dL (ref 8.4–10.5)
Chloride: 104 mEq/L (ref 96–112)
Creatinine, Ser: 0.93 mg/dL (ref 0.50–1.35)
GFR calc Af Amer: >90 mL/min (ref >90)
GFR calc non Af Amer: 88 mL/min — ABNORMAL LOW (ref >90)
Glucose, Bld: 136 mg/dL — ABNORMAL HIGH (ref 70–99)
Potassium: 4.1 mEq/L (ref 3.5–5.1)
Sodium: 137 mEq/L (ref 135–145)

## 2012-01-13 MED ORDER — ALUM & MAG HYDROXIDE-SIMETH 200-200-20 MG/5ML PO SUSP: 15.0000 mL | ORAL | Status: DC | PRN

## 2012-01-13 MED ORDER — SODIUM CHLORIDE 0.9 % IJ SOLN
3.0000 mL | Freq: Two times a day (BID) | INTRAMUSCULAR | Status: DC
  Administered 2012-01-13 – 2012-01-17 (×7): 3 mL via INTRAVENOUS

## 2012-01-13 MED ORDER — INSULIN GLARGINE 100 UNIT/ML ~~LOC~~ SOLN
30.0000 [IU] | Freq: Two times a day (BID) | SUBCUTANEOUS | Status: DC
  Administered 2012-01-13 – 2012-01-17 (×9): 30 [IU] via SUBCUTANEOUS

## 2012-01-13 MED ORDER — MOVING RIGHT ALONG BOOK
Freq: Once | Status: DC
  Filled 2012-01-13: qty 1

## 2012-01-13 MED ORDER — METFORMIN HCL 500 MG PO TABS
1000.0000 mg | ORAL_TABLET | Freq: Two times a day (BID) | ORAL | Status: DC
  Administered 2012-01-13 – 2012-01-17 (×9): 1000 mg via ORAL
  Filled 2012-01-13 (×11): qty 2

## 2012-01-13 MED ORDER — METOPROLOL TARTRATE 50 MG PO TABS
50.0000 mg | ORAL_TABLET | Freq: Two times a day (BID) | ORAL | Status: DC
  Administered 2012-01-13 – 2012-01-16 (×8): 50 mg via ORAL
  Filled 2012-01-13 (×10): qty 1

## 2012-01-13 MED ORDER — INSULIN ASPART 100 UNIT/ML ~~LOC~~ SOLN
0.0000 [IU] | Freq: Three times a day (TID) | SUBCUTANEOUS | Status: DC
  Administered 2012-01-13: 0 [IU] via SUBCUTANEOUS
  Administered 2012-01-14: 3 [IU] via SUBCUTANEOUS
  Administered 2012-01-16 (×2): 2 [IU] via SUBCUTANEOUS
  Administered 2012-01-17: 3 [IU] via SUBCUTANEOUS

## 2012-01-13 MED ORDER — SODIUM CHLORIDE 0.9 % IJ SOLN: 3.0000 mL | INTRAMUSCULAR | Status: DC | PRN

## 2012-01-13 MED ORDER — FUROSEMIDE 40 MG PO TABS
40.0000 mg | ORAL_TABLET | Freq: Every day | ORAL | Status: DC
  Administered 2012-01-13 – 2012-01-17 (×5): 40 mg via ORAL
  Filled 2012-01-13 (×5): qty 1

## 2012-01-13 MED ORDER — LOSARTAN POTASSIUM 50 MG PO TABS
50.0000 mg | ORAL_TABLET | Freq: Every day | ORAL | Status: DC
  Administered 2012-01-14: 50 mg via ORAL
  Filled 2012-01-13 (×2): qty 1

## 2012-01-13 MED ORDER — MAGNESIUM HYDROXIDE 400 MG/5ML PO SUSP
30.0000 mL | Freq: Every day | ORAL | Status: DC | PRN
  Administered 2012-01-17: 30 mL via ORAL
  Filled 2012-01-13: qty 30

## 2012-01-13 MED ORDER — SODIUM CHLORIDE 0.9 % IV SOLN: 250.0000 mL | INTRAVENOUS | Status: DC | PRN

## 2012-01-13 MED ORDER — GUAIFENESIN ER 600 MG PO TB12
1200.0000 mg | ORAL_TABLET | Freq: Two times a day (BID) | ORAL | Status: DC
  Administered 2012-01-13 – 2012-01-17 (×8): 1200 mg via ORAL
  Filled 2012-01-13 (×9): qty 2

## 2012-01-13 NOTE — Progress Notes (Signed)
This note also relates to the following rows which could not be included: BP - Cannot attach notes to unvalidated device data MAP (mmHg) - Cannot attach notes to unvalidated device data Resp - Cannot attach notes to unvalidated device data SpO2 - Cannot attach notes to unvalidated device data   5mg  lopressor IV given

## 2012-01-13 NOTE — Progress Notes (Signed)
2.5mg  lopressor IV given
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
## 2012-01-14 ENCOUNTER — Inpatient Hospital Stay (HOSPITAL_COMMUNITY): Payer: Medicare Other

## 2012-01-14 LAB — BASIC METABOLIC PANEL
BUN: 14 mg/dL (ref 6–23)
CO2: 26 mEq/L (ref 19–32)
Calcium: 8.7 mg/dL (ref 8.4–10.5)
Chloride: 106 mEq/L (ref 96–112)
Creatinine, Ser: 0.84 mg/dL (ref 0.50–1.35)
GFR calc Af Amer: >90 mL/min (ref >90)
GFR calc non Af Amer: >90 mL/min (ref >90)
Glucose, Bld: 127 mg/dL — ABNORMAL HIGH (ref 70–99)
Potassium: 4.1 mEq/L (ref 3.5–5.1)
Sodium: 141 mEq/L (ref 135–145)

## 2012-01-14 LAB — GLUCOSE, CAPILLARY
Glucose-Capillary: 116 mg/dL — ABNORMAL HIGH (ref 70–99)
Glucose-Capillary: 130 mg/dL — ABNORMAL HIGH (ref 70–99)
Glucose-Capillary: 115 mg/dL — ABNORMAL HIGH (ref 70–99)
Glucose-Capillary: 140 mg/dL — ABNORMAL HIGH (ref 70–99)

## 2012-01-14 LAB — CBC
HCT: 24.4 % — ABNORMAL LOW (ref 39.0–52.0)
Hemoglobin: 8.3 g/dL — ABNORMAL LOW (ref 13.0–17.0)
MCH: 29.1 pg (ref 26.0–34.0)
MCHC: 34 g/dL (ref 30.0–36.0)
MCV: 85.6 fL (ref 78.0–100.0)
Platelets: 108 10*3/uL — ABNORMAL LOW (ref 150–400)
RBC: 2.85 MIL/uL — ABNORMAL LOW (ref 4.22–5.81)
RDW: 14.1 % (ref 11.5–15.5)
WBC: 6.7 10*3/uL (ref 4.0–10.5)

## 2012-01-14 MED ORDER — IPRATROPIUM-ALBUTEROL 18-103 MCG/ACT IN AERO
2.0000 | INHALATION_SPRAY | Freq: Four times a day (QID) | RESPIRATORY_TRACT | Status: DC
  Administered 2012-01-14 – 2012-01-15 (×5): 2 via RESPIRATORY_TRACT
  Filled 2012-01-14: qty 14.7

## 2012-01-14 MED ORDER — MOXIFLOXACIN HCL 400 MG PO TABS
400.0000 mg | ORAL_TABLET | Freq: Every day | ORAL | Status: DC
  Administered 2012-01-14 – 2012-01-17 (×4): 400 mg via ORAL
  Filled 2012-01-14 (×4): qty 1
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
## 2012-01-14 NOTE — Progress Notes (Addendum)
[address]            [place] [address]          [phone]     4 Days Post-Op Procedure(s) (LRB): CORONARY ARTERY BYPASS GRAFTING (CABG) (N/A)  Subjective: "I'm wheezing, and I want to get out of here so I can go see my doctor."  +prodctive cough with purulent appearing sputum.  Walking independently in halls.  Still having some confusion.    Objective: Vital signs in last 24 hours: Patient Vitals for the past 24 hrs:  BP Temp Temp src Pulse Resp SpO2 Weight  01/14/12 0618 157/86 mmHg 98.2 F (36.8 C) Oral 94  18  96 % 227 lb 8.2 oz (103.2 kg)  01/13/12 2158 174/92 mmHg 99.1 F (37.3 C) Oral 103  18  95 % -  01/13/12 1759 - 97.4 F (36.3 C) - - - 96 % -  01/13/12 1700 - - - 92  24  99 % -  01/13/12 1600 150/93 mmHg - - 90  16  97 % -  01/13/12 1555 - 97.5 F (36.4 C) Oral - - - -  01/13/12 1500 114/65 mmHg - - 84  12  97 % -  01/13/12 1400 96/60 mmHg - - 86  11  96 % -  01/13/12 1300 127/75 mmHg - - 85  20  95 % -  01/13/12 1220 - 98.3 F (36.8 C) Oral - - - -  01/13/12 1200 115/86 mmHg - - 85  21  96 % -  01/13/12 1100 138/79 mmHg - - 86  24  97 % -  01/13/12 1000 125/69 mmHg - - 98  14  96 % -  01/13/12 0946 148/77 mmHg - - 102  - - -  01/13/12 0900 148/77 mmHg - - 98  19  97 % -   Current Weight  01/14/12 227 lb 8.2 oz (103.2 kg)   PRE-OPERATIVE WEIGHT: 101 kg   Intake/Output from previous day: 09/12 0701 - 09/13 0700 In: 80 [I.V.:80] Out: 1390 [Urine:1390]  CBGs 166-169-127-116   PHYSICAL EXAM:  Heart: RRR Lungs: few scattered rhonchi and exp wheezes bilaterally Wound: Clean and dry Extremities: Mild LE edema Neuro: grossly intact, reorients easily   Lab Results: CBC: Basename 01/14/12 0510 01/13/12 0428  WBC 6.7 8.6  HGB 8.3* 9.2*  HCT 24.4* 27.0*  PLT 108* 88*   BMET:  Basename 01/14/12 0510 01/13/12 0428  NA 141 137  K 4.1 4.1  CL 106 104  CO2 26 27  GLUCOSE 127* 136*  BUN 14 14  CREATININE  0.84 0.93  CALCIUM 8.7 8.8    PT/INR: No results found for this basename: LABPROT,INR in the last 72 hours    Assessment/Plan: S/P Procedure(s) (LRB): CORONARY ARTERY BYPASS GRAFTING (CABG) (N/A)  CV- BPs trending up. May need to further adjust meds.  Neuro- still with delirium.  Continue to monitor.  Pulm- productive cough with purulent sputum.  No fever, WBC nl.  Continue aggressive pulm toilet, check sputum cx.  May need to add abx for bronchitis.  Vol overload- diurese.  DM- sugars generally stable.  May need to resume [place]. Continue Lantus, Metformin.  CRPI.   LOS: 4 days    COLLINS,GINA H 01/14/2012  Patient seen and examined. Agree with above. He is wheezing and has purulent sputum. CXR OK, likely bronchitis-  will start antibiotics and bronchodilators D/c epw MS has improved, but still mildly agitated

## 2012-01-14 NOTE — Progress Notes (Signed)
EPW DC'd per MD order and protocol.  V wire resistant, Gershon Crane PA asked to assist, removed without complications.  Wires intact, no bleeding or ectopy noted, pt tolerated well.  Instructed to remain in bed x 1 hr, frequent vitals initiated.  First set WNL.  Will continue to monitor closely. Ave Filter

## 2012-01-14 NOTE — Progress Notes (Signed)
Pt ambulated approx. 600 ft this morning, also reports walking 2 more times early this am.  Walked on RA without assistance or walker, pt brought back to room, SOB, doesn't seem to know own limitations post op.  Put back to bed, VSS, sats 94 RA, bed alarm activated so pt will not ambulate without calling again.  Will continue to monitor closely. Ave Filter

## 2012-01-14 NOTE — Progress Notes (Addendum)
CARDIAC REHAB PHASE I   PRE:  Rate/Rhythm: 104 ST  BP:  Supine: 154/94  Sitting:   Standing:    SaO2: 95 RA  MODE:  Ambulation: 550 ft   POST:  Rate/Rhythem: 105 ST  BP:  Supine: 171/103 recheck after 5 min. 152/93  Sitting:   Standing:    SaO2: 96 RA during walk 95 after walk 1055-1150 On arrival pt in bed sleeping. Assisted X 1 to ambulate. Pt wobbly and wanted to hold to side rail in hall. Got walker and pt more steady with that. Pace slow, DOE audible wheezing with exertion . Pt able to walk 550 feet Back to bed after walk. BP after walk elevated. Reported BP to pt's RN. Pt agrees to Outpt. CRP in [place], will send referral.Pt's  wife watching Recovering from [facility] video.  Beatrix Fetters

## 2012-01-15 LAB — GLUCOSE, CAPILLARY: Glucose-Capillary: 100 mg/dL — ABNORMAL HIGH (ref 70–99)

## 2012-01-15 MED ORDER — LOSARTAN POTASSIUM 50 MG PO TABS
100.0000 mg | ORAL_TABLET | Freq: Every day | ORAL | Status: DC
  Administered 2012-01-15 – 2012-01-17 (×3): 100 mg via ORAL
  Filled 2012-01-15 (×3): qty 2

## 2012-01-15 MED ORDER — IPRATROPIUM-ALBUTEROL 18-103 MCG/ACT IN AERO
2.0000 | INHALATION_SPRAY | Freq: Three times a day (TID) | RESPIRATORY_TRACT | Status: DC
  Administered 2012-01-15 – 2012-01-17 (×5): 2 via RESPIRATORY_TRACT

## 2012-01-15 NOTE — Progress Notes (Addendum)
[address]            [place] [address]          [phone]     5 Days Post-Op  Procedure(s) (LRB): CORONARY ARTERY BYPASS GRAFTING (CABG) (N/A) Subjective: Feels well, anxious to go home  Objective  Telemetry sinus rhythm  Temp:  [98.6 F (37 C)-99.5 F (37.5 C)] 98.6 F (37 C) (09/14 0600) Pulse Rate:  [86-98] 86  (09/14 0600) Resp:  [18-19] 18  (09/14 0600) BP: (117-157)/(75-92) 122/75 mmHg (09/14 0600) SpO2:  [95 %-96 %] 96 % (09/14 0744) Weight:  [223 lb 5.2 oz (101.3 kg)] 223 lb 5.2 oz (101.3 kg) (09/14 0422)   Intake/Output Summary (Last 24 hours) at 01/15/12 0939 Last data filed at 01/14/12 2100  Gross per 24 hour  Intake      0 ml  Output   1375 ml  Net  -1375 ml    Physical Examination: General appearance - alert, well appearing, and in no distress Chest - dim in bases Heart - normal rate, regular rhythm, normal S1, S2, no murmurs, rubs, clicks or gallops Abdomen - soft, mild distension Extremities - minor edema Skin - incisions healing well    Lab Results:  Basename 01/14/12 0510 01/13/12 0428  NA 141 137  K 4.1 4.1  CL 106 104  CO2 26 27  GLUCOSE 127* 136*  BUN 14 14  CREATININE 0.84 0.93  CALCIUM 8.7 8.8  MG -- --  PHOS -- --   No results found for this basename: AST:2,ALT:2,ALKPHOS:2,BILITOT:2,PROT:2,ALBUMIN:2 in the last 72 hours No results found for this basename: LIPASE:2,AMYLASE:2 in the last 72 hours  Basename 01/14/12 0510 01/13/12 0428  WBC 6.7 8.6  NEUTROABS -- --  HGB 8.3* 9.2*  HCT 24.4* 27.0*  MCV 85.6 86.0  PLT 108* 88*   No results found for this basename: CKTOTAL:4,CKMB:4,TROPONINI:4 in the last 72 hours No components found with this basename: POCBNP:3 No results found for this basename: DDIMER in the last 72 hours No results found for this basename: HGBA1C in the last 72 hours No results found for this basename: CHOL,HDL,LDLCALC,TRIG,CHOLHDL in the last 72 hours No results found  for this basename: TSH,T4TOTAL,FREET3,T3FREE,THYROIDAB in the last 72 hours No results found for this basename: VITAMINB12,FOLATE,FERRITIN,TIBC,IRON,RETICCTPCT in the last 72 hours  Medications: Scheduled    . acetaminophen  1,000 mg Oral Q6H  . albuterol-ipratropium  2 puff Inhalation QID  . ALPRAZolam  1 mg Oral QHS  . bisacodyl  10 mg Oral Daily   Or  . bisacodyl  10 mg Rectal Daily  . citalopram  20 mg Oral Daily  . dipyridamole-aspirin  2 capsule Oral BID  . docusate sodium  200 mg Oral Daily  . furosemide  40 mg Oral Daily  . gabapentin  600 mg Oral BID  . guaiFENesin  1,200 mg Oral BID  . insulin aspart  0-20 Units Subcutaneous TID WC  . insulin glargine  30 Units Subcutaneous BID  . lipase/protease/amylase  2 capsule Oral TID WC  . losartan  50 mg Oral Daily  . metFORMIN  1,000 mg Oral BID WC  . metoprolol tartrate  50 mg Oral BID  . moving right along book   Does not apply Once  . moxifloxacin  400 mg Oral Daily  . pantoprazole  40 mg Oral Q1200  . potassium chloride  20  mEq Oral BID  . simvastatin  20 mg Oral q1800  . sodium chloride  3 mL Intravenous Q12H  . traZODone  100 mg Oral QHS     Radiology/Studies:  Dg Chest 2 View  01/14/2012  *RADIOLOGY REPORT*  Clinical Data: Post CABG, shortness of breath, chest pain  CHEST - 2 VIEW  Comparison: 01/13/2012; 01/12/2012  Findings: Grossly unchanged enlarged cardiac silhouette and mediastinal contours post median sternotomy and CABG. Interval removal of right jugular approach vascular sheath.  Stable positioning of left anterior chest wall dual lead AICD / pacemaker. Unchanged small bilateral effusions.  Improved aeration of the bilateral lung bases with persistent minimal basilar opacities. Grossly unchanged bones including the lower cervical ACDF, incompletely evaluated.  IMPRESSION: Improved aeration of the bilateral lung bases with persistent trace bilateral effusions and minimal bibasilar opacities favored to represent  atelectasis.  No pneumothorax.   Original Report Authenticated By: Waynard Reeds, M.D.     INR: Will add last result for INR, ABG once components are confirmed Will add last 4 CBG results once components are confirmed  Assessment/Plan: S/P Procedure(s) (LRB): CORONARY ARTERY BYPASS GRAFTING (CABG) (N/A)  1. Doing well, pulm sx improved 2  neuro status improving 3 bp still elevated at times, increase losartan 4 EP managing AICD issues, needs to stay in hospital till resolved 5 sugars ok control 6 cont diuresis and pulm toilet    LOS: 5 days    GOLD,WAYNE E 9/14/20139:39 AM   patient seen and examined. Agree with above. To have ICD reinterrogated on Monday

## 2012-01-15 NOTE — Progress Notes (Signed)
CARDIAC REHAB PHASE I   PRE:  Rate/Rhythm: 89 sinus  BP:  Sitting: 117/67   SaO2: 94 RA  MODE:  Ambulation: 690 ft   POST:  Rate/Rhythem: 98 sinus  BP:  Sitting: 138/79     SaO2: 95 RA  Pt ambulated 67ft with assist x1.  Pt tolerated walk well without c/o.  Pt would like to be able to go outside, mentioned to RN about letting them go to [facility] for fresh air.  Pt returned to chair after walk with call bell in reach.  Reviewed d/c education with pt and wife.  Pt voiced understanding.   Fabio Pierce, MA, ACSM RCEP [phone] Hazle Nordmann

## 2012-01-15 NOTE — Progress Notes (Signed)
Pt ambulated approximately 150 ft without walker on RA. NT assisted him back to room. Pt has SOB but doesn't seem to be distressed. Call light is within reach. Encouraged him to call before ambulating again. Will continue to monitor.

## 2012-01-16 LAB — GLUCOSE, CAPILLARY
Glucose-Capillary: 91 mg/dL (ref 70–99)
Glucose-Capillary: 157 mg/dL — ABNORMAL HIGH (ref 70–99)
Glucose-Capillary: 124 mg/dL — ABNORMAL HIGH (ref 70–99)
Glucose-Capillary: 115 mg/dL — ABNORMAL HIGH (ref 70–99)

## 2012-01-16 MED ORDER — OXYCODONE-ACETAMINOPHEN 5-325 MG PO TABS
1.0000 | ORAL_TABLET | ORAL | Status: DC | PRN
  Administered 2012-01-16: 2 via ORAL
  Filled 2012-01-16: qty 2

## 2012-01-16 NOTE — Progress Notes (Addendum)
[address]            [place] [address]          [phone]     6 Days Post-Op  Procedure(s) (LRB): CORONARY ARTERY BYPASS GRAFTING (CABG) (N/A) Subjective: A bit anxious this am, c/o increased sputum production and cough  Objective  Telemetry SR/ST/PVC's  Temp:  [98.3 F (36.8 C)-99.1 F (37.3 C)] 99.1 F (37.3 C) (09/15 0546) Pulse Rate:  [95-104] 96  (09/15 0546) Resp:  [18] 18  (09/15 0546) BP: (135-170)/(76-93) 152/86 mmHg (09/15 0546) SpO2:  [91 %-96 %] 91 % (09/15 0747) Weight:  [219 lb 14.4 oz (99.746 kg)] 219 lb 14.4 oz (99.746 kg) (09/15 0546)   Intake/Output Summary (Last 24 hours) at 01/16/12 0934 Last data filed at 01/16/12 0500  Gross per 24 hour  Intake    240 ml  Output   2300 ml  Net  -2060 ml       General appearance: alert, cooperative and no distress Heart: regular rate and rhythm and S1, S2 normal Lungs: dim left base Abdomen: benign exam Extremities: min edema Wound: incisions healing well  Lab Results:  Basename 01/14/12 0510  NA 141  K 4.1  CL 106  CO2 26  GLUCOSE 127*  BUN 14  CREATININE 0.84  CALCIUM 8.7  MG --  PHOS --   No results found for this basename: AST:2,ALT:2,ALKPHOS:2,BILITOT:2,PROT:2,ALBUMIN:2 in the last 72 hours No results found for this basename: LIPASE:2,AMYLASE:2 in the last 72 hours  Basename 01/14/12 0510  WBC 6.7  NEUTROABS --  HGB 8.3*  HCT 24.4*  MCV 85.6  PLT 108*   No results found for this basename: CKTOTAL:4,CKMB:4,TROPONINI:4 in the last 72 hours No components found with this basename: POCBNP:3 No results found for this basename: DDIMER in the last 72 hours No results found for this basename: HGBA1C in the last 72 hours No results found for this basename: CHOL,HDL,LDLCALC,TRIG,CHOLHDL in the last 72 hours No results found for this basename: TSH,T4TOTAL,FREET3,T3FREE,THYROIDAB in the last 72 hours No results found for this basename:  VITAMINB12,FOLATE,FERRITIN,TIBC,IRON,RETICCTPCT in the last 72 hours  Medications: Scheduled    . acetaminophen  1,000 mg Oral Q6H  . albuterol-ipratropium  2 puff Inhalation TID  . ALPRAZolam  1 mg Oral QHS  . bisacodyl  10 mg Oral Daily   Or  . bisacodyl  10 mg Rectal Daily  . citalopram  20 mg Oral Daily  . dipyridamole-aspirin  2 capsule Oral BID  . docusate sodium  200 mg Oral Daily  . furosemide  40 mg Oral Daily  . gabapentin  600 mg Oral BID  . guaiFENesin  1,200 mg Oral BID  . insulin aspart  0-20 Units Subcutaneous TID WC  . insulin glargine  30 Units Subcutaneous BID  . lipase/protease/amylase  2 capsule Oral TID WC  . losartan  100 mg Oral Daily  . metFORMIN  1,000 mg Oral BID WC  . metoprolol tartrate  50 mg Oral BID  . moving right along book   Does not apply Once  . moxifloxacin  400 mg Oral Daily  . pantoprazole  40 mg Oral Q1200  . potassium chloride  20 mEq Oral BID  . simvastatin  20 mg Oral q1800  . sodium chloride  3 mL Intravenous Q12H  . traZODone  100 mg Oral QHS  . DISCONTD: albuterol-ipratropium  2 puff Inhalation QID  . DISCONTD: losartan  50 mg Oral Daily     Radiology/Studies:  No results found.  INR: Will add last result for INR, ABG once components are confirmed Will add last 4 CBG results once components are confirmed  Assessment/Plan: S/P Procedure(s) (LRB): CORONARY ARTERY BYPASS GRAFTING (CABG) (N/A)  1. Add flutter valve, cont pulm toilet, avelox 2 may need additional agent for BP, losartan increased yesterday - follow 3 sugars well controlled 4 EP to eval tomorrow   LOS: 6 days    GOLD,WAYNE E 9/15/20139:34 AM    Patient seen and examined. Agree with above. Home once ICD issues resolved

## 2012-01-17 LAB — GLUCOSE, CAPILLARY
Glucose-Capillary: 86 mg/dL (ref 70–99)
Glucose-Capillary: 141 mg/dL — ABNORMAL HIGH (ref 70–99)

## 2012-01-17 MED ORDER — GUAIFENESIN ER 600 MG PO TB12: 1200.0000 mg | ORAL_TABLET | Freq: Two times a day (BID) | ORAL | Status: DC

## 2012-01-17 MED ORDER — POTASSIUM CHLORIDE CRYS ER 20 MEQ PO TBCR: 20.0000 meq | EXTENDED_RELEASE_TABLET | Freq: Every day | ORAL | Status: DC

## 2012-01-17 MED ORDER — MOXIFLOXACIN HCL 400 MG PO TABS: 400.0000 mg | ORAL_TABLET | Freq: Every day | ORAL | Status: DC

## 2012-01-17 MED ORDER — LOSARTAN POTASSIUM 100 MG PO TABS: 100.0000 mg | ORAL_TABLET | Freq: Every day | ORAL | Status: DC

## 2012-01-17 MED ORDER — INFLUENZA VIRUS VACC SPLIT PF IM SUSP
0.5000 mL | Freq: Once | INTRAMUSCULAR | Status: DC
  Filled 2012-01-17: qty 0.5

## 2012-01-17 MED ORDER — METOPROLOL TARTRATE 100 MG PO TABS
100.0000 mg | ORAL_TABLET | Freq: Two times a day (BID) | ORAL | Status: DC
  Administered 2012-01-17: 100 mg via ORAL
  Filled 2012-01-17 (×2): qty 1

## 2012-01-17 MED ORDER — FUROSEMIDE 40 MG PO TABS: 40.0000 mg | ORAL_TABLET | Freq: Every day | ORAL | Status: DC

## 2012-01-17 MED ORDER — OXYCODONE-ACETAMINOPHEN 5-325 MG PO TABS: 1.0000 | ORAL_TABLET | ORAL | Status: DC | PRN

## 2012-01-17 NOTE — Progress Notes (Signed)
Pt d/c home with instructions, r/x, and f/u appointments. Pt and wife verbalized understanding of instructions, home with wife, escorted out by [facility] NT.

## 2012-01-17 NOTE — Progress Notes (Signed)
D/C CTS per protocol and as ordered, steris applied.  Will continue to monitor. Ninetta Lights

## 2012-01-17 NOTE — Discharge Summary (Signed)
[address]            [place] [address]          [phone]         Discharge Summary  Name: Lance [name] DOB: January 14, 1946 66 y.o. MRN: [id number]   Admission Date: 01/10/2012 Discharge Date: 01/17/2012    Admitting Diagnosis: Severe 3 vessel coronary artery disease   Discharge Diagnosis:  Severe 3 vessel coronary artery disease Postoperative delirium Expected postoperative blood loss anemia Tracheobronchitis  Past Medical History  Diagnosis Date  . ASCVD (arteriosclerotic cardiovascular disease)     70% mid left anterior descending lesion on cath in 06/1995; left anterior desending DES placed in 8/03 and RCA stent in 9/03; captain 3/05 revealed 90% second marginal for which PCI was performed, 70% PDA and a total obstruction of the first diagonal and marginal; sudden cardiac death in [place] in 31-Oct-2003 for which automatic implantable cardiac defibrillator placed; negativ stress nuclear 10/07  . Hypertension   . Hyperlipidemia   . Tobacco abuse     100 pack/year comsuption; cigarettes discontinued 2003; all tobacco products in 2008  . CVD (cerebrovascular disease) 05/2008    Transient ischemic attack; carotid ultrasound-plaque without focal disease  . Diabetes mellitus     Insulin requirement  . Degenerative joint disease 2002    C-spine fusion   . Erectile dysfunction   . Anxiety and depression   . Benign prostatic hypertrophy   . Other testicular hypofunction   . Esophageal reflux   . Diabetes mellitus without mention of complication   . Coronary artery disease   . Allergic rhinitis, cause unspecified   . S/P endoscopy Dec 2011    RMR: nl esophagus, hyperplastic polyp, active gastritis, no H.pylori.   . ICD (implantable cardiac defibrillator) in place   . Pacemaker   . Hx-TIA (transient ischemic attack) 2010       Procedures: CORONARY ARTERY BYPASS GRAFTING x 4 (Left internal mammary artery to left anterior  descending, saphenous vein graft to diagonal, saphenous vein graft to obtuse marginal, saphenous vein graft to posterior descending), endoscopic vein harvest right leg  on 01/10/2012    HPI:  The patient is a 66 y.o. male with a complex cardiac history dating back to 2003, when he had PTCA and stent placement. He later had a sudden death episode in [place] in 2005 and an AICD was placed. He did well with no new cardiac issues between 2007 and 2012. In January of this year, he started noting that he fatigued more easily and had generally less energy. He later began having symptoms of chest tightness and shortness of breath with exertion. He would often feel like he was smothering when these episodes occurred. Over the past couple of months this has progressed to the point where he now will have chest tightness and diaphoresis and shortness of breath even at rest. Sometimes his symptoms wake him up at night.   About 3 weeks ago, he had an episode of garbled speech and right arm weakness. He was seen and treated at [facility] and diagnosed with a TIA. His symptoms resolved without need for thrombolytics. He was seen in consultation by Dr. Sandria Manly. He did have a carotid duplex done, it showed no hemodynamically significant stenoses. He has not had any further neurologic symptoms since that episode.  He underwent cardiac catheterization by Dr. Excell Seltzer on  12/30/2011, which showed severe 3 vessel coronary artery disease with preserved LV function, not felt to be amenable to percutaneous intervention.  He was referred to Dr. Dorris Fetch for cardiac surgery evaluation.  Dr. Dorris Fetch agreed with the need for CABG.  All risks, benefits and alternatives of surgery were explained in detail, and the patient agreed to proceed.     Hospital Course:  The patient was admitted to [facility] on 01/10/2012. The patient was taken to the operating room and underwent the above procedure.    The postoperative course has  generally been stable.  He did have some initial confusion, but remained otherwise neurologically intact.  He was felt to have a post-operative delirium, and this was treated conservatively.  His mental status currently is stable and at baseline.  He developed a productive cough with purulent sputum, and was treated with po Avelox, aggressive pulmonary toilet measures, and mucolytics.  He has been hypertensive, requiring titration of his blood pressure meds.  He was started on Lasix for volume overload and is diuresing well. His blood sugars have been stable on his home medication regimen.    His ICD was interrogated in the early post-op period, and he was noted to have low impedance readings.  His device was re-interrogated on today's date, and impedance readings were improved (HV RV to SVC and can 31, up from 26, and RV to can 44, up from 36).    He has overall progressed well.  He has remained afebrile and all vital signs are stable.  He is ambulating independently in the halls, and is tolerating a regular diet. His pulmonary status is improved, and he is off oxygen.   It is felt that since his ICD measurements are improved, and he is otherwise doing well, he may be discharged home on today's date.     Recent vital signs:  Filed Vitals:   01/17/12 1055  BP: 138/67  Pulse: 103  Temp:   Resp:     Recent laboratory studies:  CBC: Lab Results  Component Value Date   WBC 6.7 01/14/2012   HGB 8.3* 01/14/2012   HCT 24.4* 01/14/2012   MCV 85.6 01/14/2012   PLT 108* 01/14/2012    BMET:  Lab Results  Component Value Date   CREATININE 0.84 01/14/2012   BUN 14 01/14/2012   NA 141 01/14/2012   K 4.1 01/14/2012   CL 106 01/14/2012   CO2 26 01/14/2012       Discharge Medications:     Medication List     As of 01/17/2012 11:20 AM    STOP taking these medications         HYDROcodone-acetaminophen 7.5-325 MG per tablet   Commonly known as: NORCO      TAKE these medications          ALPRAZolam 1 MG tablet   Commonly known as: XANAX   Take 1 mg by mouth at bedtime. For sleep      aspirin 81 MG chewable tablet   Chew 1 tablet (81 mg total) by mouth daily.      bifidobacterium infantis capsule   Take 1 capsule by mouth daily.      BYETTA 5 MCG PEN    Inject 5 Units into the skin 2 (two) times daily.      citalopram 20 MG tablet   Commonly known as: CELEXA   Take 20 mg by mouth daily.      dipyridamole-aspirin 200-25 MG per 12 hr capsule  Commonly known as: AGGRENOX   Take 2 capsules by mouth 2 (two) times daily.      esomeprazole 40 MG capsule   Commonly known as: NEXIUM   Take 40 mg by mouth daily.      fexofenadine 180 MG tablet   Commonly known as: ALLEGRA   Take 180 mg by mouth daily as needed. For allergies      fluticasone 50 MCG/ACT nasal spray   Commonly known as: FLONASE   Place 2 sprays into the nose daily as needed. For allergies      furosemide 40 MG tablet   Commonly known as: LASIX   Take 1 tablet (40 mg total) by mouth daily. X 5 days      gabapentin 300 MG capsule   Commonly known as: NEURONTIN   Take 600 mg by mouth 2 (two) times daily.      guaiFENesin 600 MG 12 hr tablet   Commonly known as: MUCINEX   Take 2 tablets (1,200 mg total) by mouth 2 (two) times daily.      HYOSYNE PO   Take 0.1875-0.375 mg by mouth 2 (two) times daily. Takes one half to one whole tablet twice daily.      LANTUS SOLOSTAR 100 UNIT/ML injection   Generic drug: insulin glargine   Inject 60 Units into the skin at bedtime.      losartan 100 MG tablet   Commonly known as: COZAAR   Take 1 tablet (100 mg total) by mouth daily.      metFORMIN 1000 MG tablet   Commonly known as: GLUCOPHAGE   Take 1,000 mg by mouth 2 (two) times daily.      metoprolol 100 MG tablet   Commonly known as: LOPRESSOR   Take 100 mg by mouth 2 (two) times daily.      moxifloxacin 400 MG tablet   Commonly known as: AVELOX   Take 1 tablet (400 mg total) by mouth daily. X  5 days      nitroGLYCERIN 0.4 MG SL tablet   Commonly known as: NITROSTAT   Place 1 tablet (0.4 mg total) under the tongue every 5 (five) minutes as needed. For chest pain      NOVOLOG FLEXPEN 100 UNIT/ML injection   Generic drug: insulin aspart   Inject 15-18 Units into the skin 3 (three) times daily before meals. Uses according to sliding scale at home. 15 -18 UNITS 3 TIMES DAILY  15 in am  15 lunch  15 pm      oxyCODONE-acetaminophen 5-325 MG per tablet   Commonly known as: PERCOCET/ROXICET   Take 1-2 tablets by mouth every 4 (four) hours as needed for pain.      Pancrelipase (Lip-Prot-Amyl) 24000 UNITS Cpep   2 PO w/ Meals  1 PO w/ snacks      potassium chloride SA 20 MEQ tablet   Commonly known as: K-DUR,KLOR-CON   Take 1 tablet (20 mEq total) by mouth daily. X 5 days      simvastatin 20 MG tablet   Commonly known as: ZOCOR   Take 1 tablet (20 mg total) by mouth daily at 6 PM.      traZODone 100 MG tablet   Commonly known as: DESYREL   Take 100 mg by mouth at bedtime.      vitamin B-12 1000 MCG tablet   Commonly known as: CYANOCOBALAMIN   Take 1,000 mcg by mouth daily.      Vitamin D (Ergocalciferol) 50000 UNITS  Caps   Commonly known as: DRISDOL   Take 50,000 Units by mouth every 7 (seven) days. Mondays         Discharge Instructions:  The patient is to refrain from driving, heavy lifting or strenuous activity.  May shower daily and clean incisions with soap and water.  May resume regular diet.   Follow Up:      Discharge Orders    Future Appointments: Provider: Department: Dept Phone: Center:   02/08/2012 12:00 PM Loreli Slot, MD [facility] [phone] [facility]   02/09/2012 9:15 AM Marinus Maw, MD [facility] [phone] [facility]     Future Orders Please Complete By Expires   Amb Referral to Cardiac Rehabilitation         Follow-up Information    Follow up with Loreli Slot, MD. On 02/08/2012. (Have a chest  x-ray at 11:00, then see MD at 12:00)    Contact information:   [address] [place] [address] [phone]       Follow up with [name], MD. Schedule an appointment as soon as possible for a visit in 2 weeks.   Contact information:   [address] [place] [address] [phone]       Schedule an appointment as soon as possible for a visit with Lewayne Bunting, MD.   Contact information:   [address] [place] [address] [phone]           Adella Hare 01/17/2012, 11:20 AM

## 2012-01-17 NOTE — Progress Notes (Addendum)
                    [address]            [place] [address]          [phone]     7 Days Post-Op Procedure(s) (LRB): CORONARY ARTERY BYPASS GRAFTING (CABG) (N/A)  Subjective: Feels well, less coughing.    Objective: Vital signs in last 24 hours: Patient Vitals for the past 24 hrs:  BP Temp Temp src Pulse Resp SpO2 Weight  01/17/12 0438 142/81 mmHg 98.5 F (36.9 C) Oral 93  20  95 % 213 lb 6.4 oz (96.798 kg)  01/16/12 1919 140/90 mmHg 98.5 F (36.9 C) Oral 86  20  95 % -  01/16/12 1345 123/78 mmHg 98.4 F (36.9 C) Oral 83  18  95 % -  01/16/12 1338 - - - - - 92 % -  01/16/12 0747 - - - - - 91 % -   Current Weight  01/17/12 213 lb 6.4 oz (96.798 kg)  PRE-OPERATIVE WEIGHT: 101 kg    Intake/Output from previous day: 09/15 0701 - 09/16 0700 In: 480 [P.O.:480] Out: 2000 [Urine:2000]  [id number]  PHYSICAL EXAM:  Heart: RRR Lungs: rare exp wheeze, overall clear Wound: clean and dry Extremities: no significant edema    Lab Results: CBC:No results found for this basename: WBC:2,HGB:2,HCT:2,PLT:2 in the last 72 hours BMET: No results found for this basename: NA:2,K:2,CL:2,CO2:2,GLUCOSE:2,BUN:2,CREATININE:2,CALCIUM:2 in the last 72 hours  PT/INR: No results found for this basename: LABPROT,INR in the last 72 hours    Assessment/Plan: S/P Procedure(s) (LRB): CORONARY ARTERY BYPASS GRAFTING (CABG) (N/A) CV- BPs still somewhat elevated.  Will increase Lopressor to home dose, continue Losartan. DM- sugars stable. Delirium- stable,still has episodes of confusion.  Monitor. EP- for interrogation of ICD today, possibly will need lead revision or further testing.   Pulm- improving.  Avelox D#3, pulm toilet. Home once ICD issues resolved.   LOS: 7 days    COLLINS,GINA H 01/17/2012  Patient seen and examined, agree with above Home when EP issues resolved

## 2012-01-17 NOTE — Progress Notes (Signed)
Repeat ICD interrogation by industry ([name]) this AM. Impedance readings from the SVC and RV shock coil improved from last interrogation on Friday. Today's measurements: HV RV to SVC and can 31 (up from 26) and RV to can 44 (up from 36). Dr. Johney Frame discussed these findings with Dr. Ladona Ridgel, Mr. Stepanek' primary EP, and feels this is stable and can be followed as an outpatient. Please call with any additional questions.  Kimerly Rowand [employer], MMSc, PA-C Architectural technologist EP team

## 2012-01-17 NOTE — Progress Notes (Signed)
CARDIAC REHAB PHASE I   PRE:  Rate/Rhythm: 95SR  BP:  Supine: 142/79  Sitting:   Standing:    SaO2: 93%RA  MODE:  Ambulation: 890 ft   POST:  Rate/Rhythem: 108ST  BP:  Supine:   Sitting: 144/64  Standing:    SaO2: 97%RA 9528-4132 Pt tolerated well. Walked 890 ft with steady gait. Pt wants referral to [facility] Phase 2. Back to bed after walk.  Lance [name]

## 2012-01-20 ENCOUNTER — Other Ambulatory Visit: Payer: Self-pay | Admitting: Physician Assistant

## 2012-02-01 DIAGNOSIS — Z23 Encounter for immunization: Secondary | ICD-10-CM | POA: Diagnosis not present

## 2012-02-03 ENCOUNTER — Encounter: Payer: Self-pay | Admitting: *Deleted

## 2012-02-07 ENCOUNTER — Other Ambulatory Visit: Payer: Self-pay | Admitting: Surgery

## 2012-02-07 DIAGNOSIS — Z951 Presence of aortocoronary bypass graft: Secondary | ICD-10-CM

## 2012-02-08 ENCOUNTER — Ambulatory Visit (INDEPENDENT_AMBULATORY_CARE_PROVIDER_SITE_OTHER): Payer: Self-pay | Admitting: Thoracic Surgery (Cardiothoracic Vascular Surgery)

## 2012-02-08 ENCOUNTER — Ambulatory Visit
Admission: RE | Admit: 2012-02-08 | Discharge: 2012-02-08 | Disposition: A | Discharge status: Home / Self Care | Payer: Medicare Other | Source: Ambulatory Visit | Attending: Surgery | Admitting: Surgery

## 2012-02-08 ENCOUNTER — Encounter: Payer: Self-pay | Admitting: Thoracic Surgery (Cardiothoracic Vascular Surgery)

## 2012-02-08 VITALS — BP 130/80 | HR 84 | Resp 18 | Ht 69.0 in | Wt 218.0 lb

## 2012-02-08 DIAGNOSIS — Z951 Presence of aortocoronary bypass graft: Secondary | ICD-10-CM | POA: Diagnosis not present

## 2012-02-08 DIAGNOSIS — E1149 Type 2 diabetes mellitus with other diabetic neurological complication: Secondary | ICD-10-CM | POA: Diagnosis not present

## 2012-02-08 DIAGNOSIS — I2510 Atherosclerotic heart disease of native coronary artery without angina pectoris: Secondary | ICD-10-CM

## 2012-02-08 NOTE — Patient Instructions (Signed)
Do not lift over 10 pounds for another 2 weeks  You may begin driving with the precautions we discussed

## 2012-02-08 NOTE — Progress Notes (Addendum)
HPI:  [name] returns today for a scheduled postoperative followup visit. He a coronary bypass grafting x4 on September 9. He had problems with postoperative delirium initially. That resolved prior to him being discharged home. He has not had any further problems with that since discharge. He does feel occasional pain on the left side of the sternal incision, that has been improving. He has not been taking any pain medication. He has not had any recurrent anginal symptoms or shortness of breath. He did have some complaints of dizziness when he would first end up initially after discharge, that has also improved.  Past Medical History  Diagnosis Date  . ASCVD (arteriosclerotic cardiovascular disease)     70% mid left anterior descending lesion on cath in 06/1995; left anterior desending DES placed in 8/03 and RCA stent in 9/03; captain 3/05 revealed 90% second marginal for which PCI was performed, 70% PDA and a total obstruction of the first diagonal and marginal; sudden cardiac death in [place] in 2003-11-14 for which automatic implantable cardiac defibrillator placed; negativ stress nuclear 10/07  . Hypertension   . Hyperlipidemia   . Tobacco abuse     100 pack/year comsuption; cigarettes discontinued 2003; all tobacco products in 2008  . CVD (cerebrovascular disease) 05/2008    Transient ischemic attack; carotid ultrasound-plaque without focal disease  . Diabetes mellitus     Insulin requirement  . Degenerative joint disease 2002    C-spine fusion   . Erectile dysfunction   . Anxiety and depression   . Benign prostatic hypertrophy   . Other testicular hypofunction   . Esophageal reflux   . Diabetes mellitus without mention of complication   . Coronary artery disease   . Allergic rhinitis, cause unspecified   . S/P endoscopy Dec 2011    RMR: nl esophagus, hyperplastic polyp, active gastritis, no H.pylori.   . ICD (implantable cardiac defibrillator) in place   . Pacemaker   . Hx-TIA  (transient ischemic attack) 2010     Current Outpatient Prescriptions  Medication Sig Dispense Refill  . ALPRAZolam (XANAX) 1 MG tablet Take 1 mg by mouth at bedtime. For sleep      . aspirin 81 MG chewable tablet Chew 1 tablet (81 mg total) by mouth daily.  30 tablet  0  . bifidobacterium infantis (ALIGN) capsule Take 1 capsule by mouth daily.  14 capsule  0  . citalopram (CELEXA) 20 MG tablet Take 20 mg by mouth daily.        Marland Kitchen dipyridamole-aspirin (AGGRENOX) 25-200 MG per 12 hr capsule Take 2 capsules by mouth 2 (two) times daily.       Marland Kitchen esomeprazole (NEXIUM) 40 MG capsule Take 40 mg by mouth daily.       . Exenatide (BYETTA 5 MCG PEN [place]) Inject 5 Units into the skin 2 (two) times daily.       . fexofenadine (ALLEGRA) 180 MG tablet Take 180 mg by mouth daily as needed. For allergies      . fluticasone (FLONASE) 50 MCG/ACT nasal spray Place 2 sprays into the nose daily as needed. For allergies      . gabapentin (NEURONTIN) 300 MG capsule Take 600 mg by mouth 2 (two) times daily.       Marland Kitchen guaiFENesin (MUCINEX) 600 MG 12 hr tablet Take 2 tablets (1,200 mg total) by mouth 2 (two) times daily.      Marland Kitchen Hyoscyamine Sulfate (HYOSYNE PO) Take 0.1875-0.375 mg by mouth 2 (two) times daily. Takes  one half to one whole tablet twice daily.      . insulin glargine (LANTUS SOLOSTAR) 100 UNIT/ML injection Inject 60 Units into the skin at bedtime.       Marland Kitchen losartan (COZAAR) 100 MG tablet Take 1 tablet (100 mg total) by mouth daily.  60 tablet  1  . metFORMIN (GLUCOPHAGE) 1000 MG tablet Take 1,000 mg by mouth 2 (two) times daily.      . metoprolol (LOPRESSOR) 100 MG tablet Take 100 mg by mouth 2 (two) times daily.      . nitroGLYCERIN (NITROSTAT) 0.4 MG SL tablet Place 1 tablet (0.4 mg total) under the tongue every 5 (five) minutes as needed. For chest pain  25 tablet  3  . NOVOLOG FLEXPEN 100 UNIT/ML injection Inject 15-18 Units into the skin 3 (three) times daily before meals. Uses according to sliding scale  at home. 15 -18 UNITS 3 TIMES DAILY 15 in am 15 lunch 15 pm      . simvastatin (ZOCOR) 20 MG tablet Take 1 tablet (20 mg total) by mouth daily at 6 PM.  30 tablet  1  . traZODone (DESYREL) 100 MG tablet Take 100 mg by mouth at bedtime.       . vitamin B-12 (CYANOCOBALAMIN) 1000 MCG tablet Take 1,000 mcg by mouth daily.        . Vitamin D, Ergocalciferol, (DRISDOL) 50000 UNITS CAPS Take 50,000 Units by mouth every 7 (seven) days. Mondays        Physical Exam BP 130/80  Pulse 84  Resp 18  Ht 5\' 9"  (1.753 m)  Wt 218 lb (98.884 kg)  BMI 32.19 kg/m2  SpO2 96% Gen. 66 year old male in no acute distress Neurologic alert and oriented x3 with no deficits Lungs clear with equal breath sounds bilaterally Cardiac regular rate and rhythm normal S1 and S2 no rub or murmur Sternum stable, sternal incision clean dry and intact Leg incisions well-healed, no peripheral edema  Diagnostic Tests: Chest x-ray 02/08/2012 Shows postoperative changes, ICD in place, no active disease, no effusions.   Impression: 66 year old man now one month out from coronary bypass grafting x4. His early postoperative course was complicated by delirium. Since discharge the hospital he has been doing extremely well without any major issues. He was initially having some orthostatic hypotension which has improved. He said he minimal discomfort is not requiring pain medication.  I cautioned him not to lift anything over 10 pounds for another 2 weeks. He may begin driving a limited basis, appropriate precautions were discussed.   Plan: He'll continue to be followed by [facility] cardiology. Dr. Daleen [name] has been his cardiologist, but the patient thinks he may have been transferred to one of his partners.  I will be happy to see him back at any time in the future if I can be of any further assistance with his care.

## 2012-02-09 ENCOUNTER — Encounter: Payer: Self-pay | Admitting: Internal Medicine

## 2012-02-09 ENCOUNTER — Ambulatory Visit (INDEPENDENT_AMBULATORY_CARE_PROVIDER_SITE_OTHER): Payer: Medicare Other | Admitting: Internal Medicine

## 2012-02-09 VITALS — BP 120/84 | HR 76 | Ht 69.0 in | Wt 215.0 lb

## 2012-02-09 DIAGNOSIS — I428 Other cardiomyopathies: Secondary | ICD-10-CM

## 2012-02-09 DIAGNOSIS — Z4502 Encounter for adjustment and management of automatic implantable cardiac defibrillator: Secondary | ICD-10-CM

## 2012-02-09 DIAGNOSIS — I509 Heart failure, unspecified: Secondary | ICD-10-CM

## 2012-02-09 DIAGNOSIS — I255 Ischemic cardiomyopathy: Secondary | ICD-10-CM | POA: Insufficient documentation

## 2012-02-09 DIAGNOSIS — E785 Hyperlipidemia, unspecified: Secondary | ICD-10-CM | POA: Diagnosis not present

## 2012-02-09 DIAGNOSIS — I10 Essential (primary) hypertension: Secondary | ICD-10-CM

## 2012-02-09 LAB — ICD DEVICE OBSERVATION
AL AMPLITUDE: 2.1 mv
AL IMPEDENCE ICD: 362.5 Ohm
AL THRESHOLD: 1 V
ATRIAL PACING ICD: 0.05 pct
BAMS-0001: 180 {beats}/min
CHARGE TIME: 7.5 s
DEV-0020ICD: NEGATIVE
DEVICE MODEL ICD: 7041246
FVT: 0
HV IMPEDENCE: 30 Ohm
MODE SWITCH EPISODES: 0
PACEART VT: 0
RV LEAD AMPLITUDE: 7.9 mv
RV LEAD IMPEDENCE ICD: 262.5 Ohm
RV LEAD THRESHOLD: 0.75 V
TOT-0006: 20130627000000
TOT-0007: 1
TOT-0008: 0
TOT-0009: 0
TOT-0010: 1
TZAT-0001SLOWVT: 1
TZAT-0004SLOWVT: 8
TZAT-0012SLOWVT: 200 ms
TZAT-0013SLOWVT: 2
TZAT-0018SLOWVT: NEGATIVE
TZAT-0019SLOWVT: 7.5 V
TZAT-0020SLOWVT: 1 ms
TZON-0003SLOWVT: 320 ms
TZON-0004SLOWVT: 30
TZON-0005SLOWVT: 6
TZON-0010SLOWVT: 40 ms
TZST-0001SLOWVT: 2
TZST-0001SLOWVT: 3
TZST-0001SLOWVT: 4
TZST-0003SLOWVT: 30 J
TZST-0003SLOWVT: 36 J
VENTRICULAR PACING ICD: 0.14 pct
VF: 0

## 2012-02-09 NOTE — Assessment & Plan Note (Signed)
His [name] defibrillator is working normally. We'll plan to recheck in several months. 

## 2012-02-09 NOTE — Patient Instructions (Signed)
Your physician wants you to follow-up in: June with Dr Ladona Ridgel in [place] You will receive a reminder letter in the mail two months in advance. If you don't receive a letter, please call our office to schedule the follow-up appointment.   Remote monitoring is used to monitor your Pacemaker of ICD from home. This monitoring reduces the number of office visits required to check your device to one time per year. It allows [place] to keep an eye on the functioning of your device to ensure it is working properly. You are scheduled for a device check from home on 05/15/2012. You may send your transmission at any time that day. If you have a wireless device, the transmission will be sent automatically. After your physician reviews your transmission, you will receive a postcard with your next transmission date.

## 2012-02-09 NOTE — Assessment & Plan Note (Signed)
I've encouraged the patient to reduce his fat intake. He'll continue his current medical therapy.

## 2012-02-09 NOTE — Assessment & Plan Note (Signed)
His blood pressure is well controlled. He is instructed to maintain a low-sodium diet, increase his physical activity, and continue his current medical therapy.
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
## 2012-02-10 DIAGNOSIS — I10 Essential (primary) hypertension: Secondary | ICD-10-CM | POA: Diagnosis not present

## 2012-02-10 DIAGNOSIS — IMO0001 Reserved for inherently not codable concepts without codable children: Secondary | ICD-10-CM | POA: Diagnosis not present

## 2012-02-10 DIAGNOSIS — E559 Vitamin D deficiency, unspecified: Secondary | ICD-10-CM | POA: Diagnosis not present

## 2012-02-10 DIAGNOSIS — E1101 Type 2 diabetes mellitus with hyperosmolarity with coma: Secondary | ICD-10-CM | POA: Diagnosis not present

## 2012-02-10 DIAGNOSIS — E785 Hyperlipidemia, unspecified: Secondary | ICD-10-CM | POA: Diagnosis not present

## 2012-02-11 ENCOUNTER — Ambulatory Visit (INDEPENDENT_AMBULATORY_CARE_PROVIDER_SITE_OTHER): Payer: Medicare Other | Admitting: Cardiovascular Disease

## 2012-02-11 ENCOUNTER — Encounter: Payer: Self-pay | Admitting: Cardiovascular Disease

## 2012-02-11 VITALS — BP 146/81 | HR 82 | Wt 219.0 lb

## 2012-02-11 DIAGNOSIS — E7800 Pure hypercholesterolemia, unspecified: Secondary | ICD-10-CM | POA: Diagnosis not present

## 2012-02-11 DIAGNOSIS — I2510 Atherosclerotic heart disease of native coronary artery without angina pectoris: Secondary | ICD-10-CM | POA: Diagnosis not present

## 2012-02-11 DIAGNOSIS — I10 Essential (primary) hypertension: Secondary | ICD-10-CM | POA: Diagnosis not present

## 2012-02-11 DIAGNOSIS — E785 Hyperlipidemia, unspecified: Secondary | ICD-10-CM | POA: Diagnosis not present

## 2012-02-11 MED ORDER — SIMVASTATIN 20 MG PO TABS: 20.0000 mg | ORAL_TABLET | Freq: Every day | ORAL | Status: DC

## 2012-02-11 NOTE — Progress Notes (Signed)
HPI:  66 year old gentleman presenting for followup of coronary artery disease. He presented with progressive exertional angina last month and was found to have multivessel heavily calcified CAD at cardiac catheterization. He was treated with 4 vessel CABG September 9. Last lipids were 12/02/2011 with a cholesterol of 203, HDL 31, and LDL 131. His triglycerides were 204.  The patient complains of fatigue and shortness of breath. Both of these symptoms are slowly improving since surgery. He has shortness of breath with bending over. He's been walking 10 minutes, 3 times daily. He denies chest pain or pressure. He does have a burning sensation in the chest at times, primarily after he takes his medications. He thinks this is secondary to his reflux disease. He denies leg swelling, orthopnea, or PND. He initially had problems with dizziness with standing, but his blood pressure medicines were reduced and this problem is improving.  Outpatient Encounter Prescriptions as of 02/11/2012  Medication Sig Dispense Refill  . ALPRAZolam (XANAX) 1 MG tablet Take 1 mg by mouth at bedtime. For sleep      . aspirin 81 MG chewable tablet Chew 1 tablet (81 mg total) by mouth daily.  30 tablet  0  . bifidobacterium infantis (ALIGN) capsule Take 1 capsule by mouth daily.  14 capsule  0  . citalopram (CELEXA) 20 MG tablet Take 20 mg by mouth daily.        Marland Kitchen dipyridamole-aspirin (AGGRENOX) 25-200 MG per 12 hr capsule Take 2 capsules by mouth 2 (two) times daily.       Marland Kitchen esomeprazole (NEXIUM) 40 MG capsule Take 40 mg by mouth daily.       . Exenatide (BYETTA 5 MCG PEN [place]) Inject 5 Units into the skin 2 (two) times daily.       . fexofenadine (ALLEGRA) 180 MG tablet Take 180 mg by mouth daily as needed. For allergies      . fluticasone (FLONASE) 50 MCG/ACT nasal spray Place 2 sprays into the nose daily as needed. For allergies      . gabapentin (NEURONTIN) 300 MG capsule Take 600 mg by mouth 2 (two) times daily.       Marland Kitchen  guaiFENesin (MUCINEX) 600 MG 12 hr tablet Take 2 tablets (1,200 mg total) by mouth 2 (two) times daily.      Marland Kitchen Hyoscyamine Sulfate (HYOSYNE PO) Take 0.1875-0.375 mg by mouth 2 (two) times daily. Takes one half to one whole tablet twice daily.      . insulin glargine (LANTUS SOLOSTAR) 100 UNIT/ML injection Inject 60 Units into the skin at bedtime.       Marland Kitchen losartan (COZAAR) 100 MG tablet Take 1 tablet (100 mg total) by mouth daily.  60 tablet  1  . metFORMIN (GLUCOPHAGE) 1000 MG tablet Take 1,000 mg by mouth 2 (two) times daily.      . metoprolol (LOPRESSOR) 100 MG tablet Take 100 mg by mouth 2 (two) times daily.      . nitroGLYCERIN (NITROSTAT) 0.4 MG SL tablet Place 1 tablet (0.4 mg total) under the tongue every 5 (five) minutes as needed. For chest pain  25 tablet  3  . NOVOLOG FLEXPEN 100 UNIT/ML injection Inject 15-18 Units into the skin 3 (three) times daily before meals. Uses according to sliding scale at home. 15 -18 UNITS 3 TIMES DAILY 15 in am 15 lunch 15 pm      . simvastatin (ZOCOR) 20 MG tablet Take 1 tablet (20 mg total) by mouth daily at  6 PM.  30 tablet  1  . traZODone (DESYREL) 100 MG tablet Take 100 mg by mouth at bedtime.       . vitamin B-12 (CYANOCOBALAMIN) 1000 MCG tablet Take 1,000 mcg by mouth daily.        . Vitamin D, Ergocalciferol, (DRISDOL) 50000 UNITS CAPS Take 50,000 Units by mouth every 7 (seven) days. Mondays        Allergies  Allergen Reactions  . Penicillins Hives  . Morphine And Related Other (See Comments)    hallucinations  . Other     Adhesive tape  . Percocet (Oxycodone-Acetaminophen) Other (See Comments)    hallucinations    Past Medical History  Diagnosis Date  . ASCVD (arteriosclerotic cardiovascular disease)     70% mid left anterior descending lesion on cath in 06/1995; left anterior desending DES placed in 8/03 and RCA stent in 9/03; captain 3/05 revealed 90% second marginal for which PCI was performed, 70% PDA and a total obstruction of the  first diagonal and marginal; sudden cardiac death in [place] in 2003/10/27 for which automatic implantable cardiac defibrillator placed; negativ stress nuclear 10/07  . Hypertension   . Hyperlipidemia   . Tobacco abuse     100 pack/year comsuption; cigarettes discontinued 2003; all tobacco products in 2008  . CVD (cerebrovascular disease) 05/2008    Transient ischemic attack; carotid ultrasound-plaque without focal disease  . Diabetes mellitus     Insulin requirement  . Degenerative joint disease 2002    C-spine fusion   . Erectile dysfunction   . Anxiety and depression   . Benign prostatic hypertrophy   . Other testicular hypofunction   . Esophageal reflux   . Diabetes mellitus without mention of complication   . Coronary artery disease   . Allergic rhinitis, cause unspecified   . S/P endoscopy Dec 2011    RMR: nl esophagus, hyperplastic polyp, active gastritis, no H.pylori.   . ICD (implantable cardiac defibrillator) in place   . Pacemaker   . Hx-TIA (transient ischemic attack) 2010    ROS: Negative except as per HPI  BP 146/81  Pulse 82  Wt 99.338 kg (219 lb)  PHYSICAL EXAM: Pt is alert and oriented, NAD HEENT: normal Neck: JVP - normal, carotids 2+= without bruits Chest: Sternotomy scar is well healed Lungs: CTA bilaterally CV: RRR without murmur or gallop Abd: soft, NT, Positive BS, no hepatomegaly Ext: no C/C/E, distal pulses intact and equal Skin: warm/dry no rash  EKG:  Normal sinus rhythm with PVCs. Left axis deviation. Nonspecific T wave abnormality.  ASSESSMENT AND PLAN: 1. CAD status post CABG. The patient is progressing well, now 1 month out from surgery. He was encouraged to continue with this daily walking. His medications were reviewed and he is on a good program that includes aspirin, beta blocker, ARB, and statin drug. He will return in 2 months for followup.  2. Hyperlipidemia. We discussed his lipid panel from August. At that time he was not on a  statin drug. He has been placed on simvastatin 20 mg and this was refilled for him today. We will repeat his lipids in 6 weeks and see him back in 8 weeks for followup.  3. Hypertension. Blood pressure control is borderline, but he is still having some postural dizziness. Will not make any changes to his blood pressure regimen today and we'll reassess in 2 months when I see him back.  [name] 02/11/2012 9:38 AM

## 2012-02-11 NOTE — Patient Instructions (Addendum)
Your physician recommends that you continue on your current medications as directed. Please refer to the Current Medication list given to you today.  Your physician recommends that you return in 6 WEEKS for a FASTING LIPID, LIVER and BMP--nothing to eat or drink after midnight, lab opens at 8:30.  Your physician recommends that you schedule a follow-up appointment in: 8 WEEKS with Dr Excell Seltzer

## 2012-02-15 DIAGNOSIS — M542 Cervicalgia: Secondary | ICD-10-CM | POA: Diagnosis not present

## 2012-02-16 DIAGNOSIS — Z79899 Other long term (current) drug therapy: Secondary | ICD-10-CM | POA: Diagnosis not present

## 2012-02-16 DIAGNOSIS — M542 Cervicalgia: Secondary | ICD-10-CM | POA: Diagnosis not present

## 2012-02-17 DIAGNOSIS — E559 Vitamin D deficiency, unspecified: Secondary | ICD-10-CM | POA: Diagnosis not present

## 2012-02-17 DIAGNOSIS — Z87891 Personal history of nicotine dependence: Secondary | ICD-10-CM | POA: Diagnosis not present

## 2012-02-17 DIAGNOSIS — I2510 Atherosclerotic heart disease of native coronary artery without angina pectoris: Secondary | ICD-10-CM | POA: Diagnosis not present

## 2012-02-17 DIAGNOSIS — IMO0001 Reserved for inherently not codable concepts without codable children: Secondary | ICD-10-CM | POA: Diagnosis not present

## 2012-02-17 DIAGNOSIS — E669 Obesity, unspecified: Secondary | ICD-10-CM | POA: Diagnosis not present

## 2012-02-17 DIAGNOSIS — E785 Hyperlipidemia, unspecified: Secondary | ICD-10-CM | POA: Diagnosis not present

## 2012-02-21 DIAGNOSIS — J019 Acute sinusitis, unspecified: Secondary | ICD-10-CM | POA: Diagnosis not present

## 2012-03-07 ENCOUNTER — Encounter (HOSPITAL_COMMUNITY): Payer: Medicare Other

## 2012-03-07 DIAGNOSIS — IMO0002 Reserved for concepts with insufficient information to code with codable children: Secondary | ICD-10-CM | POA: Diagnosis not present

## 2012-03-07 DIAGNOSIS — M1710 Unilateral primary osteoarthritis, unspecified knee: Secondary | ICD-10-CM | POA: Diagnosis not present

## 2012-03-09 ENCOUNTER — Encounter (HOSPITAL_COMMUNITY): Payer: Self-pay

## 2012-03-09 ENCOUNTER — Encounter (HOSPITAL_COMMUNITY)
Admission: RE | Admit: 2012-03-09 | Discharge: 2012-03-09 | Disposition: A | Discharge status: Home / Self Care | Payer: Medicare Other | Source: Ambulatory Visit | Attending: Cardiovascular Disease | Admitting: Cardiovascular Disease

## 2012-03-09 VITALS — BP 122/80 | HR 77 | Ht 69.0 in | Wt 213.2 lb

## 2012-03-09 DIAGNOSIS — Z5189 Encounter for other specified aftercare: Secondary | ICD-10-CM | POA: Insufficient documentation

## 2012-03-09 DIAGNOSIS — Z951 Presence of aortocoronary bypass graft: Secondary | ICD-10-CM | POA: Insufficient documentation

## 2012-03-09 DIAGNOSIS — I2510 Atherosclerotic heart disease of native coronary artery without angina pectoris: Secondary | ICD-10-CM

## 2012-03-09 NOTE — Progress Notes (Signed)
Patient was referred by Dr. [name] for Coronary Artery Bypass Graft 414.01. During orientation advised patient on arrival and appointment times what to wear, what to do before, during and after exercise. Reviewed attendance and class policy. Talked about inclement weather and class consultation policy. Pt is scheduled to start Cardiac Rehab on 03/13/12 at 8:15 am. Pt was advised to come to class 15 minutes before class starts. He was also given instructions on meeting with the dietician and attending the Family Structure classes. Pt is eager to get started.

## 2012-03-09 NOTE — Patient Instructions (Signed)
Pt has finished orientation and is scheduled to start CR on 03/13/12 at 8:15. Pt has been instructed to arrive to class 15 minutes early for scheduled class. Pt has been instructed to wear comfortable clothing and shoes with rubber soles. Pt has been told to take their medications 1 hour prior to coming to class.  If the patient is not going to attend class, he/she has been instructed to call.

## 2012-03-13 ENCOUNTER — Encounter (HOSPITAL_COMMUNITY)
Admission: RE | Admit: 2012-03-13 | Discharge: 2012-03-13 | Disposition: A | Discharge status: Home / Self Care | Payer: Medicare Other | Source: Ambulatory Visit | Attending: Cardiovascular Disease | Admitting: Cardiovascular Disease

## 2012-03-13 DIAGNOSIS — Z951 Presence of aortocoronary bypass graft: Secondary | ICD-10-CM | POA: Diagnosis not present

## 2012-03-13 DIAGNOSIS — I2510 Atherosclerotic heart disease of native coronary artery without angina pectoris: Secondary | ICD-10-CM | POA: Diagnosis not present

## 2012-03-13 DIAGNOSIS — Z5189 Encounter for other specified aftercare: Secondary | ICD-10-CM | POA: Diagnosis not present

## 2012-03-15 ENCOUNTER — Encounter (HOSPITAL_COMMUNITY)
Admission: RE | Admit: 2012-03-15 | Discharge: 2012-03-15 | Disposition: A | Discharge status: Home / Self Care | Payer: Medicare Other | Source: Ambulatory Visit | Attending: Cardiovascular Disease | Admitting: Cardiovascular Disease

## 2012-03-17 ENCOUNTER — Encounter (HOSPITAL_COMMUNITY)
Admission: RE | Admit: 2012-03-17 | Discharge: 2012-03-17 | Disposition: A | Discharge status: Home / Self Care | Payer: Medicare Other | Source: Ambulatory Visit | Attending: Cardiovascular Disease | Admitting: Cardiovascular Disease

## 2012-03-20 ENCOUNTER — Encounter (HOSPITAL_COMMUNITY): Payer: Medicare Other

## 2012-03-20 ENCOUNTER — Other Ambulatory Visit: Payer: Medicare Other

## 2012-03-20 DIAGNOSIS — J019 Acute sinusitis, unspecified: Secondary | ICD-10-CM | POA: Diagnosis not present

## 2012-03-22 ENCOUNTER — Encounter (HOSPITAL_COMMUNITY): Payer: Medicare Other

## 2012-03-23 ENCOUNTER — Other Ambulatory Visit: Payer: Self-pay | Admitting: Gastroenterology

## 2012-03-24 ENCOUNTER — Encounter (HOSPITAL_COMMUNITY): Payer: Medicare Other

## 2012-03-24 DIAGNOSIS — J42 Unspecified chronic bronchitis: Secondary | ICD-10-CM | POA: Diagnosis not present

## 2012-03-27 ENCOUNTER — Encounter (HOSPITAL_COMMUNITY): Payer: Medicare Other

## 2012-03-29 ENCOUNTER — Encounter (HOSPITAL_COMMUNITY): Payer: Medicare Other

## 2012-03-31 ENCOUNTER — Encounter (HOSPITAL_COMMUNITY): Payer: Medicare Other

## 2012-04-03 ENCOUNTER — Encounter: Payer: Self-pay | Admitting: Cardiovascular Disease

## 2012-04-03 ENCOUNTER — Encounter (INDEPENDENT_AMBULATORY_CARE_PROVIDER_SITE_OTHER): Payer: Medicare Other | Admitting: Cardiovascular Disease

## 2012-04-03 ENCOUNTER — Encounter (HOSPITAL_COMMUNITY): Payer: Medicare Other

## 2012-04-03 DIAGNOSIS — I10 Essential (primary) hypertension: Secondary | ICD-10-CM

## 2012-04-05 ENCOUNTER — Encounter (HOSPITAL_COMMUNITY): Payer: Medicare Other

## 2012-04-07 ENCOUNTER — Encounter (HOSPITAL_COMMUNITY): Payer: Medicare Other

## 2012-04-10 ENCOUNTER — Encounter (HOSPITAL_COMMUNITY)
Admission: RE | Admit: 2012-04-10 | Discharge: 2012-04-10 | Disposition: A | Discharge status: Home / Self Care | Payer: Medicare Other | Source: Ambulatory Visit | Attending: Cardiovascular Disease | Admitting: Cardiovascular Disease

## 2012-04-10 DIAGNOSIS — Z951 Presence of aortocoronary bypass graft: Secondary | ICD-10-CM | POA: Insufficient documentation

## 2012-04-10 DIAGNOSIS — I2510 Atherosclerotic heart disease of native coronary artery without angina pectoris: Secondary | ICD-10-CM | POA: Insufficient documentation

## 2012-04-10 DIAGNOSIS — Z5189 Encounter for other specified aftercare: Secondary | ICD-10-CM | POA: Insufficient documentation

## 2012-04-12 ENCOUNTER — Encounter (HOSPITAL_COMMUNITY)
Admission: RE | Admit: 2012-04-12 | Discharge: 2012-04-12 | Disposition: A | Discharge status: Home / Self Care | Payer: Medicare Other | Source: Ambulatory Visit | Attending: Cardiovascular Disease | Admitting: Cardiovascular Disease

## 2012-04-12 NOTE — Progress Notes (Signed)
Patient ID: Lance [name], male   DOB: 08-16-1945, 66 y.o.   MRN: [id number]

## 2012-04-13 DIAGNOSIS — H903 Sensorineural hearing loss, bilateral: Secondary | ICD-10-CM | POA: Diagnosis not present

## 2012-04-14 ENCOUNTER — Encounter (HOSPITAL_COMMUNITY)
Admission: RE | Admit: 2012-04-14 | Discharge: 2012-04-14 | Disposition: A | Discharge status: Home / Self Care | Payer: Medicare Other | Source: Ambulatory Visit | Attending: Cardiovascular Disease | Admitting: Cardiovascular Disease

## 2012-04-17 ENCOUNTER — Encounter (HOSPITAL_COMMUNITY)
Admission: RE | Admit: 2012-04-17 | Discharge: 2012-04-17 | Disposition: A | Discharge status: Home / Self Care | Payer: Medicare Other | Source: Ambulatory Visit | Attending: Cardiovascular Disease | Admitting: Cardiovascular Disease

## 2012-04-18 DIAGNOSIS — E1149 Type 2 diabetes mellitus with other diabetic neurological complication: Secondary | ICD-10-CM | POA: Diagnosis not present

## 2012-04-19 ENCOUNTER — Encounter (HOSPITAL_COMMUNITY)
Admission: RE | Admit: 2012-04-19 | Discharge: 2012-04-19 | Disposition: A | Discharge status: Home / Self Care | Payer: Medicare Other | Source: Ambulatory Visit | Attending: Cardiovascular Disease | Admitting: Cardiovascular Disease

## 2012-04-20 DIAGNOSIS — R42 Dizziness and giddiness: Secondary | ICD-10-CM | POA: Diagnosis not present

## 2012-04-21 ENCOUNTER — Other Ambulatory Visit: Payer: Self-pay | Admitting: Neurology

## 2012-04-21 ENCOUNTER — Encounter (HOSPITAL_COMMUNITY): Payer: Medicare Other

## 2012-04-21 DIAGNOSIS — R42 Dizziness and giddiness: Secondary | ICD-10-CM

## 2012-04-24 ENCOUNTER — Encounter (HOSPITAL_COMMUNITY)
Admission: RE | Admit: 2012-04-24 | Discharge: 2012-04-24 | Disposition: A | Discharge status: Home / Self Care | Payer: Medicare Other | Source: Ambulatory Visit | Attending: Cardiovascular Disease | Admitting: Cardiovascular Disease

## 2012-04-24 DIAGNOSIS — R5383 Other fatigue: Secondary | ICD-10-CM | POA: Diagnosis not present

## 2012-04-24 DIAGNOSIS — R5381 Other malaise: Secondary | ICD-10-CM | POA: Diagnosis not present

## 2012-04-24 DIAGNOSIS — D649 Anemia, unspecified: Secondary | ICD-10-CM | POA: Diagnosis not present

## 2012-04-27 ENCOUNTER — Ambulatory Visit
Admission: RE | Admit: 2012-04-27 | Discharge: 2012-04-27 | Disposition: A | Discharge status: Home / Self Care | Payer: Medicare Other | Source: Ambulatory Visit | Attending: Neurology | Admitting: Neurology

## 2012-04-27 DIAGNOSIS — R42 Dizziness and giddiness: Secondary | ICD-10-CM

## 2012-04-28 ENCOUNTER — Encounter (HOSPITAL_COMMUNITY): Payer: Medicare Other

## 2012-05-01 ENCOUNTER — Encounter (HOSPITAL_COMMUNITY)
Admission: RE | Admit: 2012-05-01 | Discharge: 2012-05-01 | Disposition: A | Discharge status: Home / Self Care | Payer: Medicare Other | Source: Ambulatory Visit | Attending: Cardiovascular Disease | Admitting: Cardiovascular Disease

## 2012-05-01 ENCOUNTER — Encounter: Payer: Self-pay | Admitting: *Deleted

## 2012-05-03 ENCOUNTER — Encounter (HOSPITAL_COMMUNITY): Payer: Medicare Other

## 2012-05-03 DIAGNOSIS — I639 Cerebral infarction, unspecified: Secondary | ICD-10-CM

## 2012-05-03 HISTORY — DX: Cerebral infarction, unspecified: I63.9

## 2012-05-04 DIAGNOSIS — H6980 Other specified disorders of Eustachian tube, unspecified ear: Secondary | ICD-10-CM | POA: Diagnosis not present

## 2012-05-04 DIAGNOSIS — H6520 Chronic serous otitis media, unspecified ear: Secondary | ICD-10-CM | POA: Diagnosis not present

## 2012-05-04 DIAGNOSIS — H9110 Presbycusis, unspecified ear: Secondary | ICD-10-CM | POA: Diagnosis not present

## 2012-05-05 ENCOUNTER — Encounter (HOSPITAL_COMMUNITY)
Admission: RE | Admit: 2012-05-05 | Discharge: 2012-05-05 | Disposition: A | Discharge status: Home / Self Care | Payer: Medicare Other | Source: Ambulatory Visit | Attending: Cardiovascular Disease | Admitting: Cardiovascular Disease

## 2012-05-05 DIAGNOSIS — I2510 Atherosclerotic heart disease of native coronary artery without angina pectoris: Secondary | ICD-10-CM | POA: Diagnosis not present

## 2012-05-05 DIAGNOSIS — Z5189 Encounter for other specified aftercare: Secondary | ICD-10-CM | POA: Insufficient documentation

## 2012-05-05 DIAGNOSIS — Z951 Presence of aortocoronary bypass graft: Secondary | ICD-10-CM | POA: Diagnosis not present

## 2012-05-08 ENCOUNTER — Encounter (HOSPITAL_COMMUNITY)
Admission: RE | Admit: 2012-05-08 | Discharge: 2012-05-08 | Disposition: A | Discharge status: Home / Self Care | Payer: Medicare Other | Source: Ambulatory Visit | Attending: Cardiovascular Disease | Admitting: Cardiovascular Disease

## 2012-05-10 ENCOUNTER — Encounter (HOSPITAL_COMMUNITY): Payer: Medicare Other

## 2012-05-12 ENCOUNTER — Encounter (HOSPITAL_COMMUNITY): Payer: Medicare Other

## 2012-05-12 DIAGNOSIS — Z87891 Personal history of nicotine dependence: Secondary | ICD-10-CM | POA: Diagnosis not present

## 2012-05-12 DIAGNOSIS — E785 Hyperlipidemia, unspecified: Secondary | ICD-10-CM | POA: Diagnosis not present

## 2012-05-12 DIAGNOSIS — IMO0001 Reserved for inherently not codable concepts without codable children: Secondary | ICD-10-CM | POA: Diagnosis not present

## 2012-05-12 DIAGNOSIS — I259 Chronic ischemic heart disease, unspecified: Secondary | ICD-10-CM | POA: Diagnosis not present

## 2012-05-12 DIAGNOSIS — D729 Disorder of white blood cells, unspecified: Secondary | ICD-10-CM | POA: Diagnosis not present

## 2012-05-15 ENCOUNTER — Encounter (HOSPITAL_COMMUNITY): Payer: Medicare Other

## 2012-05-15 ENCOUNTER — Encounter: Payer: Medicare Other | Admitting: *Deleted

## 2012-05-16 ENCOUNTER — Other Ambulatory Visit: Payer: Self-pay | Admitting: Physician Assistant

## 2012-05-16 ENCOUNTER — Encounter: Payer: Self-pay | Admitting: *Deleted

## 2012-05-17 ENCOUNTER — Encounter (HOSPITAL_COMMUNITY): Payer: Medicare Other

## 2012-05-18 DIAGNOSIS — R5383 Other fatigue: Secondary | ICD-10-CM | POA: Diagnosis not present

## 2012-05-18 DIAGNOSIS — R5381 Other malaise: Secondary | ICD-10-CM | POA: Diagnosis not present

## 2012-05-19 ENCOUNTER — Encounter (HOSPITAL_COMMUNITY): Payer: Medicare Other

## 2012-05-19 DIAGNOSIS — IMO0001 Reserved for inherently not codable concepts without codable children: Secondary | ICD-10-CM | POA: Diagnosis not present

## 2012-05-19 DIAGNOSIS — E785 Hyperlipidemia, unspecified: Secondary | ICD-10-CM | POA: Diagnosis not present

## 2012-05-19 DIAGNOSIS — E559 Vitamin D deficiency, unspecified: Secondary | ICD-10-CM | POA: Diagnosis not present

## 2012-05-22 ENCOUNTER — Encounter (HOSPITAL_COMMUNITY): Payer: Medicare Other

## 2012-05-22 DIAGNOSIS — M542 Cervicalgia: Secondary | ICD-10-CM | POA: Diagnosis not present

## 2012-05-24 ENCOUNTER — Encounter (HOSPITAL_COMMUNITY): Payer: Medicare Other

## 2012-05-26 ENCOUNTER — Encounter (HOSPITAL_COMMUNITY): Payer: Medicare Other

## 2012-05-26 ENCOUNTER — Ambulatory Visit (HOSPITAL_COMMUNITY)
Admission: RE | Admit: 2012-05-26 | Discharge: 2012-05-26 | Disposition: A | Discharge status: Home / Self Care | Payer: Medicare Other | Source: Ambulatory Visit | Attending: Family Medicine | Admitting: Family Medicine

## 2012-05-26 ENCOUNTER — Other Ambulatory Visit: Payer: Self-pay | Admitting: Family Medicine

## 2012-05-26 DIAGNOSIS — R05 Cough: Secondary | ICD-10-CM

## 2012-05-26 DIAGNOSIS — R059 Cough, unspecified: Secondary | ICD-10-CM | POA: Insufficient documentation

## 2012-05-26 DIAGNOSIS — R509 Fever, unspecified: Secondary | ICD-10-CM | POA: Diagnosis not present

## 2012-05-26 DIAGNOSIS — R5381 Other malaise: Secondary | ICD-10-CM | POA: Diagnosis not present

## 2012-05-26 DIAGNOSIS — J9819 Other pulmonary collapse: Secondary | ICD-10-CM | POA: Diagnosis not present

## 2012-05-26 DIAGNOSIS — J988 Other specified respiratory disorders: Secondary | ICD-10-CM | POA: Diagnosis not present

## 2012-05-26 DIAGNOSIS — J189 Pneumonia, unspecified organism: Secondary | ICD-10-CM | POA: Diagnosis not present

## 2012-05-29 ENCOUNTER — Encounter (HOSPITAL_COMMUNITY): Payer: Medicare Other

## 2012-05-29 DIAGNOSIS — R42 Dizziness and giddiness: Secondary | ICD-10-CM | POA: Diagnosis not present

## 2012-05-29 DIAGNOSIS — J209 Acute bronchitis, unspecified: Secondary | ICD-10-CM | POA: Diagnosis not present

## 2012-05-31 ENCOUNTER — Encounter (HOSPITAL_COMMUNITY): Payer: Medicare Other

## 2012-05-31 ENCOUNTER — Other Ambulatory Visit: Payer: Self-pay | Admitting: Family Medicine

## 2012-05-31 ENCOUNTER — Ambulatory Visit (HOSPITAL_COMMUNITY)
Admission: RE | Admit: 2012-05-31 | Discharge: 2012-05-31 | Disposition: A | Discharge status: Home / Self Care | Payer: Medicare Other | Source: Ambulatory Visit | Attending: Family Medicine | Admitting: Family Medicine

## 2012-05-31 DIAGNOSIS — R791 Abnormal coagulation profile: Secondary | ICD-10-CM | POA: Diagnosis not present

## 2012-05-31 DIAGNOSIS — R918 Other nonspecific abnormal finding of lung field: Secondary | ICD-10-CM | POA: Insufficient documentation

## 2012-05-31 DIAGNOSIS — R7989 Other specified abnormal findings of blood chemistry: Secondary | ICD-10-CM

## 2012-05-31 DIAGNOSIS — R0989 Other specified symptoms and signs involving the circulatory and respiratory systems: Secondary | ICD-10-CM | POA: Diagnosis not present

## 2012-05-31 DIAGNOSIS — J984 Other disorders of lung: Secondary | ICD-10-CM | POA: Diagnosis not present

## 2012-05-31 DIAGNOSIS — R0609 Other forms of dyspnea: Secondary | ICD-10-CM | POA: Diagnosis not present

## 2012-05-31 LAB — POCT I-STAT, CHEM 8
BUN: 15 mg/dL (ref 6–23)
Calcium, Ion: 1.29 mmol/L (ref 1.13–1.30)
Chloride: 104 mEq/L (ref 96–112)
Creatinine, Ser: 1 mg/dL (ref 0.50–1.35)
Glucose, Bld: 204 mg/dL — ABNORMAL HIGH (ref 70–99)
HCT: 41 % (ref 39.0–52.0)
Hemoglobin: 13.9 g/dL (ref 13.0–17.0)
Potassium: 4 mEq/L (ref 3.5–5.1)
Sodium: 137 mEq/L (ref 135–145)
TCO2: 26 mmol/L (ref 0–100)

## 2012-05-31 MED ORDER — IOHEXOL 350 MG/ML SOLN
100.0000 mL | Freq: Once | INTRAVENOUS | Status: AC | PRN
  Administered 2012-05-31: 100 mL via INTRAVENOUS

## 2012-05-31 NOTE — Progress Notes (Signed)
Blood sample obtained from left arm IV for Creatnine level.  

## 2012-05-31 NOTE — Progress Notes (Signed)
Cardiac Rehabilitation Program Outcomes Report   Orientation:  03/19/2012 !st week Report: 03/19/2012 Graduate Date:  tbd Discharge Date:  tbd # of sessions completed: 3 DX: CABG X 5  Cardiologist: Excell Seltzer Family MD:  Lilyan Punt Class Time:  08:15  A.  Exercise Program:  Tolerates exercise @ 2.60 METS for 15 minutes and Walk Test Results:  Pre: Pre 6 minute walk test: Pre HR 74, Pre BP 120/80, O2 99%, RPE 7, and RPD 8, 6 minute HR 84 , BP 152/100, O2 96% , RPE 9, and RPD 11, Post  BP was 150/80. Walked 1000 ft,  Pt did not take medication.  B.  Mental Health:  Good mental attitude  C.  Education/Instruction/Skills  Accurately checks own pulse.  Rest:  81  Exercise: 96, Knows THR for exercise and Uses Perceived Exertion Scale and/or Dyspnea Scale  Uses Perceived Exertion Scale and/or Dyspnea Scale  D.  Nutrition/Weight Control/Body Composition:  Adherence to prescribed nutrition program: good    E.  Blood Lipids    Lab Results  Component Value Date   CHOL 203* 12/02/2011   HDL 31* 12/02/2011   LDLCALC 131* 12/02/2011   TRIG 204* 12/02/2011   CHOLHDL 6.5 12/02/2011    F.  Lifestyle Changes:  Making positive lifestyle changes  G.  Symptoms noted with exercise:  Asymptomatic  Report Completed By:  Lelon Huh. Namari Breton RN   Comments:  This is patients 1st week report. He achieved a peak METS of 2.60. His resting HR was 81, and BP 148/78, His peak HR is 96 and peak BP was 142/78.  A report will follow upon his halfway point on his 18th visit.

## 2012-05-31 NOTE — Progress Notes (Signed)
Cardiac Rehabilitation Program Outcomes Report   Orientation:  11/7/201 Graduate Date:  N/A Discharge Date:  05/08/2012 # of sessions completed: 12 DX: CABG X 5  Cardiologist: [name] Family MD:  Lilyan Punt Class Time:  08:15  A.  Exercise Program:  Tolerates exercise @ 2.60 METS for 15 minutes and Discharged  B.  Mental Health:  Good mental attitude  C.  Education/Instruction/Skills  Accurately checks own pulse.  Rest:  81  Exercise:  96, Knows THR for exercise, Uses Perceived Exertion Scale and/or Dyspnea Scale and Attended 6 education classes  Uses Perceived Exertion Scale and/or Dyspnea Scale  D.  Nutrition/Weight Control/Body Composition:  Adherence to prescribed nutrition program: good    E.  Blood Lipids    Lab Results  Component Value Date   CHOL 203* 12/02/2011   HDL 31* 12/02/2011   LDLCALC 131* 12/02/2011   TRIG 204* 12/02/2011   CHOLHDL 6.5 12/02/2011    F.  Lifestyle Changes:  Making positive lifestyle changes  G.  Symptoms noted with exercise:  Asymptomatic  Report Completed By:  Lelon Huh. Mateusz Neilan RN   Comments:  This is patients last report. He attended only 12 visits. Did not return to rehab. His resting HR was 81 and resting BP was 148/78 and His peak HR was 96 and peak BP was 142/78 on his 12th visit. He never returned to rehab.

## 2012-06-02 ENCOUNTER — Encounter: Payer: Self-pay | Admitting: Internal Medicine

## 2012-06-02 ENCOUNTER — Ambulatory Visit (INDEPENDENT_AMBULATORY_CARE_PROVIDER_SITE_OTHER): Payer: Medicare Other | Admitting: Internal Medicine

## 2012-06-02 ENCOUNTER — Encounter (HOSPITAL_COMMUNITY): Payer: Medicare Other

## 2012-06-02 VITALS — BP 130/78 | HR 85 | Temp 98.1°F | Ht 69.0 in | Wt 223.0 lb

## 2012-06-02 DIAGNOSIS — K219 Gastro-esophageal reflux disease without esophagitis: Secondary | ICD-10-CM

## 2012-06-02 DIAGNOSIS — R1310 Dysphagia, unspecified: Secondary | ICD-10-CM
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
## 2012-06-02 NOTE — Patient Instructions (Signed)
Schedule EGD with possible dilation in the near future

## 2012-06-05 ENCOUNTER — Encounter (HOSPITAL_COMMUNITY): Payer: Self-pay | Admitting: Pharmacy Technician

## 2012-06-06 MED ORDER — SODIUM CHLORIDE 0.45 % IV SOLN
INTRAVENOUS | Status: DC
  Administered 2012-06-07: 10:00:00 via INTRAVENOUS

## 2012-06-06 NOTE — Addendum Note (Signed)
Encounter addended by: Rolene Course on: 06/06/2012  4:07 PM<BR>     Documentation filed: Visit Diagnoses, Flowsheet VN

## 2012-06-06 NOTE — Progress Notes (Signed)
Patient stopped coming to Cardiac Rehabilitation today on 05/08/12. He achieved LTG of 30 minutes of aerobic exercise at Max Met level of 2.6. All patients vitals are WNL. Patient has met with dietician. No discharge instruction has been reviewed. Patient did not return our calls to explain why he was not coming back to CR.

## 2012-06-07 ENCOUNTER — Ambulatory Visit (HOSPITAL_COMMUNITY)
Admission: RE | Admit: 2012-06-07 | Discharge: 2012-06-07 | Disposition: A | Discharge status: Home / Self Care | Payer: Medicare Other | Source: Ambulatory Visit | Attending: Internal Medicine | Admitting: Internal Medicine

## 2012-06-07 ENCOUNTER — Encounter (HOSPITAL_COMMUNITY)
Admission: RE | Disposition: A | Discharge status: Home / Self Care | Payer: Self-pay | Source: Ambulatory Visit | Attending: Internal Medicine

## 2012-06-07 ENCOUNTER — Encounter (HOSPITAL_COMMUNITY): Payer: Self-pay

## 2012-06-07 DIAGNOSIS — I10 Essential (primary) hypertension: Secondary | ICD-10-CM | POA: Insufficient documentation

## 2012-06-07 DIAGNOSIS — D131 Benign neoplasm of stomach: Secondary | ICD-10-CM | POA: Insufficient documentation

## 2012-06-07 DIAGNOSIS — R1310 Dysphagia, unspecified: Secondary | ICD-10-CM | POA: Insufficient documentation

## 2012-06-07 DIAGNOSIS — Z01812 Encounter for preprocedural laboratory examination: Secondary | ICD-10-CM | POA: Insufficient documentation

## 2012-06-07 DIAGNOSIS — E119 Type 2 diabetes mellitus without complications: Secondary | ICD-10-CM | POA: Diagnosis not present

## 2012-06-07 DIAGNOSIS — K219 Gastro-esophageal reflux disease without esophagitis: Secondary | ICD-10-CM

## 2012-06-07 HISTORY — PX: ESOPHAGOGASTRODUODENOSCOPY (EGD) WITH ESOPHAGEAL DILATION: SHX5812

## 2012-06-07 LAB — GLUCOSE, CAPILLARY: Glucose-Capillary: 171 mg/dL — ABNORMAL HIGH (ref 70–99)

## 2012-06-07 SURGERY — ESOPHAGOGASTRODUODENOSCOPY (EGD) WITH ESOPHAGEAL DILATION
Anesthesia: Moderate Sedation

## 2012-06-07 MED ORDER — MEPERIDINE HCL 100 MG/ML IJ SOLN
INTRAMUSCULAR | Status: DC | PRN
  Administered 2012-06-07: 50 mg via INTRAVENOUS
  Administered 2012-06-07: 25 mg via INTRAVENOUS

## 2012-06-07 MED ORDER — MIDAZOLAM HCL 5 MG/5ML IJ SOLN
INTRAMUSCULAR | Status: AC
  Filled 2012-06-07: qty 10

## 2012-06-07 MED ORDER — MEPERIDINE HCL 100 MG/ML IJ SOLN
INTRAMUSCULAR | Status: AC
  Filled 2012-06-07: qty 1

## 2012-06-07 MED ORDER — BUTAMBEN-TETRACAINE-BENZOCAINE 2-2-14 % EX AERO
INHALATION_SPRAY | CUTANEOUS | Status: DC | PRN
  Administered 2012-06-07: 2 via TOPICAL

## 2012-06-07 MED ORDER — STERILE WATER FOR IRRIGATION IR SOLN
Status: DC | PRN
  Administered 2012-06-07: 10:00:00

## 2012-06-07 MED ORDER — MIDAZOLAM HCL 5 MG/5ML IJ SOLN
INTRAMUSCULAR | Status: DC | PRN
  Administered 2012-06-07 (×2): 2 mg via INTRAVENOUS
  Administered 2012-06-07: 1 mg via INTRAVENOUS

## 2012-06-07 MED ORDER — ONDANSETRON HCL 4 MG/2ML IJ SOLN
INTRAMUSCULAR | Status: DC | PRN
  Administered 2012-06-07: 4 mg via INTRAVENOUS

## 2012-06-07 MED ORDER — ONDANSETRON HCL 4 MG/2ML IJ SOLN
INTRAMUSCULAR | Status: AC
  Filled 2012-06-07: qty 2

## 2012-06-07 NOTE — Op Note (Signed)
[facility] [address] [place], [address]   ENDOSCOPY PROCEDURE REPORT  PATIENT: Lance, [name]  MR#: [id number] BIRTHDATE: [birth year] , 66  yrs. old GENDER: Male ENDOSCOPIST: R.  Roetta Sessions, MD FACP FACG REFERRED BY:  Flo Shanks, M.D.  Lilyan Punt, M.D. PROCEDURE DATE:  06/07/2012 PROCEDURE:     EGD with Elease Hashimoto dilation followed by gastric polyp biopsy  INDICATIONS:     Long-standing GERD; esophageal dysphagia; intermittent hoarseness  INFORMED CONSENT:   The risks, benefits, limitations, alternatives and imponderables have been discussed.  The potential for biopsy, esophogeal dilation, etc. have also been reviewed.  Questions have been answered.  All parties agreeable.  Please see the history and physical in the medical record for more information.  MEDICATIONS:     Versed 5 mg IV and Demerol 75 mg IV in divided doses. Cetacaine spray. Zofran 4 mg IV  DESCRIPTION OF PROCEDURE:   The AO-1308M ([id number])  endoscope was introduced through the mouth and advanced to the second portion of the duodenum without difficulty or limitations.  The mucosal surfaces were surveyed very carefully during advancement of the scope and upon withdrawal.  Retroflexion view of the proximal stomach and esophagogastric junction was performed.      FINDINGS: Normal appearing tubular esophagus. Undulating Z line. No esophagitis. Tubular esophagus wildly patent throughout its course. Minimal amount of retained gastric contents easily washed and suctioned out. (1) 4 mm benign-appearing gastric polyp in the antrum; otherwise, the remainder of the gastric mucosa appeared normal. Patent pylorus. Normal-appearing first and second portion of the duodenum.  THERAPEUTIC / DIAGNOSTIC MANEUVERS PERFORMED:  A 56 French Maloney dilator was passed to full insertion easily. A look back revealed no apparent complication related to this maneuver. Subsequently, biopsy of the solitary  gastric polyp taken for histologic study   COMPLICATIONS:  None  IMPRESSION:    Normal esophagus-status post passage of a Maloney dilator. Gastric polyp status post biopsy  RECOMMENDATIONS: Continue Nexium 40 mg orally twice daily. Follow through on seeing Dr. Lazarus Salines regarding evaluation of hoarseness.    _______________________________ R. Roetta Sessions, MD FACP [facility] eSigned:  R. Roetta Sessions, MD FACP [facility] 06/07/2012 10:52 AM     CC:  PATIENT NAME:  Lance, [name] MR#: [id number]
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
## 2012-06-07 NOTE — Interval H&P Note (Signed)
History and Physical Interval Note:  06/07/2012 10:25 AM  Lance [name]  has presented today for surgery, with the diagnosis of Dysphagia and GERD  The various methods of treatment have been discussed with the patient and family. After consideration of risks, benefits and other options for treatment, the patient has consented to  Procedure(s) (LRB) with comments: ESOPHAGOGASTRODUODENOSCOPY (EGD) WITH ESOPHAGEAL DILATION (N/A) - 11:30 as a surgical intervention .  The patient's history has been reviewed, patient examined, no change in status, stable for surgery.  I have reviewed the patient's chart and labs.  Questions were answered to the patient's satisfaction.     [name]  As above. EGD with dilation as feasible/ appropriate.The risks, benefits, limitations, alternatives and imponderables have been reviewed with the patient. Potential for esophageal dilation, biopsy, etc. have also been reviewed.  Questions have been answered. All parties agreeable.

## 2012-06-08 DIAGNOSIS — R1310 Dysphagia, unspecified: Secondary | ICD-10-CM | POA: Diagnosis not present

## 2012-06-08 DIAGNOSIS — R490 Dysphonia: Secondary | ICD-10-CM | POA: Diagnosis not present

## 2012-06-09 ENCOUNTER — Encounter: Payer: Self-pay | Admitting: Internal Medicine

## 2012-06-12 ENCOUNTER — Encounter (HOSPITAL_COMMUNITY): Payer: Self-pay | Admitting: Internal Medicine

## 2012-06-12 ENCOUNTER — Other Ambulatory Visit: Payer: Self-pay | Admitting: Otolaryngology

## 2012-06-12 ENCOUNTER — Encounter: Payer: Self-pay | Admitting: *Deleted

## 2012-06-12 DIAGNOSIS — R1310 Dysphagia, unspecified: Secondary | ICD-10-CM

## 2012-06-15 ENCOUNTER — Other Ambulatory Visit: Payer: Medicare Other

## 2012-06-17 ENCOUNTER — Other Ambulatory Visit: Payer: Self-pay

## 2012-06-21 ENCOUNTER — Ambulatory Visit
Admission: RE | Admit: 2012-06-21 | Discharge: 2012-06-21 | Disposition: A | Discharge status: Home / Self Care | Payer: Medicare Other | Source: Ambulatory Visit | Attending: Otolaryngology | Admitting: Otolaryngology

## 2012-06-21 DIAGNOSIS — R1310 Dysphagia, unspecified: Secondary | ICD-10-CM

## 2012-06-21 DIAGNOSIS — K2289 Other specified disease of esophagus: Secondary | ICD-10-CM | POA: Diagnosis not present

## 2012-06-21 DIAGNOSIS — K228 Other specified diseases of esophagus: Secondary | ICD-10-CM | POA: Diagnosis not present

## 2012-06-27 DIAGNOSIS — E1149 Type 2 diabetes mellitus with other diabetic neurological complication: Secondary | ICD-10-CM | POA: Diagnosis not present

## 2012-07-04 ENCOUNTER — Ambulatory Visit: Payer: Medicare Other | Admitting: Urgent Care

## 2012-07-11 DIAGNOSIS — R42 Dizziness and giddiness: Secondary | ICD-10-CM | POA: Diagnosis not present

## 2012-07-12 ENCOUNTER — Other Ambulatory Visit: Payer: Self-pay | Admitting: Family Medicine

## 2012-07-12 ENCOUNTER — Ambulatory Visit (HOSPITAL_COMMUNITY)
Admission: RE | Admit: 2012-07-12 | Discharge: 2012-07-12 | Disposition: A | Discharge status: Home / Self Care | Payer: Medicare Other | Source: Ambulatory Visit | Attending: Family Medicine | Admitting: Family Medicine

## 2012-07-12 DIAGNOSIS — R0600 Dyspnea, unspecified: Secondary | ICD-10-CM

## 2012-07-12 DIAGNOSIS — I2510 Atherosclerotic heart disease of native coronary artery without angina pectoris: Secondary | ICD-10-CM | POA: Insufficient documentation

## 2012-07-12 DIAGNOSIS — Z951 Presence of aortocoronary bypass graft: Secondary | ICD-10-CM | POA: Insufficient documentation

## 2012-07-12 DIAGNOSIS — R0989 Other specified symptoms and signs involving the circulatory and respiratory systems: Secondary | ICD-10-CM | POA: Diagnosis not present

## 2012-07-12 DIAGNOSIS — R0609 Other forms of dyspnea: Secondary | ICD-10-CM | POA: Insufficient documentation

## 2012-07-13 ENCOUNTER — Ambulatory Visit (INDEPENDENT_AMBULATORY_CARE_PROVIDER_SITE_OTHER): Payer: Medicare Other | Admitting: Cardiology

## 2012-07-13 ENCOUNTER — Encounter: Payer: Self-pay | Admitting: Cardiology

## 2012-07-13 VITALS — BP 121/72 | HR 87 | Ht 69.0 in | Wt 229.0 lb

## 2012-07-13 DIAGNOSIS — R0600 Dyspnea, unspecified: Secondary | ICD-10-CM

## 2012-07-13 DIAGNOSIS — I10 Essential (primary) hypertension: Secondary | ICD-10-CM

## 2012-07-13 DIAGNOSIS — R0609 Other forms of dyspnea: Secondary | ICD-10-CM

## 2012-07-13 DIAGNOSIS — R0989 Other specified symptoms and signs involving the circulatory and respiratory systems: Secondary | ICD-10-CM

## 2012-07-13 DIAGNOSIS — E785 Hyperlipidemia, unspecified: Secondary | ICD-10-CM

## 2012-07-13 DIAGNOSIS — I2510 Atherosclerotic heart disease of native coronary artery without angina pectoris: Secondary | ICD-10-CM | POA: Diagnosis not present

## 2012-07-13 DIAGNOSIS — Z4502 Encounter for adjustment and management of automatic implantable cardiac defibrillator: Secondary | ICD-10-CM

## 2012-07-13 DIAGNOSIS — F17200 Nicotine dependence, unspecified, uncomplicated: Secondary | ICD-10-CM | POA: Diagnosis not present

## 2012-07-13 NOTE — Assessment & Plan Note (Signed)
His last LDL was above 100. He was advised to take a statin. He has not done so. His noncompliance is a major issue.

## 2012-07-13 NOTE — Assessment & Plan Note (Signed)
This was put in years ago for sudden cardiac death. He has well-preserved systolic function.

## 2012-07-13 NOTE — Patient Instructions (Addendum)
Your physician recommends that you schedule a follow-up appointment in: ONE YEAR 

## 2012-07-13 NOTE — Progress Notes (Signed)
HPI Lance [name] returns today with dyspnea on exertion. He is now several months out from coronary bypass grafting. He has severe three-vessel disease. He had relatively good LV function. After surgery, he developed pneumonia twice. His shortness of breath was so bad he quit cardiac rehabilitation. He has not gone back.  Chest x-ray shows no acute abnormalities including no pleural effusion or suggestion of a pericardial effusion. He does have 100-pack-year smoking history and when questioned today he still smokes. He denies orthopnea, PND or edema. He does have a constant cough is clear. CBC was relatively normal recently.  Meds are numerous. He is on metoprolol but denies any wheezing. He is overweight.  He was started on a statin on last visit by Dr. Excell Seltzer. He is not taking it. He has not had followup blood work.  Past Medical History  Diagnosis Date  . ASCVD (arteriosclerotic cardiovascular disease)     70% mid left anterior descending lesion on cath in 06/1995; left anterior desending DES placed in 8/03 and RCA stent in 9/03; captain 3/05 revealed 90% second marginal for which PCI was performed, 70% PDA and a total obstruction of the first diagonal and marginal; sudden cardiac death in [place] in 2003-10-20 for which automatic implantable cardiac defibrillator placed; negativ stress nuclear 10/07  . Hypertension   . Hyperlipidemia   . Tobacco abuse     100 pack/year comsuption; cigarettes discontinued 2003; all tobacco products in 2008  . CVD (cerebrovascular disease) 05/2008    Transient ischemic attack; carotid ultrasound-plaque without focal disease  . Diabetes mellitus     Insulin requirement  . Degenerative joint disease 2002    C-spine fusion   . Erectile dysfunction   . Anxiety and depression   . Benign prostatic hypertrophy   . Other testicular hypofunction   . Esophageal reflux   . Diabetes mellitus without mention of complication   . Coronary artery disease   . Allergic  rhinitis, cause unspecified   . S/P endoscopy Dec 2011    RMR: nl esophagus, hyperplastic polyp, active gastritis, no H.pylori.   . ICD (implantable cardiac defibrillator) in place   . Pacemaker   . Hx-TIA (transient ischemic attack) 2010  . Tubular adenoma     Current Outpatient Prescriptions  Medication Sig Dispense Refill  . ALPRAZolam (XANAX) 1 MG tablet Take 1 mg by mouth at bedtime. For sleep      . aspirin 81 MG chewable tablet Chew 1 tablet (81 mg total) by mouth daily.  30 tablet  0  . bifidobacterium infantis (ALIGN) capsule Take 1 capsule by mouth daily.  14 capsule  0  . citalopram (CELEXA) 20 MG tablet Take 20 mg by mouth daily.        Marland Kitchen dipyridamole-aspirin (AGGRENOX) 25-200 MG per 12 hr capsule Take 2 capsules by mouth 2 (two) times daily.       . DULoxetine (CYMBALTA) 30 MG capsule Take 1 capsule by mouth Daily.      Marland Kitchen esomeprazole (NEXIUM) 40 MG capsule       . Exenatide (BYETTA 5 MCG PEN [place]) Inject 5 Units into the skin 2 (two) times daily.       . fexofenadine (ALLEGRA) 180 MG tablet Take 180 mg by mouth daily as needed. For allergies      . fluticasone (FLONASE) 50 MCG/ACT nasal spray Place 2 sprays into the nose daily as needed. For allergies      . gabapentin (NEURONTIN) 300 MG capsule Take 300 mg  by mouth 2 (two) times daily.       Marland Kitchen guaiFENesin (MUCINEX) 600 MG 12 hr tablet Take 2 tablets (1,200 mg total) by mouth 2 (two) times daily.      . insulin glargine (LANTUS SOLOSTAR) 100 UNIT/ML injection Inject 60 Units into the skin at bedtime.       Marland Kitchen losartan (COZAAR) 100 MG tablet Take 1 tablet (100 mg total) by mouth daily.  60 tablet  1  . metFORMIN (GLUCOPHAGE) 1000 MG tablet Take 1,000 mg by mouth 2 (two) times daily.      . metoprolol (LOPRESSOR) 100 MG tablet Take 100 mg by mouth 2 (two) times daily.      . nitroGLYCERIN (NITROSTAT) 0.4 MG SL tablet Place 1 tablet (0.4 mg total) under the tongue every 5 (five) minutes as needed. For chest pain  25 tablet  3  .  NOVOLOG FLEXPEN 100 UNIT/ML injection Inject 15-18 Units into the skin 3 (three) times daily before meals. Uses according to sliding scale at home. 15 -18 UNITS 3 TIMES DAILY 15 in am 15 lunch 15 pm      . simvastatin (ZOCOR) 20 MG tablet Take 1 tablet (20 mg total) by mouth daily at 6 PM.  30 tablet  12  . traZODone (DESYREL) 100 MG tablet Take 100 mg by mouth at bedtime.       . vitamin B-12 (CYANOCOBALAMIN) 1000 MCG tablet Take 1,000 mcg by mouth daily.        . Vitamin D, Ergocalciferol, (DRISDOL) 50000 UNITS CAPS Take 50,000 Units by mouth every 30 (thirty) days. Mondays       No current facility-administered medications for this visit.    Allergies  Allergen Reactions  . Penicillins Hives  . Morphine And Related Other (See Comments)    hallucinations  . Other     Adhesive tape  . Percocet (Oxycodone-Acetaminophen) Other (See Comments)    hallucinations    Family History  Problem Relation Age of Onset  . Heart attack Other     Myocardial infarction  . Colon cancer Neg Hx     History   Social History  . Marital Status: Married    Spouse Name: N/A    Number of Children: N/A  . Years of Education: N/A   Occupational History  . Owner automotive business    Social History Main Topics  . Smoking status: Former Smoker -- 3.00 packs/day for 40 years    Types: Cigarettes    Quit date: 05/03/2006  . Smokeless tobacco: Current User    Types: Chew     Comment: occasionally  . Alcohol Use: No  . Drug Use: No  . Sexually Active: Yes    Birth Control/ Protection: Post-menopausal   Other Topics Concern  . Not on file   Social History Narrative   Lives in [place] with his family          ROS ALL NEGATIVE EXCEPT THOSE NOTED IN HPI  PE  General Appearance: well developed, well nourished in no acute distress, overweight HEENT: symmetrical face, PERRLA,  Neck: no JVD, thyromegaly, or adenopathy, trachea midline Chest: symmetric without deformity Cardiac: PMI  non-displaced, RRR, normal S1, S2, no gallop or murmur Lung: Inspiratory expiratory mild rhonchi. Clear with cough. Decreased breath sounds throughout. Vascular: all pulses full without bruits  Abdominal: nondistended, nontender, good bowel sounds, no HSM, no bruits Extremities: no cyanosis, clubbing or edema, no sign of DVT, no varicosities  Skin: normal color, no  rashes Neuro: alert and oriented x 3, non-focal Pysch: normal affect  EKG Normal sinus rhythm, nonspecific T wave changes. Compared to his old EKG, his ST segment changes have markedly improved  BMET    Component Value Date/Time   NA 137 05/31/2012 1512   K 4.0 05/31/2012 1512   CL 104 05/31/2012 1512   CO2 26 01/14/2012 0510   GLUCOSE 204* 05/31/2012 1512   BUN 15 05/31/2012 1512   CREATININE 1.00 05/31/2012 1512   CREATININE 0.95 10/22/2011 0847   CALCIUM 8.7 01/14/2012 0510   GFRNONAA >90 01/14/2012 0510   GFRAA >90 01/14/2012 0510    Lipid Panel     Component Value Date/Time   CHOL 203* 12/02/2011 0441   TRIG 204* 12/02/2011 0441   HDL 31* 12/02/2011 0441   CHOLHDL 6.5 12/02/2011 0441   VLDL 41* 12/02/2011 0441   LDLCALC 131* 12/02/2011 0441    CBC    Component Value Date/Time   WBC 6.7 01/14/2012 0510   RBC 2.85* 01/14/2012 0510   HGB 13.9 05/31/2012 1512   HCT 41.0 05/31/2012 1512   PLT 108* 01/14/2012 0510   MCV 85.6 01/14/2012 0510   MCH 29.1 01/14/2012 0510   MCHC 34.0 01/14/2012 0510   RDW 14.1 01/14/2012 0510   LYMPHSABS 1.9 12/27/2011 1247   MONOABS 0.5 12/27/2011 1247   EOSABS 0.1 12/27/2011 1247   BASOSABS 0.0 12/27/2011 1247

## 2012-07-13 NOTE — Assessment & Plan Note (Signed)
I had a long cope with the patient and his wife. His dyspnea now is not cardiac but pulmonary. Before he was probably limited by severe three-vessel disease, now he is limited by significant COPD. He has a long-standing tobacco history and still smokes. I have strongly encouraged him to stop since he is now on a significant decline in his functional status. He says he will quit. No further cardiac workup at this time.

## 2012-07-20 ENCOUNTER — Telehealth: Payer: Self-pay | Admitting: Family Medicine

## 2012-07-20 NOTE — Telephone Encounter (Signed)
Nurse: Dennie BibleSharyon Cable dob [phone] Caller: Veneta Penton Msg: RX/given Losartan Potassium 50mg  - prior 100mg  * making sure of which dosage the patient will need. CB: [phone]

## 2012-07-20 NOTE — Telephone Encounter (Signed)
Review/no message needed.

## 2012-07-20 NOTE — Telephone Encounter (Signed)
50 mg is the dose. Follow up within 30 days to see me and check bp

## 2012-07-20 NOTE — Telephone Encounter (Signed)
Notified that 50mg  is the correct dose and to follow up within one month

## 2012-07-21 ENCOUNTER — Encounter: Payer: Self-pay | Admitting: Internal Medicine

## 2012-07-24 ENCOUNTER — Ambulatory Visit (INDEPENDENT_AMBULATORY_CARE_PROVIDER_SITE_OTHER): Payer: Medicare Other | Admitting: Gastroenterology

## 2012-07-24 ENCOUNTER — Encounter: Payer: Self-pay | Admitting: Gastroenterology

## 2012-07-24 VITALS — BP 130/80 | HR 90 | Temp 98.4°F | Ht 69.0 in | Wt 229.2 lb

## 2012-07-24 DIAGNOSIS — K219 Gastro-esophageal reflux disease without esophagitis: Secondary | ICD-10-CM

## 2012-07-24 MED ORDER — ESOMEPRAZOLE MAGNESIUM 40 MG PO CPDR: 40.0000 mg | DELAYED_RELEASE_CAPSULE | Freq: Two times a day (BID) | ORAL | Status: DC
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
## 2012-07-24 NOTE — Assessment & Plan Note (Signed)
67 year old male with history of GERD and dysphagia, s/p EGD Feb 2014 by Dr. Jena Gauss with normal esophagus s/p 75 French empiric dilation, benign gastric hyperplastic polyp. Dysphagia symptoms improved. Notes severe GERD in the presence of once daily Nexium. He is attempting to follow the GERD diet. Notes historical improvement with BID PPI therapy. Will increase Nexium to BID again, provide samples, and start a prior authorization request for Nexium BID. He will return in 4 weeks to follow-up with Dr. Jena Gauss regarding GERD symptoms.

## 2012-07-24 NOTE — Progress Notes (Signed)
Faxed to PCP

## 2012-07-24 NOTE — Patient Instructions (Addendum)
We have provided samples of Nexium so you may begin taking this twice a day again.   We will look into the insurance company issue and try to fix this.   Dr. Jena Gauss will see you again in about 4 weeks.   Diet for Gastroesophageal Reflux Disease, Adult Reflux (acid reflux) is when acid from your stomach flows up into the esophagus. When acid comes in contact with the esophagus, the acid causes irritation and soreness (inflammation) in the esophagus. When reflux happens often or so severely that it causes damage to the esophagus, it is called gastroesophageal reflux disease (GERD). Nutrition therapy can help ease the discomfort of GERD. FOODS OR DRINKS TO AVOID OR LIMIT  Smoking or chewing tobacco. Nicotine is one of the most potent stimulants to acid production in the gastrointestinal tract.  Caffeinated and decaffeinated coffee and black tea.  Regular or low-calorie carbonated beverages or energy drinks (caffeine-free carbonated beverages are allowed).   Strong spices, such as black pepper, white pepper, red pepper, cayenne, curry powder, and chili powder.  Peppermint or spearmint.  Chocolate.  High-fat foods, including meats and fried foods. Extra added fats including oils, butter, salad dressings, and nuts. Limit these to less than 8 tsp per day.  Fruits and vegetables if they are not tolerated, such as citrus fruits or tomatoes.  Alcohol.  Any food that seems to aggravate your condition. If you have questions regarding your diet, call your caregiver or a registered dietitian. OTHER THINGS THAT MAY HELP GERD INCLUDE:   Eating your meals slowly, in a relaxed setting.  Eating 5 to 6 small meals per day instead of 3 large meals.  Eliminating food for a period of time if it causes distress.  Not lying down until 3 hours after eating a meal.  Keeping the head of your bed raised 6 to 9 inches (15 to 23 cm) by using a foam wedge or blocks under the legs of the bed. Lying flat may  make symptoms worse.  Being physically active. Weight loss may be helpful in reducing reflux in overweight or obese adults.  Wear loose fitting clothing EXAMPLE MEAL PLAN This meal plan is approximately 2,000 calories based on https://www.[facility].org/ meal planning guidelines. Breakfast   cup cooked oatmeal.  1 cup strawberries.  1 cup low-fat milk.  1 oz almonds. Snack  1 cup cucumber slices.  6 oz yogurt (made from low-fat or fat-free milk). Lunch  2 slice whole-wheat bread.  2 oz sliced [place].  2 tsp mayonnaise.  1 cup blueberries.  1 cup snap peas. Snack  6 whole-wheat crackers.  1 oz string cheese. Dinner   cup brown rice.  1 cup mixed veggies.  1 tsp olive oil.  3 oz grilled fish. Document Released: 04/19/2005 Document Revised: 07/12/2011 Document Reviewed: 03/05/2011 [facility] Patient Information 2013 [place], [place].

## 2012-08-11 DIAGNOSIS — IMO0001 Reserved for inherently not codable concepts without codable children: Secondary | ICD-10-CM | POA: Diagnosis not present

## 2012-08-11 DIAGNOSIS — E559 Vitamin D deficiency, unspecified: Secondary | ICD-10-CM | POA: Diagnosis not present

## 2012-08-11 DIAGNOSIS — E785 Hyperlipidemia, unspecified: Secondary | ICD-10-CM | POA: Diagnosis not present

## 2012-08-18 ENCOUNTER — Encounter: Payer: Self-pay | Admitting: *Deleted

## 2012-08-18 DIAGNOSIS — I10 Essential (primary) hypertension: Secondary | ICD-10-CM | POA: Diagnosis not present

## 2012-08-18 DIAGNOSIS — IMO0001 Reserved for inherently not codable concepts without codable children: Secondary | ICD-10-CM | POA: Diagnosis not present

## 2012-08-18 DIAGNOSIS — Z9119 Patient's noncompliance with other medical treatment and regimen: Secondary | ICD-10-CM | POA: Diagnosis not present

## 2012-08-18 DIAGNOSIS — E785 Hyperlipidemia, unspecified: Secondary | ICD-10-CM | POA: Diagnosis not present

## 2012-08-21 DIAGNOSIS — M542 Cervicalgia: Secondary | ICD-10-CM | POA: Diagnosis not present

## 2012-08-21 DIAGNOSIS — M5030 Other cervical disc degeneration, unspecified cervical region: Secondary | ICD-10-CM | POA: Diagnosis not present

## 2012-08-21 DIAGNOSIS — M5412 Radiculopathy, cervical region: Secondary | ICD-10-CM | POA: Diagnosis not present

## 2012-08-21 DIAGNOSIS — IMO0002 Reserved for concepts with insufficient information to code with codable children: Secondary | ICD-10-CM | POA: Diagnosis not present

## 2012-08-22 ENCOUNTER — Ambulatory Visit: Payer: Medicare Other | Admitting: Internal Medicine

## 2012-08-22 ENCOUNTER — Ambulatory Visit: Payer: Medicare Other | Admitting: Cardiology

## 2012-08-23 ENCOUNTER — Encounter: Payer: Self-pay | Admitting: Family Medicine

## 2012-08-23 ENCOUNTER — Ambulatory Visit (INDEPENDENT_AMBULATORY_CARE_PROVIDER_SITE_OTHER): Payer: Medicare Other | Admitting: Family Medicine

## 2012-08-23 VITALS — BP 130/84 | HR 80 | Wt 224.5 lb

## 2012-08-23 DIAGNOSIS — I10 Essential (primary) hypertension: Secondary | ICD-10-CM | POA: Diagnosis not present

## 2012-08-23 MED ORDER — LOSARTAN POTASSIUM-HCTZ 50-12.5 MG PO TABS: 1.0000 | ORAL_TABLET | Freq: Every day | ORAL | Status: DC

## 2012-08-23 NOTE — Progress Notes (Signed)
  Subjective:    Patient ID: Lance [name], male    DOB: 1945/10/07, 67 y.o.   MRN: [id number]  Hypertension This is a chronic problem. The current episode started more than 1 year ago. The problem has been waxing and waning since onset. The problem is uncontrolled. Associated symptoms include headaches (had occasional headaches over the past few weeks states his blood pressure was up. A couple different times he checked it with a monitor he wasn't sure if it was accurate. It was high.). Pertinent negatives include no chest pain, malaise/fatigue, peripheral edema, PND or shortness of breath (breathing is stable.). Risk factors for coronary artery disease include diabetes mellitus, dyslipidemia, family history, male gender and sedentary lifestyle. Past treatments include angiotensin blockers and beta blockers. The current treatment provides mild improvement. There are no compliance problems.  Hypertensive end-organ damage includes CAD/MI and CVA. There is no history of angina, kidney disease or heart failure.      Review of Systems  Constitutional: Negative for fever, malaise/fatigue, activity change and appetite change.  HENT: Negative for congestion and rhinorrhea.   Respiratory: Negative for apnea, chest tightness and shortness of breath (breathing is stable.).   Cardiovascular: Negative for chest pain and PND.  Gastrointestinal: Negative for abdominal pain.  Neurological: Positive for headaches (had occasional headaches over the past few weeks states his blood pressure was up. A couple different times he checked it with a monitor he wasn't sure if it was accurate. It was high.).       Objective:   Physical Exam  Vitals reviewed. Constitutional: He appears well-developed and well-nourished.  HENT:  Head: Normocephalic.  Neck: Normal range of motion. Neck supple. No thyromegaly present.  Cardiovascular: Normal rate, regular rhythm and normal heart sounds.  Exam reveals no gallop.   No  murmur heard. Pulmonary/Chest: Breath sounds normal. No respiratory distress. He has no wheezes.  Musculoskeletal: He exhibits no edema.  Lymphadenopathy:    He has no cervical adenopathy.  Neurological: He is alert.  Skin: Skin is warm and dry.          Assessment & Plan:  Hypertension-subpar control. S. Reading was 148/82. I recommend switching to low starch and 50 mg/12.5 mg HCTZ one every morning. Also recommend following him up in the course of the next 3-4 weeks he will be getting a blood pressure monitor he'll bring it with him and we will review of her how it is doing for him.

## 2012-08-23 NOTE — Patient Instructions (Signed)
Bring monitor and medications to next visit

## 2012-08-24 ENCOUNTER — Telehealth: Payer: Self-pay

## 2012-08-24 NOTE — Telephone Encounter (Signed)
Spoke with Lorene Dy at [facility]- she stated pt does not need a PA for nexium bid- she said he didn't pick it up because of the price. It is $225.00 and he is in the doughnut hole.

## 2012-08-24 NOTE — Telephone Encounter (Signed)
Tried to call pt- LMOM We have documentation that pt has tried prilosec bid and prevacid. Not sure about protonix. Will ask him when he calls back.

## 2012-08-24 NOTE — Telephone Encounter (Signed)
Will Prilosec or Protonix be cheaper? If not, can we provide samples in the interim

## 2012-08-24 NOTE — Telephone Encounter (Signed)
Pt has ov with RMR on 08/29/12

## 2012-08-28 MED ORDER — PANTOPRAZOLE SODIUM 40 MG PO TBEC: 40.0000 mg | DELAYED_RELEASE_TABLET | Freq: Two times a day (BID) | ORAL | Status: DC

## 2012-08-28 NOTE — Telephone Encounter (Signed)
Pt has not tried Protonix. We can call it in to [facility].

## 2012-08-28 NOTE — Telephone Encounter (Signed)
Routing to refill box. Pt needs rx for protonix

## 2012-08-28 NOTE — Telephone Encounter (Signed)
Suspect he will need BID based on his history of difficult to control GERD.   RX for protonix BID.

## 2012-08-29 ENCOUNTER — Encounter: Payer: Self-pay | Admitting: Internal Medicine

## 2012-08-29 ENCOUNTER — Ambulatory Visit (INDEPENDENT_AMBULATORY_CARE_PROVIDER_SITE_OTHER): Payer: Medicare Other | Admitting: Internal Medicine

## 2012-08-29 VITALS — BP 117/72 | HR 81 | Temp 97.6°F | Ht 66.0 in | Wt 225.6 lb

## 2012-08-29 DIAGNOSIS — K219 Gastro-esophageal reflux disease without esophagitis: Secondary | ICD-10-CM
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
## 2012-08-29 NOTE — Patient Instructions (Addendum)
Hold Nexium; trial of  Dexilant 60 mg daily No late night meals Elevate head of bed  GERD sheet provided  Pepsid complete - 2 tablets for breakthrough reflux   Loose 15 pounds between now and end of year  Call me in 3 week with a progress report  OV 8 weeks

## 2012-09-05 DIAGNOSIS — E1149 Type 2 diabetes mellitus with other diabetic neurological complication: Secondary | ICD-10-CM | POA: Diagnosis not present

## 2012-09-12 ENCOUNTER — Telehealth: Payer: Self-pay

## 2012-09-12 MED ORDER — DEXLANSOPRAZOLE 60 MG PO CPDR: 60.0000 mg | DELAYED_RELEASE_CAPSULE | Freq: Two times a day (BID) | ORAL | Status: DC

## 2012-09-12 NOTE — Telephone Encounter (Signed)
Done

## 2012-09-12 NOTE — Telephone Encounter (Signed)
Pt called to say the Dexilant was working great. He is taking it bid and he is requesting a refill sent to [facility] pharmacy.

## 2012-09-14 ENCOUNTER — Ambulatory Visit (INDEPENDENT_AMBULATORY_CARE_PROVIDER_SITE_OTHER): Payer: Medicare Other | Admitting: Family Medicine

## 2012-09-14 ENCOUNTER — Encounter: Payer: Self-pay | Admitting: Family Medicine

## 2012-09-14 VITALS — BP 134/84 | Wt 228.0 lb

## 2012-09-14 DIAGNOSIS — F329 Major depressive disorder, single episode, unspecified: Secondary | ICD-10-CM

## 2012-09-14 DIAGNOSIS — I10 Essential (primary) hypertension: Secondary | ICD-10-CM | POA: Diagnosis not present

## 2012-09-14 NOTE — Progress Notes (Signed)
  Subjective:    Patient ID: Lance [name], male    DOB: 01/23/1946, 67 y.o.   MRN: [id number]  Hypertension This is a chronic problem. The current episode started more than 1 year ago. The problem has been gradually improving since onset. The problem is controlled. Pertinent negatives include no anxiety, blurred vision, chest pain, palpitations, peripheral edema or shortness of breath. There are no associated agents to hypertension. Risk factors for coronary artery disease include diabetes mellitus, dyslipidemia, family history, male gender and stress. Past treatments include angiotensin blockers and diuretics. The current treatment provides moderate improvement. There are no compliance problems.  Hypertensive end-organ damage includes angina, CAD/MI and CVA. There is no history of kidney disease, heart failure or left ventricular hypertrophy.      Review of Systems  Constitutional: Negative for activity change and appetite change.  HENT: Negative for congestion.   Eyes: Negative for blurred vision.  Respiratory: Negative for cough, chest tightness and shortness of breath.   Cardiovascular: Negative for chest pain and palpitations.       Objective:   Physical Exam  Vitals reviewed. Constitutional: He appears well-developed and well-nourished. No distress.  HENT:  Head: Normocephalic and atraumatic.  Cardiovascular: Normal rate, regular rhythm and normal heart sounds.   No murmur heard. Pulmonary/Chest: Effort normal. No respiratory distress. He has no wheezes.  Musculoskeletal: He exhibits no edema.  Lymphadenopathy:    He has no cervical adenopathy.          Assessment & Plan:  HTN-patient overall doing fairly well blood pressure recheck looks good. Continue current measures followup in 3-4 months Depression patient actually doing much better with the better weather he gets outside [place] this helps him.

## 2012-10-12 ENCOUNTER — Other Ambulatory Visit: Payer: Self-pay | Admitting: Family Medicine

## 2012-10-12 NOTE — Telephone Encounter (Signed)
Ok wis 6 mo tot

## 2012-10-18 ENCOUNTER — Encounter: Payer: Self-pay | Admitting: Internal Medicine

## 2012-10-23 ENCOUNTER — Other Ambulatory Visit: Payer: Self-pay | Admitting: Family Medicine

## 2012-10-23 NOTE — Telephone Encounter (Signed)
Ok times 4 total 

## 2012-10-23 NOTE — Telephone Encounter (Signed)
Pt aware of medication called into pharmacy

## 2012-10-23 NOTE — Telephone Encounter (Signed)
Called in medication to pharmacy

## 2012-10-24 ENCOUNTER — Encounter: Payer: Self-pay | Admitting: Family Medicine

## 2012-10-24 ENCOUNTER — Ambulatory Visit (INDEPENDENT_AMBULATORY_CARE_PROVIDER_SITE_OTHER): Payer: Medicare Other | Admitting: Family Medicine

## 2012-10-24 VITALS — BP 130/74 | Temp 98.5°F | Ht 69.0 in | Wt 220.4 lb

## 2012-10-24 DIAGNOSIS — J019 Acute sinusitis, unspecified: Secondary | ICD-10-CM | POA: Diagnosis not present

## 2012-10-24 MED ORDER — LEVOFLOXACIN 500 MG PO TABS: 500.0000 mg | ORAL_TABLET | Freq: Every day | ORAL | Status: AC

## 2012-10-24 NOTE — Progress Notes (Signed)
  Subjective:    Patient ID: Lance [name], male    DOB: 1945-12-23, 67 y.o.   MRN: [id number]  Sinusitis This is a new problem. The current episode started 1 to 4 weeks ago. The problem is unchanged. There has been no fever. His pain is at a severity of 4/10. The pain is moderate. Associated symptoms include congestion, coughing and sinus pressure. Pertinent negatives include no ear pain. (Runny nose) Past treatments include nothing. The treatment provided no relief.   Patient does not smoke he states left side of the nasal area drainage for the past couple weeks discolored drainage sinus pressure and pain   Review of Systems  Constitutional: Negative for fever and activity change.  HENT: Positive for congestion, rhinorrhea and sinus pressure. Negative for ear pain.   Eyes: Negative for discharge.  Respiratory: Positive for cough. Negative for wheezing.   Cardiovascular: Negative for chest pain.       Objective:   Physical Exam  Nursing note and vitals reviewed. Constitutional: He appears well-developed.  HENT:  Head: Normocephalic.  Mouth/Throat: Oropharynx is clear and moist. No oropharyngeal exudate.  Neck: Normal range of motion.  Cardiovascular: Normal rate, regular rhythm and normal heart sounds.   No murmur heard. Pulmonary/Chest: Effort normal and breath sounds normal. He has no wheezes.  Lymphadenopathy:    He has no cervical adenopathy.  Neurological: He exhibits normal muscle tone.  Skin: Skin is warm and dry.          Assessment & Plan:  Sinusitis Levaquin 10 days as directed if progressive troubles or worse followup warning signs were discussed

## 2012-11-01 DIAGNOSIS — E1165 Type 2 diabetes mellitus with hyperglycemia: Secondary | ICD-10-CM | POA: Diagnosis not present

## 2012-11-01 DIAGNOSIS — H2510 Age-related nuclear cataract, unspecified eye: Secondary | ICD-10-CM | POA: Diagnosis not present

## 2012-11-01 DIAGNOSIS — E11359 Type 2 diabetes mellitus with proliferative diabetic retinopathy without macular edema: Secondary | ICD-10-CM | POA: Diagnosis not present

## 2012-11-01 DIAGNOSIS — E1139 Type 2 diabetes mellitus with other diabetic ophthalmic complication: Secondary | ICD-10-CM | POA: Diagnosis not present

## 2012-11-01 DIAGNOSIS — H3582 Retinal ischemia: Secondary | ICD-10-CM | POA: Diagnosis not present

## 2012-11-02 ENCOUNTER — Telehealth: Payer: Self-pay | Admitting: Family Medicine

## 2012-11-02 NOTE — Telephone Encounter (Signed)
Ref lev 500 qd for ten

## 2012-11-02 NOTE — Telephone Encounter (Signed)
Patient is feeling a little better after finishing his antibiotics, but not 100%. He is requesting to have another antibiotic called in. Last OV 10/24/2012.Marland Kitchen  [facility]

## 2012-11-02 NOTE — Telephone Encounter (Signed)
levaquin 500 one a day for 10 days called into [facility]. Pt notified on voicemail

## 2012-11-09 ENCOUNTER — Ambulatory Visit (INDEPENDENT_AMBULATORY_CARE_PROVIDER_SITE_OTHER): Payer: Medicare Other | Admitting: Family Medicine

## 2012-11-09 ENCOUNTER — Encounter: Payer: Self-pay | Admitting: Family Medicine

## 2012-11-09 VITALS — Temp 97.8°F | Wt 216.0 lb

## 2012-11-09 DIAGNOSIS — I10 Essential (primary) hypertension: Secondary | ICD-10-CM

## 2012-11-09 MED ORDER — AMLODIPINE BESYLATE 2.5 MG PO TABS: ORAL_TABLET | ORAL | Status: DC

## 2012-11-09 NOTE — Progress Notes (Signed)
  Subjective:    Patient ID: Lance [name], male    DOB: November 07, 1945, 67 y.o.   MRN: [id number]  Hypertension Associated symptoms include sweats. (Nausea)   patient denies any chest pressure tightness pain he states he just felt bad and nauseated no wheezing difficulty breathing or swelling in the legs his blood pressure was elevated it concerned him he was taken his medicine this happens periodically. Not frequently. PMH hypertension diabetes    Review of Systems See above    Objective:   Physical Exam Blood pressure or is slightly elevated today lungs are clear hearts regular extremities no edema skin warm dry blood pressure recheck was normal       Assessment & Plan:  Intermittent hypertension continue all of his medications I went ahead and prescribed amlodipine 2.5 mg half or a whole tablet twice a day when necessary when blood pressure good is elevated no need for adding this on a daily basis since most of his readings are normal followup here when necessary

## 2012-11-14 ENCOUNTER — Encounter: Payer: Self-pay | Admitting: Internal Medicine

## 2012-11-14 ENCOUNTER — Ambulatory Visit (INDEPENDENT_AMBULATORY_CARE_PROVIDER_SITE_OTHER): Payer: Medicare Other | Admitting: Internal Medicine

## 2012-11-14 VITALS — BP 132/73 | HR 83 | Temp 98.4°F | Ht 66.0 in | Wt 216.0 lb

## 2012-11-14 DIAGNOSIS — K219 Gastro-esophageal reflux disease without esophagitis: Secondary | ICD-10-CM | POA: Diagnosis not present

## 2012-11-14 DIAGNOSIS — E1149 Type 2 diabetes mellitus with other diabetic neurological complication: Secondary | ICD-10-CM | POA: Diagnosis not present
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
## 2012-11-14 NOTE — Patient Instructions (Signed)
Take Dexilant 60 mg once daily  May continue to use Pepsid for breakthrough symptoms  Loose another 10 pounds  Office visit in 3 months

## 2012-11-15 DIAGNOSIS — I10 Essential (primary) hypertension: Secondary | ICD-10-CM | POA: Diagnosis not present

## 2012-11-15 DIAGNOSIS — E785 Hyperlipidemia, unspecified: Secondary | ICD-10-CM | POA: Diagnosis not present

## 2012-11-15 DIAGNOSIS — IMO0001 Reserved for inherently not codable concepts without codable children: Secondary | ICD-10-CM | POA: Diagnosis not present

## 2012-11-23 DIAGNOSIS — IMO0001 Reserved for inherently not codable concepts without codable children: Secondary | ICD-10-CM | POA: Diagnosis not present

## 2012-11-23 DIAGNOSIS — E785 Hyperlipidemia, unspecified: Secondary | ICD-10-CM | POA: Diagnosis not present

## 2012-11-23 DIAGNOSIS — E669 Obesity, unspecified: Secondary | ICD-10-CM | POA: Diagnosis not present

## 2012-11-23 DIAGNOSIS — I10 Essential (primary) hypertension: Secondary | ICD-10-CM | POA: Diagnosis not present

## 2012-11-27 ENCOUNTER — Other Ambulatory Visit: Payer: Self-pay

## 2012-11-27 MED ORDER — ASPIRIN-DIPYRIDAMOLE ER 25-200 MG PO CP12: 1.0000 | ORAL_CAPSULE | Freq: Two times a day (BID) | ORAL | Status: DC

## 2012-11-27 NOTE — Telephone Encounter (Signed)
Former [facility] patient, has not been assigned a new provider, auth refills via [facility]

## 2012-11-30 ENCOUNTER — Other Ambulatory Visit: Payer: Self-pay | Admitting: *Deleted

## 2012-11-30 DIAGNOSIS — I10 Essential (primary) hypertension: Secondary | ICD-10-CM

## 2012-11-30 MED ORDER — DULOXETINE HCL 30 MG PO CPEP: 30.0000 mg | ORAL_CAPSULE | Freq: Every day | ORAL | Status: DC

## 2012-11-30 MED ORDER — METOPROLOL TARTRATE 100 MG PO TABS: 100.0000 mg | ORAL_TABLET | Freq: Two times a day (BID) | ORAL | Status: DC

## 2012-12-04 DIAGNOSIS — M5030 Other cervical disc degeneration, unspecified cervical region: Secondary | ICD-10-CM | POA: Diagnosis not present

## 2012-12-04 DIAGNOSIS — M542 Cervicalgia: Secondary | ICD-10-CM | POA: Diagnosis not present

## 2012-12-04 DIAGNOSIS — M5412 Radiculopathy, cervical region: Secondary | ICD-10-CM | POA: Diagnosis not present

## 2012-12-04 DIAGNOSIS — IMO0002 Reserved for concepts with insufficient information to code with codable children: Secondary | ICD-10-CM | POA: Diagnosis not present

## 2012-12-07 ENCOUNTER — Ambulatory Visit (INDEPENDENT_AMBULATORY_CARE_PROVIDER_SITE_OTHER): Payer: Medicare Other | Admitting: Family Medicine

## 2012-12-07 ENCOUNTER — Other Ambulatory Visit: Payer: Self-pay | Admitting: Family Medicine

## 2012-12-07 ENCOUNTER — Encounter: Payer: Self-pay | Admitting: Family Medicine

## 2012-12-07 VITALS — BP 130/70 | Ht 69.0 in | Wt 214.2 lb

## 2012-12-07 DIAGNOSIS — I10 Essential (primary) hypertension: Secondary | ICD-10-CM

## 2012-12-07 NOTE — Progress Notes (Signed)
  Subjective:    Patient ID: Lance [name], male    DOB: Mar 09, 1946, 67 y.o.   MRN: [id number]  Hypertension This is a chronic problem. The current episode started more than 1 year ago. The problem has been gradually improving since onset. The problem is controlled. There are no associated agents to hypertension. There are no known risk factors for coronary artery disease. Treatments tried: amlodipine. The current treatment provides significant improvement. There are no compliance problems.      This is a recheck. He feels things have been going well he's been watching his diet.his A1c 7 his cholesterol kidney functions and diabetes is followed by the endocrinologist. He also sees cardiology. Review of Systems Denies being depressed denies chest tightness shortness of breath    Objective:   Physical Exam Patient is disabled Lungs are clear hearts regular pulse normal Blood pressure good on recheck 120/72       Assessment & Plan:  HTN very good control continue current measures watch diet he physically active followup again in 6 months  Patient doing well with his moods continue Celexa

## 2012-12-25 ENCOUNTER — Other Ambulatory Visit: Payer: Self-pay | Admitting: *Deleted

## 2012-12-25 ENCOUNTER — Telehealth: Payer: Self-pay | Admitting: *Deleted

## 2012-12-25 DIAGNOSIS — R197 Diarrhea, unspecified: Secondary | ICD-10-CM | POA: Diagnosis not present

## 2012-12-25 MED ORDER — METRONIDAZOLE 500 MG PO TABS: 500.0000 mg | ORAL_TABLET | Freq: Four times a day (QID) | ORAL | Status: DC

## 2012-12-25 NOTE — Telephone Encounter (Signed)
Lance [name] diagnosed with c diff. Lance [name] is having diarrhea. Should he get tested for c diff also.

## 2012-12-25 NOTE — Telephone Encounter (Signed)
Discussed with bernice. Lab papers ready for pick up and med sent to pharm.

## 2012-12-25 NOTE — Telephone Encounter (Signed)
2 things,1- c diff toxin test, 2- flagly 500 one qid for 7 days

## 2012-12-27 LAB — CLOSTRIDIUM DIFFICILE EIA: CDIFTX: NEGATIVE

## 2013-01-23 DIAGNOSIS — E1149 Type 2 diabetes mellitus with other diabetic neurological complication: Secondary | ICD-10-CM | POA: Diagnosis not present

## 2013-01-23 DIAGNOSIS — B351 Tinea unguium: Secondary | ICD-10-CM | POA: Diagnosis not present

## 2013-01-29 ENCOUNTER — Other Ambulatory Visit: Payer: Self-pay

## 2013-01-29 MED ORDER — DEXLANSOPRAZOLE 60 MG PO CPDR: 60.0000 mg | DELAYED_RELEASE_CAPSULE | Freq: Two times a day (BID) | ORAL | Status: DC

## 2013-01-31 ENCOUNTER — Encounter: Payer: Self-pay | Admitting: Internal Medicine

## 2013-02-14 ENCOUNTER — Ambulatory Visit (INDEPENDENT_AMBULATORY_CARE_PROVIDER_SITE_OTHER): Payer: Medicare Other

## 2013-02-14 DIAGNOSIS — Z23 Encounter for immunization: Secondary | ICD-10-CM | POA: Diagnosis not present

## 2013-02-16 ENCOUNTER — Other Ambulatory Visit: Payer: Self-pay | Admitting: Family Medicine

## 2013-02-19 ENCOUNTER — Encounter: Payer: Self-pay | Admitting: Family Medicine

## 2013-02-19 ENCOUNTER — Ambulatory Visit (INDEPENDENT_AMBULATORY_CARE_PROVIDER_SITE_OTHER): Payer: Medicare Other | Admitting: Family Medicine

## 2013-02-19 VITALS — BP 122/78 | Temp 98.5°F | Ht 69.0 in | Wt 219.2 lb

## 2013-02-19 DIAGNOSIS — J329 Chronic sinusitis, unspecified: Secondary | ICD-10-CM

## 2013-02-19 MED ORDER — LEVOFLOXACIN 500 MG PO TABS: 500.0000 mg | ORAL_TABLET | Freq: Every day | ORAL | Status: AC

## 2013-02-19 NOTE — Progress Notes (Signed)
  Subjective:    Patient ID: Lance [name], male    DOB: 1946-04-16, 67 y.o.   MRN: [id number]  HPI Patient arrives with cough, congestion, headache and body aches since Thurs.  Notes wheezinfg at night. Productive of yellow phlegm,  History of pneumonia last winter which required hospitalization.  Significant history of smoking.     Review of Systems No vomiting no diarrhea positive headache some achiness in muscles diminished energy ROS otherwise negative    Objective:   Physical Exam  Alert hydration good. TMs normal frontal fullness congestion tenderness pharynx erythema neck supple lungs no crackles or wheezes no tachypnea      Assessment & Plan:  Impression #1 rhinosinusitis #2 probable rhonchi is plan Levaquin 500 daily 10 days. Symptomatic care discussed. Warning signs discussed. WSL

## 2013-02-27 ENCOUNTER — Ambulatory Visit (INDEPENDENT_AMBULATORY_CARE_PROVIDER_SITE_OTHER): Payer: Medicare Other | Admitting: Family Medicine

## 2013-02-27 ENCOUNTER — Other Ambulatory Visit: Payer: Self-pay | Admitting: Neurology

## 2013-02-27 ENCOUNTER — Encounter: Payer: Self-pay | Admitting: Family Medicine

## 2013-02-27 VITALS — BP 130/90 | Temp 98.9°F | Ht 69.0 in | Wt 213.0 lb

## 2013-02-27 DIAGNOSIS — J019 Acute sinusitis, unspecified: Secondary | ICD-10-CM

## 2013-02-27 MED ORDER — ASPIRIN-DIPYRIDAMOLE ER 25-200 MG PO CP12: 1.0000 | ORAL_CAPSULE | Freq: Two times a day (BID) | ORAL | Status: DC

## 2013-02-27 MED ORDER — VARDENAFIL HCL 10 MG PO TABS: 10.0000 mg | ORAL_TABLET | ORAL | Status: DC | PRN

## 2013-02-27 MED ORDER — SULFAMETHOXAZOLE-TMP DS 800-160 MG PO TABS: 1.0000 | ORAL_TABLET | Freq: Two times a day (BID) | ORAL | Status: DC

## 2013-02-27 MED ORDER — CEFTRIAXONE SODIUM 1 G IJ SOLR
500.0000 mg | Freq: Once | INTRAMUSCULAR | Status: AC
  Administered 2013-02-27: 500 mg via INTRAMUSCULAR

## 2013-02-27 MED ORDER — FLUTICASONE PROPIONATE 50 MCG/ACT NA SUSP: 2.0000 | Freq: Every day | NASAL | Status: DC

## 2013-02-27 NOTE — Progress Notes (Signed)
  Subjective:    Patient ID: Lance [name], male    DOB: February 06, 1946, 67 y.o.   MRN: [id number]  Shortness of Breath This is a new problem. The current episode started 1 to 4 weeks ago. The problem occurs constantly. The problem has been gradually worsening. Nothing aggravates the symptoms. Associated symptoms comments: Sweating, weakness. The patient has no known risk factors for DVT/PE. Treatments tried: antibiotics. The treatment provided no relief.  not sleeping, low fever, headache, nasal symptoms Feeling tight No wheeze, patient relates a lot of head congestion pressure in the head coughing congestion he denies chest pain denies chest pressure he does state that he feels at times he has to belch or cough things up. He follows up with cardiology on a regular basis. PMH benign, heart disease. Tried 10 days of levaqiuin without help, sweating some Review of Systems  Respiratory: Positive for shortness of breath.        Objective:   Physical Exam  Vitals reviewed. Constitutional: He appears well-developed and well-nourished.  HENT:  Head: Normocephalic and atraumatic.  Right Ear: External ear normal.  Left Ear: External ear normal.  Neck: No tracheal deviation present.  Cardiovascular: Normal rate, regular rhythm and normal heart sounds.   No murmur heard. Pulmonary/Chest: Effort normal and breath sounds normal. No respiratory distress. He has no wheezes.  Lymphadenopathy:    He has no cervical adenopathy.          Assessment & Plan:  Sinusitis-Rocephin IM, a new antibiotic [facility], if progressive troubles or worse followup. Also Flonase send him as well. If ongoing troubles let us know

## 2013-02-28 ENCOUNTER — Encounter: Payer: Self-pay | Admitting: Internal Medicine

## 2013-02-28 ENCOUNTER — Ambulatory Visit (INDEPENDENT_AMBULATORY_CARE_PROVIDER_SITE_OTHER): Payer: Medicare Other | Admitting: Internal Medicine

## 2013-02-28 VITALS — BP 112/72 | HR 94 | Ht 69.0 in | Wt 212.0 lb

## 2013-02-28 DIAGNOSIS — I428 Other cardiomyopathies: Secondary | ICD-10-CM | POA: Diagnosis not present

## 2013-02-28 DIAGNOSIS — Z4502 Encounter for adjustment and management of automatic implantable cardiac defibrillator: Secondary | ICD-10-CM | POA: Diagnosis not present

## 2013-02-28 DIAGNOSIS — I2510 Atherosclerotic heart disease of native coronary artery without angina pectoris: Secondary | ICD-10-CM | POA: Diagnosis not present

## 2013-02-28 DIAGNOSIS — I4901 Ventricular fibrillation: Secondary | ICD-10-CM | POA: Insufficient documentation

## 2013-02-28 DIAGNOSIS — I509 Heart failure, unspecified: Secondary | ICD-10-CM | POA: Diagnosis not present

## 2013-02-28 LAB — ICD DEVICE OBSERVATION
AL AMPLITUDE: 2.9 mv
AL IMPEDENCE ICD: 437.5 Ohm
AL THRESHOLD: 1 V
ATRIAL PACING ICD: 0 pct
BAMS-0001: 180 {beats}/min
DEV-0020ICD: NEGATIVE
DEVICE MODEL ICD: 7041246
FVT: 0
HV IMPEDENCE: 41 Ohm
MODE SWITCH EPISODES: 0
PACEART VT: 0
RV LEAD AMPLITUDE: 11.3 mv
RV LEAD IMPEDENCE ICD: 350 Ohm
RV LEAD THRESHOLD: 0.75 V
TOT-0006: 20130627000000
TOT-0007: 1
TOT-0008: 0
TOT-0009: 0
TOT-0010: 1
TZAT-0001SLOWVT: 1
TZAT-0004SLOWVT: 8
TZAT-0012SLOWVT: 200 ms
TZAT-0013SLOWVT: 2
TZAT-0018SLOWVT: NEGATIVE
TZAT-0019SLOWVT: 7.5 V
TZAT-0020SLOWVT: 1 ms
TZON-0003SLOWVT: 320 ms
TZON-0004SLOWVT: 30
TZON-0005SLOWVT: 6
TZON-0010SLOWVT: 40 ms
TZST-0001SLOWVT: 2
TZST-0001SLOWVT: 3
TZST-0001SLOWVT: 4
TZST-0003SLOWVT: 30 J
TZST-0003SLOWVT: 36 J
VENTRICULAR PACING ICD: 0.07 pct
VF: 0

## 2013-02-28 NOTE — Assessment & Plan Note (Signed)
His [name] single-chamber ICD is working normally. We'll plan to recheck in several months. 

## 2013-02-28 NOTE — Assessment & Plan Note (Signed)
The patient denies anginal symptoms. He remains very active, working in his garden and yard.

## 2013-02-28 NOTE — Assessment & Plan Note (Signed)
Interrogation of his ICD demonstrates no recurrent ventricular arrhythmias. He will continue his current medical therapy and undergo watchful waiting.
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
## 2013-02-28 NOTE — Patient Instructions (Signed)
Your physician recommends that you schedule a follow-up appointment in: 1 year with Dr Ladona Ridgel and Merlin transmission on Jan 29.

## 2013-03-02 ENCOUNTER — Encounter: Payer: Self-pay | Admitting: Internal Medicine

## 2013-03-06 ENCOUNTER — Other Ambulatory Visit: Payer: Self-pay | Admitting: Cardiovascular Disease

## 2013-03-07 ENCOUNTER — Ambulatory Visit: Payer: Medicare Other | Admitting: Gastroenterology

## 2013-03-12 ENCOUNTER — Encounter: Payer: Self-pay | Admitting: Gastroenterology

## 2013-03-12 ENCOUNTER — Ambulatory Visit (INDEPENDENT_AMBULATORY_CARE_PROVIDER_SITE_OTHER): Payer: Medicare Other | Admitting: Gastroenterology

## 2013-03-12 VITALS — BP 168/87 | HR 86 | Temp 98.2°F | Wt 219.2 lb

## 2013-03-12 DIAGNOSIS — K219 Gastro-esophageal reflux disease without esophagitis: Secondary | ICD-10-CM

## 2013-03-12 DIAGNOSIS — R110 Nausea: Secondary | ICD-10-CM

## 2013-03-12 MED ORDER — ONDANSETRON HCL 4 MG PO TABS: 4.0000 mg | ORAL_TABLET | Freq: Three times a day (TID) | ORAL | Status: DC | PRN
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
## 2013-03-12 NOTE — Assessment & Plan Note (Signed)
Severe GERD despite Dexilant BID. Pepcid causes loose stool. EGD up-to-date. Concern for underlying delayed gastric emptying, specifically with his reports of early satiety, nausea, and regurgitation. Update GES now. Continue Dexilant BID. Add Zantac instead of Pepcid. Return in 6 weeks. Consider tertiary referral if persistent severe GERD.

## 2013-03-12 NOTE — Patient Instructions (Signed)
Continue Dexilant twice a day. I have sent nausea medicine called Zofran to your pharmacy.  I have also ordered a gastric emptying study to see how well your stomach empties.   We will see you back in 6 weeks.

## 2013-03-13 NOTE — Progress Notes (Signed)
cc'd to pcp 

## 2013-03-21 ENCOUNTER — Encounter: Payer: Self-pay | Admitting: General Practice

## 2013-03-26 ENCOUNTER — Encounter (HOSPITAL_COMMUNITY): Payer: Self-pay

## 2013-03-26 ENCOUNTER — Other Ambulatory Visit: Payer: Self-pay | Admitting: Family Medicine

## 2013-03-26 ENCOUNTER — Encounter (HOSPITAL_COMMUNITY)
Admission: RE | Admit: 2013-03-26 | Discharge: 2013-03-26 | Disposition: A | Discharge status: Home / Self Care | Payer: Medicare Other | Source: Ambulatory Visit | Attending: Gastroenterology | Admitting: Gastroenterology

## 2013-03-26 DIAGNOSIS — R142 Eructation: Secondary | ICD-10-CM | POA: Insufficient documentation

## 2013-03-26 DIAGNOSIS — K219 Gastro-esophageal reflux disease without esophagitis: Secondary | ICD-10-CM | POA: Insufficient documentation

## 2013-03-26 DIAGNOSIS — R141 Gas pain: Secondary | ICD-10-CM | POA: Diagnosis not present

## 2013-03-26 DIAGNOSIS — E119 Type 2 diabetes mellitus without complications: Secondary | ICD-10-CM | POA: Insufficient documentation

## 2013-03-26 DIAGNOSIS — R110 Nausea: Secondary | ICD-10-CM | POA: Insufficient documentation

## 2013-03-26 DIAGNOSIS — R143 Flatulence: Secondary | ICD-10-CM | POA: Diagnosis not present

## 2013-03-26 DIAGNOSIS — R109 Unspecified abdominal pain: Secondary | ICD-10-CM | POA: Insufficient documentation

## 2013-03-26 MED ORDER — TECHNETIUM TC 99M SULFUR COLLOID
2.0000 | Freq: Once | INTRAVENOUS | Status: AC | PRN
  Administered 2013-03-26: 2 via ORAL

## 2013-04-03 DIAGNOSIS — B351 Tinea unguium: Secondary | ICD-10-CM | POA: Diagnosis not present

## 2013-04-03 DIAGNOSIS — E1149 Type 2 diabetes mellitus with other diabetic neurological complication: Secondary | ICD-10-CM | POA: Diagnosis not present

## 2013-04-05 DIAGNOSIS — M542 Cervicalgia: Secondary | ICD-10-CM | POA: Diagnosis not present

## 2013-04-06 ENCOUNTER — Telehealth (INDEPENDENT_AMBULATORY_CARE_PROVIDER_SITE_OTHER): Payer: Self-pay | Admitting: *Deleted

## 2013-04-06 NOTE — Telephone Encounter (Signed)
Needs to cancel office appointment for 05/04/2013.  He has a cardiology appt that day and will need to reschedule.

## 2013-04-09 ENCOUNTER — Encounter: Payer: Self-pay | Admitting: Internal Medicine

## 2013-04-10 ENCOUNTER — Ambulatory Visit (INDEPENDENT_AMBULATORY_CARE_PROVIDER_SITE_OTHER): Payer: Medicare Other | Admitting: Family Medicine

## 2013-04-10 ENCOUNTER — Encounter: Payer: Self-pay | Admitting: Family Medicine

## 2013-04-10 VITALS — BP 128/70 | Temp 99.3°F | Ht 69.0 in | Wt 218.5 lb

## 2013-04-10 DIAGNOSIS — R059 Cough, unspecified: Secondary | ICD-10-CM

## 2013-04-10 DIAGNOSIS — R05 Cough: Secondary | ICD-10-CM

## 2013-04-10 DIAGNOSIS — J209 Acute bronchitis, unspecified: Secondary | ICD-10-CM

## 2013-04-10 MED ORDER — LEVOFLOXACIN 500 MG PO TABS: 500.0000 mg | ORAL_TABLET | Freq: Every day | ORAL | Status: DC

## 2013-04-10 MED ORDER — CEFTRIAXONE SODIUM 1 G IJ SOLR
1.0000 g | Freq: Once | INTRAMUSCULAR | Status: AC
  Administered 2013-04-10: 1 g via INTRAMUSCULAR

## 2013-04-10 NOTE — Progress Notes (Signed)
   Subjective:    Patient ID: Lance [name], male    DOB: [birth year], 67 y.o.   MRN: [id number]  Cough This is a new problem. The current episode started in the past 7 days. The problem has been unchanged. The cough is productive of sputum. Associated symptoms include a fever, myalgias, shortness of breath and wheezing. Nothing aggravates the symptoms. Treatments tried: antibiotics in past. The treatment provided no relief.   oongoing cough and congestion, no chest pain.   Cough brings up phlegm.  Diminished energy   Review of Systems  Constitutional: Positive for fever.  Respiratory: Positive for cough, shortness of breath and wheezing.   Musculoskeletal: Positive for myalgias.       Objective:   Physical Exam  Alert mild malaise. HEENT mom his congestion pharynx normal temp 99.3 bronchial cough no tachypnea no inspiratory crackles no wheezes heart regular in rhythm      Assessment & Plan:  Impression acute bronchitis however with deep cough will treat as if potential early pneumonia plan Rocephin injection. Initiate Levaquin 500 mg daily. Followup with Dr. Lorin Picket next week. Patient stated he would like to see a pulmonary doctor at 1 point. WSL

## 2013-04-10 NOTE — Telephone Encounter (Signed)
OV cancelled and RSC with RMR on 1/6

## 2013-04-10 NOTE — Progress Notes (Signed)
Quick Note:  Normal GES.  How is he doing with Zofran? Needs to keep appt with Dr. Jena Gauss upcoming. Needs to follow strict GERD precautions. May need to send him a handout on this. ______

## 2013-04-12 ENCOUNTER — Other Ambulatory Visit: Payer: Self-pay | Admitting: Family Medicine

## 2013-04-12 ENCOUNTER — Telehealth: Payer: Self-pay | Admitting: Family Medicine

## 2013-04-12 MED ORDER — ONDANSETRON HCL 8 MG PO TABS: 8.0000 mg | ORAL_TABLET | Freq: Three times a day (TID) | ORAL | Status: DC | PRN

## 2013-04-12 MED ORDER — SULFAMETHOXAZOLE-TRIMETHOPRIM 800-160 MG PO TABS: 1.0000 | ORAL_TABLET | Freq: Two times a day (BID) | ORAL | Status: DC

## 2013-04-12 NOTE — Telephone Encounter (Signed)
levofloxacin (LEVAQUIN) 500 MG tablet  Pt is itching terribly, since starting the antibiotic Has now starting vomiting, fever still present Coughing with lots of flem No appetite  Call anything new to [facility]  Was seen 04/10/13

## 2013-04-12 NOTE — Telephone Encounter (Signed)
I spoke with the patient's wife. Pneumatic called in. All medicine discontinued and added to allergy list. Bactrim called in. Also if ongoing troubles to followup.

## 2013-04-19 ENCOUNTER — Ambulatory Visit (INDEPENDENT_AMBULATORY_CARE_PROVIDER_SITE_OTHER): Payer: Medicare Other | Admitting: Family Medicine

## 2013-04-19 ENCOUNTER — Encounter: Payer: Self-pay | Admitting: Family Medicine

## 2013-04-19 VITALS — BP 122/80 | Ht 69.0 in | Wt 214.0 lb

## 2013-04-19 DIAGNOSIS — R0600 Dyspnea, unspecified: Secondary | ICD-10-CM

## 2013-04-19 DIAGNOSIS — R259 Unspecified abnormal involuntary movements: Secondary | ICD-10-CM | POA: Diagnosis not present

## 2013-04-19 DIAGNOSIS — G459 Transient cerebral ischemic attack, unspecified: Secondary | ICD-10-CM | POA: Diagnosis not present

## 2013-04-19 DIAGNOSIS — R0609 Other forms of dyspnea: Secondary | ICD-10-CM

## 2013-04-19 DIAGNOSIS — R0989 Other specified symptoms and signs involving the circulatory and respiratory systems: Secondary | ICD-10-CM | POA: Diagnosis not present

## 2013-04-19 DIAGNOSIS — R251 Tremor, unspecified: Secondary | ICD-10-CM

## 2013-04-19 NOTE — Progress Notes (Signed)
   Subjective:    Patient ID: Lance [name], male    DOB: 1945-10-27, 67 y.o.   MRN: [id number]  HPI Patient is here today for a f/u from 12/9 due to cough. He was treated with antibiotics he states he's now doing better. Breathing better. Denies any chest tightness pressure pain he does get short of breath with activity  He states he is feeling better. He still has one more day left of the medication that was prescribed.   Tremors he gets these more so in the mid afternoon not so much in the morning or evening no personal history or family history of Parkinson's  Patient also had a couple different episodes of aphasia and a history of stroke. These couple episodes of aphasia were brief PMH benign family history benign    Review of Systems     Objective:   Physical Exam  Constitutional: He appears well-developed and well-nourished.  HENT:  Head: Normocephalic and atraumatic.  Right Ear: External ear normal.  Left Ear: External ear normal.  Nose: Nose normal.  Mouth/Throat: Oropharynx is clear and moist.  Neck: Normal range of motion. Neck supple. No thyromegaly present.  Cardiovascular: Normal rate, regular rhythm and normal heart sounds.   No murmur heard. Pulmonary/Chest: Effort normal and breath sounds normal. No respiratory distress. He has no wheezes.  Abdominal: Soft. Bowel sounds are normal. He exhibits no distension and no mass. There is no tenderness.  Genitourinary: Penis normal.  Musculoskeletal: Normal range of motion. He exhibits no edema.  Lymphadenopathy:    He has no cervical adenopathy.  Neurological: He is alert. He exhibits normal muscle tone.  Skin: Skin is warm and dry. No erythema.  Psychiatric: He has a normal mood and affect. His behavior is normal. Judgment normal.   Finger to nose normal Romberg negative       Assessment & Plan:  PFT-we will go ahead and set up pulmonary function testing. I believe this patient would benefit from having an  updated one. More than likely will also need pulmonology consult.  Pul consult. Patient desires pulmonary consult. S. long history of smoking. Also has underlying history of heart disease. Recently he with flareup of bronchitis. Patient states he gets short of breath with mild activity.  Tremors -patient relates tremors seem to be worse with the middle of the day activity it's not is present in the morning or in the evening no balance issues with it. No muscle stiffness. I find no evidence of Parkinson's on today's exam.  aphasia-intermittent aphasia, this patient's had strokes. It is concerning for the possibility of a threatened stroke. He is already on a anti-stroke measures. I would recommend doing further testing. I did speak with radiology and discussed his case they recommended carotid ultrasounds as well as MRI of the brain with MRA. But because of his heart disease and having ICD we would need to do carotid Dopplers first. Then may need to progress towards CT scan with CT angiography.

## 2013-04-24 ENCOUNTER — Other Ambulatory Visit: Payer: Self-pay | Admitting: *Deleted

## 2013-04-24 DIAGNOSIS — Z8673 Personal history of transient ischemic attack (TIA), and cerebral infarction without residual deficits: Secondary | ICD-10-CM

## 2013-04-24 NOTE — Telephone Encounter (Signed)
Patient has scheduled carotid U/S scheduled for Mon. Dec 29 at 8:45am. Pt verbalized understanding.

## 2013-04-30 ENCOUNTER — Ambulatory Visit (HOSPITAL_COMMUNITY)
Admission: RE | Admit: 2013-04-30 | Discharge: 2013-04-30 | Disposition: A | Discharge status: Home / Self Care | Payer: Medicare Other | Source: Ambulatory Visit | Attending: Family Medicine | Admitting: Family Medicine

## 2013-04-30 DIAGNOSIS — I6350 Cerebral infarction due to unspecified occlusion or stenosis of unspecified cerebral artery: Secondary | ICD-10-CM | POA: Diagnosis not present

## 2013-04-30 DIAGNOSIS — Z8673 Personal history of transient ischemic attack (TIA), and cerebral infarction without residual deficits: Secondary | ICD-10-CM

## 2013-05-04 ENCOUNTER — Ambulatory Visit: Payer: Medicare Other | Admitting: Internal Medicine

## 2013-05-08 ENCOUNTER — Encounter: Payer: Self-pay | Admitting: Internal Medicine

## 2013-05-08 ENCOUNTER — Ambulatory Visit (INDEPENDENT_AMBULATORY_CARE_PROVIDER_SITE_OTHER): Payer: Medicare Other | Admitting: Internal Medicine

## 2013-05-08 VITALS — BP 162/94 | HR 83 | Temp 97.3°F | Wt 220.2 lb

## 2013-05-08 DIAGNOSIS — K219 Gastro-esophageal reflux disease without esophagitis: Secondary | ICD-10-CM | POA: Diagnosis not present

## 2013-05-08 NOTE — Patient Instructions (Signed)
Continue Dexilant twice daily  Loose 15 pounds this year  GERD information  Office visit here in 6 months
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
## 2013-05-10 DIAGNOSIS — E559 Vitamin D deficiency, unspecified: Secondary | ICD-10-CM | POA: Diagnosis not present

## 2013-05-10 DIAGNOSIS — IMO0001 Reserved for inherently not codable concepts without codable children: Secondary | ICD-10-CM | POA: Diagnosis not present

## 2013-05-10 DIAGNOSIS — E785 Hyperlipidemia, unspecified: Secondary | ICD-10-CM | POA: Diagnosis not present

## 2013-05-10 DIAGNOSIS — I10 Essential (primary) hypertension: Secondary | ICD-10-CM | POA: Diagnosis not present

## 2013-05-11 DIAGNOSIS — M542 Cervicalgia: Secondary | ICD-10-CM | POA: Diagnosis not present

## 2013-05-18 ENCOUNTER — Other Ambulatory Visit: Payer: Self-pay | Admitting: Family Medicine

## 2013-05-18 ENCOUNTER — Telehealth: Payer: Self-pay | Admitting: Cardiology

## 2013-05-18 MED ORDER — SIMVASTATIN 20 MG PO TABS: ORAL_TABLET | ORAL | Status: DC

## 2013-05-18 NOTE — Telephone Encounter (Signed)
May give this refill +5 additional

## 2013-05-18 NOTE — Telephone Encounter (Signed)
Received fax refill request  Rx # [id number] Medication:  Simvastatin 20 mg tab Qty 30 Sig:  Take one tablet by mouth every day at 6 pm Physician:  Harl Bowie

## 2013-05-18 NOTE — Telephone Encounter (Signed)
Medication sent via escribe.  

## 2013-05-21 ENCOUNTER — Telehealth: Payer: Self-pay | Admitting: Cardiology

## 2013-05-21 MED ORDER — SIMVASTATIN 20 MG PO TABS: ORAL_TABLET | ORAL | Status: DC

## 2013-05-21 NOTE — Telephone Encounter (Signed)
Received fax refill request  Rx # [id number] Medication:  Simvastatin 20 mg tab Qty 30 Sig:  Take one tablet by mouth every day at bed time Physician:  Harl Bowie

## 2013-05-25 ENCOUNTER — Telehealth: Payer: Self-pay | Admitting: Cardiology

## 2013-05-25 MED ORDER — SIMVASTATIN 20 MG PO TABS: ORAL_TABLET | ORAL | Status: DC

## 2013-05-25 NOTE — Telephone Encounter (Signed)
Medication sent via escribe.  

## 2013-05-25 NOTE — Telephone Encounter (Signed)
Received fax refill request ° °Rx # [id number] °Medication:  Simvastatin 20 mg tab °Qty 30 °Sig:  Take one tablet by mouth every day at 6 pm °Physician:  Branch ° ° °

## 2013-05-28 ENCOUNTER — Telehealth: Payer: Self-pay | Admitting: Cardiology

## 2013-05-28 MED ORDER — SIMVASTATIN 20 MG PO TABS: ORAL_TABLET | ORAL | Status: DC

## 2013-05-28 NOTE — Telephone Encounter (Signed)
Received fax refill request ° °Rx # [id number] °Medication:  Simvastatin 20 mg tab °Qty 30 °Sig:  Take one tablet by mouth every day at 6 pm °Physician:  Branch ° ° °

## 2013-05-28 NOTE — Telephone Encounter (Signed)
Medication sent via escribe.  

## 2013-05-29 ENCOUNTER — Telehealth: Payer: Self-pay | Admitting: Cardiology

## 2013-05-29 MED ORDER — SIMVASTATIN 20 MG PO TABS: ORAL_TABLET | ORAL | Status: DC

## 2013-05-29 NOTE — Telephone Encounter (Signed)
Received fax refill request  Rx # [id number] Medication:  Simvastatin 20 mg tab Qty 30 Sig:  Take one tablet by mouth every day at 6 pm Physician: Verl Blalock

## 2013-05-29 NOTE — Telephone Encounter (Signed)
rx refilled.

## 2013-05-30 ENCOUNTER — Other Ambulatory Visit: Payer: Self-pay | Admitting: Family Medicine

## 2013-05-30 ENCOUNTER — Telehealth: Payer: Self-pay | Admitting: Cardiology

## 2013-05-30 ENCOUNTER — Other Ambulatory Visit: Payer: Self-pay | Admitting: Gastroenterology

## 2013-05-30 NOTE — Telephone Encounter (Signed)
Received fax refill request ° °Rx # [id number] °Medication:  Simvastatin 20 mg tab °Qty 30 °Sig:  Take one tablet by mouth every day at 6 pm °Physician:  Branch ° ° °

## 2013-05-31 ENCOUNTER — Ambulatory Visit (INDEPENDENT_AMBULATORY_CARE_PROVIDER_SITE_OTHER): Payer: Medicare Other | Admitting: *Deleted

## 2013-05-31 ENCOUNTER — Encounter: Payer: Self-pay | Admitting: Internal Medicine

## 2013-05-31 DIAGNOSIS — I4901 Ventricular fibrillation: Secondary | ICD-10-CM

## 2013-05-31 DIAGNOSIS — I509 Heart failure, unspecified: Secondary | ICD-10-CM | POA: Diagnosis not present

## 2013-05-31 DIAGNOSIS — I428 Other cardiomyopathies: Secondary | ICD-10-CM | POA: Diagnosis not present

## 2013-05-31 LAB — MDC_IDC_ENUM_SESS_TYPE_REMOTE
Battery Remaining Longevity: 82 mo
Battery Remaining Percentage: 83 %
Battery Voltage: 3.01 V
Brady Statistic AP VP Percent: 1 %
Brady Statistic AP VS Percent: 1 %
Brady Statistic AS VP Percent: 1 %
Brady Statistic AS VS Percent: 99 %
Brady Statistic RA Percent Paced: 1 %
Brady Statistic RV Percent Paced: 1 %
Date Time Interrogation Session: 20150129090019
HighPow Impedance: 39 Ohm
Implantable Pulse Generator Serial Number: 7041246
Lead Channel Impedance Value: 380 Ohm
Lead Channel Impedance Value: 440 Ohm
Lead Channel Pacing Threshold Amplitude: 0.75 V
Lead Channel Pacing Threshold Amplitude: 1 V
Lead Channel Pacing Threshold Pulse Width: 0.5 ms
Lead Channel Pacing Threshold Pulse Width: 0.5 ms
Lead Channel Sensing Intrinsic Amplitude: 11.3 mV
Lead Channel Sensing Intrinsic Amplitude: 4.7 mV
Lead Channel Setting Pacing Amplitude: 2 V
Lead Channel Setting Pacing Amplitude: 2.5 V
Lead Channel Setting Pacing Pulse Width: 0.5 ms
Lead Channel Setting Sensing Sensitivity: 0.5 mV
Zone Setting Detection Interval: 280 ms
Zone Setting Detection Interval: 320 ms

## 2013-06-07 ENCOUNTER — Other Ambulatory Visit: Payer: Self-pay | Admitting: Neurology

## 2013-06-07 NOTE — Telephone Encounter (Signed)
Former [facility] patient assigned to Dr Jannifer Franklin; Has appt scheduled

## 2013-06-12 DIAGNOSIS — B351 Tinea unguium: Secondary | ICD-10-CM | POA: Diagnosis not present

## 2013-06-12 DIAGNOSIS — L851 Acquired keratosis [keratoderma] palmaris et plantaris: Secondary | ICD-10-CM | POA: Diagnosis not present

## 2013-06-19 ENCOUNTER — Other Ambulatory Visit: Payer: Self-pay | Admitting: Family Medicine

## 2013-06-26 ENCOUNTER — Encounter: Payer: Self-pay | Admitting: *Deleted

## 2013-06-26 ENCOUNTER — Other Ambulatory Visit: Payer: Self-pay

## 2013-06-26 MED ORDER — SILDENAFIL CITRATE 20 MG PO TABS: ORAL_TABLET | ORAL | Status: DC

## 2013-06-29 ENCOUNTER — Ambulatory Visit: Payer: Medicare Other | Admitting: Neurology

## 2013-07-04 ENCOUNTER — Telehealth: Payer: Self-pay | Admitting: Family Medicine

## 2013-07-04 NOTE — Telephone Encounter (Signed)
Unfortunately there is no way around this. If he buys the medication without any insurance he can get for 5 tablets for approximately $10-$15 which is cheaper than Viagra tablets 100 mg. I don't believe there is any way around the quantity limits. He may consider getting some through his insurance and pain for some out-of-pocket. I don't believe any appeal would work for this thank you.

## 2013-07-04 NOTE — Telephone Encounter (Signed)
Received letter from [facility], pt's SILDENAFIL 20mg  tablet has a daily quantity limit of 3 tablets a day, if need more need to send in letter of medical necessity, please see letter on front of paper chart, please advise

## 2013-07-08 ENCOUNTER — Other Ambulatory Visit: Payer: Self-pay | Admitting: Family Medicine

## 2013-07-10 DIAGNOSIS — M5030 Other cervical disc degeneration, unspecified cervical region: Secondary | ICD-10-CM | POA: Diagnosis not present

## 2013-07-10 DIAGNOSIS — M542 Cervicalgia: Secondary | ICD-10-CM | POA: Diagnosis not present

## 2013-07-13 ENCOUNTER — Other Ambulatory Visit: Payer: Self-pay | Admitting: Neurology

## 2013-07-13 NOTE — Telephone Encounter (Signed)
Has appt in August

## 2013-07-19 ENCOUNTER — Telehealth: Payer: Self-pay

## 2013-07-19 ENCOUNTER — Other Ambulatory Visit: Payer: Self-pay

## 2013-07-19 DIAGNOSIS — I2510 Atherosclerotic heart disease of native coronary artery without angina pectoris: Secondary | ICD-10-CM

## 2013-07-19 NOTE — Telephone Encounter (Signed)
Message copied by Bernita Raisin on Thu Jul 19, 2013 11:16 AM ------      Message from: Lestine Mount      Created: Wed Jul 18, 2013 11:11 AM      Regarding: LABS       PT HAS NOT HAD LABS DONE BY PCP PLEASE MAKE SURE ORDERS ARE IN FOR THEM.            HE HAS F/U APPT WITH DR [facility] 08/10/13 ------

## 2013-07-19 NOTE — Telephone Encounter (Signed)
Spoke with patient,will have labs done before visit with Dr.Branch

## 2013-07-20 ENCOUNTER — Encounter: Payer: Self-pay | Admitting: Family Medicine

## 2013-07-20 ENCOUNTER — Other Ambulatory Visit: Payer: Self-pay | Admitting: Family Medicine

## 2013-07-20 NOTE — Telephone Encounter (Signed)
Mailed letter to pt explaining options

## 2013-07-25 DIAGNOSIS — I2510 Atherosclerotic heart disease of native coronary artery without angina pectoris: Secondary | ICD-10-CM | POA: Diagnosis not present

## 2013-07-25 DIAGNOSIS — I7090 Unspecified atherosclerosis: Secondary | ICD-10-CM | POA: Diagnosis not present

## 2013-07-25 LAB — HEPATIC FUNCTION PANEL
ALT: 15 U/L (ref 0–53)
AST: 14 U/L (ref 0–37)
Albumin: 4.1 g/dL (ref 3.5–5.2)
Alkaline Phosphatase: 55 U/L (ref 39–117)
Bilirubin, Direct: 0.1 mg/dL (ref 0.0–0.3)
Indirect Bilirubin: 0.3 mg/dL (ref 0.2–1.2)
Total Bilirubin: 0.4 mg/dL (ref 0.2–1.2)
Total Protein: 6.4 g/dL (ref 6.0–8.3)

## 2013-07-25 LAB — BASIC METABOLIC PANEL
BUN: 15 mg/dL (ref 6–23)
CO2: 27 mEq/L (ref 19–32)
Calcium: 9.5 mg/dL (ref 8.4–10.5)
Chloride: 101 mEq/L (ref 96–112)
Creat: 1.02 mg/dL (ref 0.50–1.35)
Glucose, Bld: 176 mg/dL — ABNORMAL HIGH (ref 70–99)
Potassium: 4.1 mEq/L (ref 3.5–5.3)
Sodium: 138 mEq/L (ref 135–145)

## 2013-07-25 LAB — LIPID PANEL
Cholesterol: 154 mg/dL (ref 0–200)
HDL: 31 mg/dL — ABNORMAL LOW (ref >39)
LDL Cholesterol: 92 mg/dL (ref 0–99)
Total CHOL/HDL Ratio: 5 Ratio
Triglycerides: 155 mg/dL — ABNORMAL HIGH (ref <150)
VLDL: 31 mg/dL (ref 0–40)

## 2013-08-02 DIAGNOSIS — E785 Hyperlipidemia, unspecified: Secondary | ICD-10-CM | POA: Diagnosis not present

## 2013-08-02 DIAGNOSIS — I10 Essential (primary) hypertension: Secondary | ICD-10-CM | POA: Diagnosis not present

## 2013-08-02 DIAGNOSIS — IMO0001 Reserved for inherently not codable concepts without codable children: Secondary | ICD-10-CM | POA: Diagnosis not present

## 2013-08-02 DIAGNOSIS — E559 Vitamin D deficiency, unspecified: Secondary | ICD-10-CM | POA: Diagnosis not present

## 2013-08-06 ENCOUNTER — Encounter (HOSPITAL_COMMUNITY): Payer: Self-pay | Admitting: Emergency Medicine

## 2013-08-06 ENCOUNTER — Observation Stay (HOSPITAL_COMMUNITY)
Admission: EM | Admit: 2013-08-06 | Discharge: 2013-08-07 | Disposition: A | Discharge status: Home / Self Care | Payer: Medicare Other | Attending: Internal Medicine | Admitting: Internal Medicine

## 2013-08-06 DIAGNOSIS — E291 Testicular hypofunction: Secondary | ICD-10-CM | POA: Diagnosis not present

## 2013-08-06 DIAGNOSIS — I7090 Unspecified atherosclerosis: Secondary | ICD-10-CM | POA: Insufficient documentation

## 2013-08-06 DIAGNOSIS — F329 Major depressive disorder, single episode, unspecified: Secondary | ICD-10-CM | POA: Diagnosis not present

## 2013-08-06 DIAGNOSIS — Z951 Presence of aortocoronary bypass graft: Secondary | ICD-10-CM | POA: Diagnosis not present

## 2013-08-06 DIAGNOSIS — E785 Hyperlipidemia, unspecified: Secondary | ICD-10-CM | POA: Diagnosis not present

## 2013-08-06 DIAGNOSIS — Z888 Allergy status to other drugs, medicaments and biological substances status: Secondary | ICD-10-CM | POA: Insufficient documentation

## 2013-08-06 DIAGNOSIS — Z87891 Personal history of nicotine dependence: Secondary | ICD-10-CM | POA: Diagnosis not present

## 2013-08-06 DIAGNOSIS — F411 Generalized anxiety disorder: Secondary | ICD-10-CM | POA: Insufficient documentation

## 2013-08-06 DIAGNOSIS — E119 Type 2 diabetes mellitus without complications: Secondary | ICD-10-CM

## 2013-08-06 DIAGNOSIS — F3289 Other specified depressive episodes: Secondary | ICD-10-CM

## 2013-08-06 DIAGNOSIS — I951 Orthostatic hypotension: Secondary | ICD-10-CM

## 2013-08-06 DIAGNOSIS — K219 Gastro-esophageal reflux disease without esophagitis: Secondary | ICD-10-CM | POA: Diagnosis not present

## 2013-08-06 DIAGNOSIS — E1159 Type 2 diabetes mellitus with other circulatory complications: Secondary | ICD-10-CM | POA: Diagnosis present

## 2013-08-06 DIAGNOSIS — Z79899 Other long term (current) drug therapy: Secondary | ICD-10-CM | POA: Insufficient documentation

## 2013-08-06 DIAGNOSIS — J4489 Other specified chronic obstructive pulmonary disease: Secondary | ICD-10-CM | POA: Insufficient documentation

## 2013-08-06 DIAGNOSIS — R197 Diarrhea, unspecified: Secondary | ICD-10-CM

## 2013-08-06 DIAGNOSIS — I509 Heart failure, unspecified: Secondary | ICD-10-CM

## 2013-08-06 DIAGNOSIS — Z880 Allergy status to penicillin: Secondary | ICD-10-CM | POA: Diagnosis not present

## 2013-08-06 DIAGNOSIS — J309 Allergic rhinitis, unspecified: Secondary | ICD-10-CM

## 2013-08-06 DIAGNOSIS — Z8673 Personal history of transient ischemic attack (TIA), and cerebral infarction without residual deficits: Secondary | ICD-10-CM | POA: Diagnosis not present

## 2013-08-06 DIAGNOSIS — G459 Transient cerebral ischemic attack, unspecified: Secondary | ICD-10-CM

## 2013-08-06 DIAGNOSIS — Z87898 Personal history of other specified conditions: Secondary | ICD-10-CM | POA: Diagnosis not present

## 2013-08-06 DIAGNOSIS — I4901 Ventricular fibrillation: Secondary | ICD-10-CM

## 2013-08-06 DIAGNOSIS — I2510 Atherosclerotic heart disease of native coronary artery without angina pectoris: Secondary | ICD-10-CM

## 2013-08-06 DIAGNOSIS — H748X9 Other specified disorders of middle ear and mastoid, unspecified ear: Secondary | ICD-10-CM | POA: Diagnosis not present

## 2013-08-06 DIAGNOSIS — N400 Enlarged prostate without lower urinary tract symptoms: Secondary | ICD-10-CM | POA: Diagnosis not present

## 2013-08-06 DIAGNOSIS — R42 Dizziness and giddiness: Secondary | ICD-10-CM | POA: Diagnosis not present

## 2013-08-06 DIAGNOSIS — J449 Chronic obstructive pulmonary disease, unspecified: Secondary | ICD-10-CM | POA: Diagnosis not present

## 2013-08-06 DIAGNOSIS — R112 Nausea with vomiting, unspecified: Secondary | ICD-10-CM | POA: Insufficient documentation

## 2013-08-06 DIAGNOSIS — N529 Male erectile dysfunction, unspecified: Secondary | ICD-10-CM

## 2013-08-06 DIAGNOSIS — R1033 Periumbilical pain: Secondary | ICD-10-CM

## 2013-08-06 DIAGNOSIS — I10 Essential (primary) hypertension: Secondary | ICD-10-CM | POA: Diagnosis not present

## 2013-08-06 DIAGNOSIS — F17200 Nicotine dependence, unspecified, uncomplicated: Secondary | ICD-10-CM

## 2013-08-06 DIAGNOSIS — E230 Hypopituitarism: Secondary | ICD-10-CM

## 2013-08-06 DIAGNOSIS — Z7982 Long term (current) use of aspirin: Secondary | ICD-10-CM | POA: Insufficient documentation

## 2013-08-06 DIAGNOSIS — R0600 Dyspnea, unspecified: Secondary | ICD-10-CM

## 2013-08-06 DIAGNOSIS — L301 Dyshidrosis [pompholyx]: Secondary | ICD-10-CM | POA: Diagnosis not present

## 2013-08-06 DIAGNOSIS — J45909 Unspecified asthma, uncomplicated: Secondary | ICD-10-CM

## 2013-08-06 DIAGNOSIS — Z95810 Presence of automatic (implantable) cardiac defibrillator: Secondary | ICD-10-CM

## 2013-08-06 DIAGNOSIS — M1990 Unspecified osteoarthritis, unspecified site: Secondary | ICD-10-CM | POA: Diagnosis not present

## 2013-08-06 DIAGNOSIS — R55 Syncope and collapse: Secondary | ICD-10-CM

## 2013-08-06 DIAGNOSIS — R110 Nausea: Secondary | ICD-10-CM

## 2013-08-06 DIAGNOSIS — R079 Chest pain, unspecified: Secondary | ICD-10-CM

## 2013-08-06 DIAGNOSIS — Z794 Long term (current) use of insulin: Secondary | ICD-10-CM | POA: Insufficient documentation

## 2013-08-06 DIAGNOSIS — R404 Transient alteration of awareness: Secondary | ICD-10-CM | POA: Diagnosis not present

## 2013-08-06 DIAGNOSIS — Z4502 Encounter for adjustment and management of automatic implantable cardiac defibrillator: Secondary | ICD-10-CM

## 2013-08-06 DIAGNOSIS — R109 Unspecified abdominal pain: Secondary | ICD-10-CM

## 2013-08-06 DIAGNOSIS — I428 Other cardiomyopathies: Secondary | ICD-10-CM

## 2013-08-06 HISTORY — DX: Shortness of breath: R06.02

## 2013-08-06 HISTORY — DX: Acute myocardial infarction, unspecified: I21.9

## 2013-08-06 LAB — CBC WITH DIFFERENTIAL/PLATELET
Basophils Absolute: 0 10*3/uL (ref 0.0–0.1)
Basophils Relative: 0 % (ref 0–1)
Eosinophils Absolute: 0.2 10*3/uL (ref 0.0–0.7)
Eosinophils Relative: 2 % (ref 0–5)
HCT: 43 % (ref 39.0–52.0)
Hemoglobin: 15.6 g/dL (ref 13.0–17.0)
Lymphocytes Relative: 43 % (ref 12–46)
Lymphs Abs: 3.4 10*3/uL (ref 0.7–4.0)
MCH: 30.6 pg (ref 26.0–34.0)
MCHC: 36.3 g/dL — ABNORMAL HIGH (ref 30.0–36.0)
MCV: 84.3 fL (ref 78.0–100.0)
Monocytes Absolute: 0.7 10*3/uL (ref 0.1–1.0)
Monocytes Relative: 9 % (ref 3–12)
Neutro Abs: 3.7 10*3/uL (ref 1.7–7.7)
Neutrophils Relative %: 46 % (ref 43–77)
Platelets: 157 10*3/uL (ref 150–400)
RBC: 5.1 MIL/uL (ref 4.22–5.81)
RDW: 13.1 % (ref 11.5–15.5)
WBC: 8 10*3/uL (ref 4.0–10.5)

## 2013-08-06 LAB — COMPREHENSIVE METABOLIC PANEL
ALT: 19 U/L (ref 0–53)
AST: 21 U/L (ref 0–37)
Albumin: 4.3 g/dL (ref 3.5–5.2)
Alkaline Phosphatase: 65 U/L (ref 39–117)
BUN: 18 mg/dL (ref 6–23)
CO2: 25 mEq/L (ref 19–32)
Calcium: 10.2 mg/dL (ref 8.4–10.5)
Chloride: 100 mEq/L (ref 96–112)
Creatinine, Ser: 1.03 mg/dL (ref 0.50–1.35)
GFR calc Af Amer: 86 mL/min — ABNORMAL LOW (ref >90)
GFR calc non Af Amer: 74 mL/min — ABNORMAL LOW (ref >90)
Glucose, Bld: 135 mg/dL — ABNORMAL HIGH (ref 70–99)
Potassium: 3.8 mEq/L (ref 3.7–5.3)
Sodium: 143 mEq/L (ref 137–147)
Total Bilirubin: 0.2 mg/dL — ABNORMAL LOW (ref 0.3–1.2)
Total Protein: 7.8 g/dL (ref 6.0–8.3)

## 2013-08-06 LAB — TROPONIN I: Troponin I: <0.3 ng/mL (ref <0.30)

## 2013-08-06 MED ORDER — SODIUM CHLORIDE 0.9 % IV SOLN
Freq: Once | INTRAVENOUS | Status: AC
  Administered 2013-08-06: via INTRAVENOUS

## 2013-08-06 MED ORDER — ONDANSETRON HCL 4 MG/2ML IJ SOLN
4.0000 mg | Freq: Once | INTRAMUSCULAR | Status: AC
  Administered 2013-08-06: 4 mg via INTRAVENOUS
  Filled 2013-08-06: qty 2

## 2013-08-06 NOTE — ED Notes (Signed)
Tube sent downstairs to lab and not kept in minilab, regular troponin ordered.

## 2013-08-06 NOTE — ED Notes (Signed)
Per EMS pt is from home, pt reports he felt OK yesterday and today. Pt reports he took his nighttime medications and went to go to bed. Pt reports he all of sudden started feeling dizzy and nauseous. Pt reports he had a few episodes of vomiting. Pt is diaphoretic upon initial assessment. EMS reports they adm 4mg  of zofran IV with no relief of nausea and that the pt was HTN, 220/160. Pt is A&O X4. Pt has a cardiac hx and had a CABG in 2014, pt also has an implanted defibrillator. Pt gagging upon initial assessment.
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
## 2013-08-06 NOTE — ED Notes (Addendum)
Pt given a urinal to void. Unable to successfully ambulate pt in room, pt stood up from bed and was swaying, took a break and retried, pt unsteady. This RN asked pt to get back into bed as she did not want him to fall.

## 2013-08-07 ENCOUNTER — Inpatient Hospital Stay (HOSPITAL_COMMUNITY): Payer: Medicare Other

## 2013-08-07 ENCOUNTER — Emergency Department (HOSPITAL_COMMUNITY): Payer: Medicare Other

## 2013-08-07 ENCOUNTER — Encounter (HOSPITAL_COMMUNITY): Payer: Self-pay | Admitting: Radiology

## 2013-08-07 DIAGNOSIS — K219 Gastro-esophageal reflux disease without esophagitis: Secondary | ICD-10-CM | POA: Diagnosis not present

## 2013-08-07 DIAGNOSIS — I509 Heart failure, unspecified: Secondary | ICD-10-CM

## 2013-08-07 DIAGNOSIS — H748X9 Other specified disorders of middle ear and mastoid, unspecified ear: Secondary | ICD-10-CM | POA: Diagnosis not present

## 2013-08-07 DIAGNOSIS — J45909 Unspecified asthma, uncomplicated: Secondary | ICD-10-CM

## 2013-08-07 DIAGNOSIS — I2510 Atherosclerotic heart disease of native coronary artery without angina pectoris: Secondary | ICD-10-CM

## 2013-08-07 DIAGNOSIS — Z95810 Presence of automatic (implantable) cardiac defibrillator: Secondary | ICD-10-CM | POA: Diagnosis not present

## 2013-08-07 DIAGNOSIS — I6529 Occlusion and stenosis of unspecified carotid artery: Secondary | ICD-10-CM | POA: Diagnosis not present

## 2013-08-07 DIAGNOSIS — R42 Dizziness and giddiness: Secondary | ICD-10-CM | POA: Diagnosis not present

## 2013-08-07 DIAGNOSIS — G459 Transient cerebral ischemic attack, unspecified: Secondary | ICD-10-CM

## 2013-08-07 DIAGNOSIS — I369 Nonrheumatic tricuspid valve disorder, unspecified: Secondary | ICD-10-CM

## 2013-08-07 DIAGNOSIS — E119 Type 2 diabetes mellitus without complications: Secondary | ICD-10-CM

## 2013-08-07 LAB — URINE MICROSCOPIC-ADD ON

## 2013-08-07 LAB — GLUCOSE, CAPILLARY
Glucose-Capillary: 154 mg/dL — ABNORMAL HIGH (ref 70–99)
Glucose-Capillary: 137 mg/dL — ABNORMAL HIGH (ref 70–99)
Glucose-Capillary: 166 mg/dL — ABNORMAL HIGH (ref 70–99)
Glucose-Capillary: 167 mg/dL — ABNORMAL HIGH (ref 70–99)

## 2013-08-07 LAB — COMPREHENSIVE METABOLIC PANEL
ALT: 16 U/L (ref 0–53)
AST: 18 U/L (ref 0–37)
Albumin: 3.7 g/dL (ref 3.5–5.2)
Alkaline Phosphatase: 60 U/L (ref 39–117)
BUN: 16 mg/dL (ref 6–23)
CO2: 26 mEq/L (ref 19–32)
Calcium: 9.6 mg/dL (ref 8.4–10.5)
Chloride: 99 mEq/L (ref 96–112)
Creatinine, Ser: 0.95 mg/dL (ref 0.50–1.35)
GFR calc Af Amer: >90 mL/min (ref >90)
GFR calc non Af Amer: 85 mL/min — ABNORMAL LOW (ref >90)
Glucose, Bld: 141 mg/dL — ABNORMAL HIGH (ref 70–99)
Potassium: 4.2 mEq/L (ref 3.7–5.3)
Sodium: 142 mEq/L (ref 137–147)
Total Bilirubin: 0.5 mg/dL (ref 0.3–1.2)
Total Protein: 6.8 g/dL (ref 6.0–8.3)

## 2013-08-07 LAB — URINALYSIS, ROUTINE W REFLEX MICROSCOPIC
Bilirubin Urine: NEGATIVE
Glucose, UA: >1000 mg/dL — AB
Hgb urine dipstick: NEGATIVE
Ketones, ur: 15 mg/dL — AB
Leukocytes, UA: NEGATIVE
Nitrite: NEGATIVE
Protein, ur: NEGATIVE mg/dL
Specific Gravity, Urine: 1.035 — ABNORMAL HIGH (ref 1.005–1.030)
Urobilinogen, UA: 0.2 mg/dL (ref 0.0–1.0)
pH: 5.5 (ref 5.0–8.0)

## 2013-08-07 LAB — CBC WITH DIFFERENTIAL/PLATELET
Basophils Absolute: 0 10*3/uL (ref 0.0–0.1)
Basophils Relative: 0 % (ref 0–1)
Eosinophils Absolute: 0 10*3/uL (ref 0.0–0.7)
Eosinophils Relative: 0 % (ref 0–5)
HCT: 39.5 % (ref 39.0–52.0)
Hemoglobin: 14.3 g/dL (ref 13.0–17.0)
Lymphocytes Relative: 22 % (ref 12–46)
Lymphs Abs: 1.7 10*3/uL (ref 0.7–4.0)
MCH: 31 pg (ref 26.0–34.0)
MCHC: 36.2 g/dL — ABNORMAL HIGH (ref 30.0–36.0)
MCV: 85.7 fL (ref 78.0–100.0)
Monocytes Absolute: 0.7 10*3/uL (ref 0.1–1.0)
Monocytes Relative: 9 % (ref 3–12)
Neutro Abs: 5.3 10*3/uL (ref 1.7–7.7)
Neutrophils Relative %: 69 % (ref 43–77)
Platelets: 144 10*3/uL — ABNORMAL LOW (ref 150–400)
RBC: 4.61 MIL/uL (ref 4.22–5.81)
RDW: 13.4 % (ref 11.5–15.5)
WBC: 7.7 10*3/uL (ref 4.0–10.5)

## 2013-08-07 MED ORDER — NITROGLYCERIN 0.4 MG SL SUBL: 0.4000 mg | SUBLINGUAL_TABLET | SUBLINGUAL | Status: DC | PRN

## 2013-08-07 MED ORDER — STROKE: EARLY STAGES OF RECOVERY BOOK
Freq: Once | Status: AC
  Administered 2013-08-07: 09:00:00
  Filled 2013-08-07: qty 1

## 2013-08-07 MED ORDER — VITAMIN D (ERGOCALCIFEROL) 1.25 MG (50000 UNIT) PO CAPS: 50000.0000 [IU] | ORAL_CAPSULE | ORAL | Status: DC

## 2013-08-07 MED ORDER — CLOPIDOGREL BISULFATE 75 MG PO TABS: 75.0000 mg | ORAL_TABLET | Freq: Every day | ORAL | Status: DC

## 2013-08-07 MED ORDER — INSULIN GLARGINE 100 UNIT/ML ~~LOC~~ SOLN
60.0000 [IU] | Freq: Every day | SUBCUTANEOUS | Status: DC
  Filled 2013-08-07: qty 0.6

## 2013-08-07 MED ORDER — METOPROLOL TARTRATE 100 MG PO TABS
100.0000 mg | ORAL_TABLET | Freq: Two times a day (BID) | ORAL | Status: DC
  Administered 2013-08-07: 100 mg via ORAL
  Filled 2013-08-07 (×2): qty 1

## 2013-08-07 MED ORDER — HYDROCODONE-ACETAMINOPHEN 7.5-325 MG PO TABS: 1.0000 | ORAL_TABLET | Freq: Four times a day (QID) | ORAL | Status: DC | PRN

## 2013-08-07 MED ORDER — SIMVASTATIN 20 MG PO TABS
20.0000 mg | ORAL_TABLET | Freq: Every day | ORAL | Status: DC
  Administered 2013-08-07: 20 mg via ORAL
  Filled 2013-08-07: qty 1

## 2013-08-07 MED ORDER — ALPRAZOLAM 0.5 MG PO TABS: 0.5000 mg | ORAL_TABLET | Freq: Two times a day (BID) | ORAL | Status: DC | PRN

## 2013-08-07 MED ORDER — ASPIRIN EC 81 MG PO TBEC
81.0000 mg | DELAYED_RELEASE_TABLET | Freq: Every day | ORAL | Status: DC
  Administered 2013-08-07: 81 mg via ORAL
  Filled 2013-08-07: qty 1

## 2013-08-07 MED ORDER — INSULIN ASPART 100 UNIT/ML ~~LOC~~ SOLN
0.0000 [IU] | Freq: Three times a day (TID) | SUBCUTANEOUS | Status: DC
  Administered 2013-08-07: 3 [IU] via SUBCUTANEOUS
  Administered 2013-08-07: 2 [IU] via SUBCUTANEOUS

## 2013-08-07 MED ORDER — INSULIN ASPART 100 UNIT/ML ~~LOC~~ SOLN: 0.0000 [IU] | Freq: Every day | SUBCUTANEOUS | Status: DC

## 2013-08-07 MED ORDER — TRAZODONE HCL 50 MG PO TABS
50.0000 mg | ORAL_TABLET | Freq: Every evening | ORAL | Status: DC | PRN
  Filled 2013-08-07: qty 2

## 2013-08-07 MED ORDER — ENOXAPARIN SODIUM 40 MG/0.4ML ~~LOC~~ SOLN
40.0000 mg | Freq: Every day | SUBCUTANEOUS | Status: DC
  Administered 2013-08-07: 40 mg via SUBCUTANEOUS
  Filled 2013-08-07: qty 0.4

## 2013-08-07 MED ORDER — DULOXETINE HCL 30 MG PO CPEP
30.0000 mg | ORAL_CAPSULE | Freq: Every day | ORAL | Status: DC
  Administered 2013-08-07: 30 mg via ORAL
  Filled 2013-08-07: qty 1

## 2013-08-07 MED ORDER — GABAPENTIN 300 MG PO CAPS
300.0000 mg | ORAL_CAPSULE | Freq: Three times a day (TID) | ORAL | Status: DC
  Administered 2013-08-07 (×2): 300 mg via ORAL
  Filled 2013-08-07 (×3): qty 1

## 2013-08-07 MED ORDER — PANTOPRAZOLE SODIUM 40 MG PO TBEC
40.0000 mg | DELAYED_RELEASE_TABLET | Freq: Every day | ORAL | Status: DC
  Administered 2013-08-07: 40 mg via ORAL
  Filled 2013-08-07: qty 1

## 2013-08-07 MED ORDER — ASPIRIN-DIPYRIDAMOLE ER 25-200 MG PO CP12
1.0000 | ORAL_CAPSULE | Freq: Two times a day (BID) | ORAL | Status: DC
  Administered 2013-08-07: 1 via ORAL
  Filled 2013-08-07 (×2): qty 1

## 2013-08-07 MED ORDER — IOHEXOL 350 MG/ML SOLN
100.0000 mL | Freq: Once | INTRAVENOUS | Status: AC | PRN
  Administered 2013-08-07: 100 mL via INTRAVENOUS
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
## 2013-08-07 NOTE — Evaluation (Signed)
Occupational Therapy Evaluation Patient Details Name: Lance [name] MRN: [id number] DOB: 1945/10/01 Today's Date: 08/07/2013    History of Present Illness 68 y.o. male with Past medical history of  coronary artery disease status post PCI, V. fib arrest status post ICD implant, hypertension, CVA, former smoker, diabetes, BPH, COPD .   Clinical Impression   Patient independent / modified independent with ADL's/ IADL's prior to this episode of vertigo and vomitting.  Patient's symptoms have resolved.  No further OT warranted at this time.  Patient and wife in agreement.  Patient and wife given resource number for Neuro OP rehab with vestibular program if problem returns.  Reviewed process for obtaining orders for OP.      Follow Up Recommendations  No OT follow up    Equipment Recommendations       Recommendations for Other Services       Precautions / Restrictions Precautions Precautions: None Precaution Comments: Mild stagger step with head turns during unassisted walking in hallway Restrictions Weight Bearing Restrictions: No      Mobility Bed Mobility Overal bed mobility: Modified Independent             General bed mobility comments: Head of bed up  Transfers Overall transfer level: Independent                    Balance Overall balance assessment: Independent                                          ADL Overall ADL's : Modified independent                                       General ADL Comments: uses sock aide since surgery 2013     Vision Eye Alignment: Within Functional Limits (mild medial orientation left eye, "lazy eye" per patient) Alignment/Gaze Preference:  (as noted left eye turns inward - longstanding)   Tracking/Visual Pursuits: Decreased smoothness of horizontal tracking (minor horizontal jump mid range left eye, looking toward right)     Diplopia Assessment:  (longstanding diplopia with  fatigue)       Perception Perception Perception Tested?: No   Praxis Praxis Praxis tested?: Within functional limits    Pertinent Vitals/Pain Vss, Denies pain     Hand Dominance     Extremity/Trunk Assessment Upper Extremity Assessment Upper Extremity Assessment: Overall WFL for tasks assessed       Cervical / Trunk Assessment Cervical / Trunk Assessment: Normal Cervical / Trunk Exceptions: History of back surgeries   Communication     Cognition Arousal/Alertness: Awake/alert   Overall Cognitive Status: Within Functional Limits for tasks assessed                     General Comments       Exercises       Shoulder Instructions      Home Living Family/patient expects to be discharged to:: Private residence Living Arrangements: Spouse/significant other                                      Prior Functioning/Environment               OT  Diagnosis:     OT Problem List:     OT Treatment/Interventions:      OT Goals(Current goals can be found in the care plan section) Acute Rehab OT Goals Patient Stated Goal: go home  OT Frequency:     Barriers to D/C:            Co-evaluation              End of Session    Activity Tolerance: Patient tolerated treatment well Patient left: in bed;with family/visitor present;with call bell/phone within reach   Time: 4970-2637 OT Time Calculation (min): 29 min Charges:  OT General Charges $OT Visit: 1 Procedure OT Evaluation $Initial OT Evaluation Tier I: 1 Procedure OT Treatments $Self Care/Home Management : 23-37 mins G-Codes: OT G-codes **NOT FOR INPATIENT CLASS** Functional Assessment Tool Used: clinical judgement Functional Limitation: Self care Self Care Current Status (C5885): 0 percent impaired, limited or restricted Self Care Goal Status (O2774): 0 percent impaired, limited or restricted Self Care Discharge Status (J2878): 0 percent impaired, limited or restricted   Marlowe Sax M 08/07/2013, 11:22 AM

## 2013-08-07 NOTE — Progress Notes (Signed)
Echo Lab  2D Echocardiogram completed.  Pierre, RDCS 08/07/2013 1:30 PM

## 2013-08-07 NOTE — Discharge Summary (Signed)
Physician Discharge Summary  Patient ID: Lance [name] MRN: [id number] DOB/AGE: [id number] 66 y.o.  Admit date: 08/06/2013 Discharge date: 08/07/2013  Primary Care Physician:  Sallee Lange, MD  Discharge Diagnoses:    . Dizziness possibly posterior circulation TIA . DIABETES MELLITUS, TYPE II . HYPERLIPIDEMIA . HYPERTENSION . DEPRESSION . GERD . ICD (implantable cardioverter-defibrillator) in place  Consults:  Neurology- Dr Leonie Man                    EP- for pacer check    Allergies:   Allergies  Allergen Reactions  . Penicillins Hives  . Morphine And Related Other (See Comments)    hallucinations  . Other     Adhesive tape  . Percocet [Oxycodone-Acetaminophen] Other (See Comments)    hallucinations  . Levaquin [Levofloxacin In D5w]     itching     Discharge Medications:   Medication List    STOP taking these medications       dipyridamole-aspirin 200-25 MG per 12 hr capsule  Commonly known as:  AGGRENOX      TAKE these medications       ALPRAZolam 1 MG tablet  Commonly known as:  XANAX  Take 0.5-1 mg by mouth 2 (two) times daily as needed for anxiety.     aspirin 81 MG tablet  Take 81 mg by mouth daily.     BYETTA 5 MCG PEN [place]  Inject 5 Units into the skin 2 (two) times daily.     clopidogrel 75 MG tablet  Commonly known as:  PLAVIX  Take 1 tablet (75 mg total) by mouth daily with breakfast.     DEXILANT 60 MG capsule  Generic drug:  dexlansoprazole  Take 60 mg by mouth 2 (two) times daily.     DULoxetine 30 MG capsule  Commonly known as:  CYMBALTA  Take 30 mg by mouth daily.     gabapentin 300 MG capsule  Commonly known as:  NEURONTIN  One to two tabs tid     HYDROcodone-acetaminophen 7.5-325 MG per tablet  Commonly known as:  NORCO     LANTUS SOLOSTAR 100 UNIT/ML injection  Generic drug:  insulin glargine  Inject 60 Units into the skin at bedtime.     losartan-hydrochlorothiazide 50-12.5 MG per tablet  Commonly known as:  HYZAAR   Take 1 tablet by mouth daily.     metFORMIN 1000 MG tablet  Commonly known as:  GLUCOPHAGE  Take 1,000 mg by mouth 2 (two) times daily.     metoprolol 100 MG tablet  Commonly known as:  LOPRESSOR  Take 100 mg by mouth 2 (two) times daily.     nitroGLYCERIN 0.4 MG SL tablet  Commonly known as:  NITROSTAT  Place 1 tablet (0.4 mg total) under the tongue every 5 (five) minutes as needed. For chest pain     NOVOLOG FLEXPEN 100 UNIT/ML injection  Generic drug:  insulin aspart  - Inject 15-18 Units into the skin 3 (three) times daily before meals. Uses according to sliding scale at home. 15 -18 UNITS 3 TIMES DAILY  - 15 in am  - 15 lunch  - 15 pm     simvastatin 20 MG tablet  Commonly known as:  ZOCOR  Take 20 mg by mouth daily at 6 PM.     traZODone 100 MG tablet  Commonly known as:  DESYREL  Take 50-100 mg by mouth at bedtime as needed for sleep.     vardenafil 10  MG tablet  Commonly known as:  LEVITRA  Take 1 tablet (10 mg total) by mouth as needed for erectile dysfunction.     vitamin B-12 1000 MCG tablet  Commonly known as:  CYANOCOBALAMIN  Take 1,000 mcg by mouth daily.     Vitamin D (Ergocalciferol) 50000 UNITS Caps capsule  Commonly known as:  DRISDOL  Take 50,000 Units by mouth every 30 (thirty) days. Mondays         Brief H and P: For complete details please refer to admission H and P, but in brief Lance [name] is a 68 y.o. male with Past medical history of coronary artery disease status post PCI, V. fib arrest status post ICD implant, hypertension, CVA, former smoker, diabetes, BPH, COPD .  the patient presented with complain of dizziness associated with recurrent episode of vomiting that started tonight. He mentions he was at his baseline when he went to sleep and he was sleeping on his left side and then he turned on his position to sleep on his right side he started having spinning sensation, this was followed by vomiting multiple times which was  nonbilious nonbloody. He checked his sugar taking that it was low but was normal. He denies any syncope, chest pain, palpitation, shortness of breath, abdominal pain when he was having this symptoms. He wasn't able to walk without a sense of falling down and therefore they called EMS.  Patient denies any focal neurological deficit at the time of my evaluation. Patient denies any similar episode in the past.  Since last 3 days he has been having congestion in his maxillary sinuses with sore throat. He denies any ringing from his ear, fullness of his ear, discharge from his ear.  He denies any recent change in his medications.   Hospital Course:   Dizziness: His symptoms are consistent with vertigo, recent upper respiratory infection or possibly posterior circulation TIA as it has been recurrent, 3rd episode.   - Due to his prior history of TIA, patient underwent CT imaging which was negative  - CT angiogram of the head and neck showed no acute vascular injury   pacemaker interrogation was done and showed no arrhthymias.  On examination, Dix-Hallpike was negative. PT OT evaluation was done and evaluation shows no acute vertigo  2-D echo showed EF 56-21%, diastolic dysfunction.  Patient was on aspirin and Aggrenox prior to admission. Discussed with neurology/stroke service, Dr Leonie Man who recommended switching to ASA and plavix.  Patient was recommended to follow up with Dr Jannifer Franklin.     Day of Discharge BP 134/78  Pulse 69  Temp(Src) 98.5 F (36.9 C) (Oral)  Resp 18  Ht 5\' 9"  (1.753 m)  Wt 91.445 kg (201 lb 9.6 oz)  BMI 29.76 kg/m2  SpO2 95%  Physical Exam: General: Alert and awake oriented x3 not in any acute distress. HEENT: anicteric sclera, pupils reactive to light and accommodation CVS: S1-S2 clear no murmur rubs or gallops Chest: clear to auscultation bilaterally, no wheezing rales or rhonchi Abdomen: soft nontender, nondistended, normal bowel sounds Extremities: no cyanosis,  clubbing or edema noted bilaterally Neuro: Cranial nerves II-XII intact, no focal neurological deficits   The results of significant diagnostics from this hospitalization (including imaging, microbiology, ancillary and laboratory) are listed below for reference.    LAB RESULTS: Basic Metabolic Panel:  Recent Labs Lab 08/06/13 2234 08/07/13 0840  NA 143 142  K 3.8 4.2  CL 100 99  CO2 25 26  GLUCOSE 135* 141*  BUN 18 16  CREATININE 1.03 0.95  CALCIUM 10.2 9.6   Liver Function Tests:  Recent Labs Lab 08/06/13 2234 08/07/13 0840  AST 21 18  ALT 19 16  ALKPHOS 65 60  BILITOT 0.2* 0.5  PROT 7.8 6.8  ALBUMIN 4.3 3.7   No results found for this basename: LIPASE, AMYLASE,  in the last 168 hours No results found for this basename: AMMONIA,  in the last 168 hours CBC:  Recent Labs Lab 08/06/13 2234 08/07/13 0840  WBC 8.0 7.7  NEUTROABS 3.7 5.3  HGB 15.6 14.3  HCT 43.0 39.5  MCV 84.3 85.7  PLT 157 144*   Cardiac Enzymes:  Recent Labs Lab 08/06/13 2234  TROPONINI <0.30   BNP: No components found with this basename: POCBNP,  CBG:  Recent Labs Lab 08/07/13 1135 08/07/13 1655  GLUCAP 166* 167*    Significant Diagnostic Studies:  Ct Angio Head W/cm &/or Wo Cm  08/07/2013   CLINICAL DATA:  Transient ischemic attack, history of stroke. New onset vertigo and disequilibrium.  EXAM: CT ANGIOGRAPHY HEAD AND NECK  TECHNIQUE: Multidetector CT imaging of the head and neck was performed using the standard protocol during bolus administration of intravenous contrast. Multiplanar CT image reconstructions and MIPs were obtained to evaluate the vascular anatomy. Carotid stenosis measurements (when applicable) are obtained utilizing NASCET criteria, using the distal internal carotid diameter as the denominator.  CONTRAST:  162mL OMNIPAQUE IOHEXOL 350 MG/ML SOLN  COMPARISON:  CT HEAD W/O CM dated 08/07/2013; CT HEAD W/O CM dated 04/27/2012  FINDINGS: CTA HEAD FINDINGS  Anterior  circulation: Normal appearance of the cervical internal carotid arteries, petrous, cavernous and supra clinoid internal carotid arteries. Moderate calcific atherosclerosis of the carotid siphons. Absent right A1 segment, the right anterior cerebral artery arises from left A1-2 segment. Widely patent anterior communicating artery. Normal appearance of the anterior and middle cerebral arteries.  Posterior circulation: Left vertebral artery is dominant, with normal appearance of the vertebral arteries, vertebrobasilar junction and basilar artery, as well as main branch vessels. Mild eccentric calcific atherosclerosis of the intradural left vertebral artery which remains widely patent. Robust right posterior communicating artery provides the dominant the vascular supply to the right posterior cerebral artery. Normal appearance of posterior cerebral arteries.  No large vessel occlusion, hemodynamically significant stenosis, dissection, luminal irregularity, contrast extravasation or aneurysm within the anterior nor posterior circulation.  Review of the MIP images confirms the above findings.  Postcontrast CT of the head shows no abnormal intraparenchymal nor leptomeningeal enhancement. Right mastoid effusion again noted.  CTA NECK FINDINGS  Normal appearance of the thoracic arch, normal branch pattern. Minimal calcific atherosclerosis at the aortic arch. The origins of the innominate, left Common carotid artery and subclavian artery are widely patent.  Bilateral Common carotid arteries are widely patent, coursing in a straight line fashion. Normal appearance of the carotid bifurcations without hemodynamically significant stenosis by NASCET criteria. Normal appearance of the included internal carotid arteries. Bilateral tonsillar loops, normal variant.  Left vertebral artery is dominant. Normal appearance of the vertebral arteries, which appear widely patent.  No hemodynamically significant stenosis by NASCET criteria ;  approximately 40% narrowing of the origin of the right internal carotid artery by CT calcific atherosclerosis and intimal thickening. No dissection, no pseudoaneurysm. No abnormal luminal irregularity. No contrast extravasation.  Soft tissues are nonsuspicious, status post median sternotomy with surgical clips at the thoracic inlet. No acute osseous process though bone windows have not been submitted. Straightened cervical lordosis with C4-5  and C5-6 interbody arthrodesis, C6-7 anterior plate and screw fixation.  Review of the MIP images confirms the above findings.  IMPRESSION: CTA head: No acute vascular injury nor hemodynamically significant stenosis. Moderate calcific atherosclerosis of the carotid siphons. Normal variations of the circle Willis as described above.  CTA neck: Approximately 40% narrowing of the origin of the right internal carotid artery by NASCET criteria. No acute vascular process in the neck.   Electronically Signed   By: Elon Alas   On: 08/07/2013 04:03   Ct Head Wo Contrast  08/07/2013   CLINICAL DATA:  DIZZINESS EMESIS  EXAM: CT HEAD WITHOUT CONTRAST  TECHNIQUE: Contiguous axial images were obtained from the base of the skull through the vertex without intravenous contrast.  COMPARISON:  US CAROTID DUPLEX BILAT dated 04/30/2013; CT HEAD W/O CM dated 04/27/2012  FINDINGS: The ventricles and sulci are normal for age. No intraparenchymal hemorrhage, mass effect nor midline shift. Patchy supratentorial white matter hypodensities are within normal range for patient's age and though non-specific suggest sequelae of chronic small vessel ischemic disease. No acute large vascular territory infarcts.  No abnormal extra-axial fluid collections. Basal cisterns are patent. Moderate calcific atherosclerosis of the carotid siphons and included vertebral arteries.  No skull fracture. Chronic bony wall thickening of the maxillary sinuses. Trace paranasal sinus mucosal thickening without air-fluid  levels. Soft tissue density within the right mastoid air cells. The included ocular globes and orbital contents are non-suspicious.  IMPRESSION: No acute intracranial process. Stable appearance of the head from April 27, 2012.  Similar right mastoid effusion.   Electronically Signed   By: Elon Alas   On: 08/07/2013 00:47   Ct Angio Neck W/cm &/or Wo/cm  08/07/2013   CLINICAL DATA:  Transient ischemic attack, history of stroke. New onset vertigo and disequilibrium.  EXAM: CT ANGIOGRAPHY HEAD AND NECK  TECHNIQUE: Multidetector CT imaging of the head and neck was performed using the standard protocol during bolus administration of intravenous contrast. Multiplanar CT image reconstructions and MIPs were obtained to evaluate the vascular anatomy. Carotid stenosis measurements (when applicable) are obtained utilizing NASCET criteria, using the distal internal carotid diameter as the denominator.  CONTRAST:  155mL OMNIPAQUE IOHEXOL 350 MG/ML SOLN  COMPARISON:  CT HEAD W/O CM dated 08/07/2013; CT HEAD W/O CM dated 04/27/2012  FINDINGS: CTA HEAD FINDINGS  Anterior circulation: Normal appearance of the cervical internal carotid arteries, petrous, cavernous and supra clinoid internal carotid arteries. Moderate calcific atherosclerosis of the carotid siphons. Absent right A1 segment, the right anterior cerebral artery arises from left A1-2 segment. Widely patent anterior communicating artery. Normal appearance of the anterior and middle cerebral arteries.  Posterior circulation: Left vertebral artery is dominant, with normal appearance of the vertebral arteries, vertebrobasilar junction and basilar artery, as well as main branch vessels. Mild eccentric calcific atherosclerosis of the intradural left vertebral artery which remains widely patent. Robust right posterior communicating artery provides the dominant the vascular supply to the right posterior cerebral artery. Normal appearance of posterior cerebral arteries.   No large vessel occlusion, hemodynamically significant stenosis, dissection, luminal irregularity, contrast extravasation or aneurysm within the anterior nor posterior circulation.  Review of the MIP images confirms the above findings.  Postcontrast CT of the head shows no abnormal intraparenchymal nor leptomeningeal enhancement. Right mastoid effusion again noted.  CTA NECK FINDINGS  Normal appearance of the thoracic arch, normal branch pattern. Minimal calcific atherosclerosis at the aortic arch. The origins of the innominate, left Common carotid artery and subclavian  artery are widely patent.  Bilateral Common carotid arteries are widely patent, coursing in a straight line fashion. Normal appearance of the carotid bifurcations without hemodynamically significant stenosis by NASCET criteria. Normal appearance of the included internal carotid arteries. Bilateral tonsillar loops, normal variant.  Left vertebral artery is dominant. Normal appearance of the vertebral arteries, which appear widely patent.  No hemodynamically significant stenosis by NASCET criteria ; approximately 40% narrowing of the origin of the right internal carotid artery by CT calcific atherosclerosis and intimal thickening. No dissection, no pseudoaneurysm. No abnormal luminal irregularity. No contrast extravasation.  Soft tissues are nonsuspicious, status post median sternotomy with surgical clips at the thoracic inlet. No acute osseous process though bone windows have not been submitted. Straightened cervical lordosis with C4-5 and C5-6 interbody arthrodesis, C6-7 anterior plate and screw fixation.  Review of the MIP images confirms the above findings.  IMPRESSION: CTA head: No acute vascular injury nor hemodynamically significant stenosis. Moderate calcific atherosclerosis of the carotid siphons. Normal variations of the circle Willis as described above.  CTA neck: Approximately 40% narrowing of the origin of the right internal carotid  artery by NASCET criteria. No acute vascular process in the neck.   Electronically Signed   By: Elon Alas   On: 08/07/2013 04:03    2D ECHO: Study Conclusions  - Left ventricle: The cavity size was normal. There wasmoderate focal basal and mild concentric hypertrophy of the septum. Systolic function was mildly reduced. The estimated ejection fraction was in the range of 45% to 50%. Wall motion was normal; there were no regional wall motion abnormalities. Doppler parameters are consistent with abnormal left ventricular relaxation (grade 1 diastolic dysfunction). There was no evidence of elevated ventricular filling pressure by Doppler parameters. - Aortic valve: Trileaflet; mildly thickened, mildly calcified leaflets. No regurgitation. - Mitral valve: Calcified annulus. Mildly thickened leaflets . Trivial regurgitation. - Left atrium: The atrium was moderately dilated. - Right ventricle: Systolic function was moderately reduced. - Right atrium: The atrium was mildly dilated. - Tricuspid valve: Mild regurgitation. - Pulmonary arteries: Systolic pressure was within the normal range. - Inferior vena cava: The vessel was normal in size. - Pericardium, extracardiac: There was no pericardial effusion.    Disposition and Follow-up:     Discharge Orders   Future Appointments Provider Department Dept Phone   08/10/2013 3:40 PM Arnoldo Lenis, MD [facility] [place] [phone]   09/03/2013 9:20 AM [facility] [place] [facility] [phone]   12/18/2013 9:00 AM Kathrynn Ducking, MD [facility] [phone]   Future Orders Complete By Expires   Diet Carb Modified  As directed    Discharge instructions  As directed    Comments:     Please call Dr Jannifer Franklin [facility]) to move your appointment in 2 weeks.   Increase activity slowly  As directed        DISPOSITION: home   DISCHARGE FOLLOW-UP Follow-up Information    Follow up with LUKING,SCOTT, MD. Schedule an appointment as soon as possible for a visit in 2 weeks. (for hospital follow-up)    Specialty:  Family Medicine   Contact information:   [place] [address] [phone]       Follow up with Lenor Coffin, MD. Schedule an appointment as soon as possible for a visit in 1 month. (for hospital follow-up for TIA)    Specialty:  Neurology   Contact information:   [address]  [place] [address] [phone]       Time spent on Discharge: 35 mins  Signed:   Griselda Bramblett M.D. [facility] 08/07/2013, 5:52 PM Pager: [phone]

## 2013-08-07 NOTE — Progress Notes (Signed)
Stroke Team Progress Note  HISTORY Lance [name] is a 68 y.o. male with a history of previous strokes who presents with transient vertigo and dysequilibrium that started acutely while lying down earlier the evening of 08/06/2013. He states that his symptoms would be improved whenever he moved, but even when lying still he would continue to have a sensation of movement. His wife states that even after he arrived in the emergency department, whenever he would close his eyes he complain of sensation of movement. Since that time, however, his symptoms have completely resolved. He estimates that they lasted for approximately 4 hours.   Patient was last known well 08/06/2013 at 2030. Patient was not considered for TPA secondary to resolving symptoms. He was admitted for further evaluation and treatment.  SUBJECTIVE His wife is at the bedside.  Overall he feels his condition is completely resolved.   OBJECTIVE Most recent Vital Signs: Filed Vitals:   08/07/13 0315 08/07/13 0354 08/07/13 0500 08/07/13 0737  BP: 137/86 154/86 158/80 139/85  Pulse: 72 63 69 77  Temp:  97.9 F (36.6 C)  98.5 F (36.9 C)  TempSrc:    Oral  Resp: 13 18  18   Height:  5\' 9"  (1.753 m)    Weight:  91.445 kg (201 lb 9.6 oz)    SpO2: 94% 98%  96%   CBG (last 3)   Recent Labs  08/07/13 0401 08/07/13 0741  GLUCAP 154* 137*    IV Fluid Intake:     MEDICATIONS  .  stroke: mapping our early stages of recovery book   Does not apply Once  . aspirin EC  81 mg Oral Daily  . dipyridamole-aspirin  1 capsule Oral BID  . DULoxetine  30 mg Oral Daily  . enoxaparin (LOVENOX) injection  40 mg Subcutaneous Daily  . gabapentin  300 mg Oral TID  . insulin aspart  0-15 Units Subcutaneous TID WC  . insulin aspart  0-5 Units Subcutaneous QHS  . insulin glargine  60 Units Subcutaneous QHS  . metoprolol  100 mg Oral BID  . pantoprazole  40 mg Oral Daily  . simvastatin  20 mg Oral q1800  . [START ON 08/31/2013] Vitamin D  (Ergocalciferol)  50,000 Units Oral Q30 days   PRN:  ALPRAZolam, HYDROcodone-acetaminophen, nitroGLYCERIN, traZODone  Diet:  Carb Control thin liquids Activity:   Bathroom privileges with assistance DVT Prophylaxis:  Lovenox 40 mg sq daily   CLINICALLY SIGNIFICANT STUDIES Basic Metabolic Panel:   Recent Labs Lab 08/06/13 2234  NA 143  K 3.8  CL 100  CO2 25  GLUCOSE 135*  BUN 18  CREATININE 1.03  CALCIUM 10.2   Liver Function Tests:   Recent Labs Lab 08/06/13 2234  AST 21  ALT 19  ALKPHOS 65  BILITOT 0.2*  PROT 7.8  ALBUMIN 4.3   CBC:   Recent Labs Lab 08/06/13 2234  WBC 8.0  NEUTROABS 3.7  HGB 15.6  HCT 43.0  MCV 84.3  PLT 157   Coagulation: No results found for this basename: LABPROT, INR,  in the last 168 hours Cardiac Enzymes:   Recent Labs Lab 08/06/13 2234  TROPONINI <0.30   Urinalysis:   Recent Labs Lab 08/07/13 0238  COLORURINE YELLOW  LABSPEC 1.035*  PHURINE 5.5  GLUCOSEU >1000*  HGBUR NEGATIVE  BILIRUBINUR NEGATIVE  KETONESUR 15*  PROTEINUR NEGATIVE  UROBILINOGEN 0.2  NITRITE NEGATIVE  LEUKOCYTESUR NEGATIVE   Lipid Panel    Component Value Date/Time   CHOL 154  07/25/2013 0730   TRIG 155* 07/25/2013 0730   HDL 31* 07/25/2013 0730   CHOLHDL 5.0 07/25/2013 0730   VLDL 31 07/25/2013 0730   LDLCALC 92 07/25/2013 0730   HgbA1C  Lab Results  Component Value Date   HGBA1C 7.8* 01/07/2012    Urine Drug Screen:     Component Value Date/Time   LABOPIA NONE DETECTED 12/01/2011 1837   COCAINSCRNUR NONE DETECTED 12/01/2011 1837   LABBENZ NONE DETECTED 12/01/2011 1837   AMPHETMU NONE DETECTED 12/01/2011 1837   THCU NONE DETECTED 12/01/2011 1837   LABBARB NONE DETECTED 12/01/2011 1837    Alcohol Level: No results found for this basename: ETH,  in the last 168 hours   CT of the brain  08/07/2013   No acute intracranial process. Stable appearance of the head from April 27, 2012.  Similar right mastoid effusion.   CT angio of the head   08/07/2013     No acute vascular injury nor hemodynamically significant stenosis. Moderate calcific atherosclerosis of the carotid siphons. Normal variations of the circle Willis as described above.    CT angio of the neck 08/07/2013    Approximately 40% narrowing of the origin of the right internal carotid artery by NASCET criteria. No acute vascular process in the neck.     MRI/A of the brain  pacer  2D Echocardiogram    Carotid Doppler  See CT angio neck  CXR    EKG  normal sinus rhythm. For complete results please see formal report.   Therapy Recommendations   Physical Exam   Pleasant elderly Caucasian male not in distress.Awake alert. Afebrile. Head is nontraumatic. Neck is supple without bruit. Hearing is normal. Cardiac exam no murmur or gallop. Lungs are clear to auscultation. Distal pulses are well felt. Neurological Exam ;  Awake  Alert oriented x 3. Normal speech and language.eye movements full without nystagmus.fundi were not visualized. Vision acuity and fields appear normal. Hearing is normal. Palatal movements are normal. Face symmetric. Tongue midline. Normal strength, tone, reflexes and coordination. Normal sensation. Gait deferred. ASSESSMENT [name] is a 68 y.o. male presenting with acute onset dizziness. This is his 3rd posterior circulation event, TIA. Followed by Dr. Erling Cruz in the past. Suspect brainstem TIA again. CT negative for acute abnormality (cannot have a MRI due to pacer).   On aspirin 81 mg orally every day and dipyridamole SR 250 mg/aspirin 25 mg orally twice a day prior to admission. Now on aspirin 81 mg orally every day and dipyridamole SR 250 mg/aspirin 25 mg orally twice a day for secondary stroke prevention. Patient with no resultant neuro symptoms. Stroke work up underway.  hypertension Hyperlipidemia, LDL 92 < 1 mo ago, on zocor 20 mg daily PTA, now on zocor 20 mg daily, goal LDL < 70 for diabetics Diabetes, HgbA1c not drawn, goal < 7.0 Tobacco  abuse TIA 05/2008 CAD  Pacemaker  Hospital day # 1  TREATMENT/PLAN  Continue aspirin 81 mg orally every day and dipyridamole SR 250 mg/aspirin 25 mg orally twice a day for secondary stroke prevention for now. Patient interested in considering [facility] research study for secondary stroke prevention. DR. Tilden Dome spoke with patient and family. [facility] will follow up.  Pacemaker to be interrogated to look for arrhythmia  Follow up 2D echo  Therapy evals   Burnetta Sabin, MSN, RN, ANVP-BC, AGPCNP-BC [facility] Pager: [phone] 08/07/2013 9:35 AM  I have personally obtained a history, examined the patient, evaluated  imaging results, and formulated the assessment and plan of care. I agree with the above. Antony Contras, MD   To contact Stroke Continuity provider, please refer to [email or web address] After hours, contact General Neurology

## 2013-08-07 NOTE — Progress Notes (Addendum)
Patient seen and examined, admitted by Dr. Posey Pronto this morning  Briefly patient is a 68 year old male with history ofcoronary artery disease status post PCI, V. fib arrest status post ICD implant, hypertension, CVA, former smoker, diabetes, BPH, COPD . Presented with dizziness with recurrent episodes of vomiting that started last night.  Patient reports his symptoms have improved.  BP 143/92  Pulse 76  Temp(Src) 98.5 F (36.9 C) (Oral)  Resp 18  Ht 5\' 9"  (1.753 m)  Wt 91.445 kg (201 lb 9.6 oz)  BMI 29.76 kg/m2  SpO2 96%  Dizziness: His symptoms are consistent with vertigo, recent upper respiratory infection or posterior circulation TIA - Due to his prior history of TIA, patient underwent CT imaging which was negative  - CT angiogram of the head and neck showed no acute vascular injury  - Called EP for pacemaker interrogation - On examination, Dix-Hallpike was negative. PT OT evaluation was done and evaluation shows no acute vertigo - 2-D echo pending  - Continue aspirin and Aggrenox, neurology/stroke service following   RAI,RIPUDEEP M.D. [facility] 08/07/2013, 11:40 AM  Pager: [phone]

## 2013-08-07 NOTE — Evaluation (Signed)
Physical Therapy Evaluation Patient Details Name: Lance [name] MRN: [id number] DOB: [birth year] Today's Date: 08/07/2013   History of Present Illness  68 y.o. male with Past medical history of  coronary artery disease status post PCI, V. fib arrest status post ICD implant, hypertension, CVA, former smoker, diabetes, BPH, COPD .  Clinical Impression  See general comments below for vestibular assessment.  Assessment is consistent with labyrinthitis.  Pt is asymptomatic at this time and requires no acute or f/u PT.  PT to sign off.     Follow Up Recommendations No PT follow up    Equipment Recommendations  None recommended by PT    Recommendations for Other Services   None    Precautions / Restrictions Precautions Precautions: None Precaution Comments: Mild stagger step with head turns during unassisted walking in hallway Restrictions Weight Bearing Restrictions: No      Mobility  Bed Mobility Overal bed mobility: Modified Independent             General bed mobility comments: Head of bed up  Transfers Overall transfer level: Independent Equipment used: None                Ambulation/Gait Ambulation/Gait assistance: Supervision Ambulation Distance (Feet): 250 Feet Assistive device: None Gait Pattern/deviations: Staggering left;Staggering right     General Gait Details: very mild staggering gait pattern with horizontal head turns.  Pt reports this is his baseline and can catch himself without external assist from PT.   Stairs Stairs: Yes Stairs assistance: Modified independent (Device/Increase time) Stair Management: One rail Right;Forwards;Alternating pattern Number of Stairs: 9 General stair comments: easily ascend and descend reciprocally with rail.          Balance Overall balance assessment: Independent                               Standardized Balance Assessment Standardized Balance Assessment : Dynamic Gait Index   Dynamic  Gait Index Level Surface: Normal Change in Gait Speed: Normal Gait with Horizontal Head Turns: Mild Impairment Gait with Vertical Head Turns: Normal Gait and Pivot Turn: Normal Step Over Obstacle: Normal Step Around Obstacles: Normal Steps: Mild Impairment Total Score: 22       Pertinent Vitals/Pain See vitals flow sheet.     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  He and his wife are both retired.  He does not use an assistive device to walk and has no reports of recent falls or stumbles.          Extremity/Trunk Assessment   Upper Extremity Assessment: Overall WFL for tasks assessed           Lower Extremity Assessment: LLE deficits/detail   LLE Deficits / Details: h/o L TKA  Cervical / Trunk Assessment: Normal  Communication   Communication: HOH (per pt self report, "I should wear hearing aids")  Cognition Arousal/Alertness: Awake/alert Behavior During Therapy: WFL for tasks assessed/performed Overall Cognitive Status: Within Functional Limits for tasks assessed  General Comments General comments (skin integrity, edema, etc.): Vestibular assessment completed.  No reports of ringing or fullness in his ears.  No sudden onset hearing changes or visual changes.  He does report h/o L eye strabismus and h/o a few mild boughts of vertigo in the past, but never this severe.  He has had no recent head trauma, but does report sinus issues currently due to allergies related to pollen.  Roll test (-) bil for symptoms, modified  dix hallpike (-) bil for symptoms, gaze stability testing both vertical and horizontal head shaking normal and did not elicit symptoms.  Oculomotor testing. Pt did display some difficulty with occulomotor testing revealed a bit of saccades when going left to right horizontally, but no obvious nystagmus or symptoms.            Assessment/Plan    PT Assessment Patent does not need any further PT services  PT Diagnosis     PT Problem List    PT Treatment Interventions     PT Goals (Current goals can be found in the Care Plan section) Acute Rehab PT Goals Patient Stated Goal: go home PT Goal Formulation: No goals set, d/c therapy    Frequency   NA- one time eval and d/c   Barriers to discharge   None          End of Session   Activity Tolerance: Patient tolerated treatment well Patient left: in bed;with call bell/phone within reach;with family/visitor present      Functional Assessment Tool Used: DGI Functional Limitation: Mobility: Walking and moving around Mobility: Walking and Moving Around Current Status ([id number]): At least 1 percent but less than 20 percent impaired, limited or restricted Mobility: Walking and Moving Around Goal Status [phone]): At least 1 percent but less than 20 percent impaired, limited or restricted Mobility: Walking and Moving Around Discharge Status [phone]): At least 1 percent but less than 20 percent impaired, limited or restricted    Time: 1021-1046 PT Time Calculation (min): 25 min   Charges:   PT Evaluation $Initial PT Evaluation Tier I: 1 Procedure PT Treatments $Gait Training: 8-22 mins   PT G Codes:   Functional Assessment Tool Used: DGI Functional Limitation: Mobility: Walking and moving around    [place]. [place], [place], DPT [phone]   08/07/2013, 11:32 AM

## 2013-08-07 NOTE — ED Notes (Signed)
CT called to inquire if pt can be scanned prior to going upstairs.

## 2013-08-07 NOTE — Consult Note (Signed)
Neurology Consultation Reason for Consult: Dizziness Referring Physician: Kathrynn Humble, A  CC: Dizziness  History is obtained from: Patient   HPI: Lance [name] is a 68 y.o. male with a history of previous strokes who presents with transient vertigo and dysequilibrium that started acutely while lying down earlier this evening. He states that his symptoms would be improved whenever he moved, but even when lying still he would continue to have a sensation of movement. His wife states that even after he arrived in the emergency department, whenever he would close his eyes he complain of sensation of movement.  Since that time, however, his symptoms have completely resolved. He estimates that they lasted for approximately 4 hours.      ROS: A 14 point ROS was performed and is negative except as noted in the HPI.  Past Medical History  Diagnosis Date  . ASCVD (arteriosclerotic cardiovascular disease)     70% mid left anterior descending lesion on cath in 06/1995; left anterior desending DES placed in 8/03 and RCA stent in 9/03; captain 3/05 revealed 90% second marginal for which PCI was performed, 70% PDA and a total obstruction of the first diagonal and marginal; sudden cardiac death in [place] in October 12, 2003 for which automatic implantable cardiac defibrillator placed; negativ stress nuclear 10/07  . Hypertension   . Hyperlipidemia   . Tobacco abuse     100 pack/year comsuption; cigarettes discontinued 2003; all tobacco products in 2008  . CVD (cerebrovascular disease) 05/2008    Transient ischemic attack; carotid ultrasound-plaque without focal disease  . Diabetes mellitus     Insulin requirement  . Degenerative joint disease 2002    C-spine fusion   . Erectile dysfunction   . Anxiety and depression   . Benign prostatic hypertrophy   . Other testicular hypofunction   . Esophageal reflux   . Diabetes mellitus without mention of complication   . Coronary artery disease   . Allergic  rhinitis, cause unspecified   . S/P endoscopy Dec 2011    RMR: nl esophagus, hyperplastic polyp, active gastritis, no H.pylori.   . ICD (implantable cardiac defibrillator) in place   . Pacemaker   . Hx-TIA (transient ischemic attack) 2010  . Tubular adenoma   . CAD (coronary artery disease) 1997  . COPD (chronic obstructive pulmonary disease)     Family History: Father-hypertension  Social History: Tob: denies  Exam: Current vital signs: BP 159/78  Pulse 74  Temp(Src) 98 F (36.7 C) (Oral)  Resp 11  Ht 5\' 9"  (1.753 m)  Wt 101.606 kg (224 lb)  BMI 33.06 kg/m2  SpO2 98% Vital signs in last 24 hours: Temp:  [98 F (36.7 C)] 98 F (36.7 C) (04/06 2230) Pulse Rate:  [70-83] 74 (04/07 0045) Resp:  [10-17] 11 (04/07 0045) BP: (147-180)/(78-144) 159/78 mmHg (04/07 0015) SpO2:  [94 %-99 %] 98 % (04/07 0045) Weight:  [101.606 kg (224 lb)] 101.606 kg (224 lb) (04/06 2230)  General: in bed, NAD CV: regular rate and rhythm Mental Status: Patient is awake, alert, oriented to person, place, month, year, and situation. Immediate and remote memory are intact. Patient is able to give a clear and coherent history. No signs of aphasia or neglect Cranial Nerves: II: Visual Fields are full. Pupils are equal, round, and reactive to light.  Discs are difficult to visualize. III,IV, VI: EOMI without ptosis or diploplia. He has horizontal nystagmus on right gaze only. V: Facial sensation is symmetric to temperature VII: Facial movement is symmetric.  VIII: hearing is intact to voice X: Uvula elevates symmetrically XI: Shoulder shrug is symmetric. XII: tongue is midline without atrophy or fasciculations.  Motor: Tone is normal. Bulk is normal. 5/5 strength was present in all four extremities.  Sensory: Sensation is symmetric to light touch and temperature in the arms and legs. Deep Tendon Reflexes: 2+ and symmetric in the biceps and patellae.  Plantars: Toes are downgoing  bilaterally.  Cerebellar: FNF and HKS are intact bilaterally Gait: Not tested because of multiple medical monitors in ED setting.  Dix-Hallpike failed to elicit symptoms Head thrust test is negative.    I have reviewed labs in epic and the results pertinent to this consultation are: Cbc, cmp unremarkable.   I have reviewed the images obtained:CT head - on Image 9 of the CT, there is a possible hypodensity of the left cerebellum which could represent remote infract, though I do not think I can be definite on this.   Impression: 68 yo M with transient vertigo and dysequibrium. Without current symptoms difficult to say if this was truly central, but with risk factors and symptoms even when not moving his head, I think that TIA is a good possibility.   Recommendations: 1) CTA head and neck 2) Echo 3) no need for lipid panel as recently done on 3/25, LDL 92 4) continue aggrenox for secondary stroke prevention.  5) check a1c 6) no need for dopplers as CTA will be done.  7) PM interrogation    Roland Rack, MD [facility] [phone]  If 7pm- 7am, please page neurology on call as listed in [place].

## 2013-08-07 NOTE — Progress Notes (Signed)
DC orders received.  Patient stable with no S/S of distress.  Medication and discharge information reviewed with patient and patient's wife.  Patient DC home with wife. Lance [name]  

## 2013-08-07 NOTE — ED Notes (Signed)
Pt transported to CT scan with this RN.

## 2013-08-07 NOTE — Discharge Instructions (Signed)
STROKE/TIA DISCHARGE INSTRUCTIONS SMOKING Cigarette smoking nearly doubles your risk of having a stroke & is the single most alterable risk factor  If you smoke or have smoked in the last 12 months, you are advised to quit smoking for your health.  Most of the excess cardiovascular risk related to smoking disappears within a year of stopping.  Ask you doctor about anti-smoking medications  [place] Quit Line: 1-800-QUIT NOW  Free Smoking Cessation Classes [phone]  CHOLESTEROL Know your levels; limit fat & cholesterol in your diet  Lipid Panel     Component Value Date/Time   CHOL 154 07/25/2013 0730   TRIG 155* 07/25/2013 0730   HDL 31* 07/25/2013 0730   CHOLHDL 5.0 07/25/2013 0730   VLDL 31 07/25/2013 0730   LDLCALC 92 07/25/2013 0730      Many patients benefit from treatment even if their cholesterol is at goal.  Goal: Total Cholesterol (CHOL) less than 160  Goal:  Triglycerides (TRIG) less than 150  Goal:  HDL greater than 40  Goal:  LDL (LDLCALC) less than 100   BLOOD PRESSURE American Stroke Association blood pressure target is less that 120/80 mm/Hg  Your discharge blood pressure is:  BP: 134/78 mmHg  Monitor your blood pressure  Limit your salt and alcohol intake  Many individuals will require more than one medication for high blood pressure  DIABETES (A1c is a blood sugar average for last 3 months) Goal HGBA1c is under 7% (HBGA1c is blood sugar average for last 3 months)  Diabetes: Diagnosis of diabetes:  Your A1c:7.8 %    Lab Results  Component Value Date   HGBA1C 7.8* 01/07/2012     Your HGBA1c can be lowered with medications, healthy diet, and exercise.  Check your blood sugar as directed by your physician  Call your physician if you experience unexplained or low blood sugars.  PHYSICAL ACTIVITY/REHABILITATION Goal is 30 minutes at least 4 days per week  Activity: Increase activity slowly, Therapies: Return to work:   Activity decreases your risk of heart  attack and stroke and makes your heart stronger.  It helps control your weight and blood pressure; helps you relax and can improve your mood.  Participate in a regular exercise program.  Talk with your doctor about the best form of exercise for you (dancing, walking, swimming, cycling).  DIET/WEIGHT Goal is to maintain a healthy weight  Your discharge diet is: Carb Control Your height is:  Height: 5\' 9"  (175.3 cm) Your current weight is: Weight: 91.445 kg (201 lb 9.6 oz) Your Body Mass Index (BMI) is:  BMI (Calculated): 29.8  Following the type of diet specifically designed for you will help prevent another stroke.  Your goal weight range is:  140-178 lbs  Your goal Body Mass Index (BMI) is 19-24.  Healthy food habits can help reduce 3 risk factors for stroke:  High cholesterol, hypertension, and excess weight.  RESOURCES Stroke/Support Group:  Call [phone]   STROKE EDUCATION PROVIDED/REVIEWED AND GIVEN TO PATIENT Stroke warning signs and symptoms How to activate emergency medical system (call 911). Medications prescribed at discharge. Need for follow-up after discharge. Personal risk factors for stroke. Pneumonia vaccine given: No Flu vaccine given: No My questions have been answered, the writing is legible, and I understand these instructions.  I will adhere to these goals & educational materials that have been provided to me after my discharge from the hospital.

## 2013-08-08 ENCOUNTER — Telehealth: Payer: Self-pay | Admitting: Neurology

## 2013-08-08 NOTE — Telephone Encounter (Signed)
Pt's wife called.  She stated that Lance [name] was discharged from the hospital on 08-07-13 for a TIA. Per the instructions on the discharge papers it states that he needs to be seen by Dr. Jannifer Franklin in 1 month.  Dr. Tobey Grim first available appointment is 11-15-13.  Please call Pt to see if Dr. Jannifer Franklin would be able to work him in sometime in May.  Thank you

## 2013-08-08 NOTE — Telephone Encounter (Signed)
Patient returning call, please call and advise.  °

## 2013-08-09 DIAGNOSIS — IMO0001 Reserved for inherently not codable concepts without codable children: Secondary | ICD-10-CM | POA: Diagnosis not present

## 2013-08-09 DIAGNOSIS — Z91199 Patient's noncompliance with other medical treatment and regimen due to unspecified reason: Secondary | ICD-10-CM | POA: Diagnosis not present

## 2013-08-09 DIAGNOSIS — I10 Essential (primary) hypertension: Secondary | ICD-10-CM | POA: Diagnosis not present

## 2013-08-09 DIAGNOSIS — Z9119 Patient's noncompliance with other medical treatment and regimen: Secondary | ICD-10-CM | POA: Diagnosis not present

## 2013-08-09 DIAGNOSIS — E785 Hyperlipidemia, unspecified: Secondary | ICD-10-CM | POA: Diagnosis not present

## 2013-08-09 DIAGNOSIS — E559 Vitamin D deficiency, unspecified: Secondary | ICD-10-CM | POA: Diagnosis not present

## 2013-08-10 ENCOUNTER — Encounter: Payer: Self-pay | Admitting: Cardiology

## 2013-08-10 ENCOUNTER — Ambulatory Visit (INDEPENDENT_AMBULATORY_CARE_PROVIDER_SITE_OTHER): Payer: Medicare Other | Admitting: Cardiology

## 2013-08-10 VITALS — BP 127/70 | HR 84 | Ht 69.0 in | Wt 212.0 lb

## 2013-08-10 DIAGNOSIS — E785 Hyperlipidemia, unspecified: Secondary | ICD-10-CM

## 2013-08-10 DIAGNOSIS — I2510 Atherosclerotic heart disease of native coronary artery without angina pectoris: Secondary | ICD-10-CM

## 2013-08-10 DIAGNOSIS — R079 Chest pain, unspecified: Secondary | ICD-10-CM | POA: Diagnosis not present

## 2013-08-10 MED ORDER — ATORVASTATIN CALCIUM 80 MG PO TABS: 80.0000 mg | ORAL_TABLET | Freq: Every day | ORAL | Status: DC

## 2013-08-10 MED ORDER — AMLODIPINE BESYLATE 5 MG PO TABS: 5.0000 mg | ORAL_TABLET | Freq: Every day | ORAL | Status: DC

## 2013-08-10 NOTE — Patient Instructions (Signed)
Your physician recommends that you schedule a follow-up appointment in: 3 months   Your physician has recommended you make the following change in your medication:    Start Norvasc 5 mg daily  Stop Simvastatin  Start Lipitor 80 mg daily    Your physician has requested that you have a lexiscan myoview. For further information please visit [email or web address]. Please follow instruction sheet, as given.  Thank you for choosing [facility] !
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
## 2013-08-13 ENCOUNTER — Other Ambulatory Visit: Payer: Self-pay | Admitting: Family Medicine

## 2013-08-20 ENCOUNTER — Encounter (HOSPITAL_COMMUNITY): Payer: Self-pay

## 2013-08-20 ENCOUNTER — Encounter (HOSPITAL_COMMUNITY)
Admission: RE | Admit: 2013-08-20 | Discharge: 2013-08-20 | Disposition: A | Discharge status: Home / Self Care | Payer: Medicare Other | Source: Ambulatory Visit | Attending: Cardiology | Admitting: Cardiology

## 2013-08-20 DIAGNOSIS — R079 Chest pain, unspecified: Secondary | ICD-10-CM | POA: Insufficient documentation

## 2013-08-20 HISTORY — DX: Heart failure, unspecified: I50.9

## 2013-08-20 MED ORDER — SODIUM CHLORIDE 0.9 % IJ SOLN
INTRAMUSCULAR | Status: AC
  Administered 2013-08-20: 10 mL via INTRAVENOUS
  Filled 2013-08-20: qty 10

## 2013-08-20 MED ORDER — TECHNETIUM TC 99M SESTAMIBI GENERIC - CARDIOLITE
30.0000 | Freq: Once | INTRAVENOUS | Status: AC | PRN
Start: 2013-08-20 — End: 2013-08-20
  Administered 2013-08-20: 30 via INTRAVENOUS

## 2013-08-20 MED ORDER — TECHNETIUM TC 99M SESTAMIBI - CARDIOLITE
10.0000 | Freq: Once | INTRAVENOUS | Status: AC | PRN
  Administered 2013-08-20: 09:00:00 10 via INTRAVENOUS

## 2013-08-20 MED ORDER — REGADENOSON 0.4 MG/5ML IV SOLN
INTRAVENOUS | Status: AC
  Administered 2013-08-20: 0.4 mg via INTRAVENOUS
  Filled 2013-08-20: qty 5

## 2013-08-20 NOTE — Progress Notes (Signed)
Stress Lab Nurses Notes - Johsua Shevlin 08/20/2013 Reason for doing test: CAD and Chest Pain Type of test: Wille Glaser Nurse performing test: Gerrit Halls, RN Nuclear Medicine Tech: Melburn Hake Echo Tech: Not Applicable MD performing test: Branch / K.Purcell Nails NP Family MD: Sallee Lange Test explained and consent signed: yes IV started: 22g jelco, Saline lock flushed, No redness or edema and Saline lock started in radiology Symptoms: SOB Treatment/Intervention: None Reason test stopped: protocol completed After recovery IV was: Discontinued via X-ray tech and No redness or edema Patient to return to [place]. Med at :10:40 Patient discharged: Home Patient's Condition upon discharge was: stable Comments: During test BP 104/61 & HR 78.  Recovery BP 100/65 & HR 74.  Symptoms resolved in recovery. Donnajean Lopes

## 2013-08-22 ENCOUNTER — Encounter: Payer: Self-pay | Admitting: Family Medicine

## 2013-08-22 ENCOUNTER — Ambulatory Visit (INDEPENDENT_AMBULATORY_CARE_PROVIDER_SITE_OTHER): Payer: Medicare Other | Admitting: Family Medicine

## 2013-08-22 VITALS — BP 118/62 | Ht 69.0 in | Wt 215.0 lb

## 2013-08-22 DIAGNOSIS — G459 Transient cerebral ischemic attack, unspecified: Secondary | ICD-10-CM

## 2013-08-22 DIAGNOSIS — I2510 Atherosclerotic heart disease of native coronary artery without angina pectoris: Secondary | ICD-10-CM

## 2013-08-22 DIAGNOSIS — I951 Orthostatic hypotension: Secondary | ICD-10-CM

## 2013-08-22 DIAGNOSIS — R5381 Other malaise: Secondary | ICD-10-CM

## 2013-08-22 DIAGNOSIS — Z125 Encounter for screening for malignant neoplasm of prostate: Secondary | ICD-10-CM

## 2013-08-22 DIAGNOSIS — I10 Essential (primary) hypertension: Secondary | ICD-10-CM | POA: Diagnosis not present

## 2013-08-22 DIAGNOSIS — E119 Type 2 diabetes mellitus without complications: Secondary | ICD-10-CM

## 2013-08-22 DIAGNOSIS — R5383 Other fatigue: Secondary | ICD-10-CM

## 2013-08-22 NOTE — Progress Notes (Signed)
   Subjective:    Patient ID: Lance [name], male    DOB: 1945/09/29, 68 y.o.   MRN: [id number]  HPI Patient arrives for follow up from hospitalization. Patient seen for dizziness-possible TIA.  Er notes and test reviewed Pt recounted his story Severe vertigo and instability Glucose was 178, very nausea.had severe sweat with it Discussion was held regarding his hospitalization as well as his symptoms and resolution of the symptoms. Please see history and physical note from the hospitalization as well as discharge summary plus all the tests. All of this was reviewed with the patient as well. Pt had severe nausea and vomiting , took 4 hours to settle Pt with significant risk factors for stroke Review of Systems  Constitutional: Negative for activity change, appetite change and fatigue.  Respiratory: Negative for shortness of breath.   Cardiovascular: Negative for chest pain.  Gastrointestinal: Positive for nausea and vomiting. Negative for abdominal pain.  Endocrine: Negative for polydipsia and polyphagia.  Neurological: Positive for light-headedness. Negative for dizziness, weakness and headaches.  Psychiatric/Behavioral: Negative for confusion.       Objective:   Physical Exam Finger to nose normal Romberg negative but he does waver Lungs are clear Heart regular Abdomen soft Extremities no edema Blood pressure laying 110/70 sitting 108/65 standing 90/60      Assessment & Plan:  #1 threatened stroke/TIA-he is back to baseline now. With his symptoms being persistent over 4 hours and was not based upon being able to get the symptoms to calm down with no movement I really do believe that this was a threatened posterior stroke. He is wise to continue on the medical regimen he is on. He is also encouraged to followup with the neurologist. Patient was told that the best way to minimize risk of stroke was sticking with medication, keeping blood pressure under control, keeping A1c as  best as possible, and good cholesterol control.  #2 relative hypotension. Blood pressure was checked laying sitting and standing. Patient at times gets lightheaded with standing up. He was encouraged to communicate with his cardiologist if medication might need a slight reduction.  #3 fatigue and tiredness patient states she has waves of tiredness that hit him that are not related to her sugar being low. We will check thyroid function but this could be his blood pressure triggering this he may followup here if any issues.  Screening PSA today. 25 minutes spent with the patient [id number] greater than half in discussion

## 2013-08-25 DIAGNOSIS — Z125 Encounter for screening for malignant neoplasm of prostate: Secondary | ICD-10-CM | POA: Diagnosis not present

## 2013-08-25 DIAGNOSIS — R5381 Other malaise: Secondary | ICD-10-CM | POA: Diagnosis not present

## 2013-08-25 LAB — TSH: TSH: 2.747 u[IU]/mL (ref 0.350–4.500)

## 2013-08-27 ENCOUNTER — Encounter: Payer: Self-pay | Admitting: Family Medicine

## 2013-08-27 LAB — PSA, MEDICARE: PSA: 0.82 ng/mL (ref <=4.00)

## 2013-08-30 ENCOUNTER — Other Ambulatory Visit: Payer: Self-pay | Admitting: Family Medicine

## 2013-09-03 ENCOUNTER — Encounter: Payer: Self-pay | Admitting: Internal Medicine

## 2013-09-03 ENCOUNTER — Ambulatory Visit (INDEPENDENT_AMBULATORY_CARE_PROVIDER_SITE_OTHER): Payer: Medicare Other | Admitting: *Deleted

## 2013-09-03 DIAGNOSIS — I428 Other cardiomyopathies: Secondary | ICD-10-CM | POA: Diagnosis not present

## 2013-09-03 DIAGNOSIS — I469 Cardiac arrest, cause unspecified: Secondary | ICD-10-CM

## 2013-09-05 ENCOUNTER — Ambulatory Visit (INDEPENDENT_AMBULATORY_CARE_PROVIDER_SITE_OTHER): Payer: Medicare Other | Admitting: Neurology

## 2013-09-05 ENCOUNTER — Encounter: Payer: Self-pay | Admitting: Neurology

## 2013-09-05 VITALS — BP 146/84 | HR 74 | Wt 222.0 lb

## 2013-09-05 DIAGNOSIS — R42 Dizziness and giddiness: Secondary | ICD-10-CM

## 2013-09-05 DIAGNOSIS — I2510 Atherosclerotic heart disease of native coronary artery without angina pectoris: Secondary | ICD-10-CM | POA: Diagnosis not present

## 2013-09-05 DIAGNOSIS — R413 Other amnesia: Secondary | ICD-10-CM | POA: Insufficient documentation

## 2013-09-05 HISTORY — DX: Other amnesia: R41.3

## 2013-09-05 MED ORDER — DONEPEZIL HCL 5 MG PO TABS: 5.0000 mg | ORAL_TABLET | Freq: Every day | ORAL | Status: DC

## 2013-09-05 NOTE — Patient Instructions (Signed)
   Begin Aricept (donepezil) at 5 mg at night for one month. If this medication is well-tolerated, please call our office and we will call in a prescription for the 10 mg tablets. Look out for side effects that may include nausea, diarrhea, weight loss, or stomach cramps. This medication will also cause a runny nose, therefore there is no need for allergy medications for this purpose.  
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
## 2013-09-06 LAB — MDC_IDC_ENUM_SESS_TYPE_REMOTE
Battery Remaining Longevity: 80 mo
Battery Remaining Percentage: 82 %
Battery Voltage: 2.99 V
Brady Statistic AP VP Percent: 0 %
Brady Statistic AP VS Percent: 1 %
Brady Statistic AS VP Percent: 1 %
Brady Statistic AS VS Percent: 99 %
Brady Statistic RA Percent Paced: 1 %
Brady Statistic RV Percent Paced: 1 %
Date Time Interrogation Session: 20150504060021
HighPow Impedance: 34 Ohm
Implantable Pulse Generator Serial Number: 7041246
Lead Channel Impedance Value: 300 Ohm
Lead Channel Impedance Value: 440 Ohm
Lead Channel Pacing Threshold Amplitude: 1 V
Lead Channel Pacing Threshold Amplitude: 1 V
Lead Channel Pacing Threshold Pulse Width: 0.5 ms
Lead Channel Pacing Threshold Pulse Width: 0.5 ms
Lead Channel Sensing Intrinsic Amplitude: 11.3 mV
Lead Channel Sensing Intrinsic Amplitude: 2.3 mV
Lead Channel Setting Pacing Amplitude: 2 V
Lead Channel Setting Pacing Amplitude: 2.5 V
Lead Channel Setting Pacing Pulse Width: 0.5 ms
Lead Channel Setting Sensing Sensitivity: 0.5 mV
Zone Setting Detection Interval: 280 ms
Zone Setting Detection Interval: 320 ms

## 2013-09-11 DIAGNOSIS — B351 Tinea unguium: Secondary | ICD-10-CM | POA: Diagnosis not present

## 2013-09-11 DIAGNOSIS — E1149 Type 2 diabetes mellitus with other diabetic neurological complication: Secondary | ICD-10-CM | POA: Diagnosis not present

## 2013-09-13 ENCOUNTER — Encounter: Payer: Self-pay | Admitting: Cardiology

## 2013-09-21 ENCOUNTER — Telehealth: Payer: Self-pay | Admitting: Family Medicine

## 2013-09-21 NOTE — Telephone Encounter (Signed)
done

## 2013-09-21 NOTE — Telephone Encounter (Signed)
Patient needs form filled out for handicap placard.

## 2013-09-27 ENCOUNTER — Other Ambulatory Visit: Payer: Self-pay | Admitting: Neurology

## 2013-09-27 ENCOUNTER — Other Ambulatory Visit: Payer: Self-pay | Admitting: Gastroenterology

## 2013-09-27 NOTE — Telephone Encounter (Signed)
I tried to call the patient to see how he was tolerating this medication, to see if we should proceed with the next dose, 10mg. Got no answer. Will auth refill with a note asking that the patient contact us regarding this med, as we want to ensure he does not run out of medication.   

## 2013-09-28 NOTE — Telephone Encounter (Signed)
Please clarify. Patient was on Dexilant per last OV note but request pantoprazole??

## 2013-10-02 DIAGNOSIS — Z79899 Other long term (current) drug therapy: Secondary | ICD-10-CM | POA: Diagnosis not present

## 2013-10-02 DIAGNOSIS — G894 Chronic pain syndrome: Secondary | ICD-10-CM | POA: Diagnosis not present

## 2013-10-02 NOTE — Telephone Encounter (Signed)
What is status of this clarification?

## 2013-10-02 NOTE — Telephone Encounter (Signed)
LMOM to call back

## 2013-10-03 NOTE — Telephone Encounter (Signed)
He take the pantoprazole because the Dexilant was not helping him.

## 2013-10-09 ENCOUNTER — Other Ambulatory Visit: Payer: Self-pay | Admitting: Family Medicine

## 2013-10-18 ENCOUNTER — Other Ambulatory Visit: Payer: Self-pay | Admitting: Neurology

## 2013-10-18 NOTE — Telephone Encounter (Signed)
I tried to call the patient to see how he was tolerating this medication, to see if we should proceed with the next dose, 10mg . Got no answer. Will auth refill with a note asking that the patient contact us regarding this med, as we want to ensure he does not run out of medication.

## 2013-11-04 ENCOUNTER — Other Ambulatory Visit: Payer: Self-pay | Admitting: Neurology

## 2013-11-05 ENCOUNTER — Ambulatory Visit (INDEPENDENT_AMBULATORY_CARE_PROVIDER_SITE_OTHER): Payer: Medicare Other | Admitting: Family Medicine

## 2013-11-05 ENCOUNTER — Encounter: Payer: Self-pay | Admitting: Family Medicine

## 2013-11-05 VITALS — BP 122/74 | Temp 99.5°F | Wt 217.0 lb

## 2013-11-05 DIAGNOSIS — IMO0001 Reserved for inherently not codable concepts without codable children: Secondary | ICD-10-CM | POA: Diagnosis not present

## 2013-11-05 DIAGNOSIS — I2510 Atherosclerotic heart disease of native coronary artery without angina pectoris: Secondary | ICD-10-CM

## 2013-11-05 DIAGNOSIS — J329 Chronic sinusitis, unspecified: Secondary | ICD-10-CM

## 2013-11-05 DIAGNOSIS — J310 Chronic rhinitis: Secondary | ICD-10-CM

## 2013-11-05 DIAGNOSIS — E785 Hyperlipidemia, unspecified: Secondary | ICD-10-CM | POA: Diagnosis not present

## 2013-11-05 DIAGNOSIS — I10 Essential (primary) hypertension: Secondary | ICD-10-CM | POA: Diagnosis not present

## 2013-11-05 MED ORDER — CEFDINIR 300 MG PO CAPS: 300.0000 mg | ORAL_CAPSULE | Freq: Two times a day (BID) | ORAL | Status: DC

## 2013-11-05 NOTE — Telephone Encounter (Signed)
Last OV says: The patient has had a change in memory gradually over time. At this point, we will start Aricept, and we will follow the memory over time. He will followup in 6 months. If he tolerates the 5 mg Aricept dose after one month, he will contact our office, and we will increase the dose to 10 mg at night. I called the patient.  Spoke with Ms Lewin.  She said the patient is doing well on 5mg  and is agreeable to him taking 10mg .  She will call us back if there are any issues with this dose.

## 2013-11-05 NOTE — Progress Notes (Signed)
   Subjective:    Patient ID: Lance [name], male    DOB: March 24, 1946, 68 y.o.   MRN: [id number]  Cough This is a new problem. The current episode started in the past 7 days. Associated symptoms include headaches and a sore throat. Associated symptoms comments: Runny nose. He has tried nothing for the symptoms.    Os prod cough, no fever  Headache on the frontal region  Diminished energy  achey  Review of Systems  HENT: Positive for sore throat.   Respiratory: Positive for cough.   Neurological: Positive for headaches.       Objective:   Physical Exam  Alert moderate malaise. H&T moderate his congestion pharynx slight erythema neck supple. Lungs bronchial cough no crackles no wheezes no tachypnea heart regular rate and rhythm.      Assessment & Plan:  Impression rhinosinusitis/bronchitis plan antibiotics prescribed. Symptomatic care discussed. #2 at the end of the visit patient stated he had "1 question" notes that he feels dizzy when he turns his eyes from one direction together. Wonders if she should come back to see Dr. Nicki [name] or describe it to his eye Dr. in a few weeks. When asked what the neurologist just 2 months ago said about his dizziness, he replied like usual they didn't know. When I asked when patient is normally following up with Dr. Nicki [name] he did not note. Dr. Bary [name] note unclear. Last seen in April do to complexity recommend a three-month followup. WSL

## 2013-11-07 ENCOUNTER — Encounter: Payer: Self-pay | Admitting: Internal Medicine

## 2013-11-08 DIAGNOSIS — IMO0001 Reserved for inherently not codable concepts without codable children: Secondary | ICD-10-CM | POA: Diagnosis not present

## 2013-11-08 DIAGNOSIS — E559 Vitamin D deficiency, unspecified: Secondary | ICD-10-CM | POA: Diagnosis not present

## 2013-11-08 DIAGNOSIS — I10 Essential (primary) hypertension: Secondary | ICD-10-CM | POA: Diagnosis not present

## 2013-11-08 DIAGNOSIS — E785 Hyperlipidemia, unspecified: Secondary | ICD-10-CM | POA: Diagnosis not present

## 2013-11-12 ENCOUNTER — Encounter: Payer: Self-pay | Admitting: Family Medicine

## 2013-11-12 ENCOUNTER — Other Ambulatory Visit: Payer: Self-pay | Admitting: Family Medicine

## 2013-11-12 ENCOUNTER — Ambulatory Visit (INDEPENDENT_AMBULATORY_CARE_PROVIDER_SITE_OTHER): Payer: Medicare Other | Admitting: Family Medicine

## 2013-11-12 VITALS — BP 136/88 | Temp 98.7°F | Ht 69.0 in | Wt 217.0 lb

## 2013-11-12 DIAGNOSIS — R197 Diarrhea, unspecified: Secondary | ICD-10-CM | POA: Diagnosis not present

## 2013-11-12 DIAGNOSIS — R42 Dizziness and giddiness: Secondary | ICD-10-CM

## 2013-11-12 DIAGNOSIS — I2510 Atherosclerotic heart disease of native coronary artery without angina pectoris: Secondary | ICD-10-CM | POA: Diagnosis not present

## 2013-11-12 DIAGNOSIS — J329 Chronic sinusitis, unspecified: Secondary | ICD-10-CM | POA: Diagnosis not present

## 2013-11-12 DIAGNOSIS — I951 Orthostatic hypotension: Secondary | ICD-10-CM

## 2013-11-12 DIAGNOSIS — J310 Chronic rhinitis: Secondary | ICD-10-CM

## 2013-11-12 MED ORDER — DOXYCYCLINE HYCLATE 100 MG PO TABS: 100.0000 mg | ORAL_TABLET | Freq: Two times a day (BID) | ORAL | Status: DC

## 2013-11-12 MED ORDER — LOSARTAN POTASSIUM 50 MG PO TABS: 50.0000 mg | ORAL_TABLET | Freq: Every day | ORAL | Status: DC

## 2013-11-12 NOTE — Progress Notes (Addendum)
   Subjective:    Patient ID: Lance [name], male    DOB: [birth year], 68 y.o.   MRN: [id number]  HPI Patient is here for the same s/s that he had on 7/6 when he saw Dr. Richardson Landry.  He c/o sinus drainage, HA, sinus pressure, ear congestion, and diarrhea for 2 weeks now.    He has diarrhea about 4 times a day.Started last Thursday. Also some at night, stools mucousy, also nausea No V No fever Moderate haed congestion/thick phlegm A little wheeze No allergy sx Patient finds himself feeling dizzy when he stands up. Gets lightheaded. Denies unilateral numbness or weakness. Patient does relate a lot of head congestion drainage coughing denies wheezing or shortness of breath denies nausea vomiting currently Has a history of peripheral vascular disease stroke and heart disease Pt is taking omnicef.   Pt feels the same.     Review of Systems  Constitutional: Negative for fever and activity change.  HENT: Positive for congestion and rhinorrhea. Negative for ear pain.   Eyes: Negative for discharge.  Respiratory: Positive for cough. Negative for wheezing.   Cardiovascular: Negative for chest pain.       Objective:   Physical Exam  Nursing note and vitals reviewed. Constitutional: He appears well-developed.  HENT:  Head: Normocephalic.  Mouth/Throat: Oropharynx is clear and moist. No oropharyngeal exudate.  Neck: Normal range of motion.  Cardiovascular: Normal rate, regular rhythm and normal heart sounds.   No murmur heard. Pulmonary/Chest: Effort normal and breath sounds normal. He has no wheezes.  Abdominal: Soft. There is no tenderness. There is no rebound and no guarding.  Lymphadenopathy:    He has no cervical adenopathy.  Neurological: He exhibits normal muscle tone.  Skin: Skin is warm and dry.   Neurologically I find no evidence of a stroke       Assessment & Plan:  1. Rhinosinusitis Antibiotics prescribed Flonase spray followup if ongoing troubles warnings  discussed  2. Orthostatic hypotension Reduction in medication was done. I am concerned regarding this issue we'll recheck him in several weeks.  3. Dizziness and giddiness I think Mrs. multi-factorial but blood pressure plays a role no sign of stroke  4. Diarrhea Frequent loose stools could be related to antibiotics, await test results may need Flagyl warnings discussed - Stool culture - Fecal lactoferrin - Clostridium difficile EIA   Trazodone was also stopped because it wasn't thought it was helping him. Also reduction of Neurontin to just one per day was done because it wasn't thought it was helping him and could be contributing to his dizziness

## 2013-11-12 NOTE — Patient Instructions (Addendum)
Sinus  New round of antibiotic  Allergy nasal spray/ should get better  Dizziness  Blood pressure too low   Will change BP med to Losartan 50   Continue amlodipine  Neuropathy  Not sure that gabapentin is helping much  Reduce it to 1 per day  This reduction might help your dizziness Diarrhea  Use probiotic ( OTC) one daily  Do stool test  If not better over the next several days then will need to add medication  If Trazadone is not helping with sleep then stop it  Return in 6 weeks with all your meds for a recheck with me

## 2013-11-13 DIAGNOSIS — H3582 Retinal ischemia: Secondary | ICD-10-CM | POA: Diagnosis not present

## 2013-11-13 DIAGNOSIS — E1165 Type 2 diabetes mellitus with hyperglycemia: Secondary | ICD-10-CM | POA: Diagnosis not present

## 2013-11-13 DIAGNOSIS — H4310 Vitreous hemorrhage, unspecified eye: Secondary | ICD-10-CM | POA: Diagnosis not present

## 2013-11-13 DIAGNOSIS — E11359 Type 2 diabetes mellitus with proliferative diabetic retinopathy without macular edema: Secondary | ICD-10-CM | POA: Diagnosis not present

## 2013-11-13 DIAGNOSIS — E1139 Type 2 diabetes mellitus with other diabetic ophthalmic complication: Secondary | ICD-10-CM | POA: Diagnosis not present

## 2013-11-14 ENCOUNTER — Telehealth: Payer: Self-pay | Admitting: *Deleted

## 2013-11-14 LAB — CLOSTRIDIUM DIFFICILE BY PCR: Toxigenic C. Difficile by PCR: DETECTED — CR

## 2013-11-14 LAB — FECAL LACTOFERRIN, QUANT: Lactoferrin: NEGATIVE

## 2013-11-14 LAB — C. DIFFICILE GDH AND TOXIN A/B
C. difficile GDH: DETECTED — AB
C. difficile Toxin A/B: NOT DETECTED

## 2013-11-14 MED ORDER — METRONIDAZOLE 500 MG PO TABS: 500.0000 mg | ORAL_TABLET | Freq: Three times a day (TID) | ORAL | Status: DC

## 2013-11-14 NOTE — Telephone Encounter (Signed)
Lab called to report c diff was detected. Lab report in is epic.

## 2013-11-14 NOTE — Telephone Encounter (Signed)
Notified Lance [name] that his test is positive for Clostridium difficile. This is causing the diarrhea. Recommend Flagyl 500 mg 1 3 times a day for the next 7 days. If that does not stop the diarrhea he needs to let us know. If bloody stools high fever or worse followup here immediately or to ER. Also may use probiotic on a daily basis. Let us know if things are not significantly better within one week there. [name] verbalized understanding.

## 2013-11-14 NOTE — Telephone Encounter (Signed)
Please inform the patient that his test is positive for Clostridium difficile. This is causing the diarrhea. I recommend Flagyl 500 mg 1 3 times a day for the next 7 days. If that does not stop the diarrhea he needs to let us know. If bloody stools high fever or worse followup here immediately or to ER. Also may use probiotic on a daily basis. Let us know if things are not significantly better within one week there

## 2013-11-15 ENCOUNTER — Other Ambulatory Visit: Payer: Self-pay | Admitting: *Deleted

## 2013-11-15 ENCOUNTER — Telehealth: Payer: Self-pay | Admitting: Family Medicine

## 2013-11-15 MED ORDER — METRONIDAZOLE 500 MG PO TABS: 500.0000 mg | ORAL_TABLET | Freq: Three times a day (TID) | ORAL | Status: DC

## 2013-11-15 NOTE — Telephone Encounter (Signed)
Med resent to pharm. Pt's wife notified.

## 2013-11-15 NOTE — Telephone Encounter (Signed)
metroNIDAZOLE (FLAGYL) 500 MG tablet  Pt states this med was not received by [facility] Can we resend this please

## 2013-11-17 LAB — STOOL CULTURE

## 2013-11-20 DIAGNOSIS — B351 Tinea unguium: Secondary | ICD-10-CM | POA: Diagnosis not present

## 2013-11-20 DIAGNOSIS — E1149 Type 2 diabetes mellitus with other diabetic neurological complication: Secondary | ICD-10-CM | POA: Diagnosis not present

## 2013-11-26 ENCOUNTER — Telehealth: Payer: Self-pay | Admitting: *Deleted

## 2013-11-26 NOTE — Telephone Encounter (Signed)
Called and spoke to patient's wife in regards to appointment 7/27 @ 10. She will call back to confirm whether or not patient able to come in.

## 2013-11-28 DIAGNOSIS — E1139 Type 2 diabetes mellitus with other diabetic ophthalmic complication: Secondary | ICD-10-CM | POA: Diagnosis not present

## 2013-11-28 DIAGNOSIS — E1165 Type 2 diabetes mellitus with hyperglycemia: Secondary | ICD-10-CM | POA: Diagnosis not present

## 2013-11-28 DIAGNOSIS — E11359 Type 2 diabetes mellitus with proliferative diabetic retinopathy without macular edema: Secondary | ICD-10-CM | POA: Diagnosis not present

## 2013-11-29 ENCOUNTER — Telehealth: Payer: Self-pay | Admitting: Nurse Practitioner

## 2013-11-29 MED ORDER — METRONIDAZOLE 500 MG PO TABS: 500.0000 mg | ORAL_TABLET | Freq: Three times a day (TID) | ORAL | Status: DC

## 2013-11-29 NOTE — Telephone Encounter (Signed)
Med sent to pharm. Pt's wife notified.  

## 2013-11-29 NOTE — Telephone Encounter (Signed)
Flagyl 500 tid 7 d

## 2013-11-29 NOTE — Telephone Encounter (Signed)
Pt was diagnosed with cdiff an was called in flagyl He is still having issues an would like to know if he  Needs to have another round of this antibiotic called in?   If not he wishes to wait till Monday to see Dr Nicki Reaper   That med was sent 7/16   Please send to [facility] if possible

## 2013-12-05 ENCOUNTER — Ambulatory Visit (INDEPENDENT_AMBULATORY_CARE_PROVIDER_SITE_OTHER): Payer: Medicare Other | Admitting: *Deleted

## 2013-12-05 ENCOUNTER — Encounter: Payer: Self-pay | Admitting: Internal Medicine

## 2013-12-05 ENCOUNTER — Telehealth: Payer: Self-pay | Admitting: Cardiology

## 2013-12-05 NOTE — Telephone Encounter (Signed)
LMOVM reminding pt to send remote transmission.   

## 2013-12-06 ENCOUNTER — Encounter: Payer: Self-pay | Admitting: Cardiology

## 2013-12-07 DIAGNOSIS — I428 Other cardiomyopathies: Secondary | ICD-10-CM | POA: Diagnosis not present

## 2013-12-07 DIAGNOSIS — I509 Heart failure, unspecified: Secondary | ICD-10-CM

## 2013-12-07 LAB — MDC_IDC_ENUM_SESS_TYPE_REMOTE
Battery Remaining Longevity: 78 mo
Battery Remaining Percentage: 79 %
Battery Voltage: 2.99 V
Brady Statistic AP VP Percent: 1 %
Brady Statistic AP VS Percent: 1 %
Brady Statistic AS VP Percent: 1 %
Brady Statistic AS VS Percent: 99 %
Brady Statistic RA Percent Paced: 1 %
Brady Statistic RV Percent Paced: 1 %
Date Time Interrogation Session: 20150805063328
HighPow Impedance: 35 Ohm
Implantable Pulse Generator Serial Number: 7041246
Lead Channel Impedance Value: 300 Ohm
Lead Channel Impedance Value: 410 Ohm
Lead Channel Pacing Threshold Amplitude: 1 V
Lead Channel Pacing Threshold Amplitude: 1 V
Lead Channel Pacing Threshold Pulse Width: 0.5 ms
Lead Channel Pacing Threshold Pulse Width: 0.5 ms
Lead Channel Sensing Intrinsic Amplitude: 10.5 mV
Lead Channel Sensing Intrinsic Amplitude: 2.3 mV
Lead Channel Setting Pacing Amplitude: 2 V
Lead Channel Setting Pacing Amplitude: 2.5 V
Lead Channel Setting Pacing Pulse Width: 0.5 ms
Lead Channel Setting Sensing Sensitivity: 0.5 mV
Zone Setting Detection Interval: 280 ms
Zone Setting Detection Interval: 320 ms

## 2013-12-07 NOTE — Progress Notes (Signed)
Remote ICD transmission.   

## 2013-12-12 ENCOUNTER — Ambulatory Visit (INDEPENDENT_AMBULATORY_CARE_PROVIDER_SITE_OTHER): Payer: Medicare Other | Admitting: *Deleted

## 2013-12-12 DIAGNOSIS — I428 Other cardiomyopathies: Secondary | ICD-10-CM

## 2013-12-12 DIAGNOSIS — I509 Heart failure, unspecified: Secondary | ICD-10-CM

## 2013-12-13 NOTE — Progress Notes (Signed)
Remote ICD transmission.   

## 2013-12-14 ENCOUNTER — Encounter: Payer: Self-pay | Admitting: Family Medicine

## 2013-12-14 ENCOUNTER — Ambulatory Visit (INDEPENDENT_AMBULATORY_CARE_PROVIDER_SITE_OTHER): Payer: Medicare Other | Admitting: Family Medicine

## 2013-12-14 ENCOUNTER — Encounter: Payer: Self-pay | Admitting: Cardiology

## 2013-12-14 VITALS — BP 100/70 | Ht 69.0 in

## 2013-12-14 DIAGNOSIS — I951 Orthostatic hypotension: Secondary | ICD-10-CM

## 2013-12-14 DIAGNOSIS — I2510 Atherosclerotic heart disease of native coronary artery without angina pectoris: Secondary | ICD-10-CM

## 2013-12-14 NOTE — Progress Notes (Signed)
   Subjective:    Patient ID: Lance [name], male    DOB: 14-Jul-1945, 68 y.o.   MRN: [id number]  HPI Patient is still having dizzy spells, light-headedness, and has passed out twice since last Friday.   I did a set of orthostatic BPs.  He gets sick to his stomach, sweats a lot, and his blood sugars keep fluctuating from 215-70.   It will wake him from his sleep.  He saw his cardiologist recently as well since he was seen last.  This has been going on for the past 3 years, on and off. The sweats are getting worst. His lower extremities are cold, but his upper body is where he sweats.     Review of Systems PMH see above ROS denies chest pain shortness of breath swelling in the legs    Objective:   Physical Exam Orthostatics were repeated and confirmed that the patient does have significant orthostasis  Lungs clear heart regular no unilateral numbness or weakness     Assessment & Plan:  I feel that a significant portion of the patient's problem is due to orthostasis and I recommend reducing metoprolol from 100 mg twice daily, new dose 50 mg twice daily life will split the pill in half.  They will bring all of his medicines at the next visit to go over each medication. Patient feels like he is on too much and he would like to come off of it if he could

## 2013-12-15 LAB — MDC_IDC_ENUM_SESS_TYPE_REMOTE
Battery Remaining Longevity: 78 mo
Battery Remaining Percentage: 79 %
Battery Voltage: 2.98 V
Brady Statistic AP VP Percent: 1 %
Brady Statistic AP VS Percent: 1 %
Brady Statistic AS VP Percent: 1 %
Brady Statistic AS VS Percent: 99 %
Brady Statistic RA Percent Paced: 1 %
Brady Statistic RV Percent Paced: 1 %
Date Time Interrogation Session: 20150813002325
HighPow Impedance: 37 Ohm
Implantable Pulse Generator Serial Number: 7041246
Lead Channel Impedance Value: 310 Ohm
Lead Channel Impedance Value: 410 Ohm
Lead Channel Pacing Threshold Amplitude: 1 V
Lead Channel Pacing Threshold Amplitude: 1 V
Lead Channel Pacing Threshold Pulse Width: 0.5 ms
Lead Channel Pacing Threshold Pulse Width: 0.5 ms
Lead Channel Sensing Intrinsic Amplitude: 11.3 mV
Lead Channel Sensing Intrinsic Amplitude: 2.5 mV
Lead Channel Setting Pacing Amplitude: 2 V
Lead Channel Setting Pacing Amplitude: 2.5 V
Lead Channel Setting Pacing Pulse Width: 0.5 ms
Lead Channel Setting Sensing Sensitivity: 0.5 mV
Zone Setting Detection Interval: 280 ms
Zone Setting Detection Interval: 320 ms

## 2013-12-17 ENCOUNTER — Encounter: Payer: Self-pay | Admitting: Cardiology

## 2013-12-18 ENCOUNTER — Ambulatory Visit: Payer: Self-pay | Admitting: Neurology

## 2013-12-19 ENCOUNTER — Encounter: Payer: Self-pay | Admitting: Cardiology

## 2013-12-21 ENCOUNTER — Ambulatory Visit: Payer: Medicare Other | Admitting: Internal Medicine

## 2013-12-21 ENCOUNTER — Telehealth: Payer: Self-pay | Admitting: Internal Medicine

## 2013-12-21 DIAGNOSIS — E1149 Type 2 diabetes mellitus with other diabetic neurological complication: Secondary | ICD-10-CM | POA: Diagnosis not present

## 2013-12-21 DIAGNOSIS — S99919A Unspecified injury of unspecified ankle, initial encounter: Secondary | ICD-10-CM | POA: Diagnosis not present

## 2013-12-21 DIAGNOSIS — L97509 Non-pressure chronic ulcer of other part of unspecified foot with unspecified severity: Secondary | ICD-10-CM | POA: Diagnosis not present

## 2013-12-21 DIAGNOSIS — L02619 Cutaneous abscess of unspecified foot: Secondary | ICD-10-CM | POA: Diagnosis not present

## 2013-12-21 DIAGNOSIS — L03119 Cellulitis of unspecified part of limb: Secondary | ICD-10-CM | POA: Diagnosis not present

## 2013-12-21 DIAGNOSIS — S8990XA Unspecified injury of unspecified lower leg, initial encounter: Secondary | ICD-10-CM | POA: Diagnosis not present

## 2013-12-21 NOTE — Telephone Encounter (Signed)
Pt came in asking if he had an OV with RMR today and I said yes, it was at 0930 this morning. He said the dog chewed up his calendar. I rescheduled him to see RMR on 9/22 and gave him an appt card.

## 2013-12-21 NOTE — Telephone Encounter (Signed)
Pt was a no show

## 2013-12-24 ENCOUNTER — Ambulatory Visit (INDEPENDENT_AMBULATORY_CARE_PROVIDER_SITE_OTHER): Payer: Medicare Other | Admitting: Family Medicine

## 2013-12-24 ENCOUNTER — Encounter: Payer: Self-pay | Admitting: Family Medicine

## 2013-12-24 VITALS — BP 122/74 | Wt 220.0 lb

## 2013-12-24 DIAGNOSIS — Z23 Encounter for immunization: Secondary | ICD-10-CM | POA: Diagnosis not present

## 2013-12-24 DIAGNOSIS — R197 Diarrhea, unspecified: Secondary | ICD-10-CM | POA: Diagnosis not present

## 2013-12-24 DIAGNOSIS — I2510 Atherosclerotic heart disease of native coronary artery without angina pectoris: Secondary | ICD-10-CM | POA: Diagnosis not present

## 2013-12-24 DIAGNOSIS — I10 Essential (primary) hypertension: Secondary | ICD-10-CM | POA: Diagnosis not present

## 2013-12-24 MED ORDER — METOPROLOL TARTRATE 50 MG PO TABS: 50.0000 mg | ORAL_TABLET | Freq: Two times a day (BID) | ORAL | Status: DC

## 2013-12-24 MED ORDER — NITROGLYCERIN 0.4 MG SL SUBL: 0.4000 mg | SUBLINGUAL_TABLET | SUBLINGUAL | Status: DC | PRN

## 2013-12-24 NOTE — Progress Notes (Signed)
   Subjective:    Patient ID: Lance [name], male    DOB: 1946/01/18, 68 y.o.   MRN: [id number]  HPIFollow up on dizziness. Doing much better since reducing metoprolol to 1/2 BID.  Dizziness is much improved. Concerns about having soft stools up to four times daily.   Energy level doing okay.  Review of Systems Denies chest tightness pressure pain shortness breath relates soft loose stools. Denies bloody stools.    Objective:   Physical Exam His lungs are clear heart regular pulse normal blood pressure lying sitting standing look good extremities no edema       Assessment & Plan:  Orthostatic hypotension-he to do much better on the reduced dose of metoprolol we will re\re issue it at 50 mg twice daily  He follows with specialist for his diabetes.  Having frequent loose stools and diarrhea history of C. difficile we will check C. difficile PCR may need other intervention possibly vancomycin.  Followup patient approximately 3-4 months

## 2013-12-25 DIAGNOSIS — G894 Chronic pain syndrome: Secondary | ICD-10-CM | POA: Diagnosis not present

## 2013-12-26 ENCOUNTER — Other Ambulatory Visit: Payer: Self-pay | Admitting: Family Medicine

## 2013-12-26 DIAGNOSIS — E1165 Type 2 diabetes mellitus with hyperglycemia: Secondary | ICD-10-CM | POA: Diagnosis not present

## 2013-12-26 DIAGNOSIS — E11359 Type 2 diabetes mellitus with proliferative diabetic retinopathy without macular edema: Secondary | ICD-10-CM | POA: Diagnosis not present

## 2013-12-26 DIAGNOSIS — E1139 Type 2 diabetes mellitus with other diabetic ophthalmic complication: Secondary | ICD-10-CM | POA: Diagnosis not present

## 2013-12-27 ENCOUNTER — Other Ambulatory Visit: Payer: Self-pay | Admitting: Family Medicine

## 2013-12-27 NOTE — Telephone Encounter (Signed)
Ok plus two monthly ref 

## 2013-12-28 LAB — CLOSTRIDIUM DIFFICILE BY PCR: Toxigenic C. Difficile by PCR: NOT DETECTED

## 2013-12-31 DIAGNOSIS — L97509 Non-pressure chronic ulcer of other part of unspecified foot with unspecified severity: Secondary | ICD-10-CM | POA: Diagnosis not present

## 2013-12-31 DIAGNOSIS — E1149 Type 2 diabetes mellitus with other diabetic neurological complication: Secondary | ICD-10-CM | POA: Diagnosis not present

## 2013-12-31 DIAGNOSIS — L03119 Cellulitis of unspecified part of limb: Secondary | ICD-10-CM | POA: Diagnosis not present

## 2013-12-31 DIAGNOSIS — L02619 Cutaneous abscess of unspecified foot: Secondary | ICD-10-CM | POA: Diagnosis not present

## 2014-01-03 ENCOUNTER — Encounter: Payer: Self-pay | Admitting: Internal Medicine

## 2014-01-09 DIAGNOSIS — E1149 Type 2 diabetes mellitus with other diabetic neurological complication: Secondary | ICD-10-CM | POA: Diagnosis not present

## 2014-01-09 DIAGNOSIS — L851 Acquired keratosis [keratoderma] palmaris et plantaris: Secondary | ICD-10-CM | POA: Diagnosis not present

## 2014-01-18 ENCOUNTER — Other Ambulatory Visit: Payer: Self-pay | Admitting: Family Medicine

## 2014-01-22 ENCOUNTER — Encounter: Payer: Self-pay | Admitting: *Deleted

## 2014-01-22 ENCOUNTER — Ambulatory Visit (INDEPENDENT_AMBULATORY_CARE_PROVIDER_SITE_OTHER): Payer: Medicare Other | Admitting: Internal Medicine

## 2014-01-22 ENCOUNTER — Encounter: Payer: Self-pay | Admitting: Internal Medicine

## 2014-01-22 VITALS — BP 155/84 | HR 75 | Temp 98.4°F | Ht 70.0 in | Wt 223.4 lb

## 2014-01-22 DIAGNOSIS — I2510 Atherosclerotic heart disease of native coronary artery without angina pectoris: Secondary | ICD-10-CM | POA: Diagnosis not present

## 2014-01-22 DIAGNOSIS — K219 Gastro-esophageal reflux disease without esophagitis: Secondary | ICD-10-CM

## 2014-01-22 LAB — HEMOGLOBIN A1C: A1c: 8.6
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
## 2014-01-22 NOTE — Patient Instructions (Signed)
Try and decrease Dexilant to 60 mg daily; may take a second dose in the evening only if needed  GERD information provided  Office visit in 9 months

## 2014-01-28 ENCOUNTER — Ambulatory Visit (INDEPENDENT_AMBULATORY_CARE_PROVIDER_SITE_OTHER): Payer: Medicare Other | Admitting: Family Medicine

## 2014-01-28 VITALS — BP 140/94 | Temp 98.7°F | Ht 69.0 in | Wt 220.0 lb

## 2014-01-28 DIAGNOSIS — J019 Acute sinusitis, unspecified: Secondary | ICD-10-CM

## 2014-01-28 DIAGNOSIS — I2510 Atherosclerotic heart disease of native coronary artery without angina pectoris: Secondary | ICD-10-CM

## 2014-01-28 MED ORDER — DOXYCYCLINE HYCLATE 100 MG PO CAPS: 100.0000 mg | ORAL_CAPSULE | Freq: Two times a day (BID) | ORAL | Status: DC

## 2014-01-28 NOTE — Progress Notes (Signed)
   Subjective:    Patient ID: Lance [name], male    DOB: [birth year], 68 y.o.   MRN: [id number]  Sinus Problem The current episode started in the past 7 days. The problem has been gradually worsening since onset. Maximum temperature: Temperature:98.7. Associated symptoms include congestion, coughing and headaches. Pertinent negatives include no ear pain or neck pain. (Symptoms also include body aches & wheezing.) Past treatments include nothing.  Sore Throat  Associated symptoms include congestion, coughing and headaches. Pertinent negatives include no ear pain or neck pain.   PMH HTN heart disease   Review of Systems  Constitutional: Negative for fever and activity change.  HENT: Positive for congestion and rhinorrhea. Negative for ear pain.   Eyes: Negative for discharge.  Respiratory: Positive for cough. Negative for wheezing.   Cardiovascular: Negative for chest pain.  Musculoskeletal: Negative for neck pain.  Neurological: Positive for headaches.       Objective:   Physical Exam  Nursing note and vitals reviewed. Constitutional: He appears well-developed.  HENT:  Head: Normocephalic.  Mouth/Throat: Oropharynx is clear and moist. No oropharyngeal exudate.  Neck: Normal range of motion.  Cardiovascular: Normal rate, regular rhythm and normal heart sounds.   No murmur heard. Pulmonary/Chest: Effort normal and breath sounds normal. He has no wheezes.  Lymphadenopathy:    He has no cervical adenopathy.  Neurological: He exhibits normal muscle tone.  Skin: Skin is warm and dry.          Assessment & Plan:  Viral syndrome no sign of a bacterial infection antibiotics was prescribed if he gets worse he may get it filled and the B-cell otherwise. Warning signs discussed.

## 2014-02-01 DIAGNOSIS — E119 Type 2 diabetes mellitus without complications: Secondary | ICD-10-CM | POA: Diagnosis not present

## 2014-02-01 DIAGNOSIS — E785 Hyperlipidemia, unspecified: Secondary | ICD-10-CM | POA: Diagnosis not present

## 2014-02-01 DIAGNOSIS — I10 Essential (primary) hypertension: Secondary | ICD-10-CM | POA: Diagnosis not present

## 2014-02-08 DIAGNOSIS — I10 Essential (primary) hypertension: Secondary | ICD-10-CM | POA: Diagnosis not present

## 2014-02-08 DIAGNOSIS — E1165 Type 2 diabetes mellitus with hyperglycemia: Secondary | ICD-10-CM | POA: Diagnosis not present

## 2014-02-08 DIAGNOSIS — E782 Mixed hyperlipidemia: Secondary | ICD-10-CM | POA: Diagnosis not present

## 2014-02-12 ENCOUNTER — Other Ambulatory Visit: Payer: Self-pay | Admitting: *Deleted

## 2014-02-12 ENCOUNTER — Telehealth: Payer: Self-pay | Admitting: *Deleted

## 2014-02-12 MED ORDER — CLOPIDOGREL BISULFATE 75 MG PO TABS: 75.0000 mg | ORAL_TABLET | Freq: Every day | ORAL | Status: DC

## 2014-02-12 NOTE — Telephone Encounter (Signed)
Refills sent to [facility].

## 2014-02-12 NOTE — Telephone Encounter (Signed)
rx request from [facility] for plavix 75 mg. orignally prescribed by Dr. Mendel Corning. Last seen 01/28/14

## 2014-02-12 NOTE — Telephone Encounter (Signed)
May have 10 refills

## 2014-02-28 DIAGNOSIS — Z23 Encounter for immunization: Secondary | ICD-10-CM | POA: Diagnosis not present

## 2014-03-01 DIAGNOSIS — B351 Tinea unguium: Secondary | ICD-10-CM | POA: Diagnosis not present

## 2014-03-01 DIAGNOSIS — E1142 Type 2 diabetes mellitus with diabetic polyneuropathy: Secondary | ICD-10-CM | POA: Diagnosis not present

## 2014-03-07 ENCOUNTER — Other Ambulatory Visit: Payer: Self-pay | Admitting: Neurology

## 2014-03-08 ENCOUNTER — Ambulatory Visit (INDEPENDENT_AMBULATORY_CARE_PROVIDER_SITE_OTHER): Payer: Medicare Other | Admitting: Adult Health

## 2014-03-08 ENCOUNTER — Encounter: Payer: Self-pay | Admitting: Adult Health

## 2014-03-08 VITALS — BP 137/83 | HR 63 | Ht 69.0 in | Wt 225.8 lb

## 2014-03-08 DIAGNOSIS — R413 Other amnesia: Secondary | ICD-10-CM

## 2014-03-08 DIAGNOSIS — I2510 Atherosclerotic heart disease of native coronary artery without angina pectoris: Secondary | ICD-10-CM

## 2014-03-08 DIAGNOSIS — R42 Dizziness and giddiness: Secondary | ICD-10-CM | POA: Diagnosis not present

## 2014-03-08 NOTE — Patient Instructions (Signed)
Dementia Dementia is a general term for problems with brain function. A person with dementia has memory loss and a hard time with at least one other brain function such as thinking, speaking, or problem solving. Dementia can affect social functioning, how you do your job, your mood, or your personality. The changes may be hidden for a long time. The earliest forms of this disease are usually not detected by family or friends. Dementia can be:  Irreversible.  Potentially reversible.  Partially reversible.  Progressive. This means it can get worse over time. CAUSES  Irreversible dementia causes may include:  Degeneration of brain cells (Alzheimer disease or Lewy body dementia).  Multiple small strokes (vascular dementia).  Infection (chronic meningitis or Creutzfeldt-Jakob disease).  Frontotemporal dementia. This affects younger people, age 40 to 70, compared to those who have Alzheimer disease.  Dementia associated with other disorders like Parkinson disease, Huntington disease, or HIV-associated dementia. Potentially or partially reversible dementia causes may include:  Medicines.  Metabolic causes such as excessive alcohol intake, vitamin B12 deficiency, or thyroid disease.  Masses or pressure in the brain such as a tumor, blood clot, or hydrocephalus. SIGNS AND SYMPTOMS  Symptoms are often hard to detect. Family members or coworkers may not notice them early in the disease process. Different people with dementia may have different symptoms. Symptoms can include:  A hard time with memory, especially recent memory. Long-term memory may not be impaired.  Asking the same question multiple times or forgetting something someone just said.  A hard time speaking your thoughts or finding certain words.  A hard time solving problems or performing familiar tasks (such as how to use a telephone).  Sudden changes in mood.  Changes in personality, especially increasing moodiness or  mistrust.  Depression.  A hard time understanding complex ideas that were never a problem in the past. DIAGNOSIS  There are no specific tests for dementia.   Your health care provider may recommend a thorough evaluation. This is because some forms of dementia can be reversible. The evaluation will likely include a physical exam and getting a detailed history from you and a family member. The history often gives the best clues and suggestions for a diagnosis.  Memory testing may be done. A detailed brain function evaluation called neuropsychologic testing may be helpful.  Lab tests and brain imaging (such as a CT scan or MRI scan) are sometimes important.  Sometimes observation and re-evaluation over time is very helpful. TREATMENT  Treatment depends on the cause.   If the problem is a vitamin deficiency, it may be helped or cured with supplements.  For dementias such as Alzheimer disease, medicines are available to stabilize or slow the course of the disease. There are no cures for this type of dementia.  Your health care provider can help direct you to groups, organizations, and other health care providers to help with decisions in the care of you or your loved one. HOME CARE INSTRUCTIONS The care of individuals with dementia is varied and dependent upon the progression of the dementia. The following suggestions are intended for the person living with, or caring for, the person with dementia.  Create a safe environment.  Remove the locks on bathroom doors to prevent the person from accidentally locking himself or herself in.  Use childproof latches on kitchen cabinets and any place where cleaning supplies, chemicals, or alcohol are kept.  Use childproof covers in unused electrical outlets.  Install childproof devices to keep doors and windows   secured.  Remove stove knobs or install safety knobs and an automatic shut-off on the stove.  Lower the temperature on water  heaters.  Label medicines and keep them locked up.  Secure knives, lighters, matches, power tools, and guns, and keep these items out of reach.  Keep the house free from clutter. Remove rugs or anything that might contribute to a fall.  Remove objects that might break and hurt the person.  Make sure lighting is good, both inside and outside.  Install grab rails as needed.  Use a monitoring device to alert you to falls or other needs for help.  Reduce confusion.  Keep familiar objects and people around.  Use night lights or dim lights at night.  Label items or areas.  Use reminders, notes, or directions for daily activities or tasks.  Keep a simple, consistent routine for waking, meals, bathing, dressing, and bedtime.  Create a calm, quiet environment.  Place large clocks and calendars prominently.  Display emergency numbers and home address near all telephones.  Use cues to establish different times of the day. An example is to open curtains to let the natural light in during the day.   Use effective communication.  Choose simple words and short sentences.  Use a gentle, calm tone of voice.  Be careful not to interrupt.  If the person is struggling to find a word or communicate a thought, try to provide the word or thought.  Ask one question at a time. Allow the person ample time to answer questions. Repeat the question again if the person does not respond.  Reduce nighttime restlessness.  Provide a comfortable bed.  Have a consistent nighttime routine.  Ensure a regular walking or physical activity schedule. Involve the person in daily activities as much as possible.  Limit napping during the day.  Limit caffeine.  Attend social events that stimulate rather than overwhelm the senses.  Encourage good nutrition and hydration.  Reduce distractions during meal times and snacks.  Avoid foods that are too hot or too cold.  Monitor chewing and swallowing  ability.  Continue with routine vision, hearing, dental, and medical screenings.  Give medicines only as directed by the health care provider.  Monitor driving abilities. Do not allow the person to drive when safe driving is no longer possible.  Register with an identification program which could provide location assistance in the event of a missing person situation. SEEK MEDICAL CARE IF:   New behavioral problems start such as moodiness, aggressiveness, or seeing things that are not there (hallucinations).  Any new problem with brain function happens. This includes problems with balance, speech, or falling a lot.  Problems with swallowing develop.  Any symptoms of other illness happen. Small changes or worsening in any aspect of brain function can be a sign that the illness is getting worse. It can also be a sign of another medical illness such as infection. Seeing a health care provider right away is important. SEEK IMMEDIATE MEDICAL CARE IF:   A fever develops.  New or worsened confusion develops.  New or worsened sleepiness develops.  Staying awake becomes hard to do. Document Released: 10/13/2000 Document Revised: 09/03/2013 Document Reviewed: 09/14/2010 Exit[employer] Patient Information 2015 [employer]. This information is not intended to replace advice given to you by your health care provider. Make sure you discuss any questions you have with your health care provider.  
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
## 2014-03-08 NOTE — Progress Notes (Signed)
I have read the note, and I agree with the clinical assessment and plan.  Lance [name],Lance [name]   

## 2014-03-14 ENCOUNTER — Other Ambulatory Visit: Payer: Self-pay | Admitting: Family Medicine

## 2014-03-14 NOTE — Telephone Encounter (Signed)
It doesn't seem like this patient has been seen here for diabetes? Does he see a specialist?

## 2014-03-15 ENCOUNTER — Ambulatory Visit (INDEPENDENT_AMBULATORY_CARE_PROVIDER_SITE_OTHER): Payer: Medicare Other | Admitting: Family Medicine

## 2014-03-15 ENCOUNTER — Ambulatory Visit (HOSPITAL_COMMUNITY)
Admission: RE | Admit: 2014-03-15 | Discharge: 2014-03-15 | Disposition: A | Discharge status: Home / Self Care | Payer: Medicare Other | Source: Ambulatory Visit | Attending: Family Medicine | Admitting: Family Medicine

## 2014-03-15 ENCOUNTER — Encounter: Payer: Self-pay | Admitting: Family Medicine

## 2014-03-15 VITALS — BP 124/70 | Temp 99.1°F | Ht 69.0 in | Wt 222.0 lb

## 2014-03-15 DIAGNOSIS — R0602 Shortness of breath: Secondary | ICD-10-CM

## 2014-03-15 DIAGNOSIS — I2510 Atherosclerotic heart disease of native coronary artery without angina pectoris: Secondary | ICD-10-CM | POA: Diagnosis not present

## 2014-03-15 LAB — D-DIMER, QUANTITATIVE: D-Dimer, Quant: 0.38 ug/mL-FEU (ref 0.00–0.48)

## 2014-03-15 MED ORDER — ALBUTEROL SULFATE HFA 108 (90 BASE) MCG/ACT IN AERS: 2.0000 | INHALATION_SPRAY | Freq: Four times a day (QID) | RESPIRATORY_TRACT | Status: DC | PRN

## 2014-03-15 NOTE — Progress Notes (Signed)
   Subjective:    Patient ID: Lance [name], male    DOB: 13-Apr-1946, 68 y.o.   MRN: [id number]  Shortness of Breath This is a new problem. The current episode started 1 to 4 weeks ago. The problem occurs intermittently. The problem has been gradually worsening. Associated symptoms include sputum production. Nothing aggravates the symptoms. Associated symptoms comments: cough. The patient has no known risk factors for DVT/PE. He has tried nothing for the symptoms. The treatment provided no relief.   Patient states that he has no other concerns at this time.     Review of Systems  Respiratory: Positive for sputum production and shortness of breath.    He denies hemoptysis. Sputum is been mainly whitish production.    Objective:   Physical Exam  Lungs clear heart regular pulse normal extremities no edema skin warm dry      Assessment & Plan:  dyspnea-do numerous testing to look at possibilities rule out common exam list probable COPD. Start albuterol but may need other inhalers possibly pulmonary consultation await all the testing.

## 2014-03-15 NOTE — Patient Instructions (Signed)
How to Use an Inhaler Proper inhaler technique is very important. Good technique ensures that the medicine reaches the lungs. Poor technique results in depositing the medicine on the tongue and back of the throat rather than in the airways. If you do not use the inhaler with good technique, the medicine will not help you. STEPS TO FOLLOW IF USING AN INHALER WITHOUT AN EXTENSION TUBE 1. Remove the cap from the inhaler. 2. If you are using the inhaler for the first time, you will need to prime it. Shake the inhaler for 5 seconds and release four puffs into the air, away from your face. Ask your health care provider or pharmacist if you have questions about priming your inhaler. 3. Shake the inhaler for 5 seconds before each breath in (inhalation). 4. Position the inhaler so that the top of the canister faces up. 5. Put your index finger on the top of the medicine canister. Your thumb supports the bottom of the inhaler. 6. Open your mouth. 7. Either place the inhaler between your teeth and place your lips tightly around the mouthpiece, or hold the inhaler 1-2 inches away from your open mouth. If you are unsure of which technique to use, ask your health care provider. 8. Breathe out (exhale) normally and as completely as possible. 9. Press the canister down with your index finger to release the medicine. 10. At the same time as the canister is pressed, inhale deeply and slowly until your lungs are completely filled. This should take 4-6 seconds. Keep your tongue down. 11. Hold the medicine in your lungs for 5-10 seconds (10 seconds is best). This helps the medicine get into the small airways of your lungs. 12. Breathe out slowly, through pursed lips. Whistling is an example of pursed lips. 13. Wait at least 15-30 seconds between puffs. Continue with the above steps until you have taken the number of puffs your health care provider has ordered. Do not use the inhaler more than your health care provider  tells you. 14. Replace the cap on the inhaler. 15. Follow the directions from your health care provider or the inhaler insert for cleaning the inhaler. STEPS TO FOLLOW IF USING AN INHALER WITH AN EXTENSION (SPACER) 1. Remove the cap from the inhaler. 2. If you are using the inhaler for the first time, you will need to prime it. Shake the inhaler for 5 seconds and release four puffs into the air, away from your face. Ask your health care provider or pharmacist if you have questions about priming your inhaler. 3. Shake the inhaler for 5 seconds before each breath in (inhalation). 4. Place the open end of the spacer onto the mouthpiece of the inhaler. 5. Position the inhaler so that the top of the canister faces up and the spacer mouthpiece faces you. 6. Put your index finger on the top of the medicine canister. Your thumb supports the bottom of the inhaler and the spacer. 7. Breathe out (exhale) normally and as completely as possible. 8. Immediately after exhaling, place the spacer between your teeth and into your mouth. Close your lips tightly around the spacer. 9. Press the canister down with your index finger to release the medicine. 10. At the same time as the canister is pressed, inhale deeply and slowly until your lungs are completely filled. This should take 4-6 seconds. Keep your tongue down and out of the way. 11. Hold the medicine in your lungs for 5-10 seconds (10 seconds is best). This helps the   medicine get into the small airways of your lungs. Exhale. 12. Repeat inhaling deeply through the spacer mouthpiece. Again hold that breath for up to 10 seconds (10 seconds is best). Exhale slowly. If it is difficult to take this second deep breath through the spacer, breathe normally several times through the spacer. Remove the spacer from your mouth. 13. Wait at least 15-30 seconds between puffs. Continue with the above steps until you have taken the number of puffs your health care provider has  ordered. Do not use the inhaler more than your health care provider tells you. 14. Remove the spacer from the inhaler, and place the cap on the inhaler. 15. Follow the directions from your health care provider or the inhaler insert for cleaning the inhaler and spacer. If you are using different kinds of inhalers, use your quick relief medicine to open the airways 10-15 minutes before using a steroid if instructed to do so by your health care provider. If you are unsure which inhalers to use and the order of using them, ask your health care provider, nurse, or respiratory therapist. If you are using a steroid inhaler, always rinse your mouth with water after your last puff, then gargle and spit out the water. Do not swallow the water. AVOID:  Inhaling before or after starting the spray of medicine. It takes practice to coordinate your breathing with triggering the spray.  Inhaling through the nose (rather than the mouth) when triggering the spray. HOW TO DETERMINE IF YOUR INHALER IS FULL OR NEARLY EMPTY You cannot know when an inhaler is empty by shaking it. A few inhalers are now being made with dose counters. Ask your health care provider for a prescription that has a dose counter if you feel you need that extra help. If your inhaler does not have a counter, ask your health care provider to help you determine the date you need to refill your inhaler. Write the refill date on a calendar or your inhaler canister. Refill your inhaler 7-10 days before it runs out. Be sure to keep an adequate supply of medicine. This includes making sure it is not expired, and that you have a spare inhaler.  SEEK MEDICAL CARE IF:   Your symptoms are only partially relieved with your inhaler.  You are having trouble using your inhaler.  You have some increase in phlegm. SEEK IMMEDIATE MEDICAL CARE IF:   You feel little or no relief with your inhalers. You are still wheezing and are feeling shortness of breath or  tightness in your chest or both.  You have dizziness, headaches, or a fast heart rate.  You have chills, fever, or night sweats.  You have a noticeable increase in phlegm production, or there is blood in the phlegm. MAKE SURE YOU:   Understand these instructions.  Will watch your condition.  Will get help right away if you are not doing well or get worse. Document Released: 04/16/2000 Document Revised: 02/07/2013 Document Reviewed: 11/16/2012 [employer] Patient Information 2015 [employer]. This information is not intended to replace advice given to you by your health care provider. Make sure you discuss any questions you have with your health care provider.  

## 2014-03-21 ENCOUNTER — Ambulatory Visit (HOSPITAL_COMMUNITY)
Admission: RE | Admit: 2014-03-21 | Discharge: 2014-03-21 | Disposition: A | Discharge status: Home / Self Care | Payer: Medicare Other | Source: Ambulatory Visit | Attending: Family Medicine | Admitting: Family Medicine

## 2014-03-21 ENCOUNTER — Other Ambulatory Visit: Payer: Self-pay | Admitting: Family Medicine

## 2014-03-21 DIAGNOSIS — M4722 Other spondylosis with radiculopathy, cervical region: Secondary | ICD-10-CM | POA: Diagnosis not present

## 2014-03-21 DIAGNOSIS — G894 Chronic pain syndrome: Secondary | ICD-10-CM | POA: Diagnosis not present

## 2014-03-21 DIAGNOSIS — R0602 Shortness of breath: Secondary | ICD-10-CM | POA: Insufficient documentation

## 2014-03-21 DIAGNOSIS — M5412 Radiculopathy, cervical region: Secondary | ICD-10-CM | POA: Diagnosis not present

## 2014-03-21 LAB — PULMONARY FUNCTION TEST
DL/VA % pred: 115 %
DL/VA: 5.27 ml/min/mmHg/L
DLCO cor % pred: 73 %
DLCO cor: 22.89 ml/min/mmHg
DLCO unc % pred: 73 %
DLCO unc: 22.89 ml/min/mmHg
FEF 25-75 Post: 2.23 L/sec
FEF 25-75 Pre: 1.21 L/sec
FEF2575-%Change-Post: 83 %
FEF2575-%Pred-Post: 86 %
FEF2575-%Pred-Pre: 47 %
FEV1-%Change-Post: -8 %
FEV1-%Pred-Post: 79 %
FEV1-%Pred-Pre: 86 %
FEV1-Post: 2.59 L
FEV1-Pre: 2.82 L
FEV1FVC-%Change-Post: 3 %
FEV1FVC-%Pred-Pre: 112 %
FEV6-%Change-Post: -12 %
FEV6-%Pred-Post: 71 %
FEV6-%Pred-Pre: 81 %
FEV6-Post: 2.97 L
FEV6-Pre: 3.39 L
FEV6FVC-%Change-Post: 0 %
FEV6FVC-%Pred-Post: 105 %
FEV6FVC-%Pred-Pre: 105 %
FVC-%Change-Post: -11 %
FVC-%Pred-Post: 68 %
FVC-%Pred-Pre: 77 %
FVC-Post: 3.02 L
FVC-Pre: 3.4 L
Post FEV1/FVC ratio: 86 %
Post FEV6/FVC ratio: 100 %
Pre FEV1/FVC ratio: 83 %
Pre FEV6/FVC Ratio: 100 %
RV % pred: 96 %
RV: 2.24 L
TLC % pred: 79 %
TLC: 5.39 L

## 2014-03-21 MED ORDER — ALBUTEROL SULFATE (2.5 MG/3ML) 0.083% IN NEBU
2.5000 mg | INHALATION_SOLUTION | Freq: Once | RESPIRATORY_TRACT | Status: AC
  Administered 2014-03-21: 2.5 mg via RESPIRATORY_TRACT

## 2014-03-22 ENCOUNTER — Encounter: Payer: Self-pay | Admitting: *Deleted

## 2014-03-22 NOTE — Telephone Encounter (Signed)
Last seen 03/15/14

## 2014-03-26 ENCOUNTER — Other Ambulatory Visit: Payer: Self-pay | Admitting: Family Medicine

## 2014-03-26 DIAGNOSIS — E11359 Type 2 diabetes mellitus with proliferative diabetic retinopathy without macular edema: Secondary | ICD-10-CM | POA: Diagnosis not present

## 2014-04-02 ENCOUNTER — Other Ambulatory Visit: Payer: Self-pay | Admitting: Family Medicine

## 2014-04-02 DIAGNOSIS — R0600 Dyspnea, unspecified: Secondary | ICD-10-CM

## 2014-04-09 ENCOUNTER — Other Ambulatory Visit: Payer: Self-pay | Admitting: Neurology

## 2014-04-09 ENCOUNTER — Other Ambulatory Visit: Payer: Self-pay | Admitting: Family Medicine

## 2014-04-10 ENCOUNTER — Other Ambulatory Visit: Payer: Self-pay

## 2014-04-10 MED ORDER — DEXLANSOPRAZOLE 60 MG PO CPDR: 60.0000 mg | DELAYED_RELEASE_CAPSULE | Freq: Every day | ORAL | Status: DC

## 2014-04-11 ENCOUNTER — Encounter (HOSPITAL_COMMUNITY): Payer: Self-pay | Admitting: Internal Medicine

## 2014-04-14 ENCOUNTER — Other Ambulatory Visit: Payer: Self-pay | Admitting: Family Medicine

## 2014-04-15 NOTE — Telephone Encounter (Signed)
Last seen 03/15/14 (sick)

## 2014-04-17 ENCOUNTER — Encounter: Payer: Self-pay | Admitting: Family Medicine

## 2014-05-07 ENCOUNTER — Other Ambulatory Visit: Payer: Self-pay | Admitting: Family Medicine

## 2014-05-07 DIAGNOSIS — Z713 Dietary counseling and surveillance: Secondary | ICD-10-CM | POA: Diagnosis not present

## 2014-05-07 DIAGNOSIS — E782 Mixed hyperlipidemia: Secondary | ICD-10-CM | POA: Diagnosis not present

## 2014-05-07 DIAGNOSIS — E1165 Type 2 diabetes mellitus with hyperglycemia: Secondary | ICD-10-CM | POA: Diagnosis not present

## 2014-05-07 DIAGNOSIS — E559 Vitamin D deficiency, unspecified: Secondary | ICD-10-CM | POA: Diagnosis not present

## 2014-05-07 DIAGNOSIS — I10 Essential (primary) hypertension: Secondary | ICD-10-CM | POA: Diagnosis not present

## 2014-05-08 NOTE — Telephone Encounter (Signed)
If he is being followed by Dr.Nida for his diabetes this refill needs to be through their office. You will probably need to find out from the patient.

## 2014-05-08 NOTE — Telephone Encounter (Signed)
Patient sees a diabetic specialist.

## 2014-05-09 NOTE — Telephone Encounter (Signed)
Make sure pt and or wife or pharmacy knows to send this to the diabetic specialist, Thanks

## 2014-05-13 ENCOUNTER — Encounter: Payer: Self-pay | Admitting: Pulmonary Disease

## 2014-05-13 ENCOUNTER — Ambulatory Visit (INDEPENDENT_AMBULATORY_CARE_PROVIDER_SITE_OTHER): Payer: Medicare Other | Admitting: Family Medicine

## 2014-05-13 ENCOUNTER — Encounter: Payer: Self-pay | Admitting: Family Medicine

## 2014-05-13 ENCOUNTER — Ambulatory Visit (INDEPENDENT_AMBULATORY_CARE_PROVIDER_SITE_OTHER): Payer: Medicare Other | Admitting: Pulmonary Disease

## 2014-05-13 VITALS — BP 132/88 | Temp 98.6°F | Ht 69.0 in | Wt 230.0 lb

## 2014-05-13 VITALS — BP 118/64 | HR 64 | Temp 97.2°F | Ht 69.0 in | Wt 227.4 lb

## 2014-05-13 DIAGNOSIS — J329 Chronic sinusitis, unspecified: Secondary | ICD-10-CM

## 2014-05-13 DIAGNOSIS — R0609 Other forms of dyspnea: Secondary | ICD-10-CM | POA: Diagnosis not present

## 2014-05-13 DIAGNOSIS — R0600 Dyspnea, unspecified: Secondary | ICD-10-CM

## 2014-05-13 MED ORDER — DOXYCYCLINE HYCLATE 100 MG PO CAPS: 100.0000 mg | ORAL_CAPSULE | Freq: Two times a day (BID) | ORAL | Status: DC

## 2014-05-13 MED ORDER — HYDROCODONE-HOMATROPINE 5-1.5 MG/5ML PO SYRP: 5.0000 mL | ORAL_SOLUTION | Freq: Three times a day (TID) | ORAL | Status: DC | PRN

## 2014-05-13 NOTE — Progress Notes (Signed)
   Subjective:    Patient ID: Lance [name], male    DOB: 10-19-1945, 69 y.o.   MRN: [id number]  HPI The patient is a 69 year old male who I've been asked to see for evaluation of dyspnea on exertion. He has a history of exertional dyspnea for years, but feels it is been getting significantly worse over the last 6-8 months. The patient has a history of a cardiomyopathy, and has an ICD/pacer in place. He has had a recent echo last year which showed an ejection fraction of 45%, moderate reduction in his right ventricular function, moderate left atrial enlargement, and normal pulmonary pressures. He has had a recent chest x-ray that is totally clear. He had pulmonary function studies in 2013 that showed no significant airflow obstruction, and his studies today are very similar. He does have minimal restriction based on his total lung capacity, and a very mild reduction in his diffusion capacity that corrects to normal with alveolar volume adjustment. His flow volume loop does not show any airflow obstruction. He has a long history of smoking, but quit in 2000. Currently he describes dyspnea with one flight of stairs, or bringing groceries in from the car, and less than one block at a moderate pace. He can also get winded with long conversation. He has a mild to moderate cough with clear mucus production, but also has a lot of sinus issues and postnasal drip. He denies any issues with lower extremity edema, and feels that his weight is neutral over the last one to 2 years. He also describes worsening in his breathing bending over and after eating.   Review of Systems  Constitutional: Negative for fever and unexpected weight change.  HENT: Positive for congestion, sneezing, sore throat and trouble swallowing. Negative for dental problem, ear pain, nosebleeds, postnasal drip, rhinorrhea and sinus pressure.   Eyes: Negative for redness and itching.  Respiratory: Positive for cough and shortness of breath.  Negative for chest tightness and wheezing.   Cardiovascular: Negative for palpitations and leg swelling.  Gastrointestinal: Positive for abdominal pain. Negative for nausea and vomiting.  Genitourinary: Negative for dysuria.  Musculoskeletal: Negative for joint swelling.  Skin: Negative for rash.  Neurological: Negative for headaches.  Hematological: Does not bruise/bleed easily.  Psychiatric/Behavioral: Negative for dysphoric mood. The patient is not nervous/anxious.        Objective:   Physical Exam Constitutional:  Obese male, no acute distress  HENT:  Nares patent without discharge  Oropharynx without exudate, palate and uvula are normal  Eyes:  Perrla, eomi, no scleral icterus  Neck:  No JVD, no TMG  Cardiovascular:  Normal rate, regular rhythm, no rubs or gallops.  No murmurs        Intact distal pulses  Pulmonary :  Normal breath sounds, no stridor or respiratory distress   No rales, rhonchi, or wheezing  Abdominal:  Soft, nondistended, bowel sounds present.  No tenderness noted.   Musculoskeletal:  No lower extremity edema noted.  Lymph Nodes:  No cervical lymphadenopathy noted  Skin:  No cyanosis noted  Neurologic:  Alert, appropriate, moves all 4 extremities without obvious deficit.         Assessment & Plan:

## 2014-05-13 NOTE — Patient Instructions (Signed)
I suspect that copd is not the cause of your breathing issues, but will try you on spiriva respimat 2 inhalations each am for next 4 weeks to see if this makes a difference. Work on getting your weight down, and some type of conditioning program. Would like to see you back in 4 weeks to see how things have gone.

## 2014-05-13 NOTE — Assessment & Plan Note (Signed)
The patient has worsening dyspnea on exertion over the last 6-8 months that is significantly impacting his quality of life. He does have a long history of smoking, but his pulmonary function studies are really unimpressive for any significant airflow obstruction.  He may have underlying emphysema anatomically, but his pulmonary function studies showed no significant airflow obstruction. He does have airway symptoms in the form of cough and mucus production, but also has a lot of sinus issues and postnasal drip. Therefore, this mucus may be coming from his upper airway.  The patient also has very significant underlying cardiac disease, and centripetal obesity. These can be significant contributors to his dyspnea. Finally, although his d-dimer is normal, this may not exclude possible chronic thromboembolic disease. At this point, I am willing to try him on a long-acting bronchodilator to see if he sees a significant difference. If he does not, I might consider doing a ventilation perfusion scan for chronic thromboembolic disease. He obviously needs to work on weight reduction and some type of conditioning program. If his breathing issue is not due to underlying lung disease, I wonder if there is anything else to be done from a cardiac standpoint.

## 2014-05-13 NOTE — Progress Notes (Signed)
   Subjective:    Patient ID: [name], male    DOB: [birth year], 69 y.o.   MRN: [id number]  Cough This is a new problem. Episode onset: The week of Christmas. The problem has been waxing and waning. The cough is productive of sputum. Associated symptoms include nasal congestion, postnasal drip, rhinorrhea and a sore throat. Associated symptoms comments: Burning eyes . Nothing aggravates the symptoms. He has tried OTC cough suppressant for the symptoms. The treatment provided mild relief.    Patient stated that start off viral illness week later was worse  Review of Systems  HENT: Positive for postnasal drip, rhinorrhea and sore throat.   Respiratory: Positive for cough.        Objective:   Physical Exam  Lungs are clear hearts regular moderate sinus tenderness eardrums normal neck supple      Assessment & Plan:  Sinusitis antibiotics prescribed warning signs discussed follow-up if ongoing troubles

## 2014-05-14 DIAGNOSIS — Z9119 Patient's noncompliance with other medical treatment and regimen: Secondary | ICD-10-CM | POA: Diagnosis not present

## 2014-05-14 DIAGNOSIS — E782 Mixed hyperlipidemia: Secondary | ICD-10-CM | POA: Diagnosis not present

## 2014-05-14 DIAGNOSIS — E559 Vitamin D deficiency, unspecified: Secondary | ICD-10-CM | POA: Diagnosis not present

## 2014-05-14 DIAGNOSIS — E1165 Type 2 diabetes mellitus with hyperglycemia: Secondary | ICD-10-CM | POA: Diagnosis not present

## 2014-05-14 DIAGNOSIS — I10 Essential (primary) hypertension: Secondary | ICD-10-CM | POA: Diagnosis not present

## 2014-05-20 ENCOUNTER — Other Ambulatory Visit: Payer: Self-pay | Admitting: Gastroenterology

## 2014-05-22 ENCOUNTER — Telehealth: Payer: Self-pay | Admitting: Family Medicine

## 2014-05-22 MED ORDER — SULFAMETHOXAZOLE-TRIMETHOPRIM 800-160 MG PO TABS: 1.0000 | ORAL_TABLET | Freq: Two times a day (BID) | ORAL | Status: DC

## 2014-05-22 NOTE — Telephone Encounter (Signed)
Patient's wife notified and verbalized understanding.  

## 2014-05-22 NOTE — Telephone Encounter (Signed)
Seen on 01/11.  Given doxycycline for 10 days.   No fever. No SOB. No wheezing.

## 2014-05-22 NOTE — Telephone Encounter (Signed)
Pt was told to call back if he was not better to get another round  Of anti biotic  Still coughing bad, ears   reids pharm

## 2014-05-22 NOTE — Telephone Encounter (Signed)
Bactrim DS one bid 10 days, follow up if ongoing

## 2014-05-27 ENCOUNTER — Telehealth: Payer: Self-pay | Admitting: *Deleted

## 2014-05-27 MED ORDER — NITROGLYCERIN 0.4 MG SL SUBL: 0.4000 mg | SUBLINGUAL_TABLET | SUBLINGUAL | Status: DC | PRN

## 2014-05-27 NOTE — Telephone Encounter (Signed)
Pt has been having sharpe pains under his arm for a couple time on and off

## 2014-05-27 NOTE — Telephone Encounter (Signed)
Pt has had left anterior chest wall pain for past month.Described as indigestion after eating chicken at the [facility].Last week he felt swollen inside and had a hard trouble standing.Denied dizziness or lightheadedness,drove self home.Today had same sx's.has not used any NTG,I explained to him that he needed to carry it and use it.he asked me to call in refills.He also reports SOB for 4-5 months and saw a pulmonologist in [place] and was told he has COPD and given inhaler.Pt was due for fu lat July 2015.  Apt made for day after tomorrow,Wed 1/27 at 1pm with Dr.Branch. Pt encouraged to go to ED if sx's worsen but he declined and said he would keep apt this week

## 2014-05-28 DIAGNOSIS — E1142 Type 2 diabetes mellitus with diabetic polyneuropathy: Secondary | ICD-10-CM | POA: Diagnosis not present

## 2014-05-28 DIAGNOSIS — B351 Tinea unguium: Secondary | ICD-10-CM | POA: Diagnosis not present

## 2014-05-29 ENCOUNTER — Encounter: Payer: Self-pay | Admitting: Cardiology

## 2014-05-29 ENCOUNTER — Ambulatory Visit (INDEPENDENT_AMBULATORY_CARE_PROVIDER_SITE_OTHER): Payer: Medicare Other | Admitting: Cardiology

## 2014-05-29 ENCOUNTER — Encounter: Payer: Self-pay | Admitting: *Deleted

## 2014-05-29 ENCOUNTER — Encounter (HOSPITAL_COMMUNITY): Payer: Self-pay | Admitting: Pharmacy Technician

## 2014-05-29 VITALS — BP 128/70 | HR 79 | Ht 69.0 in | Wt 228.2 lb

## 2014-05-29 DIAGNOSIS — I10 Essential (primary) hypertension: Secondary | ICD-10-CM

## 2014-05-29 DIAGNOSIS — I25709 Atherosclerosis of coronary artery bypass graft(s), unspecified, with unspecified angina pectoris: Secondary | ICD-10-CM

## 2014-05-29 DIAGNOSIS — R0789 Other chest pain: Secondary | ICD-10-CM

## 2014-05-29 DIAGNOSIS — Z01818 Encounter for other preprocedural examination: Secondary | ICD-10-CM | POA: Diagnosis not present

## 2014-05-29 MED ORDER — AMLODIPINE BESYLATE 10 MG PO TABS: 5.0000 mg | ORAL_TABLET | Freq: Every day | ORAL | Status: DC
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
## 2014-05-29 NOTE — Patient Instructions (Signed)
Your physician recommends that you schedule a follow-up appointment in: 1 month with Dr. Harl Bowie  Your physician has requested that you have a cardiac catheterization. Cardiac catheterization is used to diagnose and/or treat various heart conditions. Doctors may recommend this procedure for a number of different reasons. The most common reason is to evaluate chest pain. Chest pain can be a symptom of coronary artery disease (CAD), and cardiac catheterization can show whether plaque is narrowing or blocking your heart's arteries. This procedure is also used to evaluate the valves, as well as measure the blood flow and oxygen levels in different parts of your heart. For further information please visit [email or web address]. Please follow instruction sheet, as given.  Thank you for choosing [name]!

## 2014-05-30 ENCOUNTER — Ambulatory Visit (HOSPITAL_COMMUNITY)
Admission: RE | Admit: 2014-05-30 | Discharge: 2014-05-31 | Disposition: A | Discharge status: Home / Self Care | Payer: Medicare Other | Source: Ambulatory Visit | Attending: Cardiovascular Disease | Admitting: Cardiovascular Disease

## 2014-05-30 ENCOUNTER — Encounter (HOSPITAL_COMMUNITY): Payer: Self-pay | Admitting: *Deleted

## 2014-05-30 ENCOUNTER — Telehealth: Payer: Self-pay | Admitting: *Deleted

## 2014-05-30 ENCOUNTER — Encounter: Payer: Self-pay | Admitting: *Deleted

## 2014-05-30 ENCOUNTER — Encounter (HOSPITAL_COMMUNITY)
Admission: RE | Disposition: A | Discharge status: Home / Self Care | Payer: Medicare Other | Source: Ambulatory Visit | Attending: Cardiovascular Disease

## 2014-05-30 ENCOUNTER — Other Ambulatory Visit: Payer: Self-pay | Admitting: Cardiology

## 2014-05-30 DIAGNOSIS — E785 Hyperlipidemia, unspecified: Secondary | ICD-10-CM | POA: Diagnosis not present

## 2014-05-30 DIAGNOSIS — I200 Unstable angina: Secondary | ICD-10-CM | POA: Diagnosis present

## 2014-05-30 DIAGNOSIS — Z95810 Presence of automatic (implantable) cardiac defibrillator: Secondary | ICD-10-CM | POA: Insufficient documentation

## 2014-05-30 DIAGNOSIS — E1159 Type 2 diabetes mellitus with other circulatory complications: Secondary | ICD-10-CM

## 2014-05-30 DIAGNOSIS — J449 Chronic obstructive pulmonary disease, unspecified: Secondary | ICD-10-CM | POA: Insufficient documentation

## 2014-05-30 DIAGNOSIS — I255 Ischemic cardiomyopathy: Secondary | ICD-10-CM | POA: Diagnosis present

## 2014-05-30 DIAGNOSIS — G459 Transient cerebral ischemic attack, unspecified: Secondary | ICD-10-CM | POA: Diagnosis present

## 2014-05-30 DIAGNOSIS — E119 Type 2 diabetes mellitus without complications: Secondary | ICD-10-CM | POA: Diagnosis not present

## 2014-05-30 DIAGNOSIS — I25110 Atherosclerotic heart disease of native coronary artery with unstable angina pectoris: Secondary | ICD-10-CM | POA: Diagnosis not present

## 2014-05-30 DIAGNOSIS — I25810 Atherosclerosis of coronary artery bypass graft(s) without angina pectoris: Secondary | ICD-10-CM | POA: Insufficient documentation

## 2014-05-30 DIAGNOSIS — I10 Essential (primary) hypertension: Secondary | ICD-10-CM | POA: Diagnosis present

## 2014-05-30 DIAGNOSIS — Z794 Long term (current) use of insulin: Secondary | ICD-10-CM | POA: Diagnosis not present

## 2014-05-30 DIAGNOSIS — Z8673 Personal history of transient ischemic attack (TIA), and cerebral infarction without residual deficits: Secondary | ICD-10-CM | POA: Insufficient documentation

## 2014-05-30 DIAGNOSIS — Z951 Presence of aortocoronary bypass graft: Secondary | ICD-10-CM

## 2014-05-30 DIAGNOSIS — I2510 Atherosclerotic heart disease of native coronary artery without angina pectoris: Secondary | ICD-10-CM | POA: Diagnosis present

## 2014-05-30 DIAGNOSIS — Z955 Presence of coronary angioplasty implant and graft: Secondary | ICD-10-CM

## 2014-05-30 HISTORY — PX: LEFT HEART CATHETERIZATION WITH CORONARY/GRAFT ANGIOGRAM: SHX5450

## 2014-05-30 LAB — CBC WITH DIFFERENTIAL/PLATELET
Basophils Absolute: 0 10*3/uL (ref 0.0–0.1)
Basophils Relative: 0 % (ref 0–1)
Eosinophils Absolute: 0.1 10*3/uL (ref 0.0–0.7)
Eosinophils Relative: 2 % (ref 0–5)
HCT: 37.4 % — ABNORMAL LOW (ref 39.0–52.0)
Hemoglobin: 12.2 g/dL — ABNORMAL LOW (ref 13.0–17.0)
Lymphocytes Relative: 32 % (ref 12–46)
Lymphs Abs: 1.7 10*3/uL (ref 0.7–4.0)
MCH: 28 pg (ref 26.0–34.0)
MCHC: 32.6 g/dL (ref 30.0–36.0)
MCV: 85.8 fL (ref 78.0–100.0)
MPV: 12.6 fL — ABNORMAL HIGH (ref 8.6–12.4)
Monocytes Absolute: 0.5 10*3/uL (ref 0.1–1.0)
Monocytes Relative: 9 % (ref 3–12)
Neutro Abs: 3.1 10*3/uL (ref 1.7–7.7)
Neutrophils Relative %: 57 % (ref 43–77)
Platelets: 134 10*3/uL — ABNORMAL LOW (ref 150–400)
RBC: 4.36 MIL/uL (ref 4.22–5.81)
RDW: 14.6 % (ref 11.5–15.5)
WBC: 5.4 10*3/uL (ref 4.0–10.5)

## 2014-05-30 LAB — GLUCOSE, CAPILLARY
Glucose-Capillary: 311 mg/dL — ABNORMAL HIGH (ref 70–99)
Glucose-Capillary: 133 mg/dL — ABNORMAL HIGH (ref 70–99)
Glucose-Capillary: 198 mg/dL — ABNORMAL HIGH (ref 70–99)
Glucose-Capillary: 217 mg/dL — ABNORMAL HIGH (ref 70–99)

## 2014-05-30 LAB — POCT ACTIVATED CLOTTING TIME
Activated Clotting Time: 171 seconds
Activated Clotting Time: 214 seconds
Activated Clotting Time: 724 seconds

## 2014-05-30 LAB — BASIC METABOLIC PANEL
BUN: 11 mg/dL (ref 6–23)
CO2: 25 mEq/L (ref 19–32)
Calcium: 8.9 mg/dL (ref 8.4–10.5)
Chloride: 102 mEq/L (ref 96–112)
Creat: 1.25 mg/dL (ref 0.50–1.35)
Glucose, Bld: 224 mg/dL — ABNORMAL HIGH (ref 70–99)
Potassium: 4 mEq/L (ref 3.5–5.3)
Sodium: 137 mEq/L (ref 135–145)

## 2014-05-30 LAB — PROTIME-INR
INR: 1.13 (ref <1.50)
Prothrombin Time: 14.5 seconds (ref 11.6–15.2)

## 2014-05-30 SURGERY — LEFT HEART CATHETERIZATION WITH CORONARY/GRAFT ANGIOGRAM
Anesthesia: LOCAL

## 2014-05-30 MED ORDER — TRAZODONE HCL 50 MG PO TABS
50.0000 mg | ORAL_TABLET | Freq: Every evening | ORAL | Status: DC | PRN
  Administered 2014-05-30: 22:00:00 50 mg via ORAL
  Filled 2014-05-30 (×2): qty 1

## 2014-05-30 MED ORDER — MIDAZOLAM HCL 2 MG/2ML IJ SOLN
INTRAMUSCULAR | Status: AC
  Filled 2014-05-30: qty 2

## 2014-05-30 MED ORDER — NITROGLYCERIN 1 MG/10 ML FOR IR/CATH LAB
INTRA_ARTERIAL | Status: AC
  Filled 2014-05-30: qty 10

## 2014-05-30 MED ORDER — LIDOCAINE HCL (PF) 1 % IJ SOLN
INTRAMUSCULAR | Status: AC
  Filled 2014-05-30: qty 30

## 2014-05-30 MED ORDER — BIVALIRUDIN 250 MG IV SOLR
INTRAVENOUS | Status: AC
  Filled 2014-05-30: qty 250

## 2014-05-30 MED ORDER — HEPARIN (PORCINE) IN NACL 2-0.9 UNIT/ML-% IJ SOLN
INTRAMUSCULAR | Status: AC
  Filled 2014-05-30: qty 1000

## 2014-05-30 MED ORDER — SODIUM CHLORIDE 0.9 % IJ SOLN: 3.0000 mL | Freq: Two times a day (BID) | INTRAMUSCULAR | Status: DC

## 2014-05-30 MED ORDER — METOPROLOL TARTRATE 50 MG PO TABS
50.0000 mg | ORAL_TABLET | Freq: Two times a day (BID) | ORAL | Status: DC
  Administered 2014-05-30 – 2014-05-31 (×2): 50 mg via ORAL
  Filled 2014-05-30 (×3): qty 1

## 2014-05-30 MED ORDER — DONEPEZIL HCL 10 MG PO TABS
10.0000 mg | ORAL_TABLET | Freq: Every day | ORAL | Status: DC
  Administered 2014-05-30: 20:00:00 10 mg via ORAL
  Filled 2014-05-30 (×2): qty 1

## 2014-05-30 MED ORDER — INSULIN GLARGINE 100 UNIT/ML ~~LOC~~ SOLN
56.0000 [IU] | Freq: Every day | SUBCUTANEOUS | Status: DC
  Administered 2014-05-30: 22:00:00 56 [IU] via SUBCUTANEOUS
  Filled 2014-05-30 (×2): qty 0.56

## 2014-05-30 MED ORDER — ASPIRIN 81 MG PO CHEW: 81.0000 mg | CHEWABLE_TABLET | ORAL | Status: DC

## 2014-05-30 MED ORDER — INSULIN ASPART 100 UNIT/ML ~~LOC~~ SOLN: 0.0000 [IU] | Freq: Three times a day (TID) | SUBCUTANEOUS | Status: DC

## 2014-05-30 MED ORDER — ATORVASTATIN CALCIUM 80 MG PO TABS
80.0000 mg | ORAL_TABLET | Freq: Every day | ORAL | Status: DC
  Filled 2014-05-30: qty 1

## 2014-05-30 MED ORDER — CLOPIDOGREL BISULFATE 300 MG PO TABS
ORAL_TABLET | ORAL | Status: AC
  Filled 2014-05-30: qty 1

## 2014-05-30 MED ORDER — GABAPENTIN 300 MG PO CAPS
300.0000 mg | ORAL_CAPSULE | Freq: Two times a day (BID) | ORAL | Status: DC
  Administered 2014-05-30 – 2014-05-31 (×2): 300 mg via ORAL
  Filled 2014-05-30 (×3): qty 1

## 2014-05-30 MED ORDER — ASPIRIN 81 MG PO CHEW
81.0000 mg | CHEWABLE_TABLET | Freq: Every day | ORAL | Status: DC
  Administered 2014-05-30 – 2014-05-31 (×2): 81 mg via ORAL
  Filled 2014-05-30 (×2): qty 1

## 2014-05-30 MED ORDER — SODIUM CHLORIDE 0.9 % IV SOLN: 250.0000 mL | INTRAVENOUS | Status: DC | PRN

## 2014-05-30 MED ORDER — ADENOSINE 12 MG/4ML IV SOLN
16.0000 mL | Freq: Once | INTRAVENOUS | Status: DC
  Filled 2014-05-30: qty 16

## 2014-05-30 MED ORDER — ALPRAZOLAM 0.5 MG PO TABS
0.5000 mg | ORAL_TABLET | Freq: Two times a day (BID) | ORAL | Status: DC | PRN
  Administered 2014-05-30: 0.5 mg via ORAL
  Filled 2014-05-30: qty 1

## 2014-05-30 MED ORDER — VITAMIN B-12 1000 MCG PO TABS
1000.0000 ug | ORAL_TABLET | Freq: Every day | ORAL | Status: DC
  Administered 2014-05-31: 09:00:00 1000 ug via ORAL
  Filled 2014-05-30: qty 1

## 2014-05-30 MED ORDER — ONDANSETRON HCL 4 MG/2ML IJ SOLN: 4.0000 mg | Freq: Four times a day (QID) | INTRAMUSCULAR | Status: DC | PRN

## 2014-05-30 MED ORDER — SODIUM CHLORIDE 0.9 % IV SOLN
INTRAVENOUS | Status: DC
  Administered 2014-05-30: 06:00:00 via INTRAVENOUS

## 2014-05-30 MED ORDER — AMLODIPINE BESYLATE 10 MG PO TABS: 10.0000 mg | ORAL_TABLET | Freq: Every day | ORAL | Status: DC

## 2014-05-30 MED ORDER — SODIUM CHLORIDE 0.9 % IV SOLN
INTRAVENOUS | Status: DC
  Administered 2014-05-30: 11:00:00 via INTRAVENOUS

## 2014-05-30 MED ORDER — HEPARIN SODIUM (PORCINE) 1000 UNIT/ML IJ SOLN
INTRAMUSCULAR | Status: AC
  Filled 2014-05-30: qty 1

## 2014-05-30 MED ORDER — NITROGLYCERIN 0.4 MG SL SUBL
0.4000 mg | SUBLINGUAL_TABLET | SUBLINGUAL | Status: DC | PRN
Start: 2014-05-30 — End: 2014-05-31

## 2014-05-30 MED ORDER — PANTOPRAZOLE SODIUM 40 MG PO TBEC
40.0000 mg | DELAYED_RELEASE_TABLET | Freq: Two times a day (BID) | ORAL | Status: DC
  Administered 2014-05-30 – 2014-05-31 (×2): 40 mg via ORAL
  Filled 2014-05-30 (×2): qty 1

## 2014-05-30 MED ORDER — TIOTROPIUM BROMIDE MONOHYDRATE 18 MCG IN CAPS
18.0000 ug | ORAL_CAPSULE | Freq: Every day | RESPIRATORY_TRACT | Status: DC
  Administered 2014-05-31: 08:00:00 18 ug via RESPIRATORY_TRACT
  Filled 2014-05-30: qty 5

## 2014-05-30 MED ORDER — HYDROCODONE-ACETAMINOPHEN 7.5-325 MG PO TABS
1.0000 | ORAL_TABLET | Freq: Four times a day (QID) | ORAL | Status: DC | PRN
  Administered 2014-05-30: 20:00:00 0.5 via ORAL
  Administered 2014-05-31: 09:00:00 1 via ORAL
  Filled 2014-05-30 (×2): qty 1

## 2014-05-30 MED ORDER — CLOPIDOGREL BISULFATE 75 MG PO TABS
75.0000 mg | ORAL_TABLET | Freq: Every day | ORAL | Status: DC
  Administered 2014-05-31: 09:00:00 75 mg via ORAL
  Filled 2014-05-30: qty 1

## 2014-05-30 MED ORDER — ACETAMINOPHEN 325 MG PO TABS: 650.0000 mg | ORAL_TABLET | ORAL | Status: DC | PRN

## 2014-05-30 MED ORDER — DULOXETINE HCL 30 MG PO CPEP
30.0000 mg | ORAL_CAPSULE | Freq: Every day | ORAL | Status: DC
  Administered 2014-05-31: 30 mg via ORAL
  Filled 2014-05-30: qty 1

## 2014-05-30 MED ORDER — AMITRIPTYLINE HCL 10 MG PO TABS
5.0000 mg | ORAL_TABLET | Freq: Every day | ORAL | Status: DC
  Administered 2014-05-30: 20:00:00 5 mg via ORAL
  Filled 2014-05-30 (×2): qty 0.5

## 2014-05-30 MED ORDER — SODIUM CHLORIDE 0.9 % IJ SOLN: 3.0000 mL | INTRAMUSCULAR | Status: DC | PRN

## 2014-05-30 MED ORDER — LOSARTAN POTASSIUM 50 MG PO TABS
50.0000 mg | ORAL_TABLET | Freq: Every day | ORAL | Status: DC
  Administered 2014-05-31: 09:00:00 50 mg via ORAL
  Filled 2014-05-30: qty 1

## 2014-05-30 MED ORDER — FENTANYL CITRATE 0.05 MG/ML IJ SOLN
INTRAMUSCULAR | Status: AC
  Filled 2014-05-30: qty 2

## 2014-05-30 NOTE — Progress Notes (Addendum)
Asked by nursing to order home meds. Ordered all except ASA/Plavix which were already on patient's MAR, and left off amlodipine in case there are plans to titrate ARB or BB in AM. Add SSI per hospital scale. Hang Ammon PA-C

## 2014-05-30 NOTE — Telephone Encounter (Signed)
Error

## 2014-05-30 NOTE — CV Procedure (Signed)
Lance [name] is a 69 y.o. male    [id number]  [id number] LOCATION:  FACILITY: [facility]  PHYSICIAN: Troy Sine, MD, [facility] Jan 11, 1946   DATE OF PROCEDURE:  05/30/2014    CARDIAC CATHETERIZATION/PERCUTANEOUES CORONARY INTERVENTION     HISTORY:    DAYSEN GUNDRUM is a 69 y.o. male who underwent CABG revascularization surgery x4 with a LIMA to LAD, SVG to diagonal, SVG to obtuse marginal, and SVG to PDA in September 2013.  He status post ICD implantation.  He had recently developed recurrent chest pain intermittently over the past month.  Recently had a severe episode of significant chest discomfort.  He was seen yesterday by Dr. Carlyle Dolly.  Due to worrisome symptomatology.  He is referred for definitive cardiac catheterization and possible percutaneous coronary intervention.   PROCEDURE: Left heart catheterization: coronary angiography in the native coronary arteries, selective angiography into 3 saphenous vein grafts and into the left internal mammary artery; left ventriculography; fractional flow reserve assessment of the saphenous vein graft supplying the PDA; PCI/DES stenting of the SVG to PDA  The patient was brought to the [facility] cardiac catherization laboratory in the fasting state. He was premedicated with Versed 2 mg and fentanyl 50 g. His right groin was prepped and shaved in usual sterile fashion. Xylocaine 1% was used for local anesthesia. A 5 French sheath was inserted into the R femoral artery. Diagnostic catheterizatiion was done with 5 [place] FL4, FR4, left bypass graft, right bypass graft, left internal mammary artery and pigtail catheters. Left ventriculography was done with 30 cc Omnipaque contrast.  The demonstration of a 80% focal stenosis in the proximal to midportion of the vein graft supplying the PDA, the decision was made to perform fractional flow reserve assessment to further evaluate its physiologic significance.  Patient received 7000 units of heparin.  A  5 French right bypass graft guide was inserted and a [place] fractional flow reserve wire was used for the procedure.  Adenosine was administered per protocol.  Normalization was documented in the very proximal portion of the graft.  Following adenosine infusion, the fractional flow reserve beyond the stenosis was 0.77, which was felt to be consistent with a significant flow altering stenosis.  The decision was then made to proceed with percutaneous coronary intervention.  The 5 French arterial sheath was upgraded to a 6 [place] sheath.  Angiomax bolus plus infusion was administered.  The patient had not received his morning dose of Plavix but had been on Plavix chronically.  He received an additional 300 mg oral load in the laboratory.  A 6 French right bypass graft guide was used for the procedure.  The wire was advanced into the PDA without difficulty.  Predilatation was done with a 2.512 mm Trek balloon at 8 and 10 atm.  A Xience Alpine 2.7518 mm DES stent was then deployed 2 at 11 atm.  An [place] Trek 3.012 mm balloon was used for post stent dilatation up to approximately 2.9 mm.  Angiography confirmed an excellent result.  Bivalirudin was discontinued at the end of the procedure.  The arterial sheath was sutured in place with plans for sheath removal later today.  He left the catheterization laboratory chest pain-free and with stable hemodynamics.   HEMODYNAMICS:   Central Aorta: 110/63   Left Ventricle: 110/12  ANGIOGRAPHY:  Fluoroscopy revealed calcification of coronary arteries.  Left main: The left main had ostial smooth taper of a proximally 30-40%.  LAD: Calcified proximally.  There was an  80% near ostial stenosis prior to the first septal perforating artery.  After the first diagonal branch.  There was a stent which ended proximal to the second septal perforating artery.  The LAD was occluded after the second septal perforating artery.  Left circumflex: Moderate size vessel that a total  occlusion of the first marginal branch which was bypassed.  Right coronary artery:  Calcified vessel with 50% smooth mid stenosis.  The PDA was occluded proximally.  The distal RCA and then inferior LV branch and posterolateral coronary artery.  LIMA to LAD: The takeoff from the subclavian.  The LIMA graft was patent and anastomosed into the mid LAD.  The  LAD beyond the LIMA graft anastomosis was free of significant disease and extended to the apex  SVG to OM1: Patent graft. The OM1 was free of significant disease beyond the graft anastomosis and gave rise to several branches beyond its anastomosis  SVG to diagonal vessel: Patent  SVG to PDA: There was a smooth eccentric 70-80% stenosis of the proximal third of the vessel.  The graft supplied a PDA vessel with supplied the posterior wall to the apex.  Left ventriculography revealed mild LV dilatation.  Ejection fraction was 35-40% with mild anterolateral hypocontractility.  Fractional flow reserve: Reduced to 0.77 beyond the stenosis in the SVG to PDA.  Following PCI to the SVG to the PDA , and ultimate insertion of a 2.7518 mm Xience Alpine DES stent postdilated to 2.9 mm, the 80% stenosis was reduced to 0%.  There was brisk TIMI-3 flow.  There is no evidence for dissection.    IMPRESSION:  Moderate LV dysfunction with an ejection fraction of 35-40% with residual anterolateral hypo-contractility.  Significant native coronary obstructive disease with very calcification and 30-40% smooth ostial left main stenosis; 80% ostial LAD stenosis with a patent proximal LAD stent stent, but with an occluded LAD after the second septal perforating artery; occluded OM1 vessel of the left circumflex artery; and RCA with 50% mid stenosis and occlusion of the proximal PDA.  Patent left internal artery graft supplying the mid LAD.  Patent saphenous vein graft supplying the OM1 vessel.  Patent saphenous vein graft supplying the diagonal  vessel.  Saphenous vein graft supplying the PDA with 80% eccentric stenosis.  Fractional flow reserve demonstrating significant physiologic lesion in the SVG to PDA.  Successful PCI/DES stenting with ultimate insertion of a 2.7518 mm science Alpine DES stent postdilated to 2.9 mm  Anticoagulation with initial heparin, bivalirudin, 300 mg oral Plavix, and intracoronary nitroglycerin use.  RECOMMENDATION:  The patient will continue with dual antiplatelet therapy for minimum of a year, but probably indefinitely.  Maximum medical therapy with ACE inhibitor, beta blocker therapy, aggressive lipid-lowering therapy with target LDL less than 70, and possible nitrates.    Troy Sine, MD, [facility] 05/30/2014 1:33 PM

## 2014-05-30 NOTE — Telephone Encounter (Signed)
E-script was sent in on Norvasc 10 mg but directions stated take half pill.  Dosage was changed to 10mg  QD at office visit on 05/30/14.  Please send new e-script with correct dose. / tgs

## 2014-05-30 NOTE — H&P (Signed)
Interval Note: Complete note of Dr. Deretha Emory from yesterday 05/29/14 reviewed. No interval change. The risks and benefits of a cardiac catheterization including, but not limited to, death, stroke, MI, kidney damage and bleeding were discussed with the patient who indicates understanding and agrees to proceed.   Troy Sine, MD  05/30/2014  7:50 AM

## 2014-05-30 NOTE — Progress Notes (Signed)
Site area: right groin  Site Prior to Removal:  Level 0  Pressure Applied For 20 MINUTES    Minutes Beginning at 1305  Manual:   Yes.    Patient Status During Pull:  stable  Post Pull Groin Site:  Level 0  Post Pull Instructions Given:  Yes.    Post Pull Pulses Present:  Yes.    Dressing Applied:  Yes.    Comments:

## 2014-05-30 NOTE — Interval H&P Note (Signed)
Cath Lab Visit (complete for each Cath Lab visit)  Clinical Evaluation Leading to the Procedure:   ACS: No.  Non-ACS:    Anginal Classification: CCS II  Anti-ischemic medical therapy: Maximal Therapy (2 or more classes of medications)  Non-Invasive Test Results: No non-invasive testing performed  Prior CABG: Previous CABG      History and Physical Interval Note:  05/30/2014 7:50 AM  [name]  has presented today for surgery, with the diagnosis of cp  The various methods of treatment have been discussed with the patient and family. After consideration of risks, benefits and other options for treatment, the patient has consented to  Procedure(s): LEFT HEART CATHETERIZATION WITH CORONARY/GRAFT ANGIOGRAM (N/A) as a surgical intervention .  The patient's history has been reviewed, patient examined, no change in status, stable for surgery.  I have reviewed the patient's chart and labs.  Questions were answered to the patient's satisfaction.     KELLY,THOMAS A

## 2014-05-31 DIAGNOSIS — I255 Ischemic cardiomyopathy: Secondary | ICD-10-CM | POA: Diagnosis not present

## 2014-05-31 DIAGNOSIS — I25110 Atherosclerotic heart disease of native coronary artery with unstable angina pectoris: Secondary | ICD-10-CM | POA: Diagnosis not present

## 2014-05-31 DIAGNOSIS — I25810 Atherosclerosis of coronary artery bypass graft(s) without angina pectoris: Secondary | ICD-10-CM | POA: Diagnosis not present

## 2014-05-31 DIAGNOSIS — J449 Chronic obstructive pulmonary disease, unspecified: Secondary | ICD-10-CM | POA: Diagnosis not present

## 2014-05-31 DIAGNOSIS — Z95810 Presence of automatic (implantable) cardiac defibrillator: Secondary | ICD-10-CM | POA: Diagnosis not present

## 2014-05-31 DIAGNOSIS — Z8673 Personal history of transient ischemic attack (TIA), and cerebral infarction without residual deficits: Secondary | ICD-10-CM | POA: Diagnosis not present

## 2014-05-31 DIAGNOSIS — I200 Unstable angina: Secondary | ICD-10-CM | POA: Diagnosis present

## 2014-05-31 DIAGNOSIS — Z955 Presence of coronary angioplasty implant and graft: Secondary | ICD-10-CM

## 2014-05-31 LAB — CBC
HCT: 34.5 % — ABNORMAL LOW (ref 39.0–52.0)
Hemoglobin: 11.6 g/dL — ABNORMAL LOW (ref 13.0–17.0)
MCH: 28.9 pg (ref 26.0–34.0)
MCHC: 33.6 g/dL (ref 30.0–36.0)
MCV: 86 fL (ref 78.0–100.0)
Platelets: 117 10*3/uL — ABNORMAL LOW (ref 150–400)
RBC: 4.01 MIL/uL — ABNORMAL LOW (ref 4.22–5.81)
RDW: 13.8 % (ref 11.5–15.5)
WBC: 4.9 10*3/uL (ref 4.0–10.5)

## 2014-05-31 LAB — BASIC METABOLIC PANEL
Anion gap: 4 — ABNORMAL LOW (ref 5–15)
BUN: 6 mg/dL (ref 6–23)
CO2: 32 mmol/L (ref 19–32)
Calcium: 8.7 mg/dL (ref 8.4–10.5)
Chloride: 102 mmol/L (ref 96–112)
Creatinine, Ser: 1.01 mg/dL (ref 0.50–1.35)
GFR calc Af Amer: 87 mL/min — ABNORMAL LOW (ref >90)
GFR calc non Af Amer: 75 mL/min — ABNORMAL LOW (ref >90)
Glucose, Bld: 153 mg/dL — ABNORMAL HIGH (ref 70–99)
Potassium: 3.9 mmol/L (ref 3.5–5.1)
Sodium: 138 mmol/L (ref 135–145)

## 2014-05-31 LAB — GLUCOSE, CAPILLARY: Glucose-Capillary: 118 mg/dL — ABNORMAL HIGH (ref 70–99)

## 2014-05-31 MED ORDER — METFORMIN HCL 1000 MG PO TABS: 1000.0000 mg | ORAL_TABLET | Freq: Two times a day (BID) | ORAL | Status: DC

## 2014-05-31 MED ORDER — ACETAMINOPHEN 325 MG PO TABS: 650.0000 mg | ORAL_TABLET | ORAL | Status: DC | PRN

## 2014-05-31 MED FILL — Sodium Chloride IV Soln 0.9%: INTRAVENOUS | Qty: 50 | Status: AC

## 2014-05-31 NOTE — Discharge Summary (Signed)
Patient ID: Lance [name],  MRN: [id number], DOB/AGE: 05/09/1945 69 y.o.  Admit date: 05/30/2014 Discharge date: 05/31/2014  Primary Care Provider: Sallee Lange, MD Primary Cardiologist: Dr Harl Bowie  Discharge Diagnoses Principal Problem:   Unstable angina Active Problems:   S/P DES to SVG-PDA 05/30/14   Type 2 diabetes mellitus   Cardiomyopathy, ischemic-EF 35-40%   ICD (implantable cardioverter-defibrillator) in place   S/P CABG x 4 Sept 2013   Dyslipidemia   Essential hypertension   H/O TIA (transient ischemic attack)    Procedures: Cath/ DES to SVG-PDA 05/30/14   Hospital Course:  69 y.o.male followed by Dr Harl Bowie in [place] with a history of CAD, s/p CABG 01/2012 x 4 (LIMA-LAD, SVG-Diag, SVG-OM, SVG-PDA), ICM- s/p ICD 2013, DM,HTN, and COPD. He had chest pain as an OP concerning for angina. A Myoview in April 2015 was remarkable for scar but no ischemia. He was admitted 05/30/14 as an OP and underwent diagnostic cath. Cath revealed patent LIMA-LAD, patent SVG-OM, patent SVG-Dx1, and an 80% SVG-PDA. He received an SVG-PDA DES (Dr Claiborne Billings). LVF at cath was 35-40%. He was seen by Dr Burt Knack the morning of the 29th and felt to be stable for discharge. He will follow up with Dr Harl Bowie in [place].   Discharge Vitals:  Blood pressure 135/84, pulse 76, temperature 98.9 F (37.2 C), temperature source Oral, resp. rate 18, height 5\' 9"  (1.753 m), weight 222 lb 10.6 oz (101 kg), SpO2 96 %.    Labs: Results for orders placed or performed during the hospital encounter of 05/30/14 (from the past 24 hour(s))  POCT Activated clotting time     Status: None   Collection Time: 05/30/14  8:59 AM  Result Value Ref Range   Activated Clotting Time 171 seconds  POCT Activated clotting time     Status: None   Collection Time: 05/30/14  9:09 AM  Result Value Ref Range   Activated Clotting Time 214 seconds  POCT Activated clotting time     Status: None   Collection Time: 05/30/14  9:31 AM    Result Value Ref Range   Activated Clotting Time 724 seconds  Glucose, capillary     Status: Abnormal   Collection Time: 05/30/14 11:40 AM  Result Value Ref Range   Glucose-Capillary 133 (H) 70 - 99 mg/dL   Comment 1 Documented in Chart   Glucose, capillary     Status: Abnormal   Collection Time: 05/30/14  5:48 PM  Result Value Ref Range   Glucose-Capillary 198 (H) 70 - 99 mg/dL   Comment 1 Notify RN   Glucose, capillary     Status: Abnormal   Collection Time: 05/30/14  9:16 PM  Result Value Ref Range   Glucose-Capillary 217 (H) 70 - 99 mg/dL   Comment 1 Notify RN   Basic metabolic panel     Status: Abnormal   Collection Time: 05/31/14  4:41 AM  Result Value Ref Range   Sodium 138 135 - 145 mmol/L   Potassium 3.9 3.5 - 5.1 mmol/L   Chloride 102 96 - 112 mmol/L   CO2 32 19 - 32 mmol/L   Glucose, Bld 153 (H) 70 - 99 mg/dL   BUN 6 6 - 23 mg/dL   Creatinine, Ser 1.01 0.50 - 1.35 mg/dL   Calcium 8.7 8.4 - 10.5 mg/dL   GFR calc non Af Amer 75 (L) >90 mL/min   GFR calc Af Amer 87 (L) >90 mL/min   Anion gap 4 (L)  5 - 15  CBC     Status: Abnormal   Collection Time: 05/31/14  4:41 AM  Result Value Ref Range   WBC 4.9 4.0 - 10.5 K/uL   RBC 4.01 (L) 4.22 - 5.81 MIL/uL   Hemoglobin 11.6 (L) 13.0 - 17.0 g/dL   HCT 34.5 (L) 39.0 - 52.0 %   MCV 86.0 78.0 - 100.0 fL   MCH 28.9 26.0 - 34.0 pg   MCHC 33.6 30.0 - 36.0 g/dL   RDW 13.8 11.5 - 15.5 %   Platelets 117 (L) 150 - 400 K/uL  Glucose, capillary     Status: Abnormal   Collection Time: 05/31/14  7:11 AM  Result Value Ref Range   Glucose-Capillary 118 (H) 70 - 99 mg/dL   Comment 1 Notify RN    Comment 2 Documented in Chart     Disposition:      Follow-up Information    Follow up with Jory Sims, NP On 06/07/2014.   Specialty:  Nurse Practitioner   Why:  1:30pm   Contact information:   [place] [id number] [phone]       Discharge Medications:    Medication List    STOP taking these  medications        dexlansoprazole 60 MG capsule  Commonly known as:  DEXILANT     metoprolol 50 MG tablet  Commonly known as:  LOPRESSOR     sildenafil 20 MG tablet  Commonly known as:  REVATIO      TAKE these medications        acetaminophen 325 MG tablet  Commonly known as:  TYLENOL  Take 2 tablets (650 mg total) by mouth every 4 (four) hours as needed for headache or mild pain.     ALIGN PO  Take 1 tablet by mouth daily.     ALPRAZolam 1 MG tablet  Commonly known as:  XANAX  TAKE 1/2 TO 1 TABLET BY MOUTH TWICE DAILY AS NEEDED     amitriptyline 10 MG tablet  Commonly known as:  ELAVIL  Take 5 mg by mouth at bedtime.     amLODipine 10 MG tablet  Commonly known as:  NORVASC  Take 1 tablet (10 mg total) by mouth daily.     aspirin 81 MG tablet  Take 81 mg by mouth daily.     atorvastatin 80 MG tablet  Commonly known as:  LIPITOR  Take 80 mg by mouth daily.     clopidogrel 75 MG tablet  Commonly known as:  PLAVIX  Take 1 tablet (75 mg total) by mouth daily with breakfast.     donepezil 10 MG tablet  Commonly known as:  ARICEPT  TAKE ONE TABLET BY MOUTH EVERY NIGHT AT BEDTIME     DULoxetine 30 MG capsule  Commonly known as:  CYMBALTA  TAKE ONE CAPSULE BY MOUTH DAILY     gabapentin 300 MG capsule  Commonly known as:  NEURONTIN  Take 300 mg by mouth 2 (two) times daily.     HYDROcodone-acetaminophen 7.5-325 MG per tablet  Commonly known as:  NORCO  Take 1 tablet by mouth every 6 (six) hours as needed for moderate pain.     LANTUS SOLOSTAR 100 UNIT/ML injection  Generic drug:  insulin glargine  Inject 56 Units into the skin at bedtime.     losartan 50 MG tablet  Commonly known as:  COZAAR  TAKE ONE (1) TABLET EACH DAY     metFORMIN  1000 MG tablet  Commonly known as:  GLUCOPHAGE  Take 1 tablet (1,000 mg total) by mouth 2 (two) times daily.  Start taking on:  06/02/2014     nitroGLYCERIN 0.4 MG SL tablet  Commonly known as:  NITROSTAT  Place 1 tablet  (0.4 mg total) under the tongue every 5 (five) minutes as needed. For chest pain     NOVOLOG FLEXPEN 100 UNIT/ML injection  Generic drug:  insulin aspart  Inject 17-21 Units into the skin 3 (three) times daily before meals. Uses according to sliding scale at home. , 201-250=17 units, 251-300=18 units, 301-350= 20 units. Anything over 400 take 21 units     ONE TOUCH ULTRA TEST test strip  Generic drug:  glucose blood  USE TO TEST BLOOD SUGAR FOUR TIMES DAILY     pantoprazole 40 MG tablet  Commonly known as:  PROTONIX  TAKE ONE TABLET BY MOUTH TWICE A DAY     sulfamethoxazole-trimethoprim 800-160 MG per tablet  Commonly known as:  BACTRIM DS,SEPTRA DS  Take 1 tablet by mouth 2 (two) times daily.     tiotropium 18 MCG inhalation capsule  Commonly known as:  SPIRIVA  Place 18 mcg into inhaler and inhale daily.     traZODone 100 MG tablet  Commonly known as:  DESYREL  TAKE ONE-HALF TO ONE TABLET AT BEDTIME AS NEEDED FOR SLEEP     vitamin B-12 1000 MCG tablet  Commonly known as:  CYANOCOBALAMIN  Take 1,000 mcg by mouth daily.     Vitamin D (Ergocalciferol) 50000 UNITS Caps capsule  Commonly known as:  DRISDOL  Take 50,000 Units by mouth. Once a month         Duration of Discharge Encounter: Greater than 30 minutes including physician time.  Angelena Form PA-C 05/31/2014 8:26 AM

## 2014-05-31 NOTE — Discharge Instructions (Signed)
Coronary Angiogram With Stent, Care After °Refer to this sheet in the next few weeks. These instructions provide you with information on caring for yourself after your procedure. Your health care provider may also give you more specific instructions. Your treatment has been planned according to current medical practices, but problems sometimes occur. Call your health care provider if you have any problems or questions after your procedure.  °WHAT TO EXPECT AFTER THE PROCEDURE  °The insertion site may be tender for a few days after your procedure. °HOME CARE INSTRUCTIONS  °· Take medicines only as directed by your health care provider. Blood thinners may be prescribed after your procedure to improve blood flow through the stent. °· Change any bandages (dressings) as directed by your health care provider.   °· Check your insertion site every day for redness, swelling, or fluid leaking from the insertion.   °· Do not take baths, swim, or use a hot tub until your health care provider approves. You may shower. Pat the insertion area dry. Do not rub the insertion area with a washcloth or towel.   °· Eat a heart-healthy diet. This should include plenty of fresh fruits and vegetables. Meat should be lean cuts. Avoid the following types of food:   °¨ Food that is high in salt.   °¨ Canned or highly processed food.   °¨ Food that is high in saturated fat or sugar.   °¨ Fried food.   °· Make any other lifestyle changes recommended by your health care provider. This may include:   °¨ Not using any tobacco products including cigarettes, chewing tobacco, or electronic cigarettes.  °¨ Managing your weight.   °¨ Getting regular exercise.   °¨ Managing your blood pressure.   °¨ Limiting your alcohol intake.   °¨ Managing other health problems, such as diabetes.   °· If you need an MRI after your heart stent was placed, be sure to tell the health care provider who orders the MRI that you have a heart stent.   °· Keep all follow-up  visits as directed by your health care provider.   °SEEK IMMEDIATE MEDICAL CARE IF:  °· You develop chest pain, shortness of breath, feel faint, or pass out. °· You have bleeding, swelling larger than a walnut, or drainage from the catheter insertion site. °· You develop pain, discoloration, coldness, or severe bruising in the leg or arm that held the catheter. °· You develop bleeding from any other place such as from the bowels. There may be bright red blood in the urine or stools, or it may appear as black, tarry stools. °· You have a fever or chills. °MAKE SURE YOU: °· Understand these instructions. °· Will watch your condition. °· Will get help right away if you are not doing well or get worse. °Document Released: 11/06/2004 Document Revised: 09/03/2013 Document Reviewed: 09/20/2012 °[employer]® Patient Information ©2015 [employer]. This information is not intended to replace advice given to you by your health care provider. Make sure you discuss any questions you have with your health care provider. ° °

## 2014-05-31 NOTE — Progress Notes (Signed)
CARDIAC REHAB PHASE I   PRE:  Rate/Rhythm: 79 Sr  BP:  Supine: 135/84  Sitting:   Standing:    SaO2:   MODE:  Ambulation: 1000 ft   POST:  Rate/Rhythm: 88 SR  BP:  Supine:   Sitting: 145/60  Standing:    SaO2:  0800-0835 Pt walked 1000 ft with steady gait. Tolerated well. No CP. When asked what he would like for me to review as he has been through this before, he stated he knew what he should do. Pt able to recite how to take NTG correctly. Was given heart healthy and diabetic diets for reference, reviewed importance of plavix with stent, and gave ex ed. Not interested in CRP 2 as he walks on his own with friend twice a day.  Pt satisfied with ed given.   Lance Good, RN BSN  05/31/2014 8:32 AM

## 2014-05-31 NOTE — Progress Notes (Signed)
    Subjective:  No complaints, up in room  Objective:  Vital Signs in the last 24 hours: Temp:  [97.5 F (36.4 C)-98.3 F (36.8 C)] 98.3 F (36.8 C) (01/29 0445) Pulse Rate:  [29-83] 65 (01/29 0445) Resp:  [16-20] 18 (01/29 0445) BP: (106-165)/(53-87) 130/74 mmHg (01/29 0445) SpO2:  [69 %-98 %] 92 % (01/29 0445) Weight:  [222 lb 10.6 oz (101 kg)] 222 lb 10.6 oz (101 kg) (01/29 0001)  Intake/Output from previous day:  Intake/Output Summary (Last 24 hours) at 05/31/14 0741 Last data filed at 05/31/14 0452  Gross per 24 hour  Intake 2487.5 ml  Output   3075 ml  Net -587.5 ml    Physical Exam: General appearance: alert, cooperative, no distress and mildly obese Lungs: clear to auscultation bilaterally Heart: regular rate and rhythm Extremities: Rt groin without hematoma   Rate: 50-60  Rhythm: normal sinus rhythm and sinus bradycardia  Lab Results:  Recent Labs  05/29/14 1435 05/31/14 0441  WBC 5.4 4.9  HGB 12.2* 11.6*  PLT 134* 117*    Recent Labs  05/29/14 1435 05/31/14 0441  NA 137 138  K 4.0 3.9  CL 102 102  CO2 25 32  GLUCOSE 224* 153*  BUN 11 6  CREATININE 1.25 1.01   No results for input(s): TROPONINI in the last 72 hours.  Invalid input(s): CK, MB  Recent Labs  05/29/14 1435  INR 1.13    Imaging: No CXR done  Cardiac Studies:  Assessment/Plan:  68 y.o.male followed by Dr Harl Bowie in [place] with a history of CAD, s/p CABG 01/2012 x 4 (LIMA-LAD, SVG-Diag, SVG-OM, SVG-PDA), ICM- s/p ICD 2013, DM,HTN, and COPD. He had chest pain as an OP concerning for angina. A Myoview in April 2015 was remarkable for scar but no ischemia. He underwent diagnostic cath 05/30/14 and received an SVG-PDA DES (Dr Claiborne Billings).   Principal Problem:   Unstable angina Active Problems:   S/P DES to SVG-PDA 05/30/14   Type 2 diabetes mellitus   Cardiomyopathy, ischemic-EF 35-40%   ICD (implantable cardioverter-defibrillator) in place   S/P CABG x 4 Sept 2013  Dyslipidemia   Essential hypertension   H/O TIA (transient ischemic attack)   PLAN: F/U Dr Harl Bowie in [place] 2 weeks.   Kerin Ransom PA-C Beeper [phone] 05/31/2014, 7:41 AM  Patient seen, examined. Available data reviewed. Agree with findings, assessment, and plan as outlined by Kerin Ransom, PA-C. Exam reveals alert, oriented male in NAD. Lungs clear, heart RRR, right groin clear with very mild ecchymosis, no peripheral edema. Lab data and cath study report reviewed. Pt stable and ready for discharge. He is on appropriate medical therapy with ASA, plavix, and a beta blocker/ARB for his cardiomyopathy. F/U to be arranged with Dr Harl Bowie.  Sherren Mocha, M.D. 05/31/2014 8:30 AM

## 2014-06-06 NOTE — Progress Notes (Signed)
   ERROR- Cancelled and rescheduled.  

## 2014-06-07 ENCOUNTER — Encounter: Payer: Medicare Other | Admitting: Adult Health

## 2014-06-09 ENCOUNTER — Telehealth: Payer: Self-pay | Admitting: Family Medicine

## 2014-06-09 NOTE — Telephone Encounter (Signed)
Please let Dr. Arvil Persons office know that Lance [name] is endocrinologist Dr.Nida, manages his diabetes therefore will need to fill in and sign off on therapeutic shoe request.(The form where I signed says that I valve that I am his diabetic doctor which am not) he certainly is a patient here but the diabetes followed by specialist

## 2014-06-10 ENCOUNTER — Ambulatory Visit (INDEPENDENT_AMBULATORY_CARE_PROVIDER_SITE_OTHER): Payer: Medicare Other | Admitting: Adult Health

## 2014-06-10 ENCOUNTER — Encounter: Payer: Self-pay | Admitting: Adult Health

## 2014-06-10 VITALS — BP 142/76 | HR 89 | Ht 69.0 in | Wt 228.0 lb

## 2014-06-10 DIAGNOSIS — R0600 Dyspnea, unspecified: Secondary | ICD-10-CM

## 2014-06-10 DIAGNOSIS — I255 Ischemic cardiomyopathy: Secondary | ICD-10-CM

## 2014-06-10 DIAGNOSIS — R0609 Other forms of dyspnea: Secondary | ICD-10-CM

## 2014-06-10 DIAGNOSIS — Z955 Presence of coronary angioplasty implant and graft: Secondary | ICD-10-CM | POA: Diagnosis not present

## 2014-06-10 DIAGNOSIS — I10 Essential (primary) hypertension: Secondary | ICD-10-CM

## 2014-06-10 NOTE — Assessment & Plan Note (Signed)
He is being followed by a pulmonologist in [place].  He is due to see him again in the morning.  Again, it is my recommendation that he be a low-dose beta blocker.  He apparently has a history of COPD, and this may be an issue for him.

## 2014-06-10 NOTE — Assessment & Plan Note (Signed)
Blood pressure is not well-controlled today.  It is very labile, usually associated with anger or anxiety.  He states that when he takes his Xanax and relaxes.  He is much better controlled.  I have advised him on low sodium diet and to manage his anxiety and anger as best he can.

## 2014-06-10 NOTE — Assessment & Plan Note (Signed)
He is tolerating his medications well.  His main complaint is been for his insulin and changing insurance benefits.  He is not on a beta blocker for unknown reason.  With his cardiomyopathy, EF of 40%, I would recommend low-dose beta blocker.  He does not wish to add any more medications to his regimen as he is having to take for a lot of his medications and they are becoming.  Expensive for him.  I have advised him that this is the better choice for him to be able to feel better. He  He wishes to wait on adding an additional medicine. He will be seen in 3 months. Optimal medical therapy needs to be in place to improve prognosis.

## 2014-06-10 NOTE — Patient Instructions (Signed)
Your physician recommends that you schedule a follow-up appointment in: 3 months with Kathryn Lawrence, NP   Your physician recommends that you continue on your current medications as directed. Please refer to the Current Medication list given to you today.  Thank you for choosing [facility]!     
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
## 2014-06-10 NOTE — Progress Notes (Deleted)
Name: Lance [name]    DOB: Mar 04, 1946  Age: 69 y.o.  MR#: [id number]       PCP:  Sallee Lange, MD      Insurance: Payor: MEDICARE / Plan: MEDICARE PART A AND B / Product Type: *No Product type* /   CC:    Chief Complaint  Patient presents with  . Coronary Artery Disease    Drug-eluting stent to SVG to PDA on 05/30/2014  . Cardiomyopathy    Ischemic, EF of 35-40%-ICD in situ  . Hypertension    VS Filed Vitals:   06/10/14 1431  BP: 142/76  Pulse: 89  Height: 5\' 9"  (1.753 m)  Weight: 228 lb (103.42 kg)  SpO2: 96%    Weights Current Weight  06/10/14 228 lb (103.42 kg)  05/31/14 222 lb 10.6 oz (101 kg)  05/29/14 228 lb 3.2 oz (103.511 kg)    Blood Pressure  BP Readings from Last 3 Encounters:  06/10/14 142/76  05/31/14 135/84  05/29/14 128/70     Admit date:  (Not on file) Last encounter with RMR:  Visit date not found   Allergy Penicillins; Latex; Morphine and related; Other; Percocet; and Levaquin  Current Outpatient Prescriptions  Medication Sig Dispense Refill  . acetaminophen (TYLENOL) 325 MG tablet Take 2 tablets (650 mg total) by mouth every 4 (four) hours as needed for headache or mild pain.    Marland Kitchen ALPRAZolam (XANAX) 1 MG tablet TAKE 1/2 TO 1 TABLET BY MOUTH TWICE DAILY AS NEEDED 60 tablet 2  . amitriptyline (ELAVIL) 10 MG tablet Take 5 mg by mouth at bedtime.    Marland Kitchen amLODipine (NORVASC) 10 MG tablet Take 1 tablet (10 mg total) by mouth daily. 180 tablet 3  . aspirin 81 MG tablet Take 81 mg by mouth daily.    Marland Kitchen atorvastatin (LIPITOR) 80 MG tablet Take 80 mg by mouth daily.     . clopidogrel (PLAVIX) 75 MG tablet Take 1 tablet (75 mg total) by mouth daily with breakfast. 30 tablet 10  . donepezil (ARICEPT) 10 MG tablet TAKE ONE TABLET BY MOUTH EVERY NIGHT AT BEDTIME 30 tablet 6  . DULoxetine (CYMBALTA) 30 MG capsule TAKE ONE CAPSULE BY MOUTH DAILY 30 capsule 5  . gabapentin (NEURONTIN) 300 MG capsule Take 300 mg by mouth 2 (two) times daily.     Marland Kitchen  HYDROcodone-acetaminophen (NORCO) 7.5-325 MG per tablet Take 1 tablet by mouth every 6 (six) hours as needed for moderate pain.     Marland Kitchen insulin glargine (LANTUS SOLOSTAR) 100 UNIT/ML injection Inject 56 Units into the skin at bedtime.     Marland Kitchen losartan (COZAAR) 50 MG tablet TAKE ONE (1) TABLET EACH DAY 90 tablet 1  . metFORMIN (GLUCOPHAGE) 1000 MG tablet Take 1 tablet (1,000 mg total) by mouth 2 (two) times daily. 180 tablet 4  . nitroGLYCERIN (NITROSTAT) 0.4 MG SL tablet Place 1 tablet (0.4 mg total) under the tongue every 5 (five) minutes as needed. For chest pain 25 tablet 3  . NOVOLOG FLEXPEN 100 UNIT/ML injection Inject 17-21 Units into the skin 3 (three) times daily before meals. Uses according to sliding scale at home. , 201-250=17 units, 251-300=18 units, 301-350= 20 units. Anything over 400 take 21 units    . ONE TOUCH ULTRA TEST test strip USE TO TEST BLOOD SUGAR FOUR TIMES DAILY 100 each 5  . pantoprazole (PROTONIX) 40 MG tablet TAKE ONE TABLET BY MOUTH TWICE A DAY 60 tablet 5  . Probiotic Product (ALIGN PO) Take 1  tablet by mouth daily.     Marland Kitchen sulfamethoxazole-trimethoprim (BACTRIM DS,SEPTRA DS) 800-160 MG per tablet Take 1 tablet by mouth 2 (two) times daily. 20 tablet 0  . tiotropium (SPIRIVA) 18 MCG inhalation capsule Place 18 mcg into inhaler and inhale daily.    . traZODone (DESYREL) 100 MG tablet TAKE ONE-HALF TO ONE TABLET AT BEDTIME AS NEEDED FOR SLEEP 30 tablet 6  . vitamin B-12 (CYANOCOBALAMIN) 1000 MCG tablet Take 1,000 mcg by mouth daily.      . Vitamin D, Ergocalciferol, (DRISDOL) 50000 UNITS CAPS Take 50,000 Units by mouth. Once a month     No current facility-administered medications for this visit.    Discontinued Meds:   There are no discontinued medications.  Patient Active Problem List   Diagnosis Date Noted  . Unstable angina 05/31/2014  . S/P DES to SVG-PDA 05/30/14 05/31/2014  . S/P CABG x 4 Sept 2013 05/30/2014  . DOE (dyspnea on exertion) 05/13/2014  . Memory  deficits 09/05/2013  . Dizziness 08/07/2013  . ICD (implantable cardioverter-defibrillator) in place 08/07/2013  . Ventricular fibrillation 02/28/2013  . Cardiomyopathy, ischemic-EF 35-40% 02/09/2012  . Congestive heart failure, unspecified 02/09/2012  . Diarrhea 12/24/2011  . Dyspnea 12/06/2011  . H/O TIA (transient ischemic attack) 12/01/2011  . Encounter for servicing of automatic implantable cardioverter-defibrillator (AICD) at end of battery life 10/05/2011  . ASTHMA, MILD 04/02/2009  . Dyslipidemia 03/26/2009  . TOBACCO ABUSE 03/26/2009  . ATHEROSCLEROTIC CARDIOVASCULAR DISEASE 03/26/2009  . HYPOTENSION, ORTHOSTATIC 01/07/2009  . SYNCOPE 01/07/2009  . PITUITARY INSUFFICIENCY 12/07/2007  . ERECTILE DYSFUNCTION, ORGANIC 12/07/2007  . HYPOGONADISM, MALE 03/06/2007  . Type 2 diabetes mellitus 01/12/2007  . DEPRESSION 01/12/2007  . Essential hypertension 01/12/2007  . ALLERGIC RHINITIS 01/12/2007  . GERD 01/12/2007  . BENIGN PROSTATIC HYPERTROPHY 01/12/2007    LABS    Component Value Date/Time   NA 138 05/31/2014 0441   NA 137 05/29/2014 1435   NA 142 08/07/2013 0840   K 3.9 05/31/2014 0441   K 4.0 05/29/2014 1435   K 4.2 08/07/2013 0840   CL 102 05/31/2014 0441   CL 102 05/29/2014 1435   CL 99 08/07/2013 0840   CO2 32 05/31/2014 0441   CO2 25 05/29/2014 1435   CO2 26 08/07/2013 0840   GLUCOSE 153* 05/31/2014 0441   GLUCOSE 224* 05/29/2014 1435   GLUCOSE 141* 08/07/2013 0840   BUN 6 05/31/2014 0441   BUN 11 05/29/2014 1435   BUN 16 08/07/2013 0840   CREATININE 1.01 05/31/2014 0441   CREATININE 1.25 05/29/2014 1435   CREATININE 0.95 08/07/2013 0840   CREATININE 1.03 08/06/2013 2234   CREATININE 1.02 07/25/2013 0730   CREATININE 0.95 10/22/2011 0847   CALCIUM 8.7 05/31/2014 0441   CALCIUM 8.9 05/29/2014 1435   CALCIUM 9.6 08/07/2013 0840   GFRNONAA 75* 05/31/2014 0441   GFRNONAA 85* 08/07/2013 0840   GFRNONAA 74* 08/06/2013 2234   GFRAA 87* 05/31/2014 0441    GFRAA >90 08/07/2013 0840   GFRAA 86* 08/06/2013 2234   CMP     Component Value Date/Time   NA 138 05/31/2014 0441   K 3.9 05/31/2014 0441   CL 102 05/31/2014 0441   CO2 32 05/31/2014 0441   GLUCOSE 153* 05/31/2014 0441   BUN 6 05/31/2014 0441   CREATININE 1.01 05/31/2014 0441   CREATININE 1.25 05/29/2014 1435   CALCIUM 8.7 05/31/2014 0441   PROT 6.8 08/07/2013 0840   ALBUMIN 3.7 08/07/2013 0840   AST 18 08/07/2013 0840  ALT 16 08/07/2013 0840   ALKPHOS 60 08/07/2013 0840   BILITOT 0.5 08/07/2013 0840   GFRNONAA 75* 05/31/2014 0441   GFRAA 87* 05/31/2014 0441       Component Value Date/Time   WBC 4.9 05/31/2014 0441   WBC 5.4 05/29/2014 1435   WBC 7.7 08/07/2013 0840   HGB 11.6* 05/31/2014 0441   HGB 12.2* 05/29/2014 1435   HGB 14.3 08/07/2013 0840   HCT 34.5* 05/31/2014 0441   HCT 37.4* 05/29/2014 1435   HCT 39.5 08/07/2013 0840   MCV 86.0 05/31/2014 0441   MCV 85.8 05/29/2014 1435   MCV 85.7 08/07/2013 0840    Lipid Panel     Component Value Date/Time   CHOL 154 07/25/2013 0730   TRIG 155* 07/25/2013 0730   HDL 31* 07/25/2013 0730   CHOLHDL 5.0 07/25/2013 0730   VLDL 31 07/25/2013 0730   LDLCALC 92 07/25/2013 0730    ABG    Component Value Date/Time   PHART 7.330* 01/10/2012 2231   PCO2ART 51.4* 01/10/2012 2231   PO2ART 126.0* 01/10/2012 2231   HCO3 26.9* 01/10/2012 2231   TCO2 26 05/31/2012 1512   ACIDBASEDEF 2.0 01/10/2012 2044   O2SAT 98.0 01/10/2012 2231     Lab Results  Component Value Date   TSH 2.747 08/25/2013   BNP (last 3 results) No results for input(s): BNP in the last 8760 hours.  ProBNP (last 3 results) No results for input(s): PROBNP in the last 8760 hours.  Cardiac Panel (last 3 results) No results for input(s): CKTOTAL, CKMB, TROPONINI, RELINDX in the last 72 hours.  Iron/TIBC/Ferritin/ %Sat    Component Value Date/Time   IRON 63 12/07/2007 1023   IRONPCTSAT 22.5 12/07/2007 1023     EKG Orders placed or  performed during the hospital encounter of 05/30/14  . EKG 12-Lead immediately post procedure  . EKG 12-Lead  . EKG 12-Lead  . EKG 12-Lead  . EKG 12-Lead immediately post procedure  . EKG 12-Lead  . EKG     Prior Assessment and Plan Problem List as of 06/10/2014      Cardiovascular and Mediastinum   Essential hypertension   Last Assessment & Plan 02/09/2012 Office Visit Written 02/09/2012 12:15 PM by Evans Lance, MD    His blood pressure is well controlled. He is instructed to maintain a low-sodium diet, increase his physical activity, and continue his current medical therapy.      H/O TIA (transient ischemic attack)   Cardiomyopathy, ischemic-EF 35-40%   ATHEROSCLEROTIC CARDIOVASCULAR DISEASE   Last Assessment & Plan 02/28/2013 Office Visit Written 02/28/2013 11:03 AM by Evans Lance, MD    The patient denies anginal symptoms. He remains very active, working in his garden and yard.      HYPOTENSION, ORTHOSTATIC   Last Assessment & Plan 07/07/2011 Office Visit Written 07/07/2011 10:45 AM by Renella Cunas, MD    Resolved.      SYNCOPE   Congestive heart failure, unspecified   Ventricular fibrillation   Last Assessment & Plan 02/28/2013 Office Visit Written 02/28/2013 11:04 AM by Evans Lance, MD    Interrogation of his ICD demonstrates no recurrent ventricular arrhythmias. He will continue his current medical therapy and undergo watchful waiting.      Unstable angina     Respiratory   ALLERGIC RHINITIS   ASTHMA, MILD     Digestive   GERD   Last Assessment & Plan 03/12/2013 Office Visit Written 03/12/2013  5:12 PM by Vicente Males  Annie Sable, NP    Severe GERD despite Dexilant BID. Pepcid causes loose stool. EGD up-to-date. Concern for underlying delayed gastric emptying, specifically with his reports of early satiety, nausea, and regurgitation. Update GES now. Continue Dexilant BID. Add Zantac instead of Pepcid. Return in 6 weeks. Consider tertiary referral if persistent severe GERD.          Endocrine   Type 2 diabetes mellitus   Last Assessment & Plan 12/06/2011 Office Visit Written 12/06/2011  5:27 PM by Darlin Coco, MD    The patient denies any hypoglycemic episodes      PITUITARY INSUFFICIENCY   HYPOGONADISM, MALE     Genitourinary   BENIGN PROSTATIC HYPERTROPHY   ERECTILE DYSFUNCTION, ORGANIC     Other   Dyslipidemia   Last Assessment & Plan 07/13/2012 Office Visit Written 07/13/2012  3:47 PM by Renella Cunas, MD    His last LDL was above 100. He was advised to take a statin. He has not done so. His noncompliance is a major issue.      ICD (implantable cardioverter-defibrillator) in place   Memory deficits   S/P CABG x 4 Sept 2013   TOBACCO ABUSE   Last Assessment & Plan 07/07/2011 Office Visit Written 07/07/2011 10:43 AM by Renella Cunas, MD    Encouraged to quit.      DEPRESSION   Encounter for servicing of automatic implantable cardioverter-defibrillator (AICD) at end of battery life   Last Assessment & Plan 02/28/2013 Office Visit Written 02/28/2013 11:03 AM by Evans Lance, MD    His [employer]. [name] single chamber ICD is working normally. We'll plan to recheck in several months.      Dyspnea   Last Assessment & Plan 07/13/2012 Office Visit Written 07/13/2012  3:46 PM by Renella Cunas, MD    I had a long cope with the patient and his wife. His dyspnea now is not cardiac but pulmonary. Before he was probably limited by severe three-vessel disease, now he is limited by significant COPD. He has a long-standing tobacco history and still smokes. I have strongly encouraged him to stop since he is now on a significant decline in his functional status. He says he will quit. No further cardiac workup at this time.      Diarrhea   Last Assessment & Plan 12/24/2011 Office Visit Edited 12/24/2011 11:07 AM by Andria Meuse, NP    Post-prandial abdominal pain & loose stools.  Suspect diabetic enteropathy/pancreatic insufficiency.  Continue levsin prn Keep up the  good work with your blood sugars Stop using tobacco Try Creon 24- 2 w/ meals & 1 w/ snacks-30 mins before eating A week later, add ALIGN probiotics daily Call with a progress report in 10 days Office visit in 6 months  Please discuss follow up with LEFT KIDNEY CYST (slightly larger than before on last CT) w/ Dr Wolfgang Phoenix.       Dizziness   DOE (dyspnea on exertion)   Last Assessment & Plan 05/13/2014 Office Visit Written 05/13/2014 11:11 AM by Kathee Delton, MD    The patient has worsening dyspnea on exertion over the last 6-8 months that is significantly impacting his quality of life. He does have a long history of smoking, but his pulmonary function studies are really unimpressive for any significant airflow obstruction.  He may have underlying emphysema anatomically, but his pulmonary function studies showed no significant airflow obstruction. He does have airway symptoms in the form of cough and mucus  production, but also has a lot of sinus issues and postnasal drip. Therefore, this mucus may be coming from his upper airway.  The patient also has very significant underlying cardiac disease, and centripetal obesity. These can be significant contributors to his dyspnea. Finally, although his d-dimer is normal, this may not exclude possible chronic thromboembolic disease. At this point, I am willing to try him on a long-acting bronchodilator to see if he sees a significant difference. If he does not, I might consider doing a ventilation perfusion scan for chronic thromboembolic disease. He obviously needs to work on weight reduction and some type of conditioning program. If his breathing issue is not due to underlying lung disease, I wonder if there is anything else to be done from a cardiac standpoint.      S/P DES to SVG-PDA 05/30/14       Imaging: No results found.

## 2014-06-10 NOTE — Assessment & Plan Note (Signed)
He is not currently on optimal medical therapy.  He should be on an ACE inhibitor, beta blocker, and a low dose diuretic.  The patient refuses medication changes at this time.  I discussed with him that this is the better.  Treatment for him for his current diagnosis and systolic dysfunction.  He is willing to continue Plavix 75 mg as this is a portable to him.  He is also willing to continue on a statin medication.  He is on amlodipine, which is not a good choice with systolic dysfunction, and would like to take him off of that and place him on Cipro.  He wishes to avoid a bunch of medication changes as he is not certain his insurance will pay for it. [place] on discharge, was 1. 01.  We will see him again in 3 months and hopefully be able to convince him to change his medication regimen, which I believe will assist him with overall good prognosis.

## 2014-06-10 NOTE — Telephone Encounter (Signed)
Called & notified Dr. Arvil Persons office, they thanked me for letting them know and will send form to Dr. Dorris Fetch

## 2014-06-11 ENCOUNTER — Encounter: Payer: Self-pay | Admitting: Pulmonary Disease

## 2014-06-11 ENCOUNTER — Ambulatory Visit (INDEPENDENT_AMBULATORY_CARE_PROVIDER_SITE_OTHER): Payer: Medicare Other | Admitting: Pulmonary Disease

## 2014-06-11 VITALS — BP 144/78 | HR 66 | Temp 97.6°F | Ht 69.0 in | Wt 227.8 lb

## 2014-06-11 DIAGNOSIS — I255 Ischemic cardiomyopathy: Secondary | ICD-10-CM

## 2014-06-11 DIAGNOSIS — R0600 Dyspnea, unspecified: Secondary | ICD-10-CM

## 2014-06-11 DIAGNOSIS — R0609 Other forms of dyspnea: Secondary | ICD-10-CM | POA: Diagnosis not present

## 2014-06-11 NOTE — Progress Notes (Signed)
   Subjective:    Patient ID: Lance [name], male    DOB: 03-06-1946, 69 y.o.   MRN: [id number]  HPI The patient comes in today for follow-up of his dyspnea on exertion. At his last visit, his PFTs were really unimpressive, but I did give him a trial of Spiriva to see if this would help. He saw no difference, and has discontinued the medication. In the interim, he is been admitted to the hospital for catheterization, and underwent a percutaneous intervention. He has not seen a difference in his breathing since that time.   Review of Systems  Constitutional: Negative for fever and unexpected weight change.  HENT: Positive for congestion and postnasal drip. Negative for dental problem, ear pain, nosebleeds, rhinorrhea, sinus pressure, sneezing, sore throat and trouble swallowing.   Eyes: Negative for redness and itching.  Respiratory: Positive for cough, chest tightness, shortness of breath and wheezing.   Cardiovascular: Negative for palpitations and leg swelling.  Gastrointestinal: Negative for nausea and vomiting.  Genitourinary: Negative for dysuria.  Musculoskeletal: Negative for joint swelling.  Skin: Negative for rash.  Neurological: Negative for headaches.  Hematological: Does not bruise/bleed easily.  Psychiatric/Behavioral: Negative for dysphoric mood. The patient is not nervous/anxious.        Objective:   Physical Exam Obese male in no acute distress Nose without purulence or discharge noted Neck without lymphadenopathy or thyromegaly Lower extremities mild edema, no cyanosis Alert and oriented, moves all 4 extremities.       Assessment & Plan:

## 2014-06-11 NOTE — Patient Instructions (Signed)
Stay off spiriva.  I do not see anything obvious from a pulmonary standpoint that is going to help your breathing. Work on weight loss and conditioning.  followup with me again as needed.

## 2014-06-11 NOTE — Assessment & Plan Note (Signed)
The patient has tried Spiriva from the last visit, and has seen no significant change in his breathing. This is not surprising, given that his PFTs were really unimpressive. He has significant underlying cardiac disease with cardiomyopathy, and was recently in the hospital for a percutaneous intervention. He is also obese and has deconditioning.  He has had a recent d-dimer that was unimpressive. At this point, I really don't see a lot from a pulmonary standpoint to improve his breathing. I have encouraged him to work aggressively on weight loss, as well as conditioning. I would be happy to see him back in the future if new issues develop.

## 2014-06-20 DIAGNOSIS — M4712 Other spondylosis with myelopathy, cervical region: Secondary | ICD-10-CM | POA: Diagnosis not present

## 2014-06-20 DIAGNOSIS — G894 Chronic pain syndrome: Secondary | ICD-10-CM | POA: Diagnosis not present

## 2014-06-20 DIAGNOSIS — Z79899 Other long term (current) drug therapy: Secondary | ICD-10-CM | POA: Diagnosis not present

## 2014-06-20 DIAGNOSIS — Z79891 Long term (current) use of opiate analgesic: Secondary | ICD-10-CM | POA: Diagnosis not present

## 2014-07-05 ENCOUNTER — Encounter: Payer: Self-pay | Admitting: Internal Medicine

## 2014-07-05 ENCOUNTER — Ambulatory Visit (INDEPENDENT_AMBULATORY_CARE_PROVIDER_SITE_OTHER): Payer: Medicare Other | Admitting: Internal Medicine

## 2014-07-05 VITALS — BP 130/66 | HR 73 | Ht 69.0 in | Wt 227.0 lb

## 2014-07-05 DIAGNOSIS — I10 Essential (primary) hypertension: Secondary | ICD-10-CM | POA: Diagnosis not present

## 2014-07-05 DIAGNOSIS — I4901 Ventricular fibrillation: Secondary | ICD-10-CM | POA: Diagnosis not present

## 2014-07-05 DIAGNOSIS — I5022 Chronic systolic (congestive) heart failure: Secondary | ICD-10-CM

## 2014-07-05 DIAGNOSIS — Z95810 Presence of automatic (implantable) cardiac defibrillator: Secondary | ICD-10-CM

## 2014-07-05 DIAGNOSIS — I255 Ischemic cardiomyopathy: Secondary | ICD-10-CM

## 2014-07-05 LAB — MDC_IDC_ENUM_SESS_TYPE_INCLINIC
Battery Remaining Longevity: 76.8 mo
Brady Statistic RA Percent Paced: 0 %
Brady Statistic RV Percent Paced: 1.1 %
Date Time Interrogation Session: 20160304104554
HighPow Impedance: 36 Ohm
HighPow Impedance: 36.0879
Implantable Pulse Generator Serial Number: 7041246
Lead Channel Impedance Value: 300 Ohm
Lead Channel Impedance Value: 400 Ohm
Lead Channel Pacing Threshold Amplitude: 0.75 V
Lead Channel Pacing Threshold Amplitude: 0.75 V
Lead Channel Pacing Threshold Amplitude: 0.75 V
Lead Channel Pacing Threshold Amplitude: 0.75 V
Lead Channel Pacing Threshold Pulse Width: 0.5 ms
Lead Channel Pacing Threshold Pulse Width: 0.5 ms
Lead Channel Pacing Threshold Pulse Width: 0.5 ms
Lead Channel Pacing Threshold Pulse Width: 0.5 ms
Lead Channel Sensing Intrinsic Amplitude: 2.9 mV
Lead Channel Sensing Intrinsic Amplitude: 9.5 mV
Lead Channel Setting Pacing Amplitude: 2 V
Lead Channel Setting Pacing Amplitude: 2.5 V
Lead Channel Setting Pacing Pulse Width: 0.5 ms
Lead Channel Setting Sensing Sensitivity: 0.5 mV
Zone Setting Detection Interval: 280 ms
Zone Setting Detection Interval: 320 ms

## 2014-07-05 LAB — PACEMAKER DEVICE OBSERVATION
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
## 2014-07-05 NOTE — Assessment & Plan Note (Signed)
His St. [name] ICD is working normally. Will recheck in several months.  

## 2014-07-05 NOTE — Assessment & Plan Note (Signed)
HIs chronic systolic heart failure is class 2. He is trying to eat less salt. He will continue his current meds.

## 2014-07-05 NOTE — Patient Instructions (Signed)
Your physician recommends that you schedule a follow-up appointment in: 1 year with Dr. Lovena Le  Remote monitoring is used to monitor your Pacemaker of ICD from home. This monitoring reduces the number of office visits required to check your device to one time per year. It allows [place] to keep an eye on the functioning of your device to ensure it is working properly. You are scheduled for a device check from home on June 6. You may send your transmission at any time that day. If you have a wireless device, the transmission will be sent automatically. After your physician reviews your transmission, you will receive a postcard with your next transmission date.   Your physician recommends that you continue on your current medications as directed. Please refer to the Current Medication list given to you today.  Thank you for choosing [facility]!

## 2014-07-05 NOTE — Assessment & Plan Note (Signed)
His systolic blood pressure is well controlled. Will follow.

## 2014-07-08 ENCOUNTER — Encounter: Payer: Self-pay | Admitting: Internal Medicine

## 2014-07-10 ENCOUNTER — Other Ambulatory Visit: Payer: Self-pay | Admitting: Family Medicine

## 2014-07-10 ENCOUNTER — Encounter: Payer: Self-pay | Admitting: Family Medicine

## 2014-07-10 ENCOUNTER — Ambulatory Visit (INDEPENDENT_AMBULATORY_CARE_PROVIDER_SITE_OTHER): Payer: Medicare Other | Admitting: Family Medicine

## 2014-07-10 VITALS — BP 142/98 | Ht 69.0 in | Wt 226.0 lb

## 2014-07-10 DIAGNOSIS — J3089 Other allergic rhinitis: Secondary | ICD-10-CM

## 2014-07-10 DIAGNOSIS — I255 Ischemic cardiomyopathy: Secondary | ICD-10-CM | POA: Diagnosis not present

## 2014-07-10 MED ORDER — FLUTICASONE PROPIONATE 50 MCG/ACT NA SUSP: 2.0000 | Freq: Every day | NASAL | Status: DC

## 2014-07-10 NOTE — Patient Instructions (Signed)
Use store brand of Allegra (cheapest at [facility] - fexofenadine)  Add Flonase - 2 sprays each nostril daily  If that does not help over the next 2 weeks then cal and we will add a steroid inhaler as well   I believe you are having inflammation in the sinuses  If not getting a lot better with just the nasal inhaler and allergy tablet then a steroid lung inhaler will help with chronic bronchial inflammation  Call us if ongoing

## 2014-07-10 NOTE — Progress Notes (Signed)
   Subjective:    Patient ID: Lance [name], male    DOB: 03-Nov-1945, 69 y.o.   MRN: [id number]  Cough This is a recurrent problem. The problem has been gradually worsening. The problem occurs every few minutes. The cough is productive of sputum. Associated symptoms include nasal congestion, postnasal drip, rhinorrhea and shortness of breath. Pertinent negatives include no chest pain, ear pain, fever or wheezing. Nothing aggravates the symptoms. Treatments tried: went to the pulmonologist. The treatment provided mild relief. His past medical history is significant for COPD.      Review of Systems  Constitutional: Negative for fever and activity change.  HENT: Positive for postnasal drip and rhinorrhea. Negative for congestion and ear pain.   Eyes: Negative for discharge.  Respiratory: Positive for cough and shortness of breath. Negative for wheezing.   Cardiovascular: Negative for chest pain.       Objective:   Physical Exam  Constitutional: He appears well-developed.  HENT:  Head: Normocephalic.  Mouth/Throat: Oropharynx is clear and moist. No oropharyngeal exudate.  Neck: Normal range of motion.  Cardiovascular: Normal rate, regular rhythm and normal heart sounds.   No murmur heard. Pulmonary/Chest: Effort normal and breath sounds normal. He has no wheezes.  Lymphadenopathy:    He has no cervical adenopathy.  Neurological: He exhibits normal muscle tone.  Skin: Skin is warm and dry.  Nursing note and vitals reviewed.         Assessment & Plan:  Moderate cough I believe that this is related to upper respiratory illness allergic rhinitis and postnasal drainage. Use Flonase on a regular basis along with Allegra. If not seen significant improvement with this over the course of next few weeks consider adding a steroid inhaler. Patient is to give [place] follow-up regarding this.

## 2014-07-24 DIAGNOSIS — E11359 Type 2 diabetes mellitus with proliferative diabetic retinopathy without macular edema: Secondary | ICD-10-CM | POA: Diagnosis not present

## 2014-07-24 DIAGNOSIS — H3582 Retinal ischemia: Secondary | ICD-10-CM | POA: Diagnosis not present

## 2014-07-29 ENCOUNTER — Ambulatory Visit: Payer: Medicare Other | Admitting: Family Medicine

## 2014-07-31 ENCOUNTER — Other Ambulatory Visit: Payer: Self-pay | Admitting: Family Medicine

## 2014-08-06 DIAGNOSIS — Z713 Dietary counseling and surveillance: Secondary | ICD-10-CM | POA: Diagnosis not present

## 2014-08-06 DIAGNOSIS — I10 Essential (primary) hypertension: Secondary | ICD-10-CM | POA: Diagnosis not present

## 2014-08-06 DIAGNOSIS — B351 Tinea unguium: Secondary | ICD-10-CM | POA: Diagnosis not present

## 2014-08-06 DIAGNOSIS — E1165 Type 2 diabetes mellitus with hyperglycemia: Secondary | ICD-10-CM | POA: Diagnosis not present

## 2014-08-06 DIAGNOSIS — E1142 Type 2 diabetes mellitus with diabetic polyneuropathy: Secondary | ICD-10-CM | POA: Diagnosis not present

## 2014-08-06 DIAGNOSIS — E782 Mixed hyperlipidemia: Secondary | ICD-10-CM | POA: Diagnosis not present

## 2014-08-09 ENCOUNTER — Ambulatory Visit: Payer: Medicare Other | Admitting: Cardiology

## 2014-08-13 DIAGNOSIS — E782 Mixed hyperlipidemia: Secondary | ICD-10-CM | POA: Diagnosis not present

## 2014-08-13 DIAGNOSIS — E1165 Type 2 diabetes mellitus with hyperglycemia: Secondary | ICD-10-CM | POA: Diagnosis not present

## 2014-08-13 DIAGNOSIS — E559 Vitamin D deficiency, unspecified: Secondary | ICD-10-CM | POA: Diagnosis not present

## 2014-08-13 DIAGNOSIS — I10 Essential (primary) hypertension: Secondary | ICD-10-CM | POA: Diagnosis not present

## 2014-08-21 ENCOUNTER — Other Ambulatory Visit: Payer: Self-pay | Admitting: Cardiology

## 2014-08-23 ENCOUNTER — Telehealth: Payer: Self-pay | Admitting: Family Medicine

## 2014-08-23 DIAGNOSIS — R5383 Other fatigue: Secondary | ICD-10-CM

## 2014-08-23 DIAGNOSIS — Z125 Encounter for screening for malignant neoplasm of prostate: Secondary | ICD-10-CM

## 2014-08-23 DIAGNOSIS — Z79899 Other long term (current) drug therapy: Secondary | ICD-10-CM

## 2014-08-23 DIAGNOSIS — D649 Anemia, unspecified: Secondary | ICD-10-CM | POA: Diagnosis not present

## 2014-08-23 DIAGNOSIS — M791 Myalgia, unspecified site: Secondary | ICD-10-CM

## 2014-08-23 NOTE — Telephone Encounter (Signed)
DONE

## 2014-08-23 NOTE — Telephone Encounter (Signed)
Pt with fatigue for 1 month, will do labs today and ov Mon or Tues. plz order CK, sed rate, CBC,Ferritin,Met 7,liver, PSA,TSH,sed rate - rea: fatigue,anemia iron deficient,screening PSA, high risk meds, muscle pain

## 2014-08-23 NOTE — Addendum Note (Signed)
Addended by: Jesusita Oka on: 08/23/2014 10:03 AM   Modules accepted: Orders

## 2014-08-24 LAB — HEPATIC FUNCTION PANEL
ALT: 30 IU/L (ref 0–44)
AST: 19 IU/L (ref 0–40)
Albumin: 4.3 g/dL (ref 3.6–4.8)
Alkaline Phosphatase: 94 IU/L (ref 39–117)
Bilirubin Total: 0.3 mg/dL (ref 0.0–1.2)
Bilirubin, Direct: 0.1 mg/dL (ref 0.00–0.40)
Total Protein: 6.9 g/dL (ref 6.0–8.5)

## 2014-08-24 LAB — CBC WITH DIFFERENTIAL/PLATELET
Basophils Absolute: 0 10*3/uL (ref 0.0–0.2)
Basos: 1 %
EOS (ABSOLUTE): 0.1 10*3/uL (ref 0.0–0.4)
Eos: 1 %
Hematocrit: 39.5 % (ref 37.5–51.0)
Hemoglobin: 13.2 g/dL (ref 12.6–17.7)
Immature Grans (Abs): 0 10*3/uL (ref 0.0–0.1)
Immature Granulocytes: 0 %
Lymphocytes Absolute: 2.5 10*3/uL (ref 0.7–3.1)
Lymphs: 40 %
MCH: 28.7 pg (ref 26.6–33.0)
MCHC: 33.4 g/dL (ref 31.5–35.7)
MCV: 86 fL (ref 79–97)
Monocytes Absolute: 0.5 10*3/uL (ref 0.1–0.9)
Monocytes: 8 %
Neutrophils Absolute: 3.1 10*3/uL (ref 1.4–7.0)
Neutrophils: 50 %
Platelets: 156 10*3/uL (ref 150–379)
RBC: 4.6 x10E6/uL (ref 4.14–5.80)
RDW: 14.7 % (ref 12.3–15.4)
WBC: 6.2 10*3/uL (ref 3.4–10.8)

## 2014-08-24 LAB — BASIC METABOLIC PANEL
BUN/Creatinine Ratio: 15 (ref 10–22)
BUN: 14 mg/dL (ref 8–27)
CO2: 22 mmol/L (ref 18–29)
Calcium: 9.3 mg/dL (ref 8.6–10.2)
Chloride: 97 mmol/L (ref 97–108)
Creatinine, Ser: 0.93 mg/dL (ref 0.76–1.27)
GFR calc Af Amer: 99 mL/min/{1.73_m2} (ref >59)
GFR calc non Af Amer: 85 mL/min/{1.73_m2} (ref >59)
Glucose: 219 mg/dL — ABNORMAL HIGH (ref 65–99)
Potassium: 3.9 mmol/L (ref 3.5–5.2)
Sodium: 138 mmol/L (ref 134–144)

## 2014-08-24 LAB — FERRITIN: Ferritin: 76 ng/mL (ref 30–400)

## 2014-08-24 LAB — PSA: PSA: 1.7 ng/mL (ref 0.0–4.0)

## 2014-08-24 LAB — SEDIMENTATION RATE: Sed Rate: 27 mm/hr (ref 0–30)

## 2014-08-24 LAB — TSH: TSH: 3.25 u[IU]/mL (ref 0.450–4.500)

## 2014-08-24 LAB — CK: Total CK: 139 U/L (ref 24–204)

## 2014-08-26 ENCOUNTER — Ambulatory Visit (INDEPENDENT_AMBULATORY_CARE_PROVIDER_SITE_OTHER): Payer: Medicare Other | Admitting: Family Medicine

## 2014-08-26 ENCOUNTER — Encounter: Payer: Self-pay | Admitting: Family Medicine

## 2014-08-26 VITALS — BP 128/90 | Ht 69.0 in | Wt 230.0 lb

## 2014-08-26 DIAGNOSIS — R5383 Other fatigue: Secondary | ICD-10-CM

## 2014-08-26 DIAGNOSIS — I255 Ischemic cardiomyopathy: Secondary | ICD-10-CM

## 2014-08-26 DIAGNOSIS — I951 Orthostatic hypotension: Secondary | ICD-10-CM

## 2014-08-26 NOTE — Progress Notes (Signed)
   Subjective:    Patient ID: Lance [name], male    DOB: 1945-10-24, 69 y.o.   MRN: [id number]  HPI  Patient here due to feeling tired time several weeks. Had lab work on 08/23/14. Patient relates he's taken his medicines as directed. Recently he stopped taking Byetta because of the cost His blood sugars been running in the upper 200s and 300s Endocrinologist working with him with insulin Patient does try to eat healthy He denies fevers chills sweats relates he has fatigue tiredness some dizziness when he stands up just doesn't feel as good as it used to feel. Feels run down a lot denies excessive sleepiness denies sleep apnea symptoms Denies being depressed Relates frequent urination and thirst  Review of Systems  Constitutional: Negative for activity change, appetite change and fatigue.  HENT: Negative for congestion.   Respiratory: Negative for cough.   Cardiovascular: Negative for chest pain.  Gastrointestinal: Negative for abdominal pain.  Endocrine: Positive for polydipsia and polyuria. Negative for polyphagia.  Neurological: Negative for weakness.  Psychiatric/Behavioral: Negative for confusion.       Objective:   Physical Exam  Constitutional: He appears well-nourished. No distress.  Cardiovascular: Normal rate, regular rhythm and normal heart sounds.   No murmur heard. Pulmonary/Chest: Effort normal and breath sounds normal. No respiratory distress.  Musculoskeletal: He exhibits no edema.  Lymphadenopathy:    He has no cervical adenopathy.  Neurological: He is alert.  Psychiatric: His behavior is normal.  Vitals reviewed.   Lab work shows CBC looks normal there is no anemia thyroid testing was normal PSAs normal liver looks normal sedimentation rate normal muscle enzyme test looks good patient not her physician and kidney function sodium potassium looks good  Orthostatic blood pressures were taken laying sitting standing significant drop which is consistent with  orthostasis possibly related to polyuria    Assessment & Plan:  Significant fatigue tiredness extensive questioning done detailed physical examination completed no obvious sign of any underlying problem except follows  Orthostatic hypertension the importance of reducing amlodipine. Was on 10 mg he will do a half a tablet daily follow-up 3 weeks.  Severe diabetes issues discuss with the patient today that he needs to call his endocrinologist and get his insulin adjusted get his sugars down in the 100 range I believe this would help him feel better.  I find no evidence of cancer or underlying disease follow-up again 3 weeks time. 25 minutes spent patient.

## 2014-08-29 ENCOUNTER — Telehealth: Payer: Self-pay | Admitting: *Deleted

## 2014-08-29 NOTE — Telephone Encounter (Signed)
Discussed with pt's wife that his labs were ok and to keep follow up office visit per Dr.Scott.

## 2014-09-02 ENCOUNTER — Encounter: Payer: Self-pay | Admitting: Family Medicine

## 2014-09-02 DIAGNOSIS — H43813 Vitreous degeneration, bilateral: Secondary | ICD-10-CM | POA: Diagnosis not present

## 2014-09-02 DIAGNOSIS — E11359 Type 2 diabetes mellitus with proliferative diabetic retinopathy without macular edema: Secondary | ICD-10-CM | POA: Diagnosis not present

## 2014-09-02 DIAGNOSIS — H43822 Vitreomacular adhesion, left eye: Secondary | ICD-10-CM | POA: Diagnosis not present

## 2014-09-02 LAB — HM DIABETES EYE EXAM

## 2014-09-06 ENCOUNTER — Encounter: Payer: Self-pay | Admitting: Neurology

## 2014-09-06 ENCOUNTER — Ambulatory Visit (INDEPENDENT_AMBULATORY_CARE_PROVIDER_SITE_OTHER): Payer: Medicare Other | Admitting: Neurology

## 2014-09-06 VITALS — BP 153/87 | HR 74 | Ht 69.0 in | Wt 232.4 lb

## 2014-09-06 DIAGNOSIS — G629 Polyneuropathy, unspecified: Secondary | ICD-10-CM

## 2014-09-06 DIAGNOSIS — E1342 Other specified diabetes mellitus with diabetic polyneuropathy: Secondary | ICD-10-CM

## 2014-09-06 DIAGNOSIS — R413 Other amnesia: Secondary | ICD-10-CM

## 2014-09-06 DIAGNOSIS — I255 Ischemic cardiomyopathy: Secondary | ICD-10-CM

## 2014-09-06 DIAGNOSIS — E1142 Type 2 diabetes mellitus with diabetic polyneuropathy: Secondary | ICD-10-CM

## 2014-09-06 HISTORY — DX: Type 2 diabetes mellitus with diabetic polyneuropathy: E11.42

## 2014-09-06 MED ORDER — DONEPEZIL HCL 10 MG PO TABS: 10.0000 mg | ORAL_TABLET | Freq: Every day | ORAL | Status: DC
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
## 2014-09-06 NOTE — Patient Instructions (Signed)

## 2014-09-09 ENCOUNTER — Encounter: Payer: Self-pay | Admitting: Cardiology

## 2014-09-09 ENCOUNTER — Ambulatory Visit (INDEPENDENT_AMBULATORY_CARE_PROVIDER_SITE_OTHER): Payer: Medicare Other | Admitting: Cardiology

## 2014-09-09 VITALS — BP 124/70 | HR 67 | Wt 228.0 lb

## 2014-09-09 DIAGNOSIS — I255 Ischemic cardiomyopathy: Secondary | ICD-10-CM | POA: Diagnosis not present

## 2014-09-09 DIAGNOSIS — I5022 Chronic systolic (congestive) heart failure: Secondary | ICD-10-CM

## 2014-09-09 DIAGNOSIS — I25709 Atherosclerosis of coronary artery bypass graft(s), unspecified, with unspecified angina pectoris: Secondary | ICD-10-CM | POA: Diagnosis not present

## 2014-09-09 DIAGNOSIS — E785 Hyperlipidemia, unspecified: Secondary | ICD-10-CM

## 2014-09-09 MED ORDER — FUROSEMIDE 20 MG PO TABS: ORAL_TABLET | ORAL | Status: DC

## 2014-09-09 NOTE — Patient Instructions (Signed)
Your physician recommends that you schedule a follow-up appointment in:  2 months with Dr Bryna Colander make take Lasix 20 mg daily if needed for shortness of breath or leg swelling    Thank you for choosing [facility] !
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
## 2014-09-13 DIAGNOSIS — E1342 Other specified diabetes mellitus with diabetic polyneuropathy: Secondary | ICD-10-CM | POA: Diagnosis not present

## 2014-09-16 ENCOUNTER — Ambulatory Visit (INDEPENDENT_AMBULATORY_CARE_PROVIDER_SITE_OTHER): Payer: Medicare Other | Admitting: Family Medicine

## 2014-09-16 ENCOUNTER — Encounter: Payer: Self-pay | Admitting: Family Medicine

## 2014-09-16 ENCOUNTER — Encounter: Payer: Self-pay | Admitting: Internal Medicine

## 2014-09-16 VITALS — BP 138/86 | Ht 69.0 in | Wt 227.5 lb

## 2014-09-16 DIAGNOSIS — S61219A Laceration without foreign body of unspecified finger without damage to nail, initial encounter: Secondary | ICD-10-CM | POA: Diagnosis not present

## 2014-09-16 DIAGNOSIS — W270XXA Contact with workbench tool, initial encounter: Secondary | ICD-10-CM | POA: Diagnosis not present

## 2014-09-16 DIAGNOSIS — R739 Hyperglycemia, unspecified: Secondary | ICD-10-CM

## 2014-09-16 DIAGNOSIS — I10 Essential (primary) hypertension: Secondary | ICD-10-CM | POA: Diagnosis not present

## 2014-09-16 DIAGNOSIS — I255 Ischemic cardiomyopathy: Secondary | ICD-10-CM | POA: Diagnosis not present

## 2014-09-16 DIAGNOSIS — Z23 Encounter for immunization: Secondary | ICD-10-CM | POA: Diagnosis not present

## 2014-09-16 MED ORDER — DOXYCYCLINE HYCLATE 100 MG PO CAPS: 100.0000 mg | ORAL_CAPSULE | Freq: Two times a day (BID) | ORAL | Status: DC

## 2014-09-16 MED ORDER — AMLODIPINE BESYLATE 5 MG PO TABS: 5.0000 mg | ORAL_TABLET | Freq: Every day | ORAL | Status: DC

## 2014-09-16 NOTE — Progress Notes (Addendum)
   Subjective:    Patient ID: Lance [name], male    DOB: [birth year], 69 y.o.   MRN: [id number]  Hypertension This is a chronic problem. The current episode started more than 1 year ago. The problem has been gradually improving since onset. Pertinent negatives include no chest pain. There are no associated agents to hypertension. There are no known risk factors for coronary artery disease. Treatments tried: Amlodipine. There are no compliance problems.    Pt with recent low BP Amlodipine was changed to 5 mg now doing better   Review of Systems  Constitutional: Negative for activity change, appetite change and fatigue.  HENT: Negative for congestion.   Respiratory: Negative for cough.   Cardiovascular: Negative for chest pain.  Gastrointestinal: Negative for abdominal pain.  Endocrine: Negative for polydipsia and polyphagia.  Neurological: Negative for weakness.  Psychiatric/Behavioral: Negative for confusion.       Objective:   Physical Exam  Constitutional: He appears well-nourished. No distress.  Cardiovascular: Normal rate, regular rhythm and normal heart sounds.   No murmur heard. Pulmonary/Chest: Effort normal and breath sounds normal. No respiratory distress.  Musculoskeletal: He exhibits no edema.  Lymphadenopathy:    He has no cervical adenopathy.  Neurological: He is alert.  Psychiatric: His behavior is normal.  Vitals reviewed.         Assessment & Plan:  Hypertension Blood pressure doing much better on current dosing we will change his medication in the electronic records 5 mg Norvasc 1 daily. Blood pressure laying sitting standing good. Approximately 120/70 range both laying sitting and standing no personal drop patient states he feels better continue as is follow-up in the fall time.  Patient still having difficult time with diabetes sugars are significantly elevated he states he did drop off those readings in his diabetic doctor he is waiting to hear back.  He is aware of proper diet  It should also be noted that this patient sustained a laceration to his left thumb today. 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 He also needs to have this stitched. This area was cleansed. Digital block of his thumb was completed. 5 sutures were placed without difficulty. Wound precautions as well as what to watch for was discussed. Importance of following up within the next 10-14 days for suture removal was also discussed. The laceration was approximately 3.2 cm long.

## 2014-09-16 NOTE — Patient Instructions (Signed)
Blood pressure much better Keep at 5 mg on the amlodipine I will change your prescription at [facility]

## 2014-09-16 NOTE — Addendum Note (Signed)
Addended by: Sallee Lange A on: 09/16/2014 02:34 PM   Modules accepted: Orders

## 2014-09-16 NOTE — Addendum Note (Signed)
Addended by: Carmelina Noun on: 09/16/2014 02:40 PM   Modules accepted: Orders

## 2014-09-16 NOTE — Addendum Note (Signed)
Addended by: Carmelina Noun on: 09/16/2014 02:43 PM   Modules accepted: Level of Service

## 2014-09-18 DIAGNOSIS — E11359 Type 2 diabetes mellitus with proliferative diabetic retinopathy without macular edema: Secondary | ICD-10-CM | POA: Diagnosis not present

## 2014-09-19 ENCOUNTER — Encounter: Payer: Self-pay | Admitting: *Deleted

## 2014-09-19 ENCOUNTER — Other Ambulatory Visit: Payer: Self-pay | Admitting: Family Medicine

## 2014-09-22 NOTE — Addendum Note (Signed)
Addended by: Sallee Lange A on: 09/22/2014 08:40 AM   Modules accepted: Level of Service

## 2014-09-25 DIAGNOSIS — M545 Low back pain: Secondary | ICD-10-CM | POA: Diagnosis not present

## 2014-09-25 DIAGNOSIS — M5137 Other intervertebral disc degeneration, lumbosacral region: Secondary | ICD-10-CM | POA: Diagnosis not present

## 2014-09-25 DIAGNOSIS — M4806 Spinal stenosis, lumbar region: Secondary | ICD-10-CM | POA: Diagnosis not present

## 2014-09-27 ENCOUNTER — Ambulatory Visit (INDEPENDENT_AMBULATORY_CARE_PROVIDER_SITE_OTHER): Payer: Medicare Other | Admitting: Family Medicine

## 2014-09-27 ENCOUNTER — Encounter: Payer: Self-pay | Admitting: Family Medicine

## 2014-09-27 VITALS — BP 128/82 | Ht 69.0 in | Wt 227.0 lb

## 2014-09-27 DIAGNOSIS — L03012 Cellulitis of left finger: Secondary | ICD-10-CM

## 2014-09-27 DIAGNOSIS — S61019D Laceration without foreign body of unspecified thumb without damage to nail, subsequent encounter: Secondary | ICD-10-CM

## 2014-09-27 DIAGNOSIS — I255 Ischemic cardiomyopathy: Secondary | ICD-10-CM

## 2014-09-27 MED ORDER — CLINDAMYCIN HCL 300 MG PO CAPS: 300.0000 mg | ORAL_CAPSULE | Freq: Three times a day (TID) | ORAL | Status: DC

## 2014-09-27 NOTE — Progress Notes (Signed)
   Subjective:    Patient ID: Lance [name], male    DOB: February 11, 1946, 69 y.o.   MRN: [id number]  HPI  Patient arrives to have sutures removed from thumb. Patient relates some increased pressure in swelling recently states even some drainage of pus denies severe pain denies fever does have diabetes Review of Systems    see above Objective:   Physical Exam The thumb does have some swelling and some evidence of infection area was pressed upon and a small amount of pus was obtained it was not felt that the patient needed any type of incision. Culture was sent. Sutures removed without difficulty.       Assessment & Plan:  *Antibodies Culture sent Follow-up in one week If fevers severe pain or worse notify us Patient sugars have been elevated he was instructed to talk with his diabetologist to help adjust his insulin

## 2014-09-29 LAB — WOUND CULTURE: Organism ID, Bacteria: NONE SEEN

## 2014-10-03 ENCOUNTER — Other Ambulatory Visit: Payer: Self-pay | Admitting: *Deleted

## 2014-10-03 MED ORDER — SULFAMETHOXAZOLE-TRIMETHOPRIM 800-160 MG PO TABS: 1.0000 | ORAL_TABLET | Freq: Two times a day (BID) | ORAL | Status: DC

## 2014-10-04 ENCOUNTER — Ambulatory Visit (INDEPENDENT_AMBULATORY_CARE_PROVIDER_SITE_OTHER): Payer: Medicare Other | Admitting: Family Medicine

## 2014-10-04 ENCOUNTER — Encounter: Payer: Self-pay | Admitting: Family Medicine

## 2014-10-04 VITALS — BP 132/70 | Temp 98.8°F | Ht 69.0 in | Wt 225.0 lb

## 2014-10-04 DIAGNOSIS — L03012 Cellulitis of left finger: Secondary | ICD-10-CM

## 2014-10-04 DIAGNOSIS — I255 Ischemic cardiomyopathy: Secondary | ICD-10-CM

## 2014-10-04 NOTE — Progress Notes (Signed)
   Subjective:    Patient ID: Lance [name], male    DOB: [birth year], 69 y.o.   MRN: [id number]  HPIRecheck finger. Pt states finger is doing much better. Pt has not started taking bactrim yet because she has to do work outside. He states he can start it on Tuesday.     Review of Systems     Objective:   Physical Exam  The thumb appears much better appears to be healing well no sign of infection.      Assessment & Plan:  Thumb healing well no sign of infection no need for any intervention patient may start antibiotically if symptoms of infection occur

## 2014-10-05 ENCOUNTER — Encounter (HOSPITAL_COMMUNITY): Payer: Self-pay | Admitting: Emergency Medicine

## 2014-10-05 ENCOUNTER — Emergency Department (HOSPITAL_COMMUNITY): Payer: Medicare Other

## 2014-10-05 ENCOUNTER — Emergency Department (HOSPITAL_COMMUNITY)
Admission: EM | Admit: 2014-10-05 | Discharge: 2014-10-05 | Disposition: A | Discharge status: Home / Self Care | Payer: Medicare Other | Attending: Emergency Medicine | Admitting: Emergency Medicine

## 2014-10-05 DIAGNOSIS — Z7902 Long term (current) use of antithrombotics/antiplatelets: Secondary | ICD-10-CM | POA: Insufficient documentation

## 2014-10-05 DIAGNOSIS — I509 Heart failure, unspecified: Secondary | ICD-10-CM | POA: Insufficient documentation

## 2014-10-05 DIAGNOSIS — Z79899 Other long term (current) drug therapy: Secondary | ICD-10-CM | POA: Insufficient documentation

## 2014-10-05 DIAGNOSIS — R0602 Shortness of breath: Secondary | ICD-10-CM | POA: Diagnosis not present

## 2014-10-05 DIAGNOSIS — K219 Gastro-esophageal reflux disease without esophagitis: Secondary | ICD-10-CM | POA: Diagnosis not present

## 2014-10-05 DIAGNOSIS — W57XXXA Bitten or stung by nonvenomous insect and other nonvenomous arthropods, initial encounter: Secondary | ICD-10-CM | POA: Insufficient documentation

## 2014-10-05 DIAGNOSIS — Y9389 Activity, other specified: Secondary | ICD-10-CM | POA: Insufficient documentation

## 2014-10-05 DIAGNOSIS — I10 Essential (primary) hypertension: Secondary | ICD-10-CM | POA: Diagnosis not present

## 2014-10-05 DIAGNOSIS — Z91040 Latex allergy status: Secondary | ICD-10-CM | POA: Insufficient documentation

## 2014-10-05 DIAGNOSIS — Y9289 Other specified places as the place of occurrence of the external cause: Secondary | ICD-10-CM | POA: Insufficient documentation

## 2014-10-05 DIAGNOSIS — J441 Chronic obstructive pulmonary disease with (acute) exacerbation: Secondary | ICD-10-CM | POA: Insufficient documentation

## 2014-10-05 DIAGNOSIS — H9190 Unspecified hearing loss, unspecified ear: Secondary | ICD-10-CM | POA: Insufficient documentation

## 2014-10-05 DIAGNOSIS — Z880 Allergy status to penicillin: Secondary | ICD-10-CM | POA: Insufficient documentation

## 2014-10-05 DIAGNOSIS — E785 Hyperlipidemia, unspecified: Secondary | ICD-10-CM | POA: Insufficient documentation

## 2014-10-05 DIAGNOSIS — I2510 Atherosclerotic heart disease of native coronary artery without angina pectoris: Secondary | ICD-10-CM | POA: Insufficient documentation

## 2014-10-05 DIAGNOSIS — E1140 Type 2 diabetes mellitus with diabetic neuropathy, unspecified: Secondary | ICD-10-CM | POA: Insufficient documentation

## 2014-10-05 DIAGNOSIS — Z950 Presence of cardiac pacemaker: Secondary | ICD-10-CM | POA: Diagnosis not present

## 2014-10-05 DIAGNOSIS — I252 Old myocardial infarction: Secondary | ICD-10-CM | POA: Diagnosis not present

## 2014-10-05 DIAGNOSIS — Z87448 Personal history of other diseases of urinary system: Secondary | ICD-10-CM | POA: Insufficient documentation

## 2014-10-05 DIAGNOSIS — Z794 Long term (current) use of insulin: Secondary | ICD-10-CM | POA: Diagnosis not present

## 2014-10-05 DIAGNOSIS — F419 Anxiety disorder, unspecified: Secondary | ICD-10-CM | POA: Diagnosis not present

## 2014-10-05 DIAGNOSIS — S70362A Insect bite (nonvenomous), left thigh, initial encounter: Secondary | ICD-10-CM | POA: Insufficient documentation

## 2014-10-05 DIAGNOSIS — Z7982 Long term (current) use of aspirin: Secondary | ICD-10-CM | POA: Insufficient documentation

## 2014-10-05 DIAGNOSIS — Y998 Other external cause status: Secondary | ICD-10-CM | POA: Insufficient documentation

## 2014-10-05 LAB — RAPID URINE DRUG SCREEN, HOSP PERFORMED
Amphetamines: NOT DETECTED
Barbiturates: NOT DETECTED
Benzodiazepines: NOT DETECTED
Cocaine: NOT DETECTED
Opiates: NOT DETECTED
Tetrahydrocannabinol: NOT DETECTED

## 2014-10-05 LAB — COMPREHENSIVE METABOLIC PANEL
ALT: 33 U/L (ref 17–63)
AST: 28 U/L (ref 15–41)
Albumin: 3.9 g/dL (ref 3.5–5.0)
Alkaline Phosphatase: 87 U/L (ref 38–126)
Anion gap: 11 (ref 5–15)
BUN: 9 mg/dL (ref 6–20)
CO2: 26 mmol/L (ref 22–32)
Calcium: 9.1 mg/dL (ref 8.9–10.3)
Chloride: 98 mmol/L — ABNORMAL LOW (ref 101–111)
Creatinine, Ser: 0.79 mg/dL (ref 0.61–1.24)
GFR calc Af Amer: >60 mL/min (ref >60)
GFR calc non Af Amer: >60 mL/min (ref >60)
Glucose, Bld: 278 mg/dL — ABNORMAL HIGH (ref 65–99)
Potassium: 4.1 mmol/L (ref 3.5–5.1)
Sodium: 135 mmol/L (ref 135–145)
Total Bilirubin: 0.6 mg/dL (ref 0.3–1.2)
Total Protein: 7.3 g/dL (ref 6.5–8.1)

## 2014-10-05 LAB — CBC WITH DIFFERENTIAL/PLATELET
Basophils Absolute: 0 10*3/uL (ref 0.0–0.1)
Basophils Relative: 0 % (ref 0–1)
Eosinophils Absolute: 0.1 10*3/uL (ref 0.0–0.7)
Eosinophils Relative: 1 % (ref 0–5)
HCT: 37.5 % — ABNORMAL LOW (ref 39.0–52.0)
Hemoglobin: 12.8 g/dL — ABNORMAL LOW (ref 13.0–17.0)
Lymphocytes Relative: 22 % (ref 12–46)
Lymphs Abs: 1.4 10*3/uL (ref 0.7–4.0)
MCH: 28.9 pg (ref 26.0–34.0)
MCHC: 34.1 g/dL (ref 30.0–36.0)
MCV: 84.7 fL (ref 78.0–100.0)
Monocytes Absolute: 0.5 10*3/uL (ref 0.1–1.0)
Monocytes Relative: 7 % (ref 3–12)
Neutro Abs: 4.5 10*3/uL (ref 1.7–7.7)
Neutrophils Relative %: 70 % (ref 43–77)
Platelets: 166 10*3/uL (ref 150–400)
RBC: 4.43 MIL/uL (ref 4.22–5.81)
RDW: 13.5 % (ref 11.5–15.5)
WBC: 6.4 10*3/uL (ref 4.0–10.5)

## 2014-10-05 LAB — URINALYSIS, ROUTINE W REFLEX MICROSCOPIC
Bilirubin Urine: NEGATIVE
Glucose, UA: >1000 mg/dL — AB
Hgb urine dipstick: NEGATIVE
Ketones, ur: NEGATIVE mg/dL
Leukocytes, UA: NEGATIVE
Nitrite: NEGATIVE
Protein, ur: NEGATIVE mg/dL
Specific Gravity, Urine: <1.005 — ABNORMAL LOW (ref 1.005–1.030)
Urobilinogen, UA: 0.2 mg/dL (ref 0.0–1.0)
pH: 5.5 (ref 5.0–8.0)

## 2014-10-05 LAB — TROPONIN I: Troponin I: <0.03 ng/mL (ref <0.031)

## 2014-10-05 LAB — URINE MICROSCOPIC-ADD ON

## 2014-10-05 LAB — TSH: TSH: 2.385 u[IU]/mL (ref 0.350–4.500)

## 2014-10-05 MED ORDER — DOXYCYCLINE HYCLATE 100 MG PO CAPS: 100.0000 mg | ORAL_CAPSULE | Freq: Two times a day (BID) | ORAL | Status: DC

## 2014-10-05 MED ORDER — LORAZEPAM 1 MG PO TABS
1.0000 mg | ORAL_TABLET | Freq: Once | ORAL | Status: AC
  Administered 2014-10-05: 1 mg via ORAL
  Filled 2014-10-05: qty 1

## 2014-10-05 NOTE — ED Notes (Signed)
Patient given discharge instruction, verbalized understand. Patient ambulatory out of the department.  

## 2014-10-05 NOTE — ED Provider Notes (Signed)
TIME SEEN: 5:00 AM  CHIEF COMPLAINT: "I'm feeling jumpy"  HPI: Pt is a 69 y.o. male with history of CAD, hypertension, hyperlipidemia, prior TIA, COPD who presents to the emergency department with feeling "jumpy" that started around 6:30 PM while eating dinner. Reports that he also had some shortness of breath feeling like he could not catch his breath. He also states he felt very hot and was diaphoretic. He did also have an episode where he urinated on himself. States that he has had these intermittent episodes of feeling very "jumpy" and hot throughout the evening. He adamantly denies that he has had any chest pain or chest discomfort. Does have a history of cardiac stents but states that none of these symptoms feel similar to that. He has had some nausea but no vomiting or diarrhea. He has had some lightheadedness. No fever. He currently has no complaints. States he did drink 2 [employer] earlier today. No other caffeine. No other stimulant use. He is also noted his blood pressure has been slightly elevated but did take his blood pressure medication yesterday. Also reports that he found a tick to his inner thigh on Wednesday, 2 days ago. No rash but does have erythema and swelling around this area. Denies any fever. No history of PE or DVT. Is currently taking Bactrim for a finger infection that is improving.  ROS: See HPI Constitutional: no fever  Eyes: no drainage  ENT: no runny nose   Cardiovascular:  no chest pain  Resp:  SOB  GI: no vomiting GU: no dysuria Integumentary: Erythema and swelling to the left inner thigh after tick bite Allergy: no hives  Musculoskeletal: no leg swelling  Neurological: no slurred speech ROS otherwise negative  PAST MEDICAL HISTORY/PAST SURGICAL HISTORY:  Past Medical History  Diagnosis Date  . ASCVD (arteriosclerotic cardiovascular disease)     70% mid left anterior descending lesion on cath in 06/1995; left anterior desending DES placed in 8/03 and RCA  stent in 9/03; captain 3/05 revealed 90% second marginal for which PCI was performed, 70% PDA and a total obstruction of the first diagonal and marginal; sudden cardiac death in [place] in November 11, 2003 for which automatic implantable cardiac defibrillator placed; negativ stress nuclear 10/07  . Hypertension   . Hyperlipidemia   . Tobacco abuse     100 pack/year comsuption; cigarettes discontinued 2003; all tobacco products in 2008  . CVD (cerebrovascular disease) 05/2008    Transient ischemic attack; carotid ultrasound-plaque without focal disease  . Degenerative joint disease 2002    C-spine fusion   . Erectile dysfunction   . Anxiety and depression   . Benign prostatic hypertrophy   . Other testicular hypofunction   . Esophageal reflux   . Coronary artery disease   . Allergic rhinitis, cause unspecified   . S/P endoscopy Dec 2011    RMR: nl esophagus, hyperplastic polyp, active gastritis, no H.pylori.   . ICD (implantable cardiac defibrillator) in place   . Pacemaker   . Hx-TIA (transient ischemic attack) 2010  . Tubular adenoma   . CAD (coronary artery disease) 1997  . COPD (chronic obstructive pulmonary disease)   . Myocardial infarction   . Shortness of breath   . Diabetes mellitus     Insulin requirement  . Diabetes mellitus without mention of complication   . CHF (congestive heart failure)   . Memory deficits 09/05/2013  . HOH (hard of hearing)   . C. difficile diarrhea   . Vitreous floaters of left  eye   . Diabetic peripheral neuropathy 09/06/2014    MEDICATIONS:  Prior to Admission medications   Medication Sig Start Date End Date Taking? Authorizing Provider  ALPRAZolam Duanne Moron) 1 MG tablet TAKE 1/2 TO 1 TABLET BY MOUTH TWICE DAILY AS NEEDED 12/27/13   Mikey Kirschner, MD  amLODipine (NORVASC) 5 MG tablet Take 1 tablet (5 mg total) by mouth daily. 09/16/14   [name], MD  aspirin 81 MG tablet Take 81 mg by mouth daily.    Historical Provider, MD  atorvastatin  (LIPITOR) 80 MG tablet Take 80 mg by mouth daily.  05/20/14   Historical Provider, MD  clopidogrel (PLAVIX) 75 MG tablet Take 1 tablet (75 mg total) by mouth daily with breakfast. 02/12/14   [name], MD  donepezil (ARICEPT) 10 MG tablet Take 1 tablet (10 mg total) by mouth at bedtime. 09/06/14   Kathrynn Ducking, MD  DULoxetine (CYMBALTA) 30 MG capsule TAKE ONE CAPSULE BY MOUTH DAILY 07/11/14   [name], MD  furosemide (LASIX) 20 MG tablet Make take 20 mg daily if needed for shortness of breath of leg swelling 09/09/14   Arnoldo Lenis, MD  gabapentin (NEURONTIN) 300 MG capsule Take 300 mg by mouth 2 (two) times daily.     Historical Provider, MD  Cleda Clarks 100 UNIT/ML KiwkPen  07/03/14   Historical Provider, MD  HYDROcodone-acetaminophen (NORCO) 7.5-325 MG per tablet Take 1 tablet by mouth every 6 (six) hours as needed for moderate pain.  12/04/12   Historical Provider, MD  insulin glargine (LANTUS SOLOSTAR) 100 UNIT/ML injection Inject 56 Units into the skin at bedtime.     Historical Provider, MD  LEVEMIR FLEXTOUCH 100 UNIT/ML Pen  07/03/14   Historical Provider, MD  losartan (COZAAR) 50 MG tablet TAKE ONE TABLET ONCE DAILY 07/31/14   [name], MD  metFORMIN (GLUCOPHAGE) 1000 MG tablet Take 1 tablet (1,000 mg total) by mouth 2 (two) times daily. 06/02/14   Erlene Quan, PA-C  metoprolol (LOPRESSOR) 50 MG tablet Take 50 mg by mouth 2 (two) times daily.    Historical Provider, MD  nitroGLYCERIN (NITROSTAT) 0.4 MG SL tablet Place 1 tablet (0.4 mg total) under the tongue every 5 (five) minutes as needed. For chest pain 05/27/14   Arnoldo Lenis, MD  NOVOLOG FLEXPEN 100 UNIT/ML injection Inject 17-21 Units into the skin 3 (three) times daily before meals. Uses according to sliding scale at home. , 201-250=17 units, 251-300=18 units, 301-350= 20 units. Anything over 400 take 21 units 05/18/11   Historical Provider, MD  ONE TOUCH ULTRA TEST test strip USE TO TEST BLOOD SUGAR FOUR TIMES  DAILY 10/09/13   [name], MD  pantoprazole (PROTONIX) 40 MG tablet TAKE ONE TABLET BY MOUTH TWICE A DAY 05/20/14   Orvil Feil, NP  Probiotic Product (ALIGN PO) Take 1 tablet by mouth daily.     Historical Provider, MD  sulfamethoxazole-trimethoprim (BACTRIM DS,SEPTRA DS) 800-160 MG per tablet Take 1 tablet by mouth 2 (two) times daily. Patient not taking: Reported on 10/04/2014 10/03/14   [name], MD  traZODone (DESYREL) 100 MG tablet TAKE ONE-HALF TO ONE TABLET AT BEDTIME AS NEEDED FOR SLEEP 03/26/14   [name], MD  traZODone (DESYREL) 100 MG tablet TAKE ONE-HALF TO ONE TABLET AT BEDTIME AS NEEDED FOR SLEEP 09/20/14   Mikey Kirschner, MD  vitamin B-12 (CYANOCOBALAMIN) 1000 MCG tablet Take 1,000 mcg by mouth daily.  Historical Provider, MD  Vitamin D, Ergocalciferol, (DRISDOL) 50000 UNITS CAPS Take 50,000 Units by mouth. Once a month    Historical Provider, MD    ALLERGIES:  Allergies  Allergen Reactions  . Penicillins Hives  . Latex   . Morphine And Related Other (See Comments)    hallucinations  . Other     Adhesive tape  . Percocet [Oxycodone-Acetaminophen] Other (See Comments)    hallucinations  . Levaquin [Levofloxacin In D5w]     itching    SOCIAL HISTORY:  History  Substance Use Topics  . Smoking status: Former Smoker -- 3.00 packs/day for 40 years    Types: Cigarettes    Start date: 05/03/1954    Quit date: 05/03/1998  . Smokeless tobacco: Current User    Types: Chew  . Alcohol Use: No    FAMILY HISTORY: Family History  Problem Relation Age of Onset  . Heart attack Other     Myocardial infarction  . Colon cancer Neg Hx   . Hypertension Father   . Heart attack Father   . Heart attack Brother   . Diabetes Mother   . Renal Disease Mother   . Renal Disease Sister     EXAM: BP 185/109 mmHg  Pulse 87  Temp(Src) 98.2 F (36.8 C) (Oral)  Resp 20  Ht 5\' 9"  (1.753 m)  Wt 225 lb (102.059 kg)  BMI 33.21 kg/m2  SpO2 97% CONSTITUTIONAL: Alert  and oriented and responds appropriately to questions. Well-appearing; well-nourished, elderly, in no distress, pleasant, smiling, nontoxic HEAD: Normocephalic EYES: Conjunctivae clear, PERRL ENT: normal nose; no rhinorrhea; moist mucous membranes; pharynx without lesions noted NECK: Supple, no meningismus, no LAD  CARD: RRR; S1 and S2 appreciated; no murmurs, no clicks, no rubs, no gallops RESP: Normal chest excursion without splinting or tachypnea; breath sounds clear and equal bilaterally; no wheezes, no rhonchi, no rales, no hypoxia or respiratory distress, speaking full sentences ABD/GI: Normal bowel sounds; non-distended; soft, non-tender, no rebound, no guarding, no peritoneal signs BACK:  The back appears normal and is non-tender to palpation, there is no CVA tenderness EXT: Normal ROM in all joints; non-tender to palpation; no edema; normal capillary refill; no cyanosis, no calf tenderness or swelling    SKIN: Normal color for age and race; warm, erythematous raised 4 x 5 cm area to the left inner thigh with no fluctuance or drainage NEURO: Moves all extremities equally, sensation to light touch intact diffusely, cranial nerves II through XII intact, normal gait PSYCH: The patient's mood and manner are appropriate. Grooming and personal hygiene are appropriate.  MEDICAL DECISION MAKING: Patient here with very atypical symptoms. He denies that this feels similar to when he had coronary artery disease and denies having chest pain. He has no current shortness of breath, nausea, dizziness or diaphoresis. These episodes began at 6:30 PM yesterday. States he feels "jumpy" and has been hypertensive. Here his blood pressure is 167/100. EKG shows no new ischemic changes, arrhythmia or interval changes. We'll obtain labs including troponin, chest x-ray. We'll also obtain TSH and urine drug screen. We'll give IV Ativan and reassess.  ED PROGRESS: Patient reports feeling much better after IV Ativan. His  blood pressure has now improved and is in the 120s/80s. His workup in the emergency department has been unremarkable including a negative troponin. I feel that if this was cardiac in nature that we would see a positive troponin at this point given his symptoms started at 6:30 PM. Chest x-ray is clear.  Urine drug screen is negative. I have advised him to stop taking his Bactrim and will start him on doxycycline to cover him for this tick bite as well as his finger infection that has resolved. Discussed strict return precautions and importance of outpatient follow-up. Unclear etiology for patient's symptoms tonight but they may have been related to anxiety, increased caffeine intake. He verbalizes understanding and is comfortable with this plan. His wife at bedside is also comfortable with this plan.   EKG Interpretation  Date/Time:  Saturday October 05 2014 05:10:44 EDT Ventricular Rate:  81 PR Interval:  196 QRS Duration: 101 QT Interval:  402 QTC Calculation: 467 R Axis:   -35 Text Interpretation:  Sinus rhythm Left axis deviation No significant change since last tracing other than rate is faster and 1st degree AV block resolved Confirmed by Toussaint Golson,  DO, Mikyle Sox ([id number]) on 10/05/2014 5:17:03 AM        [place], DO 10/05/14 [id number]

## 2014-10-05 NOTE — ED Notes (Signed)
Pt also has red area to inner left thigh from tick bite

## 2014-10-05 NOTE — ED Notes (Signed)
Pt state he has not felt well this evening, even voided on himself earlier today. Feels hot and shaky, anxious.

## 2014-10-05 NOTE — Discharge Instructions (Signed)
Please stop your Bactrim (sulfa antibiotic) and begin taking doxycycline which will cover for your finger infection as well as your tick bite.   Hypertension Hypertension, commonly called high blood pressure, is when the force of blood pumping through your arteries is too strong. Your arteries are the blood vessels that carry blood from your heart throughout your body. A blood pressure reading consists of a higher number over a lower number, such as 110/72. The higher number (systolic) is the pressure inside your arteries when your heart pumps. The lower number (diastolic) is the pressure inside your arteries when your heart relaxes. Ideally you want your blood pressure below 120/80. Hypertension forces your heart to work harder to pump blood. Your arteries may become narrow or stiff. Having hypertension puts you at risk for heart disease, stroke, and other problems.  RISK FACTORS Some risk factors for high blood pressure are controllable. Others are not.  Risk factors you cannot control include:   Race. You may be at higher risk if you are African American.  Age. Risk increases with age.  Gender. Men are at higher risk than women before age 59 years. After age 41, women are at higher risk than men. Risk factors you can control include:  Not getting enough exercise or physical activity.  Being overweight.  Getting too much fat, sugar, calories, or salt in your diet.  Drinking too much alcohol. SIGNS AND SYMPTOMS Hypertension does not usually cause signs or symptoms. Extremely high blood pressure (hypertensive crisis) may cause headache, anxiety, shortness of breath, and nosebleed. DIAGNOSIS  To check if you have hypertension, your health care provider will measure your blood pressure while you are seated, with your arm held at the level of your heart. It should be measured at least twice using the same arm. Certain conditions can cause a difference in blood pressure between your right and  left arms. A blood pressure reading that is higher than normal on one occasion does not mean that you need treatment. If one blood pressure reading is high, ask your health care provider about having it checked again. TREATMENT  Treating high blood pressure includes making lifestyle changes and possibly taking medicine. Living a healthy lifestyle can help lower high blood pressure. You may need to change some of your habits. Lifestyle changes may include: 1. Following the DASH diet. This diet is high in fruits, vegetables, and whole grains. It is low in salt, red meat, and added sugars. 2. Getting at least 2 hours of brisk physical activity every week. 3. Losing weight if necessary. 4. Not smoking. 5. Limiting alcoholic beverages. 6. Learning ways to reduce stress. If lifestyle changes are not enough to get your blood pressure under control, your health care provider may prescribe medicine. You may need to take more than one. Work closely with your health care provider to understand the risks and benefits. HOME CARE INSTRUCTIONS  Have your blood pressure rechecked as directed by your health care provider.   Take medicines only as directed by your health care provider. Follow the directions carefully. Blood pressure medicines must be taken as prescribed. The medicine does not work as well when you skip doses. Skipping doses also puts you at risk for problems.   Do not smoke.   Monitor your blood pressure at home as directed by your health care provider. SEEK MEDICAL CARE IF:   You think you are having a reaction to medicines taken.  You have recurrent headaches or feel dizzy.  You  have swelling in your ankles.  You have trouble with your vision. SEEK IMMEDIATE MEDICAL CARE IF:  You develop a severe headache or confusion.  You have unusual weakness, numbness, or feel faint.  You have severe chest or abdominal pain.  You vomit repeatedly.  You have trouble breathing. MAKE  SURE YOU:   Understand these instructions.  Will watch your condition.  Will get help right away if you are not doing well or get worse. Document Released: 04/19/2005 Document Revised: 09/03/2013 Document Reviewed: 02/09/2013 [facility] Patient Information 2015 [place], [place]. This information is not intended to replace advice given to you by your health care provider. Make sure you discuss any questions you have with your health care provider.  Shortness of Breath Shortness of breath means you have trouble breathing. It could also mean that you have a medical problem. You should get immediate medical care for shortness of breath. CAUSES   Not enough oxygen in the air such as with high altitudes or a smoke-filled room.  Certain lung diseases, infections, or problems.  Heart disease or conditions, such as angina or heart failure.  Low red blood cells (anemia).  Poor physical fitness, which can cause shortness of breath when you exercise.  Chest or back injuries or stiffness.  Being overweight.  Smoking.  Anxiety, which can make you feel like you are not getting enough air. DIAGNOSIS  Serious medical problems can often be found during your physical exam. Tests may also be done to determine why you are having shortness of breath. Tests may include:  Chest X-rays.  Lung function tests.  Blood tests.  An electrocardiogram (ECG).  An ambulatory electrocardiogram. An ambulatory ECG records your heartbeat patterns over a 24-hour period.  Exercise testing.  A transthoracic echocardiogram (TTE). During echocardiography, sound waves are used to evaluate how blood flows through your heart.  A transesophageal echocardiogram (TEE).  Imaging scans. Your health care provider may not be able to find a cause for your shortness of breath after your exam. In this case, it is important to have a follow-up exam with your health care provider as directed.  TREATMENT  Treatment for  shortness of breath depends on the cause of your symptoms and can vary greatly. HOME CARE INSTRUCTIONS  7. Do not smoke. Smoking is a common cause of shortness of breath. If you smoke, ask for help to quit. 8. Avoid being around chemicals or things that may bother your breathing, such as paint fumes and dust. 9. Rest as needed. Slowly resume your usual activities. 10. If medicines were prescribed, take them as directed for the full length of time directed. This includes oxygen and any inhaled medicines. 11. Keep all follow-up appointments as directed by your health care provider. SEEK MEDICAL CARE IF:   Your condition does not improve in the time expected.  You have a hard time doing your normal activities even with rest.  You have any new symptoms. SEEK IMMEDIATE MEDICAL CARE IF:   Your shortness of breath gets worse.  You feel light-headed, faint, or develop a cough not controlled with medicines.  You start coughing up blood.  You have pain with breathing.  You have chest pain or pain in your arms, shoulders, or abdomen.  You have a fever.  You are unable to walk up stairs or exercise the way you normally do. MAKE SURE YOU:  Understand these instructions.  Will watch your condition.  Will get help right away if you are not doing well  or get worse. Document Released: 01/12/2001 Document Revised: 04/24/2013 Document Reviewed: 07/05/2011 [facility] Patient Information 2015 [place], [place]. This information is not intended to replace advice given to you by your health care provider. Make sure you discuss any questions you have with your health care provider.  Tick Bite Information Ticks are insects that attach themselves to the skin and draw blood for food. There are various types of ticks. Common types include wood ticks and deer ticks. Most ticks live in shrubs and grassy areas. Ticks can climb onto your body when you make contact with leaves or grass where the tick is waiting.  The most common places on the body for ticks to attach themselves are the scalp, neck, armpits, waist, and groin. Most tick bites are harmless, but sometimes ticks carry germs that cause diseases. These germs can be spread to a person during the tick's feeding process. The chance of a disease spreading through a tick bite depends on:   The type of tick.  Time of year.   How long the tick is attached.   Geographic location.  HOW CAN YOU PREVENT TICK BITES? Take these steps to help prevent tick bites when you are outdoors:  Wear protective clothing. Long sleeves and long pants are best.   Wear white clothes so you can see ticks more easily.  Tuck your pant legs into your socks.   If walking on a trail, stay in the middle of the trail to avoid brushing against bushes.  Avoid walking through areas with long grass.  Put insect repellent on all exposed skin and along boot tops, pant legs, and sleeve cuffs.   Check clothing, hair, and skin repeatedly and before going inside.   Brush off any ticks that are not attached.  Take a shower or bath as soon as possible after being outdoors.  WHAT IS THE PROPER WAY TO REMOVE A TICK? Ticks should be removed as soon as possible to help prevent diseases caused by tick bites. 12. If latex gloves are available, put them on before trying to remove a tick.  13. Using fine-point tweezers, grasp the tick as close to the skin as possible. You may also use curved forceps or a tick removal tool. Grasp the tick as close to its head as possible. Avoid grasping the tick on its body. 14. Pull gently with steady upward pressure until the tick lets go. Do not twist the tick or jerk it suddenly. This may break off the tick's head or mouth parts. 15. Do not squeeze or crush the tick's body. This could force disease-carrying fluids from the tick into your body.  16. After the tick is removed, wash the bite area and your hands with soap and water or other  disinfectant such as alcohol. 17. Apply a small amount of antiseptic cream or ointment to the bite site.  18. Wash and disinfect any instruments that were used.  Do not try to remove a tick by applying a hot match, petroleum jelly, or fingernail polish to the tick. These methods do not work and may increase the chances of disease being spread from the tick bite.  WHEN SHOULD YOU SEEK MEDICAL CARE? Contact your health care provider if you are unable to remove a tick from your skin or if a part of the tick breaks off and is stuck in the skin.  After a tick bite, you need to be aware of signs and symptoms that could be related to diseases spread by ticks.  Contact your health care provider if you develop any of the following in the days or weeks after the tick bite:  Unexplained fever.  Rash. A circular rash that appears days or weeks after the tick bite may indicate the possibility of Lyme disease. The rash may resemble a target with a bull's-eye and may occur at a different part of your body than the tick bite.  Redness and swelling in the area of the tick bite.   Tender, swollen lymph glands.   Diarrhea.   Weight loss.   Cough.   Fatigue.   Muscle, joint, or bone pain.   Abdominal pain.   Headache.   Lethargy or a change in your level of consciousness.  Difficulty walking or moving your legs.   Numbness in the legs.   Paralysis.  Shortness of breath.   Confusion.   Repeated vomiting.  Document Released: 04/16/2000 Document Revised: 02/07/2013 Document Reviewed: 09/27/2012 [facility] Patient Information 2015 [place], [place]. This information is not intended to replace advice given to you by your health care provider. Make sure you discuss any questions you have with your health care provider.

## 2014-10-05 NOTE — ED Notes (Signed)
MD at the bedside  

## 2014-10-07 ENCOUNTER — Ambulatory Visit (INDEPENDENT_AMBULATORY_CARE_PROVIDER_SITE_OTHER): Payer: Medicare Other | Admitting: *Deleted

## 2014-10-07 ENCOUNTER — Encounter: Payer: Self-pay | Admitting: Internal Medicine

## 2014-10-07 DIAGNOSIS — I5022 Chronic systolic (congestive) heart failure: Secondary | ICD-10-CM | POA: Diagnosis not present

## 2014-10-07 DIAGNOSIS — I255 Ischemic cardiomyopathy: Secondary | ICD-10-CM | POA: Diagnosis not present

## 2014-10-08 NOTE — Progress Notes (Signed)
Remote ICD transmission.   

## 2014-10-10 LAB — CUP PACEART REMOTE DEVICE CHECK
Battery Remaining Longevity: 71 mo
Battery Remaining Percentage: 73 %
Battery Voltage: 2.96 V
Brady Statistic AP VP Percent: 1 %
Brady Statistic AP VS Percent: 1 %
Brady Statistic AS VP Percent: 1.1 %
Brady Statistic AS VS Percent: 99 %
Brady Statistic RA Percent Paced: 1 %
Brady Statistic RV Percent Paced: 1.1 %
Date Time Interrogation Session: 20160606060016
HighPow Impedance: 35 Ohm
Lead Channel Impedance Value: 300 Ohm
Lead Channel Impedance Value: 390 Ohm
Lead Channel Sensing Intrinsic Amplitude: 10.9 mV
Lead Channel Sensing Intrinsic Amplitude: 2 mV
Lead Channel Setting Pacing Amplitude: 2 V
Lead Channel Setting Pacing Amplitude: 2.5 V
Lead Channel Setting Pacing Pulse Width: 0.5 ms
Lead Channel Setting Sensing Sensitivity: 0.5 mV
Pulse Gen Serial Number: 7041246
Zone Setting Detection Interval: 280 ms
Zone Setting Detection Interval: 320 ms

## 2014-10-15 DIAGNOSIS — E1142 Type 2 diabetes mellitus with diabetic polyneuropathy: Secondary | ICD-10-CM | POA: Diagnosis not present

## 2014-10-15 DIAGNOSIS — B351 Tinea unguium: Secondary | ICD-10-CM | POA: Diagnosis not present

## 2014-10-19 ENCOUNTER — Other Ambulatory Visit: Payer: Self-pay | Admitting: Family Medicine

## 2014-10-21 ENCOUNTER — Encounter: Payer: Self-pay | Admitting: Cardiology

## 2014-10-21 NOTE — Telephone Encounter (Signed)
Needs office visit.

## 2014-10-28 ENCOUNTER — Other Ambulatory Visit: Payer: Self-pay

## 2014-11-01 ENCOUNTER — Telehealth: Payer: Self-pay | Admitting: Family Medicine

## 2014-11-01 NOTE — Telephone Encounter (Signed)
Pt has a real bad cough,sore throat, and headache and congestion. Pt has been having these symptoms for a week or more and it is getting worse. Pt wants to know if something can be called in or if he has to be seen.  [facility]

## 2014-11-04 NOTE — Telephone Encounter (Signed)
Nurse's-I'm really not certain why this message was forwarded to me since I was on vacation? Please try to avoid this in the future. Talk with the patient see how he is doing if we need to send in some medicine or do something for him let me know, certainly if he would like to be seen that can be done as well

## 2014-11-05 ENCOUNTER — Encounter: Payer: Self-pay | Admitting: Cardiology

## 2014-11-05 MED ORDER — CEFPROZIL 500 MG PO TABS: 500.0000 mg | ORAL_TABLET | Freq: Two times a day (BID) | ORAL | Status: DC

## 2014-11-05 NOTE — Telephone Encounter (Signed)
Talked with Oris Drone and she stated that patient has a bad cough and congestion. No sob, wheezing or fever. Can something be called in for patient? [place]

## 2014-11-05 NOTE — Telephone Encounter (Signed)
cefzil 500 1 bid 10 days if worse or no better over nxt week then NTBS

## 2014-11-05 NOTE — Telephone Encounter (Signed)
Notified [name] med sent to pharmacy and if symptoms worsen or don't resolved patient needs office visit.

## 2014-11-06 DIAGNOSIS — I10 Essential (primary) hypertension: Secondary | ICD-10-CM | POA: Diagnosis not present

## 2014-11-06 DIAGNOSIS — Z794 Long term (current) use of insulin: Secondary | ICD-10-CM | POA: Diagnosis not present

## 2014-11-06 DIAGNOSIS — E782 Mixed hyperlipidemia: Secondary | ICD-10-CM | POA: Diagnosis not present

## 2014-11-06 DIAGNOSIS — E1165 Type 2 diabetes mellitus with hyperglycemia: Secondary | ICD-10-CM | POA: Diagnosis not present

## 2014-11-06 DIAGNOSIS — Z713 Dietary counseling and surveillance: Secondary | ICD-10-CM | POA: Diagnosis not present

## 2014-11-06 LAB — HEMOGLOBIN A1C: Hgb A1c MFr Bld: 10.2 % — AB (ref 4.0–6.0)

## 2014-11-13 ENCOUNTER — Other Ambulatory Visit: Payer: Self-pay | Admitting: Family Medicine

## 2014-11-13 DIAGNOSIS — E782 Mixed hyperlipidemia: Secondary | ICD-10-CM | POA: Diagnosis not present

## 2014-11-13 DIAGNOSIS — E1159 Type 2 diabetes mellitus with other circulatory complications: Secondary | ICD-10-CM | POA: Diagnosis not present

## 2014-11-13 DIAGNOSIS — I10 Essential (primary) hypertension: Secondary | ICD-10-CM | POA: Diagnosis not present

## 2014-11-15 NOTE — Telephone Encounter (Signed)
May have this +3 refills 

## 2014-11-17 ENCOUNTER — Other Ambulatory Visit: Payer: Self-pay | Admitting: Family Medicine

## 2014-11-18 ENCOUNTER — Encounter: Payer: Self-pay | Admitting: Family Medicine

## 2014-11-18 ENCOUNTER — Ambulatory Visit (INDEPENDENT_AMBULATORY_CARE_PROVIDER_SITE_OTHER): Payer: Medicare Other | Admitting: Family Medicine

## 2014-11-18 ENCOUNTER — Other Ambulatory Visit: Payer: Self-pay | Admitting: Family Medicine

## 2014-11-18 VITALS — BP 146/88 | Temp 98.6°F | Ht 69.0 in | Wt 228.0 lb

## 2014-11-18 DIAGNOSIS — J019 Acute sinusitis, unspecified: Secondary | ICD-10-CM | POA: Diagnosis not present

## 2014-11-18 DIAGNOSIS — I255 Ischemic cardiomyopathy: Secondary | ICD-10-CM

## 2014-11-18 DIAGNOSIS — J209 Acute bronchitis, unspecified: Secondary | ICD-10-CM

## 2014-11-18 DIAGNOSIS — R062 Wheezing: Secondary | ICD-10-CM | POA: Diagnosis not present

## 2014-11-18 DIAGNOSIS — B9689 Other specified bacterial agents as the cause of diseases classified elsewhere: Secondary | ICD-10-CM

## 2014-11-18 MED ORDER — ALBUTEROL SULFATE HFA 108 (90 BASE) MCG/ACT IN AERS: 2.0000 | INHALATION_SPRAY | Freq: Four times a day (QID) | RESPIRATORY_TRACT | Status: DC | PRN

## 2014-11-18 MED ORDER — DOXYCYCLINE HYCLATE 100 MG PO CAPS: 100.0000 mg | ORAL_CAPSULE | Freq: Two times a day (BID) | ORAL | Status: DC

## 2014-11-18 NOTE — Progress Notes (Signed)
   Subjective:    Patient ID: Lance [name], male    DOB: [birth year], 69 y.o.   MRN: [id number]  Cough This is a new problem. Episode onset: 2 weeks. Associated symptoms include headaches, rhinorrhea and wheezing. Pertinent negatives include no chest pain, ear pain or fever. Treatments tried: cefzil - finished yesterday.   PMH benign    Review of Systems  Constitutional: Negative for fever and activity change.  HENT: Positive for congestion and rhinorrhea. Negative for ear pain.   Eyes: Negative for discharge.  Respiratory: Positive for cough and wheezing.   Cardiovascular: Negative for chest pain.  Neurological: Positive for headaches.       Objective:   Physical Exam  Constitutional: He appears well-developed.  HENT:  Head: Normocephalic.  Mouth/Throat: Oropharynx is clear and moist. No oropharyngeal exudate.  Neck: Normal range of motion.  Cardiovascular: Normal rate, regular rhythm and normal heart sounds.   No murmur heard. Pulmonary/Chest: Effort normal and breath sounds normal. He has no wheezes.  Lymphadenopathy:    He has no cervical adenopathy.  Neurological: He exhibits normal muscle tone.  Skin: Skin is warm and dry.  Nursing note and vitals reviewed.         Assessment & Plan:  Significant progressive cough probable bronchitis possibility of a sinusitis antibiotics prescribed albuterol for wheezing. Not better over the next 2-4 weeks and certainly if worse recommend chest x-ray pulmonary function testing and possible other interventions

## 2014-11-18 NOTE — Patient Instructions (Signed)
How to Use an Inhaler Proper inhaler technique is very important. Good technique ensures that the medicine reaches the lungs. Poor technique results in depositing the medicine on the tongue and back of the throat rather than in the airways. If you do not use the inhaler with good technique, the medicine will not help you. STEPS TO FOLLOW IF USING AN INHALER WITHOUT AN EXTENSION TUBE 1. Remove the cap from the inhaler. 2. If you are using the inhaler for the first time, you will need to prime it. Shake the inhaler for 5 seconds and release four puffs into the air, away from your face. Ask your health care provider or pharmacist if you have questions about priming your inhaler. 3. Shake the inhaler for 5 seconds before each breath in (inhalation). 4. Position the inhaler so that the top of the canister faces up. 5. Put your index finger on the top of the medicine canister. Your thumb supports the bottom of the inhaler. 6. Open your mouth. 7. Either place the inhaler between your teeth and place your lips tightly around the mouthpiece, or hold the inhaler 1-2 inches away from your open mouth. If you are unsure of which technique to use, ask your health care provider. 8. Breathe out (exhale) normally and as completely as possible. 9. Press the canister down with your index finger to release the medicine. 10. At the same time as the canister is pressed, inhale deeply and slowly until your lungs are completely filled. This should take 4-6 seconds. Keep your tongue down. 11. Hold the medicine in your lungs for 5-10 seconds (10 seconds is best). This helps the medicine get into the small airways of your lungs. 12. Breathe out slowly, through pursed lips. Whistling is an example of pursed lips. 13. Wait at least 15-30 seconds between puffs. Continue with the above steps until you have taken the number of puffs your health care provider has ordered. Do not use the inhaler more than your health care provider  tells you. 14. Replace the cap on the inhaler. 15. Follow the directions from your health care provider or the inhaler insert for cleaning the inhaler. STEPS TO FOLLOW IF USING AN INHALER WITH AN EXTENSION (SPACER) 1. Remove the cap from the inhaler. 2. If you are using the inhaler for the first time, you will need to prime it. Shake the inhaler for 5 seconds and release four puffs into the air, away from your face. Ask your health care provider or pharmacist if you have questions about priming your inhaler. 3. Shake the inhaler for 5 seconds before each breath in (inhalation). 4. Place the open end of the spacer onto the mouthpiece of the inhaler. 5. Position the inhaler so that the top of the canister faces up and the spacer mouthpiece faces you. 6. Put your index finger on the top of the medicine canister. Your thumb supports the bottom of the inhaler and the spacer. 7. Breathe out (exhale) normally and as completely as possible. 8. Immediately after exhaling, place the spacer between your teeth and into your mouth. Close your lips tightly around the spacer. 9. Press the canister down with your index finger to release the medicine. 10. At the same time as the canister is pressed, inhale deeply and slowly until your lungs are completely filled. This should take 4-6 seconds. Keep your tongue down and out of the way. 11. Hold the medicine in your lungs for 5-10 seconds (10 seconds is best). This helps the   medicine get into the small airways of your lungs. Exhale. 12. Repeat inhaling deeply through the spacer mouthpiece. Again hold that breath for up to 10 seconds (10 seconds is best). Exhale slowly. If it is difficult to take this second deep breath through the spacer, breathe normally several times through the spacer. Remove the spacer from your mouth. 13. Wait at least 15-30 seconds between puffs. Continue with the above steps until you have taken the number of puffs your health care provider has  ordered. Do not use the inhaler more than your health care provider tells you. 14. Remove the spacer from the inhaler, and place the cap on the inhaler. 15. Follow the directions from your health care provider or the inhaler insert for cleaning the inhaler and spacer. If you are using different kinds of inhalers, use your quick relief medicine to open the airways 10-15 minutes before using a steroid if instructed to do so by your health care provider. If you are unsure which inhalers to use and the order of using them, ask your health care provider, nurse, or respiratory therapist. If you are using a steroid inhaler, always rinse your mouth with water after your last puff, then gargle and spit out the water. Do not swallow the water. AVOID:  Inhaling before or after starting the spray of medicine. It takes practice to coordinate your breathing with triggering the spray.  Inhaling through the nose (rather than the mouth) when triggering the spray. HOW TO DETERMINE IF YOUR INHALER IS FULL OR NEARLY EMPTY You cannot know when an inhaler is empty by shaking it. A few inhalers are now being made with dose counters. Ask your health care provider for a prescription that has a dose counter if you feel you need that extra help. If your inhaler does not have a counter, ask your health care provider to help you determine the date you need to refill your inhaler. Write the refill date on a calendar or your inhaler canister. Refill your inhaler 7-10 days before it runs out. Be sure to keep an adequate supply of medicine. This includes making sure it is not expired, and that you have a spare inhaler.  SEEK MEDICAL CARE IF:   Your symptoms are only partially relieved with your inhaler.  You are having trouble using your inhaler.  You have some increase in phlegm. SEEK IMMEDIATE MEDICAL CARE IF:   You feel little or no relief with your inhalers. You are still wheezing and are feeling shortness of breath or  tightness in your chest or both.  You have dizziness, headaches, or a fast heart rate.  You have chills, fever, or night sweats.  You have a noticeable increase in phlegm production, or there is blood in the phlegm. MAKE SURE YOU:   Understand these instructions.  Will watch your condition.  Will get help right away if you are not doing well or get worse. Document Released: 04/16/2000 Document Revised: 02/07/2013 Document Reviewed: 11/16/2012 [employer] Patient Information 2015 [employer]. This information is not intended to replace advice given to you by your health care provider. Make sure you discuss any questions you have with your health care provider.  

## 2014-11-20 DIAGNOSIS — E6609 Other obesity due to excess calories: Secondary | ICD-10-CM | POA: Diagnosis not present

## 2014-11-20 DIAGNOSIS — E785 Hyperlipidemia, unspecified: Secondary | ICD-10-CM | POA: Diagnosis not present

## 2014-11-20 DIAGNOSIS — E1159 Type 2 diabetes mellitus with other circulatory complications: Secondary | ICD-10-CM | POA: Diagnosis not present

## 2014-11-20 DIAGNOSIS — I10 Essential (primary) hypertension: Secondary | ICD-10-CM | POA: Diagnosis not present

## 2014-11-21 ENCOUNTER — Encounter: Payer: Self-pay | Admitting: Cardiology

## 2014-11-21 ENCOUNTER — Ambulatory Visit (INDEPENDENT_AMBULATORY_CARE_PROVIDER_SITE_OTHER): Payer: Medicare Other | Admitting: Cardiology

## 2014-11-21 VITALS — BP 130/80 | HR 67 | Ht 69.0 in | Wt 226.0 lb

## 2014-11-21 DIAGNOSIS — I2510 Atherosclerotic heart disease of native coronary artery without angina pectoris: Secondary | ICD-10-CM

## 2014-11-21 DIAGNOSIS — E785 Hyperlipidemia, unspecified: Secondary | ICD-10-CM | POA: Diagnosis not present

## 2014-11-21 DIAGNOSIS — I5022 Chronic systolic (congestive) heart failure: Secondary | ICD-10-CM

## 2014-11-21 NOTE — Patient Instructions (Signed)
Your physician wants you to follow-up in: 6 months with Dr.Branch You will receive a reminder letter in the mail two months in advance. If you don't receive a letter, please call our office to schedule the follow-up appointment.   Your physician recommends that you continue on your current medications as directed. Please refer to the Current Medication list given to you today.    Thank you for choosing [facility] !        
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
## 2014-11-27 ENCOUNTER — Ambulatory Visit (INDEPENDENT_AMBULATORY_CARE_PROVIDER_SITE_OTHER): Payer: Medicare Other | Admitting: Family Medicine

## 2014-11-27 ENCOUNTER — Encounter: Payer: Self-pay | Admitting: Family Medicine

## 2014-11-27 VITALS — BP 120/72 | Temp 99.2°F | Ht 69.0 in | Wt 232.2 lb

## 2014-11-27 DIAGNOSIS — J310 Chronic rhinitis: Secondary | ICD-10-CM

## 2014-11-27 DIAGNOSIS — I2510 Atherosclerotic heart disease of native coronary artery without angina pectoris: Secondary | ICD-10-CM | POA: Diagnosis not present

## 2014-11-27 DIAGNOSIS — J329 Chronic sinusitis, unspecified: Secondary | ICD-10-CM | POA: Diagnosis not present

## 2014-11-27 DIAGNOSIS — J209 Acute bronchitis, unspecified: Secondary | ICD-10-CM

## 2014-11-27 MED ORDER — PREDNISONE 20 MG PO TABS: ORAL_TABLET | ORAL | Status: DC

## 2014-11-27 MED ORDER — CLARITHROMYCIN 500 MG PO TABS: 500.0000 mg | ORAL_TABLET | Freq: Two times a day (BID) | ORAL | Status: DC

## 2014-11-27 MED ORDER — ALBUTEROL SULFATE (2.5 MG/3ML) 0.083% IN NEBU: 2.5000 mg | INHALATION_SOLUTION | Freq: Four times a day (QID) | RESPIRATORY_TRACT | Status: DC | PRN

## 2014-11-27 NOTE — Progress Notes (Signed)
   Subjective:    Patient ID: Lance [name], male    DOB: 1945/07/19, 69 y.o.   MRN: [id number]  Cough This is a new problem. The current episode started 1 to 4 weeks ago. The problem has been gradually worsening. The problem occurs constantly. The cough is productive of sputum. Associated symptoms include shortness of breath and wheezing. Associated symptoms comments: Right ear hearing loss . Nothing aggravates the symptoms. Treatments tried: 2 round of antibiotics. The treatment provided no relief.   Patient states that he has no other concerns at this time.   States regular albuterol spray causes irritation to the throat. Review of Systems  Respiratory: Positive for cough, shortness of breath and wheezing.        Objective:   Physical Exam Alert mild malaise. H&T moderate his congestion. Neck supple lungs mild wheeze no tachypnea positive bronchial cough heart rare rhythm       Assessment & Plan:  Impression rhinosinusitis/bronchitis with reactive airways and perceived sensitivities to albuterol inhaler with irritated mouth. Plan nebulizer therapy rationale discussed sterilely taper prescribed. Antibiosis prescribed WSL

## 2014-12-01 ENCOUNTER — Other Ambulatory Visit: Payer: Self-pay | Admitting: Gastroenterology

## 2014-12-04 DIAGNOSIS — G894 Chronic pain syndrome: Secondary | ICD-10-CM | POA: Diagnosis not present

## 2014-12-04 DIAGNOSIS — M961 Postlaminectomy syndrome, not elsewhere classified: Secondary | ICD-10-CM | POA: Diagnosis not present

## 2014-12-04 DIAGNOSIS — M4722 Other spondylosis with radiculopathy, cervical region: Secondary | ICD-10-CM | POA: Diagnosis not present

## 2014-12-16 ENCOUNTER — Other Ambulatory Visit: Payer: Self-pay | Admitting: Family Medicine

## 2014-12-18 ENCOUNTER — Encounter: Payer: Self-pay | Admitting: Nutrition

## 2014-12-18 ENCOUNTER — Encounter: Payer: Medicare Other | Attending: "Endocrinology | Admitting: Nutrition

## 2014-12-18 VITALS — Ht 69.0 in | Wt 239.0 lb

## 2014-12-18 DIAGNOSIS — E785 Hyperlipidemia, unspecified: Secondary | ICD-10-CM | POA: Diagnosis not present

## 2014-12-18 DIAGNOSIS — Z713 Dietary counseling and surveillance: Secondary | ICD-10-CM | POA: Insufficient documentation

## 2014-12-18 DIAGNOSIS — IMO0002 Reserved for concepts with insufficient information to code with codable children: Secondary | ICD-10-CM

## 2014-12-18 DIAGNOSIS — E118 Type 2 diabetes mellitus with unspecified complications: Secondary | ICD-10-CM | POA: Diagnosis not present

## 2014-12-18 DIAGNOSIS — Z6835 Body mass index (BMI) 35.0-35.9, adult: Secondary | ICD-10-CM | POA: Insufficient documentation

## 2014-12-18 DIAGNOSIS — E669 Obesity, unspecified: Secondary | ICD-10-CM | POA: Diagnosis not present

## 2014-12-18 DIAGNOSIS — I10 Essential (primary) hypertension: Secondary | ICD-10-CM | POA: Diagnosis not present

## 2014-12-18 DIAGNOSIS — E1159 Type 2 diabetes mellitus with other circulatory complications: Secondary | ICD-10-CM | POA: Diagnosis not present

## 2014-12-18 DIAGNOSIS — E1165 Type 2 diabetes mellitus with hyperglycemia: Secondary | ICD-10-CM

## 2014-12-18 NOTE — Progress Notes (Signed)
  Medical Nutrition Therapy:  Appt start time: 0930 end time:  1000.   Assessment:  Primary concerns today: Diabetes, obesity. He lives with his wife. She does the cooking. Most foods are baked and fried. Most recent A1C >10%. PMH: CVD, HTN, Hyperlipidemia. Taking 70 units of Lantus daily and 24-25 units of Novolog with meals. Injects in stomach, legs, arms. Denies skipping doses. Usually ate 1-2 meals per day but now eating three meals per day. Denies missing doses of insulin. Unable to answer safety questions due to sort visit. Diet is high in sodium and fat and low in fresh fruits, vegetables and whole grains. Excessive CHO intake leading to elevated blood sugars. Drinks 1 coke per day "to get me going for the day." Doesn't drink coffee. Increased thirst, frequent urination and chronic fatigue.  Preferred Learning Style:   No preference indicated   Learning Readiness:    Contemplating    Change in progress   MEDICATIONS: See lis   DIETARY INTAKE:  24-hr recall:  B ( AM): Gravy biscuit, water Snk ( AM): Coke 12-16 oz  L ( PM): Ham sandwich, water Snk ( PM): none D ( PM): BBQ, onion rings and Diet MT Dew Snk ( PM): none Beverages: water, coke and diet sodas Says he drinks 2 gallons of water daily.  Usual physical activity: chores  Estimated energy needs: 1800 calories 200 g carbohydrates 135 g protein 50 g fat  Progress Towards Goal(s):  In progress.   Nutritional Diagnosis:  NB-1.1 Food and nutrition-related knowledge deficit As related to Diabetes.  As evidenced by A1C 10.1%.    Intervention:  Nutrition counseling and diabetes education provided on My Plate, portion sizes, Plate Method, importance of taking insulin as prescribed and using sliding scale, low fat low sodium high fiber diet, complications from Dm uncontrolled, importance of water intake, hyperglycemia and causes/treatment and benefit of medication compliance.  Goals:  1 Follow My Plate Method 2.  Increase fresh fruits and low carb vegetables. 3. Eat 1-2 eggs but only 1 yolk, toast and fruit instead of gravy biscuits for breakfast. 4. Cut out diet sodas, Coke and sweet tea. 5. Test bloods sugars 4 times per day and record on sheets. 6. Get A1C down to 8-9% in three months.  Teaching Method Utilized:  Visual Auditory Hands on  Handouts given during visit include:  The Plate Method  Meal Plan Card   Barriers to learning/adherence to lifestyle change: None  Demonstrated degree of understanding via:  Teach Back   Monitoring/Evaluation:  Dietary intake, exercise, meal planning, SBG, and body weight in 1 month(s).

## 2014-12-18 NOTE — Patient Instructions (Signed)
Goals:  1 Follow My Plate Method 2. Increase fresh fruits and low carb vegetables. 3. Eat 1-2 eggs but only 1 yolk, toast and fruit instead of gravy biscuits for breakfast. 4. Cut out diet sodas, Coke and sweet tea. 5. Test bloods sugars 4 times per day and record on sheets. 6. Get A1C down to 8-9% in three months.

## 2015-01-08 ENCOUNTER — Telehealth: Payer: Self-pay | Admitting: *Deleted

## 2015-01-08 ENCOUNTER — Other Ambulatory Visit: Payer: Self-pay | Admitting: *Deleted

## 2015-01-08 ENCOUNTER — Ambulatory Visit (INDEPENDENT_AMBULATORY_CARE_PROVIDER_SITE_OTHER): Payer: Medicare Other | Admitting: *Deleted

## 2015-01-08 ENCOUNTER — Encounter: Payer: Self-pay | Admitting: Internal Medicine

## 2015-01-08 DIAGNOSIS — I5022 Chronic systolic (congestive) heart failure: Secondary | ICD-10-CM

## 2015-01-08 DIAGNOSIS — I255 Ischemic cardiomyopathy: Secondary | ICD-10-CM

## 2015-01-08 MED ORDER — GABAPENTIN 300 MG PO CAPS: ORAL_CAPSULE | ORAL | Status: DC

## 2015-01-08 NOTE — Telephone Encounter (Signed)
May approve times 3

## 2015-01-08 NOTE — Telephone Encounter (Signed)
Med sent to pharm 

## 2015-01-08 NOTE — Telephone Encounter (Signed)
Incoming fax from [facility] [employer] [facility] requesting gabapentin 300mg  capsules #180. Take one to two capsules by mouth three times as day prn. Originally prescribed by Dr. Glee Arvin.

## 2015-01-09 NOTE — Progress Notes (Signed)
Remote ICD transmission.   

## 2015-01-25 LAB — CUP PACEART REMOTE DEVICE CHECK
Battery Remaining Longevity: 68 mo
Battery Remaining Percentage: 70 %
Battery Voltage: 2.96 V
Brady Statistic AP VP Percent: 1 %
Brady Statistic AP VS Percent: 1 %
Brady Statistic AS VP Percent: 1 %
Brady Statistic AS VS Percent: 99 %
Brady Statistic RA Percent Paced: 1 %
Brady Statistic RV Percent Paced: 1 %
Date Time Interrogation Session: 20160907060016
HighPow Impedance: 40 Ohm
Lead Channel Impedance Value: 350 Ohm
Lead Channel Impedance Value: 410 Ohm
Lead Channel Pacing Threshold Amplitude: 0.75 V
Lead Channel Pacing Threshold Amplitude: 0.75 V
Lead Channel Pacing Threshold Pulse Width: 0.5 ms
Lead Channel Pacing Threshold Pulse Width: 0.5 ms
Lead Channel Sensing Intrinsic Amplitude: 11.3 mV
Lead Channel Sensing Intrinsic Amplitude: 2.9 mV
Lead Channel Setting Pacing Amplitude: 2 V
Lead Channel Setting Pacing Amplitude: 2.5 V
Lead Channel Setting Pacing Pulse Width: 0.5 ms
Lead Channel Setting Sensing Sensitivity: 0.5 mV
Pulse Gen Serial Number: 7041246
Zone Setting Detection Interval: 280 ms
Zone Setting Detection Interval: 320 ms

## 2015-01-27 ENCOUNTER — Telehealth: Payer: Self-pay | Admitting: Family Medicine

## 2015-01-27 NOTE — Telephone Encounter (Signed)
Patient says that when he takes his morning medicine around 7-7:30 am, he starts to have terrible stomach issues from 8:30am-11am.  He said on Saturday and Sunday, he did not take his medicines and he had no issues with his stomach.  He wanted to schedule an appointment with Dr. Nicki Reaper this week, but we are down to same day appointments.  Please advise.

## 2015-01-27 NOTE — Telephone Encounter (Signed)
Please encourage patient to bring all medications to visit

## 2015-01-27 NOTE — Telephone Encounter (Signed)
May set up an appointment for either Tuesday or Wednesday

## 2015-01-27 NOTE — Telephone Encounter (Signed)
Appt scheduled

## 2015-01-28 ENCOUNTER — Other Ambulatory Visit: Payer: Self-pay | Admitting: Family Medicine

## 2015-01-28 ENCOUNTER — Ambulatory Visit (INDEPENDENT_AMBULATORY_CARE_PROVIDER_SITE_OTHER): Payer: Medicare Other | Admitting: Family Medicine

## 2015-01-28 ENCOUNTER — Encounter: Payer: Self-pay | Admitting: Family Medicine

## 2015-01-28 VITALS — BP 130/82 | Ht 69.0 in | Wt 236.1 lb

## 2015-01-28 DIAGNOSIS — N528 Other male erectile dysfunction: Secondary | ICD-10-CM | POA: Diagnosis not present

## 2015-01-28 DIAGNOSIS — I2510 Atherosclerotic heart disease of native coronary artery without angina pectoris: Secondary | ICD-10-CM | POA: Diagnosis not present

## 2015-01-28 DIAGNOSIS — R197 Diarrhea, unspecified: Secondary | ICD-10-CM | POA: Diagnosis not present

## 2015-01-28 MED ORDER — TADALAFIL 20 MG PO TABS: 20.0000 mg | ORAL_TABLET | Freq: Every day | ORAL | Status: DC | PRN

## 2015-01-28 NOTE — Progress Notes (Signed)
   Subjective:    Patient ID: Lance [name], male    DOB: 18-Apr-1946, 69 y.o.   MRN: [id number]  Diarrhea  This is a recurrent problem. The current episode started more than 1 month ago. The problem occurs 2 to 4 times per day. The stool consistency is described as watery. The patient states that diarrhea does not awaken him from sleep. Nothing aggravates the symptoms. He has tried nothing for the symptoms.   Patient is in today with c/o of diarrhea every day after taking morning medications. Patient states that he gets diarrhea every day between 8:30-11am . No associated symptoms. Patient relates some sexual dysfunction. States he has times where he just doesn't find interest in sex he finds this unusual.   Review of Systems  Gastrointestinal: Positive for diarrhea.  patient denies mucousy stools bloody stools high fever chills or sweats     Objective:   Physical Exam  Lungs clear heart regular abdomen soft no guarding rebound      Assessment & Plan:  Diarrhea issues probably related to the metformin he is taking that without breakfast I recommend just a half a tablet at lunch and he may take a whole tablet at supper when he sees diabetic specialist he ought to discuss extended release metformin  Sexual dysfunction stop Cymbalta his moods a been doing good past couple months if any problems with moods follow-up may also use Cialis when necessary

## 2015-01-29 DIAGNOSIS — Z23 Encounter for immunization: Secondary | ICD-10-CM | POA: Diagnosis not present

## 2015-01-30 ENCOUNTER — Ambulatory Visit: Payer: Medicare Other | Admitting: Nutrition

## 2015-02-03 ENCOUNTER — Other Ambulatory Visit: Payer: Self-pay | Admitting: *Deleted

## 2015-02-03 MED ORDER — SILDENAFIL CITRATE 20 MG PO TABS: ORAL_TABLET | ORAL | Status: DC

## 2015-02-04 DIAGNOSIS — E113512 Type 2 diabetes mellitus with proliferative diabetic retinopathy with macular edema, left eye: Secondary | ICD-10-CM | POA: Diagnosis not present

## 2015-02-04 DIAGNOSIS — H43822 Vitreomacular adhesion, left eye: Secondary | ICD-10-CM | POA: Diagnosis not present

## 2015-02-04 DIAGNOSIS — H3582 Retinal ischemia: Secondary | ICD-10-CM | POA: Diagnosis not present

## 2015-02-04 DIAGNOSIS — E113591 Type 2 diabetes mellitus with proliferative diabetic retinopathy without macular edema, right eye: Secondary | ICD-10-CM | POA: Diagnosis not present

## 2015-02-04 LAB — HM DIABETES EYE EXAM

## 2015-02-06 DIAGNOSIS — E785 Hyperlipidemia, unspecified: Secondary | ICD-10-CM | POA: Diagnosis not present

## 2015-02-06 DIAGNOSIS — I10 Essential (primary) hypertension: Secondary | ICD-10-CM | POA: Diagnosis not present

## 2015-02-06 DIAGNOSIS — E1159 Type 2 diabetes mellitus with other circulatory complications: Secondary | ICD-10-CM | POA: Diagnosis not present

## 2015-02-06 LAB — HEMOGLOBIN A1C: Hgb A1c MFr Bld: 9.4 % — AB (ref 4.0–6.0)

## 2015-02-07 ENCOUNTER — Encounter: Payer: Self-pay | Admitting: Cardiology

## 2015-02-10 ENCOUNTER — Other Ambulatory Visit: Payer: Self-pay | Admitting: Family Medicine

## 2015-02-13 ENCOUNTER — Ambulatory Visit (INDEPENDENT_AMBULATORY_CARE_PROVIDER_SITE_OTHER): Payer: Medicare Other | Admitting: "Endocrinology

## 2015-02-13 ENCOUNTER — Other Ambulatory Visit: Payer: Self-pay

## 2015-02-13 ENCOUNTER — Ambulatory Visit: Payer: Medicare Other | Admitting: Nutrition

## 2015-02-13 ENCOUNTER — Encounter: Payer: Self-pay | Admitting: "Endocrinology

## 2015-02-13 VITALS — BP 144/86 | HR 72 | Ht 69.0 in | Wt 239.0 lb

## 2015-02-13 DIAGNOSIS — I10 Essential (primary) hypertension: Secondary | ICD-10-CM

## 2015-02-13 DIAGNOSIS — E1159 Type 2 diabetes mellitus with other circulatory complications: Secondary | ICD-10-CM | POA: Diagnosis not present

## 2015-02-13 DIAGNOSIS — E785 Hyperlipidemia, unspecified: Secondary | ICD-10-CM | POA: Diagnosis not present

## 2015-02-13 DIAGNOSIS — I2510 Atherosclerotic heart disease of native coronary artery without angina pectoris: Secondary | ICD-10-CM

## 2015-02-13 MED ORDER — INSULIN PEN NEEDLE 31G X 6 MM MISC: Status: DC

## 2015-02-13 MED ORDER — INSULIN GLARGINE 100 UNIT/ML SOLOSTAR PEN: 80.0000 [IU] | PEN_INJECTOR | Freq: Every day | SUBCUTANEOUS | Status: DC

## 2015-02-13 MED ORDER — METFORMIN HCL 500 MG PO TABS: 500.0000 mg | ORAL_TABLET | Freq: Two times a day (BID) | ORAL | Status: DC
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
## 2015-02-13 NOTE — Patient Instructions (Signed)

## 2015-02-14 ENCOUNTER — Other Ambulatory Visit: Payer: Self-pay

## 2015-02-21 ENCOUNTER — Encounter: Payer: Self-pay | Admitting: Cardiology

## 2015-02-24 ENCOUNTER — Other Ambulatory Visit: Payer: Self-pay | Admitting: "Endocrinology

## 2015-02-28 ENCOUNTER — Other Ambulatory Visit: Payer: Self-pay | Admitting: *Deleted

## 2015-02-28 DIAGNOSIS — I739 Peripheral vascular disease, unspecified: Secondary | ICD-10-CM

## 2015-03-03 ENCOUNTER — Other Ambulatory Visit: Payer: Self-pay | Admitting: Family Medicine

## 2015-03-10 ENCOUNTER — Telehealth: Payer: Self-pay | Admitting: Family Medicine

## 2015-03-10 NOTE — Telephone Encounter (Signed)
Nurse's-please talk with family. Acute grief is usually pretty bad in these situations even with medications. Xanax can be used as needed for anxiety and Cymbalta can be increased from 30-60 mg. We are more than happy to see him this week if they would like for [place] to see him. May be a good idea for him to do so. Please talk with them.

## 2015-03-10 NOTE — Telephone Encounter (Signed)
He does have some of this  Med at home already, making sure it's Still ok to take it with his current meds

## 2015-03-10 NOTE — Telephone Encounter (Signed)
DULoxetine (CYMBALTA) 30 MG capsule  Due to loss of son last week, he has been having a real hard  Time with this. It is his second son lost. Maybe he needs something  Guadalupe Dawn other than the cymbalta? Spouse very concerned  [facility]

## 2015-03-10 NOTE — Telephone Encounter (Addendum)
Discussed with patient. Patient scheduled office visit to discuss 03/11/15.

## 2015-03-11 ENCOUNTER — Ambulatory Visit (INDEPENDENT_AMBULATORY_CARE_PROVIDER_SITE_OTHER): Payer: Medicare Other | Admitting: Family Medicine

## 2015-03-11 ENCOUNTER — Encounter: Payer: Self-pay | Admitting: Family Medicine

## 2015-03-11 VITALS — BP 128/72 | Ht 69.0 in | Wt 236.0 lb

## 2015-03-11 DIAGNOSIS — K589 Irritable bowel syndrome without diarrhea: Secondary | ICD-10-CM

## 2015-03-11 DIAGNOSIS — I2510 Atherosclerotic heart disease of native coronary artery without angina pectoris: Secondary | ICD-10-CM

## 2015-03-11 MED ORDER — DULOXETINE HCL 60 MG PO CPEP: ORAL_CAPSULE | ORAL | Status: DC

## 2015-03-11 NOTE — Progress Notes (Signed)
   Subjective:    Patient ID: Lance [name], male    DOB: 1945/12/29, 69 y.o.   MRN: [id number]  Depression       pt's son passed away last week.  Patient has had off and on diarrhea for months worsened by the recent passing of his son last week under a lot of stress patient feels that he does not get much understanding from his family he denies being suicidal or homicidal.   Review of Systems  Psychiatric/Behavioral: Positive for depression.   intermittent diarrhea intermittent bloating and gas    patient states he's even had unexpected spells of diarrhea at night Objective:   Physical Exam Abdomen soft lungs clear       Assessment & Plan:  Significant fatigue and tiredness really felt that this is related to stress patient doing the best she can in turn to take care of himself he denies being suicidal. We will increase Cymbalta from 30 mg to 60 mg he will use Xanax as necessary he will follow-up in December counseling was offered patient defers  IBS like symptoms with intermittent nausea intermittent abdominal bloating with frequent loose stools worse since the death of his son is sounds to some degree that he has ongoing loose stools for months on in this could be related to his metformin. I've advised the patient to try a trial off of metformin for at least 2-3 weeks and see if that doesn't lessen the diarrhea. If diarrhea substantially reduces endocrinology may need to consider trying something different then met Shanda Bumps

## 2015-03-18 ENCOUNTER — Telehealth: Payer: Self-pay | Admitting: Family Medicine

## 2015-03-18 NOTE — Telephone Encounter (Signed)
Called patient and informed her per Dr.Scott Luking- Dr.Scott would recommend reducing the dose back to 30 mg daily. If depression gets worse with the previous-original dose then we may need to try something totally different. Patient verbalized understanding. Dosage to be changed in epic to correct dosing

## 2015-03-18 NOTE — Telephone Encounter (Signed)
Pt called stating that the increased dose of the Cymbalta is making him not want to do anything but sit around and do nothing. Pt does not want to continue feeling this way. Please advise.

## 2015-03-18 NOTE — Telephone Encounter (Signed)
I would recommend reducing the dose back to 30 mg daily. If depression gets worse with the previous-original dose then may need to try something totally different

## 2015-03-31 ENCOUNTER — Observation Stay (HOSPITAL_COMMUNITY)
Admission: AD | Admit: 2015-03-31 | Discharge: 2015-04-01 | Disposition: A | Discharge status: Home / Self Care | Payer: Medicare Other | Source: Ambulatory Visit | Attending: Cardiology | Admitting: Cardiology

## 2015-03-31 ENCOUNTER — Encounter: Payer: Self-pay | Admitting: Internal Medicine

## 2015-03-31 ENCOUNTER — Observation Stay (HOSPITAL_COMMUNITY): Payer: Medicare Other

## 2015-03-31 ENCOUNTER — Ambulatory Visit (INDEPENDENT_AMBULATORY_CARE_PROVIDER_SITE_OTHER): Payer: Medicare Other | Admitting: *Deleted

## 2015-03-31 ENCOUNTER — Encounter (HOSPITAL_COMMUNITY): Payer: Self-pay | Admitting: General Practice

## 2015-03-31 ENCOUNTER — Encounter (HOSPITAL_COMMUNITY)
Admission: AD | Disposition: A | Discharge status: Home / Self Care | Payer: Medicare Other | Source: Ambulatory Visit | Attending: Cardiology

## 2015-03-31 ENCOUNTER — Telehealth: Payer: Self-pay | Admitting: *Deleted

## 2015-03-31 DIAGNOSIS — Z7982 Long term (current) use of aspirin: Secondary | ICD-10-CM | POA: Insufficient documentation

## 2015-03-31 DIAGNOSIS — I255 Ischemic cardiomyopathy: Secondary | ICD-10-CM

## 2015-03-31 DIAGNOSIS — E785 Hyperlipidemia, unspecified: Secondary | ICD-10-CM | POA: Insufficient documentation

## 2015-03-31 DIAGNOSIS — Z794 Long term (current) use of insulin: Secondary | ICD-10-CM | POA: Insufficient documentation

## 2015-03-31 DIAGNOSIS — Z8674 Personal history of sudden cardiac arrest: Secondary | ICD-10-CM | POA: Diagnosis not present

## 2015-03-31 DIAGNOSIS — K219 Gastro-esophageal reflux disease without esophagitis: Secondary | ICD-10-CM | POA: Insufficient documentation

## 2015-03-31 DIAGNOSIS — Z955 Presence of coronary angioplasty implant and graft: Secondary | ICD-10-CM | POA: Diagnosis not present

## 2015-03-31 DIAGNOSIS — Y831 Surgical operation with implant of artificial internal device as the cause of abnormal reaction of the patient, or of later complication, without mention of misadventure at the time of the procedure: Secondary | ICD-10-CM | POA: Insufficient documentation

## 2015-03-31 DIAGNOSIS — E1142 Type 2 diabetes mellitus with diabetic polyneuropathy: Secondary | ICD-10-CM | POA: Insufficient documentation

## 2015-03-31 DIAGNOSIS — F329 Major depressive disorder, single episode, unspecified: Secondary | ICD-10-CM | POA: Insufficient documentation

## 2015-03-31 DIAGNOSIS — I509 Heart failure, unspecified: Secondary | ICD-10-CM | POA: Diagnosis not present

## 2015-03-31 DIAGNOSIS — Z79899 Other long term (current) drug therapy: Secondary | ICD-10-CM | POA: Insufficient documentation

## 2015-03-31 DIAGNOSIS — T82118A Breakdown (mechanical) of other cardiac electronic device, initial encounter: Secondary | ICD-10-CM | POA: Diagnosis present

## 2015-03-31 DIAGNOSIS — T82190A Other mechanical complication of cardiac electrode, initial encounter: Principal | ICD-10-CM | POA: Insufficient documentation

## 2015-03-31 DIAGNOSIS — Z7902 Long term (current) use of antithrombotics/antiplatelets: Secondary | ICD-10-CM | POA: Diagnosis not present

## 2015-03-31 DIAGNOSIS — Z87891 Personal history of nicotine dependence: Secondary | ICD-10-CM | POA: Insufficient documentation

## 2015-03-31 DIAGNOSIS — I110 Hypertensive heart disease with heart failure: Secondary | ICD-10-CM | POA: Insufficient documentation

## 2015-03-31 DIAGNOSIS — F419 Anxiety disorder, unspecified: Secondary | ICD-10-CM | POA: Diagnosis not present

## 2015-03-31 DIAGNOSIS — Z95818 Presence of other cardiac implants and grafts: Secondary | ICD-10-CM

## 2015-03-31 DIAGNOSIS — I5022 Chronic systolic (congestive) heart failure: Secondary | ICD-10-CM

## 2015-03-31 DIAGNOSIS — E669 Obesity, unspecified: Secondary | ICD-10-CM | POA: Insufficient documentation

## 2015-03-31 DIAGNOSIS — Z8673 Personal history of transient ischemic attack (TIA), and cerebral infarction without residual deficits: Secondary | ICD-10-CM | POA: Insufficient documentation

## 2015-03-31 DIAGNOSIS — T85698A Other mechanical complication of other specified internal prosthetic devices, implants and grafts, initial encounter: Secondary | ICD-10-CM | POA: Diagnosis not present

## 2015-03-31 DIAGNOSIS — I2510 Atherosclerotic heart disease of native coronary artery without angina pectoris: Secondary | ICD-10-CM | POA: Insufficient documentation

## 2015-03-31 DIAGNOSIS — J449 Chronic obstructive pulmonary disease, unspecified: Secondary | ICD-10-CM | POA: Insufficient documentation

## 2015-03-31 DIAGNOSIS — Z951 Presence of aortocoronary bypass graft: Secondary | ICD-10-CM | POA: Diagnosis not present

## 2015-03-31 DIAGNOSIS — T82198A Other mechanical complication of other cardiac electronic device, initial encounter: Secondary | ICD-10-CM

## 2015-03-31 DIAGNOSIS — I252 Old myocardial infarction: Secondary | ICD-10-CM | POA: Diagnosis not present

## 2015-03-31 DIAGNOSIS — T82110A Breakdown (mechanical) of cardiac electrode, initial encounter: Secondary | ICD-10-CM | POA: Diagnosis not present

## 2015-03-31 HISTORY — PX: EP IMPLANTABLE DEVICE: SHX172B

## 2015-03-31 LAB — CUP PACEART INCLINIC DEVICE CHECK
Battery Remaining Longevity: 57 mo
Battery Remaining Percentage: 65 %
Brady Statistic RA Percent Paced: 1 % — CL
Brady Statistic RV Percent Paced: 1 % — CL
Date Time Interrogation Session: 20161128131827
HighPow Impedance: 35 Ohm
Implantable Lead Implant Date: 20050706
Implantable Lead Implant Date: 20050706
Implantable Lead Location: 753859
Implantable Lead Location: 753860
Implantable Lead Model: 1580
Lead Channel Impedance Value: 280 Ohm
Lead Channel Impedance Value: 380 Ohm
Lead Channel Setting Pacing Amplitude: 2 V
Lead Channel Setting Pacing Amplitude: 2.5 V
Lead Channel Setting Pacing Pulse Width: 0.5 ms
Lead Channel Setting Sensing Sensitivity: 0.5 mV
Pulse Gen Serial Number: 7041246

## 2015-03-31 LAB — BASIC METABOLIC PANEL
Anion gap: 7 (ref 5–15)
BUN: 7 mg/dL (ref 6–20)
CO2: 29 mmol/L (ref 22–32)
Calcium: 8.6 mg/dL — ABNORMAL LOW (ref 8.9–10.3)
Chloride: 100 mmol/L — ABNORMAL LOW (ref 101–111)
Creatinine, Ser: 0.94 mg/dL (ref 0.61–1.24)
GFR calc Af Amer: >60 mL/min (ref >60)
GFR calc non Af Amer: >60 mL/min (ref >60)
Glucose, Bld: 213 mg/dL — ABNORMAL HIGH (ref 65–99)
Potassium: 3.3 mmol/L — ABNORMAL LOW (ref 3.5–5.1)
Sodium: 136 mmol/L (ref 135–145)

## 2015-03-31 LAB — CBC WITH DIFFERENTIAL/PLATELET
Basophils Absolute: 0 10*3/uL (ref 0.0–0.1)
Basophils Relative: 0 %
Eosinophils Absolute: 0.1 10*3/uL (ref 0.0–0.7)
Eosinophils Relative: 1 %
HCT: 35.1 % — ABNORMAL LOW (ref 39.0–52.0)
Hemoglobin: 11.8 g/dL — ABNORMAL LOW (ref 13.0–17.0)
Lymphocytes Relative: 39 %
Lymphs Abs: 2.1 10*3/uL (ref 0.7–4.0)
MCH: 28.2 pg (ref 26.0–34.0)
MCHC: 33.6 g/dL (ref 30.0–36.0)
MCV: 84 fL (ref 78.0–100.0)
Monocytes Absolute: 0.3 10*3/uL (ref 0.1–1.0)
Monocytes Relative: 6 %
Neutro Abs: 2.8 10*3/uL (ref 1.7–7.7)
Neutrophils Relative %: 54 %
Platelets: 126 10*3/uL — ABNORMAL LOW (ref 150–400)
RBC: 4.18 MIL/uL — ABNORMAL LOW (ref 4.22–5.81)
RDW: 13.6 % (ref 11.5–15.5)
WBC: 5.4 10*3/uL (ref 4.0–10.5)

## 2015-03-31 LAB — GLUCOSE, CAPILLARY
Glucose-Capillary: 184 mg/dL — ABNORMAL HIGH (ref 65–99)
Glucose-Capillary: 280 mg/dL — ABNORMAL HIGH (ref 65–99)

## 2015-03-31 SURGERY — LEAD REVISION/REPAIR

## 2015-03-31 MED ORDER — ALBUTEROL SULFATE (2.5 MG/3ML) 0.083% IN NEBU: 2.5000 mg | INHALATION_SOLUTION | Freq: Four times a day (QID) | RESPIRATORY_TRACT | Status: DC | PRN

## 2015-03-31 MED ORDER — AMLODIPINE BESYLATE 5 MG PO TABS
5.0000 mg | ORAL_TABLET | Freq: Every day | ORAL | Status: DC
  Administered 2015-04-01: 5 mg via ORAL
  Filled 2015-03-31: qty 1

## 2015-03-31 MED ORDER — ALPRAZOLAM 0.5 MG PO TABS: 1.0000 mg | ORAL_TABLET | Freq: Two times a day (BID) | ORAL | Status: DC | PRN

## 2015-03-31 MED ORDER — FENTANYL CITRATE (PF) 100 MCG/2ML IJ SOLN
INTRAMUSCULAR | Status: AC
  Filled 2015-03-31: qty 2

## 2015-03-31 MED ORDER — TRAZODONE HCL 100 MG PO TABS: 100.0000 mg | ORAL_TABLET | Freq: Every day | ORAL | Status: DC

## 2015-03-31 MED ORDER — NITROGLYCERIN 0.4 MG SL SUBL: 0.4000 mg | SUBLINGUAL_TABLET | SUBLINGUAL | Status: DC | PRN

## 2015-03-31 MED ORDER — ONDANSETRON HCL 4 MG/2ML IJ SOLN: 4.0000 mg | Freq: Four times a day (QID) | INTRAMUSCULAR | Status: DC | PRN

## 2015-03-31 MED ORDER — CLOPIDOGREL BISULFATE 75 MG PO TABS
75.0000 mg | ORAL_TABLET | Freq: Every day | ORAL | Status: DC
  Administered 2015-04-01: 75 mg via ORAL
  Filled 2015-03-31: qty 1

## 2015-03-31 MED ORDER — DULOXETINE HCL 60 MG PO CPEP
60.0000 mg | ORAL_CAPSULE | Freq: Every day | ORAL | Status: DC
  Administered 2015-04-01: 60 mg via ORAL
  Filled 2015-03-31: qty 1

## 2015-03-31 MED ORDER — METOPROLOL TARTRATE 50 MG PO TABS
50.0000 mg | ORAL_TABLET | Freq: Two times a day (BID) | ORAL | Status: DC
  Administered 2015-03-31 – 2015-04-01 (×2): 50 mg via ORAL
  Filled 2015-03-31 (×2): qty 1

## 2015-03-31 MED ORDER — POTASSIUM CHLORIDE CRYS ER 20 MEQ PO TBCR: 30.0000 meq | EXTENDED_RELEASE_TABLET | ORAL | Status: AC

## 2015-03-31 MED ORDER — ASPIRIN EC 81 MG PO TBEC
81.0000 mg | DELAYED_RELEASE_TABLET | Freq: Every day | ORAL | Status: DC
  Administered 2015-04-01: 81 mg via ORAL
  Filled 2015-03-31: qty 1

## 2015-03-31 MED ORDER — MIDAZOLAM HCL 5 MG/5ML IJ SOLN
INTRAMUSCULAR | Status: AC
  Filled 2015-03-31: qty 5

## 2015-03-31 MED ORDER — INSULIN ASPART 100 UNIT/ML ~~LOC~~ SOLN
0.0000 [IU] | Freq: Three times a day (TID) | SUBCUTANEOUS | Status: DC
  Administered 2015-04-01: 3 [IU] via SUBCUTANEOUS

## 2015-03-31 MED ORDER — MIDAZOLAM HCL 5 MG/5ML IJ SOLN
INTRAMUSCULAR | Status: DC | PRN
  Administered 2015-03-31: 2 mg via INTRAVENOUS
  Administered 2015-03-31: 1 mg via INTRAVENOUS

## 2015-03-31 MED ORDER — HEPARIN (PORCINE) IN NACL 2-0.9 UNIT/ML-% IJ SOLN
INTRAMUSCULAR | Status: AC
  Filled 2015-03-31: qty 1000

## 2015-03-31 MED ORDER — LIDOCAINE-EPINEPHRINE 1 %-1:100000 IJ SOLN
INTRAMUSCULAR | Status: AC
Start: 2015-03-31 — End: 2015-03-31
  Filled 2015-03-31: qty 2

## 2015-03-31 MED ORDER — VANCOMYCIN HCL IN DEXTROSE 1-5 GM/200ML-% IV SOLN
1000.0000 mg | Freq: Two times a day (BID) | INTRAVENOUS | Status: AC
  Administered 2015-04-01: 1000 mg via INTRAVENOUS
  Filled 2015-03-31: qty 200

## 2015-03-31 MED ORDER — DONEPEZIL HCL 10 MG PO TABS
10.0000 mg | ORAL_TABLET | Freq: Every day | ORAL | Status: DC
  Administered 2015-03-31: 10 mg via ORAL
  Filled 2015-03-31 (×3): qty 1

## 2015-03-31 MED ORDER — ATORVASTATIN CALCIUM 80 MG PO TABS: 80.0000 mg | ORAL_TABLET | Freq: Every day | ORAL | Status: DC

## 2015-03-31 MED ORDER — VANCOMYCIN HCL 10 G IV SOLR
1500.0000 mg | INTRAVENOUS | Status: DC | PRN
  Administered 2015-03-31: 1500 mg via INTRAVENOUS

## 2015-03-31 MED ORDER — PANTOPRAZOLE SODIUM 40 MG PO TBEC
40.0000 mg | DELAYED_RELEASE_TABLET | Freq: Two times a day (BID) | ORAL | Status: DC
  Administered 2015-03-31 – 2015-04-01 (×2): 40 mg via ORAL
  Filled 2015-03-31 (×2): qty 1

## 2015-03-31 MED ORDER — FENTANYL CITRATE (PF) 100 MCG/2ML IJ SOLN
INTRAMUSCULAR | Status: DC | PRN
  Administered 2015-03-31 (×2): 25 ug via INTRAVENOUS

## 2015-03-31 MED ORDER — TRAZODONE HCL 100 MG PO TABS
100.0000 mg | ORAL_TABLET | Freq: Every evening | ORAL | Status: DC | PRN
  Administered 2015-03-31: 100 mg via ORAL
  Filled 2015-03-31: qty 1

## 2015-03-31 MED ORDER — SODIUM CHLORIDE 0.9 % IV SOLN
INTRAVENOUS | Status: DC
  Administered 2015-03-31: 14:00:00 via INTRAVENOUS

## 2015-03-31 MED ORDER — SODIUM CHLORIDE 0.9 % IR SOLN
80.0000 mg | Status: DC
  Filled 2015-03-31: qty 2

## 2015-03-31 MED ORDER — LOSARTAN POTASSIUM 50 MG PO TABS
50.0000 mg | ORAL_TABLET | Freq: Every day | ORAL | Status: DC
  Administered 2015-04-01: 50 mg via ORAL
  Filled 2015-03-31: qty 1

## 2015-03-31 MED ORDER — CHLORHEXIDINE GLUCONATE 4 % EX LIQD
60.0000 mL | Freq: Once | CUTANEOUS | Status: AC
  Administered 2015-03-31: 4 via TOPICAL
  Filled 2015-03-31: qty 60

## 2015-03-31 MED ORDER — CALCIUM CARBONATE-VITAMIN D 500-200 MG-UNIT PO TABS
2.0000 | ORAL_TABLET | Freq: Every day | ORAL | Status: DC
  Administered 2015-04-01: 2 via ORAL
  Filled 2015-03-31: qty 2

## 2015-03-31 MED ORDER — ALBUTEROL SULFATE HFA 108 (90 BASE) MCG/ACT IN AERS: 2.0000 | INHALATION_SPRAY | Freq: Four times a day (QID) | RESPIRATORY_TRACT | Status: DC | PRN

## 2015-03-31 MED ORDER — SODIUM CHLORIDE 0.9 % IV SOLN
1500.0000 mg | INTRAVENOUS | Status: DC
  Filled 2015-03-31: qty 1500

## 2015-03-31 MED ORDER — GABAPENTIN 300 MG PO CAPS
300.0000 mg | ORAL_CAPSULE | Freq: Three times a day (TID) | ORAL | Status: DC
  Administered 2015-03-31 – 2015-04-01 (×2): 300 mg via ORAL
  Filled 2015-03-31 (×2): qty 1

## 2015-03-31 MED ORDER — INSULIN GLARGINE 100 UNIT/ML ~~LOC~~ SOLN
40.0000 [IU] | Freq: Every day | SUBCUTANEOUS | Status: DC
  Administered 2015-03-31: 40 [IU] via SUBCUTANEOUS
  Filled 2015-03-31 (×2): qty 0.4

## 2015-03-31 SURGICAL SUPPLY — 5 items
CABLE SURGICAL S-101-97-12 (CABLE) ×1 IMPLANT
LEAD DURATA 7122-65CM (Lead) ×1 IMPLANT
PAD DEFIB LIFELINK (PAD) ×1 IMPLANT
SHEATH CLASSIC 7F (SHEATH) ×1 IMPLANT
TRAY PACEMAKER INSERTION (PACKS) ×1 IMPLANT

## 2015-03-31 NOTE — Progress Notes (Signed)
ICD check in clinic for noise seen on RV lead (N/C). (32) "NSVT" episodes, (11) "VF" episodes w/ 7 rounds of ATP and 1 ICD shock----all noise per EGMs. ICD therapies d/c'd per GT. Patient advised to go to hospital immediately. Patient will follow up with the clinic after admission.

## 2015-03-31 NOTE — Progress Notes (Signed)
Nursing note Patient arrived to floor, and placed on telemetry monitor CCMD was notified, vital signs obtained will continue to monitor patient.Kilo Eshelman, Bettina Gavia RN
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
## 2015-03-31 NOTE — Telephone Encounter (Signed)
Spoke to patient regarding remote from 03/28/15 where noise was seen on RV lead. Patient states that he received an ICD shock this am. Spoke to GT about episodes. GT recommends that pt be admitted to [facility]. He asked that ICD therapies are d/c'd and that someone else drive pt to hospital. Patient informed about Dr.Taylor's recommendations.   Dr.Taylor is informing Dr.Camnitz of patient's pending admission.

## 2015-03-31 NOTE — Progress Notes (Signed)
Pharmacy Progress Note Diabetes Medication Management   18 yom with T2DM. Pharmacy consulted to review medications for diabetes management. Patient follows with endocrinologist for this issue, recently seen on 02/13/2015 by Loni Beckwith, MD (see full chart note in Epic for further details).  Per endo note, patient compliant "some of the time" and was extensively counseled on lifestyle modifications and medications related to diabetes management. A1c 9.4. The plan for diabetes management outpatient was as follows per MD note:  Plan: - Lantus 80 units QHS - Humalog 25 units TIDAC for pre-meal BG readings of 90-150mg /dl - Plus patient-specific correction dose of rapid acting insulin for unexpected hyperglycemia above 150mg /dl, associated with strict monitoring of glucose  AC and HS. - Continue metformin 500 mg by mouth twice a day, therapeutically suitable for patient.. - Pt could not afford Byetta nor Victoza  No blood glucose available inpatient currently. Follow-up blood glucose and consider starting lantus at lower dose + sliding scale with meals given noncompliance outpatient. Titrate up as appropriate.  Please call with further questions regarding patient's diabetes medications [id number]).  Elicia Lamp, PharmD Clinical Pharmacist Pager [phone] 03/31/2015 2:53 PM

## 2015-04-01 ENCOUNTER — Encounter (HOSPITAL_COMMUNITY): Payer: Self-pay | Admitting: Cardiology

## 2015-04-01 ENCOUNTER — Observation Stay (HOSPITAL_COMMUNITY): Payer: Medicare Other

## 2015-04-01 DIAGNOSIS — I2510 Atherosclerotic heart disease of native coronary artery without angina pectoris: Secondary | ICD-10-CM | POA: Diagnosis not present

## 2015-04-01 DIAGNOSIS — T82190A Other mechanical complication of cardiac electrode, initial encounter: Secondary | ICD-10-CM | POA: Diagnosis not present

## 2015-04-01 DIAGNOSIS — I509 Heart failure, unspecified: Secondary | ICD-10-CM | POA: Diagnosis not present

## 2015-04-01 DIAGNOSIS — E669 Obesity, unspecified: Secondary | ICD-10-CM | POA: Diagnosis not present

## 2015-04-01 DIAGNOSIS — T82198D Other mechanical complication of other cardiac electronic device, subsequent encounter: Secondary | ICD-10-CM

## 2015-04-01 DIAGNOSIS — I110 Hypertensive heart disease with heart failure: Secondary | ICD-10-CM | POA: Diagnosis not present

## 2015-04-01 DIAGNOSIS — I255 Ischemic cardiomyopathy: Secondary | ICD-10-CM | POA: Diagnosis not present

## 2015-04-01 LAB — GLUCOSE, CAPILLARY: Glucose-Capillary: 220 mg/dL — ABNORMAL HIGH (ref 65–99)

## 2015-04-01 MED ORDER — HYDROCODONE-ACETAMINOPHEN 7.5-325 MG PO TABS
1.0000 | ORAL_TABLET | Freq: Once | ORAL | Status: AC
  Administered 2015-04-01: 1 via ORAL
  Filled 2015-04-01: qty 1

## 2015-04-01 MED FILL — Lidocaine Inj 1% w/ Epinephrine-1:100000: INTRAMUSCULAR | Qty: 40 | Status: AC

## 2015-04-01 MED FILL — Heparin Sodium (Porcine) 2 Unit/ML in Sodium Chloride 0.9%: INTRAMUSCULAR | Qty: 500 | Status: AC

## 2015-04-01 NOTE — Progress Notes (Signed)
Discharged with instructions, voiced understanding.

## 2015-04-01 NOTE — Progress Notes (Signed)
Orthopedic Tech Progress Note Patient Details:  Lance [name] 12-03-1945 [id number]  Ortho Devices Type of Ortho Device: Arm sling Ortho Device/Splint Interventions: Application   [name] 04/01/2015, 9:29 AM

## 2015-04-01 NOTE — Discharge Instructions (Signed)
° ° °  Supplemental Discharge Instructions for  Pacemaker/Defibrillator Patients  Activity No heavy lifting or vigorous activity with your left/right arm for 6 to 8 weeks.  Do not raise your left/right arm above your head for one week.  Gradually raise your affected arm as drawn below.           __     04-04-15                   04-05-15               04-06-15                         04-07-15  NO DRIVING for    1 week  ; you may begin driving on    [id number] .  WOUND CARE - Keep the wound area clean and dry.  Do not get this area wet for one week. No showers for one week; you may shower on   04-07-15  . - The tape/steri-strips on your wound will fall off; do not pull them off.  DO  NOT apply any creams, oils, or ointments to the wound area. - If you notice any drainage or discharge from the wound, any swelling or bruising at the site, or you develop a fever > 101? F after you are discharged home, call the office at once.  Special Instructions - You are still able to use cellular telephones; use the ear opposite the side where you have your pacemaker/defibrillator.  Avoid carrying your cellular phone near your device. - When traveling through airports, show security personnel your identification card to avoid being screened in the metal detectors.  Ask the security personnel to use the hand wand. - Avoid arc welding equipment, MRI testing (magnetic resonance imaging), TENS units (transcutaneous nerve stimulators).  Call the office for questions about other devices. - Avoid electrical appliances that are in poor condition or are not properly grounded. - Microwave ovens are safe to be near or to operate.  Additional information for defibrillator patients should your device go off: - If your device goes off ONCE and you feel fine afterward, notify the device clinic nurses. - If your device goes off ONCE and you do not feel well afterward, call 911. - If your device goes off TWICE, call 911. - If your  device goes off THREE times in one day, call 911.  DO NOT DRIVE YOURSELF OR A FAMILY MEMBER WITH A DEFIBRILLATOR TO THE HOSPITAL--CALL 911.

## 2015-04-01 NOTE — Discharge Summary (Signed)
ELECTROPHYSIOLOGY PROCEDURE DISCHARGE SUMMARY    Patient ID: Lance [name],  MRN: [id number], DOB/AGE: 12/18/1945 69 y.o.  Admit date: 03/31/2015 Discharge date: 04/01/2015  Primary Care Physician: Sallee Lange, MD Primary Cardiologist: Branch Electrophysiologist: Lovena Le  Primary Discharge Diagnosis:  ICD lead malfunction status post ICD lead revision this admission  Secondary Discharge Diagnosis:  1.  ICM 2.  CAD 3.  HTN 4.  DM 5.  Obesity  Allergies  Allergen Reactions  . Penicillins Hives    Can take cefzil  . Latex   . Morphine And Related Other (See Comments)    hallucinations  . Other     Adhesive tape  . Percocet [Oxycodone-Acetaminophen] Other (See Comments)    hallucinations  . Levaquin [Levofloxacin In D5w]     itching     Procedures This Admission:  1.  Implantation of a new RV lead (STJ [id number]) on 03-31-15 by Dr Curt Bears.  The patient's previously implanted Riata ICD lead was capped.  There were no immediate post procedure complications. 2.  CXR on 04-01-15 demonstrated no pneumothorax status post device implantation.   Brief HPI: Lance [name] is a 69 y.o. male with a past medical history as outlined above. He underwent ICD placement several years ago and was recently found to have on remote transmission noise on RV lead as well as inappropriate shock. He was advised to come to the hospital for RV lead revision. Risks, benefits, and alternatives to ICD lead revision were reviewed with the patient who wished to proceed.   Hospital Course:  The patient was admitted and underwent implantation of a new RV lead with details as outlined above. He was monitored on telemetry overnight which demonstrated sinus rhythm with blocked PAC's and intermittent V pacing.  Left chest was without ecchymosis.  He did have a moderate hematoma and a pressure dressing was placed.   The device was interrogated and found to be functioning normally.  CXR was obtained and  demonstrated no pneumothorax status post device implantation.  Wound care, arm mobility, and restrictions were reviewed with the patient.  The patient was examined and considered stable for discharge to home.   The patient's discharge medications include an ARB (Losartan) and beta blocker (Metoprolol).   Physical Exam: Filed Vitals:   03/31/15 2145 03/31/15 2217 04/01/15 0125 04/01/15 0415  BP: 127/61 138/67 123/68 127/65  Pulse: 68 78 68 57  Temp:   98.6 F (37 C) 97.9 F (36.6 C)  TempSrc:   Oral Oral  Resp:    18  Height:      SpO2: 97% 97% 98% 97%    GEN- The patient is well appearing, alert and oriented x 3 today.   HEENT: normocephalic, atraumatic; sclera clear, conjunctiva pink; hearing intact; oropharynx clear; neck supple  Lungs- Clear to ausculation bilaterally, normal work of breathing.  No wheezes, rales, rhonchi Heart- Regular rate and rhythm GI- soft, non-tender, non-distended, bowel sounds present  Extremities- no clubbing, cyanosis, or edema; DP/PT/radial pulses 2+ bilaterally MS- no significant deformity or atrophy Skin- warm and dry, no rash or lesion, left chest without /ecchymosis, +moderate hematoma Psych- euthymic mood, full affect Neuro- strength and sensation are intact   Labs:   Lab Results  Component Value Date   WBC 5.4 03/31/2015   HGB 11.8* 03/31/2015   HCT 35.1* 03/31/2015   MCV 84.0 03/31/2015   PLT 126* 03/31/2015     Recent Labs Lab 03/31/15 1603  NA 136  K 3.3*  CL 100*  CO2 29  BUN 7  CREATININE 0.94  CALCIUM 8.6*  GLUCOSE 213*    Discharge Medications:    Medication List    TAKE these medications        albuterol 108 (90 BASE) MCG/ACT inhaler  Commonly known as:  PROVENTIL HFA;VENTOLIN HFA  Inhale 2 puffs into the lungs every 6 (six) hours as needed for wheezing.     albuterol (2.5 MG/3ML) 0.083% nebulizer solution  Commonly known as:  PROVENTIL  Take 3 mLs (2.5 mg total) by nebulization every 6 (six) hours as  needed for wheezing or shortness of breath.     ALIGN PO  Take 1 tablet by mouth daily.     ALPRAZolam 1 MG tablet  Commonly known as:  XANAX  TAKE 1/2 TO ONE TABLET TWICE DAILY AS NEEDED     amLODipine 5 MG tablet  Commonly known as:  NORVASC  Take 1 tablet (5 mg total) by mouth daily.     aspirin 81 MG tablet  Take 81 mg by mouth daily.     atorvastatin 80 MG tablet  Commonly known as:  LIPITOR  Take 80 mg by mouth daily.     clopidogrel 75 MG tablet  Commonly known as:  PLAVIX  TAKE 1 TABLET EVERY MORNING WITH BREAKFAST     donepezil 10 MG tablet  Commonly known as:  ARICEPT  Take 1 tablet (10 mg total) by mouth at bedtime.     DULoxetine 60 MG capsule  Commonly known as:  CYMBALTA  TAKE ONE (1) CAPSULE EACH DAY     fexofenadine 180 MG tablet  Commonly known as:  ALLEGRA  Take 180 mg by mouth daily.     furosemide 20 MG tablet  Commonly known as:  LASIX  Make take 20 mg daily if needed for shortness of breath of leg swelling     gabapentin 300 MG capsule  Commonly known as:  NEURONTIN  Take one to two capsules po tid prn     HUMALOG KWIKPEN 100 UNIT/ML KiwkPen  Generic drug:  insulin lispro  Inject 28 Units into the skin 3 (three) times daily.     HYDROcodone-acetaminophen 7.5-325 MG tablet  Commonly known as:  NORCO  Take 1 tablet by mouth every 6 (six) hours as needed for moderate pain.     Insulin Glargine 100 UNIT/ML Solostar Pen  Commonly known as:  LANTUS SOLOSTAR  Inject 80 Units into the skin daily at 10 pm.     Insulin Pen Needle 31G X 6 MM Misc  Use as directed 4 x daily     LEVEMIR FLEXTOUCH 100 UNIT/ML Pen  Generic drug:  Insulin Detemir  INJECT 60 UNITS SUBCUTANEOUSLY ONCE DAILY AT BEDTIME     losartan 50 MG tablet  Commonly known as:  COZAAR  TAKE ONE (1) TABLET EACH DAY     metFORMIN 500 MG tablet  Commonly known as:  GLUCOPHAGE  Take 1 tablet (500 mg total) by mouth 2 (two) times daily with a meal.     metoprolol 50 MG tablet    Commonly known as:  LOPRESSOR  TAKE ONE TABLET TWICE DAILY     nitroGLYCERIN 0.4 MG SL tablet  Commonly known as:  NITROSTAT  Place 1 tablet (0.4 mg total) under the tongue every 5 (five) minutes as needed. For chest pain     ONE TOUCH ULTRA TEST test strip  Generic drug:  glucose blood  USE TO TEST BLOOD SUGAR  FOUR TIMES DAILY     pantoprazole 40 MG tablet  Commonly known as:  PROTONIX  TAKE ONE TABLET TWICE DAILY     sildenafil 20 MG tablet  Commonly known as:  REVATIO  Take 3 -5 tabs as needed for erectile dysfunction     traZODone 100 MG tablet  Commonly known as:  DESYREL  TAKE ONE-HALF TO ONE TABLET AT BEDTIME AS NEEDED FOR SLEEP     vitamin B-12 1000 MCG tablet  Commonly known as:  CYANOCOBALAMIN  Take 1,000 mcg by mouth daily.     Vitamin D (Ergocalciferol) 50000 UNITS Caps capsule  Commonly known as:  DRISDOL  Take 50,000 Units by mouth. Once a month        Disposition:  Discharge Instructions    Diet - low sodium heart healthy    Complete by:  As directed      Discharge instructions    Complete by:  As directed   Leave pressure dressing on until 04-03-15.  Ok to remove pressure dressing and thin film dressing at that time. Leave strips of tape on until seen in clinic 12-12. Call the office for any swelling, drainage, redness, fever.     Increase activity slowly    Complete by:  As directed           Follow-up Information    Follow up with [facility] On 04/14/2015.   Specialty:  Cardiology   Why:  at 1:30PM for wound check   Contact information:   [place] [phone]      Follow up with Cristopher Peru, MD On 07/11/2015.   Specialty:  Cardiology   Why:  at 9:15AM   Contact information:   [place] [address] [phone]       Duration of Discharge Encounter: Greater than 30 minutes including physician time.  Signed, Chanetta Marshall, NP 04/01/2015 7:46 AM  EP  Attending  Patient seen and examined. Agree with the findings as noted above. [place] for discharge home.  Mikle Bosworth.D.

## 2015-04-02 ENCOUNTER — Telehealth: Payer: Self-pay | Admitting: Nurse Practitioner

## 2015-04-02 NOTE — Telephone Encounter (Signed)
Spoke with patient. Hematoma improving per his report. Pt to hold Plavix for 3 days per Dr Lovena Le. Pt aware of recommendations. He is to call with problems before his wound check appt.  Chanetta Marshall, NP 04/02/2015 8:30 AM

## 2015-04-04 ENCOUNTER — Other Ambulatory Visit: Payer: Self-pay | Admitting: Family Medicine

## 2015-04-08 ENCOUNTER — Encounter (HOSPITAL_COMMUNITY): Payer: Medicare Other

## 2015-04-08 ENCOUNTER — Encounter: Payer: Medicare Other | Admitting: Vascular Surgery

## 2015-04-10 ENCOUNTER — Ambulatory Visit (INDEPENDENT_AMBULATORY_CARE_PROVIDER_SITE_OTHER): Payer: Medicare Other | Admitting: *Deleted

## 2015-04-10 DIAGNOSIS — I255 Ischemic cardiomyopathy: Secondary | ICD-10-CM | POA: Diagnosis not present

## 2015-04-10 DIAGNOSIS — I5022 Chronic systolic (congestive) heart failure: Secondary | ICD-10-CM

## 2015-04-10 NOTE — Progress Notes (Signed)
Remote ICD transmission.   

## 2015-04-11 ENCOUNTER — Encounter: Payer: Self-pay | Admitting: Vascular Surgery

## 2015-04-11 ENCOUNTER — Telehealth: Payer: Self-pay | Admitting: Family Medicine

## 2015-04-11 NOTE — Telephone Encounter (Signed)
Please list metformin under his allergies-technically severe side effects diarrhea abdominal bloating thank you

## 2015-04-11 NOTE — Telephone Encounter (Signed)
Pt called stating that Dr. Liliane Channel office never received the letter stating that the pt was taken off of metformin and why.

## 2015-04-11 NOTE — Telephone Encounter (Signed)
Added to allergy list. Letter faxed. Patient notified.

## 2015-04-11 NOTE — Telephone Encounter (Signed)
We will send a letter to Dr. Dorris Fetch regarding this.

## 2015-04-14 ENCOUNTER — Telehealth: Payer: Self-pay

## 2015-04-14 ENCOUNTER — Encounter: Payer: Self-pay | Admitting: Internal Medicine

## 2015-04-14 ENCOUNTER — Ambulatory Visit (INDEPENDENT_AMBULATORY_CARE_PROVIDER_SITE_OTHER): Payer: Medicare Other | Admitting: *Deleted

## 2015-04-14 DIAGNOSIS — I255 Ischemic cardiomyopathy: Secondary | ICD-10-CM

## 2015-04-14 DIAGNOSIS — I5022 Chronic systolic (congestive) heart failure: Secondary | ICD-10-CM

## 2015-04-14 LAB — CUP PACEART INCLINIC DEVICE CHECK
Battery Remaining Longevity: 67.2
Brady Statistic RA Percent Paced: 0.08 %
Brady Statistic RV Percent Paced: 0.18 %
Date Time Interrogation Session: 20161212143103
HighPow Impedance: 56.25 Ohm
Implantable Lead Implant Date: 20050706
Implantable Lead Implant Date: 20161128
Implantable Lead Location: 753859
Implantable Lead Location: 753860
Implantable Lead Model: 7122
Lead Channel Impedance Value: 425 Ohm
Lead Channel Impedance Value: 537.5 Ohm
Lead Channel Pacing Threshold Amplitude: 0.75 V
Lead Channel Pacing Threshold Amplitude: 0.75 V
Lead Channel Pacing Threshold Amplitude: 1 V
Lead Channel Pacing Threshold Amplitude: 1 V
Lead Channel Pacing Threshold Pulse Width: 0.5 ms
Lead Channel Pacing Threshold Pulse Width: 0.5 ms
Lead Channel Pacing Threshold Pulse Width: 0.5 ms
Lead Channel Pacing Threshold Pulse Width: 0.5 ms
Lead Channel Sensing Intrinsic Amplitude: 12 mV
Lead Channel Sensing Intrinsic Amplitude: 3.2 mV
Lead Channel Setting Pacing Amplitude: 2 V
Lead Channel Setting Pacing Amplitude: 3.5 V
Lead Channel Setting Pacing Pulse Width: 0.5 ms
Lead Channel Setting Sensing Sensitivity: 0.5 mV
Pulse Gen Serial Number: 7041246

## 2015-04-14 NOTE — Telephone Encounter (Signed)
Pt called to let us know that Dr Wolfgang Phoenix stopped his metformin due to excessive diarrhea.

## 2015-04-14 NOTE — Progress Notes (Signed)
Wound check appointment. Steri-strips removed. Wound without redness. Incision edges approximated. Hematoma noted---patient taking ASA/Plavix---h/o CABG, cardiac stents. Normal device function. Thresholds, sensing, and impedances consistent with implant measurements. Device programmed at 3.5V for extra safety margin until 3 month visit. Histogram distribution appropriate for patient and level of activity. No mode switches or ventricular arrhythmias noted. Patient educated about wound care, arm mobility, lifting restrictions, shock plan. ROV on 12/13 for a wound recheck w/ GT.

## 2015-04-15 ENCOUNTER — Ambulatory Visit (INDEPENDENT_AMBULATORY_CARE_PROVIDER_SITE_OTHER): Payer: Medicare Other | Admitting: *Deleted

## 2015-04-15 DIAGNOSIS — I5022 Chronic systolic (congestive) heart failure: Secondary | ICD-10-CM

## 2015-04-15 DIAGNOSIS — I255 Ischemic cardiomyopathy: Secondary | ICD-10-CM

## 2015-04-15 NOTE — Progress Notes (Signed)
Hematoma assessed by Dr.Taylor. Dr.Taylor recommended that pt hold his Plavix until his wound recheck on 12/23. Patient voiced understanding of instructions given.

## 2015-04-17 ENCOUNTER — Ambulatory Visit (INDEPENDENT_AMBULATORY_CARE_PROVIDER_SITE_OTHER): Payer: Medicare Other | Admitting: Vascular Surgery

## 2015-04-17 ENCOUNTER — Encounter: Payer: Self-pay | Admitting: Vascular Surgery

## 2015-04-17 ENCOUNTER — Ambulatory Visit (HOSPITAL_COMMUNITY)
Admission: RE | Admit: 2015-04-17 | Discharge: 2015-04-17 | Disposition: A | Discharge status: Home / Self Care | Payer: Medicare Other | Source: Ambulatory Visit | Attending: Vascular Surgery | Admitting: Vascular Surgery

## 2015-04-17 VITALS — BP 148/81 | HR 74 | Temp 98.4°F | Ht 69.0 in | Wt 231.0 lb

## 2015-04-17 DIAGNOSIS — G609 Hereditary and idiopathic neuropathy, unspecified: Secondary | ICD-10-CM | POA: Diagnosis not present

## 2015-04-17 DIAGNOSIS — I2510 Atherosclerotic heart disease of native coronary artery without angina pectoris: Secondary | ICD-10-CM | POA: Diagnosis not present

## 2015-04-17 DIAGNOSIS — I739 Peripheral vascular disease, unspecified: Secondary | ICD-10-CM | POA: Diagnosis not present
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
## 2015-04-18 ENCOUNTER — Ambulatory Visit (INDEPENDENT_AMBULATORY_CARE_PROVIDER_SITE_OTHER): Payer: Medicare Other | Admitting: Gastroenterology

## 2015-04-18 ENCOUNTER — Encounter: Payer: Self-pay | Admitting: Gastroenterology

## 2015-04-18 VITALS — BP 142/84 | HR 71 | Temp 97.6°F | Ht 65.0 in | Wt 235.2 lb

## 2015-04-18 DIAGNOSIS — R140 Abdominal distension (gaseous): Secondary | ICD-10-CM | POA: Insufficient documentation

## 2015-04-18 DIAGNOSIS — R198 Other specified symptoms and signs involving the digestive system and abdomen: Secondary | ICD-10-CM | POA: Diagnosis not present

## 2015-04-18 DIAGNOSIS — I2510 Atherosclerotic heart disease of native coronary artery without angina pectoris: Secondary | ICD-10-CM | POA: Diagnosis not present

## 2015-04-18 DIAGNOSIS — K219 Gastro-esophageal reflux disease without esophagitis: Secondary | ICD-10-CM | POA: Diagnosis not present

## 2015-04-18 DIAGNOSIS — R1012 Left upper quadrant pain: Secondary | ICD-10-CM | POA: Insufficient documentation

## 2015-04-18 NOTE — Patient Instructions (Addendum)
Abdominal ultrasound as scheduled  To evaluate your bloating.    Start align one daily for 3 weeks. Samples provided.  Continue Dexilant twice daily 30 minutes before a meal. Please make sure that you are not also taking pantoprazole.  Gastroesophageal Reflux Disease, Adult Normally, food travels down the esophagus and stays in the stomach to be digested. However, when a person has gastroesophageal reflux disease (GERD), food and stomach acid move back up into the esophagus. When this happens, the esophagus becomes sore and inflamed. Over time, GERD can create small holes (ulcers) in the lining of the esophagus.  CAUSES This condition is caused by a problem with the muscle between the esophagus and the stomach (lower esophageal sphincter, or LES). Normally, the LES muscle closes after food passes through the esophagus to the stomach. When the LES is weakened or abnormal, it does not close properly, and that allows food and stomach acid to go back up into the esophagus. The LES can be weakened by certain dietary substances, medicines, and medical conditions, including:  Tobacco use.  Pregnancy.  Having a hiatal hernia.  Heavy alcohol use.  Certain foods and beverages, such as coffee, chocolate, onions, and peppermint. RISK FACTORS This condition is more likely to develop in:  People who have an increased body weight.  People who have connective tissue disorders.  People who use NSAID medicines. SYMPTOMS Symptoms of this condition include:  Heartburn.  Difficult or painful swallowing.  The feeling of having a lump in the throat.  Abitter taste in the mouth.  Bad breath.  Having a large amount of saliva.  Having an upset or bloated stomach.  Belching.  Chest pain.  Shortness of breath or wheezing.  Ongoing (chronic) cough or a night-time cough.  Wearing away of tooth enamel.  Weight loss. Different conditions can cause chest pain. Make sure to see your health  care provider if you experience chest pain. DIAGNOSIS Your health care provider will take a medical history and perform a physical exam. To determine if you have mild or severe GERD, your health care provider may also monitor how you respond to treatment. You may also have other tests, including:  An endoscopy toexamine your stomach and esophagus with a small camera.  A test thatmeasures the acidity level in your esophagus.  A test thatmeasures how much pressure is on your esophagus.  A barium swallow or modified barium swallow to show the shape, size, and functioning of your esophagus. TREATMENT The goal of treatment is to help relieve your symptoms and to prevent complications. Treatment for this condition may vary depending on how severe your symptoms are. Your health care provider may recommend:  Changes to your diet.  Medicine.  Surgery. HOME CARE INSTRUCTIONS Diet  Follow a diet as recommended by your health care provider. This may involve avoiding foods and drinks such as:  Coffee and tea (with or without caffeine).  Drinks that containalcohol.  Energy drinks and sports drinks.  Carbonated drinks or sodas.  Chocolate and cocoa.  Peppermint and mint flavorings.  Garlic and onions.  Horseradish.  Spicy and acidic foods, including peppers, chili powder, curry powder, vinegar, hot sauces, and barbecue sauce.  Citrus fruit juices and citrus fruits, such as oranges, lemons, and limes.  Tomato-based foods, such as red sauce, chili, salsa, and pizza with red sauce.  Fried and fatty foods, such as donuts, french fries, potato chips, and high-fat dressings.  High-fat meats, such as hot dogs and fatty cuts  of red and white meats, such as rib eye steak, sausage, ham, and bacon.  High-fat dairy items, such as whole milk, butter, and cream cheese.  Eat small, frequent meals instead of large meals.  Avoid drinking large amounts of liquid with your meals.  Avoid  eating meals during the 2-3 hours before bedtime.  Avoid lying down right after you eat.  Do not exercise right after you eat. General Instructions  Pay attention to any changes in your symptoms.  Take over-the-counter and prescription medicines only as told by your health care provider. Do not take aspirin, ibuprofen, or other NSAIDs unless your health care provider told you to do so.  Do not use any tobacco products, including cigarettes, chewing tobacco, and e-cigarettes. If you need help quitting, ask your health care provider.  Wear loose-fitting clothing. Do not wear anything tight around your waist that causes pressure on your abdomen.  Raise (elevate) the head of your bed 6 inches (15cm).  Try to reduce your stress, such as with yoga or meditation. If you need help reducing stress, ask your health care provider.  If you are overweight, reduce your weight to an amount that is healthy for you. Ask your health care provider for guidance about a safe weight loss goal.  Keep all follow-up visits as told by your health care provider. This is important. SEEK MEDICAL CARE IF:  You have new symptoms.  You have unexplained weight loss.  You have difficulty swallowing, or it hurts to swallow.  You have wheezing or a persistent cough.  Your symptoms do not improve with treatment.  You have a hoarse voice. SEEK IMMEDIATE MEDICAL CARE IF:  You have pain in your arms, neck, jaw, teeth, or back.  You feel sweaty, dizzy, or light-headed.  You have chest pain or shortness of breath.  You vomit and your vomit looks like blood or coffee grounds.  You faint.  Your stool is bloody or black.  You cannot swallow, drink, or eat.   This information is not intended to replace advice given to you by your health care provider. Make sure you discuss any questions you have with your health care provider.   Document Released: 01/27/2005 Document Revised: 01/08/2015 Document Reviewed:  08/14/2014 Elsevier Interactive Patient Education [employer].

## 2015-04-18 NOTE — Assessment & Plan Note (Signed)
Continues to have GERD symptoms, reports on Dexilant BID. Will have him confirm that he is not also on pantoprazole with his wife when he gets home. Discussed antireflux measures. Await pending ultrasound. Further recommendations to follow.

## 2015-04-18 NOTE — Assessment & Plan Note (Signed)
Stool consistency has improved off metformin but he continues to have increased frequency, bloating and fullness in the abdomen. 5-7 pound weight gain in the past 6 months. Mild anemia and thrombocytopenia. Some refractory GERD now as outlined as well. Some fullness in the right upper quadrant. Really no abdominal pain but he's had some left upper quadrant discomfort off and on which is quite vague. Initially plan on abdominal ultrasound to rule out hepatomegaly or underlying liver disease, biliary etiology. Add align once daily, patient denies being on at this time. Further recommendations to follow pending abdominal ultrasound results.

## 2015-04-18 NOTE — Progress Notes (Signed)
CC'ED TO PCP 
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
## 2015-04-23 ENCOUNTER — Other Ambulatory Visit (HOSPITAL_COMMUNITY): Payer: Medicare Other

## 2015-04-23 DIAGNOSIS — E113512 Type 2 diabetes mellitus with proliferative diabetic retinopathy with macular edema, left eye: Secondary | ICD-10-CM | POA: Diagnosis not present

## 2015-04-24 ENCOUNTER — Ambulatory Visit (HOSPITAL_COMMUNITY)
Admission: RE | Admit: 2015-04-24 | Discharge: 2015-04-24 | Disposition: A | Discharge status: Home / Self Care | Payer: Medicare Other | Source: Ambulatory Visit | Attending: Gastroenterology | Admitting: Gastroenterology

## 2015-04-24 DIAGNOSIS — R932 Abnormal findings on diagnostic imaging of liver and biliary tract: Secondary | ICD-10-CM | POA: Insufficient documentation

## 2015-04-24 DIAGNOSIS — R198 Other specified symptoms and signs involving the digestive system and abdomen: Secondary | ICD-10-CM | POA: Diagnosis not present

## 2015-04-24 DIAGNOSIS — R1012 Left upper quadrant pain: Secondary | ICD-10-CM | POA: Insufficient documentation

## 2015-04-24 DIAGNOSIS — R140 Abdominal distension (gaseous): Secondary | ICD-10-CM | POA: Insufficient documentation

## 2015-04-24 DIAGNOSIS — M542 Cervicalgia: Secondary | ICD-10-CM | POA: Diagnosis not present

## 2015-04-24 DIAGNOSIS — R1011 Right upper quadrant pain: Secondary | ICD-10-CM | POA: Diagnosis not present

## 2015-04-24 DIAGNOSIS — G894 Chronic pain syndrome: Secondary | ICD-10-CM | POA: Diagnosis not present

## 2015-04-24 DIAGNOSIS — M47892 Other spondylosis, cervical region: Secondary | ICD-10-CM | POA: Diagnosis not present

## 2015-04-24 NOTE — Progress Notes (Signed)
Quick Note:  Let patient know that he has fatty liver but no large organs ie liver/spleen to explain bloating/fullness in abdomen.  He did have mild anemia and mildly low platelets in 03/2015. #1 I would like for him to try bentyl 10mg  once every am and once at bedtime for increased stools and bloating. #60, 1 refills. Hold for constipation. #2 Repeat CBC in 4 weeks.  #3 Continue Align. #4 Return to the office in 4-6 weeks to see RMR only. ______

## 2015-04-25 ENCOUNTER — Ambulatory Visit (INDEPENDENT_AMBULATORY_CARE_PROVIDER_SITE_OTHER): Payer: Medicare Other | Admitting: *Deleted

## 2015-04-25 DIAGNOSIS — Z95810 Presence of automatic (implantable) cardiac defibrillator: Secondary | ICD-10-CM

## 2015-04-25 NOTE — Progress Notes (Signed)
Pt to device clinic for Dr. Lovena Le to reevaluate hematoma at device pocket. Incision edges approximated, no redness or drainage. Swelling improved per patient, wife and Dr. Lovena Le. No further interventions necessary. Pt and wife instructed to call device clinic if pocket becomes more swollen or if redness or drainage are noted. They verbalize understanding.

## 2015-04-30 ENCOUNTER — Other Ambulatory Visit: Payer: Self-pay | Admitting: Orthopaedic Surgery

## 2015-04-30 DIAGNOSIS — M47812 Spondylosis without myelopathy or radiculopathy, cervical region: Secondary | ICD-10-CM

## 2015-05-01 ENCOUNTER — Telehealth: Payer: Self-pay | Admitting: Family Medicine

## 2015-05-01 ENCOUNTER — Encounter: Payer: Self-pay | Admitting: Internal Medicine

## 2015-05-01 ENCOUNTER — Other Ambulatory Visit: Payer: Self-pay | Admitting: Gastroenterology

## 2015-05-01 DIAGNOSIS — E119 Type 2 diabetes mellitus without complications: Secondary | ICD-10-CM

## 2015-05-01 DIAGNOSIS — D649 Anemia, unspecified: Secondary | ICD-10-CM

## 2015-05-01 MED ORDER — DICYCLOMINE HCL 10 MG PO CAPS: ORAL_CAPSULE | ORAL | Status: DC

## 2015-05-01 NOTE — Telephone Encounter (Signed)
[place] 12/29

## 2015-05-01 NOTE — Progress Notes (Signed)
APPT MADE AND LETTER SENT  °

## 2015-05-01 NOTE — Telephone Encounter (Signed)
Please go ahead with referral to new endocrinology has requested

## 2015-05-01 NOTE — Telephone Encounter (Signed)
Patient requesting referral to a new endocrinologist.  Asking for Dr. [name] If ok to refer please initiate in system so that I may process

## 2015-05-02 ENCOUNTER — Encounter: Payer: Self-pay | Admitting: Family Medicine

## 2015-05-02 DIAGNOSIS — B351 Tinea unguium: Secondary | ICD-10-CM | POA: Diagnosis not present

## 2015-05-02 DIAGNOSIS — E1142 Type 2 diabetes mellitus with diabetic polyneuropathy: Secondary | ICD-10-CM | POA: Diagnosis not present

## 2015-05-06 NOTE — Telephone Encounter (Signed)
Patient notified via letter by referral coordinator.

## 2015-05-06 NOTE — Telephone Encounter (Signed)
Order was put in. [facility] to notify pt.

## 2015-05-08 ENCOUNTER — Ambulatory Visit
Admission: RE | Admit: 2015-05-08 | Discharge: 2015-05-08 | Disposition: A | Discharge status: Home / Self Care | Payer: Medicare Other | Source: Ambulatory Visit | Attending: Orthopaedic Surgery | Admitting: Orthopaedic Surgery

## 2015-05-08 ENCOUNTER — Other Ambulatory Visit: Payer: Self-pay

## 2015-05-08 DIAGNOSIS — D649 Anemia, unspecified: Secondary | ICD-10-CM

## 2015-05-08 DIAGNOSIS — M47812 Spondylosis without myelopathy or radiculopathy, cervical region: Secondary | ICD-10-CM

## 2015-05-08 DIAGNOSIS — M4322 Fusion of spine, cervical region: Secondary | ICD-10-CM | POA: Diagnosis not present

## 2015-05-08 MED ORDER — DIAZEPAM 5 MG PO TABS
5.0000 mg | ORAL_TABLET | Freq: Once | ORAL | Status: AC
  Administered 2015-05-08: 5 mg via ORAL

## 2015-05-08 MED ORDER — IOHEXOL 300 MG/ML  SOLN
10.0000 mL | Freq: Once | INTRAMUSCULAR | Status: AC | PRN
  Administered 2015-05-08: 10 mL via INTRATHECAL

## 2015-05-08 NOTE — Discharge Instructions (Addendum)
Myelogram Discharge Instructions  1. Go home and rest quietly for the next 24 hours.  It is important to lie flat for the next 24 hours.  Get up only to go to the restroom.  You may lie in the bed or on a couch on your back, your stomach, your left side or your right side.  You may have one pillow under your head.  You may have pillows between your knees while you are on your side or under your knees while you are on your back.  2. DO NOT drive today.  Recline the seat as far back as it will go, while still wearing your seat belt, on the way home.  3. You may get up to go to the bathroom as needed.  You may sit up for 10 minutes to eat.  You may resume your normal diet and medications unless otherwise indicated.  Drink lots of extra fluids today and tomorrow.  4. The incidence of headache, nausea, or vomiting is about 5% (one in 20 patients).  If you develop a headache, lie flat and drink plenty of fluids until the headache goes away.  Caffeinated beverages may be helpful.  If you develop severe nausea and vomiting or a headache that does not go away with flat bed rest, call [phone].  5. You may resume normal activities after your 24 hours of bed rest is over; however, do not exert yourself strongly or do any heavy lifting tomorrow. If when you get up you have a headache when standing, go back to bed and force fluids for another 24 hours.  6. Call your physician for a follow-up appointment.  The results of your myelogram will be sent directly to your physician by the following day.  7. If you have any questions or if complications develop after you arrive home, please call [phone].  Discharge instructions have been explained to the patient.  The patient, or the person responsible for the patient, fully understands these instructions.       May resume Cymbalta and Trazodone on Jan. 6, 2017, after 8:30 am.

## 2015-05-08 NOTE — Progress Notes (Signed)
Pt states he has been off Cymbalta and Trazodone for the past 2 days.   Discharge instructions explained to pt.

## 2015-05-09 ENCOUNTER — Encounter: Payer: Self-pay | Admitting: Nurse Practitioner

## 2015-05-09 ENCOUNTER — Telehealth: Payer: Self-pay | Admitting: "Endocrinology

## 2015-05-09 ENCOUNTER — Encounter: Payer: Self-pay | Admitting: Family Medicine

## 2015-05-09 ENCOUNTER — Ambulatory Visit (INDEPENDENT_AMBULATORY_CARE_PROVIDER_SITE_OTHER): Payer: Medicare Other | Admitting: Nurse Practitioner

## 2015-05-09 VITALS — BP 127/82 | HR 64 | Ht 69.0 in | Wt 223.6 lb

## 2015-05-09 DIAGNOSIS — R413 Other amnesia: Secondary | ICD-10-CM

## 2015-05-09 DIAGNOSIS — E113512 Type 2 diabetes mellitus with proliferative diabetic retinopathy with macular edema, left eye: Secondary | ICD-10-CM | POA: Insufficient documentation

## 2015-05-09 MED ORDER — DONEPEZIL HCL 10 MG PO TABS
10.0000 mg | ORAL_TABLET | Freq: Every day | ORAL | Status: DC
Start: 2015-05-09 — End: 2016-01-07

## 2015-05-09 MED ORDER — INSULIN DETEMIR 100 UNIT/ML FLEXPEN: PEN_INJECTOR | SUBCUTANEOUS | Status: DC
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
## 2015-05-09 NOTE — Progress Notes (Signed)
I have read the note, and I agree with the clinical assessment and plan.  WILLIS,CHARLES KEITH   

## 2015-05-09 NOTE — Patient Instructions (Signed)
Continue Aricept at current dose will refill Follow-up in 8 months

## 2015-05-09 NOTE — Telephone Encounter (Signed)
[facility] didn't have any more refills please send to [facility].

## 2015-05-12 ENCOUNTER — Other Ambulatory Visit: Payer: Self-pay

## 2015-05-12 MED ORDER — INSULIN DETEMIR 100 UNIT/ML FLEXPEN: PEN_INJECTOR | SUBCUTANEOUS | Status: DC

## 2015-05-13 ENCOUNTER — Encounter (HOSPITAL_COMMUNITY): Payer: Medicare Other

## 2015-05-13 ENCOUNTER — Encounter: Payer: Medicare Other | Admitting: Vascular Surgery

## 2015-05-15 ENCOUNTER — Other Ambulatory Visit: Payer: Self-pay | Admitting: "Endocrinology

## 2015-05-15 DIAGNOSIS — E1159 Type 2 diabetes mellitus with other circulatory complications: Secondary | ICD-10-CM | POA: Diagnosis not present

## 2015-05-15 LAB — BASIC METABOLIC PANEL
BUN: 10 mg/dL (ref 7–25)
CO2: 30 mmol/L (ref 20–31)
Calcium: 8.9 mg/dL (ref 8.6–10.3)
Chloride: 102 mmol/L (ref 98–110)
Creat: 0.86 mg/dL (ref 0.70–1.25)
Glucose, Bld: 137 mg/dL — ABNORMAL HIGH (ref 65–99)
Potassium: 3.4 mmol/L — ABNORMAL LOW (ref 3.5–5.3)
Sodium: 142 mmol/L (ref 135–146)

## 2015-05-16 ENCOUNTER — Encounter: Payer: Self-pay | Admitting: Cardiology

## 2015-05-16 ENCOUNTER — Ambulatory Visit (INDEPENDENT_AMBULATORY_CARE_PROVIDER_SITE_OTHER): Payer: Medicare Other | Admitting: Cardiology

## 2015-05-16 ENCOUNTER — Other Ambulatory Visit: Payer: Self-pay | Admitting: Family Medicine

## 2015-05-16 VITALS — BP 112/68 | HR 63 | Ht 67.0 in | Wt 231.0 lb

## 2015-05-16 DIAGNOSIS — I2510 Atherosclerotic heart disease of native coronary artery without angina pectoris: Secondary | ICD-10-CM

## 2015-05-16 DIAGNOSIS — I10 Essential (primary) hypertension: Secondary | ICD-10-CM

## 2015-05-16 DIAGNOSIS — E785 Hyperlipidemia, unspecified: Secondary | ICD-10-CM

## 2015-05-16 LAB — HEMOGLOBIN A1C
Hgb A1c MFr Bld: 9.9 % — ABNORMAL HIGH (ref <5.7)
Mean Plasma Glucose: 237 mg/dL — ABNORMAL HIGH (ref <117)
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
## 2015-05-16 NOTE — Patient Instructions (Signed)
Your physician wants you to follow-up in: 6 months with Dr Branch You will receive a reminder letter in the mail two months in advance. If you don't receive a letter, please call our office to schedule the follow-up appointment.     Your physician recommends that you continue on your current medications as directed. Please refer to the Current Medication list given to you today.     If you need a refill on your cardiac medications before your next appointment, please call your pharmacy.     Thank you for choosing [facility] !        

## 2015-05-20 ENCOUNTER — Ambulatory Visit (INDEPENDENT_AMBULATORY_CARE_PROVIDER_SITE_OTHER): Payer: Medicare Other | Admitting: "Endocrinology

## 2015-05-20 ENCOUNTER — Encounter: Payer: Self-pay | Admitting: "Endocrinology

## 2015-05-20 VITALS — BP 131/81 | HR 66 | Ht 67.0 in | Wt 232.0 lb

## 2015-05-20 DIAGNOSIS — I2510 Atherosclerotic heart disease of native coronary artery without angina pectoris: Secondary | ICD-10-CM

## 2015-05-20 DIAGNOSIS — E1159 Type 2 diabetes mellitus with other circulatory complications: Secondary | ICD-10-CM | POA: Diagnosis not present

## 2015-05-20 DIAGNOSIS — E785 Hyperlipidemia, unspecified: Secondary | ICD-10-CM | POA: Diagnosis not present

## 2015-05-20 DIAGNOSIS — I10 Essential (primary) hypertension: Secondary | ICD-10-CM | POA: Diagnosis not present

## 2015-05-20 MED ORDER — LINAGLIPTIN 5 MG PO TABS: 5.0000 mg | ORAL_TABLET | Freq: Every day | ORAL | Status: DC

## 2015-05-20 MED ORDER — HUMALOG KWIKPEN 100 UNIT/ML ~~LOC~~ SOPN: 25.0000 [IU] | PEN_INJECTOR | Freq: Three times a day (TID) | SUBCUTANEOUS | Status: DC

## 2015-05-20 MED ORDER — INSULIN DETEMIR 100 UNIT/ML FLEXPEN: 80.0000 [IU] | PEN_INJECTOR | Freq: Every day | SUBCUTANEOUS | Status: DC

## 2015-05-20 NOTE — Telephone Encounter (Signed)
Pt discussed with Dr Dorris Fetch in office today.

## 2015-05-20 NOTE — Patient Instructions (Signed)
Advice for weight management -For most of us the best way to lose weight is by diet management. Generally speaking, diet management means restricting carbohydrate consumption to minimum possible (and to unprocessed or minimally processed complex starch) and increasing protein intake (animal or plant source), fruits, and vegetables.  -Sticking to a routine mealtime to eat 3 meals a day and avoiding unnecessary snacks is shown to have a big role in weight control.  -It is better to avoid simple carbohydrates including: Cakes, Desserts, Ice Cream, Soda (diet and regular), Sweet Tea, Candies, Chips, Cookies, Artificial Sweeteners, and "Sugar-free" Products.   -Exercise: 30 minutes a day 3-4 days a week, or 150 minutes a week. Combine stretch, strength, and aerobic activities. You may seek evaluation by your heart doctor prior to initiating exercise if you have high risk for heart disease.  -If you are interested, we can schedule a visit with Lance [name], RDN, CDE for individualized nutrition education.  
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
## 2015-05-21 ENCOUNTER — Other Ambulatory Visit: Payer: Self-pay | Admitting: "Endocrinology

## 2015-05-21 ENCOUNTER — Telehealth: Payer: Self-pay

## 2015-05-21 MED ORDER — SITAGLIPTIN PHOSPHATE 25 MG PO TABS: 25.0000 mg | ORAL_TABLET | Freq: Every day | ORAL | Status: DC

## 2015-05-21 NOTE — Telephone Encounter (Signed)
I will prescribe Januvia 25 mg by mouth every morning.

## 2015-05-21 NOTE — Telephone Encounter (Signed)
Pts insurance will not cover Tradjenta. Januvia and Onglyza are preferred.

## 2015-05-22 DIAGNOSIS — M4722 Other spondylosis with radiculopathy, cervical region: Secondary | ICD-10-CM | POA: Diagnosis not present

## 2015-05-22 DIAGNOSIS — G894 Chronic pain syndrome: Secondary | ICD-10-CM | POA: Diagnosis not present

## 2015-05-22 NOTE — Telephone Encounter (Signed)
Pt.notified

## 2015-05-27 ENCOUNTER — Ambulatory Visit: Payer: Medicare Other | Admitting: Internal Medicine

## 2015-05-30 ENCOUNTER — Telehealth: Payer: Self-pay | Admitting: *Deleted

## 2015-05-30 ENCOUNTER — Telehealth: Payer: Self-pay | Admitting: Family Medicine

## 2015-05-30 NOTE — Telephone Encounter (Signed)
More than likely it would be fine for him to stop this medication one week in advance but I highly recommend that the patient get input from Dr. branch his cardiologist. We have indicated this on the form in will fax it back to the spine specialist

## 2015-05-30 NOTE — Telephone Encounter (Signed)
Certainly I don't mind being asked but it would be best for Dr. branch the cardiologist to decide if he can stop Plavix thank you

## 2015-05-30 NOTE — Telephone Encounter (Signed)
Spine/Scoliosis specialist sent over note needing a statement to stop aspirin and plavix for seven days prior to injection, left paperwork on your side in nurses station.

## 2015-05-30 NOTE — Telephone Encounter (Signed)
Letter was faxed back asking for specialist to contact dr branch for the letter. i called pt's wife to let her know letter needs to come from dr branch. She states it was sent here because you were the one that prescribed the plavix.

## 2015-06-04 ENCOUNTER — Telehealth: Payer: Self-pay | Admitting: Internal Medicine

## 2015-06-04 ENCOUNTER — Ambulatory Visit (INDEPENDENT_AMBULATORY_CARE_PROVIDER_SITE_OTHER): Payer: Medicare Other | Admitting: "Endocrinology

## 2015-06-04 ENCOUNTER — Encounter: Payer: Self-pay | Admitting: "Endocrinology

## 2015-06-04 VITALS — BP 140/82 | HR 79 | Ht 67.0 in | Wt 229.0 lb

## 2015-06-04 DIAGNOSIS — I2510 Atherosclerotic heart disease of native coronary artery without angina pectoris: Secondary | ICD-10-CM | POA: Diagnosis not present

## 2015-06-04 DIAGNOSIS — E785 Hyperlipidemia, unspecified: Secondary | ICD-10-CM

## 2015-06-04 DIAGNOSIS — E1159 Type 2 diabetes mellitus with other circulatory complications: Secondary | ICD-10-CM

## 2015-06-04 DIAGNOSIS — I10 Essential (primary) hypertension: Secondary | ICD-10-CM | POA: Diagnosis not present

## 2015-06-04 MED ORDER — HUMALOG KWIKPEN 100 UNIT/ML ~~LOC~~ SOPN: 28.0000 [IU] | PEN_INJECTOR | Freq: Three times a day (TID) | SUBCUTANEOUS | Status: DC

## 2015-06-04 NOTE — Patient Instructions (Signed)
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
## 2015-06-04 NOTE — Telephone Encounter (Signed)
Patient states that paper was supposed [facility] to give ok for injections. There is no record of this. / tg

## 2015-06-04 NOTE — Telephone Encounter (Signed)
Pt wife states that she will have the [facility] send [place] a paper for the dr. To fill out.

## 2015-06-05 ENCOUNTER — Telehealth: Payer: Self-pay | Admitting: *Deleted

## 2015-06-05 DIAGNOSIS — D649 Anemia, unspecified: Secondary | ICD-10-CM | POA: Diagnosis not present

## 2015-06-05 LAB — CBC WITH DIFFERENTIAL/PLATELET
Basophils Absolute: 0 10*3/uL (ref 0.0–0.1)
Basophils Relative: 0 % (ref 0–1)
Eosinophils Absolute: 0.1 10*3/uL (ref 0.0–0.7)
Eosinophils Relative: 2 % (ref 0–5)
HCT: 40.4 % (ref 39.0–52.0)
Hemoglobin: 13.5 g/dL (ref 13.0–17.0)
Lymphocytes Relative: 36 % (ref 12–46)
Lymphs Abs: 2.1 10*3/uL (ref 0.7–4.0)
MCH: 27.9 pg (ref 26.0–34.0)
MCHC: 33.4 g/dL (ref 30.0–36.0)
MCV: 83.5 fL (ref 78.0–100.0)
MPV: 11.4 fL (ref 8.6–12.4)
Monocytes Absolute: 0.5 10*3/uL (ref 0.1–1.0)
Monocytes Relative: 9 % (ref 3–12)
Neutro Abs: 3 10*3/uL (ref 1.7–7.7)
Neutrophils Relative %: 53 % (ref 43–77)
Platelets: 166 10*3/uL (ref 150–400)
RBC: 4.84 MIL/uL (ref 4.22–5.81)
RDW: 14.9 % (ref 11.5–15.5)
WBC: 5.7 10*3/uL (ref 4.0–10.5)

## 2015-06-05 NOTE — Telephone Encounter (Signed)
He is more than 1 year out from his last stent, ok to hold plavix and ASA as needed for the procedure.   Zandra Abts MD

## 2015-06-05 NOTE — Telephone Encounter (Signed)
Dr[facility] Maia Petties at [facility] requesting pt hold ASA and Plavix 7 days prior to left medial branch block cervical 2,3 injection. Will forward to Dr. Harl Bowie

## 2015-06-05 NOTE — Telephone Encounter (Signed)
Will route to [place] at [facility].

## 2015-06-08 NOTE — Progress Notes (Signed)
Quick Note:  Please let patient know his cbc is normal. ______

## 2015-06-16 DIAGNOSIS — M47812 Spondylosis without myelopathy or radiculopathy, cervical region: Secondary | ICD-10-CM | POA: Diagnosis not present

## 2015-06-16 DIAGNOSIS — M47892 Other spondylosis, cervical region: Secondary | ICD-10-CM | POA: Diagnosis not present

## 2015-06-23 ENCOUNTER — Other Ambulatory Visit: Payer: Self-pay | Admitting: "Endocrinology

## 2015-06-23 ENCOUNTER — Other Ambulatory Visit: Payer: Self-pay | Admitting: Family Medicine

## 2015-07-01 ENCOUNTER — Other Ambulatory Visit: Payer: Self-pay | Admitting: Gastroenterology

## 2015-07-04 ENCOUNTER — Ambulatory Visit (INDEPENDENT_AMBULATORY_CARE_PROVIDER_SITE_OTHER): Payer: Medicare Other | Admitting: "Endocrinology

## 2015-07-04 ENCOUNTER — Encounter: Payer: Self-pay | Admitting: "Endocrinology

## 2015-07-04 VITALS — BP 139/89 | HR 67 | Ht 67.0 in | Wt 236.0 lb

## 2015-07-04 DIAGNOSIS — E785 Hyperlipidemia, unspecified: Secondary | ICD-10-CM | POA: Diagnosis not present

## 2015-07-04 DIAGNOSIS — I2510 Atherosclerotic heart disease of native coronary artery without angina pectoris: Secondary | ICD-10-CM

## 2015-07-04 DIAGNOSIS — E1159 Type 2 diabetes mellitus with other circulatory complications: Secondary | ICD-10-CM

## 2015-07-04 DIAGNOSIS — I10 Essential (primary) hypertension: Secondary | ICD-10-CM

## 2015-07-04 MED ORDER — INSULIN DETEMIR 100 UNIT/ML FLEXPEN: 90.0000 [IU] | PEN_INJECTOR | Freq: Every day | SUBCUTANEOUS | Status: DC

## 2015-07-04 MED ORDER — HUMALOG KWIKPEN 100 UNIT/ML ~~LOC~~ SOPN: 32.0000 [IU] | PEN_INJECTOR | Freq: Three times a day (TID) | SUBCUTANEOUS | Status: DC
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
## 2015-07-04 NOTE — Patient Instructions (Signed)

## 2015-07-06 ENCOUNTER — Observation Stay (HOSPITAL_COMMUNITY)
Admission: EM | Admit: 2015-07-06 | Discharge: 2015-07-09 | Disposition: A | Discharge status: Home / Self Care | Payer: Medicare Other | Attending: Family Medicine | Admitting: Family Medicine

## 2015-07-06 ENCOUNTER — Encounter (HOSPITAL_COMMUNITY): Payer: Self-pay

## 2015-07-06 DIAGNOSIS — Z981 Arthrodesis status: Secondary | ICD-10-CM | POA: Insufficient documentation

## 2015-07-06 DIAGNOSIS — Z95810 Presence of automatic (implantable) cardiac defibrillator: Secondary | ICD-10-CM | POA: Diagnosis present

## 2015-07-06 DIAGNOSIS — F419 Anxiety disorder, unspecified: Secondary | ICD-10-CM | POA: Diagnosis not present

## 2015-07-06 DIAGNOSIS — E785 Hyperlipidemia, unspecified: Secondary | ICD-10-CM | POA: Insufficient documentation

## 2015-07-06 DIAGNOSIS — I2510 Atherosclerotic heart disease of native coronary artery without angina pectoris: Secondary | ICD-10-CM | POA: Insufficient documentation

## 2015-07-06 DIAGNOSIS — I4891 Unspecified atrial fibrillation: Secondary | ICD-10-CM | POA: Diagnosis not present

## 2015-07-06 DIAGNOSIS — R0602 Shortness of breath: Secondary | ICD-10-CM

## 2015-07-06 DIAGNOSIS — Z7902 Long term (current) use of antithrombotics/antiplatelets: Secondary | ICD-10-CM | POA: Insufficient documentation

## 2015-07-06 DIAGNOSIS — Z794 Long term (current) use of insulin: Secondary | ICD-10-CM | POA: Diagnosis not present

## 2015-07-06 DIAGNOSIS — I110 Hypertensive heart disease with heart failure: Secondary | ICD-10-CM | POA: Insufficient documentation

## 2015-07-06 DIAGNOSIS — I10 Essential (primary) hypertension: Secondary | ICD-10-CM | POA: Diagnosis present

## 2015-07-06 DIAGNOSIS — Z951 Presence of aortocoronary bypass graft: Secondary | ICD-10-CM | POA: Insufficient documentation

## 2015-07-06 DIAGNOSIS — F329 Major depressive disorder, single episode, unspecified: Secondary | ICD-10-CM | POA: Diagnosis not present

## 2015-07-06 DIAGNOSIS — Z8674 Personal history of sudden cardiac arrest: Secondary | ICD-10-CM | POA: Insufficient documentation

## 2015-07-06 DIAGNOSIS — R110 Nausea: Secondary | ICD-10-CM | POA: Diagnosis not present

## 2015-07-06 DIAGNOSIS — R404 Transient alteration of awareness: Secondary | ICD-10-CM | POA: Diagnosis not present

## 2015-07-06 DIAGNOSIS — I255 Ischemic cardiomyopathy: Secondary | ICD-10-CM | POA: Insufficient documentation

## 2015-07-06 DIAGNOSIS — K219 Gastro-esophageal reflux disease without esophagitis: Secondary | ICD-10-CM | POA: Insufficient documentation

## 2015-07-06 DIAGNOSIS — N529 Male erectile dysfunction, unspecified: Secondary | ICD-10-CM | POA: Insufficient documentation

## 2015-07-06 DIAGNOSIS — I252 Old myocardial infarction: Secondary | ICD-10-CM | POA: Diagnosis not present

## 2015-07-06 DIAGNOSIS — Z87891 Personal history of nicotine dependence: Secondary | ICD-10-CM | POA: Insufficient documentation

## 2015-07-06 DIAGNOSIS — Z7982 Long term (current) use of aspirin: Secondary | ICD-10-CM | POA: Diagnosis not present

## 2015-07-06 DIAGNOSIS — Z96652 Presence of left artificial knee joint: Secondary | ICD-10-CM | POA: Insufficient documentation

## 2015-07-06 DIAGNOSIS — I959 Hypotension, unspecified: Secondary | ICD-10-CM | POA: Diagnosis not present

## 2015-07-06 DIAGNOSIS — J449 Chronic obstructive pulmonary disease, unspecified: Secondary | ICD-10-CM | POA: Diagnosis not present

## 2015-07-06 DIAGNOSIS — J45909 Unspecified asthma, uncomplicated: Secondary | ICD-10-CM | POA: Diagnosis not present

## 2015-07-06 DIAGNOSIS — Z79899 Other long term (current) drug therapy: Secondary | ICD-10-CM | POA: Diagnosis not present

## 2015-07-06 DIAGNOSIS — I5042 Chronic combined systolic (congestive) and diastolic (congestive) heart failure: Secondary | ICD-10-CM | POA: Diagnosis not present

## 2015-07-06 DIAGNOSIS — N179 Acute kidney failure, unspecified: Secondary | ICD-10-CM | POA: Insufficient documentation

## 2015-07-06 DIAGNOSIS — R55 Syncope and collapse: Secondary | ICD-10-CM | POA: Diagnosis present

## 2015-07-06 DIAGNOSIS — E1142 Type 2 diabetes mellitus with diabetic polyneuropathy: Secondary | ICD-10-CM | POA: Diagnosis not present

## 2015-07-06 DIAGNOSIS — Z8673 Personal history of transient ischemic attack (TIA), and cerebral infarction without residual deficits: Secondary | ICD-10-CM | POA: Insufficient documentation

## 2015-07-06 DIAGNOSIS — E876 Hypokalemia: Secondary | ICD-10-CM | POA: Diagnosis not present

## 2015-07-06 DIAGNOSIS — R42 Dizziness and giddiness: Secondary | ICD-10-CM | POA: Diagnosis not present

## 2015-07-06 DIAGNOSIS — E1159 Type 2 diabetes mellitus with other circulatory complications: Secondary | ICD-10-CM | POA: Diagnosis present

## 2015-07-06 DIAGNOSIS — R0682 Tachypnea, not elsewhere classified: Secondary | ICD-10-CM | POA: Diagnosis not present

## 2015-07-06 DIAGNOSIS — R001 Bradycardia, unspecified: Secondary | ICD-10-CM | POA: Diagnosis not present

## 2015-07-06 LAB — BASIC METABOLIC PANEL
Anion gap: 10 (ref 5–15)
BUN: 13 mg/dL (ref 6–20)
CO2: 27 mmol/L (ref 22–32)
Calcium: 8.8 mg/dL — ABNORMAL LOW (ref 8.9–10.3)
Chloride: 102 mmol/L (ref 101–111)
Creatinine, Ser: 1.12 mg/dL (ref 0.61–1.24)
GFR calc Af Amer: >60 mL/min (ref >60)
GFR calc non Af Amer: >60 mL/min (ref >60)
Glucose, Bld: 144 mg/dL — ABNORMAL HIGH (ref 65–99)
Potassium: 3.2 mmol/L — ABNORMAL LOW (ref 3.5–5.1)
Sodium: 139 mmol/L (ref 135–145)

## 2015-07-06 LAB — CBC
HCT: 38.1 % — ABNORMAL LOW (ref 39.0–52.0)
Hemoglobin: 12.7 g/dL — ABNORMAL LOW (ref 13.0–17.0)
MCH: 27.5 pg (ref 26.0–34.0)
MCHC: 33.3 g/dL (ref 30.0–36.0)
MCV: 82.5 fL (ref 78.0–100.0)
Platelets: 144 10*3/uL — ABNORMAL LOW (ref 150–400)
RBC: 4.62 MIL/uL (ref 4.22–5.81)
RDW: 14 % (ref 11.5–15.5)
WBC: 6.6 10*3/uL (ref 4.0–10.5)

## 2015-07-06 LAB — I-STAT TROPONIN, ED: Troponin i, poc: 0 ng/mL (ref 0.00–0.08)

## 2015-07-06 LAB — I-STAT CG4 LACTIC ACID, ED: Lactic Acid, Venous: 1.08 mmol/L (ref 0.5–2.0)

## 2015-07-06 LAB — CBG MONITORING, ED: Glucose-Capillary: 132 mg/dL — ABNORMAL HIGH (ref 65–99)

## 2015-07-06 MED ORDER — SODIUM CHLORIDE 0.9 % IV SOLN
INTRAVENOUS | Status: AC
  Administered 2015-07-06: 23:00:00 via INTRAVENOUS

## 2015-07-06 NOTE — ED Notes (Signed)
Pt laid flat in recliner and IVF bolus.
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
## 2015-07-06 NOTE — ED Notes (Signed)
Onset around 7pm pt had coughing spell, got dizzy, sat in chair for 15 min, then had to go to BR to have stool, as soon as pt stood he had LOC, fell on hardwood floor.  EMS BP 92/63.  Pt vomited x 1 en route, Zofran 4mg  given.

## 2015-07-06 NOTE — ED Notes (Signed)
Called Gerald Stabs, charge to request room, advised nurse first.

## 2015-07-06 NOTE — ED Notes (Signed)
Pt is aware he needs a urine sample 
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
## 2015-07-07 ENCOUNTER — Observation Stay (HOSPITAL_COMMUNITY): Payer: Medicare Other

## 2015-07-07 ENCOUNTER — Encounter (HOSPITAL_COMMUNITY): Payer: Self-pay

## 2015-07-07 DIAGNOSIS — I959 Hypotension, unspecified: Secondary | ICD-10-CM | POA: Diagnosis not present

## 2015-07-07 DIAGNOSIS — N179 Acute kidney failure, unspecified: Secondary | ICD-10-CM | POA: Diagnosis present

## 2015-07-07 DIAGNOSIS — I2510 Atherosclerotic heart disease of native coronary artery without angina pectoris: Secondary | ICD-10-CM | POA: Diagnosis not present

## 2015-07-07 DIAGNOSIS — I255 Ischemic cardiomyopathy: Secondary | ICD-10-CM | POA: Diagnosis not present

## 2015-07-07 DIAGNOSIS — R55 Syncope and collapse: Secondary | ICD-10-CM

## 2015-07-07 DIAGNOSIS — I110 Hypertensive heart disease with heart failure: Secondary | ICD-10-CM | POA: Diagnosis not present

## 2015-07-07 LAB — GLUCOSE, CAPILLARY
Glucose-Capillary: 181 mg/dL — ABNORMAL HIGH (ref 65–99)
Glucose-Capillary: 206 mg/dL — ABNORMAL HIGH (ref 65–99)
Glucose-Capillary: 188 mg/dL — ABNORMAL HIGH (ref 65–99)
Glucose-Capillary: 277 mg/dL — ABNORMAL HIGH (ref 65–99)
Glucose-Capillary: 193 mg/dL — ABNORMAL HIGH (ref 65–99)

## 2015-07-07 LAB — URINALYSIS, ROUTINE W REFLEX MICROSCOPIC
Bilirubin Urine: NEGATIVE
Glucose, UA: 250 mg/dL — AB
Hgb urine dipstick: NEGATIVE
Ketones, ur: NEGATIVE mg/dL
Leukocytes, UA: NEGATIVE
Nitrite: NEGATIVE
Protein, ur: NEGATIVE mg/dL
Specific Gravity, Urine: 1.011 (ref 1.005–1.030)
pH: 5.5 (ref 5.0–8.0)

## 2015-07-07 LAB — CBC
HCT: 33 % — ABNORMAL LOW (ref 39.0–52.0)
Hemoglobin: 11.4 g/dL — ABNORMAL LOW (ref 13.0–17.0)
MCH: 28.6 pg (ref 26.0–34.0)
MCHC: 34.5 g/dL (ref 30.0–36.0)
MCV: 82.7 fL (ref 78.0–100.0)
Platelets: 117 10*3/uL — ABNORMAL LOW (ref 150–400)
RBC: 3.99 MIL/uL — ABNORMAL LOW (ref 4.22–5.81)
RDW: 14.2 % (ref 11.5–15.5)
WBC: 6.1 10*3/uL (ref 4.0–10.5)

## 2015-07-07 LAB — BASIC METABOLIC PANEL
Anion gap: 7 (ref 5–15)
BUN: 14 mg/dL (ref 6–20)
CO2: 23 mmol/L (ref 22–32)
Calcium: 8 mg/dL — ABNORMAL LOW (ref 8.9–10.3)
Chloride: 106 mmol/L (ref 101–111)
Creatinine, Ser: 1.14 mg/dL (ref 0.61–1.24)
GFR calc Af Amer: >60 mL/min (ref >60)
GFR calc non Af Amer: >60 mL/min (ref >60)
Glucose, Bld: 258 mg/dL — ABNORMAL HIGH (ref 65–99)
Potassium: 4.6 mmol/L (ref 3.5–5.1)
Sodium: 136 mmol/L (ref 135–145)

## 2015-07-07 LAB — RAPID URINE DRUG SCREEN, HOSP PERFORMED
Amphetamines: NOT DETECTED
Barbiturates: NOT DETECTED
Benzodiazepines: NOT DETECTED
Cocaine: NOT DETECTED
Opiates: POSITIVE — AB
Tetrahydrocannabinol: NOT DETECTED

## 2015-07-07 LAB — MAGNESIUM: Magnesium: 1.5 mg/dL — ABNORMAL LOW (ref 1.7–2.4)

## 2015-07-07 LAB — PROTIME-INR
INR: 1.16 (ref 0.00–1.49)
INR: 1.17 (ref 0.00–1.49)
Prothrombin Time: 15 seconds (ref 11.6–15.2)
Prothrombin Time: 15.1 seconds (ref 11.6–15.2)

## 2015-07-07 LAB — BRAIN NATRIURETIC PEPTIDE: B Natriuretic Peptide: 42.5 pg/mL (ref 0.0–100.0)

## 2015-07-07 LAB — HEPATIC FUNCTION PANEL
ALT: 26 U/L (ref 17–63)
AST: 21 U/L (ref 15–41)
Albumin: 3 g/dL — ABNORMAL LOW (ref 3.5–5.0)
Alkaline Phosphatase: 82 U/L (ref 38–126)
Bilirubin, Direct: <0.1 mg/dL — ABNORMAL LOW (ref 0.1–0.5)
Total Bilirubin: 0.4 mg/dL (ref 0.3–1.2)
Total Protein: 5.8 g/dL — ABNORMAL LOW (ref 6.5–8.1)

## 2015-07-07 LAB — TROPONIN I
Troponin I: <0.03 ng/mL (ref <0.031)
Troponin I: <0.03 ng/mL (ref <0.031)
Troponin I: <0.03 ng/mL (ref <0.031)

## 2015-07-07 LAB — PHOSPHORUS: Phosphorus: 4 mg/dL (ref 2.5–4.6)

## 2015-07-07 LAB — APTT: aPTT: 26 seconds (ref 24–37)

## 2015-07-07 LAB — TSH: TSH: 1.286 u[IU]/mL (ref 0.350–4.500)

## 2015-07-07 MED ORDER — INSULIN DETEMIR 100 UNIT/ML ~~LOC~~ SOLN
60.0000 [IU] | Freq: Every day | SUBCUTANEOUS | Status: DC
Start: 2015-07-07 — End: 2015-07-08
  Administered 2015-07-07 (×2): 60 [IU] via SUBCUTANEOUS
  Filled 2015-07-07 (×3): qty 0.6

## 2015-07-07 MED ORDER — ACETAMINOPHEN 325 MG PO TABS
650.0000 mg | ORAL_TABLET | Freq: Four times a day (QID) | ORAL | Status: DC | PRN
Start: 2015-07-07 — End: 2015-07-09

## 2015-07-07 MED ORDER — ACETAMINOPHEN 650 MG RE SUPP
650.0000 mg | Freq: Four times a day (QID) | RECTAL | Status: DC | PRN
Start: 2015-07-07 — End: 2015-07-09

## 2015-07-07 MED ORDER — SODIUM CHLORIDE 0.45 % IV SOLN
INTRAVENOUS | Status: AC
  Administered 2015-07-07: 02:00:00 via INTRAVENOUS

## 2015-07-07 MED ORDER — MAGNESIUM OXIDE 400 (241.3 MG) MG PO TABS
400.0000 mg | ORAL_TABLET | Freq: Once | ORAL | Status: AC
Start: 2015-07-07 — End: 2015-07-07
  Administered 2015-07-07: 400 mg via ORAL
  Filled 2015-07-07: qty 1

## 2015-07-07 MED ORDER — CLOPIDOGREL BISULFATE 75 MG PO TABS
75.0000 mg | ORAL_TABLET | Freq: Every day | ORAL | Status: DC
  Administered 2015-07-07 – 2015-07-09 (×3): 75 mg via ORAL
  Filled 2015-07-07 (×3): qty 1

## 2015-07-07 MED ORDER — SODIUM CHLORIDE 0.9% FLUSH
3.0000 mL | Freq: Two times a day (BID) | INTRAVENOUS | Status: DC
  Administered 2015-07-07 – 2015-07-08 (×4): 3 mL via INTRAVENOUS

## 2015-07-07 MED ORDER — GABAPENTIN 300 MG PO CAPS
300.0000 mg | ORAL_CAPSULE | Freq: Two times a day (BID) | ORAL | Status: DC
  Administered 2015-07-07 – 2015-07-09 (×6): 300 mg via ORAL
  Filled 2015-07-07 (×6): qty 1

## 2015-07-07 MED ORDER — POTASSIUM CHLORIDE 20 MEQ/15ML (10%) PO SOLN
40.0000 meq | Freq: Once | ORAL | Status: AC
  Administered 2015-07-07: 40 meq via ORAL
  Filled 2015-07-07: qty 30

## 2015-07-07 MED ORDER — PANTOPRAZOLE SODIUM 40 MG PO TBEC
40.0000 mg | DELAYED_RELEASE_TABLET | Freq: Every day | ORAL | Status: DC
  Administered 2015-07-07 – 2015-07-09 (×3): 40 mg via ORAL
  Filled 2015-07-07 (×3): qty 1

## 2015-07-07 MED ORDER — DONEPEZIL HCL 10 MG PO TABS
10.0000 mg | ORAL_TABLET | Freq: Every day | ORAL | Status: DC
  Administered 2015-07-07 – 2015-07-08 (×3): 10 mg via ORAL
  Filled 2015-07-07 (×3): qty 1

## 2015-07-07 MED ORDER — DULOXETINE HCL 60 MG PO CPEP
60.0000 mg | ORAL_CAPSULE | Freq: Every day | ORAL | Status: DC
  Administered 2015-07-07 – 2015-07-09 (×3): 60 mg via ORAL
  Filled 2015-07-07 (×3): qty 1

## 2015-07-07 MED ORDER — ENOXAPARIN SODIUM 40 MG/0.4ML ~~LOC~~ SOLN
40.0000 mg | SUBCUTANEOUS | Status: DC
  Administered 2015-07-07 – 2015-07-08 (×2): 40 mg via SUBCUTANEOUS
  Filled 2015-07-07 (×2): qty 0.4

## 2015-07-07 MED ORDER — ASPIRIN 81 MG PO CHEW
81.0000 mg | CHEWABLE_TABLET | Freq: Every day | ORAL | Status: DC
Start: 2015-07-07 — End: 2015-07-09
  Administered 2015-07-07 – 2015-07-09 (×3): 81 mg via ORAL
  Filled 2015-07-07 (×3): qty 1

## 2015-07-07 MED ORDER — ATORVASTATIN CALCIUM 80 MG PO TABS
80.0000 mg | ORAL_TABLET | Freq: Every day | ORAL | Status: DC
  Administered 2015-07-07 – 2015-07-09 (×3): 80 mg via ORAL
  Filled 2015-07-07 (×3): qty 1

## 2015-07-07 MED ORDER — INSULIN ASPART 100 UNIT/ML ~~LOC~~ SOLN
0.0000 [IU] | Freq: Three times a day (TID) | SUBCUTANEOUS | Status: DC
  Administered 2015-07-07: 7 [IU] via SUBCUTANEOUS
  Administered 2015-07-07: 11 [IU] via SUBCUTANEOUS
  Administered 2015-07-07: 4 [IU] via SUBCUTANEOUS
  Administered 2015-07-08: 7 [IU] via SUBCUTANEOUS
  Administered 2015-07-08: 3 [IU] via SUBCUTANEOUS
  Administered 2015-07-09: 4 [IU] via SUBCUTANEOUS

## 2015-07-07 NOTE — Progress Notes (Signed)
Family Medicine Teaching Service Daily Progress Note Intern Pager: [phone]  Patient name: Lance [name] Medical record number: [id number] Date of birth: [birth year] Age: 70 y.o. Gender: male  Primary Care Provider: Sallee Lange, MD Consultants: EP Code Status: FULL   Pt Overview and Major Events to Date:  3/5: Patient admitted to [place] for syncopal episode   Assessment and Plan: Lance [name] is a 70 y.o. male presenting with syncopal epidose . PMH is significant for CAD with h/o remote MI, CHFrEF 45%, CABG x 4 2013, DES 1/16 placement, h/o of sudden cardiac arrest 2005 with AICD in place, HTN, HLD, h/o TIA, T2DM, AFib, and anxiety/depression.   Syncopal Episode: Patient's history and presentation sound consistent with neurocardiogenic syncope, arrhythmia / sick sinus also remain high on ddx. Would also consider orthostatic hypotension as a cause given persistent low blood pressures here. Doubt hypoglycemia as precipitating factor given reported CBGs per patient and CBG of 132 at arrival to the ED. HgB stable so symptomatic anemia ruled out as etiology. Patient does not appear to have any signs/symptoms of a infectious etiology of his syncopal event. He is afebrile, without a leukocytosis and lactic acid was WNL. Given significant cardiac history, need to investigate cardiac etiology further. EKG in ED sinus bradycardia with 1st degree AV block and flattened T waves in lateral leads compared to previous EKGs. iStat troponin 0.00. CTA neck performed in 4/15 showed approx. 40% narrowing of the right internal carotid artery. Could also consider medication as cause of syncopal event.  -admit to telemetry with continuous pulse ox  -repeat EKG in AM: essentially unchanged from previous  -trend troponins q6h x3 : negative x2 -Echo pending  -CT head ordered given fall and pt. On plavix / ASA: no acute intracranial process; will start anticoagulation prophylaxis with Lovenox  -cardiology consult in AM  for interrogation of AICD  -UA and UDS ordered  -orthostatic vitals signs ordered   Hypokalemia: K 3.2 in ED.  -Kdur 40 mEq given  -recheck BMET in AM: K 4.6   -Magnesium 1.5: ordered mag oxide 400 mg    AKI: Mild with Cr of 1.12 at admission. Baseline appears to be 0.9.  -1/2 NS at 150 cc/hr  -recheck BMET in AM: Cr 1.14   T2DM: Last HgB A1C 9.9 (1/17).  -continue home gabapentin 300 mg BID  -continue home Levemir , decreased dose to 60 mg QHS from 90 qhs -resistant SSI  -CBGs AC/HS   HTN: Home medications are amlodipine 5 mg daily, losartan 50 mg daily, and metoprolol 50 mg BID.  -hold home medications given hypotension  -would consider decreasing his blood pressure medications prior to discharge   History of TIA  -continue home Plavix 75 mg and ASA 81 mg  Atrial Fibrillation: Not currently in Afib on EKG or exam. Rate controlled. Patient reports he has never been on anticoagulation. CHADSVASC score calculated to be 7 giving him a 11.2% stroke risk per year.  -holding metoprolol given hypotension - Repeat Echo   CAD: With history of stent placement 05/2014.  -continue home  ASA/Plavix for Stroke prevention  History of Sudden Cardiac Arrest: AICD in place.  - Per plan above.   Systolic and Diastolic Heart Failure: Echo (08/2013) with EF of 45-50% and G1DD.  -repeat echo ordered  -hold home Lasix 20 mg daily given hypotension   HLD: Last lipid panel (3/15): Cholesterol 154, Triglycerides 155, HDL 31, and LDL 92.  -continue Lipitor 80 mg daily   Depression/Anxiety:  -  continue home Cymbalta 60 mg daily    FEN/GI: Heart Healthy Diet, 1/2 NS at 150 cc/hr  Prophylaxis: Lovenox   Disposition: Home pending completion of work up   Subjective:  Patient reports that he has been ambulating fine with no light-headness. Reports that he has had about 3-4 episodes similar to this in the last few years. He reports extensive h/o adjustment of his BP meds.    Objective: Temp:  [97.8 F (36.6 C)-98.6 F (37 C)] 98.3 F (36.8 C) (03/06 0537) Pulse Rate:  [53-79] 64 (03/06 0537) Resp:  [9-22] 16 (03/06 0537) BP: (79-109)/(55-71) 104/64 mmHg (03/06 0537) SpO2:  [91 %-98 %] 98 % (03/06 0537) Weight:  [235 lb 9.6 oz (106.867 kg)] 235 lb 9.6 oz (106.867 kg) (03/06 0106) Physical Exam: General: obese male lying in bed in NAD  Cardiovascular: RRR. No murmurs appreciated.  Respiratory: CTAB. Normal WOB.  Abdomen: obese, soft, NTND  Extremities: No LE edema.  Neuro: No gross deficits.   Laboratory:  Recent Labs Lab 07/06/15 2005 07/07/15 0502  WBC 6.6 6.1  HGB 12.7* 11.4*  HCT 38.1* 33.0*  PLT 144* 117*    Recent Labs Lab 07/06/15 2005 07/07/15 0152 07/07/15 0502  NA 139  --  136  K 3.2*  --  4.6  CL 102  --  106  CO2 27  --  23  BUN 13  --  14  CREATININE 1.12  --  1.14  CALCIUM 8.8*  --  8.0*  PROT  --  5.8*  --   BILITOT  --  0.4  --   ALKPHOS  --  82  --   ALT  --  26  --   AST  --  21  --   GLUCOSE 144*  --  258*   TSH 1.286 BNP 42.5 Phosphorus: 4.0  Imaging/Diagnostic Tests: X-ray Chest Pa And Lateral  07/07/2015  CLINICAL DATA:  Syncopal episode. EXAM: CHEST  2 VIEW COMPARISON:  04/01/2015 FINDINGS: Postoperative changes in the mediastinum and cervical spine. Cardiac pacemaker. Slightly shallow inspiration. Normal heart size and pulmonary vascularity. No focal airspace disease or consolidation in the lungs. No blunting of costophrenic angles. No pneumothorax. Mediastinal contours appear intact. IMPRESSION: No active cardiopulmonary disease. Electronically Signed   By: Lucienne Capers M.D.   On: 07/07/2015 04:21   Ct Head Wo Contrast  07/07/2015  CLINICAL DATA:  Initial evaluation for syncopal episodes. EXAM: CT HEAD WITHOUT CONTRAST TECHNIQUE: Contiguous axial images were obtained from the base of the skull through the vertex without intravenous contrast. COMPARISON:  Prior study from 08/07/2013. FINDINGS: Mild  atrophy with chronic small vessel ischemic disease present. Probable small remote lacunar infarct within the right thalamus. Scattered vascular calcifications within the carotid siphons and distal vertebral arteries. No acute large vessel territory infarct. No acute intracranial hemorrhage. No mass lesion, midline shift, or mass effect. No hydrocephalus. No extra-axial fluid collection. Scalp soft tissues within normal limits. No acute abnormality about the orbits. Mild mucosal thickening within the maxillary sinuses and ethmoidal air cells. Minimal mucosal thickening within the right frontal sinus is well. Right mastoid effusion, similar to prior, likely chronic. Middle ear cavities grossly clear. Calvarium intact. IMPRESSION: 1. No acute intracranial process. 2. Stable atrophy with mild chronic small vessel ischemic disease. 3. Chronic right mastoid effusion, stable. Electronically Signed   By: Jeannine Boga M.D.   On: 07/07/2015 04:21    Nicolette Bang, DO 07/07/2015, 7:08 AM PGY-1, [place]  [place] Intern pager: [phone], text pages welcome

## 2015-07-07 NOTE — Care Management Obs Status (Signed)
[place] NOTIFICATION   Patient Details  Name: Lance [name] MRN: [id number] Date of Birth: 1945/05/23   Medicare Observation Status Notification Given:  Yes    Royston Bake, RN 07/07/2015, 2:49 PM

## 2015-07-07 NOTE — Discharge Summary (Signed)
[facility] Discharge Summary  Patient name: Lance [name] Medical record number: [id number] Date of birth: 05/05/1945 Age: 70 y.o. Gender: male Date of Admission: 07/06/2015  Date of Discharge: 07/09/2015  Admitting Physician: Alveda Reasons, MD  Primary Care Provider: Sallee Lange, MD Consultants: Cardiology  Indication for Hospitalization: Syncope  Discharge Diagnoses/Problem List:  Patient Active Problem List   Diagnosis Date Noted  . Cardiac syncope 07/07/2015  . Arterial hypotension   . AKI (acute kidney injury) ([place])   . Syncope 07/06/2015  . Proliferative diabetic retinopathy of left eye with macular edema associated with type 2 diabetes mellitus 05/09/2015  . Abdominal bloating 04/18/2015  . RUQ fullness 04/18/2015  . LUQ pain 04/18/2015  . ICD (implantable cardioverter-defibrillator) malfunction 03/31/2015  . Diabetic peripheral neuropathy ([place]) 09/06/2014  . Unstable angina ([place]) 05/31/2014  . S/P DES to SVG-PDA 05/30/14 05/31/2014  . S/P CABG x 4 Sept 2013 05/30/2014  . DOE (dyspnea on exertion) 05/13/2014  . Memory deficits 09/05/2013  . Dizziness 08/07/2013  . ICD (implantable cardioverter-defibrillator) in place - St [name] 08/07/2013  . Ventricular fibrillation ([place]) 02/28/2013  . Cardiomyopathy, ischemic-EF 35-40% 02/09/2012  . Congestive heart failure ([place]) 02/09/2012  . Diarrhea 12/24/2011  . Dyspnea 12/06/2011  . H/O TIA (transient ischemic attack) 12/01/2011  . Encounter for servicing of automatic implantable cardioverter-defibrillator (AICD) at end of battery life 10/05/2011  . ASTHMA, MILD 04/02/2009  . Dyslipidemia 03/26/2009  . TOBACCO ABUSE 03/26/2009  . ATHEROSCLEROTIC CARDIOVASCULAR DISEASE 03/26/2009  . HYPOTENSION, ORTHOSTATIC 01/07/2009  . SYNCOPE 01/07/2009  . PITUITARY INSUFFICIENCY 12/07/2007  . ERECTILE DYSFUNCTION, ORGANIC 12/07/2007  . HYPOGONADISM, MALE 03/06/2007  . Type 2 diabetes mellitus with  vascular disease ([place]) 01/12/2007  . DEPRESSION 01/12/2007  . Essential hypertension 01/12/2007  . ALLERGIC RHINITIS 01/12/2007  . GERD 01/12/2007  . BENIGN PROSTATIC HYPERTROPHY 01/12/2007    Disposition: Home   Discharge Condition: Stable   Discharge Exam:  General: obese male lying in bed in NAD  Cardiovascular: RRR. No murmurs appreciated.  Respiratory: CTAB. Normal WOB.  Abdomen: obese, soft, NTND  Extremities: No LE edema.  Neuro: No gross deficits.    Brief Hospital Course:  Lance [name] is 70 y.o. male with extensive cardiac history presented with syncopal episode. Patient did have prodrome with his syncope so potentially neurocardiogenic in nature. Given cardiac history also high concern for an arrhythmia.   Patient was hypotensive and bradycardic at admission. He was started on IVFs and his home anti-hypertensive agents were held. CT head was obtained given fall and LOC. It was negative for an acute process. EKG in the ED was significant for sinus bradycardia with 1st degree AV block. Troponins were obtained and negative x3. Echo was obtained with LVEF of 50 to 55% and no evidence of aortic stenosis. Orthostatic vital signs were negative.   Cardiology was consulted. AICD was interrogated and was without VTach/VFib and no shock-able events. At the time of cardiology consult, patient was hypertensive and no longer bradycardic. They recommended re-starting his home blood pressure medications at lower doses. Patient was able to ambulate throughout hospital stay without dizziness.   Issues for Follow Up:  1. Although ICD did not capture any episodes of VTach/VFib, would consider other arrhythmias not captured by ICD. Could consider loop recorder by cardiology outpatient. I have asked him to consult with his primary care provider and his outpatient cardiologist regarding this. His symptoms and history prior to his syncopal event are very concerning  for arrhythmia and not sure  they are all captured by ICD. 2. Anti-hypertensive medication regimen was adjusted at discharge. Please ensure this is adequately controlling his blood pressure. New regimen: Metoprolol 25 mg BID, Losartan 25 mg, and Amlodipine 2.5 mg.  3. Lasix was held at time of discharge. Please assess his fluid status.    Significant Procedures: None   Significant Labs and Imaging:   Recent Labs Lab 07/07/15 0502 07/08/15 0824 07/09/15 0440  WBC 6.1 4.9 5.4  HGB 11.4* 12.0* 12.5*  HCT 33.0* 34.5* 37.4*  PLT 117* 106* 121*    Recent Labs Lab 07/06/15 2005 07/07/15 0152 07/07/15 0502 07/08/15 0824 07/09/15 0440  NA 139  --  136 141 138  K 3.2*  --  4.6 3.8 3.8  CL 102  --  106 105 101  CO2 27  --  23 29 27   GLUCOSE 144*  --  258* 127* 215*  BUN 13  --  14 6 11   CREATININE 1.12  --  1.14 0.96 0.95  CALCIUM 8.8*  --  8.0* 8.8* 9.4  MG  --  1.5*  --  1.7  --   PHOS  --  4.0  --   --   --   ALKPHOS  --  82  --   --   --   AST  --  21  --   --   --   ALT  --  26  --   --   --   ALBUMIN  --  3.0*  --   --   --     Results/Tests Pending at Time of Discharge: None   Discharge Medications:    Medication List    STOP taking these medications        furosemide 20 MG tablet  Commonly known as:  LASIX      TAKE these medications        albuterol 108 (90 Base) MCG/ACT inhaler  Commonly known as:  PROVENTIL HFA;VENTOLIN HFA  Inhale 2 puffs into the lungs every 6 (six) hours as needed for wheezing.     albuterol (2.5 MG/3ML) 0.083% nebulizer solution  Commonly known as:  PROVENTIL  Take 3 mLs (2.5 mg total) by nebulization every 6 (six) hours as needed for wheezing or shortness of breath.     ALIGN PO  Take 1 tablet by mouth daily.     ALPRAZolam 1 MG tablet  Commonly known as:  XANAX  TAKE 1/2 TO ONE TABLET TWICE DAILY AS NEEDED     amLODipine 2.5 MG tablet  Commonly known as:  NORVASC  Take 1 tablet (2.5 mg total) by mouth daily.     aspirin 81 MG tablet  Take 81 mg by  mouth daily.     atorvastatin 80 MG tablet  Commonly known as:  LIPITOR  Take 80 mg by mouth daily.     clopidogrel 75 MG tablet  Commonly known as:  PLAVIX  TAKE ONE TABLET EVERY MORNING WITH BREAKFAST     DEXILANT 60 MG capsule  Generic drug:  dexlansoprazole  Take 60 mg by mouth 2 (two) times daily.     dicyclomine 10 MG capsule  Commonly known as:  BENTYL  TAKE ONE CAPSULE EVERY MORNING AND ONE CAPSULE AT BEDTIME AS NEEDED FOR INCREASED STOOLS AND BLOATING     donepezil 10 MG tablet  Commonly known as:  ARICEPT  Take 1 tablet (10 mg total) by mouth at bedtime.  DULoxetine 60 MG capsule  Commonly known as:  CYMBALTA  TAKE ONE (1) CAPSULE EACH DAY     fexofenadine 180 MG tablet  Commonly known as:  ALLEGRA  Take 180 mg by mouth daily as needed.     gabapentin 300 MG capsule  Commonly known as:  NEURONTIN  Take one to two capsules po tid prn     HUMALOG KWIKPEN 100 UNIT/ML KiwkPen  Generic drug:  insulin lispro  Inject 0.32-0.38 mLs (32-38 Units total) into the skin 3 (three) times daily.     HYDROcodone-acetaminophen 7.5-325 MG tablet  Commonly known as:  NORCO  Take 1 tablet by mouth every 6 (six) hours as needed for moderate pain.     Insulin Detemir 100 UNIT/ML Pen  Commonly known as:  LEVEMIR FLEXTOUCH  Inject 90 Units into the skin daily at 10 pm. 80 units qhs     Insulin Pen Needle 31G X 6 MM Misc  Use as directed 4 x daily     losartan 25 MG tablet  Commonly known as:  COZAAR  Take 1 tablet (25 mg total) by mouth daily.     metoprolol tartrate 25 MG tablet  Commonly known as:  LOPRESSOR  Take 1 tablet (25 mg total) by mouth 2 (two) times daily.     nitroGLYCERIN 0.4 MG SL tablet  Commonly known as:  NITROSTAT  Place 1 tablet (0.4 mg total) under the tongue every 5 (five) minutes as needed. For chest pain     ONE TOUCH ULTRA TEST test strip  Generic drug:  glucose blood  USE TO TEST BLOOD SUGAR FOUR TIMES DAILY     sildenafil 20 MG tablet   Commonly known as:  REVATIO  Take 3 -5 tabs as needed for erectile dysfunction     sitaGLIPtin 25 MG tablet  Commonly known as:  JANUVIA  Take 1 tablet (25 mg total) by mouth daily.     traZODone 100 MG tablet  Commonly known as:  DESYREL  TAKE ONE-HALF TO ONE TABLET AT BEDTIME AS NEEDED FOR SLEEP     vitamin B-12 1000 MCG tablet  Commonly known as:  CYANOCOBALAMIN  Take 1,000 mcg by mouth daily.     Vitamin D (Ergocalciferol) 50000 units Caps capsule  Commonly known as:  DRISDOL  TAKE ONE CAPSULE BY MOUTH ONCE A WEEK        Discharge Instructions: Please refer to Patient Instructions section of EMR for full details.  Patient was counseled important signs and symptoms that should prompt return to medical care, changes in medications, dietary instructions, activity restrictions, and follow up appointments.   Follow-Up Appointments: Follow-up Information    Follow up with Sallee Lange, MD. Go on 07/16/2015.   Specialty:  Family Medicine   Why:  For Hospital Followup @2 ;00pm   Contact information:   [address] [place] [address] [phone]       Follow up with Jory Sims, NP On 07/24/2015.   Specialties:  Nurse Practitioner, Radiology, Cardiology   Why:  at 1:50pm.   Contact information:   [place] [address] [phone]       Nicolette Bang, DO 07/09/2015, 2:20 PM PGY-1, [place] Teaching Service  Discharge Note : Attending Dorcas Mcmurray MD Pager [phone] Office [phone] I have seen and examined this patient, reviewed their chart and discussed discharge planning with the resident at the time of discharge. I agree with the  discharge plan as above.

## 2015-07-07 NOTE — Progress Notes (Signed)
Pt has been ambulating in hallway this shift, pt refused bed alarm.

## 2015-07-08 ENCOUNTER — Ambulatory Visit (HOSPITAL_BASED_OUTPATIENT_CLINIC_OR_DEPARTMENT_OTHER): Payer: Medicare Other

## 2015-07-08 DIAGNOSIS — I952 Hypotension due to drugs: Secondary | ICD-10-CM

## 2015-07-08 DIAGNOSIS — N179 Acute kidney failure, unspecified: Secondary | ICD-10-CM | POA: Diagnosis not present

## 2015-07-08 DIAGNOSIS — I255 Ischemic cardiomyopathy: Secondary | ICD-10-CM

## 2015-07-08 DIAGNOSIS — Z95810 Presence of automatic (implantable) cardiac defibrillator: Secondary | ICD-10-CM | POA: Diagnosis not present

## 2015-07-08 DIAGNOSIS — R55 Syncope and collapse: Secondary | ICD-10-CM

## 2015-07-08 DIAGNOSIS — I10 Essential (primary) hypertension: Secondary | ICD-10-CM

## 2015-07-08 LAB — CBC
HCT: 34.5 % — ABNORMAL LOW (ref 39.0–52.0)
Hemoglobin: 12 g/dL — ABNORMAL LOW (ref 13.0–17.0)
MCH: 28.7 pg (ref 26.0–34.0)
MCHC: 34.8 g/dL (ref 30.0–36.0)
MCV: 82.5 fL (ref 78.0–100.0)
Platelets: 106 10*3/uL — ABNORMAL LOW (ref 150–400)
RBC: 4.18 MIL/uL — ABNORMAL LOW (ref 4.22–5.81)
RDW: 14.5 % (ref 11.5–15.5)
WBC: 4.9 10*3/uL (ref 4.0–10.5)

## 2015-07-08 LAB — GLUCOSE, CAPILLARY
Glucose-Capillary: 149 mg/dL — ABNORMAL HIGH (ref 65–99)
Glucose-Capillary: 118 mg/dL — ABNORMAL HIGH (ref 65–99)
Glucose-Capillary: 222 mg/dL — ABNORMAL HIGH (ref 65–99)
Glucose-Capillary: 287 mg/dL — ABNORMAL HIGH (ref 65–99)

## 2015-07-08 LAB — BASIC METABOLIC PANEL
Anion gap: 7 (ref 5–15)
BUN: 6 mg/dL (ref 6–20)
CO2: 29 mmol/L (ref 22–32)
Calcium: 8.8 mg/dL — ABNORMAL LOW (ref 8.9–10.3)
Chloride: 105 mmol/L (ref 101–111)
Creatinine, Ser: 0.96 mg/dL (ref 0.61–1.24)
GFR calc Af Amer: >60 mL/min (ref >60)
GFR calc non Af Amer: >60 mL/min (ref >60)
Glucose, Bld: 127 mg/dL — ABNORMAL HIGH (ref 65–99)
Potassium: 3.8 mmol/L (ref 3.5–5.1)
Sodium: 141 mmol/L (ref 135–145)

## 2015-07-08 LAB — MAGNESIUM: Magnesium: 1.7 mg/dL (ref 1.7–2.4)

## 2015-07-08 MED ORDER — METOPROLOL TARTRATE 25 MG PO TABS
25.0000 mg | ORAL_TABLET | Freq: Two times a day (BID) | ORAL | Status: DC
  Administered 2015-07-09: 25 mg via ORAL
  Filled 2015-07-08: qty 1

## 2015-07-08 MED ORDER — INSULIN DETEMIR 100 UNIT/ML ~~LOC~~ SOLN
70.0000 [IU] | Freq: Every day | SUBCUTANEOUS | Status: DC
  Administered 2015-07-08: 70 [IU] via SUBCUTANEOUS
  Filled 2015-07-08 (×2): qty 0.7

## 2015-07-08 MED ORDER — METOPROLOL TARTRATE 25 MG PO TABS: 25.0000 mg | ORAL_TABLET | Freq: Two times a day (BID) | ORAL | Status: DC

## 2015-07-08 MED ORDER — POTASSIUM CHLORIDE CRYS ER 20 MEQ PO TBCR
40.0000 meq | EXTENDED_RELEASE_TABLET | Freq: Once | ORAL | Status: AC
  Administered 2015-07-08: 40 meq via ORAL
  Filled 2015-07-08: qty 2

## 2015-07-08 MED ORDER — ALPRAZOLAM 0.5 MG PO TABS
0.5000 mg | ORAL_TABLET | Freq: Two times a day (BID) | ORAL | Status: DC | PRN
  Administered 2015-07-08 (×2): 0.5 mg via ORAL
  Filled 2015-07-08 (×2): qty 1

## 2015-07-08 MED ORDER — LOSARTAN POTASSIUM 25 MG PO TABS: 25.0000 mg | ORAL_TABLET | Freq: Every day | ORAL | Status: DC

## 2015-07-08 MED ORDER — LOSARTAN POTASSIUM 25 MG PO TABS
25.0000 mg | ORAL_TABLET | Freq: Every day | ORAL | Status: DC
  Administered 2015-07-08 – 2015-07-09 (×2): 25 mg via ORAL
  Filled 2015-07-08 (×2): qty 1

## 2015-07-08 MED ORDER — HYDRALAZINE HCL 20 MG/ML IJ SOLN
5.0000 mg | Freq: Once | INTRAMUSCULAR | Status: AC
  Administered 2015-07-08: 5 mg via INTRAVENOUS
  Filled 2015-07-08: qty 1

## 2015-07-08 MED ORDER — AMLODIPINE BESYLATE 2.5 MG PO TABS
2.5000 mg | ORAL_TABLET | Freq: Once | ORAL | Status: AC
  Administered 2015-07-08: 2.5 mg via ORAL
  Filled 2015-07-08: qty 1

## 2015-07-08 MED ORDER — AMLODIPINE BESYLATE 2.5 MG PO TABS: 2.5000 mg | ORAL_TABLET | Freq: Every day | ORAL | Status: DC

## 2015-07-08 MED ORDER — METOPROLOL TARTRATE 25 MG PO TABS
25.0000 mg | ORAL_TABLET | Freq: Once | ORAL | Status: AC
  Administered 2015-07-08: 25 mg via ORAL
  Filled 2015-07-08: qty 1

## 2015-07-08 MED ORDER — AMLODIPINE BESYLATE 2.5 MG PO TABS
2.5000 mg | ORAL_TABLET | Freq: Every day | ORAL | Status: DC
  Administered 2015-07-09: 2.5 mg via ORAL
  Filled 2015-07-08: qty 1

## 2015-07-08 NOTE — Progress Notes (Signed)
Family Medicine Teaching Service Daily Progress Note Intern Pager: [phone]  Patient name: Lance [name] Medical record number: [id number] Date of birth: Feb 19, 1946 Age: 70 y.o. Gender: male  Primary Care Provider: Sallee Lange, MD Consultants: EP Code Status: FULL   Pt Overview and Major Events to Date:  3/5: Patient admitted to [facility] for syncopal episode   Assessment and Plan: BRAX BITTENBENDER is a 70 y.o. male presenting with syncopal epidose . PMH is significant for CAD with h/o remote MI, CHFrEF 45%, CABG x 4 2013, DES 1/16 placement, h/o of sudden cardiac arrest 2005 with AICD in place, HTN, HLD, h/o TIA, T2DM, AFib, and anxiety/depression.   Syncopal Episode: Patient's history and presentation sound consistent with neurocardiogenic syncope, arrhythmia / sick sinus also remain high on ddx. Would also consider orthostatic hypotension as a cause given persistent low blood pressures here. Doubt hypoglycemia as precipitating factor given reported CBGs per patient and CBG of 132 at arrival to the ED. HgB stable so symptomatic anemia ruled out as etiology. Patient does not appear to have any signs/symptoms of a infectious etiology of his syncopal event. He is afebrile, without a leukocytosis and lactic acid was WNL. Given significant cardiac history, need to investigate cardiac etiology further. EKG in ED sinus bradycardia with 1st degree AV block and flattened T waves in lateral leads compared to previous EKGs. Troponins negative x3. CTA neck performed in 4/15 showed approx. 40% narrowing of the right internal carotid artery. Could also consider medication as cause of syncopal event. CT head negative.  -admit to telemetry with continuous pulse ox  -Echo pending  -cardiology consult and interrogation of AICD needed -orthostatic vitals signs ordered and negative   Hypokalemia:S/p Kdur 83mEq. K on 3/6 normalized to 4.6. Magnesium low at 1.5 and repletion with mag oxide given.  -recheck BMET and  Mg (1.7)  -K 3.8, given cardiac history and potential symptoms related to cardiac system will replete with Kdur 40 meq   AKI, resolved: Mild with Cr of 1.12 at admission. Baseline appears to be 0.9.   -recheck BMET in AM (Cr 0.96)   T2DM: Last HgB A1C 9.9 (1/17).  -continue home gabapentin 300 mg BID  -continue home Levemir , decreased dose to 60 mg QHS from 90 qhs -could increase Levemir, has required 25 units of Novolog in the past 24 hours  -resistant SSI  -CBGs AC/HS   HTN: Home medications are amlodipine 5 mg daily, losartan 50 mg daily, and metoprolol 50 mg BID. BPs overnight 134-147/74-84.  -hold home medications given hypotension at admission  -would consider decreasing his blood pressure medications prior to discharge   History of TIA  -continue home Plavix 75 mg and ASA 81 mg  Atrial Fibrillation: Not currently in Afib on EKG or exam. Rate controlled. Patient reports he has never been on anticoagulation. CHADSVASC score calculated to be 7 giving him a 11.2% stroke risk per year.  -holding metoprolol given hypotension - Repeat Echo   CAD: With history of stent placement 05/2014.  -continue home  ASA/Plavix for Stroke prevention  History of Sudden Cardiac Arrest: AICD in place.  - Per plan above.   Systolic and Diastolic Heart Failure: Echo (08/2013) with EF of 45-50% and G1DD.  -repeat echo ordered  -hold home Lasix 20 mg daily given hypotension   HLD: Last lipid panel (3/15): Cholesterol 154, Triglycerides 155, HDL 31, and LDL 92.  -continue Lipitor 80 mg daily   Depression/Anxiety:  -continue home Cymbalta 60 mg daily  FEN/GI: Heart Healthy Diet, SLIV  Prophylaxis: Lovenox   Disposition: Home pending completion of work up   Subjective:  Patient reports that he has been ambulating fine with no light-headness. Is anxious to go home.   Objective: Temp:  [98.7 F (37.1 C)-98.9 F (37.2 C)] 98.7 F (37.1 C) (03/07 0505) Pulse Rate:  [66-79] 66  (03/07 0505) Resp:  [17-20] 19 (03/07 0505) BP: (114-147)/(68-84) 147/84 mmHg (03/07 0505) SpO2:  [93 %-98 %] 98 % (03/07 0505) Weight:  [234 lb 1.6 oz (106.187 kg)] 234 lb 1.6 oz (106.187 kg) (03/07 0505) Physical Exam: General: obese male lying in bed in NAD  Cardiovascular: RRR. No murmurs appreciated.  Respiratory: CTAB. Normal WOB.  Abdomen: obese, soft, NTND  Extremities: No LE edema.  Neuro: No gross deficits.   Laboratory:  Recent Labs Lab 07/06/15 2005 07/07/15 0502  WBC 6.6 6.1  HGB 12.7* 11.4*  HCT 38.1* 33.0*  PLT 144* 117*    Recent Labs Lab 07/06/15 2005 07/07/15 0152 07/07/15 0502  NA 139  --  136  K 3.2*  --  4.6  CL 102  --  106  CO2 27  --  23  BUN 13  --  14  CREATININE 1.12  --  1.14  CALCIUM 8.8*  --  8.0*  PROT  --  5.8*  --   BILITOT  --  0.4  --   ALKPHOS  --  82  --   ALT  --  26  --   AST  --  21  --   GLUCOSE 144*  --  258*   TSH 1.286 BNP 42.5 Phosphorus: 4.0  Imaging/Diagnostic Tests: X-ray Chest Pa And Lateral  07/07/2015  CLINICAL DATA:  Syncopal episode. EXAM: CHEST  2 VIEW COMPARISON:  04/01/2015 FINDINGS: Postoperative changes in the mediastinum and cervical spine. Cardiac pacemaker. Slightly shallow inspiration. Normal heart size and pulmonary vascularity. No focal airspace disease or consolidation in the lungs. No blunting of costophrenic angles. No pneumothorax. Mediastinal contours appear intact. IMPRESSION: No active cardiopulmonary disease. Electronically Signed   By: Lucienne Capers M.D.   On: 07/07/2015 04:21   Ct Head Wo Contrast  07/07/2015  CLINICAL DATA:  Initial evaluation for syncopal episodes. EXAM: CT HEAD WITHOUT CONTRAST TECHNIQUE: Contiguous axial images were obtained from the base of the skull through the vertex without intravenous contrast. COMPARISON:  Prior study from 08/07/2013. FINDINGS: Mild atrophy with chronic small vessel ischemic disease present. Probable small remote lacunar infarct within the right  thalamus. Scattered vascular calcifications within the carotid siphons and distal vertebral arteries. No acute large vessel territory infarct. No acute intracranial hemorrhage. No mass lesion, midline shift, or mass effect. No hydrocephalus. No extra-axial fluid collection. Scalp soft tissues within normal limits. No acute abnormality about the orbits. Mild mucosal thickening within the maxillary sinuses and ethmoidal air cells. Minimal mucosal thickening within the right frontal sinus is well. Right mastoid effusion, similar to prior, likely chronic. Middle ear cavities grossly clear. Calvarium intact. IMPRESSION: 1. No acute intracranial process. 2. Stable atrophy with mild chronic small vessel ischemic disease. 3. Chronic right mastoid effusion, stable. Electronically Signed   By: Jeannine Boga M.D.   On: 07/07/2015 04:21    Nicolette Bang, DO 07/08/2015, 7:25 AM PGY-1, [place] Intern pager: [phone], text pages welcome
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
## 2015-07-08 NOTE — Progress Notes (Signed)
  Echocardiogram 2D Echocardiogram has been performed.  Jennette Dubin 07/08/2015, 12:44 PM

## 2015-07-09 ENCOUNTER — Encounter (HOSPITAL_COMMUNITY): Payer: Self-pay | Admitting: Physician Assistant

## 2015-07-09 DIAGNOSIS — E1159 Type 2 diabetes mellitus with other circulatory complications: Secondary | ICD-10-CM

## 2015-07-09 DIAGNOSIS — I10 Essential (primary) hypertension: Secondary | ICD-10-CM | POA: Diagnosis not present

## 2015-07-09 DIAGNOSIS — Z95810 Presence of automatic (implantable) cardiac defibrillator: Secondary | ICD-10-CM | POA: Diagnosis not present

## 2015-07-09 DIAGNOSIS — R0602 Shortness of breath: Secondary | ICD-10-CM | POA: Insufficient documentation

## 2015-07-09 DIAGNOSIS — I952 Hypotension due to drugs: Secondary | ICD-10-CM | POA: Diagnosis not present

## 2015-07-09 DIAGNOSIS — R55 Syncope and collapse: Secondary | ICD-10-CM | POA: Diagnosis not present

## 2015-07-09 LAB — CBC
HCT: 37.4 % — ABNORMAL LOW (ref 39.0–52.0)
Hemoglobin: 12.5 g/dL — ABNORMAL LOW (ref 13.0–17.0)
MCH: 27.7 pg (ref 26.0–34.0)
MCHC: 33.4 g/dL (ref 30.0–36.0)
MCV: 82.7 fL (ref 78.0–100.0)
Platelets: 121 10*3/uL — ABNORMAL LOW (ref 150–400)
RBC: 4.52 MIL/uL (ref 4.22–5.81)
RDW: 14.1 % (ref 11.5–15.5)
WBC: 5.4 10*3/uL (ref 4.0–10.5)

## 2015-07-09 LAB — BASIC METABOLIC PANEL
Anion gap: 10 (ref 5–15)
BUN: 11 mg/dL (ref 6–20)
CO2: 27 mmol/L (ref 22–32)
Calcium: 9.4 mg/dL (ref 8.9–10.3)
Chloride: 101 mmol/L (ref 101–111)
Creatinine, Ser: 0.95 mg/dL (ref 0.61–1.24)
GFR calc Af Amer: >60 mL/min (ref >60)
GFR calc non Af Amer: >60 mL/min (ref >60)
Glucose, Bld: 215 mg/dL — ABNORMAL HIGH (ref 65–99)
Potassium: 3.8 mmol/L (ref 3.5–5.1)
Sodium: 138 mmol/L (ref 135–145)

## 2015-07-09 LAB — GLUCOSE, CAPILLARY: Glucose-Capillary: 166 mg/dL — ABNORMAL HIGH (ref 65–99)

## 2015-07-09 MED ORDER — AMLODIPINE BESYLATE 2.5 MG PO TABS: 2.5000 mg | ORAL_TABLET | Freq: Every day | ORAL | Status: DC

## 2015-07-09 MED ORDER — METOPROLOL TARTRATE 25 MG PO TABS: 25.0000 mg | ORAL_TABLET | Freq: Two times a day (BID) | ORAL | Status: DC

## 2015-07-09 MED ORDER — LOSARTAN POTASSIUM 25 MG PO TABS: 25.0000 mg | ORAL_TABLET | Freq: Every day | ORAL | Status: DC

## 2015-07-09 NOTE — Progress Notes (Signed)
Reviewed all discharge instructions with patient.  He stated understanding of discharge meds, prescriptions alresdy sent to pharmacy, follow-up appointments, and when to call MD or 911 for symptoms.  No voiced complaints at discharge.

## 2015-07-09 NOTE — Progress Notes (Signed)
Patient ID: Lance [name], male   DOB: [birth year], 70 y.o.   MRN: [id number]    Subjective:  Denies SSCP, palpitations or Dyspnea Ambulating with no issues   Objective:  Filed Vitals:   07/08/15 1448 07/08/15 1959 07/08/15 2300 07/09/15 0530  BP: 174/89 155/95 146/85 150/86  Pulse: 72 91  71  Temp:  98.9 F (37.2 C)  98.7 F (37.1 C)  TempSrc:  Oral  Oral  Resp:  18  18  Height:      Weight:    102.649 kg (226 lb 4.8 oz)  SpO2:  98%  98%    Intake/Output from previous day:  Intake/Output Summary (Last 24 hours) at 07/09/15 0740 Last data filed at 07/09/15 [id number]  Gross per 24 hour  Intake   1080 ml  Output   1902 ml  Net   -822 ml    Physical Exam: Affect appropriate Healthy:  appears stated age HEENT: normal Neck supple with no adenopathy JVP normal no bruits no thyromegaly Lungs clear with no wheezing and good diaphragmatic motion Heart:  S1/S2 no murmur, no rub, gallop or click AICD under left clavicle  PMI normal Abdomen: benighn, BS positve, no tenderness, no AAA no bruit.  No HSM or HJR Distal pulses intact with no bruits No edema Neuro non-focal Skin warm and dry No muscular weakness   Lab Results: Basic Metabolic Panel:  Recent Labs  07/07/15 0152  07/08/15 0824 07/09/15 0440  NA  --   < > 141 138  K  --   < > 3.8 3.8  CL  --   < > 105 101  CO2  --   < > 29 27  GLUCOSE  --   < > 127* 215*  BUN  --   < > 6 11  CREATININE  --   < > 0.96 0.95  CALCIUM  --   < > 8.8* 9.4  MG 1.5*  --  1.7  --   PHOS 4.0  --   --   --   < > = values in this interval not displayed. Liver Function Tests:  Recent Labs  07/07/15 0152  AST 21  ALT 26  ALKPHOS 82  BILITOT 0.4  PROT 5.8*  ALBUMIN 3.0*   CBC:  Recent Labs  07/08/15 0824 07/09/15 0440  WBC 4.9 5.4  HGB 12.0* 12.5*  HCT 34.5* 37.4*  MCV 82.5 82.7  PLT 106* 121*   Cardiac Enzymes:  Recent Labs  07/07/15 0152 07/07/15 0502 07/07/15 1321  TROPONINI <0.03 <0.03 <0.03   Thyroid  Function Tests:  Recent Labs  07/07/15 0202  TSH 1.286    Imaging: No results found.  Cardiac Studies:  ECG:  SR rate 63 first degree no acute ST changes    Telemetry:  NSR no arrhythmia   Echo: EF 50-55%   Medications:   . amLODipine  2.5 mg Oral Daily  . aspirin  81 mg Oral Daily  . atorvastatin  80 mg Oral Daily  . clopidogrel  75 mg Oral Daily  . donepezil  10 mg Oral QHS  . DULoxetine  60 mg Oral Daily  . enoxaparin (LOVENOX) injection  40 mg Subcutaneous Q24H  . gabapentin  300 mg Oral BID  . insulin aspart  0-20 Units Subcutaneous TID WC  . insulin detemir  70 Units Subcutaneous QHS  . losartan  25 mg Oral Daily  . metoprolol  25 mg Oral BID  . pantoprazole  40  mg Oral Daily  . sodium chloride flush  3 mL Intravenous Q12H       Assessment/Plan:  Syncope:  No cardiac etiology AICD interrogation benign with no Rx delivered.  Decrease meds as indicated on consult note D/C home today outpatient f/u with Dr Harl Bowie in [place]. F/U with primary regardig anxiety and issues related to sons suicide  Jenkins Rouge 07/09/2015, 7:40 AM

## 2015-07-09 NOTE — Discharge Instructions (Signed)
You were hospitalized after a syncopal (passing out) episode and your blood pressure was low at admission. Your blood pressure has since improved and we have started you back on some of your home medications but at lower doses. Prescriptions for these lower doses have been sent to your pharmacy.   You should be taking: Metoprolol 25 mg twice a day, Losartan 25 mg once a day, and Amlodipine 2.5 mg once a day. Do not re-start your Lasix.   You should make an appointment with your primary care doctor to check on blood pressure control. You should also be seen by your cardiologist.

## 2015-07-11 ENCOUNTER — Encounter: Payer: Self-pay | Admitting: Internal Medicine

## 2015-07-11 ENCOUNTER — Ambulatory Visit (INDEPENDENT_AMBULATORY_CARE_PROVIDER_SITE_OTHER): Payer: Medicare Other | Admitting: Internal Medicine

## 2015-07-11 ENCOUNTER — Other Ambulatory Visit: Payer: Self-pay

## 2015-07-11 VITALS — BP 102/62 | HR 82 | Ht 73.0 in | Wt 232.0 lb

## 2015-07-11 DIAGNOSIS — I472 Ventricular tachycardia, unspecified: Secondary | ICD-10-CM

## 2015-07-11 DIAGNOSIS — I2510 Atherosclerotic heart disease of native coronary artery without angina pectoris: Secondary | ICD-10-CM

## 2015-07-11 MED ORDER — INSULIN DETEMIR 100 UNIT/ML FLEXPEN: 90.0000 [IU] | PEN_INJECTOR | Freq: Every day | SUBCUTANEOUS | Status: DC
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
## 2015-07-11 NOTE — Patient Instructions (Addendum)

## 2015-07-14 ENCOUNTER — Encounter: Payer: Self-pay | Admitting: *Deleted

## 2015-07-14 ENCOUNTER — Other Ambulatory Visit: Payer: Self-pay | Admitting: Family Medicine

## 2015-07-14 NOTE — Telephone Encounter (Signed)
May have this +3 additional refills 

## 2015-07-16 ENCOUNTER — Ambulatory Visit (INDEPENDENT_AMBULATORY_CARE_PROVIDER_SITE_OTHER): Payer: Medicare Other | Admitting: Family Medicine

## 2015-07-16 ENCOUNTER — Encounter: Payer: Self-pay | Admitting: Family Medicine

## 2015-07-16 VITALS — BP 130/80 | Ht 69.0 in | Wt 244.2 lb

## 2015-07-16 DIAGNOSIS — I10 Essential (primary) hypertension: Secondary | ICD-10-CM | POA: Diagnosis not present

## 2015-07-16 DIAGNOSIS — I2510 Atherosclerotic heart disease of native coronary artery without angina pectoris: Secondary | ICD-10-CM | POA: Diagnosis not present

## 2015-07-16 DIAGNOSIS — E876 Hypokalemia: Secondary | ICD-10-CM | POA: Diagnosis not present

## 2015-07-16 DIAGNOSIS — R55 Syncope and collapse: Secondary | ICD-10-CM

## 2015-07-16 MED ORDER — DULOXETINE HCL 60 MG PO CPEP: ORAL_CAPSULE | ORAL | Status: DC

## 2015-07-16 MED ORDER — FUROSEMIDE 20 MG PO TABS: 20.0000 mg | ORAL_TABLET | Freq: Every day | ORAL | Status: DC

## 2015-07-16 NOTE — Progress Notes (Signed)
   Subjective:    Patient ID: Lance [name], male    DOB: 1945/11/30, 70 y.o.   MRN: [id number]  HPI Patient is here today for a hospital follow up visit. Patient was treated at [facility] on 07/06/15 for syncope.  Patient states that he is feeling a little better. Patient states that he has some questions about his medications.  ER notes lab work and Hospital notes were reviewed in detail with family present. Patient feels that his passing out was related to low blood pressure. We went over his medications today. I doubt that he had a cardiac arrhythmia. Apparently his ICD was interrogated and did not show any type of discharge. Review of Systems    patient denies any current chest pain shortness breath nausea vomiting diarrhea some swelling in the legs Objective:   Physical Exam  Blood pressure was checked laying sitting standing approximate 130/80 with no significant drop Lungs are clear no crackles heart is regular extremities patient does have lower leg 1-2+ edema skin warm dry      Assessment & Plan:  Pedal edema reestablish Lasix on a regular basis. Consider repeating metabolic 7 in 3-4 weeks Keep up with blood pressure his blood pressure cuff reads approximately 6-8 points higher than R wall unit. Follow up with endocrinology for diabetes If any further syncope go to the ER Follow-up with cardiology. If any problems with medication call and bring them back for [place] to review I would like to see the patient back in 2 months

## 2015-07-18 DIAGNOSIS — B351 Tinea unguium: Secondary | ICD-10-CM | POA: Diagnosis not present

## 2015-07-18 DIAGNOSIS — E1142 Type 2 diabetes mellitus with diabetic polyneuropathy: Secondary | ICD-10-CM | POA: Diagnosis not present

## 2015-07-24 ENCOUNTER — Ambulatory Visit (INDEPENDENT_AMBULATORY_CARE_PROVIDER_SITE_OTHER): Payer: Medicare Other | Admitting: Adult Health

## 2015-07-24 ENCOUNTER — Encounter: Payer: Self-pay | Admitting: Adult Health

## 2015-07-24 VITALS — BP 112/60 | HR 98 | Ht 69.0 in | Wt 241.0 lb

## 2015-07-24 DIAGNOSIS — E0810 Diabetes mellitus due to underlying condition with ketoacidosis without coma: Secondary | ICD-10-CM | POA: Diagnosis not present

## 2015-07-24 DIAGNOSIS — I2510 Atherosclerotic heart disease of native coronary artery without angina pectoris: Secondary | ICD-10-CM

## 2015-07-24 DIAGNOSIS — I10 Essential (primary) hypertension: Secondary | ICD-10-CM

## 2015-07-24 DIAGNOSIS — Z95810 Presence of automatic (implantable) cardiac defibrillator: Secondary | ICD-10-CM

## 2015-07-24 NOTE — Patient Instructions (Signed)
Medication Instructions:  Your physician recommends that you continue on your current medications as directed. Please refer to the Current Medication list given to you today.   Labwork: NONE  Testing/Procedures: NONE  Follow-Up: Your physician wants you to follow-up in: FOLLOW UP WITH DR. Lovena Le 07/2016.   You will receive a reminder letter in the mail two months in advance. If you don't receive a letter, please call our office to schedule the follow-up appointment.    Any Other Special Instructions Will Be Listed Below (If Applicable).     If you need a refill on your cardiac medications before your next appointment, please call your pharmacy.
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
## 2015-07-24 NOTE — Progress Notes (Signed)
Name: Lance [name]    DOB: 02-25-1946  Age: 70 y.o.  MR#: [id number]       PCP:  Sallee Lange, MD      Insurance: Payor: MEDICARE / Plan: MEDICARE PART A AND B / Product Type: *No Product type* /   CC:    Chief Complaint  Patient presents with  . Dizziness  . Coronary Artery Disease    VS Filed Vitals:   07/24/15 1356  BP: 112/60  Pulse: 98  Height: 5\' 9"  (1.753 m)  Weight: 241 lb (109.317 kg)  SpO2: 97%    Weights Current Weight  07/24/15 241 lb (109.317 kg)  07/16/15 244 lb 4 oz (110.791 kg)  07/11/15 232 lb (105.235 kg)    Blood Pressure  BP Readings from Last 3 Encounters:  07/24/15 112/60  07/16/15 130/80  07/11/15 102/62     Admit date:  (Not on file) Last encounter with RMR:  Visit date not found   Allergy Penicillins; Latex; Levaquin; Metformin and related; Morphine and related; Percocet; and Tape  Current Outpatient Prescriptions  Medication Sig Dispense Refill  . albuterol (PROVENTIL HFA;VENTOLIN HFA) 108 (90 BASE) MCG/ACT inhaler Inhale 2 puffs into the lungs every 6 (six) hours as needed for wheezing. 1 Inhaler 2  . albuterol (PROVENTIL) (2.5 MG/3ML) 0.083% nebulizer solution Take 3 mLs (2.5 mg total) by nebulization every 6 (six) hours as needed for wheezing or shortness of breath. 150 mL 0  . ALPRAZolam (XANAX) 1 MG tablet TAKE 1/2-1 TABLET 2 TIMES A DAY AS NEEDED 60 tablet 3  . amLODipine (NORVASC) 2.5 MG tablet Take 1 tablet (2.5 mg total) by mouth daily. 30 tablet 0  . aspirin 81 MG tablet Take 81 mg by mouth daily.    Marland Kitchen atorvastatin (LIPITOR) 80 MG tablet Take 80 mg by mouth daily.     . clopidogrel (PLAVIX) 75 MG tablet TAKE ONE TABLET EVERY MORNING WITH BREAKFAST 30 tablet 5  . dexlansoprazole (DEXILANT) 60 MG capsule Take 60 mg by mouth 2 (two) times daily.    Marland Kitchen dicyclomine (BENTYL) 10 MG capsule TAKE ONE CAPSULE EVERY MORNING AND ONE CAPSULE AT BEDTIME AS NEEDED FOR INCREASED STOOLS AND BLOATING 60 capsule 1  . donepezil (ARICEPT) 10 MG tablet  Take 1 tablet (10 mg total) by mouth at bedtime. 30 tablet 8  . DULoxetine (CYMBALTA) 60 MG capsule TAKE ONE (1) CAPSULE EACH DAY 30 capsule 5  . fexofenadine (ALLEGRA) 180 MG tablet Take 180 mg by mouth daily as needed.     . furosemide (LASIX) 20 MG tablet Take 1 tablet (20 mg total) by mouth daily. 90 tablet 3  . gabapentin (NEURONTIN) 300 MG capsule Take one to two capsules po tid prn (Patient taking differently: Take 300 mg by mouth 2 (two) times daily. ) 180 capsule 2  . HUMALOG KWIKPEN 100 UNIT/ML KiwkPen Inject 0.32-0.38 mLs (32-38 Units total) into the skin 3 (three) times daily. 30 pen 2  . HYDROcodone-acetaminophen (NORCO) 7.5-325 MG per tablet Take 1 tablet by mouth every 6 (six) hours as needed for moderate pain.     . Insulin Detemir (LEVEMIR FLEXTOUCH) 100 UNIT/ML Pen Inject 90 Units into the skin daily at 10 pm. 30 pen 2  . Insulin Pen Needle 31G X 6 MM MISC Use as directed 4 x daily 150 each 11  . losartan (COZAAR) 25 MG tablet Take 1 tablet (25 mg total) by mouth daily. 30 tablet 0  . metoprolol tartrate (LOPRESSOR) 25 MG  tablet Take 1 tablet (25 mg total) by mouth 2 (two) times daily. 30 tablet 0  . nitroGLYCERIN (NITROSTAT) 0.4 MG SL tablet Place 1 tablet (0.4 mg total) under the tongue every 5 (five) minutes as needed. For chest pain 25 tablet 3  . ONE TOUCH ULTRA TEST test strip USE TO TEST BLOOD SUGAR FOUR TIMES DAILY 100 each 5  . Probiotic Product (ALIGN PO) Take 1 tablet by mouth daily.     . sildenafil (REVATIO) 20 MG tablet Take 3 -5 tabs as needed for erectile dysfunction 20 tablet 4  . sitaGLIPtin (JANUVIA) 25 MG tablet Take 1 tablet (25 mg total) by mouth daily. 30 tablet 2  . traZODone (DESYREL) 100 MG tablet TAKE ONE-HALF TO ONE TABLET AT BEDTIME AS NEEDED FOR SLEEP (Patient taking differently: TAKE ONE TABLET AT BEDTIME AS NEEDED FOR SLEEP) 30 tablet 5  . vitamin B-12 (CYANOCOBALAMIN) 1000 MCG tablet Take 1,000 mcg by mouth daily.      . Vitamin D, Ergocalciferol,  (DRISDOL) 50000 units CAPS capsule TAKE ONE CAPSULE BY MOUTH ONCE A WEEK (Patient taking differently: TAKE ONE CAPSULE BY MOUTH ONCE A MONTH -3RD OF MONTH) 12 capsule 0   No current facility-administered medications for this visit.    Discontinued Meds:   There are no discontinued medications.  Patient Active Problem List   Diagnosis Date Noted  . Shortness of breath   . Cardiac syncope 07/07/2015  . Arterial hypotension   . AKI (acute kidney injury) ([place])   . Syncope 07/06/2015  . Proliferative diabetic retinopathy of left eye with macular edema associated with type 2 diabetes mellitus 05/09/2015  . Abdominal bloating 04/18/2015  . RUQ fullness 04/18/2015  . LUQ pain 04/18/2015  . ICD (implantable cardioverter-defibrillator) malfunction 03/31/2015  . Diabetic peripheral neuropathy ([place]) 09/06/2014  . Unstable angina ([place]) 05/31/2014  . S/P DES to SVG-PDA 05/30/14 05/31/2014  . S/P CABG x 4 Sept 2013 05/30/2014  . DOE (dyspnea on exertion) 05/13/2014  . Memory deficits 09/05/2013  . Dizziness 08/07/2013  . ICD (implantable cardioverter-defibrillator) in place - St [name] 08/07/2013  . Ventricular fibrillation ([place]) 02/28/2013  . Cardiomyopathy, ischemic-EF 35-40% 02/09/2012  . Congestive heart failure ([place]) 02/09/2012  . Diarrhea 12/24/2011  . Dyspnea 12/06/2011  . H/O TIA (transient ischemic attack) 12/01/2011  . Encounter for servicing of automatic implantable cardioverter-defibrillator (AICD) at end of battery life 10/05/2011  . ASTHMA, MILD 04/02/2009  . Dyslipidemia 03/26/2009  . TOBACCO ABUSE 03/26/2009  . ATHEROSCLEROTIC CARDIOVASCULAR DISEASE 03/26/2009  . HYPOTENSION, ORTHOSTATIC 01/07/2009  . SYNCOPE 01/07/2009  . PITUITARY INSUFFICIENCY 12/07/2007  . ERECTILE DYSFUNCTION, ORGANIC 12/07/2007  . HYPOGONADISM, MALE 03/06/2007  . Type 2 diabetes mellitus with vascular disease ([place]) 01/12/2007  . DEPRESSION 01/12/2007  . Essential hypertension 01/12/2007  . ALLERGIC  RHINITIS 01/12/2007  . GERD 01/12/2007  . BENIGN PROSTATIC HYPERTROPHY 01/12/2007    LABS    Component Value Date/Time   NA 138 07/09/2015 0440   NA 141 07/08/2015 0824   NA 136 07/07/2015 0502   NA 138 08/23/2014 1141   K 3.8 07/09/2015 0440   K 3.8 07/08/2015 0824   K 4.6 07/07/2015 0502   CL 101 07/09/2015 0440   CL 105 07/08/2015 0824   CL 106 07/07/2015 0502   CO2 27 07/09/2015 0440   CO2 29 07/08/2015 0824   CO2 23 07/07/2015 0502   GLUCOSE 215* 07/09/2015 0440   GLUCOSE 127* 07/08/2015 0824   GLUCOSE 258* 07/07/2015 0502  GLUCOSE 219* 08/23/2014 1141   BUN 11 07/09/2015 0440   BUN 6 07/08/2015 0824   BUN 14 07/07/2015 0502   BUN 14 08/23/2014 1141   CREATININE 0.95 07/09/2015 0440   CREATININE 0.96 07/08/2015 0824   CREATININE 1.14 07/07/2015 0502   CREATININE 0.86 05/15/2015 0730   CREATININE 1.25 05/29/2014 1435   CREATININE 1.02 07/25/2013 0730   CALCIUM 9.4 07/09/2015 0440   CALCIUM 8.8* 07/08/2015 0824   CALCIUM 8.0* 07/07/2015 0502   GFRNONAA >60 07/09/2015 0440   GFRNONAA >60 07/08/2015 0824   GFRNONAA >60 07/07/2015 0502   GFRAA >60 07/09/2015 0440   GFRAA >60 07/08/2015 0824   GFRAA >60 07/07/2015 0502   CMP     Component Value Date/Time   NA 138 07/09/2015 0440   NA 138 08/23/2014 1141   K 3.8 07/09/2015 0440   CL 101 07/09/2015 0440   CO2 27 07/09/2015 0440   GLUCOSE 215* 07/09/2015 0440   GLUCOSE 219* 08/23/2014 1141   BUN 11 07/09/2015 0440   BUN 14 08/23/2014 1141   CREATININE 0.95 07/09/2015 0440   CREATININE 0.86 05/15/2015 0730   CALCIUM 9.4 07/09/2015 0440   PROT 5.8* 07/07/2015 0152   PROT 6.9 08/23/2014 1141   ALBUMIN 3.0* 07/07/2015 0152   ALBUMIN 4.3 08/23/2014 1141   AST 21 07/07/2015 0152   ALT 26 07/07/2015 0152   ALKPHOS 82 07/07/2015 0152   BILITOT 0.4 07/07/2015 0152   BILITOT 0.3 08/23/2014 1141   GFRNONAA >60 07/09/2015 0440   GFRAA >60 07/09/2015 0440       Component Value Date/Time   WBC 5.4 07/09/2015  0440   WBC 4.9 07/08/2015 0824   WBC 6.1 07/07/2015 0502   WBC 6.2 08/23/2014 1141   HGB 12.5* 07/09/2015 0440   HGB 12.0* 07/08/2015 0824   HGB 11.4* 07/07/2015 0502   HCT 37.4* 07/09/2015 0440   HCT 34.5* 07/08/2015 0824   HCT 33.0* 07/07/2015 0502   HCT 39.5 08/23/2014 1141   MCV 82.7 07/09/2015 0440   MCV 82.5 07/08/2015 0824   MCV 82.7 07/07/2015 0502   MCV 86 08/23/2014 1141    Lipid Panel     Component Value Date/Time   CHOL 154 07/25/2013 0730   TRIG 155* 07/25/2013 0730   HDL 31* 07/25/2013 0730   CHOLHDL 5.0 07/25/2013 0730   VLDL 31 07/25/2013 0730   LDLCALC 92 07/25/2013 0730    ABG    Component Value Date/Time   PHART 7.330* 01/10/2012 2231   PCO2ART 51.4* 01/10/2012 2231   PO2ART 126.0* 01/10/2012 2231   HCO3 26.9* 01/10/2012 2231   TCO2 26 05/31/2012 1512   ACIDBASEDEF 2.0 01/10/2012 2044   O2SAT 98.0 01/10/2012 2231     Lab Results  Component Value Date   TSH 1.286 07/07/2015   BNP (last 3 results)  Recent Labs  07/07/15 0201  BNP 42.5    ProBNP (last 3 results) No results for input(s): PROBNP in the last 8760 hours.  Cardiac Panel (last 3 results) No results for input(s): CKTOTAL, CKMB, TROPONINI, RELINDX in the last 72 hours.  Iron/TIBC/Ferritin/ %Sat    Component Value Date/Time   IRON 63 12/07/2007 1023   FERRITIN 76 08/23/2014 1141   IRONPCTSAT 22.5 12/07/2007 1023     EKG Orders placed or performed during the hospital encounter of 07/06/15  . ED EKG  . ED EKG  . EKG 12-Lead  . EKG 12-Lead  . EKG 12-Lead  . EKG 12-Lead  .  EKG 12-Lead  . EKG 12-Lead  . EKG     Prior Assessment and Plan Problem List as of 07/24/2015      Cardiovascular and Mediastinum   Essential hypertension   Last Assessment & Plan 07/05/2014 Office Visit Written 07/05/2014 10:52 AM by Evans Lance, MD    His systolic blood pressure is well controlled. Will follow.       H/O TIA (transient ischemic attack)   Cardiomyopathy, ischemic-EF 35-40%    Last Assessment & Plan 06/10/2014 Office Visit Written 06/10/2014  4:21 PM by Lendon Colonel, NP    He is not currently on optimal medical therapy.  He should be on an ACE inhibitor, beta blocker, and a low dose diuretic.  The patient refuses medication changes at this time.  I discussed with him that this is the better.  Treatment for him for his current diagnosis and systolic dysfunction.  He is willing to continue Plavix 75 mg as this is a portable to him.  He is also willing to continue on a statin medication.  He is on amlodipine, which is not a good choice with systolic dysfunction, and would like to take him off of that and place him on Cipro.  He wishes to avoid a bunch of medication changes as he is not certain his insurance will pay for it. [place] on discharge, was 1. 01.  We will see him again in 3 months and hopefully be able to convince him to change his medication regimen, which I believe will assist him with overall good prognosis.      Type 2 diabetes mellitus with vascular disease [facility])   Last Assessment & Plan 12/06/2011 Office Visit Written 12/06/2011  5:27 PM by Darlin Coco, MD    The patient denies any hypoglycemic episodes      ATHEROSCLEROTIC CARDIOVASCULAR DISEASE   Last Assessment & Plan 02/28/2013 Office Visit Written 02/28/2013 11:03 AM by Evans Lance, MD    The patient denies anginal symptoms. He remains very active, working in his garden and yard.      HYPOTENSION, ORTHOSTATIC   Last Assessment & Plan 07/07/2011 Office Visit Written 07/07/2011 10:45 AM by Renella Cunas, MD    Resolved.      SYNCOPE   Congestive heart failure [facility])   Last Assessment & Plan 07/05/2014 Office Visit Written 07/05/2014 10:51 AM by Evans Lance, MD    HIs chronic systolic heart failure is class 2. He is trying to eat less salt. He will continue his current meds.       Ventricular fibrillation [facility])   Last Assessment & Plan 02/28/2013 Office Visit Written 02/28/2013 11:04 AM by Evans Lance, MD    Interrogation of his ICD demonstrates no recurrent ventricular arrhythmias. He will continue his current medical therapy and undergo watchful waiting.      Unstable angina (HCC)   Syncope   Cardiac syncope   Arterial hypotension     Respiratory   ALLERGIC RHINITIS   ASTHMA, MILD     Digestive   GERD   Last Assessment & Plan 04/18/2015 Office Visit Written 04/18/2015 10:56 AM by Mahala Menghini, PA-C    Continues to have GERD symptoms, reports on Dexilant BID. Will have him confirm that he is not also on pantoprazole with his wife when he gets home. Discussed antireflux measures. Await pending ultrasound. Further recommendations to follow.        Endocrine   PITUITARY INSUFFICIENCY   HYPOGONADISM,  MALE   Diabetic peripheral neuropathy (HCC)   Proliferative diabetic retinopathy of left eye with macular edema associated with type 2 diabetes mellitus     Genitourinary   BENIGN PROSTATIC HYPERTROPHY   ERECTILE DYSFUNCTION, ORGANIC   AKI (acute kidney injury) ([place])     Other   Dyslipidemia   Last Assessment & Plan 07/13/2012 Office Visit Written 07/13/2012  3:47 PM by Renella Cunas, MD    His last LDL was above 100. He was advised to take a statin. He has not done so. His noncompliance is a major issue.      ICD (implantable cardioverter-defibrillator) in place - [place] 07/05/2014 Office Visit Written 07/05/2014 10:50 AM by Evans Lance, MD    His St. [name] ICD is working normally. Will recheck in several months.      Memory deficits   S/P CABG x 4 Sept 2013   TOBACCO ABUSE   Last Assessment & Plan 07/07/2011 Office Visit Written 07/07/2011 10:43 AM by Renella Cunas, MD    Encouraged to quit.      DEPRESSION   Encounter for servicing of automatic implantable cardioverter-defibrillator (AICD) at end of battery life   Last Assessment & Plan 02/28/2013 Office Visit Written 02/28/2013 11:03 AM by Evans Lance, MD    His St. [name] single chamber  ICD is working normally. We'll plan to recheck in several months.      Dyspnea   Last Assessment & Plan 07/13/2012 Office Visit Written 07/13/2012  3:46 PM by Renella Cunas, MD    I had a long cope with the patient and his wife. His dyspnea now is not cardiac but pulmonary. Before he was probably limited by severe three-vessel disease, now he is limited by significant COPD. He has a long-standing tobacco history and still smokes. I have strongly encouraged him to stop since he is now on a significant decline in his functional status. He says he will quit. No further cardiac workup at this time.      Diarrhea   Last Assessment & Plan 12/24/2011 Office Visit Edited 12/24/2011 11:07 AM by Andria Meuse, NP    Post-prandial abdominal pain & loose stools.  Suspect diabetic enteropathy/pancreatic insufficiency.  Continue levsin prn Keep up the good work with your blood sugars Stop using tobacco Try Creon 24- 2 w/ meals & 1 w/ snacks-30 mins before eating A week later, add ALIGN probiotics daily Call with a progress report in 10 days Office visit in 6 months  Please discuss follow up with LEFT KIDNEY CYST (slightly larger than before on last CT) w/ Dr Wolfgang Phoenix.       Dizziness   DOE (dyspnea on exertion)   Last Assessment & Plan 06/11/2014 Office Visit Written 06/11/2014 10:55 AM by Kathee Delton, MD    The patient has tried Spiriva from the last visit, and has seen no significant change in his breathing. This is not surprising, given that his PFTs were really unimpressive. He has significant underlying cardiac disease with cardiomyopathy, and was recently in the hospital for a percutaneous intervention. He is also obese and has deconditioning.  He has had a recent d-dimer that was unimpressive. At this point, I really don't see a lot from a pulmonary standpoint to improve his breathing. I have encouraged him to work aggressively on weight loss, as well as conditioning. I would be happy to see him back  in the future if new  issues develop.      S/P DES to SVG-PDA 05/30/14   Last Assessment & Plan 06/10/2014 Office Visit Written 06/10/2014  4:17 PM by Lendon Colonel, NP    He is tolerating his medications well.  His main complaint is been for his insulin and changing insurance benefits.  He is not on a beta blocker for unknown reason.  With his cardiomyopathy, EF of 40%, I would recommend low-dose beta blocker.  He does not wish to add any more medications to his regimen as he is having to take for a lot of his medications and they are becoming.  Expensive for him.  I have advised him that this is the better choice for him to be able to feel better. He  He wishes to wait on adding an additional medicine. He will be seen in 3 months. Optimal medical therapy needs to be in place to improve prognosis.      ICD (implantable cardioverter-defibrillator) malfunction   Abdominal bloating   Last Assessment & Plan 04/18/2015 Office Visit Written 04/18/2015 10:58 AM by Mahala Menghini, PA-C    Stool consistency has improved off metformin but he continues to have increased frequency, bloating and fullness in the abdomen. 5-7 pound weight gain in the past 6 months. Mild anemia and thrombocytopenia. Some refractory GERD now as outlined as well. Some fullness in the right upper quadrant. Really no abdominal pain but he's had some left upper quadrant discomfort off and on which is quite vague. Initially plan on abdominal ultrasound to rule out hepatomegaly or underlying liver disease, biliary etiology. Add align once daily, patient denies being on at this time. Further recommendations to follow pending abdominal ultrasound results.      RUQ fullness   LUQ pain   Shortness of breath       Imaging: X-ray Chest Pa And Lateral  07/07/2015  CLINICAL DATA:  Syncopal episode. EXAM: CHEST  2 VIEW COMPARISON:  04/01/2015 FINDINGS: Postoperative changes in the mediastinum and cervical spine. Cardiac pacemaker. Slightly  shallow inspiration. Normal heart size and pulmonary vascularity. No focal airspace disease or consolidation in the lungs. No blunting of costophrenic angles. No pneumothorax. Mediastinal contours appear intact. IMPRESSION: No active cardiopulmonary disease. Electronically Signed   By: Lucienne Capers M.D.   On: 07/07/2015 04:21   Ct Head Wo Contrast  07/07/2015  CLINICAL DATA:  Initial evaluation for syncopal episodes. EXAM: CT HEAD WITHOUT CONTRAST TECHNIQUE: Contiguous axial images were obtained from the base of the skull through the vertex without intravenous contrast. COMPARISON:  Prior study from 08/07/2013. FINDINGS: Mild atrophy with chronic small vessel ischemic disease present. Probable small remote lacunar infarct within the right thalamus. Scattered vascular calcifications within the carotid siphons and distal vertebral arteries. No acute large vessel territory infarct. No acute intracranial hemorrhage. No mass lesion, midline shift, or mass effect. No hydrocephalus. No extra-axial fluid collection. Scalp soft tissues within normal limits. No acute abnormality about the orbits. Mild mucosal thickening within the maxillary sinuses and ethmoidal air cells. Minimal mucosal thickening within the right frontal sinus is well. Right mastoid effusion, similar to prior, likely chronic. Middle ear cavities grossly clear. Calvarium intact. IMPRESSION: 1. No acute intracranial process. 2. Stable atrophy with mild chronic small vessel ischemic disease. 3. Chronic right mastoid effusion, stable. Electronically Signed   By: Jeannine Boga M.D.   On: 07/07/2015 04:21

## 2015-07-28 LAB — CUP PACEART INCLINIC DEVICE CHECK
Date Time Interrogation Session: 20170327141832
Implantable Lead Implant Date: 20050706
Implantable Lead Implant Date: 20161128
Implantable Lead Location: 753859
Implantable Lead Location: 753860
Implantable Lead Model: 7122
Pulse Gen Serial Number: 7041246

## 2015-07-29 ENCOUNTER — Ambulatory Visit (INDEPENDENT_AMBULATORY_CARE_PROVIDER_SITE_OTHER): Payer: Medicare Other | Admitting: Family Medicine

## 2015-07-29 ENCOUNTER — Encounter: Payer: Self-pay | Admitting: Family Medicine

## 2015-07-29 VITALS — BP 128/76 | Temp 98.5°F | Ht 69.0 in | Wt 240.0 lb

## 2015-07-29 DIAGNOSIS — I2510 Atherosclerotic heart disease of native coronary artery without angina pectoris: Secondary | ICD-10-CM | POA: Diagnosis not present

## 2015-07-29 DIAGNOSIS — B9689 Other specified bacterial agents as the cause of diseases classified elsewhere: Secondary | ICD-10-CM

## 2015-07-29 DIAGNOSIS — J301 Allergic rhinitis due to pollen: Secondary | ICD-10-CM

## 2015-07-29 DIAGNOSIS — J019 Acute sinusitis, unspecified: Secondary | ICD-10-CM

## 2015-07-29 MED ORDER — OLOPATADINE HCL 0.1 % OP SOLN: 1.0000 [drp] | Freq: Two times a day (BID) | OPHTHALMIC | Status: DC

## 2015-07-29 MED ORDER — DOXYCYCLINE HYCLATE 100 MG PO CAPS: 100.0000 mg | ORAL_CAPSULE | Freq: Two times a day (BID) | ORAL | Status: DC

## 2015-07-29 MED ORDER — FLUTICASONE PROPIONATE 50 MCG/ACT NA SUSP: 2.0000 | Freq: Every day | NASAL | Status: DC

## 2015-07-29 NOTE — Progress Notes (Signed)
   Subjective:    Patient ID: Lance [name], male    DOB: January 15, 1946, 70 y.o.   MRN: [id number]  Sinusitis This is a new problem. Episode onset: 5 days ago. There has been no fever. Associated symptoms include congestion, coughing and a sore throat. Past treatments include nothing.   Patient relates about a week of head congestion sneezing coughing watery eyes. Also relates chest congestion at times when drainage accumulates has tried OTC measures without much success   Review of Systems  HENT: Positive for congestion and sore throat.   Respiratory: Positive for cough.    Patient complains watery eyes nasal congestion coughing denies wheezing difficulty breathing in the chest denies high fever chills    Objective:   Physical Exam Mild sinus tenderness lungs are clear throat is normal in ears normal eyes watery       Assessment & Plan:  Allergic rhinitis-OTC fexofenadine along with Flonase 2 sprays each nostril daily in addition to this follow-up if progressive troubles  Secondary sinusitis antibiotics prescribed warning signs discussed follow-up of problems

## 2015-07-30 DIAGNOSIS — E113512 Type 2 diabetes mellitus with proliferative diabetic retinopathy with macular edema, left eye: Secondary | ICD-10-CM | POA: Diagnosis not present

## 2015-07-30 DIAGNOSIS — H43812 Vitreous degeneration, left eye: Secondary | ICD-10-CM | POA: Diagnosis not present

## 2015-07-30 DIAGNOSIS — E113591 Type 2 diabetes mellitus with proliferative diabetic retinopathy without macular edema, right eye: Secondary | ICD-10-CM | POA: Diagnosis not present

## 2015-07-30 DIAGNOSIS — H35371 Puckering of macula, right eye: Secondary | ICD-10-CM | POA: Diagnosis not present

## 2015-08-04 ENCOUNTER — Other Ambulatory Visit: Payer: Self-pay | Admitting: *Deleted

## 2015-08-04 MED ORDER — LOSARTAN POTASSIUM 25 MG PO TABS: 25.0000 mg | ORAL_TABLET | Freq: Every day | ORAL | Status: DC

## 2015-08-04 MED ORDER — METOPROLOL TARTRATE 25 MG PO TABS: 25.0000 mg | ORAL_TABLET | Freq: Two times a day (BID) | ORAL | Status: DC

## 2015-08-04 MED ORDER — AMLODIPINE BESYLATE 2.5 MG PO TABS: 2.5000 mg | ORAL_TABLET | Freq: Every day | ORAL | Status: DC

## 2015-08-06 ENCOUNTER — Other Ambulatory Visit: Payer: Self-pay | Admitting: Family Medicine

## 2015-08-20 ENCOUNTER — Other Ambulatory Visit: Payer: Self-pay | Admitting: "Endocrinology

## 2015-08-21 ENCOUNTER — Telehealth: Payer: Self-pay | Admitting: Family Medicine

## 2015-08-21 DIAGNOSIS — Z23 Encounter for immunization: Secondary | ICD-10-CM | POA: Diagnosis not present

## 2015-08-21 NOTE — Telephone Encounter (Signed)
#  1 please see [facility] form #2 this is requesting that I sign off on their request for diabetic shoes as well as my last office note regarding his diabetes-slight problem-patient follows with Dr. Dorris Fetch for his diabetes. #3 patient's options have Dr. Dorris Fetch fill in this and send his no or patient schedule office visit with [place] specifically for diabetic foot exam so we can properly documented in send it in with this request please find out what the patient would like to do an schedule accordingly or forward the information regarding his diabetic shoes to the diabetic specialist Dr. Dorris Fetch thank you

## 2015-08-22 NOTE — Telephone Encounter (Signed)
We could do this then. Please make sure that the papers are with his office visit on Monday so it can be filled out

## 2015-08-22 NOTE — Telephone Encounter (Signed)
Lance [name] has papers and will put with his folder for office visit on Monday.

## 2015-08-22 NOTE — Telephone Encounter (Signed)
Pt has appt on Monday 4/24 for fatigue. Can he have this done on same day or does he need separate visit

## 2015-08-25 ENCOUNTER — Encounter: Payer: Self-pay | Admitting: Family Medicine

## 2015-08-25 ENCOUNTER — Ambulatory Visit (INDEPENDENT_AMBULATORY_CARE_PROVIDER_SITE_OTHER): Payer: Medicare Other | Admitting: Family Medicine

## 2015-08-25 VITALS — BP 122/76 | Wt 239.0 lb

## 2015-08-25 DIAGNOSIS — I2510 Atherosclerotic heart disease of native coronary artery without angina pectoris: Secondary | ICD-10-CM | POA: Diagnosis not present

## 2015-08-25 DIAGNOSIS — R5383 Other fatigue: Secondary | ICD-10-CM

## 2015-08-25 DIAGNOSIS — D509 Iron deficiency anemia, unspecified: Secondary | ICD-10-CM

## 2015-08-25 MED ORDER — BUDESONIDE-FORMOTEROL FUMARATE 160-4.5 MCG/ACT IN AERO: 2.0000 | INHALATION_SPRAY | Freq: Two times a day (BID) | RESPIRATORY_TRACT | Status: DC

## 2015-08-25 NOTE — Progress Notes (Signed)
   Subjective:    Patient ID: Lance [name], male    DOB: [birth year], 70 y.o.   MRN: [id number]  HPIFatigue for about 6 months. Pt thinks it might be his antidepressant or testosterone level.   Pt needs diabetic foot exam. Last a1c 3.3.17. Sees Dr. Dorris Fetch  Patient denies being depressed. He denies feeling hopeless. He states he does have a lot of coughing congestion drainage brings up a lot of white phlegm.  He does have a history of smoking but does not smoke currently denies any hemoptysis. That is followed by specialist for his diabetes but has not had diabetic foot exam recently needs this in order to get issues patient denies being depressed patient denies he does not feel he is having problems sleep apnea.  Review of Systems Relates a lot of fatigue relates chest congestion relates coughing denies high fever chills sweats denies hemoptysis no vomiting or diarrhea no chest pressure tightness denies being depressed    Objective:   Physical Exam  Neck no masses lungs are clear no crackles cough is noted heart is regular abdomen soft no guarding rebound blood pressure taken laying and sitting approximately 104/68 laying 96/68 standing Diabetic foot exam both feet were inspected have severe neuropathy also pre-ulcerative calluses some bunion presentation pulses are good. 25 minutes was spent with the patient. Greater than half the time was spent in discussion and answering questions and counseling regarding the issues that the patient came in for today.     Assessment & Plan:  Significant fatigue issues this patient feels that it could be testosterone I talked with him about with his heart disease it would be a bad idea for this patient to be on testosterone therefore he does not one he get it checked Recent lab work showed significant anemia I believe the patient has aren't deficient anemia I believe we should go ahead and check lab work rationale discuss patient Patient relates that he is  had sleep study before does not one another 1 states it was negative Diabetic foot exam was completed today papers will be filled out Relatively low blood pressure this could be contributing to his significant issues therefore I recommend stopping amlodipine Follow-up within 3-4 months Patient will try Symbicort twice a day over the next month of helps him he will keep it up

## 2015-08-26 ENCOUNTER — Encounter: Payer: Self-pay | Admitting: Family Medicine

## 2015-08-26 LAB — IRON AND TIBC
Iron Saturation: 24 % (ref 15–55)
Iron: 62 ug/dL (ref 38–169)
Total Iron Binding Capacity: 259 ug/dL (ref 250–450)
UIBC: 197 ug/dL (ref 111–343)

## 2015-08-26 LAB — CBC WITH DIFFERENTIAL/PLATELET
Basophils Absolute: 0 10*3/uL (ref 0.0–0.2)
Basos: 1 %
EOS (ABSOLUTE): 0.1 10*3/uL (ref 0.0–0.4)
Eos: 2 %
Hematocrit: 38.8 % (ref 37.5–51.0)
Hemoglobin: 13.3 g/dL (ref 12.6–17.7)
Immature Grans (Abs): 0 10*3/uL (ref 0.0–0.1)
Immature Granulocytes: 0 %
Lymphocytes Absolute: 1.6 10*3/uL (ref 0.7–3.1)
Lymphs: 31 %
MCH: 28.5 pg (ref 26.6–33.0)
MCHC: 34.3 g/dL (ref 31.5–35.7)
MCV: 83 fL (ref 79–97)
Monocytes Absolute: 0.6 10*3/uL (ref 0.1–0.9)
Monocytes: 11 %
Neutrophils Absolute: 2.9 10*3/uL (ref 1.4–7.0)
Neutrophils: 55 %
Platelets: 153 10*3/uL (ref 150–379)
RBC: 4.67 x10E6/uL (ref 4.14–5.80)
RDW: 15.7 % — ABNORMAL HIGH (ref 12.3–15.4)
WBC: 5.2 10*3/uL (ref 3.4–10.8)

## 2015-08-26 LAB — FERRITIN: Ferritin: 112 ng/mL (ref 30–400)

## 2015-09-01 ENCOUNTER — Other Ambulatory Visit: Payer: Self-pay | Admitting: Adult Health

## 2015-09-01 ENCOUNTER — Other Ambulatory Visit: Payer: Self-pay | Admitting: Gastroenterology

## 2015-09-15 ENCOUNTER — Ambulatory Visit: Payer: Medicare Other | Admitting: Family Medicine

## 2015-09-16 ENCOUNTER — Ambulatory Visit: Payer: Medicare Other | Admitting: Family Medicine

## 2015-09-26 DIAGNOSIS — B351 Tinea unguium: Secondary | ICD-10-CM | POA: Diagnosis not present

## 2015-09-26 DIAGNOSIS — E1142 Type 2 diabetes mellitus with diabetic polyneuropathy: Secondary | ICD-10-CM | POA: Diagnosis not present

## 2015-10-03 ENCOUNTER — Other Ambulatory Visit: Payer: Self-pay | Admitting: Family Medicine

## 2015-10-06 DIAGNOSIS — E1142 Type 2 diabetes mellitus with diabetic polyneuropathy: Secondary | ICD-10-CM | POA: Diagnosis not present

## 2015-10-06 DIAGNOSIS — Z794 Long term (current) use of insulin: Secondary | ICD-10-CM | POA: Diagnosis not present

## 2015-10-06 DIAGNOSIS — E1165 Type 2 diabetes mellitus with hyperglycemia: Secondary | ICD-10-CM | POA: Diagnosis not present

## 2015-10-09 ENCOUNTER — Other Ambulatory Visit: Payer: Self-pay

## 2015-10-09 ENCOUNTER — Ambulatory Visit: Payer: Medicare Other | Admitting: "Endocrinology

## 2015-10-09 MED ORDER — NITROGLYCERIN 0.4 MG SL SUBL: 0.4000 mg | SUBLINGUAL_TABLET | SUBLINGUAL | Status: DC | PRN

## 2015-10-09 NOTE — Telephone Encounter (Signed)
Refill complete 

## 2015-10-13 ENCOUNTER — Telehealth: Payer: Self-pay | Admitting: *Deleted

## 2015-10-13 ENCOUNTER — Telehealth: Payer: Self-pay | Admitting: Cardiology

## 2015-10-13 ENCOUNTER — Ambulatory Visit (INDEPENDENT_AMBULATORY_CARE_PROVIDER_SITE_OTHER): Payer: Medicare Other | Admitting: *Deleted

## 2015-10-13 DIAGNOSIS — Z95810 Presence of automatic (implantable) cardiac defibrillator: Secondary | ICD-10-CM | POA: Diagnosis not present

## 2015-10-13 DIAGNOSIS — I255 Ischemic cardiomyopathy: Secondary | ICD-10-CM

## 2015-10-13 DIAGNOSIS — J329 Chronic sinusitis, unspecified: Secondary | ICD-10-CM

## 2015-10-13 NOTE — Telephone Encounter (Signed)
Confirmed remote transmission w/ pt wife.   

## 2015-10-13 NOTE — Telephone Encounter (Signed)
Pt has been seen multiple times for sinus drainage. He is requesting referral to specialist for sinus drainage. No fever, drainage is clear. Spitting up clear mucus. Having sob and wheezing. Using inhaler. Offered pt appt for the sob and wheezing. He declined. Pt advised if sob/wheezing got worse and if inhaler not helping. He needs to be seen. At this time he states he just wants referral for sinus.

## 2015-10-13 NOTE — Telephone Encounter (Signed)
Ok per dr Richardson Landry. Order for referral put in.

## 2015-10-15 ENCOUNTER — Encounter: Payer: Self-pay | Admitting: Family Medicine

## 2015-10-16 NOTE — Progress Notes (Signed)
Remote ICD transmission.   

## 2015-10-20 LAB — CUP PACEART REMOTE DEVICE CHECK
Date Time Interrogation Session: 20170619152511
HighPow Impedance: 56 Ohm
Implantable Lead Implant Date: 20050706
Implantable Lead Implant Date: 20161128
Implantable Lead Location: 753859
Implantable Lead Location: 753860
Implantable Lead Model: 7122
Lead Channel Impedance Value: 360 Ohm
Lead Channel Impedance Value: 550 Ohm
Lead Channel Sensing Intrinsic Amplitude: 11.3 mV
Lead Channel Sensing Intrinsic Amplitude: 2.9 mV
Pulse Gen Serial Number: 7041246

## 2015-10-23 ENCOUNTER — Encounter: Payer: Self-pay | Admitting: Cardiology

## 2015-10-28 DIAGNOSIS — E1142 Type 2 diabetes mellitus with diabetic polyneuropathy: Secondary | ICD-10-CM | POA: Diagnosis not present

## 2015-11-11 DIAGNOSIS — E1165 Type 2 diabetes mellitus with hyperglycemia: Secondary | ICD-10-CM | POA: Diagnosis not present

## 2015-11-11 DIAGNOSIS — Z720 Tobacco use: Secondary | ICD-10-CM | POA: Diagnosis not present

## 2015-11-11 DIAGNOSIS — E1142 Type 2 diabetes mellitus with diabetic polyneuropathy: Secondary | ICD-10-CM | POA: Diagnosis not present

## 2015-11-11 DIAGNOSIS — Z794 Long term (current) use of insulin: Secondary | ICD-10-CM | POA: Diagnosis not present

## 2015-11-11 DIAGNOSIS — Z6835 Body mass index (BMI) 35.0-35.9, adult: Secondary | ICD-10-CM | POA: Diagnosis not present

## 2015-11-11 DIAGNOSIS — E669 Obesity, unspecified: Secondary | ICD-10-CM | POA: Diagnosis not present

## 2015-11-13 ENCOUNTER — Ambulatory Visit (INDEPENDENT_AMBULATORY_CARE_PROVIDER_SITE_OTHER): Payer: Medicare Other | Admitting: Otolaryngology

## 2015-11-13 DIAGNOSIS — J310 Chronic rhinitis: Secondary | ICD-10-CM

## 2015-11-13 DIAGNOSIS — R0982 Postnasal drip: Secondary | ICD-10-CM | POA: Diagnosis not present

## 2015-11-13 DIAGNOSIS — J330 Polyp of nasal cavity: Secondary | ICD-10-CM

## 2015-11-13 DIAGNOSIS — R070 Pain in throat: Secondary | ICD-10-CM

## 2015-11-14 ENCOUNTER — Ambulatory Visit (INDEPENDENT_AMBULATORY_CARE_PROVIDER_SITE_OTHER): Payer: Medicare Other | Admitting: Cardiology

## 2015-11-14 ENCOUNTER — Encounter: Payer: Self-pay | Admitting: Cardiology

## 2015-11-14 VITALS — BP 112/78 | HR 76 | Ht 69.0 in | Wt 235.0 lb

## 2015-11-14 DIAGNOSIS — I255 Ischemic cardiomyopathy: Secondary | ICD-10-CM | POA: Diagnosis not present

## 2015-11-14 DIAGNOSIS — R7309 Other abnormal glucose: Secondary | ICD-10-CM

## 2015-11-14 DIAGNOSIS — R351 Nocturia: Secondary | ICD-10-CM | POA: Diagnosis not present

## 2015-11-14 DIAGNOSIS — I10 Essential (primary) hypertension: Secondary | ICD-10-CM

## 2015-11-14 DIAGNOSIS — I2510 Atherosclerotic heart disease of native coronary artery without angina pectoris: Secondary | ICD-10-CM

## 2015-11-14 DIAGNOSIS — E785 Hyperlipidemia, unspecified: Secondary | ICD-10-CM

## 2015-11-14 DIAGNOSIS — R739 Hyperglycemia, unspecified: Secondary | ICD-10-CM

## 2015-11-14 DIAGNOSIS — J449 Chronic obstructive pulmonary disease, unspecified: Secondary | ICD-10-CM
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
## 2015-11-14 NOTE — Patient Instructions (Signed)
Your physician wants you to follow-up in: 4 months You will receive a reminder letter in the mail two months in advance. If you don't receive a letter, please call our office to schedule the follow-up appointment.    Your physician recommends that you continue on your current medications as directed. Please refer to the Current Medication list given to you today.    You have been referred to  Pulmonary Rehab at [facility] physician recommends that you return for lab work in:  Fasting blood work     Thank you for choosing [facility] !

## 2015-11-19 ENCOUNTER — Other Ambulatory Visit: Payer: Self-pay | Admitting: Family Medicine

## 2015-11-20 ENCOUNTER — Telehealth: Payer: Self-pay | Admitting: Cardiology

## 2015-11-20 NOTE — Telephone Encounter (Signed)
Pt can't find his paper work to have his labs done, will need some more

## 2015-11-20 NOTE — Telephone Encounter (Signed)
Lab has copy,pt will go tomorrow

## 2015-11-21 DIAGNOSIS — R7309 Other abnormal glucose: Secondary | ICD-10-CM | POA: Diagnosis not present

## 2015-11-21 DIAGNOSIS — R351 Nocturia: Secondary | ICD-10-CM | POA: Diagnosis not present

## 2015-11-21 DIAGNOSIS — I10 Essential (primary) hypertension: Secondary | ICD-10-CM | POA: Diagnosis not present

## 2015-11-22 LAB — CBC
HCT: 40.1 % (ref 38.5–50.0)
Hemoglobin: 13.1 g/dL — ABNORMAL LOW (ref 13.2–17.1)
MCH: 27.9 pg (ref 27.0–33.0)
MCHC: 32.7 g/dL (ref 32.0–36.0)
MCV: 85.5 fL (ref 80.0–100.0)
MPV: 11.8 fL (ref 7.5–12.5)
Platelets: 151 10*3/uL (ref 140–400)
RBC: 4.69 MIL/uL (ref 4.20–5.80)
RDW: 14.9 % (ref 11.0–15.0)
WBC: 5.3 10*3/uL (ref 3.8–10.8)

## 2015-11-22 LAB — LIPID PANEL
Cholesterol: 125 mg/dL (ref 125–200)
HDL: 34 mg/dL — ABNORMAL LOW (ref >=40)
LDL Cholesterol: 55 mg/dL (ref <130)
Total CHOL/HDL Ratio: 3.7 Ratio (ref <=5.0)
Triglycerides: 178 mg/dL — ABNORMAL HIGH (ref <150)
VLDL: 36 mg/dL — ABNORMAL HIGH (ref <30)

## 2015-11-22 LAB — HEMOGLOBIN A1C
Hgb A1c MFr Bld: 9 % — ABNORMAL HIGH (ref <5.7)
Mean Plasma Glucose: 212 mg/dL

## 2015-11-22 LAB — COMPREHENSIVE METABOLIC PANEL
ALT: 26 U/L (ref 9–46)
AST: 26 U/L (ref 10–35)
Albumin: 3.7 g/dL (ref 3.6–5.1)
Alkaline Phosphatase: 94 U/L (ref 40–115)
BUN: 9 mg/dL (ref 7–25)
CO2: 30 mmol/L (ref 20–31)
Calcium: 8.6 mg/dL (ref 8.6–10.3)
Chloride: 99 mmol/L (ref 98–110)
Creat: 1 mg/dL (ref 0.70–1.25)
Glucose, Bld: 288 mg/dL — ABNORMAL HIGH (ref 65–99)
Potassium: 4.2 mmol/L (ref 3.5–5.3)
Sodium: 138 mmol/L (ref 135–146)
Total Bilirubin: 0.3 mg/dL (ref 0.2–1.2)
Total Protein: 6.2 g/dL (ref 6.1–8.1)

## 2015-11-22 LAB — PSA: PSA: 1.34 ng/mL (ref <=4.00)

## 2015-11-22 LAB — TSH: TSH: 3.4 mIU/L (ref 0.40–4.50)

## 2015-11-22 LAB — MAGNESIUM: Magnesium: 1.4 mg/dL — ABNORMAL LOW (ref 1.5–2.5)

## 2015-11-24 ENCOUNTER — Telehealth: Payer: Self-pay

## 2015-11-24 NOTE — Telephone Encounter (Signed)
Pt made aware of lab results. Sent in rx for magnesium oxide 400 mg daily. Copy to pcp

## 2015-11-25 ENCOUNTER — Telehealth: Payer: Self-pay

## 2015-11-25 ENCOUNTER — Encounter: Payer: Self-pay | Admitting: Family Medicine

## 2015-11-25 ENCOUNTER — Other Ambulatory Visit: Payer: Self-pay

## 2015-11-25 ENCOUNTER — Ambulatory Visit (INDEPENDENT_AMBULATORY_CARE_PROVIDER_SITE_OTHER): Payer: Medicare Other | Admitting: Family Medicine

## 2015-11-25 VITALS — BP 114/70 | Ht 69.0 in | Wt 230.0 lb

## 2015-11-25 DIAGNOSIS — R5383 Other fatigue: Secondary | ICD-10-CM

## 2015-11-25 DIAGNOSIS — I255 Ischemic cardiomyopathy: Secondary | ICD-10-CM | POA: Diagnosis not present

## 2015-11-25 DIAGNOSIS — I10 Essential (primary) hypertension: Secondary | ICD-10-CM | POA: Diagnosis not present

## 2015-11-25 MED ORDER — MAGNESIUM OXIDE 400 MG PO TABS: 400.0000 mg | ORAL_TABLET | Freq: Every day | ORAL | 3 refills | Status: DC

## 2015-11-25 NOTE — Telephone Encounter (Signed)
-----   Message from Arnoldo Lenis, MD sent at 11/24/2015  1:47 PM EDT ----- Labs show diabetes is not well controlled, please forward to pcp. Magnesium is low, start magnesium oxide 400mg  daily.   Zandra Abts MD

## 2015-11-25 NOTE — Telephone Encounter (Signed)
Sent in Rx for  MAGNESIUM.

## 2015-11-25 NOTE — Progress Notes (Addendum)
   Subjective:    Patient ID: Lance [name], male    DOB: Apr 01, 1946, 70 y.o.   MRN: [id number]  Hyperlipidemia  This is a chronic problem. The current episode started more than 1 year ago. Pertinent negatives include no chest pain.   Pt states he does not need any refills.   Sees Dr. Dorris Fetch for diabetes. A1C done on bloodwork by Dr. Harl Bowie.   Having increased sob. Has seen cardiology. Waiting to do cardiac rehab. Patient had pulmonary function test 2 years ago inconclusive saw pulmonologist did not feel he had COPD cardiac rehabilitation hopefully will     Review of Systems  Constitutional: Negative for activity change, appetite change and fatigue.  HENT: Negative for congestion.   Respiratory: Negative for cough.   Cardiovascular: Negative for chest pain.  Gastrointestinal: Negative for abdominal pain.  Endocrine: Negative for polydipsia and polyphagia.  Neurological: Negative for weakness.  Psychiatric/Behavioral: Negative for confusion.       Objective:   Physical Exam  Constitutional: He appears well-nourished. No distress.  Cardiovascular: Normal rate, regular rhythm and normal heart sounds.   No murmur heard. Pulmonary/Chest: Effort normal and breath sounds normal. No respiratory distress.  Musculoskeletal: He exhibits no edema.  Lymphadenopathy:    He has no cervical adenopathy.  Neurological: He is alert.  Psychiatric: His behavior is normal.  Vitals reviewed.         Assessment & Plan:  HTN- Patient was seen today as part of a visit regarding hypertension. The importance of healthy diet and regular physical activity was discussed. The importance of compliance with medications discussed. Ideal goal is to keep blood pressure low elevated levels certainly below Q000111Q when possible. The patient was counseled that keeping blood pressure under control lessen his risk of heart attack, stroke, kidney failure, and early death. The importance of regular follow-ups was  discussed with the patient. Low-salt diet such as DASH recommended. Regular physical activity was recommended as well. Patient was advised to keep regular follow-ups. Patient under the care of cardiology blood pressure very good control. Follow-up cardiology follow-up here 6 months  The patient was seen today as part of an evaluation regarding hyperlipidemia. Recent lab work has been reviewed with the patient as well as the goals for good cholesterol care. In addition to this medications have been discussed the importance of compliance with diet and medications discussed as well. Patient has been informed of potential side effects of medications in the importance to notify us should any problems occur. Finally the patient is aware that poor control of cholesterol, noncompliance can dramatically increase her risk of heart attack strokes and premature death. The patient will keep regular office visits and the patient does agreed to periodic lab work. LDL looks great. Patient compliant with medicine. Follow-up with cardiology. Recheck in 6 months patient follows up with endocrinology for his diabetes   It should be noted that the patient was seen totally by myself note was dictated as well as assessment and plan-Dr. Nicki Reaper

## 2015-11-25 NOTE — Patient Instructions (Signed)
DASH Eating Plan  DASH stands for "Dietary Approaches to Stop Hypertension." The DASH eating plan is a healthy eating plan that has been shown to reduce high blood pressure (hypertension). Additional health benefits may include reducing the risk of type 2 diabetes mellitus, heart disease, and stroke. The DASH eating plan may also help with weight loss.  WHAT DO I NEED TO KNOW ABOUT THE DASH EATING PLAN?  For the DASH eating plan, you will follow these general guidelines:  · Choose foods with a percent daily value for sodium of less than 5% (as listed on the food label).  · Use salt-free seasonings or herbs instead of table salt or sea salt.  · Check with your health care provider or pharmacist before using salt substitutes.  · Eat lower-sodium products, often labeled as "lower sodium" or "no salt added."  · Eat fresh foods.  · Eat more vegetables, fruits, and low-fat dairy products.  · Choose whole grains. Look for the word "whole" as the first word in the ingredient list.  · Choose fish and skinless chicken or turkey more often than red meat. Limit fish, poultry, and meat to 6 oz (170 g) each day.  · Limit sweets, desserts, sugars, and sugary drinks.  · Choose heart-healthy fats.  · Limit cheese to 1 oz (28 g) per day.  · Eat more home-cooked food and less restaurant, buffet, and fast food.  · Limit fried foods.  · Cook foods using methods other than frying.  · Limit canned vegetables. If you do use them, rinse them well to decrease the sodium.  · When eating at a restaurant, ask that your food be prepared with less salt, or no salt if possible.  WHAT FOODS CAN I EAT?  Seek help from a dietitian for individual calorie needs.  Grains  Whole grain or whole wheat bread. Brown rice. Whole grain or whole wheat pasta. Quinoa, bulgur, and whole grain cereals. Low-sodium cereals. Corn or whole wheat flour tortillas. Whole grain cornbread. Whole grain crackers. Low-sodium crackers.  Vegetables  Fresh or frozen vegetables  (raw, steamed, roasted, or grilled). Low-sodium or reduced-sodium tomato and vegetable juices. Low-sodium or reduced-sodium tomato sauce and paste. Low-sodium or reduced-sodium canned vegetables.   Fruits  All fresh, canned (in natural juice), or frozen fruits.  Meat and Other Protein Products  Ground beef (85% or leaner), grass-fed beef, or beef trimmed of fat. Skinless chicken or turkey. Ground chicken or turkey. Pork trimmed of fat. All fish and seafood. Eggs. Dried beans, peas, or lentils. Unsalted nuts and seeds. Unsalted canned beans.  Dairy  Low-fat dairy products, such as skim or 1% milk, 2% or reduced-fat cheeses, low-fat ricotta or cottage cheese, or plain low-fat yogurt. Low-sodium or reduced-sodium cheeses.  Fats and Oils  Tub margarines without trans fats. Light or reduced-fat mayonnaise and salad dressings (reduced sodium). Avocado. Safflower, olive, or canola oils. Natural peanut or almond butter.  Other  Unsalted popcorn and pretzels.  The items listed above may not be a complete list of recommended foods or beverages. Contact your dietitian for more options.  WHAT FOODS ARE NOT RECOMMENDED?  Grains  White bread. White pasta. White rice. Refined cornbread. Bagels and croissants. Crackers that contain trans fat.  Vegetables  Creamed or fried vegetables. Vegetables in a cheese sauce. Regular canned vegetables. Regular canned tomato sauce and paste. Regular tomato and vegetable juices.  Fruits  Dried fruits. Canned fruit in light or heavy syrup. Fruit juice.  Meat and Other Protein   Products  Fatty cuts of meat. Ribs, chicken wings, bacon, sausage, bologna, salami, chitterlings, fatback, hot dogs, bratwurst, and packaged luncheon meats. Salted nuts and seeds. Canned beans with salt.  Dairy  Whole or 2% milk, cream, half-and-half, and cream cheese. Whole-fat or sweetened yogurt. Full-fat cheeses or blue cheese. Nondairy creamers and whipped toppings. Processed cheese, cheese spreads, or cheese  curds.  Condiments  Onion and garlic salt, seasoned salt, table salt, and sea salt. Canned and packaged gravies. Worcestershire sauce. Tartar sauce. Barbecue sauce. Teriyaki sauce. Soy sauce, including reduced sodium. Steak sauce. Fish sauce. Oyster sauce. Cocktail sauce. Horseradish. Ketchup and mustard. Meat flavorings and tenderizers. Bouillon cubes. Hot sauce. Tabasco sauce. Marinades. Taco seasonings. Relishes.  Fats and Oils  Butter, stick margarine, lard, shortening, ghee, and bacon fat. Coconut, palm kernel, or palm oils. Regular salad dressings.  Other  Pickles and olives. Salted popcorn and pretzels.  The items listed above may not be a complete list of foods and beverages to avoid. Contact your dietitian for more information.  WHERE CAN I FIND MORE INFORMATION?  National Heart, Lung, and Blood Institute: www.nhlbi.nih.gov/health/health-topics/topics/dash/     This information is not intended to replace advice given to you by your health care provider. Make sure you discuss any questions you have with your health care provider.     Document Released: 04/08/2011 Document Revised: 05/10/2014 Document Reviewed: 02/21/2013  Elsevier Interactive Patient Education ©2016 [employer]

## 2015-12-05 DIAGNOSIS — E1142 Type 2 diabetes mellitus with diabetic polyneuropathy: Secondary | ICD-10-CM | POA: Diagnosis not present

## 2015-12-05 DIAGNOSIS — B351 Tinea unguium: Secondary | ICD-10-CM | POA: Diagnosis not present

## 2015-12-15 DIAGNOSIS — L820 Inflamed seborrheic keratosis: Secondary | ICD-10-CM | POA: Diagnosis not present

## 2015-12-16 ENCOUNTER — Other Ambulatory Visit: Payer: Self-pay | Admitting: Family Medicine

## 2015-12-17 NOTE — Telephone Encounter (Signed)
May have this +6 refills 

## 2015-12-18 ENCOUNTER — Ambulatory Visit (INDEPENDENT_AMBULATORY_CARE_PROVIDER_SITE_OTHER): Payer: Medicare Other | Admitting: Otolaryngology

## 2015-12-18 DIAGNOSIS — J310 Chronic rhinitis: Secondary | ICD-10-CM | POA: Diagnosis not present

## 2015-12-22 ENCOUNTER — Other Ambulatory Visit: Payer: Self-pay | Admitting: Family Medicine

## 2015-12-31 DIAGNOSIS — Z79899 Other long term (current) drug therapy: Secondary | ICD-10-CM | POA: Diagnosis not present

## 2015-12-31 DIAGNOSIS — M542 Cervicalgia: Secondary | ICD-10-CM | POA: Diagnosis not present

## 2015-12-31 DIAGNOSIS — M961 Postlaminectomy syndrome, not elsewhere classified: Secondary | ICD-10-CM | POA: Diagnosis not present

## 2015-12-31 DIAGNOSIS — G894 Chronic pain syndrome: Secondary | ICD-10-CM | POA: Diagnosis not present

## 2015-12-31 DIAGNOSIS — Z79891 Long term (current) use of opiate analgesic: Secondary | ICD-10-CM | POA: Diagnosis not present

## 2015-12-31 DIAGNOSIS — M4722 Other spondylosis with radiculopathy, cervical region: Secondary | ICD-10-CM | POA: Diagnosis not present

## 2016-01-07 ENCOUNTER — Ambulatory Visit (INDEPENDENT_AMBULATORY_CARE_PROVIDER_SITE_OTHER): Payer: Medicare Other | Admitting: Nurse Practitioner

## 2016-01-07 ENCOUNTER — Encounter: Payer: Self-pay | Admitting: Nurse Practitioner

## 2016-01-07 VITALS — BP 149/84 | HR 74 | Ht 69.0 in | Wt 241.6 lb

## 2016-01-07 DIAGNOSIS — R413 Other amnesia: Secondary | ICD-10-CM | POA: Diagnosis not present

## 2016-01-07 DIAGNOSIS — I255 Ischemic cardiomyopathy: Secondary | ICD-10-CM

## 2016-01-07 DIAGNOSIS — I10 Essential (primary) hypertension: Secondary | ICD-10-CM

## 2016-01-07 MED ORDER — DONEPEZIL HCL 10 MG PO TABS: 10.0000 mg | ORAL_TABLET | Freq: Every day | ORAL | 3 refills | Status: DC

## 2016-01-07 NOTE — Progress Notes (Signed)
I have read the note, and I agree with the clinical assessment and plan.  Lance [name]   
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
## 2016-01-07 NOTE — Patient Instructions (Signed)
Continue Aricept at current dose will refill Encourage good nutrition and hydration Follow-up in 8 months

## 2016-01-08 ENCOUNTER — Other Ambulatory Visit: Payer: Self-pay

## 2016-01-08 ENCOUNTER — Other Ambulatory Visit: Payer: Self-pay | Admitting: "Endocrinology

## 2016-01-08 MED ORDER — GLUCOSE BLOOD VI STRP: ORAL_STRIP | 0 refills | Status: DC

## 2016-01-14 DIAGNOSIS — M47812 Spondylosis without myelopathy or radiculopathy, cervical region: Secondary | ICD-10-CM | POA: Diagnosis not present

## 2016-01-15 ENCOUNTER — Ambulatory Visit (INDEPENDENT_AMBULATORY_CARE_PROVIDER_SITE_OTHER): Payer: Medicare Other | Admitting: *Deleted

## 2016-01-15 DIAGNOSIS — I255 Ischemic cardiomyopathy: Secondary | ICD-10-CM | POA: Diagnosis not present

## 2016-01-15 NOTE — Progress Notes (Signed)
Remote ICD transmission.   

## 2016-01-16 ENCOUNTER — Encounter: Payer: Self-pay | Admitting: Cardiology

## 2016-01-16 DIAGNOSIS — E1165 Type 2 diabetes mellitus with hyperglycemia: Secondary | ICD-10-CM | POA: Diagnosis not present

## 2016-01-16 DIAGNOSIS — E1142 Type 2 diabetes mellitus with diabetic polyneuropathy: Secondary | ICD-10-CM | POA: Diagnosis not present

## 2016-01-16 DIAGNOSIS — Z794 Long term (current) use of insulin: Secondary | ICD-10-CM | POA: Diagnosis not present

## 2016-01-16 DIAGNOSIS — Z23 Encounter for immunization: Secondary | ICD-10-CM | POA: Diagnosis not present

## 2016-01-16 DIAGNOSIS — I2510 Atherosclerotic heart disease of native coronary artery without angina pectoris: Secondary | ICD-10-CM | POA: Diagnosis not present

## 2016-01-17 ENCOUNTER — Other Ambulatory Visit: Payer: Self-pay | Admitting: Family Medicine

## 2016-01-20 DIAGNOSIS — E113591 Type 2 diabetes mellitus with proliferative diabetic retinopathy without macular edema, right eye: Secondary | ICD-10-CM | POA: Diagnosis not present

## 2016-01-20 DIAGNOSIS — H43822 Vitreomacular adhesion, left eye: Secondary | ICD-10-CM | POA: Diagnosis not present

## 2016-01-20 DIAGNOSIS — H35371 Puckering of macula, right eye: Secondary | ICD-10-CM | POA: Diagnosis not present

## 2016-01-20 DIAGNOSIS — E113512 Type 2 diabetes mellitus with proliferative diabetic retinopathy with macular edema, left eye: Secondary | ICD-10-CM | POA: Diagnosis not present

## 2016-01-21 ENCOUNTER — Encounter: Payer: Self-pay | Admitting: Family Medicine

## 2016-01-21 ENCOUNTER — Ambulatory Visit (INDEPENDENT_AMBULATORY_CARE_PROVIDER_SITE_OTHER): Payer: Medicare Other | Admitting: Family Medicine

## 2016-01-21 VITALS — BP 142/86 | Temp 97.8°F | Ht 69.0 in | Wt 241.8 lb

## 2016-01-21 DIAGNOSIS — J019 Acute sinusitis, unspecified: Secondary | ICD-10-CM | POA: Diagnosis not present

## 2016-01-21 DIAGNOSIS — B9689 Other specified bacterial agents as the cause of diseases classified elsewhere: Secondary | ICD-10-CM

## 2016-01-21 DIAGNOSIS — I255 Ischemic cardiomyopathy: Secondary | ICD-10-CM

## 2016-01-21 MED ORDER — CEFDINIR 300 MG PO CAPS: 300.0000 mg | ORAL_CAPSULE | Freq: Two times a day (BID) | ORAL | 0 refills | Status: DC

## 2016-01-21 NOTE — Progress Notes (Signed)
   Subjective:    Patient ID: Lance [name], male    DOB: [birth year], 70 y.o.   MRN: [id number]  Sinus Problem  This is a new problem. The current episode started in the past 7 days. Associated symptoms include congestion, coughing, ear pain and headaches. (Wheezing, diarrhea)   INHALER USED SOME LAST FEW DAYS   Ear ache and head ache    Frontal Headache diminished energy.  Occasional wheezing needs albuterol.    Review of Systems  HENT: Positive for congestion and ear pain.   Respiratory: Positive for cough.   Neurological: Positive for headaches.       Objective:   Physical Exam Alert, mild malaise. Hydration good Vitals stable. frontal/ maxillary tenderness evident positive nasal congestion. pharynx normal neck supple  lungs clear/no crackles or wheezes. heart regular in rhythm        Assessment & Plan:  Impression rhinosinusitis likely post viral, discussed with patient. plan antibiotics prescribed. Questions answered. Symptomatic care discussed. warning signs discussed. WSL

## 2016-01-27 DIAGNOSIS — E1165 Type 2 diabetes mellitus with hyperglycemia: Secondary | ICD-10-CM | POA: Diagnosis not present

## 2016-01-28 LAB — CUP PACEART REMOTE DEVICE CHECK
Battery Remaining Longevity: 54 mo
Battery Remaining Percentage: 58 %
Brady Statistic RA Percent Paced: 1 % — CL
Brady Statistic RV Percent Paced: 1 % — CL
Date Time Interrogation Session: 20170927150656
HighPow Impedance: 61 Ohm
Implantable Lead Implant Date: 20050706
Implantable Lead Implant Date: 20161128
Implantable Lead Location: 753859
Implantable Lead Location: 753860
Implantable Lead Model: 7122
Lead Channel Impedance Value: 360 Ohm
Lead Channel Impedance Value: 540 Ohm
Lead Channel Sensing Intrinsic Amplitude: 11.3 mV
Lead Channel Sensing Intrinsic Amplitude: 3 mV
Lead Channel Setting Pacing Amplitude: 2 V
Lead Channel Setting Pacing Amplitude: 2.5 V
Lead Channel Setting Pacing Pulse Width: 0.5 ms
Lead Channel Setting Sensing Sensitivity: 0.5 mV
Pulse Gen Serial Number: 7041246

## 2016-02-05 ENCOUNTER — Ambulatory Visit (INDEPENDENT_AMBULATORY_CARE_PROVIDER_SITE_OTHER): Payer: Medicare Other | Admitting: Nurse Practitioner

## 2016-02-05 ENCOUNTER — Encounter: Payer: Self-pay | Admitting: Nurse Practitioner

## 2016-02-05 VITALS — BP 142/84 | Temp 98.6°F | Ht 69.0 in | Wt 242.0 lb

## 2016-02-05 DIAGNOSIS — M722 Plantar fascial fibromatosis: Secondary | ICD-10-CM | POA: Diagnosis not present

## 2016-02-05 DIAGNOSIS — I255 Ischemic cardiomyopathy: Secondary | ICD-10-CM

## 2016-02-05 MED ORDER — DICLOFENAC SODIUM 1 % TD GEL: 2.0000 g | Freq: Four times a day (QID) | TRANSDERMAL | 0 refills | Status: DC

## 2016-02-05 NOTE — Patient Instructions (Signed)
Lidoderm patch Biofreeze   Fasciitis Plantar fasciitis is a painful foot condition that affects the heel. It occurs when the band of tissue that connects the toes to the heel bone (plantar fascia) becomes irritated. This can happen after exercising too much or doing other repetitive activities (overuse injury). The pain from plantar fasciitis can range from mild irritation to severe pain that makes it difficult for you to walk or move. The pain is usually worse in the morning or after you have been sitting or lying down for a while. CAUSES This condition may be caused by:  Standing for long periods of time.  Wearing shoes that do not fit.  Doing high-impact activities, including running, aerobics, and ballet.  Being overweight.  Having an abnormal way of walking (gait).  Having tight calf muscles.  Having high arches in your feet.  Starting a new athletic activity. SYMPTOMS The main symptom of this condition is heel pain. Other symptoms include:  Pain that gets worse after activity or exercise.  Pain that is worse in the morning or after resting.  Pain that goes away after you walk for a few minutes. DIAGNOSIS This condition may be diagnosed based on your signs and symptoms. Your health care Bartley Vuolo will also do a physical exam to check for:  A tender area on the bottom of your foot.  A high arch in your foot.  Pain when you move your foot.  Difficulty moving your foot. You may also need to have imaging studies to confirm the diagnosis. These can include:  X-rays.  Ultrasound.  MRI. TREATMENT  Treatment for plantar fasciitis depends on the severity of the condition. Your treatment may include:  Rest, ice, and over-the-counter pain medicines to manage your pain.  Exercises to stretch your calves and your plantar fascia.  A splint that holds your foot in a stretched, upward position while you sleep (night splint).  Physical therapy to relieve symptoms and  prevent problems in the future.  Cortisone injections to relieve severe pain.  Extracorporeal shock wave therapy (ESWT) to stimulate damaged plantar fascia with electrical impulses. It is often used as a last resort before surgery.  Surgery, if other treatments have not worked after 12 months. HOME CARE INSTRUCTIONS  Take medicines only as directed by your health care Ivionna Verley.  Avoid activities that cause pain.  Roll the bottom of your foot over a bag of ice or a bottle of cold water. Do this for 20 minutes, 3-4 times a day.  Perform simple stretches as directed by your health care Christhoper Busbee.  Try wearing athletic shoes with air-sole or gel-sole cushions or soft shoe inserts.  Wear a night splint while sleeping, if directed by your health care Geoffrey Hynes.  Keep all follow-up appointments with your health care Sevana Grandinetti. PREVENTION   Do not perform exercises or activities that cause heel pain.  Consider finding low-impact activities if you continue to have problems.  Lose weight if you need to. The best way to prevent plantar fasciitis is to avoid the activities that aggravate your plantar fascia. SEEK MEDICAL CARE IF:  Your symptoms do not go away after treatment with home care measures.  Your pain gets worse.  Your pain affects your ability to move or do your daily activities.   This information is not intended to replace advice given to you by your health care Traeh Milroy. Make sure you discuss any questions you have with your health care Evett Kassa.   Document Released: 01/12/2001 Document Revised: 01/08/2015  Document Reviewed: 02/27/2014 Elsevier Interactive Patient Education [employer].

## 2016-02-06 ENCOUNTER — Encounter: Payer: Self-pay | Admitting: Nurse Practitioner

## 2016-02-06 NOTE — Progress Notes (Signed)
Subjective:  Presents with complaints of pain in the medial arch of the left foot over the past several days. Does not remember any specific history of injury. Has a history of diabetic neuropathy and sees podiatry on a regular basis. Has an appointment next week. Has specialized shoe inserts that he removed to see if this would help, no improvement. Bought a $200 pair of shoes from [employer] with no relief.  Objective:   BP (!) 142/84   Temp 98.6 F (37 C) (Oral)   Ht 5\' 9"  (1.753 m)   Wt 242 lb (109.8 kg)   BMI 35.74 kg/m  NAD. Alert, oriented. A faint bluish area noted in the middle of the arch of the foot left side. Area moderately tender to palpation. No erythema warmth or edema.  Assessment: Plantar fasciitis of left foot  Plan: Follow-up with podiatry as planned. Options are limited due to his current health status and medications. Given information on plantar fasciitis. Appears to have an ecchymotic area may be causing discomfort. Lidocaine patch of Biofreeze as directed. Meds ordered this encounter  Medications  . DISCONTD: diclofenac sodium (VOLTAREN) 1 % GEL    Sig: Apply 2 g topically 4 (four) times daily.    Dispense:  1 Tube    Refill:  0    Order Specific Question:   Supervising Provider    Answer:   Mikey Kirschner [[id number]]  . diclofenac sodium (VOLTAREN) 1 % GEL    Sig: Apply 2 g topically 4 (four) times daily.    Dispense:  1 Tube    Refill:  0   No x-ray today with no history of injury; podiatry may perform this in the office.

## 2016-02-11 DIAGNOSIS — M47812 Spondylosis without myelopathy or radiculopathy, cervical region: Secondary | ICD-10-CM | POA: Diagnosis not present

## 2016-02-11 DIAGNOSIS — M791 Myalgia: Secondary | ICD-10-CM | POA: Diagnosis not present

## 2016-02-11 DIAGNOSIS — M542 Cervicalgia: Secondary | ICD-10-CM | POA: Diagnosis not present

## 2016-02-12 DIAGNOSIS — M1711 Unilateral primary osteoarthritis, right knee: Secondary | ICD-10-CM | POA: Diagnosis not present

## 2016-02-12 DIAGNOSIS — M25561 Pain in right knee: Secondary | ICD-10-CM | POA: Diagnosis not present

## 2016-02-13 ENCOUNTER — Other Ambulatory Visit: Payer: Self-pay

## 2016-02-13 DIAGNOSIS — J45909 Unspecified asthma, uncomplicated: Secondary | ICD-10-CM

## 2016-02-13 DIAGNOSIS — R0600 Dyspnea, unspecified: Secondary | ICD-10-CM

## 2016-02-13 DIAGNOSIS — R0602 Shortness of breath: Secondary | ICD-10-CM

## 2016-02-13 NOTE — Progress Notes (Signed)
Requested by Lowella Curb to place new ref to pulmonary rehab with dx asthma,sob,dyspnea-done

## 2016-02-24 DIAGNOSIS — H2513 Age-related nuclear cataract, bilateral: Secondary | ICD-10-CM | POA: Diagnosis not present

## 2016-02-24 DIAGNOSIS — H5203 Hypermetropia, bilateral: Secondary | ICD-10-CM | POA: Diagnosis not present

## 2016-02-24 DIAGNOSIS — H52223 Regular astigmatism, bilateral: Secondary | ICD-10-CM | POA: Diagnosis not present

## 2016-02-24 DIAGNOSIS — E11319 Type 2 diabetes mellitus with unspecified diabetic retinopathy without macular edema: Secondary | ICD-10-CM | POA: Diagnosis not present

## 2016-02-25 ENCOUNTER — Other Ambulatory Visit: Payer: Self-pay

## 2016-02-26 ENCOUNTER — Encounter (HOSPITAL_COMMUNITY)
Admission: RE | Admit: 2016-02-26 | Discharge: 2016-02-26 | Disposition: A | Discharge status: Home / Self Care | Payer: Medicare Other | Source: Ambulatory Visit | Attending: Cardiology | Admitting: Cardiology

## 2016-02-26 ENCOUNTER — Encounter (HOSPITAL_COMMUNITY): Payer: Self-pay

## 2016-02-26 VITALS — BP 130/72 | HR 69 | Ht 69.0 in | Wt 236.8 lb

## 2016-02-26 DIAGNOSIS — J45909 Unspecified asthma, uncomplicated: Secondary | ICD-10-CM | POA: Insufficient documentation

## 2016-02-26 DIAGNOSIS — R0602 Shortness of breath: Secondary | ICD-10-CM | POA: Insufficient documentation

## 2016-02-26 NOTE — Progress Notes (Signed)
Cardiac/Pulmonary Rehab Medication Review by a Pharmacist  Does the patient  feel that his/her medications are working for him/her?  yes  Has the patient been experiencing any side effects to the medications prescribed?  no  Does the patient measure his/her own blood pressure or blood glucose at home?  yes   Does the patient have any problems obtaining medications due to transportation or finances?   no  Understanding of regimen: poor Understanding of indications: fair Potential of compliance: excellent  Questions asked to Determine Patient Understanding of Medication Regimen:  1. What is the name of the medication?  2. What is the medication used for?  3. When should it be taken?  4. How much should be taken?  5. How will you take it?  6. What side effects should you report?  Understanding Defined as: Excellent: All questions above are correct Good: Questions 1-4 are correct Fair: Questions 1-2 are correct  Poor: 1 or none of the above questions are correct   Pharmacist comments: Pt does not c/o any side effects from medication.  Pt states he does check his BP and CBGs at home and is on SSI.  Some doses of medications on patients med bottles are different from what's in computer.  Pt states dose on bottles are most accurate and med list updated.  Some question about Januvia vs Jardiance on med list and which he is taking.  Also has Lantus and Levemir on printed list from home.  I suspect pt only uses Levemir at bedtime and SSI during the day per his best recollection.  Pt states his wife manages his meds for him and she is not present today.  Hart Robinsons A 02/26/2016 11:00 AM

## 2016-02-26 NOTE — Progress Notes (Signed)
Pulmonary Individual Treatment Plan  Patient Details  Name: Lance [name] MRN: [id number] Date of Birth: 08/24/1945 Referring Provider:   Flowsheet Row PULMONARY REHAB OTHER RESP ORIENTATION from 02/26/2016 in [place]  Referring Provider  Dr. Harl Bowie      Initial Encounter Date:  Flowsheet Row PULMONARY REHAB OTHER RESP ORIENTATION from 02/26/2016 in [place]  Date  02/26/16  Referring Provider  Dr. Harl Bowie      Visit Diagnosis: SOB (shortness of breath)  Asthma in adult without complication, unspecified asthma severity, unspecified whether persistent  Patient's Home Medications on Admission:   Current Outpatient Prescriptions:  .  empagliflozin (JARDIANCE) 10 MG TABS tablet, Take 10 mg by mouth daily., Disp: , Rfl:  .  guaiFENesin (MUCINEX) 600 MG 12 hr tablet, Take 1,200 mg by mouth 2 (two) times daily as needed for to loosen phlegm., Disp: , Rfl:  .  HYDROcodone-acetaminophen (LORTAB) 7.5-500 MG tablet, Take 1 tablet by mouth every 6 (six) hours as needed for pain., Disp: , Rfl:  .  albuterol (PROVENTIL HFA;VENTOLIN HFA) 108 (90 BASE) MCG/ACT inhaler, Inhale 2 puffs into the lungs every 6 (six) hours as needed for wheezing., Disp: 1 Inhaler, Rfl: 2 .  albuterol (PROVENTIL) (2.5 MG/3ML) 0.083% nebulizer solution, Take 3 mLs (2.5 mg total) by nebulization every 6 (six) hours as needed for wheezing or shortness of breath., Disp: 150 mL, Rfl: 0 .  ALPRAZolam (XANAX) 1 MG tablet, TAKE 1/2-1 TABLET 2 TIMES A DAY AS NEEDED, Disp: 60 tablet, Rfl: 3 .  amLODipine (NORVASC) 2.5 MG tablet, Take 1 tablet (2.5 mg total) by mouth daily., Disp: 30 tablet, Rfl: 11 .  aspirin 81 MG tablet, Take 81 mg by mouth daily., Disp: , Rfl:  .  atorvastatin (LIPITOR) 80 MG tablet, Take 80 mg by mouth daily. , Disp: , Rfl:  .  budesonide-formoterol (SYMBICORT) 160-4.5 MCG/ACT inhaler, Inhale 2 puffs into the lungs 2 (two) times daily., Disp: 1 Inhaler, Rfl:  3 .  clopidogrel (PLAVIX) 75 MG tablet, TAKE ONE TABLET EVERY MORNING WITH BREAKFAST, Disp: 30 tablet, Rfl: 5 .  diclofenac sodium (VOLTAREN) 1 % GEL, Apply 2 g topically 4 (four) times daily., Disp: 1 Tube, Rfl: 0 .  dicyclomine (BENTYL) 10 MG capsule, TAKE ONE CAPSULE EVERY MORNING AND ONE CAPSULE AT BEDTIME AS NEEDED FOR INCREASED STOOLS AND BLOATING, Disp: 60 capsule, Rfl: 5 .  donepezil (ARICEPT) 10 MG tablet, Take 1 tablet (10 mg total) by mouth at bedtime., Disp: 90 tablet, Rfl: 3 .  DULoxetine (CYMBALTA) 60 MG capsule, TAKE ONE (1) CAPSULE EACH DAY, Disp: 30 capsule, Rfl: 5 .  fexofenadine (ALLEGRA) 180 MG tablet, Take 180 mg by mouth daily as needed. , Disp: , Rfl:  .  furosemide (LASIX) 20 MG tablet, Take 1 tablet (20 mg total) by mouth daily., Disp: 90 tablet, Rfl: 3 .  gabapentin (NEURONTIN) 300 MG capsule, TAKE ONE TO TWO CAPSULES BY MOUTH THREE TIMES AS DAY AS NEEDED AS NEEDED, Disp: 180 capsule, Rfl: 6 .  glucose blood (ONE TOUCH ULTRA TEST) test strip, Use as instructed 4 times daily. E11.65, Disp: 150 each, Rfl: 0 .  HUMALOG KWIKPEN 100 UNIT/ML KiwkPen, Inject 0.32-0.38 mLs (32-38 Units total) into the skin 3 (three) times daily., Disp: 30 pen, Rfl: 2 .  Insulin Detemir (LEVEMIR FLEXTOUCH) 100 UNIT/ML Pen, Inject 90 Units into the skin daily at 10 pm., Disp: 30 pen, Rfl: 2 .  Insulin Pen Needle 31G X 6 MM  MISC, Use as directed 4 x daily, Disp: 150 each, Rfl: 11 .  losartan (COZAAR) 25 MG tablet, Take 1 tablet (25 mg total) by mouth daily. (Patient taking differently: Take 50 mg by mouth daily. ), Disp: 30 tablet, Rfl: 11 .  magnesium oxide (MAG-OX) 400 MG tablet, Take 1 tablet (400 mg total) by mouth daily., Disp: 30 tablet, Rfl: 3 .  metoprolol tartrate (LOPRESSOR) 25 MG tablet, Take 1 tablet (25 mg total) by mouth 2 (two) times daily. (Patient taking differently: Take 50 mg by mouth 2 (two) times daily. ), Disp: 60 tablet, Rfl: 11 .  nitroGLYCERIN (NITROSTAT) 0.4 MG SL tablet,  Place 1 tablet (0.4 mg total) under the tongue every 5 (five) minutes as needed. For chest pain (Patient taking differently: Place 0.4 mg under the tongue every 5 (five) minutes as needed. For chest pain), Disp: 25 tablet, Rfl: 3 .  olopatadine (PATANOL) 0.1 % ophthalmic solution, Place 1 drop into both eyes 2 (two) times daily., Disp: 5 mL, Rfl: 12 .  pantoprazole (PROTONIX) 40 MG tablet, TAKE ONE TABLET TWICE DAILY, Disp: 60 tablet, Rfl: 5 .  Probiotic Product (ALIGN PO), Take 1 tablet by mouth daily. , Disp: , Rfl:  .  sildenafil (REVATIO) 20 MG tablet, Take 3 -5 tabs as needed for erectile dysfunction, Disp: 20 tablet, Rfl: 4 .  traZODone (DESYREL) 100 MG tablet, TAKE ONE-HALF TO ONE TABLET AT BEDTIME AS NEEDED FOR SLEEP, Disp: 30 tablet, Rfl: 5 .  vitamin B-12 (CYANOCOBALAMIN) 1000 MCG tablet, Take 1,000 mcg by mouth daily. Reported on 08/25/2015, Disp: , Rfl:  .  Vitamin D, Ergocalciferol, (DRISDOL) 50000 units CAPS capsule, TAKE ONE CAPSULE BY MOUTH ONCE A WEEK (Patient taking differently: TAKE ONE CAPSULE BY MOUTH ONCE A MONTH -3RD OF MONTH), Disp: 12 capsule, Rfl: 0  Past Medical History: Past Medical History:  Diagnosis Date  . Allergic rhinitis, cause unspecified   . Anxiety and depression   . ASCVD (arteriosclerotic cardiovascular disease)    70% mid left anterior descending lesion on cath in 06/1995; left anterior desending DES placed in 8/03 and RCA stent in 9/03; captain 3/05 revealed 90% second marginal for which PCI was performed, 70% PDA and a total obstruction of the first diagonal and marginal; sudden cardiac death in [place] in 07-Nov-2003 for which automatic implantable cardiac defibrillator placed; negativ stress nuclear 10/07  . Atrial fibrillation ([place])   . Benign prostatic hypertrophy   . C. difficile diarrhea   . CAD (coronary artery disease) 1997  . CHF (congestive heart failure) ([place])   . COPD (chronic obstructive pulmonary disease) ([place])   . Coronary artery disease    . CVD (cerebrovascular disease) 05/2008   Transient ischemic attack; carotid ultrasound-plaque without focal disease  . Degenerative joint disease 2002   C-spine fusion   . Diabetes mellitus    Insulin requirement  . Diabetes mellitus without mention of complication   . Diabetic peripheral neuropathy ([place]) 09/06/2014  . Erectile dysfunction   . Esophageal reflux   . HOH (hard of hearing)   . Hx-TIA (transient ischemic attack) 2010  . Hyperlipidemia   . Hypertension   . ICD (implantable cardiac defibrillator) in place 2005   [employer] ICD, for SCD  . Memory deficits 09/05/2013  . Myocardial infarction   . Other testicular hypofunction   . Pacemaker   . S/P endoscopy Dec 2011   RMR: nl esophagus, hyperplastic polyp, active gastritis, no H.pylori.   . Shortness of breath   .  Stroke ([place])   . Tobacco abuse    100 pack/year comsuption; cigarettes discontinued 2003; all tobacco products in 2008  . Tubular adenoma   . Vitreous floaters of left eye     Tobacco Use: History  Smoking Status  . Former Smoker  . Packs/day: 3.00  . Years: 40.00  . Types: Cigarettes  . Start date: 05/03/1954  . Quit date: 05/03/1998  Smokeless Tobacco  . Current User  . Types: Chew    Labs: Recent Review Flowsheet Data    Labs for ITP Cardiac and Pulmonary Rehab Latest Ref Rng & Units 07/25/2013 11/06/2014 02/06/2015 05/15/2015 11/21/2015   Cholestrol 125 - 200 mg/dL 154 - - - 125   LDLCALC <130 mg/dL 92 - - - 55   HDL >=40 mg/dL 31(L) - - - 34(L)   Trlycerides <150 mg/dL 155(H) - - - 178(H)   Hemoglobin A1c <5.7 % - 10.2(A) 9.4(A) 9.9(H) 9.0(H)   PHART 7.350 - 7.450 - - - - -   PCO2ART 35.0 - 45.0 mmHg - - - - -   HCO3 20.0 - 24.0 mEq/L - - - - -   TCO2 0 - 100 mmol/L - - - - -   ACIDBASEDEF 0.0 - 2.0 mmol/L - - - - -   O2SAT % - - - - -      Capillary Blood Glucose: Lab Results  Component Value Date   GLUCAP 166 (H) 07/09/2015   GLUCAP 287 (H) 07/08/2015   GLUCAP 222 (H) 07/08/2015   GLUCAP  118 (H) 07/08/2015   GLUCAP 149 (H) 07/08/2015     ADL UCSD:     Pulmonary Assessment Scores    Row Name 02/26/16 1340         ADL UCSD   ADL Phase Entry     SOB Score total 114     Rest 2     Walk 6     Stairs 4     Bath 2     Dress 2     Shop 3       CAT Score   CAT Score 26       mMRC Score   mMRC Score 4        Pulmonary Function Assessment:     Pulmonary Function Assessment - 02/26/16 1337      Pulmonary Function Tests   FVC% 87 %   FEV1% 88 %   FEV1/FVC Ratio 101   RV% 111 %   DLCO% 92 %     Initial Spirometry Results   FVC% 87 %   FEV1% 88 %   FEV1/FVC Ratio 101     Post Bronchodilator Spirometry Results   FVC% 88 %   FEV1% 94 %   FEV1/FVC Ratio 106     Breath   Shortness of Breath Yes;Limiting activity      Exercise Target Goals: Date: 02/26/16  Exercise Program Goal: Individual exercise prescription set with THRR, safety & activity barriers. Participant demonstrates ability to understand and report RPE using BORG scale, to self-measure pulse accurately, and to acknowledge the importance of the exercise prescription.  Exercise Prescription Goal: Starting with aerobic activity 30 plus minutes a day, 3 days per week for initial exercise prescription. Provide home exercise prescription and guidelines that participant acknowledges understanding prior to discharge.  Activity Barriers & Risk Stratification:     Activity Barriers & Cardiac Risk Stratification - 02/26/16 1141      Activity Barriers & Cardiac Risk  Stratification   Activity Barriers Back Problems;Left Knee Replacement;Joint Problems   Cardiac Risk Stratification High      6 Minute Walk:     6 Minute Walk    Row Name 02/26/16 1134         6 Minute Walk   Phase Initial     Distance 950 feet     Distance % Change 0 %     Walk Time 6 minutes     # of Rest Breaks 0     MPH 1.79     METS 2.37     RPE 15     Perceived Dyspnea  14     VO2 Peak 6.7     Symptoms No      Resting HR 69 bpm     Resting BP 130/72     Max Ex. HR 82 bpm     Max Ex. BP 150/76     2 Minute Post BP 138/74        Initial Exercise Prescription:     Initial Exercise Prescription - 02/26/16 1000      Date of Initial Exercise RX and Referring Provider   Date 02/26/16   Referring Provider Dr. Harl Bowie     Treadmill   MPH 1   Grade 0   Minutes 15   METs 1.6     Arm Ergometer   Level 2   Watts 15   Minutes 20   METs 1.9     Prescription Details   Frequency (times per week) 3   Duration Progress to 30 minutes of continuous aerobic without signs/symptoms of physical distress     Intensity   THRR REST +  20   THRR 40-80% of Max Heartrate [phone]   Ratings of Perceived Exertion 11-13   Perceived Dyspnea 0-4     Progression   Progression Continue progressive overload as per policy without signs/symptoms or physical distress.     Resistance Training   Training Prescription Yes   Weight 1   Reps 10-12      Perform Capillary Blood Glucose checks as needed.  Exercise Prescription Changes:   Exercise Comments:   Discharge Exercise Prescription (Final Exercise Prescription Changes):   Nutrition:  Target Goals: Understanding of nutrition guidelines, daily intake of sodium 1500mg , cholesterol 200mg , calories 30% from fat and 7% or less from saturated fats, daily to have 5 or more servings of fruits and vegetables.  Biometrics:     Pre Biometrics - 02/26/16 1037      Pre Biometrics   Height 5\' 9"  (1.753 m)   Weight 236 lb 12.4 oz (107.4 kg)   Waist Circumference 44.5 inches   Hip Circumference 42.5 inches   Waist to Hip Ratio 1.05 %   BMI (Calculated) 35   Triceps Skinfold 6 mm   % Body Fat 28.6 %   Grip Strength 81.33 kg   Flexibility 0 in   Single Leg Stand 3 seconds       Nutrition Therapy Plan and Nutrition Goals:   Nutrition Discharge: Rate Your Plate Scores:     Nutrition Assessments - 02/26/16 1344      Rate Your Plate  Scores   Pre Score 17      Psychosocial: Target Goals: Acknowledge presence or absence of depression, maximize coping skills, provide positive support system. Participant is able to verbalize types and ability to use techniques and skills needed for reducing stress and depression.  Initial Review & Psychosocial Screening:  Initial Psych Review & Screening - 02/26/16 1402      Initial Review   Current issues with Current Anxiety/Panic  Main concern is being active again w/o SOB.     Family Dynamics   Good Support System? Yes     Barriers   Psychosocial barriers to participate in program Psychosocial barriers identified (see note);The patient should benefit from training in stress management and relaxation.  Though the patients QOL was 23.09, his PHQ-9 was 9. He said he feels depressed and hopeless. He siad it is more based in the fact he can't do anything without getting breathless.       Screening Interventions   Interventions Encouraged to exercise      Quality of Life Scores:     Quality of Life - 02/26/16 1038      Quality of Life Scores   Health/Function Pre 19.5 %   Socioeconomic Pre 24.86 %   Psych/Spiritual Pre 29.14 %   Family Pre 24 %   GLOBAL Pre 23.09 %      PHQ-9: Recent Review Flowsheet Data    Depression screen [facility] 2/9 02/26/2016 11/25/2015 08/25/2015 07/04/2015 06/04/2015   Decreased Interest 3 0 0 0 0   Down, Depressed, Hopeless 3 0 0 0 0   PHQ - 2 Score 6 0 0 0 0   Altered sleeping 0 - - - -   Tired, decreased energy 3 - - - -   Change in appetite 0 - - - -   Feeling bad or failure about yourself  0 - - - -   Trouble concentrating 0 - - - -   Moving slowly or fidgety/restless 0 - - - -   PHQ-9 Score 9 - - - -   Difficult doing work/chores Not difficult at all - - - -      Psychosocial Evaluation and Intervention:     Psychosocial Evaluation - 02/26/16 1406      Psychosocial Evaluation & Interventions   Interventions Stress management  education;Relaxation education;Encouraged to exercise with the program and follow exercise prescription   Continued Psychosocial Services Needed Yes  The patient does not feel he needs counseling. From my observation he would benefit from consulting with a counselor.       Psychosocial Re-Evaluation:   Education: Education Goals: Education classes will be provided on a weekly basis, covering required topics. Participant will state understanding/return demonstration of topics presented.  Learning Barriers/Preferences:     Learning Barriers/Preferences - 02/26/16 1142      Learning Barriers/Preferences   Learning Barriers None   Learning Preferences Individual Instruction;Skilled Demonstration;Group Instruction      Education Topics: How Lungs Work and Diseases: - Discuss the anatomy of the lungs and diseases that can affect the lungs, such as COPD.   Exercise: -Discuss the importance of exercise, FITT principles of exercise, normal and abnormal responses to exercise, and how to exercise safely.   Environmental Irritants: -Discuss types of environmental irritants and how to limit exposure to environmental irritants.   Meds/Inhalers and oxygen: - Discuss respiratory medications, definition of an inhaler and oxygen, and the proper way to use an inhaler and oxygen.   Energy Saving Techniques: - Discuss methods to conserve energy and decrease shortness of breath when performing activities of daily living.    Bronchial Hygiene / Breathing Techniques: - Discuss breathing mechanics, pursed-lip breathing technique,  proper posture, effective ways to clear airways, and other functional breathing techniques   Cleaning Equipment: - Provides  group verbal and written instruction about the health risks of elevated stress, cause of high stress, and healthy ways to reduce stress.   Nutrition I: Fats: - Discuss the types of cholesterol, what cholesterol does to the body, and how  cholesterol levels can be controlled.   Nutrition II: Labels: -Discuss the different components of food labels and how to read food labels.   Respiratory Infections: - Discuss the signs and symptoms of respiratory infections, ways to prevent respiratory infections, and the importance of seeking medical treatment when having a respiratory infection.   Stress I: Signs and Symptoms: - Discuss the causes of stress, how stress may lead to anxiety and depression, and ways to limit stress.   Stress II: Relaxation: -Discuss relaxation techniques to limit stress.   Oxygen for Home/Travel: - Discuss how to prepare for travel when on oxygen and proper ways to transport and store oxygen to ensure safety.   Knowledge Questionnaire Score:     Knowledge Questionnaire Score - 02/26/16 1335      Knowledge Questionnaire Score   Pre Score 10/14      Core Components/Risk Factors/Patient Goals at Admission:     Personal Goals and Risk Factors at Admission - 02/26/16 1344      Core Components/Risk Factors/Patient Goals on Admission    Weight Management Yes   Intervention Weight Management/Obesity: Establish reasonable short term and long term weight goals.   Admit Weight 237 lb (107.5 kg)   Goal Weight: Short Term 227 lb (103 kg)   Goal Weight: Long Term 217 lb (98.4 kg)   Expected Outcomes Short Term: Continue to assess and modify interventions until short term weight is achieved;Long Term: Adherence to nutrition and physical activity/exercise program aimed toward attainment of established weight goal   Sedentary Yes   Intervention Provide advice, education, support and counseling about physical activity/exercise needs.;Develop an individualized exercise prescription for aerobic and resistive training based on initial evaluation findings, risk stratification, comorbidities and participant's personal goals.   Expected Outcomes Achievement of increased cardiorespiratory fitness and enhanced  flexibility, muscular endurance and strength shown through measurements of functional capacity and personal statement of participant.   Increase Strength and Stamina Yes   Intervention Provide advice, education, support and counseling about physical activity/exercise needs.;Develop an individualized exercise prescription for aerobic and resistive training based on initial evaluation findings, risk stratification, comorbidities and participant's personal goals.   Expected Outcomes Achievement of increased cardiorespiratory fitness and enhanced flexibility, muscular endurance and strength shown through measurements of functional capacity and personal statement of participant.   Improve shortness of breath with ADL's Yes   Intervention Provide education, individualized exercise plan and daily activity instruction to help decrease symptoms of SOB with activities of daily living.   Expected Outcomes Short Term: Achieves a reduction of symptoms when performing activities of daily living.   Develop more efficient breathing techniques such as purse lipped breathing and diaphragmatic breathing; and practicing self-pacing with activity Yes   Intervention Provide education, demonstration and support about specific breathing techniuqes utilized for more efficient breathing. Include techniques such as pursed lipped breathing, diaphragmatic breathing and self-pacing activity.   Expected Outcomes Short Term: Participant will be able to demonstrate and use breathing techniques as needed throughout daily activities.   Diabetes Yes   Intervention Provide education about signs/symptoms and action to take for hypo/hyperglycemia.   Expected Outcomes Long Term: Attainment of HbA1C < 7%.;Short Term: Participant verbalizes understanding of the signs/symptoms and immediate care of hyper/hypoglycemia, proper foot care  and importance of medication, aerobic/resistive exercise and nutrition plan for blood glucose control.   Stress  Yes   Intervention Offer individual and/or small group education and counseling on adjustment to heart disease, stress management and health-related lifestyle change. Teach and support self-help strategies.   Expected Outcomes Short Term: Participant demonstrates changes in health-related behavior, relaxation and other stress management skills, ability to obtain effective social support, and compliance with psychotropic medications if prescribed.;Long Term: Emotional wellbeing is indicated by absence of clinically significant psychosocial distress or social isolation.   Personal Goal Other Yes   Personal Goal Lose weight 10-20lbs before graduation. then continue to lose up to 50lbs. Breath better.   Intervention Attend Pulmonary Rehab 2 x week and then supplement exercise at home with 3 x week.    Expected Outcomes Reach personal goals.       Core Components/Risk Factors/Patient Goals Review:      Goals and Risk Factor Review    Row Name 02/26/16 1402             Core Components/Risk Factors/Patient Goals Review   Personal Goals Review Weight Management/Obesity;Improve shortness of breath with ADL's;Develop more efficient breathing techniques such as purse lipped breathing and diaphragmatic breathing and practicing self-pacing with activity.;Diabetes;Stress          Core Components/Risk Factors/Patient Goals at Discharge (Final Review):      Goals and Risk Factor Review - 02/26/16 1402      Core Components/Risk Factors/Patient Goals Review   Personal Goals Review Weight Management/Obesity;Improve shortness of breath with ADL's;Develop more efficient breathing techniques such as purse lipped breathing and diaphragmatic breathing and practicing self-pacing with activity.;Diabetes;Stress      ITP Comments:   Comments: Patient arrived for 1st visit/orientation/education at 10:00. Patient was referred to PR by Dr. Harl Bowie due to SOB (R06.02) and Asthma (J45.909). During orientation  advised patient on arrival and appointment times what to wear, what to do before, during and after exercise. Reviewed attendance and class policy. Talked about inclement weather and class consultation policy. Pt is scheduled to return to Pulmonary Rehab on 03/02/16 at 10:30. Pt was advised to come to class 15 minutes before class starts. Patient was also given instructions on meeting with the dietician and attending the Family Structure classes. Pt is eager to get started. Patient participated in warm-up stretches along with light weights and resistance bands. Patient was able to complete 6 minute walk test. Patient was measured for the equipment. Discussed equipment safety with patient. Discussed pursed lip breathing. Handouts were given and patient was encouraged to practice the technique at home. Took patient pre-anthropometric measurements. Patient finished visit at 11:30.

## 2016-02-27 ENCOUNTER — Other Ambulatory Visit: Payer: Self-pay | Admitting: "Endocrinology

## 2016-02-27 ENCOUNTER — Other Ambulatory Visit: Payer: Self-pay

## 2016-02-27 MED ORDER — GLUCOSE BLOOD VI STRP: ORAL_STRIP | 1 refills | Status: DC

## 2016-03-02 ENCOUNTER — Encounter (HOSPITAL_COMMUNITY)
Admission: RE | Admit: 2016-03-02 | Discharge: 2016-03-02 | Disposition: A | Discharge status: Home / Self Care | Payer: Medicare Other | Source: Ambulatory Visit | Attending: Cardiology | Admitting: Cardiology

## 2016-03-02 DIAGNOSIS — J45909 Unspecified asthma, uncomplicated: Secondary | ICD-10-CM | POA: Diagnosis not present

## 2016-03-02 DIAGNOSIS — R0602 Shortness of breath: Secondary | ICD-10-CM

## 2016-03-02 NOTE — Progress Notes (Signed)
Daily Session Note  Patient Details  Name: ZYEIR DYMEK MRN: [id number] Date of Birth: 12/17/1945 Referring Provider:   [place] from 02/26/2016 in [place]  Referring Provider  Dr. Harl Bowie      Encounter Date: 03/02/2016  Check In:     Session Check In - 03/02/16 1045      Check-In   Location [facility]   Staff Present Suzanne Boron, BS, EP, Exercise Physiologist   Supervising physician immediately available to respond to emergencies See telemetry face sheet for immediately available MD   Medication changes reported     No   Fall or balance concerns reported    No   Warm-up and Cool-down Performed as group-led instruction   Resistance Training Performed Yes   VAD Patient? No     Pain Assessment   Currently in Pain? No/denies   Pain Score 0-No pain   Multiple Pain Sites No      Capillary Blood Glucose: No results found for this or any previous visit (from the past 24 hour(s)).   Goals Met:  Independence with exercise equipment Exercise tolerated well No report of cardiac concerns or symptoms Strength training completed today  Goals Unmet:  Not Applicable  Comments: Check out 1145   Dr. Kate Sable is Medical Director for [place] and Pulmonary Rehab.

## 2016-03-03 ENCOUNTER — Other Ambulatory Visit: Payer: Self-pay | Admitting: Cardiology

## 2016-03-04 ENCOUNTER — Encounter (HOSPITAL_COMMUNITY)
Admission: RE | Admit: 2016-03-04 | Discharge: 2016-03-04 | Disposition: A | Discharge status: Home / Self Care | Payer: Medicare Other | Source: Ambulatory Visit | Attending: Cardiology | Admitting: Cardiology

## 2016-03-04 DIAGNOSIS — J45909 Unspecified asthma, uncomplicated: Secondary | ICD-10-CM | POA: Diagnosis not present

## 2016-03-04 DIAGNOSIS — R0602 Shortness of breath: Secondary | ICD-10-CM | POA: Insufficient documentation

## 2016-03-04 NOTE — Progress Notes (Signed)
Daily Session Note  Patient Details  Name: Lance [name] MRN: [id number] Date of Birth: Dec 03, 1945 Referring Provider:   Flowsheet Row PULMONARY REHAB OTHER RESP ORIENTATION from 02/26/2016 in [place]  Referring Provider  Dr. Harl Bowie      Encounter Date: 03/04/2016  Check In:     Session Check In - 03/04/16 1045      Check-In   Location [facility]   Staff Present Aundra Dubin, RN, BSN;Isiaha Greenup Luther Parody, BS, EP, Exercise Physiologist   Supervising physician immediately available to respond to emergencies See telemetry face sheet for immediately available MD   Medication changes reported     No   Fall or balance concerns reported    No   Warm-up and Cool-down Performed as group-led instruction   Resistance Training Performed Yes   VAD Patient? No     Pain Assessment   Currently in Pain? No/denies   Pain Score 0-No pain   Multiple Pain Sites No      Capillary Blood Glucose: No results found for this or any previous visit (from the past 24 hour(s)).   Goals Met:  Independence with exercise equipment Exercise tolerated well No report of cardiac concerns or symptoms Strength training completed today  Goals Unmet:  Not Applicable  Comments: Check out 1145   Dr. Kate Sable is Medical Director for [facility] and [facility].

## 2016-03-09 ENCOUNTER — Encounter (HOSPITAL_COMMUNITY)
Admission: RE | Admit: 2016-03-09 | Discharge: 2016-03-09 | Disposition: A | Discharge status: Home / Self Care | Payer: Medicare Other | Source: Ambulatory Visit | Attending: Cardiology | Admitting: Cardiology

## 2016-03-09 DIAGNOSIS — R0602 Shortness of breath: Secondary | ICD-10-CM

## 2016-03-09 DIAGNOSIS — J45909 Unspecified asthma, uncomplicated: Secondary | ICD-10-CM | POA: Diagnosis not present

## 2016-03-09 NOTE — Progress Notes (Signed)
Daily Session Note  Patient Details  Name: Lance [name] MRN: [id number] Date of Birth: [birth year] Referring Provider:   Flowsheet Row PULMONARY REHAB OTHER RESP ORIENTATION from 02/26/2016 in [place]  Referring Provider  Dr. Harl Bowie      Encounter Date: 03/09/2016  Check In:     Session Check In - 03/09/16 1045      Check-In   Location [facility]   Staff Present Diane Angelina Pih, MS, EP, [facility], Exercise Physiologist;Dakota Vanwart Luther Parody, BS, EP, Exercise Physiologist   Supervising physician immediately available to respond to emergencies See telemetry face sheet for immediately available MD   Medication changes reported     No   Fall or balance concerns reported    No   Warm-up and Cool-down Performed as group-led instruction   Resistance Training Performed Yes   VAD Patient? No     Pain Assessment   Currently in Pain? No/denies   Pain Score 0-No pain   Multiple Pain Sites No      Capillary Blood Glucose: No results found for this or any previous visit (from the past 24 hour(s)).   Goals Met:  Independence with exercise equipment Exercise tolerated well No report of cardiac concerns or symptoms Strength training completed today  Goals Unmet:  Not Applicable  Comments: Check out 1145   Dr. Kate Sable is Medical Director for [place] and [facility]monary Re[facility].

## 2016-03-10 DIAGNOSIS — M47812 Spondylosis without myelopathy or radiculopathy, cervical region: Secondary | ICD-10-CM | POA: Diagnosis not present

## 2016-03-10 DIAGNOSIS — M791 Myalgia: Secondary | ICD-10-CM | POA: Diagnosis not present

## 2016-03-10 DIAGNOSIS — M542 Cervicalgia: Secondary | ICD-10-CM | POA: Diagnosis not present

## 2016-03-11 ENCOUNTER — Encounter (HOSPITAL_COMMUNITY)
Admission: RE | Admit: 2016-03-11 | Discharge: 2016-03-11 | Disposition: A | Discharge status: Home / Self Care | Payer: Medicare Other | Source: Ambulatory Visit | Attending: Cardiology | Admitting: Cardiology

## 2016-03-11 DIAGNOSIS — R0602 Shortness of breath: Secondary | ICD-10-CM | POA: Diagnosis not present

## 2016-03-11 DIAGNOSIS — J45909 Unspecified asthma, uncomplicated: Secondary | ICD-10-CM | POA: Diagnosis not present

## 2016-03-11 NOTE — Progress Notes (Signed)
Daily Session Note  Patient Details  Name: Lance [name] MRN: [id number] Date of Birth: April 18, 1946 Referring Provider:   Flowsheet Row PULMONARY REHAB OTHER RESP ORIENTATION from 02/26/2016 in [place]  Referring Provider  Dr. Harl Bowie      Encounter Date: 03/11/2016  Check In:     Session Check In - 03/11/16 1045      Check-In   Location [facility]   Staff Present Suzanne Boron, BS, EP, Exercise Physiologist   Supervising physician immediately available to respond to emergencies See telemetry face sheet for immediately available MD   Medication changes reported     No   Fall or balance concerns reported    No   Warm-up and Cool-down Performed as group-led instruction   Resistance Training Performed Yes   VAD Patient? No     Pain Assessment   Currently in Pain? No/denies   Pain Score 0-No pain   Multiple Pain Sites No      Capillary Blood Glucose: No results found for this or any previous visit (from the past 24 hour(s)).   Goals Met:  Independence with exercise equipment Exercise tolerated well No report of cardiac concerns or symptoms Strength training completed today  Goals Unmet:  Not Applicable  Comments: Check out 1145   Dr. Kate Sable is Medical Director for [place] and Pul[facility] Re[facility].

## 2016-03-16 ENCOUNTER — Encounter (HOSPITAL_COMMUNITY)
Admission: RE | Admit: 2016-03-16 | Discharge: 2016-03-16 | Disposition: A | Discharge status: Home / Self Care | Payer: Medicare Other | Source: Ambulatory Visit | Attending: Cardiology | Admitting: Cardiology

## 2016-03-16 DIAGNOSIS — R0602 Shortness of breath: Secondary | ICD-10-CM | POA: Diagnosis not present

## 2016-03-16 DIAGNOSIS — J45909 Unspecified asthma, uncomplicated: Secondary | ICD-10-CM | POA: Diagnosis not present

## 2016-03-16 NOTE — Progress Notes (Signed)
Daily Session Note  Patient Details  Name: Lance [name] MRN: [id number] Date of Birth: Mar 08, 1946 Referring Provider:   Flowsheet Row PULMONARY REHAB OTHER RESP ORIENTATION from 02/26/2016 in [place]  Referring Provider  Dr. Harl Bowie      Encounter Date: 03/16/2016  Check In:     Session Check In - 03/16/16 1045      Check-In   Location [facility]   Staff Present Suzanne Boron, BS, EP, Exercise Physiologist   Supervising physician immediately available to respond to emergencies See telemetry face sheet for immediately available MD   Medication changes reported     No   Fall or balance concerns reported    No   Warm-up and Cool-down Performed as group-led instruction   Resistance Training Performed Yes   VAD Patient? No     Pain Assessment   Currently in Pain? No/denies   Pain Score 0-No pain   Multiple Pain Sites No      Capillary Blood Glucose: No results found for this or any previous visit (from the past 24 hour(s)).   Goals Met:  Independence with exercise equipment Improved SOB with ADL's Using PLB without cueing & demonstrates good technique No report of cardiac concerns or symptoms Strength training completed today  Goals Unmet:  Not Applicable  Comments: Check out 1145   Dr. Sinda Du is Medical Director for [facility].

## 2016-03-17 NOTE — Progress Notes (Signed)
Pulmonary Individual Treatment Plan  Patient Details  Name: Lance [name] MRN: [id number] Date of Birth: 06-22-1945 Referring Provider:   Flowsheet Row PULMONARY REHAB OTHER RESP ORIENTATION from 02/26/2016 in [place]  Referring Provider  Dr. Harl Bowie      Initial Encounter Date:  Flowsheet Row PULMONARY REHAB OTHER RESP ORIENTATION from 02/26/2016 in [place]  Date  02/26/16  Referring Provider  Dr. Harl Bowie      Visit Diagnosis: SOB (shortness of breath)  Patient's Home Medications on Admission:   Current Outpatient Prescriptions:  .  albuterol (PROVENTIL HFA;VENTOLIN HFA) 108 (90 BASE) MCG/ACT inhaler, Inhale 2 puffs into the lungs every 6 (six) hours as needed for wheezing., Disp: 1 Inhaler, Rfl: 2 .  albuterol (PROVENTIL) (2.5 MG/3ML) 0.083% nebulizer solution, Take 3 mLs (2.5 mg total) by nebulization every 6 (six) hours as needed for wheezing or shortness of breath., Disp: 150 mL, Rfl: 0 .  ALPRAZolam (XANAX) 1 MG tablet, TAKE 1/2-1 TABLET 2 TIMES A DAY AS NEEDED, Disp: 60 tablet, Rfl: 3 .  amLODipine (NORVASC) 2.5 MG tablet, Take 1 tablet (2.5 mg total) by mouth daily., Disp: 30 tablet, Rfl: 11 .  aspirin 81 MG tablet, Take 81 mg by mouth daily., Disp: , Rfl:  .  atorvastatin (LIPITOR) 80 MG tablet, Take 80 mg by mouth daily. , Disp: , Rfl:  .  budesonide-formoterol (SYMBICORT) 160-4.5 MCG/ACT inhaler, Inhale 2 puffs into the lungs 2 (two) times daily., Disp: 1 Inhaler, Rfl: 3 .  clopidogrel (PLAVIX) 75 MG tablet, TAKE ONE TABLET EVERY MORNING WITH BREAKFAST, Disp: 30 tablet, Rfl: 5 .  diclofenac sodium (VOLTAREN) 1 % GEL, Apply 2 g topically 4 (four) times daily., Disp: 1 Tube, Rfl: 0 .  dicyclomine (BENTYL) 10 MG capsule, TAKE ONE CAPSULE EVERY MORNING AND ONE CAPSULE AT BEDTIME AS NEEDED FOR INCREASED STOOLS AND BLOATING, Disp: 60 capsule, Rfl: 5 .  donepezil (ARICEPT) 10 MG tablet, Take 1 tablet (10 mg total) by mouth at  bedtime., Disp: 90 tablet, Rfl: 3 .  DULoxetine (CYMBALTA) 60 MG capsule, TAKE ONE (1) CAPSULE EACH DAY, Disp: 30 capsule, Rfl: 5 .  empagliflozin (JARDIANCE) 10 MG TABS tablet, Take 10 mg by mouth daily., Disp: , Rfl:  .  fexofenadine (ALLEGRA) 180 MG tablet, Take 180 mg by mouth daily as needed. , Disp: , Rfl:  .  furosemide (LASIX) 20 MG tablet, Take 1 tablet (20 mg total) by mouth daily., Disp: 90 tablet, Rfl: 3 .  gabapentin (NEURONTIN) 300 MG capsule, TAKE ONE TO TWO CAPSULES BY MOUTH THREE TIMES AS DAY AS NEEDED AS NEEDED, Disp: 180 capsule, Rfl: 6 .  glucose blood (ONE TOUCH ULTRA TEST) test strip, Use as instructed 4 times daily. E11.65, Disp: 150 each, Rfl: 1 .  guaiFENesin (MUCINEX) 600 MG 12 hr tablet, Take 1,200 mg by mouth 2 (two) times daily as needed for to loosen phlegm., Disp: , Rfl:  .  HUMALOG KWIKPEN 100 UNIT/ML KiwkPen, Inject 0.32-0.38 mLs (32-38 Units total) into the skin 3 (three) times daily., Disp: 30 pen, Rfl: 2 .  HYDROcodone-acetaminophen (LORTAB) 7.5-500 MG tablet, Take 1 tablet by mouth every 6 (six) hours as needed for pain., Disp: , Rfl:  .  Insulin Detemir (LEVEMIR FLEXTOUCH) 100 UNIT/ML Pen, Inject 90 Units into the skin daily at 10 pm., Disp: 30 pen, Rfl: 2 .  losartan (COZAAR) 25 MG tablet, Take 1 tablet (25 mg total) by mouth daily. (Patient taking differently: Take 50  mg by mouth daily. ), Disp: 30 tablet, Rfl: 11 .  magnesium oxide (MAG-OX) 400 MG tablet, TAKE ONE TABLET ONCE DAILY, Disp: 30 tablet, Rfl: 11 .  metoprolol tartrate (LOPRESSOR) 25 MG tablet, Take 1 tablet (25 mg total) by mouth 2 (two) times daily. (Patient taking differently: Take 50 mg by mouth 2 (two) times daily. ), Disp: 60 tablet, Rfl: 11 .  nitroGLYCERIN (NITROSTAT) 0.4 MG SL tablet, Place 1 tablet (0.4 mg total) under the tongue every 5 (five) minutes as needed. For chest pain (Patient taking differently: Place 0.4 mg under the tongue every 5 (five) minutes as needed. For chest pain), Disp:  25 tablet, Rfl: 3 .  olopatadine (PATANOL) 0.1 % ophthalmic solution, Place 1 drop into both eyes 2 (two) times daily., Disp: 5 mL, Rfl: 12 .  pantoprazole (PROTONIX) 40 MG tablet, TAKE ONE TABLET TWICE DAILY, Disp: 60 tablet, Rfl: 5 .  Probiotic Product (ALIGN PO), Take 1 tablet by mouth daily. , Disp: , Rfl:  .  sildenafil (REVATIO) 20 MG tablet, Take 3 -5 tabs as needed for erectile dysfunction, Disp: 20 tablet, Rfl: 4 .  traZODone (DESYREL) 100 MG tablet, TAKE ONE-HALF TO ONE TABLET AT BEDTIME AS NEEDED FOR SLEEP, Disp: 30 tablet, Rfl: 5 .  UNIFINE PENTIPS 31G X 6 MM MISC, USE PEN NEEDLES 4 TIMES A DAY, Disp: 150 each, Rfl: 0 .  vitamin B-12 (CYANOCOBALAMIN) 1000 MCG tablet, Take 1,000 mcg by mouth daily. Reported on 08/25/2015, Disp: , Rfl:  .  Vitamin D, Ergocalciferol, (DRISDOL) 50000 units CAPS capsule, TAKE ONE CAPSULE BY MOUTH ONCE A WEEK (Patient taking differently: TAKE ONE CAPSULE BY MOUTH ONCE A MONTH -3RD OF MONTH), Disp: 12 capsule, Rfl: 0  Past Medical History: Past Medical History:  Diagnosis Date  . Allergic rhinitis, cause unspecified   . Anxiety and depression   . ASCVD (arteriosclerotic cardiovascular disease)    70% mid left anterior descending lesion on cath in 06/1995; left anterior desending DES placed in 8/03 and RCA stent in 9/03; captain 3/05 revealed 90% second marginal for which PCI was performed, 70% PDA and a total obstruction of the first diagonal and marginal; sudden cardiac death in [place] in 11/23/2003 for which automatic implantable cardiac defibrillator placed; negativ stress nuclear 10/07  . Atrial fibrillation ([place])   . Benign prostatic hypertrophy   . C. difficile diarrhea   . CAD (coronary artery disease) 1997  . CHF (congestive heart failure) ([place])   . COPD (chronic obstructive pulmonary disease) ([place])   . Coronary artery disease   . CVD (cerebrovascular disease) 05/2008   Transient ischemic attack; carotid ultrasound-plaque without focal disease  .  Degenerative joint disease 2002   C-spine fusion   . Diabetes mellitus    Insulin requirement  . Diabetes mellitus without mention of complication   . Diabetic peripheral neuropathy ([place]) 09/06/2014  . Erectile dysfunction   . Esophageal reflux   . HOH (hard of hearing)   . Hx-TIA (transient ischemic attack) 2010  . Hyperlipidemia   . Hypertension   . ICD (implantable cardiac defibrillator) in place 2005   [facility] ICD, for SCD  . Memory deficits 09/05/2013  . Myocardial infarction   . Other testicular hypofunction   . Pacemaker   . S/P endoscopy Dec 2011   RMR: nl esophagus, hyperplastic polyp, active gastritis, no H.pylori.   . Shortness of breath   . Stroke ([place])   . Tobacco abuse    100 pack/year comsuption; cigarettes  discontinued 2003; all tobacco products in 2008  . Tubular adenoma   . Vitreous floaters of left eye     Tobacco Use: History  Smoking Status  . Former Smoker  . Packs/day: 3.00  . Years: 40.00  . Types: Cigarettes  . Start date: 05/03/1954  . Quit date: 05/03/1998  Smokeless Tobacco  . Current User  . Types: Chew    Labs: Recent Review Flowsheet Data    Labs for ITP Cardiac and Pulmonary Rehab Latest Ref Rng & Units 07/25/2013 11/06/2014 02/06/2015 05/15/2015 11/21/2015   Cholestrol 125 - 200 mg/dL 154 - - - 125   LDLCALC <130 mg/dL 92 - - - 55   HDL >=40 mg/dL 31(L) - - - 34(L)   Trlycerides <150 mg/dL 155(H) - - - 178(H)   Hemoglobin A1c <5.7 % - 10.2(A) 9.4(A) 9.9(H) 9.0(H)   PHART 7.350 - 7.450 - - - - -   PCO2ART 35.0 - 45.0 mmHg - - - - -   HCO3 20.0 - 24.0 mEq/L - - - - -   TCO2 0 - 100 mmol/L - - - - -   ACIDBASEDEF 0.0 - 2.0 mmol/L - - - - -   O2SAT % - - - - -      Capillary Blood Glucose: Lab Results  Component Value Date   GLUCAP 166 (H) 07/09/2015   GLUCAP 287 (H) 07/08/2015   GLUCAP 222 (H) 07/08/2015   GLUCAP 118 (H) 07/08/2015   GLUCAP 149 (H) 07/08/2015     ADL UCSD:     Pulmonary Assessment Scores    Row Name 02/26/16  1340         ADL UCSD   ADL Phase Entry     SOB Score total 114     Rest 2     Walk 6     Stairs 4     Bath 2     Dress 2     Shop 3       CAT Score   CAT Score 26       mMRC Score   mMRC Score 4        Pulmonary Function Assessment:     Pulmonary Function Assessment - 02/26/16 1337      Pulmonary Function Tests   FVC% 87 %   FEV1% 88 %   FEV1/FVC Ratio 101   RV% 111 %   DLCO% 92 %     Initial Spirometry Results   FVC% 87 %   FEV1% 88 %   FEV1/FVC Ratio 101     Post Bronchodilator Spirometry Results   FVC% 88 %   FEV1% 94 %   FEV1/FVC Ratio 106     Breath   Shortness of Breath Yes;Limiting activity      Exercise Target Goals:    Exercise Program Goal: Individual exercise prescription set with THRR, safety & activity barriers. Participant demonstrates ability to understand and report RPE using BORG scale, to self-measure pulse accurately, and to acknowledge the importance of the exercise prescription.  Exercise Prescription Goal: Starting with aerobic activity 30 plus minutes a day, 3 days per week for initial exercise prescription. Provide home exercise prescription and guidelines that participant acknowledges understanding prior to discharge.  Activity Barriers & Risk Stratification:     Activity Barriers & Cardiac Risk Stratification - 02/26/16 1141      Activity Barriers & Cardiac Risk Stratification   Activity Barriers Back Problems;Left Knee Replacement;Joint Problems   Cardiac Risk  Stratification High      6 Minute Walk:     6 Minute Walk    Row Name 02/26/16 1134         6 Minute Walk   Phase Initial     Distance 950 feet     Distance % Change 0 %     Walk Time 6 minutes     # of Rest Breaks 0     MPH 1.79     METS 2.37     RPE 15     Perceived Dyspnea  14     VO2 Peak 6.7     Symptoms No     Resting HR 69 bpm     Resting BP 130/72     Max Ex. HR 82 bpm     Max Ex. BP 150/76     2 Minute Post BP 138/74         Initial Exercise Prescription:     Initial Exercise Prescription - 02/26/16 1000      Date of Initial Exercise RX and Referring Provider   Date 02/26/16   Referring Provider Dr. Harl Bowie     Treadmill   MPH 1   Grade 0   Minutes 15   METs 1.6     Arm Ergometer   Level 2   Watts 15   Minutes 20   METs 1.9     Prescription Details   Frequency (times per week) 3   Duration Progress to 30 minutes of continuous aerobic without signs/symptoms of physical distress     Intensity   THRR REST +  20   THRR 40-80% of Max Heartrate [id number]   Ratings of Perceived Exertion 11-13   Perceived Dyspnea 0-4     Progression   Progression Continue progressive overload as per policy without signs/symptoms or physical distress.     Resistance Training   Training Prescription Yes   Weight 1   Reps 10-12      Perform Capillary Blood Glucose checks as needed.  Exercise Prescription Changes:      Exercise Prescription Changes    Row Name 03/09/16 1200             Exercise Review   Progression Yes         Response to Exercise   Blood Pressure (Admit) 128/62       Blood Pressure (Exercise) 130/68       Blood Pressure (Exit) 146/70       Heart Rate (Admit) 70 bpm       Heart Rate (Exercise) 87 bpm       Heart Rate (Exit) 74 bpm       Oxygen Saturation (Admit) 96 %       Oxygen Saturation (Exercise) 96 %       Oxygen Saturation (Exit) 94 %       Rating of Perceived Exertion (Exercise) 9       Perceived Dyspnea (Exercise) 9       Duration Progress to 30 minutes of continuous aerobic without signs/symptoms of physical distress       Intensity Rest + 20         Progression   Progression Continue progressive overload as per policy without signs/symptoms or physical distress.         Resistance Training   Training Prescription Yes       Weight 3       Reps 10-12  Treadmill   MPH 1.6       Grade 0       Minutes 15       METs 2.2         Arm Ergometer    Level 2.6       Watts 8       Minutes 20       METs 2.6         Home Exercise Plan   Plans to continue exercise at Home       Frequency Add 2 additional days to program exercise sessions.          Exercise Comments:      Exercise Comments    Row Name 03/09/16 1223           Exercise Comments Patient is proggressing well.          Discharge Exercise Prescription (Final Exercise Prescription Changes):     Exercise Prescription Changes - 03/09/16 1200      Exercise Review   Progression Yes     Response to Exercise   Blood Pressure (Admit) 128/62   Blood Pressure (Exercise) 130/68   Blood Pressure (Exit) 146/70   Heart Rate (Admit) 70 bpm   Heart Rate (Exercise) 87 bpm   Heart Rate (Exit) 74 bpm   Oxygen Saturation (Admit) 96 %   Oxygen Saturation (Exercise) 96 %   Oxygen Saturation (Exit) 94 %   Rating of Perceived Exertion (Exercise) 9   Perceived Dyspnea (Exercise) 9   Duration Progress to 30 minutes of continuous aerobic without signs/symptoms of physical distress   Intensity Rest + 20     Progression   Progression Continue progressive overload as per policy without signs/symptoms or physical distress.     Resistance Training   Training Prescription Yes   Weight 3   Reps 10-12     Treadmill   MPH 1.6   Grade 0   Minutes 15   METs 2.2     Arm Ergometer   Level 2.6   Watts 8   Minutes 20   METs 2.6     Home Exercise Plan   Plans to continue exercise at Home   Frequency Add 2 additional days to program exercise sessions.      Nutrition:  Target Goals: Understanding of nutrition guidelines, daily intake of sodium 1500mg , cholesterol 200mg , calories 30% from fat and 7% or less from saturated fats, daily to have 5 or more servings of fruits and vegetables.  Biometrics:     Pre Biometrics - 02/26/16 1037      Pre Biometrics   Height 5\' 9"  (1.753 m)   Weight 236 lb 12.4 oz (107.4 kg)   Waist Circumference 44.5 inches   Hip Circumference  42.5 inches   Waist to Hip Ratio 1.05 %   BMI (Calculated) 35   Triceps Skinfold 6 mm   % Body Fat 28.6 %   Grip Strength 81.33 kg   Flexibility 0 in   Single Leg Stand 3 seconds       Nutrition Therapy Plan and Nutrition Goals:   Nutrition Discharge: Rate Your Plate Scores:     Nutrition Assessments - 02/26/16 1344      Rate Your Plate Scores   Pre Score 17      Psychosocial: Target Goals: Acknowledge presence or absence of depression, maximize coping skills, provide positive support system. Participant is able to verbalize types and ability to use techniques and skills needed  for reducing stress and depression.  Initial Review & Psychosocial Screening:     Initial Psych Review & Screening - 02/26/16 1402      Initial Review   Current issues with Current Anxiety/Panic  Main concern is being active again w/o SOB.     Family Dynamics   Good Support System? Yes     Barriers   Psychosocial barriers to participate in program Psychosocial barriers identified (see note);The patient should benefit from training in stress management and relaxation.  Though the patients QOL was 23.09, his PHQ-9 was 9. He said he feels depressed and hopeless. He siad it is more based in the fact he can't do anything without getting breathless.       Screening Interventions   Interventions Encouraged to exercise      Quality of Life Scores:     Quality of Life - 02/26/16 1038      Quality of Life Scores   Health/Function Pre 19.5 %   Socioeconomic Pre 24.86 %   Psych/Spiritual Pre 29.14 %   Family Pre 24 %   GLOBAL Pre 23.09 %      PHQ-9: Recent Review Flowsheet Data    Depression screen [facility] 2/9 02/26/2016 11/25/2015 08/25/2015 07/04/2015 06/04/2015   Decreased Interest 3 0 0 0 0   Down, Depressed, Hopeless 3 0 0 0 0   PHQ - 2 Score 6 0 0 0 0   Altered sleeping 0 - - - -   Tired, decreased energy 3 - - - -   Change in appetite 0 - - - -   Feeling bad or failure about yourself  0  - - - -   Trouble concentrating 0 - - - -   Moving slowly or fidgety/restless 0 - - - -   PHQ-9 Score 9 - - - -   Difficult doing work/chores Not difficult at all - - - -      Psychosocial Evaluation and Intervention:     Psychosocial Evaluation - 02/26/16 1406      Psychosocial Evaluation & Interventions   Interventions Stress management education;Relaxation education;Encouraged to exercise with the program and follow exercise prescription   Continued Psychosocial Services Needed Yes  The patient does not feel he needs counseling. From my observation he would benefit from consulting with a counselor.       Psychosocial Re-Evaluation:     Psychosocial Re-Evaluation    [employer] Name 03/17/16 1441             Psychosocial Re-Evaluation   Interventions Encouraged to attend Pulmonary Rehabilitation for the exercise       Comments Patient's QOL score was 23.09. He says he is not depressed and does not need counseling.        Continued Psychosocial Services Needed No          Education: Education Goals: Education classes will be provided on a weekly basis, covering required topics. Participant will state understanding/return demonstration of topics presented.  Learning Barriers/Preferences:     Learning Barriers/Preferences - 02/26/16 1142      Learning Barriers/Preferences   Learning Barriers None   Learning Preferences Individual Instruction;Skilled Demonstration;Group Instruction      Education Topics: How Lungs Work and Diseases: - Discuss the anatomy of the lungs and diseases that can affect the lungs, such as COPD. Flowsheet Row PULMONARY REHAB OTHER RESPIRATORY from 03/11/2016 in [place]  Date  03/04/16  Educator  CG  Instruction Review Code  2- meets goals/outcomes      Exercise: -Discuss the importance of exercise, FITT principles of exercise, normal and abnormal responses to exercise, and how to exercise safely. Flowsheet Row  PULMONARY REHAB OTHER RESPIRATORY from 03/11/2016 in [place]  Date  03/11/16  Educator  [place]  Instruction Review Code  2- meets goals/outcomes      Environmental Irritants: -Discuss types of environmental irritants and how to limit exposure to environmental irritants.   Meds/Inhalers and oxygen: - Discuss respiratory medications, definition of an inhaler and oxygen, and the proper way to use an inhaler and oxygen.   Energy Saving Techniques: - Discuss methods to conserve energy and decrease shortness of breath when performing activities of daily living.    Bronchial Hygiene / Breathing Techniques: - Discuss breathing mechanics, pursed-lip breathing technique,  proper posture, effective ways to clear airways, and other functional breathing techniques   Cleaning Equipment: - Provides group verbal and written instruction about the health risks of elevated stress, cause of high stress, and healthy ways to reduce stress.   Nutrition I: Fats: - Discuss the types of cholesterol, what cholesterol does to the body, and how cholesterol levels can be controlled.   Nutrition II: Labels: -Discuss the different components of food labels and how to read food labels.   Respiratory Infections: - Discuss the signs and symptoms of respiratory infections, ways to prevent respiratory infections, and the importance of seeking medical treatment when having a respiratory infection.   Stress I: Signs and Symptoms: - Discuss the causes of stress, how stress may lead to anxiety and depression, and ways to limit stress.   Stress II: Relaxation: -Discuss relaxation techniques to limit stress.   Oxygen for Home/Travel: - Discuss how to prepare for travel when on oxygen and proper ways to transport and store oxygen to ensure safety.   Knowledge Questionnaire Score:     Knowledge Questionnaire Score - 02/26/16 1335      Knowledge Questionnaire Score   Pre Score 10/14       Core Components/Risk Factors/Patient Goals at Admission:     Personal Goals and Risk Factors at Admission - 02/26/16 1344      Core Components/Risk Factors/Patient Goals on Admission    Weight Management Yes   Intervention Weight Management/Obesity: Establish reasonable short term and long term weight goals.   Admit Weight 237 lb (107.5 kg)   Goal Weight: Short Term 227 lb (103 kg)   Goal Weight: Long Term 217 lb (98.4 kg)   Expected Outcomes Short Term: Continue to assess and modify interventions until short term weight is achieved;Long Term: Adherence to nutrition and physical activity/exercise program aimed toward attainment of established weight goal   Sedentary Yes   Intervention Provide advice, education, support and counseling about physical activity/exercise needs.;Develop an individualized exercise prescription for aerobic and resistive training based on initial evaluation findings, risk stratification, comorbidities and participant's personal goals.   Expected Outcomes Achievement of increased cardiorespiratory fitness and enhanced flexibility, muscular endurance and strength shown through measurements of functional capacity and personal statement of participant.   Increase Strength and Stamina Yes   Intervention Provide advice, education, support and counseling about physical activity/exercise needs.;Develop an individualized exercise prescription for aerobic and resistive training based on initial evaluation findings, risk stratification, comorbidities and participant's personal goals.   Expected Outcomes Achievement of increased cardiorespiratory fitness and enhanced flexibility, muscular endurance and strength shown through measurements of functional capacity and personal statement of participant.   Improve shortness  of breath with ADL's Yes   Intervention Provide education, individualized exercise plan and daily activity instruction to help decrease symptoms of SOB with  activities of daily living.   Expected Outcomes Short Term: Achieves a reduction of symptoms when performing activities of daily living.   Develop more efficient breathing techniques such as purse lipped breathing and diaphragmatic breathing; and practicing self-pacing with activity Yes   Intervention Provide education, demonstration and support about specific breathing techniuqes utilized for more efficient breathing. Include techniques such as pursed lipped breathing, diaphragmatic breathing and self-pacing activity.   Expected Outcomes Short Term: Participant will be able to demonstrate and use breathing techniques as needed throughout daily activities.   Diabetes Yes   Intervention Provide education about signs/symptoms and action to take for hypo/hyperglycemia.   Expected Outcomes Long Term: Attainment of HbA1C < 7%.;Short Term: Participant verbalizes understanding of the signs/symptoms and immediate care of hyper/hypoglycemia, proper foot care and importance of medication, aerobic/resistive exercise and nutrition plan for blood glucose control.   Stress Yes   Intervention Offer individual and/or small group education and counseling on adjustment to heart disease, stress management and health-related lifestyle change. Teach and support self-help strategies.   Expected Outcomes Short Term: Participant demonstrates changes in health-related behavior, relaxation and other stress management skills, ability to obtain effective social support, and compliance with psychotropic medications if prescribed.;Long Term: Emotional wellbeing is indicated by absence of clinically significant psychosocial distress or social isolation.   Personal Goal Other Yes   Personal Goal Lose weight 10-20lbs before graduation. then continue to lose up to 50lbs. Breath better.   Intervention Attend Pulmonary Rehab 2 x week and then supplement exercise at home with 3 x week.    Expected Outcomes Reach personal goals.        Core Components/Risk Factors/Patient Goals Review:      Goals and Risk Factor Review    Row Name 02/26/16 1402 03/17/16 1440           Core Components/Risk Factors/Patient Goals Review   Personal Goals Review Weight Management/Obesity;Improve shortness of breath with ADL's;Develop more efficient breathing techniques such as purse lipped breathing and diaphragmatic breathing and practicing self-pacing with activity.;Diabetes;Stress Weight Management/Obesity;Increase Strength and Stamina;Improve shortness of breath with ADL's;Develop more efficient breathing techniques such as purse lipped breathing and diaphragmatic breathing and practicing self-pacing with activity.;Diabetes;Stress      Review  - Patient new to program. He has attended 6 sessions. Will continue to monitor for progress.       Expected Outcomes  - Patient will complete the program meeting his personal goals.          Core Components/Risk Factors/Patient Goals at Discharge (Final Review):      Goals and Risk Factor Review - 03/17/16 1440      Core Components/Risk Factors/Patient Goals Review   Personal Goals Review Weight Management/Obesity;Increase Strength and Stamina;Improve shortness of breath with ADL's;Develop more efficient breathing techniques such as purse lipped breathing and diaphragmatic breathing and practicing self-pacing with activity.;Diabetes;Stress   Review Patient new to program. He has attended 6 sessions. Will continue to monitor for progress.    Expected Outcomes Patient will complete the program meeting his personal goals.       ITP Comments:   Comments: ITP 30 Day REVIEW Pt is making expected progress toward pulmonary rehab goals after completing 6 sessions. Recommend continued exercise, life style modification, education, and utilization of breathing techniques to increase stamina and strength and decrease shortness of breath with  exertion.

## 2016-03-18 ENCOUNTER — Encounter (HOSPITAL_COMMUNITY)
Admission: RE | Admit: 2016-03-18 | Discharge: 2016-03-18 | Disposition: A | Discharge status: Home / Self Care | Payer: Medicare Other | Source: Ambulatory Visit | Attending: Cardiology | Admitting: Cardiology

## 2016-03-18 DIAGNOSIS — R0602 Shortness of breath: Secondary | ICD-10-CM

## 2016-03-18 DIAGNOSIS — J45909 Unspecified asthma, uncomplicated: Secondary | ICD-10-CM | POA: Diagnosis not present

## 2016-03-18 NOTE — Progress Notes (Signed)
Daily Session Note  Patient Details  Name: Lance [name] MRN: [id number] Date of Birth: Oct 08, 1945 Referring Provider:   Flowsheet Row PULMONARY REHAB OTHER RESP ORIENTATION from 02/26/2016 in [place]  Referring Provider  Dr. Harl Bowie      Encounter Date: 03/18/2016  Check In:     Session Check In - 03/18/16 1044      Check-In   Location [facility]   Staff Present Suzanne Boron, BS, EP, Exercise Physiologist   Supervising physician immediately available to respond to emergencies See telemetry face sheet for immediately available MD   Medication changes reported     No   Fall or balance concerns reported    No   Warm-up and Cool-down Performed as group-led instruction   Resistance Training Performed Yes   VAD Patient? No     Pain Assessment   Currently in Pain? No/denies   Pain Score 0-No pain   Multiple Pain Sites No      Capillary Blood Glucose: No results found for this or any previous visit (from the past 24 hour(s)).   Goals Met:  Independence with exercise equipment Improved SOB with ADL's Using PLB without cueing & demonstrates good technique Exercise tolerated well Strength training completed today  Goals Unmet:  Not Applicable  Comments: Check out 1145   Dr. Sinda Du is Medical Director for [facility] Pulmonary Re[facility].

## 2016-03-22 ENCOUNTER — Ambulatory Visit (INDEPENDENT_AMBULATORY_CARE_PROVIDER_SITE_OTHER): Payer: Medicare Other | Admitting: Family Medicine

## 2016-03-22 ENCOUNTER — Encounter: Payer: Self-pay | Admitting: Family Medicine

## 2016-03-22 VITALS — BP 136/84 | Temp 98.4°F | Ht 69.0 in | Wt 239.8 lb

## 2016-03-22 DIAGNOSIS — J019 Acute sinusitis, unspecified: Secondary | ICD-10-CM

## 2016-03-22 DIAGNOSIS — B9689 Other specified bacterial agents as the cause of diseases classified elsewhere: Secondary | ICD-10-CM | POA: Diagnosis not present

## 2016-03-22 DIAGNOSIS — I255 Ischemic cardiomyopathy: Secondary | ICD-10-CM | POA: Diagnosis not present

## 2016-03-22 MED ORDER — DOXYCYCLINE HYCLATE 100 MG PO CAPS: 100.0000 mg | ORAL_CAPSULE | Freq: Two times a day (BID) | ORAL | 0 refills | Status: DC

## 2016-03-22 NOTE — Progress Notes (Signed)
   Subjective:    Patient ID: Lance [name], male    DOB: [birth year], 70 y.o.   MRN: [id number]  Cough  This is a new problem. The current episode started in the past 7 days. Associated symptoms include nasal congestion and rhinorrhea. Pertinent negatives include no chest pain, ear pain, fever or wheezing.   Head congestion drainage coughing sinus pressure several days Patient relates a lot of head congestion drainage coughing sinus pressure not feeling good denies high fever chills sweats wheezing or difficulty breathing  Review of Systems  Constitutional: Negative for activity change and fever.  HENT: Positive for congestion and rhinorrhea. Negative for ear pain.   Eyes: Negative for discharge.  Respiratory: Positive for cough. Negative for wheezing.   Cardiovascular: Negative for chest pain.       Objective:   Physical Exam  Constitutional: He appears well-developed.  HENT:  Head: Normocephalic.  Mouth/Throat: Oropharynx is clear and moist. No oropharyngeal exudate.  Neck: Normal range of motion.  Cardiovascular: Normal rate, regular rhythm and normal heart sounds.   No murmur heard. Pulmonary/Chest: Effort normal and breath sounds normal. He has no wheezes.  Lymphadenopathy:    He has no cervical adenopathy.  Neurological: He exhibits normal muscle tone.  Skin: Skin is warm and dry.  Nursing note and vitals reviewed.  PMH diabetes       Assessment & Plan:  Viral syndrome Secondary rhinosinusitis Antibiotics prescribed warning signs discussed

## 2016-03-23 ENCOUNTER — Encounter (HOSPITAL_COMMUNITY): Payer: Medicare Other

## 2016-03-25 ENCOUNTER — Encounter (HOSPITAL_COMMUNITY): Payer: Medicare Other

## 2016-03-30 ENCOUNTER — Encounter (HOSPITAL_COMMUNITY)
Admission: RE | Admit: 2016-03-30 | Discharge: 2016-03-30 | Disposition: A | Discharge status: Home / Self Care | Payer: Medicare Other | Source: Ambulatory Visit | Attending: Cardiology | Admitting: Cardiology

## 2016-03-30 DIAGNOSIS — R0602 Shortness of breath: Secondary | ICD-10-CM | POA: Diagnosis not present

## 2016-03-30 DIAGNOSIS — J45909 Unspecified asthma, uncomplicated: Secondary | ICD-10-CM | POA: Diagnosis not present

## 2016-03-30 NOTE — Progress Notes (Signed)
Daily Session Note  Patient Details  Name: Lance [name] MRN: [id number] Date of Birth: [birth year] Referring Provider:   Flowsheet Row PULMONARY REHAB OTHER RESP ORIENTATION from 02/26/2016 in [place]  Referring Provider  Dr. Harl Bowie      Encounter Date: 03/30/2016  Check In:     Session Check In - 03/30/16 1045      Check-In   Location [facility]   Staff Present Suzanne Boron, BS, EP, Exercise Physiologist   Supervising physician immediately available to respond to emergencies See telemetry face sheet for immediately available MD   Medication changes reported     No   Fall or balance concerns reported    No   Warm-up and Cool-down Performed as group-led instruction   Resistance Training Performed Yes   VAD Patient? No     Pain Assessment   Currently in Pain? No/denies   Pain Score 0-No pain   Multiple Pain Sites No      Capillary Blood Glucose: No results found for this or any previous visit (from the past 24 hour(s)).   Goals Met:  Independence with exercise equipment Improved SOB with ADL's Using PLB without cueing & demonstrates good technique Exercise tolerated well No report of cardiac concerns or symptoms Strength training completed today  Goals Unmet:  Not Applicable  Comments: Check out 1145   Dr. Sinda Du is Medical Director for [facility] Pulmonary Rehab.

## 2016-03-31 ENCOUNTER — Telehealth: Payer: Self-pay

## 2016-04-01 ENCOUNTER — Encounter (HOSPITAL_COMMUNITY)
Admission: RE | Admit: 2016-04-01 | Discharge: 2016-04-01 | Disposition: A | Discharge status: Home / Self Care | Payer: Medicare Other | Source: Ambulatory Visit | Attending: Cardiology | Admitting: Cardiology

## 2016-04-01 DIAGNOSIS — R0602 Shortness of breath: Secondary | ICD-10-CM

## 2016-04-01 DIAGNOSIS — J45909 Unspecified asthma, uncomplicated: Secondary | ICD-10-CM | POA: Diagnosis not present

## 2016-04-01 NOTE — Progress Notes (Signed)
Daily Session Note  Patient Details  Name: Lance [name] MRN: [id number] Date of Birth: Sep 24, 1945 Referring Provider:   Flowsheet Row PULMONARY REHAB OTHER RESP ORIENTATION from 02/26/2016 in [place]  Referring Provider  Dr. Harl Bowie      Encounter Date: 04/01/2016  Check In:     Session Check In - 04/01/16 1045      Check-In   Location [facility]   Staff Present Suzanne Boron, BS, EP, Exercise Physiologist   Supervising physician immediately available to respond to emergencies See telemetry face sheet for immediately available MD   Medication changes reported     No   Fall or balance concerns reported    No   Warm-up and Cool-down Performed as group-led instruction   Resistance Training Performed Yes   VAD Patient? No     Pain Assessment   Currently in Pain? No/denies   Pain Score 0-No pain   Multiple Pain Sites No      Capillary Blood Glucose: No results found for this or any previous visit (from the past 24 hour(s)).   Goals Met:  Independence with exercise equipment Improved SOB with ADL's Using PLB without cueing & demonstrates good technique Exercise tolerated well No report of cardiac concerns or symptoms Strength training completed today  Goals Unmet:  Not Applicable  Comments: Check out 1145   Dr. Sinda Du is Medical Director for [facility] Pulmonary Rehab.

## 2016-04-02 DIAGNOSIS — B351 Tinea unguium: Secondary | ICD-10-CM | POA: Diagnosis not present

## 2016-04-02 DIAGNOSIS — E1142 Type 2 diabetes mellitus with diabetic polyneuropathy: Secondary | ICD-10-CM | POA: Diagnosis not present

## 2016-04-06 ENCOUNTER — Encounter (HOSPITAL_COMMUNITY)
Admission: RE | Admit: 2016-04-06 | Discharge: 2016-04-06 | Disposition: A | Discharge status: Home / Self Care | Payer: Medicare Other | Source: Ambulatory Visit | Attending: Cardiology | Admitting: Cardiology

## 2016-04-06 ENCOUNTER — Other Ambulatory Visit: Payer: Self-pay

## 2016-04-06 DIAGNOSIS — J45909 Unspecified asthma, uncomplicated: Secondary | ICD-10-CM | POA: Insufficient documentation

## 2016-04-06 DIAGNOSIS — R0602 Shortness of breath: Secondary | ICD-10-CM | POA: Insufficient documentation

## 2016-04-06 NOTE — Telephone Encounter (Signed)
Opened in error

## 2016-04-06 NOTE — Progress Notes (Signed)
Daily Session Note  Patient Details  Name: Lance [name] MRN: [id number] Date of Birth: 1945/12/08 Referring Provider:   Flowsheet Row PULMONARY REHAB OTHER RESP ORIENTATION from 02/26/2016 in [place]  Referring Provider  Dr. Harl Bowie      Encounter Date: 04/06/2016  Check In:     Session Check In - 04/06/16 1043      Check-In   Location [facility]   Staff Present Suzanne Boron, BS, EP, Exercise Physiologist   Supervising physician immediately available to respond to emergencies See telemetry face sheet for immediately available MD   Medication changes reported     No   Fall or balance concerns reported    No   Warm-up and Cool-down Performed as group-led instruction   Resistance Training Performed Yes   VAD Patient? No     Pain Assessment   Currently in Pain? No/denies   Pain Score 0-No pain   Multiple Pain Sites No      Capillary Blood Glucose: No results found for this or any previous visit (from the past 24 hour(s)).   Goals Met:  Independence with exercise equipment Improved SOB with ADL's Using PLB without cueing & demonstrates good technique Exercise tolerated well No report of cardiac concerns or symptoms Strength training completed today  Goals Unmet:  Not Applicable  Comments: Check out 1145   Dr. Sinda Du is Medical Director for [facility] Pulmonary Rehab.

## 2016-04-07 ENCOUNTER — Telehealth: Payer: Self-pay | Admitting: Family Medicine

## 2016-04-07 ENCOUNTER — Other Ambulatory Visit: Payer: Self-pay | Admitting: Family Medicine

## 2016-04-07 MED ORDER — SULFAMETHOXAZOLE-TRIMETHOPRIM 800-160 MG PO TABS: 1.0000 | ORAL_TABLET | Freq: Two times a day (BID) | ORAL | 0 refills | Status: DC

## 2016-04-07 NOTE — Telephone Encounter (Signed)
Patient seen on 03/22/16 for acute bacterial rhino sinusitis by Dr. Nicki Reaper.  He was told to call back today if not any better and we would call something else in.  He is still having sinus issues and stopped up in his chest.  Please advise.  [place]

## 2016-04-07 NOTE — Telephone Encounter (Signed)
Notified patient recommend Bactrim DS 1 twice a day 10 days. Continue albuterol and Symbicort for COPD issues if significant shortness of breath or worsening issues recommend being seen. Patient verbalized understanding. Med sent to pharmacy.

## 2016-04-07 NOTE — Telephone Encounter (Signed)
I recommend Bactrim DS 1 twice a day 10 days. Continue albuterol and Symbicort for COPD issues if significant shortness of breath or worsening issues recommend being seen

## 2016-04-07 NOTE — Telephone Encounter (Signed)
Patient is having sinus drainage, congestion, wheezing, shortness of breath and coughing. No fever noted. Patient states not to be alarmed about the wheezing and shortness of breath because this is a chronic issue that he deals with all the time. Please advise.

## 2016-04-08 ENCOUNTER — Encounter (HOSPITAL_COMMUNITY)
Admission: RE | Admit: 2016-04-08 | Discharge: 2016-04-08 | Disposition: A | Discharge status: Home / Self Care | Payer: Medicare Other | Source: Ambulatory Visit | Attending: Cardiology | Admitting: Cardiology

## 2016-04-08 DIAGNOSIS — R0602 Shortness of breath: Secondary | ICD-10-CM

## 2016-04-08 DIAGNOSIS — J45909 Unspecified asthma, uncomplicated: Secondary | ICD-10-CM | POA: Diagnosis not present

## 2016-04-08 MED ORDER — PANTOPRAZOLE SODIUM 40 MG PO TBEC: 40.0000 mg | DELAYED_RELEASE_TABLET | Freq: Two times a day (BID) | ORAL | 3 refills | Status: DC

## 2016-04-08 NOTE — Progress Notes (Signed)
Daily Session Note  Patient Details  Name: MATHIUS BIRKELAND MRN: [id number] Date of Birth: [birth year] Referring Provider:   Flowsheet Row PULMONARY REHAB OTHER RESP ORIENTATION from 02/26/2016 in [place]  Referring Provider  Dr. Harl Bowie      Encounter Date: 04/08/2016  Check In:     Session Check In - 04/08/16 1045      Check-In   Location [facility]   Staff Present Aundra Dubin, RN, BSN;Clementine Soulliere Luther Parody, BS, EP, Exercise Physiologist   Supervising physician immediately available to respond to emergencies See telemetry face sheet for immediately available MD   Medication changes reported     No   Fall or balance concerns reported    No   Warm-up and Cool-down Performed as group-led instruction   Resistance Training Performed Yes   VAD Patient? No     Pain Assessment   Currently in Pain? No/denies   Pain Score 0-No pain   Multiple Pain Sites No      Capillary Blood Glucose: No results found for this or any previous visit (from the past 24 hour(s)).   Goals Met:  Independence with exercise equipment Improved SOB with ADL's Using PLB without cueing & demonstrates good technique Exercise tolerated well No report of cardiac concerns or symptoms Strength training completed today  Goals Unmet:  Not Applicable  Comments: Check out 1145   Dr. Sinda Du is Medical Director for [facility] Pulmonary Rehab.

## 2016-04-12 ENCOUNTER — Telehealth: Payer: Self-pay

## 2016-04-12 ENCOUNTER — Ambulatory Visit (INDEPENDENT_AMBULATORY_CARE_PROVIDER_SITE_OTHER): Payer: Medicare Other | Admitting: Gastroenterology

## 2016-04-12 ENCOUNTER — Other Ambulatory Visit: Payer: Self-pay

## 2016-04-12 ENCOUNTER — Encounter: Payer: Self-pay | Admitting: Gastroenterology

## 2016-04-12 VITALS — BP 122/69 | HR 79 | Temp 97.8°F | Ht 69.0 in | Wt 235.6 lb

## 2016-04-12 DIAGNOSIS — Z86010 Personal history of colonic polyps: Secondary | ICD-10-CM

## 2016-04-12 DIAGNOSIS — I255 Ischemic cardiomyopathy: Secondary | ICD-10-CM | POA: Diagnosis not present

## 2016-04-12 DIAGNOSIS — R1310 Dysphagia, unspecified: Secondary | ICD-10-CM

## 2016-04-12 DIAGNOSIS — R1319 Other dysphagia: Secondary | ICD-10-CM

## 2016-04-12 MED ORDER — DICYCLOMINE HCL 10 MG PO CAPS: ORAL_CAPSULE | ORAL | 5 refills | Status: DC

## 2016-04-12 MED ORDER — PANTOPRAZOLE SODIUM 40 MG PO TBEC: 40.0000 mg | DELAYED_RELEASE_TABLET | Freq: Two times a day (BID) | ORAL | 5 refills | Status: DC

## 2016-04-12 MED ORDER — PEG 3350-KCL-NA BICARB-NACL 420 G PO SOLR
4000.0000 mL | ORAL | 0 refills | Status: DC
Start: 2016-04-12 — End: 2016-08-05
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
## 2016-04-12 NOTE — Telephone Encounter (Signed)
Called and informed pt's wife of pre-op appt 05/05/16 at 2:45 pm. Letter mailed.

## 2016-04-12 NOTE — Patient Instructions (Signed)
We have scheduled you for a colonoscopy, upper endoscopy, and dilation with Dr. Gala Romney in the near future.  I have refilled Protonix to take twice a day and Bentyl to take twice a day as needed.

## 2016-04-13 ENCOUNTER — Encounter (HOSPITAL_COMMUNITY)
Admission: RE | Admit: 2016-04-13 | Discharge: 2016-04-13 | Disposition: A | Discharge status: Home / Self Care | Payer: Medicare Other | Source: Ambulatory Visit | Attending: Cardiology | Admitting: Cardiology

## 2016-04-13 DIAGNOSIS — R0602 Shortness of breath: Secondary | ICD-10-CM | POA: Diagnosis not present

## 2016-04-13 DIAGNOSIS — J45909 Unspecified asthma, uncomplicated: Secondary | ICD-10-CM | POA: Diagnosis not present

## 2016-04-13 NOTE — Progress Notes (Signed)
Daily Session Note  Patient Details  Name: Lance [name] MRN: [id number] Date of Birth: 01/08/1946 Referring Provider:   Flowsheet Row PULMONARY REHAB OTHER RESP ORIENTATION from 02/26/2016 in [place]  Referring Provider  Dr. Harl Bowie      Encounter Date: 04/13/2016  Check In:     Session Check In - 04/13/16 1045      Check-In   Location [facility]   Staff Present Suzanne Boron, BS, EP, Exercise Physiologist   Supervising physician immediately available to respond to emergencies See telemetry face sheet for immediately available MD   Medication changes reported     No   Fall or balance concerns reported    No   Warm-up and Cool-down Performed as group-led instruction   Resistance Training Performed Yes   VAD Patient? No     Pain Assessment   Currently in Pain? No/denies   Pain Score 0-No pain   Multiple Pain Sites No      Capillary Blood Glucose: No results found for this or any previous visit (from the past 24 hour(s)).   Goals Met:  Independence with exercise equipment Improved SOB with ADL's Using PLB without cueing & demonstrates good technique Exercise tolerated well No report of cardiac concerns or symptoms Strength training completed today  Goals Unmet:  Not Applicable  Comments: Check out 1145   Dr. Sinda Du is Medical Director for [facility] Pulmonary Rehab.

## 2016-04-14 ENCOUNTER — Telehealth: Payer: Self-pay | Admitting: Gastroenterology

## 2016-04-14 NOTE — Assessment & Plan Note (Signed)
70 year old pleasant male with history of long-standing GERD, now with recurrent solid food dysphagia. Last EGD in 2014 with empiric dilation. Continues to take Protonix BID but recently ran out. I have refilled this for him today. As of note, he is being treated for upper respiratory symptoms but is improved at time of visit without any distress. He should be appropriate for a procedure after completion of antibiotics. He is to call with any changes or worsening of symptoms, and this will be postponed.   Proceed with upper endoscopy/dilation in the near future with Dr. Gala Romney. The risks, benefits, and alternatives have been discussed in detail with patient. They have stated understanding and desire to proceed.  PROPOFOL due to polypharmacy As of note: he is on Plavix

## 2016-04-14 NOTE — Assessment & Plan Note (Signed)
Due for surveillance colonoscopy now due to history of adenomas. Bentyl does well for likely IBS-D. No alarm signs/symptoms.  Proceed with TCS with Dr. Gala Romney in near future: the risks, benefits, and alternatives have been discussed with the patient in detail. The patient states understanding and desires to proceed. Continue Bentyl BID.

## 2016-04-14 NOTE — Telephone Encounter (Signed)
I didn't mention at his visit yesterday:   For diabetes medication before procedure: 1/2 dose of Levemir the night before, no diabetes medication the day of.

## 2016-04-14 NOTE — Progress Notes (Signed)
CC'ED TO PCP 

## 2016-04-14 NOTE — Telephone Encounter (Signed)
It is on pt's instructions to take 1/2 dose of diabetic medication the day before procedure and he was told no diabetic medication the day of.

## 2016-04-14 NOTE — Progress Notes (Signed)
Pulmonary Individual Treatment Plan  Patient Details  Name: Lance [name] MRN: [id number] Date of Birth: 09-Feb-1946 Referring Provider:   Flowsheet Row PULMONARY REHAB OTHER RESP ORIENTATION from 02/26/2016 in [place]  Referring Provider  Dr. Harl Bowie      Initial Encounter Date:  Flowsheet Row PULMONARY REHAB OTHER RESP ORIENTATION from 02/26/2016 in [facility]  Date  02/26/16  Referring Provider  Dr. Harl Bowie      Visit Diagnosis: SOB (shortness of breath)  Patient's Home Medications on Admission:   Current Outpatient Prescriptions:  .  albuterol (PROVENTIL HFA;VENTOLIN HFA) 108 (90 BASE) MCG/ACT inhaler, Inhale 2 puffs into the lungs every 6 (six) hours as needed for wheezing., Disp: 1 Inhaler, Rfl: 2 .  albuterol (PROVENTIL) (2.5 MG/3ML) 0.083% nebulizer solution, Take 3 mLs (2.5 mg total) by nebulization every 6 (six) hours as needed for wheezing or shortness of breath., Disp: 150 mL, Rfl: 0 .  ALPRAZolam (XANAX) 1 MG tablet, TAKE 1/2-1 TABLET 2 TIMES A DAY AS NEEDED, Disp: 60 tablet, Rfl: 3 .  amLODipine (NORVASC) 2.5 MG tablet, Take 1 tablet (2.5 mg total) by mouth daily., Disp: 30 tablet, Rfl: 11 .  aspirin 81 MG tablet, Take 81 mg by mouth daily., Disp: , Rfl:  .  atorvastatin (LIPITOR) 80 MG tablet, Take 80 mg by mouth daily. , Disp: , Rfl:  .  budesonide-formoterol (SYMBICORT) 160-4.5 MCG/ACT inhaler, Inhale 2 puffs into the lungs 2 (two) times daily., Disp: 1 Inhaler, Rfl: 3 .  clopidogrel (PLAVIX) 75 MG tablet, TAKE ONE TABLET EVERY MORNING WITH BREAKFAST, Disp: 30 tablet, Rfl: 5 .  diclofenac sodium (VOLTAREN) 1 % GEL, Apply 2 g topically 4 (four) times daily., Disp: 1 Tube, Rfl: 0 .  dicyclomine (BENTYL) 10 MG capsule, TAKE ONE CAPSULE EVERY MORNING AND ONE CAPSULE AT BEDTIME AS NEEDED FOR INCREASED STOOLS AND BLOATING, Disp: 60 capsule, Rfl: 5 .  donepezil (ARICEPT) 10 MG tablet, Take 1 tablet (10 mg total) by mouth at  bedtime., Disp: 90 tablet, Rfl: 3 .  doxycycline (VIBRAMYCIN) 100 MG capsule, Take 1 capsule (100 mg total) by mouth 2 (two) times daily., Disp: 20 capsule, Rfl: 0 .  DULoxetine (CYMBALTA) 60 MG capsule, TAKE ONE (1) CAPSULE EACH DAY, Disp: 30 capsule, Rfl: 5 .  empagliflozin (JARDIANCE) 10 MG TABS tablet, Take 10 mg by mouth daily., Disp: , Rfl:  .  fexofenadine (ALLEGRA) 180 MG tablet, Take 180 mg by mouth daily as needed. , Disp: , Rfl:  .  furosemide (LASIX) 20 MG tablet, Take 1 tablet (20 mg total) by mouth daily., Disp: 90 tablet, Rfl: 3 .  gabapentin (NEURONTIN) 300 MG capsule, TAKE ONE TO TWO CAPSULES BY MOUTH THREE TIMES AS DAY AS NEEDED AS NEEDED, Disp: 180 capsule, Rfl: 6 .  glucose blood (ONE TOUCH ULTRA TEST) test strip, Use as instructed 4 times daily. E11.65, Disp: 150 each, Rfl: 1 .  guaiFENesin (MUCINEX) 600 MG 12 hr tablet, Take 1,200 mg by mouth 2 (two) times daily as needed for to loosen phlegm., Disp: , Rfl:  .  HUMALOG KWIKPEN 100 UNIT/ML KiwkPen, Inject 0.32-0.38 mLs (32-38 Units total) into the skin 3 (three) times daily., Disp: 30 pen, Rfl: 2 .  HYDROcodone-acetaminophen (LORTAB) 7.5-500 MG tablet, Take 1 tablet by mouth every 6 (six) hours as needed for pain., Disp: , Rfl:  .  Insulin Detemir (LEVEMIR FLEXTOUCH) 100 UNIT/ML Pen, Inject 90 Units into the skin daily at 10 pm., Disp: 30  pen, Rfl: 2 .  losartan (COZAAR) 25 MG tablet, Take 1 tablet (25 mg total) by mouth daily. (Patient taking differently: Take 50 mg by mouth daily. ), Disp: 30 tablet, Rfl: 11 .  magnesium oxide (MAG-OX) 400 MG tablet, TAKE ONE TABLET ONCE DAILY, Disp: 30 tablet, Rfl: 11 .  metoprolol tartrate (LOPRESSOR) 25 MG tablet, Take 1 tablet (25 mg total) by mouth 2 (two) times daily. (Patient taking differently: Take 50 mg by mouth 2 (two) times daily. ), Disp: 60 tablet, Rfl: 11 .  nitroGLYCERIN (NITROSTAT) 0.4 MG SL tablet, Place 1 tablet (0.4 mg total) under the tongue every 5 (five) minutes as needed.  For chest pain (Patient taking differently: Place 0.4 mg under the tongue every 5 (five) minutes as needed. For chest pain), Disp: 25 tablet, Rfl: 3 .  olopatadine (PATANOL) 0.1 % ophthalmic solution, Place 1 drop into both eyes 2 (two) times daily., Disp: 5 mL, Rfl: 12 .  pantoprazole (PROTONIX) 40 MG tablet, Take 1 tablet (40 mg total) by mouth 2 (two) times daily., Disp: 60 tablet, Rfl: 5 .  polyethylene glycol-electrolytes (TRILYTE) 420 g solution, Take 4,000 mLs by mouth as directed., Disp: 4000 mL, Rfl: 0 .  Probiotic Product (ALIGN PO), Take 1 tablet by mouth daily. , Disp: , Rfl:  .  sildenafil (REVATIO) 20 MG tablet, Take 3 -5 tabs as needed for erectile dysfunction, Disp: 20 tablet, Rfl: 4 .  sulfamethoxazole-trimethoprim (BACTRIM DS,SEPTRA DS) 800-160 MG tablet, Take 1 tablet by mouth 2 (two) times daily. X 10 days, Disp: 20 tablet, Rfl: 0 .  traZODone (DESYREL) 100 MG tablet, TAKE 1/2 TO 1 TABLET AT BEDTIME AS NEEDED FOR SLEEP, Disp: 30 tablet, Rfl: 5 .  UNIFINE PENTIPS 31G X 6 MM MISC, USE PEN NEEDLES 4 TIMES A DAY, Disp: 150 each, Rfl: 0 .  vitamin B-12 (CYANOCOBALAMIN) 1000 MCG tablet, Take 1,000 mcg by mouth daily. Reported on 08/25/2015, Disp: , Rfl:  .  Vitamin D, Ergocalciferol, (DRISDOL) 50000 units CAPS capsule, TAKE ONE CAPSULE BY MOUTH ONCE A WEEK (Patient taking differently: TAKE ONE CAPSULE BY MOUTH ONCE A MONTH -3RD OF MONTH), Disp: 12 capsule, Rfl: 0  Past Medical History: Past Medical History:  Diagnosis Date  . Allergic rhinitis, cause unspecified   . Anxiety and depression   . ASCVD (arteriosclerotic cardiovascular disease)    70% mid left anterior descending lesion on cath in 06/1995; left anterior desending DES placed in 8/03 and RCA stent in 9/03; captain 3/05 revealed 90% second marginal for which PCI was performed, 70% PDA and a total obstruction of the first diagonal and marginal; sudden cardiac death in [place] in 11/13/2003 for which automatic implantable cardiac  defibrillator placed; negativ stress nuclear 10/07  . Atrial fibrillation ([place])   . Benign prostatic hypertrophy   . C. difficile diarrhea   . CAD (coronary artery disease) 1997  . CHF (congestive heart failure) ([place])   . COPD (chronic obstructive pulmonary disease) ([place])   . Coronary artery disease   . CVD (cerebrovascular disease) 05/2008   Transient ischemic attack; carotid ultrasound-plaque without focal disease  . Degenerative joint disease 2002   C-spine fusion   . Diabetes mellitus    Insulin requirement  . Diabetes mellitus without mention of complication   . Diabetic peripheral neuropathy ([place]) 09/06/2014  . Erectile dysfunction   . Esophageal reflux   . HOH (hard of hearing)   . Hx-TIA (transient ischemic attack) 2010  . Hyperlipidemia   . Hypertension   .  ICD (implantable cardiac defibrillator) in place 2005   [place] ICD, for SCD  . Memory deficits 09/05/2013  . Myocardial infarction   . Other testicular hypofunction   . Pacemaker   . S/P endoscopy Dec 2011   RMR: nl esophagus, hyperplastic polyp, active gastritis, no H.pylori.   . Shortness of breath   . Stroke ([place])   . Tobacco abuse    100 pack/year comsuption; cigarettes discontinued 2003; all tobacco products in 2008  . Tubular adenoma   . Vitreous floaters of left eye     Tobacco Use: History  Smoking Status  . Former Smoker  . Packs/day: 3.00  . Years: 40.00  . Types: Cigarettes  . Start date: 05/03/1954  . Quit date: 05/03/1998  Smokeless Tobacco  . Current User  . Types: Chew    Labs: Recent Review Flowsheet Data    Labs for ITP Cardiac and Pulmonary Rehab Latest Ref Rng & Units 07/25/2013 11/06/2014 02/06/2015 05/15/2015 11/21/2015   Cholestrol 125 - 200 mg/dL 154 - - - 125   LDLCALC <130 mg/dL 92 - - - 55   HDL >=40 mg/dL 31(L) - - - 34(L)   Trlycerides <150 mg/dL 155(H) - - - 178(H)   Hemoglobin A1c <5.7 % - 10.2(A) 9.4(A) 9.9(H) 9.0(H)   PHART 7.350 - 7.450 - - - - -   PCO2ART 35.0 - 45.0 mmHg -  - - - -   HCO3 20.0 - 24.0 mEq/L - - - - -   TCO2 0 - 100 mmol/L - - - - -   ACIDBASEDEF 0.0 - 2.0 mmol/L - - - - -   O2SAT % - - - - -      Capillary Blood Glucose: Lab Results  Component Value Date   GLUCAP 166 (H) 07/09/2015   GLUCAP 287 (H) 07/08/2015   GLUCAP 222 (H) 07/08/2015   GLUCAP 118 (H) 07/08/2015   GLUCAP 149 (H) 07/08/2015     ADL UCSD:     Pulmonary Assessment Scores    Row Name 02/26/16 1340         ADL UCSD   ADL Phase Entry     SOB Score total 114     Rest 2     Walk 6     Stairs 4     Bath 2     Dress 2     Shop 3       CAT Score   CAT Score 26       mMRC Score   mMRC Score 4        Pulmonary Function Assessment:     Pulmonary Function Assessment - 02/26/16 1337      Pulmonary Function Tests   FVC% 87 %   FEV1% 88 %   FEV1/FVC Ratio 101   RV% 111 %   DLCO% 92 %     Initial Spirometry Results   FVC% 87 %   FEV1% 88 %   FEV1/FVC Ratio 101     Post Bronchodilator Spirometry Results   FVC% 88 %   FEV1% 94 %   FEV1/FVC Ratio 106     Breath   Shortness of Breath Yes;Limiting activity      Exercise Target Goals:    Exercise Program Goal: Individual exercise prescription set with THRR, safety & activity barriers. Participant demonstrates ability to understand and report RPE using BORG scale, to self-measure pulse accurately, and to acknowledge the importance of the exercise prescription.  Exercise Prescription  Goal: Starting with aerobic activity 30 plus minutes a day, 3 days per week for initial exercise prescription. Provide home exercise prescription and guidelines that participant acknowledges understanding prior to discharge.  Activity Barriers & Risk Stratification:     Activity Barriers & Cardiac Risk Stratification - 02/26/16 1141      Activity Barriers & Cardiac Risk Stratification   Activity Barriers Back Problems;Left Knee Replacement;Joint Problems   Cardiac Risk Stratification High      6 Minute  Walk:     6 Minute Walk    Row Name 02/26/16 1134         6 Minute Walk   Phase Initial     Distance 950 feet     Distance % Change 0 %     Walk Time 6 minutes     # of Rest Breaks 0     MPH 1.79     METS 2.37     RPE 15     Perceived Dyspnea  14     VO2 Peak 6.7     Symptoms No     Resting HR 69 bpm     Resting BP 130/72     Max Ex. HR 82 bpm     Max Ex. BP 150/76     2 Minute Post BP 138/74        Initial Exercise Prescription:     Initial Exercise Prescription - 02/26/16 1000      Date of Initial Exercise RX and Referring Provider   Date 02/26/16   Referring Provider Dr. Harl Bowie     Treadmill   MPH 1   Grade 0   Minutes 15   METs 1.6     Arm Ergometer   Level 2   Watts 15   Minutes 20   METs 1.9     Prescription Details   Frequency (times per week) 3   Duration Progress to 30 minutes of continuous aerobic without signs/symptoms of physical distress     Intensity   THRR REST +  20   THRR 40-80% of Max Heartrate [phone]   Ratings of Perceived Exertion 11-13   Perceived Dyspnea 0-4     Progression   Progression Continue progressive overload as per policy without signs/symptoms or physical distress.     Resistance Training   Training Prescription Yes   Weight 1   Reps 10-12      Perform Capillary Blood Glucose checks as needed.  Exercise Prescription Changes:      Exercise Prescription Changes    Row Name 03/09/16 1200 04/08/16 1500           Exercise Review   Progression Yes Yes        Response to Exercise   Blood Pressure (Admit) 128/62 156/80      Blood Pressure (Exercise) 130/68 162/72      Blood Pressure (Exit) 146/70 140/78      Heart Rate (Admit) 70 bpm 74 bpm      Heart Rate (Exercise) 87 bpm 94 bpm      Heart Rate (Exit) 74 bpm 83 bpm      Oxygen Saturation (Admit) 96 % 95 %      Oxygen Saturation (Exercise) 96 % 94 %      Oxygen Saturation (Exit) 94 % 97 %      Rating of Perceived Exertion (Exercise) 9 8       Perceived Dyspnea (Exercise) 9 11      Duration Progress  to 30 minutes of continuous aerobic without signs/symptoms of physical distress Progress to 30 minutes of continuous aerobic without signs/symptoms of physical distress      Intensity Rest + 20 Rest + 20        Progression   Progression Continue progressive overload as per policy without signs/symptoms or physical distress. Continue progressive overload as per policy without signs/symptoms or physical distress.        Resistance Training   Training Prescription Yes Yes      Weight 3 4      Reps 10-12 10-12        Treadmill   MPH 1.6 1.8      Grade 0 0      Minutes 15 15      METs 2.2 2.3        Arm Ergometer   Level 2.6 2.8      Watts 8 30      Minutes 20 20      METs 2.6 3.2        Home Exercise Plan   Plans to continue exercise at [place] 2 additional days to program exercise sessions. Add 2 additional days to program exercise sessions.         Exercise Comments:      Exercise Comments    Row Name 03/09/16 1223 04/08/16 1510         Exercise Comments Patient is proggressing well. Patient is progressing appropriately          Discharge Exercise Prescription (Final Exercise Prescription Changes):     Exercise Prescription Changes - 04/08/16 1500      Exercise Review   Progression Yes     Response to Exercise   Blood Pressure (Admit) 156/80   Blood Pressure (Exercise) 162/72   Blood Pressure (Exit) 140/78   Heart Rate (Admit) 74 bpm   Heart Rate (Exercise) 94 bpm   Heart Rate (Exit) 83 bpm   Oxygen Saturation (Admit) 95 %   Oxygen Saturation (Exercise) 94 %   Oxygen Saturation (Exit) 97 %   Rating of Perceived Exertion (Exercise) 8   Perceived Dyspnea (Exercise) 11   Duration Progress to 30 minutes of continuous aerobic without signs/symptoms of physical distress   Intensity Rest + 20     Progression   Progression Continue progressive overload as per policy without  signs/symptoms or physical distress.     Resistance Training   Training Prescription Yes   Weight 4   Reps 10-12     Treadmill   MPH 1.8   Grade 0   Minutes 15   METs 2.3     Arm Ergometer   Level 2.8   Watts 30   Minutes 20   METs 3.2     Home Exercise Plan   Plans to continue exercise at Home   Frequency Add 2 additional days to program exercise sessions.      Nutrition:  Target Goals: Understanding of nutrition guidelines, daily intake of sodium 1500mg , cholesterol 200mg , calories 30% from fat and 7% or less from saturated fats, daily to have 5 or more servings of fruits and vegetables.  Biometrics:     Pre Biometrics - 02/26/16 1037      Pre Biometrics   Height 5\' 9"  (1.753 m)   Weight 236 lb 12.4 oz (107.4 kg)   Waist Circumference 44.5 inches   Hip Circumference 42.5 inches   Waist to Hip Ratio  1.05 %   BMI (Calculated) 35   Triceps Skinfold 6 mm   % Body Fat 28.6 %   Grip Strength 81.33 kg   Flexibility 0 in   Single Leg Stand 3 seconds       Nutrition Therapy Plan and Nutrition Goals:   Nutrition Discharge: Rate Your Plate Scores:     Nutrition Assessments - 02/26/16 1344      Rate Your Plate Scores   Pre Score 17      Psychosocial: Target Goals: Acknowledge presence or absence of depression, maximize coping skills, provide positive support system. Participant is able to verbalize types and ability to use techniques and skills needed for reducing stress and depression.  Initial Review & Psychosocial Screening:     Initial Psych Review & Screening - 02/26/16 1402      Initial Review   Current issues with Current Anxiety/Panic  Main concern is being active again w/o SOB.     Family Dynamics   Good Support System? Yes     Barriers   Psychosocial barriers to participate in program Psychosocial barriers identified (see note);The patient should benefit from training in stress management and relaxation.  Though the patients QOL was  23.09, his PHQ-9 was 9. He said he feels depressed and hopeless. He siad it is more based in the fact he can't do anything without getting breathless.       Screening Interventions   Interventions Encouraged to exercise      Quality of Life Scores:     Quality of Life - 02/26/16 1038      Quality of Life Scores   Health/Function Pre 19.5 %   Socioeconomic Pre 24.86 %   Psych/Spiritual Pre 29.14 %   Family Pre 24 %   GLOBAL Pre 23.09 %      PHQ-9: Recent Review Flowsheet Data    Depression screen [facility] 2/9 02/26/2016 11/25/2015 08/25/2015 07/04/2015 06/04/2015   Decreased Interest 3 0 0 0 0   Down, Depressed, Hopeless 3 0 0 0 0   PHQ - 2 Score 6 0 0 0 0   Altered sleeping 0 - - - -   Tired, decreased energy 3 - - - -   Change in appetite 0 - - - -   Feeling bad or failure about yourself  0 - - - -   Trouble concentrating 0 - - - -   Moving slowly or fidgety/restless 0 - - - -   PHQ-9 Score 9 - - - -   Difficult doing work/chores Not difficult at all - - - -      Psychosocial Evaluation and Intervention:     Psychosocial Evaluation - 02/26/16 1406      Psychosocial Evaluation & Interventions   Interventions Stress management education;Relaxation education;Encouraged to exercise with the program and follow exercise prescription   Continued Psychosocial Services Needed Yes  The patient does not feel he needs counseling. From my observation he would benefit from consulting with a counselor.       Psychosocial Re-Evaluation:     Psychosocial Re-Evaluation    Row Name 03/17/16 1441 04/14/16 1501           Psychosocial Re-Evaluation   Interventions Encouraged to attend Pulmonary Rehabilitation for the exercise Encouraged to attend Pulmonary Rehabilitation for the exercise      Comments Patient's QOL score was 23.09. He says he is not depressed and does not need counseling.  Patient continues to have no psychosicoal issues  identified.       Continued Psychosocial Services  Needed No No         Education: Education Goals: Education classes will be provided on a weekly basis, covering required topics. Participant will state understanding/return demonstration of topics presented.  Learning Barriers/Preferences:     Learning Barriers/Preferences - 02/26/16 1142      Learning Barriers/Preferences   Learning Barriers None   Learning Preferences Individual Instruction;Skilled Demonstration;Group Instruction      Education Topics: How Lungs Work and Diseases: - Discuss the anatomy of the lungs and diseases that can affect the lungs, such as COPD. Flowsheet Row PULMONARY REHAB OTHER RESPIRATORY from 04/08/2016 in [place]  Date  03/04/16  Educator  CG  Instruction Review Code  2- meets goals/outcomes      Exercise: -Discuss the importance of exercise, FITT principles of exercise, normal and abnormal responses to exercise, and how to exercise safely. Flowsheet Row PULMONARY REHAB OTHER RESPIRATORY from 04/08/2016 in [place]  Date  03/11/16  Educator  [place]  Instruction Review Code  2- meets goals/outcomes      Environmental Irritants: -Discuss types of environmental irritants and how to limit exposure to environmental irritants. Flowsheet Row PULMONARY REHAB OTHER RESPIRATORY from 04/08/2016 in [place]  Date  03/18/16  Educator  Nils Flack  Instruction Review Code  2- meets goals/outcomes      Meds/Inhalers and oxygen: - Discuss respiratory medications, definition of an inhaler and oxygen, and the proper way to use an inhaler and oxygen.   Energy Saving Techniques: - Discuss methods to conserve energy and decrease shortness of breath when performing activities of daily living.  Flowsheet Row PULMONARY REHAB OTHER RESPIRATORY from 04/08/2016 in [place]  Date  04/01/16  Educator  Suzanne Boron  Instruction Review Code  2- meets goals/outcomes       Bronchial Hygiene / Breathing Techniques: - Discuss breathing mechanics, pursed-lip breathing technique,  proper posture, effective ways to clear airways, and other functional breathing techniques Flowsheet Row PULMONARY REHAB OTHER RESPIRATORY from 04/08/2016 in [facility]  Date  04/08/16  Educator  Nils Flack   Instruction Review Code  2- meets goals/outcomes      Cleaning Equipment: - Provides group verbal and written instruction about the health risks of elevated stress, cause of high stress, and healthy ways to reduce stress.   Nutrition I: Fats: - Discuss the types of cholesterol, what cholesterol does to the body, and how cholesterol levels can be controlled.   Nutrition II: Labels: -Discuss the different components of food labels and how to read food labels.   Respiratory Infections: - Discuss the signs and symptoms of respiratory infections, ways to prevent respiratory infections, and the importance of seeking medical treatment when having a respiratory infection.   Stress I: Signs and Symptoms: - Discuss the causes of stress, how stress may lead to anxiety and depression, and ways to limit stress.   Stress II: Relaxation: -Discuss relaxation techniques to limit stress.   Oxygen for Home/Travel: - Discuss how to prepare for travel when on oxygen and proper ways to transport and store oxygen to ensure safety.   Knowledge Questionnaire Score:     Knowledge Questionnaire Score - 02/26/16 1335      Knowledge Questionnaire Score   Pre Score 10/14      Core Components/Risk Factors/Patient Goals at Admission:     Personal Goals and Risk Factors at Admission -  02/26/16 1344      Core Components/Risk Factors/Patient Goals on Admission    Weight Management Yes   Intervention Weight Management/Obesity: Establish reasonable short term and long term weight goals.   Admit Weight 237 lb (107.5 kg)   Goal Weight: Short Term 227 lb (103 kg)    Goal Weight: Long Term 217 lb (98.4 kg)   Expected Outcomes Short Term: Continue to assess and modify interventions until short term weight is achieved;Long Term: Adherence to nutrition and physical activity/exercise program aimed toward attainment of established weight goal   Sedentary Yes   Intervention Provide advice, education, support and counseling about physical activity/exercise needs.;Develop an individualized exercise prescription for aerobic and resistive training based on initial evaluation findings, risk stratification, comorbidities and participant's personal goals.   Expected Outcomes Achievement of increased cardiorespiratory fitness and enhanced flexibility, muscular endurance and strength shown through measurements of functional capacity and personal statement of participant.   Increase Strength and Stamina Yes   Intervention Provide advice, education, support and counseling about physical activity/exercise needs.;Develop an individualized exercise prescription for aerobic and resistive training based on initial evaluation findings, risk stratification, comorbidities and participant's personal goals.   Expected Outcomes Achievement of increased cardiorespiratory fitness and enhanced flexibility, muscular endurance and strength shown through measurements of functional capacity and personal statement of participant.   Improve shortness of breath with ADL's Yes   Intervention Provide education, individualized exercise plan and daily activity instruction to help decrease symptoms of SOB with activities of daily living.   Expected Outcomes Short Term: Achieves a reduction of symptoms when performing activities of daily living.   Develop more efficient breathing techniques such as purse lipped breathing and diaphragmatic breathing; and practicing self-pacing with activity Yes   Intervention Provide education, demonstration and support about specific breathing techniuqes utilized for more  efficient breathing. Include techniques such as pursed lipped breathing, diaphragmatic breathing and self-pacing activity.   Expected Outcomes Short Term: Participant will be able to demonstrate and use breathing techniques as needed throughout daily activities.   Diabetes Yes   Intervention Provide education about signs/symptoms and action to take for hypo/hyperglycemia.   Expected Outcomes Long Term: Attainment of HbA1C < 7%.;Short Term: Participant verbalizes understanding of the signs/symptoms and immediate care of hyper/hypoglycemia, proper foot care and importance of medication, aerobic/resistive exercise and nutrition plan for blood glucose control.   Stress Yes   Intervention Offer individual and/or small group education and counseling on adjustment to heart disease, stress management and health-related lifestyle change. Teach and support self-help strategies.   Expected Outcomes Short Term: Participant demonstrates changes in health-related behavior, relaxation and other stress management skills, ability to obtain effective social support, and compliance with psychotropic medications if prescribed.;Long Term: Emotional wellbeing is indicated by absence of clinically significant psychosocial distress or social isolation.   Personal Goal Other Yes   Personal Goal Lose weight 10-20lbs before graduation. then continue to lose up to 50lbs. Breath better.   Intervention Attend Pulmonary Rehab 2 x week and then supplement exercise at home with 3 x week.    Expected Outcomes Reach personal goals.       Core Components/Risk Factors/Patient Goals Review:      Goals and Risk Factor Review    Row Name 02/26/16 1402 03/17/16 1440 04/14/16 1459         Core Components/Risk Factors/Patient Goals Review   Personal Goals Review Weight Management/Obesity;Improve shortness of breath with ADL's;Develop more efficient breathing techniques such as purse lipped breathing and  diaphragmatic breathing and  practicing self-pacing with activity.;Diabetes;Stress Weight Management/Obesity;Increase Strength and Stamina;Improve shortness of breath with ADL's;Develop more efficient breathing techniques such as purse lipped breathing and diaphragmatic breathing and practicing self-pacing with activity.;Diabetes;Stress Weight Management/Obesity;Sedentary;Improve shortness of breath with ADL's;Increase Strength and Stamina;Develop more efficient breathing techniques such as purse lipped breathing and diaphragmatic breathing and practicing self-pacing with activity.;Stress;Increase knowledge of respiratory medications and ability to use respiratory devices properly.  Lose weight 10-20lbs before graduation. then continue to lose up to 50lbs. Breath better.     Review  - Patient new to program. He has attended 6 sessions. Will continue to monitor for progress.  Patient has attended 12 sessions losing 2 lbs. He is doing well in the program with some increased strength and stamina and some improvement in his SOB.      Expected Outcomes  - Patient will complete the program meeting his personal goals.  Patient will continue to attend sessions meeting his personal goals.         Core Components/Risk Factors/Patient Goals at Discharge (Final Review):      Goals and Risk Factor Review - 04/14/16 1459      Core Components/Risk Factors/Patient Goals Review   Personal Goals Review Weight Management/Obesity;Sedentary;Improve shortness of breath with ADL's;Increase Strength and Stamina;Develop more efficient breathing techniques such as purse lipped breathing and diaphragmatic breathing and practicing self-pacing with activity.;Stress;Increase knowledge of respiratory medications and ability to use respiratory devices properly.  Lose weight 10-20lbs before graduation. then continue to lose up to 50lbs. Breath better.   Review Patient has attended 12 sessions losing 2 lbs. He is doing well in the program with some increased  strength and stamina and some improvement in his SOB.    Expected Outcomes Patient will continue to attend sessions meeting his personal goals.       ITP Comments:   Comments: ITP 30 Day REVIEW Pt is making expected progress toward pulmonary rehab goals after completing 12 sessions. Recommend continued exercise, life style modification, education, and utilization of breathing techniques to increase stamina and strength and decrease shortness of breath with exertion.

## 2016-04-15 ENCOUNTER — Encounter (HOSPITAL_COMMUNITY)
Admission: RE | Admit: 2016-04-15 | Discharge: 2016-04-15 | Disposition: A | Discharge status: Home / Self Care | Payer: Medicare Other | Source: Ambulatory Visit | Attending: Cardiology | Admitting: Cardiology

## 2016-04-15 ENCOUNTER — Ambulatory Visit (INDEPENDENT_AMBULATORY_CARE_PROVIDER_SITE_OTHER): Payer: Medicare Other | Admitting: *Deleted

## 2016-04-15 DIAGNOSIS — J45909 Unspecified asthma, uncomplicated: Secondary | ICD-10-CM | POA: Diagnosis not present

## 2016-04-15 DIAGNOSIS — R0602 Shortness of breath: Secondary | ICD-10-CM | POA: Diagnosis not present

## 2016-04-15 DIAGNOSIS — I255 Ischemic cardiomyopathy: Secondary | ICD-10-CM | POA: Diagnosis not present

## 2016-04-15 NOTE — Progress Notes (Signed)
Remote ICD transmission.   

## 2016-04-15 NOTE — Progress Notes (Signed)
Daily Session Note  Patient Details  Name: Lance [name] MRN: [id number] Date of Birth: 11-14-1945 Referring Provider:   Flowsheet Row PULMONARY REHAB OTHER RESP ORIENTATION from 02/26/2016 in [place]  Referring Provider  Dr. Harl Bowie      Encounter Date: 04/15/2016  Check In:     Session Check In - 04/15/16 1045      Check-In   Location [facility]   Staff Present Aundra Dubin, RN, BSN;Negar Sieler Luther Parody, BS, EP, Exercise Physiologist   Supervising physician immediately available to respond to emergencies See telemetry face sheet for immediately available MD   Medication changes reported     No   Fall or balance concerns reported    No   Warm-up and Cool-down Performed as group-led instruction   Resistance Training Performed Yes   VAD Patient? No     Pain Assessment   Currently in Pain? No/denies   Pain Score 0-No pain   Multiple Pain Sites No      Capillary Blood Glucose: No results found for this or any previous visit (from the past 24 hour(s)).   Goals Met:  Independence with exercise equipment Improved SOB with ADL's Using PLB without cueing & demonstrates good technique Exercise tolerated well No report of cardiac concerns or symptoms Strength training completed today  Goals Unmet:  Not Applicable  Comments: Check out 1145   Dr. Sinda Du is Medical Director for [facility].

## 2016-04-20 ENCOUNTER — Encounter (HOSPITAL_COMMUNITY)
Admission: RE | Admit: 2016-04-20 | Discharge: 2016-04-20 | Disposition: A | Discharge status: Home / Self Care | Payer: Medicare Other | Source: Ambulatory Visit | Attending: Cardiology | Admitting: Cardiology

## 2016-04-20 NOTE — Progress Notes (Signed)
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  Due to being extremely SOB. His BP was 128/60, SaO2 97, HR 78. Patient CBG was 324. Patient said he did not feel he could exercise due to his SOB.

## 2016-04-21 ENCOUNTER — Encounter: Payer: Self-pay | Admitting: Cardiology

## 2016-04-22 ENCOUNTER — Encounter (HOSPITAL_COMMUNITY): Payer: Medicare Other

## 2016-04-27 ENCOUNTER — Encounter (HOSPITAL_COMMUNITY): Admission: RE | Admit: 2016-04-27 | Payer: Medicare Other | Source: Ambulatory Visit

## 2016-04-27 DIAGNOSIS — Z794 Long term (current) use of insulin: Secondary | ICD-10-CM | POA: Diagnosis not present

## 2016-04-27 DIAGNOSIS — R232 Flushing: Secondary | ICD-10-CM | POA: Diagnosis not present

## 2016-04-27 DIAGNOSIS — E1142 Type 2 diabetes mellitus with diabetic polyneuropathy: Secondary | ICD-10-CM | POA: Diagnosis not present

## 2016-04-27 DIAGNOSIS — Z23 Encounter for immunization: Secondary | ICD-10-CM | POA: Diagnosis not present

## 2016-04-27 DIAGNOSIS — I2510 Atherosclerotic heart disease of native coronary artery without angina pectoris: Secondary | ICD-10-CM | POA: Diagnosis not present

## 2016-04-27 DIAGNOSIS — E1165 Type 2 diabetes mellitus with hyperglycemia: Secondary | ICD-10-CM | POA: Diagnosis not present

## 2016-04-27 DIAGNOSIS — R61 Generalized hyperhidrosis: Secondary | ICD-10-CM | POA: Diagnosis not present

## 2016-04-28 DIAGNOSIS — R61 Generalized hyperhidrosis: Secondary | ICD-10-CM | POA: Diagnosis not present

## 2016-04-28 DIAGNOSIS — R232 Flushing: Secondary | ICD-10-CM | POA: Diagnosis not present

## 2016-04-29 ENCOUNTER — Encounter (HOSPITAL_COMMUNITY)
Admission: RE | Admit: 2016-04-29 | Discharge: 2016-04-29 | Disposition: A | Discharge status: Home / Self Care | Payer: Medicare Other | Source: Ambulatory Visit | Attending: Cardiology | Admitting: Cardiology

## 2016-04-29 DIAGNOSIS — R0602 Shortness of breath: Secondary | ICD-10-CM

## 2016-04-29 DIAGNOSIS — J45909 Unspecified asthma, uncomplicated: Secondary | ICD-10-CM | POA: Diagnosis not present

## 2016-04-29 NOTE — Progress Notes (Signed)
Daily Session Note  Patient Details  Name: Lance [name] MRN: [id number] Date of Birth: [birth year] Referring Provider:   Flowsheet Row PULMONARY REHAB OTHER RESP ORIENTATION from 02/26/2016 in [place]  Referring Provider  Dr. Harl Bowie      Encounter Date: 04/29/2016  Check In:     Session Check In - 04/29/16 1045      Check-In   Location [facility]   Staff Present Diane Angelina Pih, MS, EP, [facility], Exercise Physiologist;Ester Mabe Luther Parody, BS, EP, Exercise Physiologist   Supervising physician immediately available to respond to emergencies See telemetry face sheet for immediately available MD   Medication changes reported     No   Fall or balance concerns reported    No   Warm-up and Cool-down Performed as group-led instruction   Resistance Training Performed Yes   VAD Patient? No     Pain Assessment   Currently in Pain? No/denies   Pain Score 0-No pain   Multiple Pain Sites No      Capillary Blood Glucose: No results found for this or any previous visit (from the past 24 hour(s)).   Goals Met:  Independence with exercise equipment Improved SOB with ADL's Using PLB without cueing & demonstrates good technique Exercise tolerated well No report of cardiac concerns or symptoms Strength training completed today  Goals Unmet:  Not Applicable  Comments: Check out 1145   Dr. Sinda Du is Medical Director for [facility].

## 2016-04-30 NOTE — Patient Instructions (Signed)
DESHANNON DEMARCE  04/30/2016     @PREFPERIOPPHARMACY @   Your procedure is scheduled on  05/10/2016   Report to [facility] at  1000 A.M.  Call this number if you have problems the morning of surgery:  [phone]   Remember:  Do not eat food or drink liquids after midnight.  Take these medicines the morning of surgery with A SIP OF WATER  Xanax, amlodipine, bentyl, aricept, cymbalta, allegra, gabapentin, hydrocodone, cozaar, metoprolol, protonix. Take your inhaler before you come and your nebulizer before you come and bring your rescue inhaler with you. Take 1/2 of your usual insulin dosage the night before your surgery. DO NOT take any medications for diabetes the morning of your procedure.   Do not wear jewelry, make-up or nail polish.  Do not wear lotions, powders, or perfumes, or deoderant.  Do not shave 48 hours prior to surgery.  Men may shave face and neck.  Do not bring valuables to the hospital.  [facility] is not responsible for any belongings or valuables.  Contacts, dentures or bridgework may not be worn into surgery.  Leave your suitcase in the car.  After surgery it may be brought to your room.  For patients admitted to the hospital, discharge time will be determined by your treatment team.  Patients discharged the day of surgery will not be allowed to drive home.   Name and phone number of your driver:   family Special instructions: Follow the diet and prep instructions given to you by Dr Roseanne Kaufman office.  Please read over the following fact sheets that you were given. Anesthesia Post-op Instructions and Care and Recovery After Surgery       Esophagogastroduodenoscopy Introduction Esophagogastroduodenoscopy (EGD) is a procedure to examine the lining of the esophagus, stomach, and first part of the small intestine (duodenum). This procedure is done to check for problems such as inflammation, bleeding, ulcers, or growths. During this  procedure, a long, flexible, lighted tube with a camera attached (endoscope) is inserted down the throat. Tell a health care provider about:  Any allergies you have.  All medicines you are taking, including vitamins, herbs, eye drops, creams, and over-the-counter medicines.  Any problems you or family members have had with anesthetic medicines.  Any blood disorders you have.  Any surgeries you have had.  Any medical conditions you have.  Whether you are pregnant or may be pregnant. What are the risks? Generally, this is a safe procedure. However, problems may occur, including:  Infection.  Bleeding.  A tear (perforation) in the esophagus, stomach, or duodenum.  Trouble breathing.  Excessive sweating.  Spasms of the larynx.  A slowed heartbeat.  Low blood pressure. What happens before the procedure?  Follow instructions from your health care provider about eating or drinking restrictions.  Ask your health care provider about:  Changing or stopping your regular medicines. This is especially important if you are taking diabetes medicines or blood thinners.  Taking medicines such as aspirin and ibuprofen. These medicines can thin your blood. Do not take these medicines before your procedure if your health care provider instructs you not to.  Plan to have someone take you home after the procedure.  If you wear dentures, be ready to remove them before the procedure. What happens during the procedure?  To reduce your risk of infection, your health care team will wash or sanitize their hands.  An IV tube will be put in a vein in your hand or arm. You will get medicines and fluids through this tube.  You will be given one or more of the following:  A medicine to help you relax (sedative).  A medicine to numb the area (local anesthetic). This medicine may be sprayed into your throat. It will make you feel more comfortable and keep you from gagging or coughing during the  procedure.  A medicine for pain.  A mouth guard may be placed in your mouth to protect your teeth and to keep you from biting on the endoscope.  You will be asked to lie on your left side.  The endoscope will be lowered down your throat into your esophagus, stomach, and duodenum.  Air will be put into the endoscope. This will help your health care provider see better.  The lining of your esophagus, stomach, and duodenum will be examined.  Your health care provider may:  Take a tissue sample so it can be looked at in a lab (biopsy).  Remove growths.  Remove objects (foreign bodies) that are stuck.  Treat any bleeding with medicines or other devices that stop tissue from bleeding.  Widen (dilate) or stretch narrowed areas of your esophagus and stomach.  The endoscope will be taken out. The procedure may vary among health care providers and hospitals. What happens after the procedure?  Your blood pressure, heart rate, breathing rate, and blood oxygen level will be monitored often until the medicines you were given have worn off.  Do not eat or drink anything until the numbing medicine has worn off and your gag reflex has returned. This information is not intended to replace advice given to you by your health care provider. Make sure you discuss any questions you have with your health care provider. Document Released: 08/20/2004 Document Revised: 09/25/2015 Document Reviewed: 03/13/2015  2017 Elsevier Esophagogastroduodenoscopy, Care After Introduction Refer to this sheet in the next few weeks. These instructions provide you with information about caring for yourself after your procedure. Your health care provider may also give you more specific instructions. Your treatment has been planned according to current medical practices, but problems sometimes occur. Call your health care provider if you have any problems or questions after your procedure. What can I expect after the  procedure? After the procedure, it is common to have:  A sore throat.  Nausea.  Bloating.  Dizziness.  Fatigue. Follow these instructions at home:  Do not eat or drink anything until the numbing medicine (local anesthetic) has worn off and your gag reflex has returned. You will know that the local anesthetic has worn off when you can swallow comfortably.  Do not drive for 24 hours if you received a medicine to help you relax (sedative).  If your health care provider took a tissue sample for testing during the procedure, make sure to get your test results. This is your responsibility. Ask your health care provider or the department performing the test when your results will be ready.  Keep all follow-up visits as told by your health care provider. This is important. Contact a health care provider if:  You cannot stop coughing.  You are not urinating.  You are urinating less than usual. Get help right away if:  You have trouble swallowing.  You cannot eat or drink.  You have throat or chest pain that gets worse.  You are dizzy or light-headed.  You faint.  You have nausea or  vomiting.  You have chills.  You have a fever.  You have severe abdominal pain.  You have black, tarry, or bloody stools. This information is not intended to replace advice given to you by your health care provider. Make sure you discuss any questions you have with your health care provider. Document Released: 04/05/2012 Document Revised: 09/25/2015 Document Reviewed: 03/13/2015  2017 Elsevier  Esophageal Dilatation Esophageal dilatation is a procedure to open a blocked or narrowed part of the esophagus. The esophagus is the long tube in your throat that carries food and liquid from your mouth to your stomach. The procedure is also called esophageal dilation. You may need this procedure if you have a buildup of scar tissue in your esophagus that makes it difficult, painful, or even impossible  to swallow. This can be caused by gastroesophageal reflux disease (GERD). In rare cases, people need this procedure because they have cancer of the esophagus or a problem with the way food moves through the esophagus. Sometimes you may need to have another dilatation to enlarge the opening of the esophagus gradually. Tell a health care provider about:  Any allergies you have.  All medicines you are taking, including vitamins, herbs, eye drops, creams, and over-the-counter medicines.  Any problems you or family members have had with anesthetic medicines.  Any blood disorders you have.  Any surgeries you have had.  Any medical conditions you have.  Any antibiotic medicines you are required to take before dental procedures. What are the risks? Generally, this is a safe procedure. However, problems can occur and include:  Bleeding from a tear in the lining of the esophagus.  A hole (perforation) in the esophagus. What happens before the procedure?  Do not eat or drink anything after midnight on the night before the procedure or as directed by your health care provider.  Ask your health care provider about changing or stopping your regular medicines. This is especially important if you are taking diabetes medicines or blood thinners.  Plan to have someone take you home after the procedure. What happens during the procedure?  You will be given a medicine that makes you relaxed and sleepy (sedative).  A medicine may be sprayed or gargled to numb the back of the throat.  Your health care provider can use various instruments to do an esophageal dilatation. During the procedure, the instrument used will be placed in your mouth and passed down into your esophagus. Options include:  Simple dilators. This instrument is carefully placed in the esophagus to stretch it.  Guided wire bougies. In this method, a flexible tube (endoscope) is used to insert a wire into the esophagus. The dilator is  passed over this wire to enlarge the esophagus. Then the wire is removed.  Balloon dilators. An endoscope with a small balloon at the end is passed down into the esophagus. Inflating the balloon gently stretches the esophagus and opens it up. What happens after the procedure?  Your blood pressure, heart rate, breathing rate, and blood oxygen level will be monitored often until the medicines you were given have worn off.  Your throat may feel slightly sore and will probably still feel numb. This will improve slowly over time.  You will not be allowed to eat or drink until the throat numbness has resolved.  If this is a same-day procedure, you may be allowed to go home once you have been able to drink, urinate, and sit on the edge of the bed without nausea or  dizziness.  If this is a same-day procedure, you should have a friend or family member with you for the next 24 hours after the procedure. This information is not intended to replace advice given to you by your health care provider. Make sure you discuss any questions you have with your health care provider. Document Released: 06/10/2005 Document Revised: 09/25/2015 Document Reviewed: 08/29/2013 Elsevier Interactive Patient Education  2017 [place].  Colonoscopy, Adult A colonoscopy is an exam to look at the entire large intestine. During the exam, a lubricated, bendable tube is inserted into the anus and then passed into the rectum, colon, and other parts of the large intestine. A colonoscopy is often done as a part of normal colorectal screening or in response to certain symptoms, such as anemia, persistent diarrhea, abdominal pain, and blood in the stool. The exam can help screen for and diagnose medical problems, including:  Tumors.  Polyps.  Inflammation.  Areas of bleeding. Tell a health care provider about:  Any allergies you have.  All medicines you are taking, including vitamins, herbs, eye drops, creams, and  over-the-counter medicines.  Any problems you or family members have had with anesthetic medicines.  Any blood disorders you have.  Any surgeries you have had.  Any medical conditions you have.  Any problems you have had passing stool. What are the risks? Generally, this is a safe procedure. However, problems may occur, including:  Bleeding.  A tear in the intestine.  A reaction to medicines given during the exam.  Infection (rare). What happens before the procedure? Eating and drinking restrictions  Follow instructions from your health care provider about eating and drinking, which may include:  A few days before the procedure - follow a low-fiber diet. Avoid nuts, seeds, dried fruit, raw fruits, and vegetables.  1-3 days before the procedure - follow a clear liquid diet. Drink only clear liquids, such as clear broth or bouillon, black coffee or tea, clear juice, clear soft drinks or sports drinks, gelatin desert, and popsicles. Avoid any liquids that contain red or purple dye.  On the day of the procedure - do not eat or drink anything during the 2 hours before the procedure, or within the time period that your health care provider recommends. Bowel prep  If you were prescribed an oral bowel prep to clean out your colon:  Take it as told by your health care provider. Starting the day before your procedure, you will need to drink a large amount of medicated liquid. The liquid will cause you to have multiple loose stools until your stool is almost clear or light green.  If your skin or anus gets irritated from diarrhea, you may use these to relieve the irritation:  Medicated wipes, such as adult wet wipes with aloe and vitamin E.  A skin soothing-product like petroleum jelly.  If you vomit while drinking the bowel prep, take a break for up to 60 minutes and then begin the bowel prep again. If vomiting continues and you cannot take the bowel prep without vomiting, call your  health care provider. General instructions  Ask your health care provider about changing or stopping your regular medicines. This is especially important if you are taking diabetes medicines or blood thinners.  Plan to have someone take you home from the hospital or clinic. What happens during the procedure?  An IV tube may be inserted into one of your veins.  You will be given medicine to help you relax (sedative).  To reduce your risk of infection:  Your health care team will wash or sanitize their hands.  Your anal area will be washed with soap.  You will be asked to lie on your side with your knees bent.  Your health care provider will lubricate a long, thin, flexible tube. The tube will have a camera and a light on the end.  The tube will be inserted into your anus.  The tube will be gently eased through your rectum and colon.  Air will be delivered into your colon to keep it open. You may feel some pressure or cramping.  The camera will be used to take images during the procedure.  A small tissue sample may be removed from your body to be examined under a microscope (biopsy). If any potential problems are found, the tissue will be sent to a lab for testing.  If small polyps are found, your health care provider may remove them and have them checked for cancer cells.  The tube that was inserted into your anus will be slowly removed. The procedure may vary among health care providers and hospitals. What happens after the procedure?  Your blood pressure, heart rate, breathing rate, and blood oxygen level will be monitored until the medicines you were given have worn off.  Do not drive for 24 hours after the exam.  You may have a small amount of blood in your stool.  You may pass gas and have mild abdominal cramping or bloating due to the air that was used to inflate your colon during the exam.  It is up to you to get the results of your procedure. Ask your health care  provider, or the department performing the procedure, when your results will be ready. This information is not intended to replace advice given to you by your health care provider. Make sure you discuss any questions you have with your health care provider. Document Released: 04/16/2000 Document Revised: 11/07/2015 Document Reviewed: 07/01/2015 Elsevier Interactive Patient Education  2017 [employer]  Colonoscopy, Adult, Care After This sheet gives you information about how to care for yourself after your procedure. Your health care provider may also give you more specific instructions. If you have problems or questions, contact your health care provider. What can I expect after the procedure? After the procedure, it is common to have:  A small amount of blood in your stool for 24 hours after the procedure.  Some gas.  Mild abdominal cramping or bloating. Follow these instructions at home: General instructions  For the first 24 hours after the procedure:  Do not drive or use machinery.  Do not sign important documents.  Do not drink alcohol.  Do your regular daily activities at a slower pace than normal.  Eat soft, easy-to-digest foods.  Rest often.  Take over-the-counter or prescription medicines only as told by your health care provider.  It is up to you to get the results of your procedure. Ask your health care provider, or the department performing the procedure, when your results will be ready. Relieving cramping and bloating  Try walking around when you have cramps or feel bloated.  Apply heat to your abdomen as told by your health care provider. Use a heat source that your health care provider recommends, such as a moist heat pack or a heating pad.  Place a towel between your skin and the heat source.  Leave the heat on for 20-30 minutes.  Remove the heat if your skin turns  bright red. This is especially important if you are unable to feel pain, heat, or cold.  You may have a greater risk of getting burned. Eating and drinking  Drink enough fluid to keep your urine clear or pale yellow.  Resume your normal diet as instructed by your health care provider. Avoid heavy or fried foods that are hard to digest.  Avoid drinking alcohol for as long as instructed by your health care provider. Contact a health care provider if:  You have blood in your stool 2-3 days after the procedure. Get help right away if:  You have more than a small spotting of blood in your stool.  You pass large blood clots in your stool.  Your abdomen is swollen.  You have nausea or vomiting.  You have a fever.  You have increasing abdominal pain that is not relieved with medicine. This information is not intended to replace advice given to you by your health care provider. Make sure you discuss any questions you have with your health care provider. Document Released: 12/02/2003 Document Revised: 01/12/2016 Document Reviewed: 07/01/2015 Elsevier Interactive Patient Education  2017 [place] Anesthesia is a term that refers to techniques, procedures, and medicines that help a person stay safe and comfortable during a medical procedure. Monitored anesthesia care, or sedation, is one type of anesthesia. Your anesthesia specialist may recommend sedation if you will be having a procedure that does not require you to be unconscious, such as:  Cataract surgery.  A dental procedure.  A biopsy.  A colonoscopy. During the procedure, you may receive a medicine to help you relax (sedative). There are three levels of sedation:  Mild sedation. At this level, you may feel awake and relaxed. You will be able to follow directions.  Moderate sedation. At this level, you will be sleepy. You may not remember the procedure.  Deep sedation. At this level, you will be asleep. You will not remember the procedure. The more medicine you are given, the deeper  your level of sedation will be. Depending on how you respond to the procedure, the anesthesia specialist may change your level of sedation or the type of anesthesia to fit your needs. An anesthesia specialist will monitor you closely during the procedure. Let your health care provider know about:  Any allergies you have.  All medicines you are taking, including vitamins, herbs, eye drops, creams, and over-the-counter medicines.  Any use of steroids (by mouth or as a cream).  Any problems you or family members have had with sedatives and anesthetic medicines.  Any blood disorders you have.  Any surgeries you have had.  Any medical conditions you have, such as sleep apnea.  Whether you are pregnant or may be pregnant.  Any use of cigarettes, alcohol, or street drugs. What are the risks? Generally, this is a safe procedure. However, problems may occur, including:  Getting too much medicine (oversedation).  Nausea.  Allergic reaction to medicines.  Trouble breathing. If this happens, a breathing tube may be used to help with breathing. It will be removed when you are awake and breathing on your own.  Heart trouble.  Lung trouble. Before the procedure Staying hydrated  Follow instructions from your health care provider about hydration, which may include:  Up to 2 hours before the procedure - you may continue to drink clear liquids, such as water, clear fruit juice, black coffee, and plain tea. Eating and drinking restrictions  Follow instructions from your health  care provider about eating and drinking, which may include:  8 hours before the procedure - stop eating heavy meals or foods such as meat, fried foods, or fatty foods.  6 hours before the procedure - stop eating light meals or foods, such as toast or cereal.  6 hours before the procedure - stop drinking milk or drinks that contain milk.  2 hours before the procedure - stop drinking clear liquids. Medicines  Ask  your health care provider about:  Changing or stopping your regular medicines. This is especially important if you are taking diabetes medicines or blood thinners.  Taking medicines such as aspirin and ibuprofen. These medicines can thin your blood. Do not take these medicines before your procedure if your health care provider instructs you not to. Tests and exams  You will have a physical exam.  You may have blood tests done to show:  How well your kidneys and liver are working.  How well your blood can clot.  General instructions  Plan to have someone take you home from the hospital or clinic.  If you will be going home right after the procedure, plan to have someone with you for 24 hours. What happens during the procedure?  Your blood pressure, heart rate, breathing, level of pain and overall condition will be monitored.  An IV tube will be inserted into one of your veins.  Your anesthesia specialist will give you medicines as needed to keep you comfortable during the procedure. This may mean changing the level of sedation.  The procedure will be performed. After the procedure  Your blood pressure, heart rate, breathing rate, and blood oxygen level will be monitored until the medicines you were given have worn off.  Do not drive for 24 hours if you received a sedative.  You may:  Feel sleepy, clumsy, or nauseous.  Feel forgetful about what happened after the procedure.  Have a sore throat if you had a breathing tube during the procedure.  Vomit. This information is not intended to replace advice given to you by your health care provider. Make sure you discuss any questions you have with your health care provider. Document Released: 01/13/2005 Document Revised: 09/26/2015 Document Reviewed: 08/10/2015 Elsevier Interactive Patient Education  2017 [place], Care After These instructions provide you with information about caring for  yourself after your procedure. Your health care provider may also give you more specific instructions. Your treatment has been planned according to current medical practices, but problems sometimes occur. Call your health care provider if you have any problems or questions after your procedure. What can I expect after the procedure? After your procedure, it is common to:  Feel sleepy for several hours.  Feel clumsy and have poor balance for several hours.  Feel forgetful about what happened after the procedure.  Have poor judgment for several hours.  Feel nauseous or vomit.  Have a sore throat if you had a breathing tube during the procedure. Follow these instructions at home: For at least 24 hours after the procedure:   Do not:  Participate in activities in which you could fall or become injured.  Drive.  Use heavy machinery.  Drink alcohol.  Take sleeping pills or medicines that cause drowsiness.  Make important decisions or sign legal documents.  Take care of children on your own.  Rest. Eating and drinking  Follow the diet that is recommended by your health care provider.  If you vomit, drink water, juice, or  soup when you can drink without vomiting.  Make sure you have little or no nausea before eating solid foods. General instructions  Have a responsible adult stay with you until you are awake and alert.  Take over-the-counter and prescription medicines only as told by your health care provider.  If you smoke, do not smoke without supervision.  Keep all follow-up visits as told by your health care provider. This is important. Contact a health care provider if:  You keep feeling nauseous or you keep vomiting.  You feel light-headed.  You develop a rash.  You have a fever. Get help right away if:  You have trouble breathing. This information is not intended to replace advice given to you by your health care provider. Make sure you discuss any  questions you have with your health care provider. Document Released: 08/10/2015 Document Revised: 12/10/2015 Document Reviewed: 08/10/2015 Elsevier Interactive Patient Education  2017 [employer].

## 2016-05-04 ENCOUNTER — Encounter (HOSPITAL_COMMUNITY)
Admission: RE | Admit: 2016-05-04 | Discharge: 2016-05-04 | Disposition: A | Discharge status: Home / Self Care | Payer: Medicare Other | Source: Ambulatory Visit | Attending: Cardiology | Admitting: Cardiology

## 2016-05-04 DIAGNOSIS — J45909 Unspecified asthma, uncomplicated: Secondary | ICD-10-CM | POA: Diagnosis not present

## 2016-05-04 DIAGNOSIS — R0602 Shortness of breath: Secondary | ICD-10-CM

## 2016-05-04 NOTE — Progress Notes (Signed)
Daily Session Note  Patient Details  Name: Lance [name] MRN: [id number] Date of Birth: 21-Nov-1945 Referring Provider:   Kennedy from 02/26/2016 in [place]  Referring Provider  Dr. Harl Bowie      Encounter Date: 05/04/2016  Check In:     Session Check In - 05/04/16 1104      Check-In   Location [facility]   Staff Present Diane Angelina Pih, MS, EP, [facility], Exercise Physiologist;Astra Gregg Luther Parody, BS, EP, Exercise Physiologist   Supervising physician immediately available to respond to emergencies See telemetry face sheet for immediately available MD   Medication changes reported     No   Fall or balance concerns reported    No   Warm-up and Cool-down Performed as group-led instruction   Resistance Training Performed Yes   VAD Patient? No     Pain Assessment   Currently in Pain? No/denies   Pain Score 0-No pain   Multiple Pain Sites No      Capillary Blood Glucose: No results found for this or any previous visit (from the past 24 hour(s)).   Goals Met:  Independence with exercise equipment Improved SOB with ADL's Using PLB without cueing & demonstrates good technique Exercise tolerated well No report of cardiac concerns or symptoms Strength training completed today  Goals Unmet:  Not Applicable  Comments: Check out 1145   Dr. Sinda Du is Medical Director for [facility].

## 2016-05-05 ENCOUNTER — Encounter (HOSPITAL_COMMUNITY): Payer: Self-pay

## 2016-05-05 ENCOUNTER — Encounter (HOSPITAL_COMMUNITY)
Admission: RE | Admit: 2016-05-05 | Discharge: 2016-05-05 | Disposition: A | Discharge status: Home / Self Care | Payer: Medicare Other | Source: Ambulatory Visit | Attending: Internal Medicine | Admitting: Internal Medicine

## 2016-05-05 DIAGNOSIS — I4891 Unspecified atrial fibrillation: Secondary | ICD-10-CM | POA: Diagnosis not present

## 2016-05-05 DIAGNOSIS — Z01812 Encounter for preprocedural laboratory examination: Secondary | ICD-10-CM | POA: Diagnosis not present

## 2016-05-05 DIAGNOSIS — I2510 Atherosclerotic heart disease of native coronary artery without angina pectoris: Secondary | ICD-10-CM | POA: Insufficient documentation

## 2016-05-05 LAB — CBC WITH DIFFERENTIAL/PLATELET
Basophils Absolute: 0 10*3/uL (ref 0.0–0.1)
Basophils Relative: 1 %
Eosinophils Absolute: 0.1 10*3/uL (ref 0.0–0.7)
Eosinophils Relative: 2 %
HCT: 41.1 % (ref 39.0–52.0)
Hemoglobin: 13.6 g/dL (ref 13.0–17.0)
Lymphocytes Relative: 34 %
Lymphs Abs: 1.9 10*3/uL (ref 0.7–4.0)
MCH: 28.3 pg (ref 26.0–34.0)
MCHC: 33.1 g/dL (ref 30.0–36.0)
MCV: 85.6 fL (ref 78.0–100.0)
Monocytes Absolute: 0.4 10*3/uL (ref 0.1–1.0)
Monocytes Relative: 8 %
Neutro Abs: 3.1 10*3/uL (ref 1.7–7.7)
Neutrophils Relative %: 55 %
Platelets: 162 10*3/uL (ref 150–400)
RBC: 4.8 MIL/uL (ref 4.22–5.81)
RDW: 14.6 % (ref 11.5–15.5)
WBC: 5.5 10*3/uL (ref 4.0–10.5)

## 2016-05-05 LAB — BASIC METABOLIC PANEL
Anion gap: 7 (ref 5–15)
BUN: 11 mg/dL (ref 6–20)
CO2: 26 mmol/L (ref 22–32)
Calcium: 8.6 mg/dL — ABNORMAL LOW (ref 8.9–10.3)
Chloride: 100 mmol/L — ABNORMAL LOW (ref 101–111)
Creatinine, Ser: 1.24 mg/dL (ref 0.61–1.24)
GFR calc Af Amer: >60 mL/min (ref >60)
GFR calc non Af Amer: 58 mL/min — ABNORMAL LOW (ref >60)
Glucose, Bld: 252 mg/dL — ABNORMAL HIGH (ref 65–99)
Potassium: 3.5 mmol/L (ref 3.5–5.1)
Sodium: 133 mmol/L — ABNORMAL LOW (ref 135–145)

## 2016-05-06 ENCOUNTER — Encounter (HOSPITAL_COMMUNITY): Payer: Medicare Other

## 2016-05-06 NOTE — Pre-Procedure Instructions (Signed)
Pt in for PAT. Has ICD. Dr Patsey Berthold notified of ICD. He does not want defibrillator turned off.

## 2016-05-07 LAB — CUP PACEART REMOTE DEVICE CHECK
Battery Remaining Longevity: 52 mo
Battery Remaining Percentage: 55 %
Brady Statistic RA Percent Paced: 1 % — CL
Brady Statistic RV Percent Paced: 1 % — CL
Date Time Interrogation Session: 20180105123235
HighPow Impedance: 68 Ohm
Implantable Lead Implant Date: 20050706
Implantable Lead Implant Date: 20161128
Implantable Lead Location: 753859
Implantable Lead Location: 753860
Implantable Lead Model: 7122
Implantable Pulse Generator Implant Date: 20130727
Lead Channel Impedance Value: 380 Ohm
Lead Channel Impedance Value: 540 Ohm
Lead Channel Sensing Intrinsic Amplitude: 11.3 mV
Lead Channel Sensing Intrinsic Amplitude: 2.9 mV
Pulse Gen Serial Number: 7041246

## 2016-05-08 DIAGNOSIS — E291 Testicular hypofunction: Secondary | ICD-10-CM | POA: Diagnosis not present

## 2016-05-10 ENCOUNTER — Encounter (HOSPITAL_COMMUNITY)
Admission: RE | Disposition: A | Discharge status: Home / Self Care | Payer: Self-pay | Source: Ambulatory Visit | Attending: Internal Medicine

## 2016-05-10 ENCOUNTER — Ambulatory Visit (HOSPITAL_COMMUNITY)
Admission: RE | Admit: 2016-05-10 | Discharge: 2016-05-10 | Disposition: A | Discharge status: Home / Self Care | Payer: Medicare Other | Source: Ambulatory Visit | Attending: Internal Medicine | Admitting: Internal Medicine

## 2016-05-10 ENCOUNTER — Encounter (HOSPITAL_COMMUNITY): Payer: Self-pay

## 2016-05-10 ENCOUNTER — Ambulatory Visit (HOSPITAL_COMMUNITY): Payer: Medicare Other | Admitting: Anesthesiology

## 2016-05-10 DIAGNOSIS — N529 Male erectile dysfunction, unspecified: Secondary | ICD-10-CM | POA: Insufficient documentation

## 2016-05-10 DIAGNOSIS — F329 Major depressive disorder, single episode, unspecified: Secondary | ICD-10-CM | POA: Insufficient documentation

## 2016-05-10 DIAGNOSIS — F419 Anxiety disorder, unspecified: Secondary | ICD-10-CM | POA: Diagnosis not present

## 2016-05-10 DIAGNOSIS — Z794 Long term (current) use of insulin: Secondary | ICD-10-CM | POA: Diagnosis not present

## 2016-05-10 DIAGNOSIS — D122 Benign neoplasm of ascending colon: Secondary | ICD-10-CM | POA: Diagnosis not present

## 2016-05-10 DIAGNOSIS — Z79899 Other long term (current) drug therapy: Secondary | ICD-10-CM | POA: Insufficient documentation

## 2016-05-10 DIAGNOSIS — Z7951 Long term (current) use of inhaled steroids: Secondary | ICD-10-CM | POA: Diagnosis not present

## 2016-05-10 DIAGNOSIS — I2510 Atherosclerotic heart disease of native coronary artery without angina pectoris: Secondary | ICD-10-CM | POA: Insufficient documentation

## 2016-05-10 DIAGNOSIS — R1310 Dysphagia, unspecified: Secondary | ICD-10-CM | POA: Diagnosis not present

## 2016-05-10 DIAGNOSIS — Z95810 Presence of automatic (implantable) cardiac defibrillator: Secondary | ICD-10-CM | POA: Insufficient documentation

## 2016-05-10 DIAGNOSIS — Z1211 Encounter for screening for malignant neoplasm of colon: Secondary | ICD-10-CM | POA: Diagnosis not present

## 2016-05-10 DIAGNOSIS — I4891 Unspecified atrial fibrillation: Secondary | ICD-10-CM | POA: Insufficient documentation

## 2016-05-10 DIAGNOSIS — I252 Old myocardial infarction: Secondary | ICD-10-CM | POA: Diagnosis not present

## 2016-05-10 DIAGNOSIS — K449 Diaphragmatic hernia without obstruction or gangrene: Secondary | ICD-10-CM | POA: Diagnosis not present

## 2016-05-10 DIAGNOSIS — Z7982 Long term (current) use of aspirin: Secondary | ICD-10-CM | POA: Diagnosis not present

## 2016-05-10 DIAGNOSIS — E1142 Type 2 diabetes mellitus with diabetic polyneuropathy: Secondary | ICD-10-CM | POA: Insufficient documentation

## 2016-05-10 DIAGNOSIS — K317 Polyp of stomach and duodenum: Secondary | ICD-10-CM | POA: Insufficient documentation

## 2016-05-10 DIAGNOSIS — K5140 Inflammatory polyps of colon without complications: Secondary | ICD-10-CM | POA: Diagnosis not present

## 2016-05-10 DIAGNOSIS — Z86010 Personal history of colonic polyps: Secondary | ICD-10-CM | POA: Insufficient documentation

## 2016-05-10 DIAGNOSIS — E785 Hyperlipidemia, unspecified: Secondary | ICD-10-CM | POA: Diagnosis not present

## 2016-05-10 DIAGNOSIS — J449 Chronic obstructive pulmonary disease, unspecified: Secondary | ICD-10-CM | POA: Diagnosis not present

## 2016-05-10 DIAGNOSIS — K219 Gastro-esophageal reflux disease without esophagitis: Secondary | ICD-10-CM | POA: Insufficient documentation

## 2016-05-10 DIAGNOSIS — Z8673 Personal history of transient ischemic attack (TIA), and cerebral infarction without residual deficits: Secondary | ICD-10-CM | POA: Insufficient documentation

## 2016-05-10 DIAGNOSIS — I110 Hypertensive heart disease with heart failure: Secondary | ICD-10-CM | POA: Diagnosis not present

## 2016-05-10 DIAGNOSIS — I509 Heart failure, unspecified: Secondary | ICD-10-CM | POA: Diagnosis not present

## 2016-05-10 DIAGNOSIS — Z951 Presence of aortocoronary bypass graft: Secondary | ICD-10-CM | POA: Insufficient documentation

## 2016-05-10 DIAGNOSIS — K635 Polyp of colon: Secondary | ICD-10-CM

## 2016-05-10 DIAGNOSIS — Z87891 Personal history of nicotine dependence: Secondary | ICD-10-CM | POA: Diagnosis not present

## 2016-05-10 DIAGNOSIS — Z96652 Presence of left artificial knee joint: Secondary | ICD-10-CM | POA: Insufficient documentation

## 2016-05-10 HISTORY — PX: POLYPECTOMY: SHX5525

## 2016-05-10 HISTORY — PX: MALONEY DILATION: SHX5535

## 2016-05-10 HISTORY — PX: ESOPHAGOGASTRODUODENOSCOPY (EGD) WITH PROPOFOL: SHX5813

## 2016-05-10 HISTORY — PX: COLONOSCOPY WITH PROPOFOL: SHX5780

## 2016-05-10 LAB — GLUCOSE, CAPILLARY: Glucose-Capillary: 226 mg/dL — ABNORMAL HIGH (ref 65–99)

## 2016-05-10 SURGERY — COLONOSCOPY WITH PROPOFOL
Anesthesia: Monitor Anesthesia Care

## 2016-05-10 MED ORDER — PROPOFOL 500 MG/50ML IV EMUL
INTRAVENOUS | Status: DC | PRN
  Administered 2016-05-10: 125 ug/kg/min via INTRAVENOUS
  Administered 2016-05-10: 10:00:00 via INTRAVENOUS

## 2016-05-10 MED ORDER — CHLORHEXIDINE GLUCONATE CLOTH 2 % EX PADS: 6.0000 | MEDICATED_PAD | Freq: Once | CUTANEOUS | Status: DC

## 2016-05-10 MED ORDER — LIDOCAINE VISCOUS 2 % MT SOLN
OROMUCOSAL | Status: AC
  Filled 2016-05-10: qty 15

## 2016-05-10 MED ORDER — FENTANYL CITRATE (PF) 100 MCG/2ML IJ SOLN
INTRAMUSCULAR | Status: AC
  Filled 2016-05-10: qty 2

## 2016-05-10 MED ORDER — MIDAZOLAM HCL 2 MG/2ML IJ SOLN
INTRAMUSCULAR | Status: AC
  Filled 2016-05-10: qty 2

## 2016-05-10 MED ORDER — MIDAZOLAM HCL 5 MG/5ML IJ SOLN
INTRAMUSCULAR | Status: DC | PRN
  Administered 2016-05-10: 2 mg via INTRAVENOUS

## 2016-05-10 MED ORDER — LIDOCAINE VISCOUS 2 % MT SOLN
15.0000 mL | Freq: Once | OROMUCOSAL | Status: AC
  Administered 2016-05-10: 15 mL via OROMUCOSAL

## 2016-05-10 MED ORDER — MIDAZOLAM HCL 2 MG/2ML IJ SOLN
1.0000 mg | INTRAMUSCULAR | Status: DC | PRN
  Administered 2016-05-10: 2 mg via INTRAVENOUS

## 2016-05-10 MED ORDER — LACTATED RINGERS IV SOLN
INTRAVENOUS | Status: DC
  Administered 2016-05-10: 09:00:00 via INTRAVENOUS

## 2016-05-10 MED ORDER — FENTANYL CITRATE (PF) 100 MCG/2ML IJ SOLN
25.0000 ug | INTRAMUSCULAR | Status: AC | PRN
  Administered 2016-05-10 (×2): 25 ug via INTRAVENOUS

## 2016-05-10 NOTE — Transfer of Care (Signed)
Immediate Anesthesia Transfer of Care Note  Patient: Lance [name]  Procedure(s) Performed: Procedure(s) with comments: COLONOSCOPY WITH PROPOFOL (N/A) - 12:00pm ESOPHAGOGASTRODUODENOSCOPY (EGD) WITH PROPOFOL (N/A) MALONEY DILATION (N/A) - 56/58 POLYPECTOMY - ascending colon polyp;  Patient Location: PACU  Anesthesia Type:MAC  Level of Consciousness: awake and patient cooperative  Airway & Oxygen Therapy: Patient Spontanous Breathing and Patient connected to face mask oxygen  Post-op Assessment: Report given to RN, Post -op Vital signs reviewed and stable and Patient moving all extremities  Post vital signs: Reviewed and stable  Last Vitals:  Vitals:   05/10/16 0905 05/10/16 0910  BP: 139/84   Pulse:    Resp: (!) 35 (!) 25  Temp:      Last Pain:  Vitals:   05/10/16 0922  TempSrc:   PainSc: 0-No pain      Patients Stated Pain Goal: 5 (73/53/29 [id number])  Complications: No apparent anesthesia complications

## 2016-05-10 NOTE — Anesthesia Postprocedure Evaluation (Signed)
Anesthesia Post Note  Patient: Lance [name]  Procedure(s) Performed: Procedure(s) (LRB): COLONOSCOPY WITH PROPOFOL (N/A) ESOPHAGOGASTRODUODENOSCOPY (EGD) WITH PROPOFOL (N/A) MALONEY DILATION (N/A) POLYPECTOMY  Patient location during evaluation: PACU Anesthesia Type: MAC Level of consciousness: awake and patient cooperative Pain management: pain level controlled Vital Signs Assessment: post-procedure vital signs reviewed and stable Respiratory status: spontaneous breathing, nonlabored ventilation and respiratory function stable Cardiovascular status: blood pressure returned to baseline Postop Assessment: no signs of nausea or vomiting Anesthetic complications: no     Last Vitals:  Vitals:   05/10/16 0905 05/10/16 0910  BP: 139/84   Pulse:    Resp: (!) 35 (!) 25  Temp:      Last Pain:  Vitals:   05/10/16 0922  TempSrc:   PainSc: 0-No pain                 Faithlyn Recktenwald J

## 2016-05-10 NOTE — Op Note (Signed)
[facility] Patient Name: Lance [name] Procedure Date: 05/10/2016 9:37 AM MRN: [id number] Date of Birth: February 04, 1946 Attending MD: Norvel Richards , MD CSN: [id number] Age: 71 Admit Type: Outpatient Procedure:                Colonoscopy with snare polypectomy Indications:              High risk colon cancer surveillance: Personal                            history of colonic polyps Providers:                Norvel Richards, MD, Lurline Del, RN, Aram Candela, Randa Spike, Technician Referring MD:              Medicines:                Propofol per Anesthesia Complications:            No immediate complications. Estimated Blood Loss:     Estimated blood loss: none. Procedure:                Pre-Anesthesia Assessment:                           - Prior to the procedure, a History and Physical                            was performed, and patient medications and                            allergies were reviewed. The patient's tolerance of                            previous anesthesia was also reviewed. The risks                            and benefits of the procedure and the sedation                            options and risks were discussed with the patient.                            All questions were answered, and informed consent                            was obtained. Prior Anticoagulants: The patient                            last took Plavix (clopidogrel) on the day of the                            procedure. ASA Grade Assessment: III - A patient  with severe systemic disease. After reviewing the                            risks and benefits, the patient was deemed in                            satisfactory condition to undergo the procedure.                           After obtaining informed consent, the colonoscope                            was passed under direct vision. Throughout the            procedure, the patient's blood pressure, pulse, and                            oxygen saturations were monitored continuously. The                            Colonoscope was introduced through the and advanced                            to the the cecum, identified by appendiceal orifice                            and ileocecal valve. The colonoscopy was performed                            without difficulty. The patient tolerated the                            procedure well. The quality of the bowel                            preparation was adequate. The ileocecal valve,                            appendiceal orifice, and rectum were photographed. Scope In: 9:42:16 AM Scope Out: 10:02:48 AM Scope Withdrawal Time: 0 hours 16 minutes 3 seconds  Total Procedure Duration: 0 hours 20 minutes 32 seconds  Findings:      The perianal and digital rectal examinations were normal.      A 6 mm polyp was found in the ascending colon. The polyp was       semi-pedunculated. The polyp was removed with a hot snare. Resection and       retrieval were complete. Estimated blood loss: none. Impression:               - One 6 mm polyp in the ascending colon, removed                            with a hot snare. Resected and retrieved. Moderate Sedation:      Moderate (conscious) sedation was personally administered by an       anesthesia professional.  The following parameters were monitored: oxygen       saturation, heart rate, blood pressure, respiratory rate, EKG, adequacy       of pulmonary ventilation, and response to care. Total physician       intraservice time was 34 minutes. Recommendation:           - Repeat colonoscopy date to be determined after                            pending pathology results are reviewed for                            surveillance based on pathology results.                           - Return to GI office in 12 weeks.                           - Patient has a  contact number available for                            emergencies. The signs and symptoms of potential                            delayed complications were discussed with the                            patient. Return to normal activities tomorrow.                            Written discharge instructions were provided to the                            patient. Procedure Code(s):        --- Professional ---                           [phone], Colonoscopy, flexible; with removal of                            tumor(s), polyp(s), or other lesion(s) by snare                            technique Diagnosis Code(s):        --- Professional ---                           Z86.010, Personal history of colonic polyps                           D12.2, Benign neoplasm of ascending colon CPT copyright 2016 American Medical Association. All rights reserved. The codes documented in this report are preliminary and upon coder review may  be revised to meet current compliance requirements. Cristopher Estimable. Angalina Ante, MD Norvel Richards, MD 05/10/2016 10:12:51 AM This report has been signed electronically. Number of Addenda: 0
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
## 2016-05-10 NOTE — Interval H&P Note (Signed)
History and Physical Interval Note:  05/10/2016 9:00 AM  [name]  has presented today for surgery, with the diagnosis of dysphagia, colon polyps  The various methods of treatment have been discussed with the patient and family. After consideration of risks, benefits and other options for treatment, the patient has consented to  Procedure(s) with comments: COLONOSCOPY WITH PROPOFOL (N/A) - 12:00pm ESOPHAGOGASTRODUODENOSCOPY (EGD) WITH PROPOFOL (N/A) MALONEY DILATION (N/A) as a surgical intervention .  The patient's history has been reviewed, patient examined, no change in status, stable for surgery.  I have reviewed the patient's chart and labs.  Questions were answered to the patient's satisfaction.     Manus Rudd  Patient seen and examined. No change. ICD in place. We'll proceed with EGD/ED and TCS. Discussed with Dr. Patsey Berthold. Will not manipulate ICD if cautery utilized after discussion with Dr. Patsey Berthold.  The risks, benefits, limitations, imponderables and alternatives regarding both EGD and colonoscopy have been reviewed with the patient. Questions have been answered. All parties agreeable.

## 2016-05-10 NOTE — Interval H&P Note (Signed)
History and Physical Interval Note:  05/10/2016 9:19 AM  Lance [name]  has presented today for surgery, with the diagnosis of dysphagia, colon polyps  The various methods of treatment have been discussed with the patient and family. After consideration of risks, benefits and other options for treatment, the patient has consented to  Procedure(s) with comments: COLONOSCOPY WITH PROPOFOL (N/A) - 12:00pm ESOPHAGOGASTRODUODENOSCOPY (EGD) WITH PROPOFOL (N/A) MALONEY DILATION (N/A) as a surgical intervention .  The patient's history has been reviewed, patient examined, no change in status, stable for surgery.  I have reviewed the patient's chart and labs.  Questions were answered to the patient's satisfaction.     Manus Rudd  EGD/ED and colonoscopy per plan. Discussed with Dr. Patsey Berthold. Will not manipulate ICD even if cautery is used.  Patient remains on Plavix per plan.  The risks, benefits, limitations, imponderables and alternatives regarding both EGD and colonoscopy have been reviewed with the patient. Questions have been answered. All parties agreeable.

## 2016-05-10 NOTE — Anesthesia Preprocedure Evaluation (Signed)
Anesthesia Evaluation  Patient identified by MRN, date of birth, ID band Patient awake    Reviewed: Allergy & Precautions, H&P , NPO status , Patient's Chart, lab work & pertinent test results, reviewed documented beta blocker date and time   Airway Mallampati: II  TM Distance: <3 FB     Dental  (+) Poor Dentition, Dental Advisory Given, Edentulous Upper   Pulmonary shortness of breath and with exertion, asthma , COPD, former smoker,    breath sounds clear to auscultation       Cardiovascular hypertension, Pt. on medications + CAD, + Past MI, + CABG, +CHF and + DOE  + Cardiac Defibrillator  Rhythm:Regular Rate:Normal     Neuro/Psych PSYCHIATRIC DISORDERS Anxiety Depression TIA Neuromuscular disease CVA    GI/Hepatic GERD  Medicated and Controlled,  Endo/Other  diabetes, Poorly Controlled, Type 1, Insulin DependentPITUITARY INSUFFICIENCY  Renal/GU      Musculoskeletal   Abdominal   Peds  Hematology   Anesthesia Other Findings   Reproductive/Obstetrics                             Anesthesia Physical Anesthesia Plan  ASA: IV  Anesthesia Plan: MAC   Post-op Pain Management:    Induction: Intravenous  Airway Management Planned: Simple Face Mask  Additional Equipment:   Intra-op Plan:   Post-operative Plan:   Informed Consent: I have reviewed the patients History and Physical, chart, labs and discussed the procedure including the risks, benefits and alternatives for the proposed anesthesia with the patient or authorized representative who has indicated his/her understanding and acceptance.     Plan Discussed with:   Anesthesia Plan Comments:         Anesthesia Quick Evaluation

## 2016-05-10 NOTE — Discharge Instructions (Signed)
Colon Polyps Introduction Polyps are tissue growths inside the body. Polyps can grow in many places, including the large intestine (colon). A polyp may be a round bump or a mushroom-shaped growth. You could have one polyp or several. Most colon polyps are noncancerous (benign). However, some colon polyps can become cancerous over time. What are the causes? The exact cause of colon polyps is not known. What increases the risk? This condition is more likely to develop in people who:  Have a family history of colon cancer or colon polyps.  Are older than 68 or older than 45 if they are African American.  Have inflammatory bowel disease, such as ulcerative colitis or Crohn disease.  Are overweight.  Smoke cigarettes.  Do not get enough exercise.  Drink too much alcohol.  Eat a diet that is:  High in fat and red meat.  Low in fiber.  Had childhood cancer that was treated with abdominal radiation. What are the signs or symptoms? Most polyps do not cause symptoms. If you have symptoms, they may include:  Blood coming from your rectum when having a bowel movement.  Blood in your stool.The stool may look dark red or black.  A change in bowel habits, such as constipation or diarrhea. How is this diagnosed? This condition is diagnosed with a colonoscopy. This is a procedure that uses a lighted, flexible scope to look at the inside of your colon. How is this treated? Treatment for this condition involves removing any polyps that are found. Those polyps will then be tested for cancer. If cancer is found, your health care provider will talk to you about options for colon cancer treatment. Follow these instructions at home: Diet  Eat plenty of fiber, such as fruits, vegetables, and whole grains.  Eat foods that are high in calcium and vitamin D, such as milk, cheese, yogurt, eggs, liver, fish, and broccoli.  Limit foods high in fat, red meats, and processed meats, such as hot dogs,  sausage, bacon, and lunch meats.  Maintain a healthy weight, or lose weight if recommended by your health care provider. General instructions  Do not smoke cigarettes.  Do not drink alcohol excessively.  Keep all follow-up visits as told by your health care provider. This is important. This includes keeping regularly scheduled colonoscopies. Talk to your health care provider about when you need a colonoscopy.  Exercise every day or as told by your health care provider. Contact a health care provider if:  You have new or worsening bleeding during a bowel movement.  You have new or increased blood in your stool.  You have a change in bowel habits.  You unexpectedly lose weight. This information is not intended to replace advice given to you by your health care provider. Make sure you discuss any questions you have with your health care provider. Document Released: 01/14/2004 Document Revised: 09/25/2015 Document Reviewed: 03/10/2015  2017 [employer]vier  Colonoscopy Discharge Instructions  Read the instructions outlined below and refer to this sheet in the next few weeks. These discharge instructions provide you with general information on caring for yourself after you leave the hospital. Your doctor may also give you specific instructions. While your treatment has been planned according to the most current medical practices available, unavoidable complications occasionally occur. If you have any problems or questions after discharge, call Dr. Gala Romney at [phone]. ACTIVITY  You may resume your regular activity, but move at a slower pace for the next 24 hours.   Take frequent rest  periods for the next 24 hours.   Walking will help get rid of the air and reduce the bloated feeling in your belly (abdomen).   No driving for 24 hours (because of the medicine (anesthesia) used during the test).    Do not sign any important legal documents or operate any machinery for 24 hours (because of the  anesthesia used during the test).  NUTRITION  Drink plenty of fluids.   You may resume your normal diet as instructed by your doctor.   Begin with a light meal and progress to your normal diet. Heavy or fried foods are harder to digest and may make you feel sick to your stomach (nauseated).   Avoid alcoholic beverages for 24 hours or as instructed.  MEDICATIONS  You may resume your normal medications unless your doctor tells you otherwise.  WHAT YOU CAN EXPECT TODAY  Some feelings of bloating in the abdomen.   Passage of more gas than usual.   Spotting of blood in your stool or on the toilet paper.  IF YOU HAD POLYPS REMOVED DURING THE COLONOSCOPY:  No aspirin products for 7 days or as instructed.   No alcohol for 7 days or as instructed.   Eat a soft diet for the next 24 hours.  FINDING OUT THE RESULTS OF YOUR TEST Not all test results are available during your visit. If your test results are not back during the visit, make an appointment with your caregiver to find out the results. Do not assume everything is normal if you have not heard from your caregiver or the medical facility. It is important for you to follow up on all of your test results.  SEEK IMMEDIATE MEDICAL ATTENTION IF:  You have more than a spotting of blood in your stool.   Your belly is swollen (abdominal distention).   You are nauseated or vomiting.   You have a temperature over 101.   You have abdominal pain or discomfort that is severe or gets worse throughout the day.    Colon polyp information provided  Further recommendations to follow pending review of pathology report

## 2016-05-10 NOTE — Op Note (Signed)
[facility] Patient Name: Lance [name] Procedure Date: 05/10/2016 8:57 AM MRN: [id number] Date of Birth: Sep 27, 1945 Attending MD: Norvel Richards , MD CSN: [id number] Age: 71 Admit Type: Outpatient Procedure:                Upper GI endoscopy with Venia Minks dilation Indications:              Dysphagia Providers:                Norvel Richards, MD, Lurline Del, RN, Aram Candela, Randa Spike, Technician Referring MD:              Medicines:                Propofol per Anesthesia Complications:            No immediate complications. Estimated Blood Loss:     Estimated blood loss: none. Procedure:                Pre-Anesthesia Assessment:                           - Prior to the procedure, a History and Physical                            was performed, and patient medications and                            allergies were reviewed. The patient's tolerance of                            previous anesthesia was also reviewed. The risks                            and benefits of the procedure and the sedation                            options and risks were discussed with the patient.                            All questions were answered, and informed consent                            was obtained. Prior Anticoagulants: The patient                            last took Plavix (clopidogrel) on the day of the                            procedure. ASA Grade Assessment: III - A patient                            with severe systemic disease. After reviewing the  risks and benefits, the patient was deemed in                            satisfactory condition to undergo the procedure.                           After obtaining informed consent, the endoscope was                            passed under direct vision. Throughout the                            procedure, the patient's blood pressure, pulse, and      oxygen saturations were monitored continuously. The                            [id number]) scope was introduced through the                            and advanced to the second part of duodenum. The                            upper GI endoscopy was accomplished without                            difficulty. The patient tolerated the procedure                            well. The patient tolerated the procedure well. Scope In: 9:30:27 AM Scope Out: 9:35:39 AM Total Procedure Duration: 0 hours 5 minutes 12 seconds  Findings:      The examined esophagus was normal.      A small hiatal hernia was present. Scattered hyperplastic appearing       polyps (previously biopsied)      The duodenal bulb and second portion of the duodenum were normal. The       scope was withdrawn. Dilation was performed with a Maloney dilator with       no resistance at 56 Fr. The dilation site was examined following       endoscope reinsertion and showed no change. The scope was withdrawn.       Dilation was performed with a Maloney dilator with no resistance at 42       Fr. The dilation site was examined following endoscope reinsertion and       showed no change. Estimated blood loss: none. Impression:               - Normal esophagus. Dilated.                           - Small hiatal hernia. Gastric polyps                           - Normal duodenal bulb and second portion of the  duodenum. Moderate Sedation:      Moderate (conscious) sedation was personally administered by an       anesthesia professional. The following parameters were monitored: oxygen       saturation, heart rate, blood pressure, respiratory rate, EKG, adequacy       of pulmonary ventilation, and response to care. Total physician       intraservice time was 14 minutes. Recommendation:           - Patient has a contact number available for                            emergencies. The signs and symptoms of  potential                            delayed complications were discussed with the                            patient. Return to normal activities tomorrow.                            Written discharge instructions were provided to the                            patient.                           - Resume previous diet.                           See colonoscopy report                           - Continue present medications.                           - No repeat upper endoscopy.                           - Return to GI clinic in 12 weeks. Procedure Code(s):        --- Professional ---                           [phone], Esophagogastroduodenoscopy, flexible,                            transoral; diagnostic, including collection of                            specimen(s) by brushing or washing, when performed                            (separate procedure)                           [id number], Dilation of esophagus, by unguided sound or  bougie, single or multiple passes Diagnosis Code(s):        --- Professional ---                           K44.9, Diaphragmatic hernia without obstruction or                            gangrene                           R13.10, Dysphagia, unspecified CPT copyright 2016 [employer]. All rights reserved. The codes documented in this report are preliminary and upon coder review may  be revised to meet current compliance requirements. Cristopher Estimable. Eliani Leclere, MD Norvel Richards, MD 05/10/2016 10:08:57 AM This report has been signed electronically. Number of Addenda: 0

## 2016-05-11 ENCOUNTER — Encounter: Payer: Self-pay | Admitting: Family Medicine

## 2016-05-11 ENCOUNTER — Ambulatory Visit (INDEPENDENT_AMBULATORY_CARE_PROVIDER_SITE_OTHER): Payer: Medicare Other | Admitting: Family Medicine

## 2016-05-11 ENCOUNTER — Encounter (HOSPITAL_COMMUNITY)
Admission: RE | Admit: 2016-05-11 | Discharge: 2016-05-11 | Disposition: A | Discharge status: Home / Self Care | Payer: Medicare Other | Source: Ambulatory Visit | Attending: Cardiology | Admitting: Cardiology

## 2016-05-11 VITALS — BP 132/84 | Temp 98.8°F | Ht 69.0 in | Wt 237.5 lb

## 2016-05-11 DIAGNOSIS — J441 Chronic obstructive pulmonary disease with (acute) exacerbation: Secondary | ICD-10-CM | POA: Diagnosis not present

## 2016-05-11 DIAGNOSIS — J418 Mixed simple and mucopurulent chronic bronchitis: Secondary | ICD-10-CM | POA: Diagnosis not present

## 2016-05-11 MED ORDER — SULFAMETHOXAZOLE-TRIMETHOPRIM 800-160 MG PO TABS: 1.0000 | ORAL_TABLET | Freq: Two times a day (BID) | ORAL | 0 refills | Status: DC

## 2016-05-11 MED ORDER — BUDESONIDE-FORMOTEROL FUMARATE 160-4.5 MCG/ACT IN AERO: 2.0000 | INHALATION_SPRAY | Freq: Two times a day (BID) | RESPIRATORY_TRACT | 3 refills | Status: DC

## 2016-05-11 NOTE — Progress Notes (Signed)
   Subjective:    Patient ID: Lance [name], male    DOB: 03/19/1946, 71 y.o.   MRN: [id number]  Cough  This is a new problem. The current episode started more than 1 month ago. The problem has been unchanged. The cough is productive of sputum. Associated symptoms include shortness of breath and wheezing. Associated symptoms comments: Congestion, eyes puffy . Nothing aggravates the symptoms. Treatments tried: antibiotics. The treatment provided no relief.   Patient has had a home and her function tests in the past which showed some element of COPD does have some Smoke exposure history  Review of Systems  Respiratory: Positive for cough, shortness of breath and wheezing.        Objective:   Physical Exam Patient does not appear toxic Lungs clear upper airway chest congestion noted heart regular not respiratory distress HEENT benign  Patient had a reoccurring chest congestion coughing with some for limb it is had clear with a little bit of yellow no sinus infection symptoms    Assessment & Plan:  I believe there is a element of chronic bronchitis as well as COPD I recommend Symbicort albuterol cannot do prednisone taper because of patient being a diabetic  There is some secondary bronchitis I recommend antibiotics for this Also allergic rhinitis he is already using nasal sprays follow-up when necessary

## 2016-05-12 ENCOUNTER — Encounter (HOSPITAL_COMMUNITY): Payer: Self-pay | Admitting: Internal Medicine

## 2016-05-12 NOTE — Progress Notes (Signed)
Pulmonary Individual Treatment Plan  Patient Details  Name: Lance [name] MRN: [id number] Date of Birth: Dec 23, 1945 Referring Provider:   Flowsheet Row PULMONARY REHAB OTHER RESP ORIENTATION from 02/26/2016 in [place]  Referring Provider  Dr. Harl Bowie      Initial Encounter Date:  Flowsheet Row PULMONARY REHAB OTHER RESP ORIENTATION from 02/26/2016 in [place]  Date  02/26/16  Referring Provider  Dr. Harl Bowie      Visit Diagnosis: SOB (shortness of breath)  Patient's Home Medications on Admission:   Current Outpatient Prescriptions:  .  albuterol (PROVENTIL HFA;VENTOLIN HFA) 108 (90 BASE) MCG/ACT inhaler, Inhale 2 puffs into the lungs every 6 (six) hours as needed for wheezing., Disp: 1 Inhaler, Rfl: 2 .  albuterol (PROVENTIL) (2.5 MG/3ML) 0.083% nebulizer solution, Take 3 mLs (2.5 mg total) by nebulization every 6 (six) hours as needed for wheezing or shortness of breath., Disp: 150 mL, Rfl: 0 .  ALPRAZolam (XANAX) 1 MG tablet, TAKE 1/2-1 TABLET 2 TIMES A DAY AS NEEDED (Patient taking differently: TAKE 1/2-1 TABLET (0.5MG  TO 1 MG) 2 TIMES A DAY AS NEEDED FOR ANXIETY), Disp: 60 tablet, Rfl: 3 .  aspirin EC 81 MG tablet, Take 81 mg by mouth daily., Disp: , Rfl:  .  atorvastatin (LIPITOR) 80 MG tablet, Take 80 mg by mouth daily. , Disp: , Rfl:  .  budesonide-formoterol (SYMBICORT) 160-4.5 MCG/ACT inhaler, Inhale 2 puffs into the lungs 2 (two) times daily., Disp: 1 Inhaler, Rfl: 3 .  clopidogrel (PLAVIX) 75 MG tablet, TAKE ONE TABLET EVERY MORNING WITH BREAKFAST, Disp: 30 tablet, Rfl: 5 .  dicyclomine (BENTYL) 10 MG capsule, TAKE ONE CAPSULE EVERY MORNING AND ONE CAPSULE AT BEDTIME AS NEEDED FOR INCREASED STOOLS AND BLOATING (Patient taking differently: Take 10 mg by mouth 2 (two) times daily as needed (for IBS (diarrhea issues)). ), Disp: 60 capsule, Rfl: 5 .  donepezil (ARICEPT) 10 MG tablet, Take 1 tablet (10 mg total) by mouth at  bedtime., Disp: 90 tablet, Rfl: 3 .  DULoxetine (CYMBALTA) 60 MG capsule, TAKE ONE (1) CAPSULE EACH DAY, Disp: 30 capsule, Rfl: 5 .  empagliflozin (JARDIANCE) 25 MG TABS tablet, Take 25 mg by mouth daily., Disp: , Rfl:  .  fexofenadine (ALLEGRA) 180 MG tablet, Take 180 mg by mouth daily as needed for allergies. , Disp: , Rfl:  .  furosemide (LASIX) 20 MG tablet, Take 1 tablet (20 mg total) by mouth daily., Disp: 90 tablet, Rfl: 3 .  gabapentin (NEURONTIN) 300 MG capsule, TAKE ONE TO TWO CAPSULES BY MOUTH THREE TIMES AS DAY AS NEEDED AS NEEDED (Patient taking differently: TAKE ONE CAPSULE (300 MG) EVERY MORNING & TWO CAPSULES (600 MG ) AT BEDTIME), Disp: 180 capsule, Rfl: 6 .  glucose blood (ONE TOUCH ULTRA TEST) test strip, Use as instructed 4 times daily. E11.65, Disp: 150 each, Rfl: 1 .  guaiFENesin (MUCINEX) 600 MG 12 hr tablet, Take 1,200 mg by mouth 2 (two) times daily. , Disp: , Rfl:  .  HUMALOG KWIKPEN 100 UNIT/ML KiwkPen, Inject 0.32-0.38 mLs (32-38 Units total) into the skin 3 (three) times daily. (Patient taking differently: Inject 22-26 Units into the skin 3 (three) times daily before meals. ), Disp: 30 pen, Rfl: 2 .  HYDROcodone-acetaminophen (LORTAB) 7.5-500 MG tablet, Take 1 tablet by mouth every 6 (six) hours as needed for pain., Disp: , Rfl:  .  Insulin Detemir (LEVEMIR FLEXTOUCH) 100 UNIT/ML Pen, Inject 90 Units into the skin daily at 10  pm. (Patient taking differently: Inject 100 Units into the skin at bedtime. ), Disp: 30 pen, Rfl: 2 .  losartan (COZAAR) 25 MG tablet, Take 1 tablet (25 mg total) by mouth daily., Disp: 30 tablet, Rfl: 11 .  magnesium oxide (MAG-OX) 400 MG tablet, TAKE ONE TABLET ONCE DAILY, Disp: 30 tablet, Rfl: 11 .  metoprolol tartrate (LOPRESSOR) 25 MG tablet, Take 1 tablet (25 mg total) by mouth 2 (two) times daily., Disp: 60 tablet, Rfl: 11 .  nitroGLYCERIN (NITROSTAT) 0.4 MG SL tablet, Place 1 tablet (0.4 mg total) under the tongue every 5 (five) minutes as  needed. For chest pain (Patient taking differently: Place 0.4 mg under the tongue every 5 (five) minutes as needed. For chest pain), Disp: 25 tablet, Rfl: 3 .  olopatadine (PATANOL) 0.1 % ophthalmic solution, Place 1 drop into both eyes 2 (two) times daily. (Patient taking differently: Place 1 drop into both eyes 2 (two) times daily as needed (for itchy/irritated eyes). ), Disp: 5 mL, Rfl: 12 .  pantoprazole (PROTONIX) 40 MG tablet, Take 1 tablet (40 mg total) by mouth 2 (two) times daily., Disp: 60 tablet, Rfl: 5 .  polyethylene glycol-electrolytes (TRILYTE) 420 g solution, Take 4,000 mLs by mouth as directed., Disp: 4000 mL, Rfl: 0 .  Probiotic Product (ALIGN PO), Take 1 tablet by mouth daily. , Disp: , Rfl:  .  sildenafil (REVATIO) 20 MG tablet, Take 3 -5 tabs as needed for erectile dysfunction, Disp: 20 tablet, Rfl: 4 .  sulfamethoxazole-trimethoprim (BACTRIM DS,SEPTRA DS) 800-160 MG tablet, Take 1 tablet by mouth 2 (two) times daily., Disp: 20 tablet, Rfl: 0 .  traZODone (DESYREL) 100 MG tablet, TAKE 1/2 TO 1 TABLET AT BEDTIME AS NEEDED FOR SLEEP (Patient taking differently: TAKE 1 TABLET (100 MG) BY MOUTH AT BEDTIME), Disp: 30 tablet, Rfl: 5 .  UNIFINE PENTIPS 31G X 6 MM MISC, USE PEN NEEDLES 4 TIMES A DAY, Disp: 150 each, Rfl: 0 .  vitamin B-12 (CYANOCOBALAMIN) 1000 MCG tablet, Take 1,000 mcg by mouth daily. Reported on 08/25/2015, Disp: , Rfl:  .  Vitamin D, Ergocalciferol, (DRISDOL) 50000 units CAPS capsule, TAKE ONE CAPSULE BY MOUTH ONCE A WEEK (Patient taking differently: TAKE ONE CAPSULE BY MOUTH ONCE A MONTH -3RD OF MONTH), Disp: 12 capsule, Rfl: 0  Past Medical History: Past Medical History:  Diagnosis Date  . Allergic rhinitis, cause unspecified   . Anxiety and depression   . ASCVD (arteriosclerotic cardiovascular disease)    70% mid left anterior descending lesion on cath in 06/1995; left anterior desending DES placed in 8/03 and RCA stent in 9/03; captain 3/05 revealed 90% second  marginal for which PCI was performed, 70% PDA and a total obstruction of the first diagonal and marginal; sudden cardiac death in [place] in 20-Nov-2003 for which automatic implantable cardiac defibrillator placed; negativ stress nuclear 10/07  . Atrial fibrillation ([place])   . Benign prostatic hypertrophy   . C. difficile diarrhea   . CAD (coronary artery disease) 1997  . CHF (congestive heart failure) ([place])   . COPD (chronic obstructive pulmonary disease) ([place])   . Coronary artery disease   . CVD (cerebrovascular disease) 05/2008   Transient ischemic attack; carotid ultrasound-plaque without focal disease  . Degenerative joint disease 2002   C-spine fusion   . Diabetes mellitus    Insulin requirement  . Diabetes mellitus without mention of complication   . Diabetic peripheral neuropathy ([place]) 09/06/2014  . Erectile dysfunction   . Esophageal reflux   .  HOH (hard of hearing)   . Hx-TIA (transient ischemic attack) 2010  . Hyperlipidemia   . Hypertension   . ICD (implantable cardiac defibrillator) in place 2005   [place] ICD, for SCD  . Memory deficits 09/05/2013  . Myocardial infarction   . Other testicular hypofunction   . Pacemaker   . S/P endoscopy Dec 2011   RMR: nl esophagus, hyperplastic polyp, active gastritis, no H.pylori.   . Shortness of breath   . Stroke ([place])   . Tobacco abuse    100 pack/year comsuption; cigarettes discontinued 2003; all tobacco products in 2008  . Tubular adenoma   . Vitreous floaters of left eye     Tobacco Use: History  Smoking Status  . Former Smoker  . Packs/day: 3.00  . Years: 40.00  . Types: Cigarettes  . Start date: 05/03/1954  . Quit date: 05/03/1998  Smokeless Tobacco  . Current User  . Types: Chew    Labs: Recent Review Flowsheet Data    Labs for ITP Cardiac and Pulmonary Rehab Latest Ref Rng & Units 07/25/2013 11/06/2014 02/06/2015 05/15/2015 11/21/2015   Cholestrol 125 - 200 mg/dL 154 - - - 125   LDLCALC <130 mg/dL 92 - - - 55   HDL  >=40 mg/dL 31(L) - - - 34(L)   Trlycerides <150 mg/dL 155(H) - - - 178(H)   Hemoglobin A1c <5.7 % - 10.2(A) 9.4(A) 9.9(H) 9.0(H)   PHART 7.350 - 7.450 - - - - -   PCO2ART 35.0 - 45.0 mmHg - - - - -   HCO3 20.0 - 24.0 mEq/L - - - - -   TCO2 0 - 100 mmol/L - - - - -   ACIDBASEDEF 0.0 - 2.0 mmol/L - - - - -   O2SAT % - - - - -      Capillary Blood Glucose: Lab Results  Component Value Date   GLUCAP 226 (H) 05/10/2016   GLUCAP 166 (H) 07/09/2015   GLUCAP 287 (H) 07/08/2015   GLUCAP 222 (H) 07/08/2015   GLUCAP 118 (H) 07/08/2015     ADL UCSD:     Pulmonary Assessment Scores    Row Name 02/26/16 1340         ADL UCSD   ADL Phase Entry     SOB Score total 114     Rest 2     Walk 6     Stairs 4     Bath 2     Dress 2     Shop 3       CAT Score   CAT Score 26       mMRC Score   mMRC Score 4        Pulmonary Function Assessment:     Pulmonary Function Assessment - 02/26/16 1337      Pulmonary Function Tests   FVC% 87 %   FEV1% 88 %   FEV1/FVC Ratio 101   RV% 111 %   DLCO% 92 %     Initial Spirometry Results   FVC% 87 %   FEV1% 88 %   FEV1/FVC Ratio 101     Post Bronchodilator Spirometry Results   FVC% 88 %   FEV1% 94 %   FEV1/FVC Ratio 106     Breath   Shortness of Breath Yes;Limiting activity      Exercise Target Goals:    Exercise Program Goal: Individual exercise prescription set with THRR, safety & activity barriers. Participant demonstrates ability to understand  and report RPE using BORG scale, to self-measure pulse accurately, and to acknowledge the importance of the exercise prescription.  Exercise Prescription Goal: Starting with aerobic activity 30 plus minutes a day, 3 days per week for initial exercise prescription. Provide home exercise prescription and guidelines that participant acknowledges understanding prior to discharge.  Activity Barriers & Risk Stratification:     Activity Barriers & Cardiac Risk Stratification -  02/26/16 1141      Activity Barriers & Cardiac Risk Stratification   Activity Barriers Back Problems;Left Knee Replacement;Joint Problems   Cardiac Risk Stratification High      6 Minute Walk:     6 Minute Walk    Row Name 02/26/16 1134         6 Minute Walk   Phase Initial     Distance 950 feet     Distance % Change 0 %     Walk Time 6 minutes     # of Rest Breaks 0     MPH 1.79     METS 2.37     RPE 15     Perceived Dyspnea  14     VO2 Peak 6.7     Symptoms No     Resting HR 69 bpm     Resting BP 130/72     Max Ex. HR 82 bpm     Max Ex. BP 150/76     2 Minute Post BP 138/74        Initial Exercise Prescription:     Initial Exercise Prescription - 02/26/16 1000      Date of Initial Exercise RX and Referring Provider   Date 02/26/16   Referring Provider Dr. Harl Bowie     Treadmill   MPH 1   Grade 0   Minutes 15   METs 1.6     Arm Ergometer   Level 2   Watts 15   Minutes 20   METs 1.9     Prescription Details   Frequency (times per week) 3   Duration Progress to 30 minutes of continuous aerobic without signs/symptoms of physical distress     Intensity   THRR REST +  20   THRR 40-80% of Max Heartrate [phone]   Ratings of Perceived Exertion 11-13   Perceived Dyspnea 0-4     Progression   Progression Continue progressive overload as per policy without signs/symptoms or physical distress.     Resistance Training   Training Prescription Yes   Weight 1   Reps 10-12      Perform Capillary Blood Glucose checks as needed.  Exercise Prescription Changes:      Exercise Prescription Changes    Row Name 03/09/16 1200 04/08/16 1500 05/10/16 0800         Exercise Review   Progression Yes Yes Yes       Response to Exercise   Blood Pressure (Admit) 128/62 156/80 124/68     Blood Pressure (Exercise) 130/68 162/72 154/72     Blood Pressure (Exit) 146/70 140/78 120/60     Heart Rate (Admit) 70 bpm 74 bpm 62 bpm     Heart Rate (Exercise) 87  bpm 94 bpm 80 bpm     Heart Rate (Exit) 74 bpm 83 bpm 74 bpm     Oxygen Saturation (Admit) 96 % 95 % 95 %     Oxygen Saturation (Exercise) 96 % 94 % 90 %     Oxygen Saturation (Exit) 94 % 97 % 97 %  Rating of Perceived Exertion (Exercise) 9 8 9      Perceived Dyspnea (Exercise) 9 11 11      Duration Progress to 30 minutes of continuous aerobic without signs/symptoms of physical distress Progress to 30 minutes of continuous aerobic without signs/symptoms of physical distress Progress to 30 minutes of continuous aerobic without signs/symptoms of physical distress     Intensity Rest + 20 Rest + 20 Rest + 20       Progression   Progression Continue progressive overload as per policy without signs/symptoms or physical distress. Continue progressive overload as per policy without signs/symptoms or physical distress. Continue progressive overload as per policy without signs/symptoms or physical distress.       Resistance Training   Training Prescription Yes Yes Yes     Weight 3 4 5      Reps 10-12 10-12 10-12       Treadmill   MPH 1.6 1.8 1.9     Grade 0 0 0     Minutes 15 15 15      METs 2.2 2.3 2.4       Arm Ergometer   Level 2.6 2.8 2.9     Watts 8 30 30      Minutes 20 20 20      METs 2.6 3.2 3.1       Home Exercise Plan   Plans to continue exercise at [facility]     Frequency Add 2 additional days to program exercise sessions. Add 2 additional days to program exercise sessions. Add 2 additional days to program exercise sessions.        Exercise Comments:      Exercise Comments    Row Name 03/09/16 1223 04/08/16 1510 05/10/16 0849       Exercise Comments Patient is proggressing well. Patient is progressing appropriately  Patient is proggressing appropriately        Discharge Exercise Prescription (Final Exercise Prescription Changes):     Exercise Prescription Changes - 05/10/16 0800      Exercise Review   Progression Yes     Response to Exercise   Blood  Pressure (Admit) 124/68   Blood Pressure (Exercise) 154/72   Blood Pressure (Exit) 120/60   Heart Rate (Admit) 62 bpm   Heart Rate (Exercise) 80 bpm   Heart Rate (Exit) 74 bpm   Oxygen Saturation (Admit) 95 %   Oxygen Saturation (Exercise) 90 %   Oxygen Saturation (Exit) 97 %   Rating of Perceived Exertion (Exercise) 9   Perceived Dyspnea (Exercise) 11   Duration Progress to 30 minutes of continuous aerobic without signs/symptoms of physical distress   Intensity Rest + 20     Progression   Progression Continue progressive overload as per policy without signs/symptoms or physical distress.     Resistance Training   Training Prescription Yes   Weight 5   Reps 10-12     Treadmill   MPH 1.9   Grade 0   Minutes 15   METs 2.4     Arm Ergometer   Level 2.9   Watts 30   Minutes 20   METs 3.1     Home Exercise Plan   Plans to continue exercise at Home   Frequency Add 2 additional days to program exercise sessions.      Nutrition:  Target Goals: Understanding of nutrition guidelines, daily intake of sodium 1500mg , cholesterol 200mg , calories 30% from fat and 7% or less from saturated fats, daily to have 5 or more  servings of fruits and vegetables.  Biometrics:     Pre Biometrics - 02/26/16 1037      Pre Biometrics   Height 5\' 9"  (1.753 m)   Weight 236 lb 12.4 oz (107.4 kg)   Waist Circumference 44.5 inches   Hip Circumference 42.5 inches   Waist to Hip Ratio 1.05 %   BMI (Calculated) 35   Triceps Skinfold 6 mm   % Body Fat 28.6 %   Grip Strength 81.33 kg   Flexibility 0 in   Single Leg Stand 3 seconds       Nutrition Therapy Plan and Nutrition Goals:   Nutrition Discharge: Rate Your Plate Scores:     Nutrition Assessments - 02/26/16 1344      Rate Your Plate Scores   Pre Score 17      Psychosocial: Target Goals: Acknowledge presence or absence of depression, maximize coping skills, provide positive support system. Participant is able to verbalize  types and ability to use techniques and skills needed for reducing stress and depression.  Initial Review & Psychosocial Screening:     Initial Psych Review & Screening - 02/26/16 1402      Initial Review   Current issues with Current Anxiety/Panic  Main concern is being active again w/o SOB.     Family Dynamics   Good Support System? Yes     Barriers   Psychosocial barriers to participate in program Psychosocial barriers identified (see note);The patient should benefit from training in stress management and relaxation.  Though the patients QOL was 23.09, his PHQ-9 was 9. He said he feels depressed and hopeless. He siad it is more based in the fact he can't do anything without getting breathless.       Screening Interventions   Interventions Encouraged to exercise      Quality of Life Scores:     Quality of Life - 02/26/16 1038      Quality of Life Scores   Health/Function Pre 19.5 %   Socioeconomic Pre 24.86 %   Psych/Spiritual Pre 29.14 %   Family Pre 24 %   GLOBAL Pre 23.09 %      PHQ-9: Recent Review Flowsheet Data    Depression screen [facility] 2/9 02/26/2016 11/25/2015 08/25/2015 07/04/2015 06/04/2015   Decreased Interest 3 0 0 0 0   Down, Depressed, Hopeless 3 0 0 0 0   PHQ - 2 Score 6 0 0 0 0   Altered sleeping 0 - - - -   Tired, decreased energy 3 - - - -   Change in appetite 0 - - - -   Feeling bad or failure about yourself  0 - - - -   Trouble concentrating 0 - - - -   Moving slowly or fidgety/restless 0 - - - -   PHQ-9 Score 9 - - - -   Difficult doing work/chores Not difficult at all - - - -      Psychosocial Evaluation and Intervention:     Psychosocial Evaluation - 02/26/16 1406      Psychosocial Evaluation & Interventions   Interventions Stress management education;Relaxation education;Encouraged to exercise with the program and follow exercise prescription   Continued Psychosocial Services Needed Yes  The patient does not feel he needs counseling.  From my observation he would benefit from consulting with a counselor.       Psychosocial Re-Evaluation:     Psychosocial Re-Evaluation    Row Name 03/17/16 1441 04/14/16 1501 05/12/16 1339  Psychosocial Re-Evaluation   Interventions Encouraged to attend Pulmonary Rehabilitation for the exercise Encouraged to attend Pulmonary Rehabilitation for the exercise Encouraged to attend Pulmonary Rehabilitation for the exercise;Stress management education     Comments Patient's QOL score was 23.09. He says he is not depressed and does not need counseling.  Patient continues to have no psychosicoal issues identified.  Patient says he does feel depressed at times d/t his SOB. He believes if he could get his strength back, he would feel better mentally. Will continue to monitor.      Continued Psychosocial Services Needed No No No        Education: Education Goals: Education classes will be provided on a weekly basis, covering required topics. Participant will state understanding/return demonstration of topics presented.  Learning Barriers/Preferences:     Learning Barriers/Preferences - 02/26/16 1142      Learning Barriers/Preferences   Learning Barriers None   Learning Preferences Individual Instruction;Skilled Demonstration;Group Instruction      Education Topics: How Lungs Work and Diseases: - Discuss the anatomy of the lungs and diseases that can affect the lungs, such as COPD. Flowsheet Row PULMONARY REHAB OTHER RESPIRATORY from 04/29/2016 in [place]  Date  03/04/16  Educator  CG  Instruction Review Code  2- meets goals/outcomes      Exercise: -Discuss the importance of exercise, FITT principles of exercise, normal and abnormal responses to exercise, and how to exercise safely. Flowsheet Row PULMONARY REHAB OTHER RESPIRATORY from 04/29/2016 in [place]  Date  03/11/16  Educator  [place]  Instruction Review Code  2- meets  goals/outcomes      Environmental Irritants: -Discuss types of environmental irritants and how to limit exposure to environmental irritants. Flowsheet Row PULMONARY REHAB OTHER RESPIRATORY from 04/29/2016 in [place]  Date  03/18/16  Educator  Nils Flack  Instruction Review Code  2- meets goals/outcomes      Meds/Inhalers and oxygen: - Discuss respiratory medications, definition of an inhaler and oxygen, and the proper way to use an inhaler and oxygen.   Energy Saving Techniques: - Discuss methods to conserve energy and decrease shortness of breath when performing activities of daily living.  Flowsheet Row PULMONARY REHAB OTHER RESPIRATORY from 04/29/2016 in [place]  Date  04/01/16  Educator  Suzanne Boron  Instruction Review Code  2- meets goals/outcomes      Bronchial Hygiene / Breathing Techniques: - Discuss breathing mechanics, pursed-lip breathing technique,  proper posture, effective ways to clear airways, and other functional breathing techniques Flowsheet Row PULMONARY REHAB OTHER RESPIRATORY from 04/29/2016 in [place]  Date  04/08/16  Educator  Nils Flack   Instruction Review Code  2- meets goals/outcomes      Cleaning Equipment: - Provides group verbal and written instruction about the health risks of elevated stress, cause of high stress, and healthy ways to reduce stress.   Nutrition I: Fats: - Discuss the types of cholesterol, what cholesterol does to the body, and how cholesterol levels can be controlled. Flowsheet Row PULMONARY REHAB OTHER RESPIRATORY from 04/29/2016 in [place]  Date  04/15/16  Educator  Nils Flack  Instruction Review Code  2- meets goals/outcomes      Nutrition II: Labels: -Discuss the different components of food labels and how to read food labels. Flowsheet Row PULMONARY REHAB OTHER RESPIRATORY from 04/29/2016 in [place]  Date  04/29/16  Educator  Suzanne Boron  Instruction Review Code  2- meets goals/outcomes      Respiratory Infections: - Discuss the signs and symptoms of respiratory infections, ways to prevent respiratory infections, and the importance of seeking medical treatment when having a respiratory infection.   Stress I: Signs and Symptoms: - Discuss the causes of stress, how stress may lead to anxiety and depression, and ways to limit stress.   Stress II: Relaxation: -Discuss relaxation techniques to limit stress.   Oxygen for Home/Travel: - Discuss how to prepare for travel when on oxygen and proper ways to transport and store oxygen to ensure safety.   Knowledge Questionnaire Score:     Knowledge Questionnaire Score - 02/26/16 1335      Knowledge Questionnaire Score   Pre Score 10/14      Core Components/Risk Factors/Patient Goals at Admission:     Personal Goals and Risk Factors at Admission - 02/26/16 1344      Core Components/Risk Factors/Patient Goals on Admission    Weight Management Yes   Intervention Weight Management/Obesity: Establish reasonable short term and long term weight goals.   Admit Weight 237 lb (107.5 kg)   Goal Weight: Short Term 227 lb (103 kg)   Goal Weight: Long Term 217 lb (98.4 kg)   Expected Outcomes Short Term: Continue to assess and modify interventions until short term weight is achieved;Long Term: Adherence to nutrition and physical activity/exercise program aimed toward attainment of established weight goal   Sedentary Yes   Intervention Provide advice, education, support and counseling about physical activity/exercise needs.;Develop an individualized exercise prescription for aerobic and resistive training based on initial evaluation findings, risk stratification, comorbidities and participant's personal goals.   Expected Outcomes Achievement of increased cardiorespiratory fitness and enhanced flexibility, muscular endurance  and strength shown through measurements of functional capacity and personal statement of participant.   Increase Strength and Stamina Yes   Intervention Provide advice, education, support and counseling about physical activity/exercise needs.;Develop an individualized exercise prescription for aerobic and resistive training based on initial evaluation findings, risk stratification, comorbidities and participant's personal goals.   Expected Outcomes Achievement of increased cardiorespiratory fitness and enhanced flexibility, muscular endurance and strength shown through measurements of functional capacity and personal statement of participant.   Improve shortness of breath with ADL's Yes   Intervention Provide education, individualized exercise plan and daily activity instruction to help decrease symptoms of SOB with activities of daily living.   Expected Outcomes Short Term: Achieves a reduction of symptoms when performing activities of daily living.   Develop more efficient breathing techniques such as purse lipped breathing and diaphragmatic breathing; and practicing self-pacing with activity Yes   Intervention Provide education, demonstration and support about specific breathing techniuqes utilized for more efficient breathing. Include techniques such as pursed lipped breathing, diaphragmatic breathing and self-pacing activity.   Expected Outcomes Short Term: Participant will be able to demonstrate and use breathing techniques as needed throughout daily activities.   Diabetes Yes   Intervention Provide education about signs/symptoms and action to take for hypo/hyperglycemia.   Expected Outcomes Long Term: Attainment of HbA1C < 7%.;Short Term: Participant verbalizes understanding of the signs/symptoms and immediate care of hyper/hypoglycemia, proper foot care and importance of medication, aerobic/resistive exercise and nutrition plan for blood glucose control.   Stress Yes   Intervention Offer  individual and/or small group education and counseling on adjustment to heart disease, stress management and health-related lifestyle change. Teach and support self-help strategies.   Expected Outcomes Short Term: Participant demonstrates changes in  health-related behavior, relaxation and other stress management skills, ability to obtain effective social support, and compliance with psychotropic medications if prescribed.;Long Term: Emotional wellbeing is indicated by absence of clinically significant psychosocial distress or social isolation.   Personal Goal Other Yes   Personal Goal Lose weight 10-20lbs before graduation. then continue to lose up to 50lbs. Breath better.   Intervention Attend Pulmonary Rehab 2 x week and then supplement exercise at home with 3 x week.    Expected Outcomes Reach personal goals.       Core Components/Risk Factors/Patient Goals Review:      Goals and Risk Factor Review    Row Name 02/26/16 1402 03/17/16 1440 04/14/16 1459 05/12/16 1335       Core Components/Risk Factors/Patient Goals Review   Personal Goals Review Weight Management/Obesity;Improve shortness of breath with ADL's;Develop more efficient breathing techniques such as purse lipped breathing and diaphragmatic breathing and practicing self-pacing with activity.;Diabetes;Stress Weight Management/Obesity;Increase Strength and Stamina;Improve shortness of breath with ADL's;Develop more efficient breathing techniques such as purse lipped breathing and diaphragmatic breathing and practicing self-pacing with activity.;Diabetes;Stress Weight Management/Obesity;Sedentary;Improve shortness of breath with ADL's;Increase Strength and Stamina;Develop more efficient breathing techniques such as purse lipped breathing and diaphragmatic breathing and practicing self-pacing with activity.;Stress;Increase knowledge of respiratory medications and ability to use respiratory devices properly.  Lose weight 10-20lbs before  graduation. then continue to lose up to 50lbs. Breath better. Weight Management/Obesity;Increase Strength and Stamina;Improve shortness of breath with ADL's;Diabetes;Stress;Develop more efficient breathing techniques such as purse lipped breathing and diaphragmatic breathing and practicing self-pacing with activity.  Lose weight 10-20lbs before graduation. then continue to lose up to 50lbs. Breath better.    Review  - Patient new to program. He has attended 6 sessions. Will continue to monitor for progress.  Patient has attended 12 sessions losing 2 lbs. He is doing well in the program with some increased strength and stamina and some improvement in his SOB.  Patient has attended 15 sessions. He has maintained his weight. He missed the last 3 sessions d/t illness. He is progressing well with improvement in his strength, stamina and SOB.     Expected Outcomes  - Patient will complete the program meeting his personal goals.  Patient will continue to attend sessions meeting his personal goals.  Patient will continue to attend sessions and complete the program meeting his personal goals.        Core Components/Risk Factors/Patient Goals at Discharge (Final Review):      Goals and Risk Factor Review - 05/12/16 1335      Core Components/Risk Factors/Patient Goals Review   Personal Goals Review Weight Management/Obesity;Increase Strength and Stamina;Improve shortness of breath with ADL's;Diabetes;Stress;Develop more efficient breathing techniques such as purse lipped breathing and diaphragmatic breathing and practicing self-pacing with activity.  Lose weight 10-20lbs before graduation. then continue to lose up to 50lbs. Breath better.   Review Patient has attended 15 sessions. He has maintained his weight. He missed the last 3 sessions d/t illness. He is progressing well with improvement in his strength, stamina and SOB.    Expected Outcomes Patient will continue to attend sessions and complete the program  meeting his personal goals.       ITP Comments:   Comments: ITP REVIEW Pt is making expected progress toward pulmonary rehab goals after completing 15 sessions. Recommend continued exercise, life style modification, education, and utilization of breathing techniques to increase stamina and strength and decrease shortness of breath with exertion.

## 2016-05-13 ENCOUNTER — Encounter (HOSPITAL_COMMUNITY): Payer: Medicare Other

## 2016-05-14 DIAGNOSIS — H43813 Vitreous degeneration, bilateral: Secondary | ICD-10-CM | POA: Diagnosis not present

## 2016-05-14 DIAGNOSIS — H43822 Vitreomacular adhesion, left eye: Secondary | ICD-10-CM | POA: Diagnosis not present

## 2016-05-14 DIAGNOSIS — H35371 Puckering of macula, right eye: Secondary | ICD-10-CM | POA: Diagnosis not present

## 2016-05-14 DIAGNOSIS — E113513 Type 2 diabetes mellitus with proliferative diabetic retinopathy with macular edema, bilateral: Secondary | ICD-10-CM | POA: Diagnosis not present

## 2016-05-14 LAB — HM DIABETES EYE EXAM

## 2016-05-15 ENCOUNTER — Encounter: Payer: Self-pay | Admitting: Internal Medicine

## 2016-05-18 ENCOUNTER — Encounter (HOSPITAL_COMMUNITY): Admission: RE | Admit: 2016-05-18 | Payer: Medicare Other | Source: Ambulatory Visit

## 2016-05-20 ENCOUNTER — Encounter (HOSPITAL_COMMUNITY): Payer: Medicare Other

## 2016-05-25 ENCOUNTER — Encounter (HOSPITAL_COMMUNITY)
Admission: RE | Admit: 2016-05-25 | Discharge: 2016-05-25 | Disposition: A | Discharge status: Home / Self Care | Payer: Medicare Other | Source: Ambulatory Visit | Attending: Cardiology | Admitting: Cardiology

## 2016-05-25 DIAGNOSIS — J45909 Unspecified asthma, uncomplicated: Secondary | ICD-10-CM | POA: Diagnosis not present

## 2016-05-25 DIAGNOSIS — R0602 Shortness of breath: Secondary | ICD-10-CM | POA: Diagnosis not present

## 2016-05-25 NOTE — Progress Notes (Signed)
Daily Session Note  Patient Details  Name: QUINTAN SALDIVAR MRN: [id number] Date of Birth: 07/20/1945 Referring Provider:   Flowsheet Row PULMONARY REHAB OTHER RESP ORIENTATION from 02/26/2016 in [place]  Referring Provider  Dr. Harl Bowie      Encounter Date: 05/25/2016  Check In:     Session Check In - 05/25/16 1045      Check-In   Location [facility]   Staff Present Diane Angelina Pih, MS, EP, [facility], Exercise Physiologist;Paislie Tessler Luther Parody, BS, EP, Exercise Physiologist   Supervising physician immediately available to respond to emergencies See telemetry face sheet for immediately available MD   Medication changes reported     No   Fall or balance concerns reported    No   Warm-up and Cool-down Performed as group-led instruction   Resistance Training Performed Yes   VAD Patient? No     Pain Assessment   Currently in Pain? No/denies   Pain Score 0-No pain   Multiple Pain Sites No      Capillary Blood Glucose: No results found for this or any previous visit (from the past 24 hour(s)).   Goals Met:  Independence with exercise equipment Exercise tolerated well No report of cardiac concerns or symptoms Strength training completed today  Goals Unmet:  Not Applicable  Comments: Check out 1145   Dr. Sinda Du is Medical Director for [facility] Pulmonary Rehab.

## 2016-05-26 DIAGNOSIS — E291 Testicular hypofunction: Secondary | ICD-10-CM | POA: Diagnosis not present

## 2016-05-27 ENCOUNTER — Ambulatory Visit: Payer: Medicare Other | Admitting: Family Medicine

## 2016-05-27 ENCOUNTER — Encounter (HOSPITAL_COMMUNITY)
Admission: RE | Admit: 2016-05-27 | Discharge: 2016-05-27 | Disposition: A | Discharge status: Home / Self Care | Payer: Medicare Other | Source: Ambulatory Visit | Attending: Cardiology | Admitting: Cardiology

## 2016-05-27 DIAGNOSIS — J45909 Unspecified asthma, uncomplicated: Secondary | ICD-10-CM

## 2016-05-27 DIAGNOSIS — R0602 Shortness of breath: Secondary | ICD-10-CM

## 2016-05-27 NOTE — Progress Notes (Signed)
Daily Session Note  Patient Details  Name: Lance [name] MRN: [id number] Date of Birth: Apr 03, 1946 Referring Provider:   Flowsheet Row PULMONARY REHAB OTHER RESP ORIENTATION from 02/26/2016 in [place]  Referring Provider  Dr. Harl Bowie      Encounter Date: 05/27/2016  Check In:     Session Check In - 05/27/16 1045      Check-In   Location [facility]   Staff Present Diane Angelina Pih, MS, EP, [facility], Exercise Physiologist;Gregory Luther Parody, BS, EP, Exercise Physiologist;Chace Klippel Wynetta Emery, RN, BSN   Supervising physician immediately available to respond to emergencies See telemetry face sheet for immediately available MD   Medication changes reported     No   Fall or balance concerns reported    No   Warm-up and Cool-down Performed as group-led instruction   Resistance Training Performed Yes   VAD Patient? No     Pain Assessment   Currently in Pain? No/denies   Pain Score 0-No pain   Multiple Pain Sites No      Capillary Blood Glucose: No results found for this or any previous visit (from the past 24 hour(s)).   Goals Met:  Independence with exercise equipment Exercise tolerated well No report of cardiac concerns or symptoms Strength training completed today  Goals Unmet:  Not Applicable  Comments: Check out 1145   Dr. Sinda Du is Medical Director for [facility] Pulmonary Rehab.

## 2016-06-01 ENCOUNTER — Encounter (HOSPITAL_COMMUNITY): Payer: Medicare Other

## 2016-06-01 DIAGNOSIS — R6882 Decreased libido: Secondary | ICD-10-CM | POA: Diagnosis not present

## 2016-06-01 DIAGNOSIS — Z5181 Encounter for therapeutic drug level monitoring: Secondary | ICD-10-CM | POA: Diagnosis not present

## 2016-06-01 DIAGNOSIS — I2510 Atherosclerotic heart disease of native coronary artery without angina pectoris: Secondary | ICD-10-CM | POA: Diagnosis not present

## 2016-06-01 DIAGNOSIS — Z794 Long term (current) use of insulin: Secondary | ICD-10-CM | POA: Diagnosis not present

## 2016-06-01 DIAGNOSIS — E1142 Type 2 diabetes mellitus with diabetic polyneuropathy: Secondary | ICD-10-CM | POA: Diagnosis not present

## 2016-06-01 DIAGNOSIS — R232 Flushing: Secondary | ICD-10-CM | POA: Diagnosis not present

## 2016-06-01 DIAGNOSIS — E230 Hypopituitarism: Secondary | ICD-10-CM | POA: Diagnosis not present

## 2016-06-01 DIAGNOSIS — Z125 Encounter for screening for malignant neoplasm of prostate: Secondary | ICD-10-CM | POA: Diagnosis not present

## 2016-06-01 DIAGNOSIS — R61 Generalized hyperhidrosis: Secondary | ICD-10-CM | POA: Diagnosis not present

## 2016-06-03 ENCOUNTER — Encounter (HOSPITAL_COMMUNITY)
Admission: RE | Admit: 2016-06-03 | Discharge: 2016-06-03 | Disposition: A | Discharge status: Home / Self Care | Payer: Medicare Other | Source: Ambulatory Visit | Attending: Cardiology | Admitting: Cardiology

## 2016-06-03 DIAGNOSIS — R0602 Shortness of breath: Secondary | ICD-10-CM | POA: Diagnosis not present

## 2016-06-03 DIAGNOSIS — J45909 Unspecified asthma, uncomplicated: Secondary | ICD-10-CM | POA: Insufficient documentation

## 2016-06-03 NOTE — Progress Notes (Signed)
Daily Session Note  Patient Details  Name: Lance [name] MRN: [id number] Date of Birth: 1946/02/13 Referring Provider:   Flowsheet Row PULMONARY REHAB OTHER RESP ORIENTATION from 02/26/2016 in [place]  Referring Provider  Dr. Harl Bowie      Encounter Date: 06/03/2016  Check In:     Session Check In - 06/03/16 1045      Check-In   Location [facility]   Staff Present Diane Angelina Pih, MS, EP, [name], Exercise Physiologist;Gevin Perea Luther Parody, BS, EP, Exercise Physiologist   Supervising physician immediately available to respond to emergencies See telemetry face sheet for immediately available MD   Medication changes reported     No   Fall or balance concerns reported    No   Warm-up and Cool-down Performed as group-led instruction   Resistance Training Performed Yes   VAD Patient? No     Pain Assessment   Currently in Pain? No/denies   Pain Score 0-No pain   Multiple Pain Sites No      Capillary Blood Glucose: No results found for this or any previous visit (from the past 24 hour(s)).   Goals Met:  Independence with exercise equipment Improved SOB with ADL's Using PLB without cueing & demonstrates good technique Exercise tolerated well No report of cardiac concerns or symptoms Strength training completed today  Goals Unmet:  Not Applicable  Comments: Check out 1145   Dr. Sinda Du is Medical Director for [facility] Pulmonary Rehab.

## 2016-06-08 ENCOUNTER — Encounter (HOSPITAL_COMMUNITY)
Admission: RE | Admit: 2016-06-08 | Discharge: 2016-06-08 | Disposition: A | Discharge status: Home / Self Care | Payer: Medicare Other | Source: Ambulatory Visit | Attending: Cardiology | Admitting: Cardiology

## 2016-06-08 DIAGNOSIS — R0602 Shortness of breath: Secondary | ICD-10-CM

## 2016-06-08 DIAGNOSIS — J45909 Unspecified asthma, uncomplicated: Secondary | ICD-10-CM | POA: Diagnosis not present

## 2016-06-08 NOTE — Progress Notes (Signed)
Daily Session Note  Patient Details  Name: Lance [name] MRN: [id number] Date of Birth: Apr 24, 1946 Referring Provider:   Flowsheet Row PULMONARY REHAB OTHER RESP ORIENTATION from 02/26/2016 in [place]  Referring Provider  Dr. Harl Bowie      Encounter Date: 06/08/2016  Check In:     Session Check In - 06/08/16 1042      Check-In   Location [facility]   Staff Present Suzanne Boron, BS, EP, Exercise Physiologist;Diane Coad, MS, EP, [facility], Exercise Physiologist   Supervising physician immediately available to respond to emergencies See telemetry face sheet for immediately available MD   Medication changes reported     No   Fall or balance concerns reported    No   Warm-up and Cool-down Performed as group-led instruction   Resistance Training Performed Yes   VAD Patient? No     Pain Assessment   Currently in Pain? No/denies   Pain Score 0-No pain   Multiple Pain Sites No      Capillary Blood Glucose: No results found for this or any previous visit (from the past 24 hour(s)).   Goals Met:  Independence with exercise equipment Improved SOB with ADL's Using PLB without cueing & demonstrates good technique Exercise tolerated well No report of cardiac concerns or symptoms Strength training completed today  Goals Unmet:  Not Applicable  Comments: Check out 1145   Dr. Sinda Du is Medical Director for [facility] Pulmonary Rehab.

## 2016-06-09 ENCOUNTER — Encounter: Payer: Self-pay | Admitting: Family Medicine

## 2016-06-09 ENCOUNTER — Ambulatory Visit (INDEPENDENT_AMBULATORY_CARE_PROVIDER_SITE_OTHER): Payer: Medicare Other | Admitting: Family Medicine

## 2016-06-09 VITALS — BP 138/82 | Ht 69.0 in | Wt 242.2 lb

## 2016-06-09 DIAGNOSIS — R040 Epistaxis: Secondary | ICD-10-CM

## 2016-06-09 MED ORDER — CEPHALEXIN 500 MG PO CAPS: 500.0000 mg | ORAL_CAPSULE | Freq: Four times a day (QID) | ORAL | 0 refills | Status: DC

## 2016-06-09 NOTE — Patient Instructions (Signed)
Saline spray four times per day both nostrils

## 2016-06-09 NOTE — Progress Notes (Signed)
   Subjective:    Patient ID: Lance [name], male    DOB: 1945-05-22, 71 y.o.   MRN: [id number]  HPI Patient reports bad nose bleeds at night for last several days.  Two months ao had nasal bleed with sinus infxn  Blood nasal blleed twimes two the past week  Last night another spell of nasal bleed No nasal hemorhage hex  No tenerderness or soreness or stuffines   hx of going to heart therapy 'BP good , oxygen level good     Review of Systems No headache, no major weight loss or weight gain, no chest pain no back pain abdominal pain no change in bowel habits complete ROS otherwise negative     Objective:   Physical Exam Alert vitals stable, NAD. Blood pressure good on repeat. HEENT Slight dried blood left nasal passage otherwise normal. Lungs clear. Heart regular rate and rhythm.        Assessment & Plan:  Epistaxis with potential element of rhinosinusitis and flare of baseline anxiety regarding plan trial of antibiotics saline spray. ENT referral.

## 2016-06-10 ENCOUNTER — Encounter: Payer: Self-pay | Admitting: Family Medicine

## 2016-06-10 ENCOUNTER — Encounter (HOSPITAL_COMMUNITY)
Admission: RE | Admit: 2016-06-10 | Discharge: 2016-06-10 | Disposition: A | Discharge status: Home / Self Care | Payer: Medicare Other | Source: Ambulatory Visit | Attending: Cardiology | Admitting: Cardiology

## 2016-06-10 DIAGNOSIS — J45909 Unspecified asthma, uncomplicated: Secondary | ICD-10-CM

## 2016-06-10 DIAGNOSIS — R0602 Shortness of breath: Secondary | ICD-10-CM | POA: Diagnosis not present

## 2016-06-10 NOTE — Progress Notes (Signed)
Daily Session Note  Patient Details  Name: Lance [name] MRN: [id number] Date of Birth: Sep 14, 1945 Referring Provider:   Flowsheet Row PULMONARY REHAB OTHER RESP ORIENTATION from 02/26/2016 in [place]  Referring Provider  Dr. Harl Bowie      Encounter Date: 06/10/2016  Check In:     Session Check In - 06/10/16 1053      Check-In   Location [facility]   Staff Present Aundra Dubin, RN, BSN;Mj Willis Luther Parody, BS, EP, Exercise Physiologist   Supervising physician immediately available to respond to emergencies See telemetry face sheet for immediately available MD   Medication changes reported     No   Fall or balance concerns reported    No   Warm-up and Cool-down Performed as group-led instruction   Resistance Training Performed Yes   VAD Patient? No     Pain Assessment   Currently in Pain? No/denies   Pain Score 0-No pain   Multiple Pain Sites No      Capillary Blood Glucose: No results found for this or any previous visit (from the past 24 hour(s)).   Goals Met:  Independence with exercise equipment Improved SOB with ADL's Using PLB without cueing & demonstrates good technique Exercise tolerated well No report of cardiac concerns or symptoms Strength training completed today  Goals Unmet:  Not Applicable  Comments: Check out 1145   Dr. Sinda Du is Medical Director for [facility] Pulmonary Rehab.

## 2016-06-10 NOTE — Progress Notes (Signed)
Pulmonary Individual Treatment Plan  Patient Details  Name: Lance [name] MRN: [id number] Date of Birth: 02-Mar-1946 Referring Provider:   Flowsheet Row PULMONARY REHAB OTHER RESP ORIENTATION from 02/26/2016 in [place]  Referring Provider  Dr. Harl Bowie      Initial Encounter Date:  Flowsheet Row PULMONARY REHAB OTHER RESP ORIENTATION from 02/26/2016 in [place]  Date  02/26/16  Referring Provider  Dr. Harl Bowie      Visit Diagnosis: SOB (shortness of breath)  Asthma in adult without complication, unspecified asthma severity, unspecified whether persistent  Patient's Home Medications on Admission:   Current Outpatient Prescriptions:  .  albuterol (PROVENTIL HFA;VENTOLIN HFA) 108 (90 BASE) MCG/ACT inhaler, Inhale 2 puffs into the lungs every 6 (six) hours as needed for wheezing., Disp: 1 Inhaler, Rfl: 2 .  albuterol (PROVENTIL) (2.5 MG/3ML) 0.083% nebulizer solution, Take 3 mLs (2.5 mg total) by nebulization every 6 (six) hours as needed for wheezing or shortness of breath., Disp: 150 mL, Rfl: 0 .  ALPRAZolam (XANAX) 1 MG tablet, TAKE 1/2-1 TABLET 2 TIMES A DAY AS NEEDED (Patient taking differently: TAKE 1/2-1 TABLET (0.5MG  TO 1 MG) 2 TIMES A DAY AS NEEDED FOR ANXIETY), Disp: 60 tablet, Rfl: 3 .  aspirin EC 81 MG tablet, Take 81 mg by mouth daily., Disp: , Rfl:  .  atorvastatin (LIPITOR) 80 MG tablet, Take 80 mg by mouth daily. , Disp: , Rfl:  .  budesonide-formoterol (SYMBICORT) 160-4.5 MCG/ACT inhaler, Inhale 2 puffs into the lungs 2 (two) times daily., Disp: 1 Inhaler, Rfl: 3 .  cephALEXin (KEFLEX) 500 MG capsule, Take 1 capsule (500 mg total) by mouth 4 (four) times daily., Disp: 30 capsule, Rfl: 0 .  clopidogrel (PLAVIX) 75 MG tablet, TAKE ONE TABLET EVERY MORNING WITH BREAKFAST, Disp: 30 tablet, Rfl: 5 .  dicyclomine (BENTYL) 10 MG capsule, TAKE ONE CAPSULE EVERY MORNING AND ONE CAPSULE AT BEDTIME AS NEEDED FOR INCREASED STOOLS AND  BLOATING (Patient taking differently: Take 10 mg by mouth 2 (two) times daily as needed (for IBS (diarrhea issues)). ), Disp: 60 capsule, Rfl: 5 .  donepezil (ARICEPT) 10 MG tablet, Take 1 tablet (10 mg total) by mouth at bedtime., Disp: 90 tablet, Rfl: 3 .  DULoxetine (CYMBALTA) 60 MG capsule, TAKE ONE (1) CAPSULE EACH DAY, Disp: 30 capsule, Rfl: 5 .  empagliflozin (JARDIANCE) 25 MG TABS tablet, Take 25 mg by mouth daily., Disp: , Rfl:  .  fexofenadine (ALLEGRA) 180 MG tablet, Take 180 mg by mouth daily as needed for allergies. , Disp: , Rfl:  .  furosemide (LASIX) 20 MG tablet, Take 1 tablet (20 mg total) by mouth daily., Disp: 90 tablet, Rfl: 3 .  gabapentin (NEURONTIN) 300 MG capsule, TAKE ONE TO TWO CAPSULES BY MOUTH THREE TIMES AS DAY AS NEEDED AS NEEDED (Patient taking differently: TAKE ONE CAPSULE (300 MG) EVERY MORNING & TWO CAPSULES (600 MG ) AT BEDTIME), Disp: 180 capsule, Rfl: 6 .  glucose blood (ONE TOUCH ULTRA TEST) test strip, Use as instructed 4 times daily. E11.65, Disp: 150 each, Rfl: 1 .  guaiFENesin (MUCINEX) 600 MG 12 hr tablet, Take 1,200 mg by mouth 2 (two) times daily. , Disp: , Rfl:  .  HUMALOG KWIKPEN 100 UNIT/ML KiwkPen, Inject 0.32-0.38 mLs (32-38 Units total) into the skin 3 (three) times daily. (Patient taking differently: Inject 22-26 Units into the skin 3 (three) times daily before meals. ), Disp: 30 pen, Rfl: 2 .  HYDROcodone-acetaminophen (LORTAB) 7.5-500 MG  tablet, Take 1 tablet by mouth every 6 (six) hours as needed for pain., Disp: , Rfl:  .  Insulin Detemir (LEVEMIR FLEXTOUCH) 100 UNIT/ML Pen, Inject 90 Units into the skin daily at 10 pm. (Patient taking differently: Inject 100 Units into the skin at bedtime. ), Disp: 30 pen, Rfl: 2 .  losartan (COZAAR) 25 MG tablet, Take 1 tablet (25 mg total) by mouth daily., Disp: 30 tablet, Rfl: 11 .  magnesium oxide (MAG-OX) 400 MG tablet, TAKE ONE TABLET ONCE DAILY, Disp: 30 tablet, Rfl: 11 .  metoprolol tartrate (LOPRESSOR)  25 MG tablet, Take 1 tablet (25 mg total) by mouth 2 (two) times daily., Disp: 60 tablet, Rfl: 11 .  nitroGLYCERIN (NITROSTAT) 0.4 MG SL tablet, Place 1 tablet (0.4 mg total) under the tongue every 5 (five) minutes as needed. For chest pain (Patient taking differently: Place 0.4 mg under the tongue every 5 (five) minutes as needed. For chest pain), Disp: 25 tablet, Rfl: 3 .  olopatadine (PATANOL) 0.1 % ophthalmic solution, Place 1 drop into both eyes 2 (two) times daily. (Patient taking differently: Place 1 drop into both eyes 2 (two) times daily as needed (for itchy/irritated eyes). ), Disp: 5 mL, Rfl: 12 .  pantoprazole (PROTONIX) 40 MG tablet, Take 1 tablet (40 mg total) by mouth 2 (two) times daily., Disp: 60 tablet, Rfl: 5 .  polyethylene glycol-electrolytes (TRILYTE) 420 g solution, Take 4,000 mLs by mouth as directed. (Patient not taking: Reported on 06/09/2016), Disp: 4000 mL, Rfl: 0 .  Probiotic Product (ALIGN PO), Take 1 tablet by mouth daily. , Disp: , Rfl:  .  sildenafil (REVATIO) 20 MG tablet, Take 3 -5 tabs as needed for erectile dysfunction, Disp: 20 tablet, Rfl: 4 .  sulfamethoxazole-trimethoprim (BACTRIM DS,SEPTRA DS) 800-160 MG tablet, Take 1 tablet by mouth 2 (two) times daily. (Patient not taking: Reported on 06/09/2016), Disp: 20 tablet, Rfl: 0 .  traZODone (DESYREL) 100 MG tablet, TAKE 1/2 TO 1 TABLET AT BEDTIME AS NEEDED FOR SLEEP (Patient taking differently: TAKE 1 TABLET (100 MG) BY MOUTH AT BEDTIME), Disp: 30 tablet, Rfl: 5 .  UNIFINE PENTIPS 31G X 6 MM MISC, USE PEN NEEDLES 4 TIMES A DAY, Disp: 150 each, Rfl: 0 .  vitamin B-12 (CYANOCOBALAMIN) 1000 MCG tablet, Take 1,000 mcg by mouth daily. Reported on 08/25/2015, Disp: , Rfl:  .  Vitamin D, Ergocalciferol, (DRISDOL) 50000 units CAPS capsule, TAKE ONE CAPSULE BY MOUTH ONCE A WEEK (Patient taking differently: TAKE ONE CAPSULE BY MOUTH ONCE A MONTH -3RD OF MONTH), Disp: 12 capsule, Rfl: 0  Past Medical History: Past Medical History:   Diagnosis Date  . Allergic rhinitis, cause unspecified   . Anxiety and depression   . ASCVD (arteriosclerotic cardiovascular disease)    70% mid left anterior descending lesion on cath in 06/1995; left anterior desending DES placed in 8/03 and RCA stent in 9/03; captain 3/05 revealed 90% second marginal for which PCI was performed, 70% PDA and a total obstruction of the first diagonal and marginal; sudden cardiac death in [place] in 2003-11-10 for which automatic implantable cardiac defibrillator placed; negativ stress nuclear 10/07  . Atrial fibrillation ([place])   . Benign prostatic hypertrophy   . C. difficile diarrhea   . CAD (coronary artery disease) 1997  . CHF (congestive heart failure) ([place])   . COPD (chronic obstructive pulmonary disease) ([place])   . Coronary artery disease   . CVD (cerebrovascular disease) 05/2008   Transient ischemic attack; carotid ultrasound-plaque without focal  disease  . Degenerative joint disease 2002   C-spine fusion   . Diabetes mellitus    Insulin requirement  . Diabetes mellitus without mention of complication   . Diabetic peripheral neuropathy ([place]) 09/06/2014  . Erectile dysfunction   . Esophageal reflux   . HOH (hard of hearing)   . Hx-TIA (transient ischemic attack) 2010  . Hyperlipidemia   . Hypertension   . ICD (implantable cardiac defibrillator) in place 2005   [place] ICD, for SCD  . Memory deficits 09/05/2013  . Myocardial infarction   . Other testicular hypofunction   . Pacemaker   . S/P endoscopy Dec 2011   RMR: nl esophagus, hyperplastic polyp, active gastritis, no H.pylori.   . Shortness of breath   . Stroke ([place])   . Tobacco abuse    100 pack/year comsuption; cigarettes discontinued 2003; all tobacco products in 2008  . Tubular adenoma   . Vitreous floaters of left eye     Tobacco Use: History  Smoking Status  . Former Smoker  . Packs/day: 3.00  . Years: 40.00  . Types: Cigarettes  . Start date: 05/03/1954  . Quit date:  05/03/1998  Smokeless Tobacco  . Current User  . Types: Chew    Labs: Recent Review Flowsheet Data    Labs for ITP Cardiac and Pulmonary Rehab Latest Ref Rng & Units 07/25/2013 11/06/2014 02/06/2015 05/15/2015 11/21/2015   Cholestrol 125 - 200 mg/dL 154 - - - 125   LDLCALC <130 mg/dL 92 - - - 55   HDL >=40 mg/dL 31(L) - - - 34(L)   Trlycerides <150 mg/dL 155(H) - - - 178(H)   Hemoglobin A1c <5.7 % - 10.2(A) 9.4(A) 9.9(H) 9.0(H)   PHART 7.350 - 7.450 - - - - -   PCO2ART 35.0 - 45.0 mmHg - - - - -   HCO3 20.0 - 24.0 mEq/L - - - - -   TCO2 0 - 100 mmol/L - - - - -   ACIDBASEDEF 0.0 - 2.0 mmol/L - - - - -   O2SAT % - - - - -      Capillary Blood Glucose: Lab Results  Component Value Date   GLUCAP 226 (H) 05/10/2016   GLUCAP 166 (H) 07/09/2015   GLUCAP 287 (H) 07/08/2015   GLUCAP 222 (H) 07/08/2015   GLUCAP 118 (H) 07/08/2015     ADL UCSD:     Pulmonary Assessment Scores    Row Name 02/26/16 1340         ADL UCSD   ADL Phase Entry     SOB Score total 114     Rest 2     Walk 6     Stairs 4     Bath 2     Dress 2     Shop 3       CAT Score   CAT Score 26       mMRC Score   mMRC Score 4        Pulmonary Function Assessment:     Pulmonary Function Assessment - 02/26/16 1337      Pulmonary Function Tests   FVC% 87 %   FEV1% 88 %   FEV1/FVC Ratio 101   RV% 111 %   DLCO% 92 %     Initial Spirometry Results   FVC% 87 %   FEV1% 88 %   FEV1/FVC Ratio 101     Post Bronchodilator Spirometry Results   FVC% 88 %  FEV1% 94 %   FEV1/FVC Ratio 106     Breath   Shortness of Breath Yes;Limiting activity      Exercise Target Goals:    Exercise Program Goal: Individual exercise prescription set with THRR, safety & activity barriers. Participant demonstrates ability to understand and report RPE using BORG scale, to self-measure pulse accurately, and to acknowledge the importance of the exercise prescription.  Exercise Prescription Goal: Starting with  aerobic activity 30 plus minutes a day, 3 days per week for initial exercise prescription. Provide home exercise prescription and guidelines that participant acknowledges understanding prior to discharge.  Activity Barriers & Risk Stratification:     Activity Barriers & Cardiac Risk Stratification - 02/26/16 1141      Activity Barriers & Cardiac Risk Stratification   Activity Barriers Back Problems;Left Knee Replacement;Joint Problems   Cardiac Risk Stratification High      6 Minute Walk:     6 Minute Walk    Row Name 02/26/16 1134         6 Minute Walk   Phase Initial     Distance 950 feet     Distance % Change 0 %     Walk Time 6 minutes     # of Rest Breaks 0     MPH 1.79     METS 2.37     RPE 15     Perceived Dyspnea  14     VO2 Peak 6.7     Symptoms No     Resting HR 69 bpm     Resting BP 130/72     Max Ex. HR 82 bpm     Max Ex. BP 150/76     2 Minute Post BP 138/74        Initial Exercise Prescription:     Initial Exercise Prescription - 02/26/16 1000      Date of Initial Exercise RX and Referring Provider   Date 02/26/16   Referring Provider Dr. Harl Bowie     Treadmill   MPH 1   Grade 0   Minutes 15   METs 1.6     Arm Ergometer   Level 2   Watts 15   Minutes 20   METs 1.9     Prescription Details   Frequency (times per week) 3   Duration Progress to 30 minutes of continuous aerobic without signs/symptoms of physical distress     Intensity   THRR REST +  20   THRR 40-80% of Max Heartrate [phone]   Ratings of Perceived Exertion 11-13   Perceived Dyspnea 0-4     Progression   Progression Continue progressive overload as per policy without signs/symptoms or physical distress.     Resistance Training   Training Prescription Yes   Weight 1   Reps 10-12      Perform Capillary Blood Glucose checks as needed.  Exercise Prescription Changes:      Exercise Prescription Changes    Row Name 03/09/16 1200 04/08/16 1500 05/10/16 0800  06/08/16 1200       Exercise Review   Progression Yes Yes Yes Yes      Response to Exercise   Blood Pressure (Admit) 128/62 156/80 124/68 114/60    Blood Pressure (Exercise) 130/68 162/72 154/72 130/68    Blood Pressure (Exit) 146/70 140/78 120/60 132/70    Heart Rate (Admit) 70 bpm 74 bpm 62 bpm 76 bpm    Heart Rate (Exercise) 87 bpm 94 bpm 80 bpm 88 bpm  Heart Rate (Exit) 74 bpm 83 bpm 74 bpm 87 bpm    Oxygen Saturation (Admit) 96 % 95 % 95 % 97 %    Oxygen Saturation (Exercise) 96 % 94 % 90 % 94 %    Oxygen Saturation (Exit) 94 % 97 % 97 % 92 %    Rating of Perceived Exertion (Exercise) 9 8 9 9     Perceived Dyspnea (Exercise) 9 11 11 12     Duration Progress to 30 minutes of continuous aerobic without signs/symptoms of physical distress Progress to 30 minutes of continuous aerobic without signs/symptoms of physical distress Progress to 30 minutes of continuous aerobic without signs/symptoms of physical distress Progress to 30 minutes of continuous aerobic without signs/symptoms of physical distress    Intensity Rest + 20 Rest + 20 Rest + 20 Rest + 20      Progression   Progression Continue progressive overload as per policy without signs/symptoms or physical distress. Continue progressive overload as per policy without signs/symptoms or physical distress. Continue progressive overload as per policy without signs/symptoms or physical distress. Continue progressive overload as per policy without signs/symptoms or physical distress.      Resistance Training   Training Prescription Yes Yes Yes Yes    Weight 3 4 5 5     Reps 10-12 10-12 10-12 10-12      Treadmill   MPH 1.6 1.8 1.9 1.9    Grade 0 0 0 0    Minutes 15 15 15 15     METs 2.2 2.3 2.4 2.4      Arm Ergometer   Level 2.6 2.8 2.9 3.3    Watts 8 30 30  38    Minutes 20 20 20 20     METs 2.6 3.2 3.1 3.8      Home Exercise Plan   Plans to continue exercise at [facility]    Frequency Add 2 additional days to program  exercise sessions. Add 2 additional days to program exercise sessions. Add 2 additional days to program exercise sessions. Add 2 additional days to program exercise sessions.       Exercise Comments:      Exercise Comments    Row Name 03/09/16 1223 04/08/16 1510 05/10/16 0849 06/08/16 1251     Exercise Comments Patient is proggressing well. Patient is progressing appropriately  Patient is proggressing appropriately Patient is progressing well       Discharge Exercise Prescription (Final Exercise Prescription Changes):     Exercise Prescription Changes - 06/08/16 1200      Exercise Review   Progression Yes     Response to Exercise   Blood Pressure (Admit) 114/60   Blood Pressure (Exercise) 130/68   Blood Pressure (Exit) 132/70   Heart Rate (Admit) 76 bpm   Heart Rate (Exercise) 88 bpm   Heart Rate (Exit) 87 bpm   Oxygen Saturation (Admit) 97 %   Oxygen Saturation (Exercise) 94 %   Oxygen Saturation (Exit) 92 %   Rating of Perceived Exertion (Exercise) 9   Perceived Dyspnea (Exercise) 12   Duration Progress to 30 minutes of continuous aerobic without signs/symptoms of physical distress   Intensity Rest + 20     Progression   Progression Continue progressive overload as per policy without signs/symptoms or physical distress.     Resistance Training   Training Prescription Yes   Weight 5   Reps 10-12     Treadmill   MPH 1.9   Grade 0   Minutes 15  METs 2.4     Arm Ergometer   Level 3.3   Watts 38   Minutes 20   METs 3.8     Home Exercise Plan   Plans to continue exercise at Home   Frequency Add 2 additional days to program exercise sessions.      Nutrition:  Target Goals: Understanding of nutrition guidelines, daily intake of sodium 1500mg , cholesterol 200mg , calories 30% from fat and 7% or less from saturated fats, daily to have 5 or more servings of fruits and vegetables.  Biometrics:     Pre Biometrics - 02/26/16 1037      Pre Biometrics    Height 5\' 9"  (1.753 m)   Weight 236 lb 12.4 oz (107.4 kg)   Waist Circumference 44.5 inches   Hip Circumference 42.5 inches   Waist to Hip Ratio 1.05 %   BMI (Calculated) 35   Triceps Skinfold 6 mm   % Body Fat 28.6 %   Grip Strength 81.33 kg   Flexibility 0 in   Single Leg Stand 3 seconds       Nutrition Therapy Plan and Nutrition Goals:   Nutrition Discharge: Rate Your Plate Scores:     Nutrition Assessments - 02/26/16 1344      Rate Your Plate Scores   Pre Score 17      Psychosocial: Target Goals: Acknowledge presence or absence of depression, maximize coping skills, provide positive support system. Participant is able to verbalize types and ability to use techniques and skills needed for reducing stress and depression.  Initial Review & Psychosocial Screening:     Initial Psych Review & Screening - 02/26/16 1402      Initial Review   Current issues with Current Anxiety/Panic  Main concern is being active again w/o SOB.     Family Dynamics   Good Support System? Yes     Barriers   Psychosocial barriers to participate in program Psychosocial barriers identified (see note);The patient should benefit from training in stress management and relaxation.  Though the patients QOL was 23.09, his PHQ-9 was 9. He said he feels depressed and hopeless. He siad it is more based in the fact he can't do anything without getting breathless.       Screening Interventions   Interventions Encouraged to exercise      Quality of Life Scores:     Quality of Life - 02/26/16 1038      Quality of Life Scores   Health/Function Pre 19.5 %   Socioeconomic Pre 24.86 %   Psych/Spiritual Pre 29.14 %   Family Pre 24 %   GLOBAL Pre 23.09 %      PHQ-9: Recent Review Flowsheet Data    Depression screen [facility] 2/9 02/26/2016 11/25/2015 08/25/2015 07/04/2015 06/04/2015   Decreased Interest 3 0 0 0 0   Down, Depressed, Hopeless 3 0 0 0 0   PHQ - 2 Score 6 0 0 0 0   Altered sleeping 0 - - -  -   Tired, decreased energy 3 - - - -   Change in appetite 0 - - - -   Feeling bad or failure about yourself  0 - - - -   Trouble concentrating 0 - - - -   Moving slowly or fidgety/restless 0 - - - -   PHQ-9 Score 9 - - - -   Difficult doing work/chores Not difficult at all - - - -      Psychosocial Evaluation and Intervention:  Psychosocial Evaluation - 02/26/16 1406      Psychosocial Evaluation & Interventions   Interventions Stress management education;Relaxation education;Encouraged to exercise with the program and follow exercise prescription   Continued Psychosocial Services Needed Yes  The patient does not feel he needs counseling. From my observation he would benefit from consulting with a counselor.       Psychosocial Re-Evaluation:     Psychosocial Re-Evaluation    Row Name 03/17/16 1441 04/14/16 1501 05/12/16 1339 06/10/16 0806       Psychosocial Re-Evaluation   Interventions Encouraged to attend Pulmonary Rehabilitation for the exercise Encouraged to attend Pulmonary Rehabilitation for the exercise Encouraged to attend Pulmonary Rehabilitation for the exercise;Stress management education Encouraged to attend Pulmonary Rehabilitation for the exercise    Comments Patient's QOL score was 23.09. He says he is not depressed and does not need counseling.  Patient continues to have no psychosicoal issues identified.  Patient says he does feel depressed at times d/t his SOB. He believes if he could get his strength back, he would feel better mentally. Will continue to monitor.  Patient continue to have no psychosocial issues identified.     Continued Psychosocial Services Needed No No No No       Education: Education Goals: Education classes will be provided on a weekly basis, covering required topics. Participant will state understanding/return demonstration of topics presented.  Learning Barriers/Preferences:     Learning Barriers/Preferences - 02/26/16 1142       Learning Barriers/Preferences   Learning Barriers None   Learning Preferences Individual Instruction;Skilled Demonstration;Group Instruction      Education Topics: How Lungs Work and Diseases: - Discuss the anatomy of the lungs and diseases that can affect the lungs, such as COPD. Flowsheet Row PULMONARY REHAB OTHER RESPIRATORY from 06/03/2016 in [facility]  Date  03/04/16  Educator  CG  Instruction Review Code  2- meets goals/outcomes      Exercise: -Discuss the importance of exercise, FITT principles of exercise, normal and abnormal responses to exercise, and how to exercise safely. Flowsheet Row PULMONARY REHAB OTHER RESPIRATORY from 06/03/2016 in [facility]  Date  03/11/16  Educator  [place]  Instruction Review Code  2- meets goals/outcomes      Environmental Irritants: -Discuss types of environmental irritants and how to limit exposure to environmental irritants. Flowsheet Row PULMONARY REHAB OTHER RESPIRATORY from 06/03/2016 in [facility]  Date  03/18/16  Educator  Nils Flack  Instruction Review Code  2- meets goals/outcomes      Meds/Inhalers and oxygen: - Discuss respiratory medications, definition of an inhaler and oxygen, and the proper way to use an inhaler and oxygen.   Energy Saving Techniques: - Discuss methods to conserve energy and decrease shortness of breath when performing activities of daily living.  Flowsheet Row PULMONARY REHAB OTHER RESPIRATORY from 06/03/2016 in [facility]  Date  04/01/16  Educator  Suzanne Boron  Instruction Review Code  2- meets goals/outcomes      Bronchial Hygiene / Breathing Techniques: - Discuss breathing mechanics, pursed-lip breathing technique,  proper posture, effective ways to clear airways, and other functional breathing techniques Flowsheet Row PULMONARY REHAB OTHER RESPIRATORY from 06/03/2016 in [facility]  Date   04/08/16  Educator  Nils Flack   Instruction Review Code  2- meets goals/outcomes      Cleaning Equipment: - Provides group verbal and written instruction about the health risks of elevated stress, cause of  high stress, and healthy ways to reduce stress.   Nutrition I: Fats: - Discuss the types of cholesterol, what cholesterol does to the body, and how cholesterol levels can be controlled. Flowsheet Row PULMONARY REHAB OTHER RESPIRATORY from 06/03/2016 in [facility]  Date  04/15/16  Educator  Nils Flack  Instruction Review Code  2- meets goals/outcomes      Nutrition II: Labels: -Discuss the different components of food labels and how to read food labels. Flowsheet Row PULMONARY REHAB OTHER RESPIRATORY from 06/03/2016 in [facility]  Date  04/29/16  Educator  Suzanne Boron  Instruction Review Code  2- meets goals/outcomes      Respiratory Infections: - Discuss the signs and symptoms of respiratory infections, ways to prevent respiratory infections, and the importance of seeking medical treatment when having a respiratory infection.   Stress I: Signs and Symptoms: - Discuss the causes of stress, how stress may lead to anxiety and depression, and ways to limit stress.   Stress II: Relaxation: -Discuss relaxation techniques to limit stress.   Oxygen for Home/Travel: - Discuss how to prepare for travel when on oxygen and proper ways to transport and store oxygen to ensure safety. Flowsheet Row PULMONARY REHAB OTHER RESPIRATORY from 06/03/2016 in [facility]  Date  05/27/16  Educator  [place]  Instruction Review Code  2- meets goals/outcomes      Knowledge Questionnaire Score:     Knowledge Questionnaire Score - 02/26/16 1335      Knowledge Questionnaire Score   Pre Score 10/14      Core Components/Risk Factors/Patient Goals at Admission:     Personal Goals and Risk Factors at Admission - 02/26/16 1344       Core Components/Risk Factors/Patient Goals on Admission    Weight Management Yes   Intervention Weight Management/Obesity: Establish reasonable short term and long term weight goals.   Admit Weight 237 lb (107.5 kg)   Goal Weight: Short Term 227 lb (103 kg)   Goal Weight: Long Term 217 lb (98.4 kg)   Expected Outcomes Short Term: Continue to assess and modify interventions until short term weight is achieved;Long Term: Adherence to nutrition and physical activity/exercise program aimed toward attainment of established weight goal   Sedentary Yes   Intervention Provide advice, education, support and counseling about physical activity/exercise needs.;Develop an individualized exercise prescription for aerobic and resistive training based on initial evaluation findings, risk stratification, comorbidities and participant's personal goals.   Expected Outcomes Achievement of increased cardiorespiratory fitness and enhanced flexibility, muscular endurance and strength shown through measurements of functional capacity and personal statement of participant.   Increase Strength and Stamina Yes   Intervention Provide advice, education, support and counseling about physical activity/exercise needs.;Develop an individualized exercise prescription for aerobic and resistive training based on initial evaluation findings, risk stratification, comorbidities and participant's personal goals.   Expected Outcomes Achievement of increased cardiorespiratory fitness and enhanced flexibility, muscular endurance and strength shown through measurements of functional capacity and personal statement of participant.   Improve shortness of breath with ADL's Yes   Intervention Provide education, individualized exercise plan and daily activity instruction to help decrease symptoms of SOB with activities of daily living.   Expected Outcomes Short Term: Achieves a reduction of symptoms when performing activities of daily living.    Develop more efficient breathing techniques such as purse lipped breathing and diaphragmatic breathing; and practicing self-pacing with activity Yes   Intervention Provide education, demonstration and support about  specific breathing techniuqes utilized for more efficient breathing. Include techniques such as pursed lipped breathing, diaphragmatic breathing and self-pacing activity.   Expected Outcomes Short Term: Participant will be able to demonstrate and use breathing techniques as needed throughout daily activities.   Diabetes Yes   Intervention Provide education about signs/symptoms and action to take for hypo/hyperglycemia.   Expected Outcomes Long Term: Attainment of HbA1C < 7%.;Short Term: Participant verbalizes understanding of the signs/symptoms and immediate care of hyper/hypoglycemia, proper foot care and importance of medication, aerobic/resistive exercise and nutrition plan for blood glucose control.   Stress Yes   Intervention Offer individual and/or small group education and counseling on adjustment to heart disease, stress management and health-related lifestyle change. Teach and support self-help strategies.   Expected Outcomes Short Term: Participant demonstrates changes in health-related behavior, relaxation and other stress management skills, ability to obtain effective social support, and compliance with psychotropic medications if prescribed.;Long Term: Emotional wellbeing is indicated by absence of clinically significant psychosocial distress or social isolation.   Personal Goal Other Yes   Personal Goal Lose weight 10-20lbs before graduation. then continue to lose up to 50lbs. Breath better.   Intervention Attend Pulmonary Rehab 2 x week and then supplement exercise at home with 3 x week.    Expected Outcomes Reach personal goals.       Core Components/Risk Factors/Patient Goals Review:      Goals and Risk Factor Review    Row Name 02/26/16 1402 03/17/16 1440 04/14/16  1459 05/12/16 1335 06/10/16 0804     Core Components/Risk Factors/Patient Goals Review   Personal Goals Review Weight Management/Obesity;Improve shortness of breath with ADL's;Develop more efficient breathing techniques such as purse lipped breathing and diaphragmatic breathing and practicing self-pacing with activity.;Diabetes;Stress Weight Management/Obesity;Increase Strength and Stamina;Improve shortness of breath with ADL's;Develop more efficient breathing techniques such as purse lipped breathing and diaphragmatic breathing and practicing self-pacing with activity.;Diabetes;Stress Weight Management/Obesity;Sedentary;Improve shortness of breath with ADL's;Increase Strength and Stamina;Develop more efficient breathing techniques such as purse lipped breathing and diaphragmatic breathing and practicing self-pacing with activity.;Stress;Increase knowledge of respiratory medications and ability to use respiratory devices properly.  Lose weight 10-20lbs before graduation. then continue to lose up to 50lbs. Breath better. Weight Management/Obesity;Increase Strength and Stamina;Improve shortness of breath with ADL's;Diabetes;Stress;Develop more efficient breathing techniques such as purse lipped breathing and diaphragmatic breathing and practicing self-pacing with activity.  Lose weight 10-20lbs before graduation. then continue to lose up to 50lbs. Breath better. Weight Management/Obesity;Sedentary;Increase Strength and Stamina;Develop more efficient breathing techniques such as purse lipped breathing and diaphragmatic breathing and practicing self-pacing with activity.;Stress;Improve shortness of breath with ADL's;Increase knowledge of respiratory medications and ability to use respiratory devices properly.  Lose 10-20 lbs before graduation; continue to lose 50 lbs. Breathe better.    Review  - Patient new to program. He has attended 6 sessions. Will continue to monitor for progress.  Patient has attended 12  sessions losing 2 lbs. He is doing well in the program with some increased strength and stamina and some improvement in his SOB.  Patient has attended 15 sessions. He has maintained his weight. He missed the last 3 sessions d/t illness. He is progressing well with improvement in his strength, stamina and SOB.  Patient has attended 19 sessions. He has gained 3 lbs not meeting his weight loss goals. He is progressing in the program with increased strength and stamina and improved SOB. He says the program is helping and he is more active.    Expected  Outcomes  - Patient will complete the program meeting his personal goals.  Patient will continue to attend sessions meeting his personal goals.  Patient will continue to attend sessions and complete the program meeting his personal goals.  Patient will continue to attend sessions meeting his personal goals.       Core Components/Risk Factors/Patient Goals at Discharge (Final Review):      Goals and Risk Factor Review - 06/10/16 0804      Core Components/Risk Factors/Patient Goals Review   Personal Goals Review Weight Management/Obesity;Sedentary;Increase Strength and Stamina;Develop more efficient breathing techniques such as purse lipped breathing and diaphragmatic breathing and practicing self-pacing with activity.;Stress;Improve shortness of breath with ADL's;Increase knowledge of respiratory medications and ability to use respiratory devices properly.  Lose 10-20 lbs before graduation; continue to lose 50 lbs. Breathe better.    Review Patient has attended 19 sessions. He has gained 3 lbs not meeting his weight loss goals. He is progressing in the program with increased strength and stamina and improved SOB. He says the program is helping and he is more active.    Expected Outcomes Patient will continue to attend sessions meeting his personal goals.       ITP Comments:   Comments: ITP 30 Day REVIEW Pt is making expected progress toward pulmonary  rehab goals after completing 19 sessions. Recommend continued exercise, life style modification, education, and utilization of breathing techniques to increase stamina and strength and decrease shortness of breath with exertion.

## 2016-06-11 ENCOUNTER — Encounter: Payer: Self-pay | Admitting: *Deleted

## 2016-06-14 ENCOUNTER — Ambulatory Visit (INDEPENDENT_AMBULATORY_CARE_PROVIDER_SITE_OTHER): Payer: Medicare Other | Admitting: Otolaryngology

## 2016-06-14 DIAGNOSIS — R040 Epistaxis: Secondary | ICD-10-CM

## 2016-06-15 ENCOUNTER — Encounter (HOSPITAL_COMMUNITY): Payer: Medicare Other

## 2016-06-15 DIAGNOSIS — E230 Hypopituitarism: Secondary | ICD-10-CM | POA: Diagnosis not present

## 2016-06-17 ENCOUNTER — Encounter (HOSPITAL_COMMUNITY)
Admission: RE | Admit: 2016-06-17 | Discharge: 2016-06-17 | Disposition: A | Discharge status: Home / Self Care | Payer: Medicare Other | Source: Ambulatory Visit | Attending: Cardiology | Admitting: Cardiology

## 2016-06-17 ENCOUNTER — Telehealth: Payer: Self-pay | Admitting: Internal Medicine

## 2016-06-17 DIAGNOSIS — J45909 Unspecified asthma, uncomplicated: Secondary | ICD-10-CM | POA: Diagnosis not present

## 2016-06-17 DIAGNOSIS — R0602 Shortness of breath: Secondary | ICD-10-CM | POA: Diagnosis not present

## 2016-06-17 NOTE — Telephone Encounter (Signed)
Ok to hold plavix for a few says, say a week, and see if nose bleeds resolve   J Season Astacio MD

## 2016-06-17 NOTE — Telephone Encounter (Signed)
Pt's wife called stating the pt has been having nose bleeds every night and Dr. Melene Plan would like to know if it will be ok for the pt to go off his clopidogrel (PLAVIX) 75 MG tablet [id number] for a few days. Please give her a call @ [phone]

## 2016-06-17 NOTE — Telephone Encounter (Signed)
Pt aware.

## 2016-06-17 NOTE — Telephone Encounter (Signed)
Routed to Dr. Branch

## 2016-06-17 NOTE — Progress Notes (Signed)
Daily Session Note  Patient Details  Name: Lance [name] MRN: [id number] Date of Birth: 01/24/1946 Referring Provider:   Flowsheet Row PULMONARY REHAB OTHER RESP ORIENTATION from 02/26/2016 in [place] [facility]  Referring Provider  Dr. Branch      Encounter Date: 06/17/2016  Check In:     Session Check In - 06/17/16 1045      Check-In   Location [facility]   Staff Present Hiroshi Krummel, BS, EP, Exercise Physiologist   Supervising physician immediately available to respond to emergencies See telemetry face sheet for immediately available MD   Medication changes reported     No   Fall or balance concerns reported    No   Warm-up and Cool-down Performed as group-led instruction   Resistance Training Performed Yes   VAD Patient? No     Pain Assessment   Currently in Pain? No/denies   Pain Score 0-No pain   Multiple Pain Sites No      Capillary Blood Glucose: No results found for this or any previous visit (from the past 24 hour(s)).   Goals Met:  Independence with exercise equipment Improved SOB with ADL's Using PLB without cueing & demonstrates good technique Exercise tolerated well No report of cardiac concerns or symptoms Strength training completed today  Goals Unmet:  Not Applicable  Comments: Check out 1145   Dr. Edward Hawkins is Medical Director for [facility]. 

## 2016-06-18 ENCOUNTER — Other Ambulatory Visit: Payer: Self-pay | Admitting: Cardiology

## 2016-06-18 DIAGNOSIS — E1142 Type 2 diabetes mellitus with diabetic polyneuropathy: Secondary | ICD-10-CM | POA: Diagnosis not present

## 2016-06-18 DIAGNOSIS — B351 Tinea unguium: Secondary | ICD-10-CM | POA: Diagnosis not present

## 2016-06-22 ENCOUNTER — Encounter (HOSPITAL_COMMUNITY)
Admission: RE | Admit: 2016-06-22 | Discharge: 2016-06-22 | Disposition: A | Discharge status: Home / Self Care | Payer: Medicare Other | Source: Ambulatory Visit | Attending: Cardiology | Admitting: Cardiology

## 2016-06-22 DIAGNOSIS — J45909 Unspecified asthma, uncomplicated: Secondary | ICD-10-CM | POA: Diagnosis not present

## 2016-06-22 DIAGNOSIS — R0602 Shortness of breath: Secondary | ICD-10-CM | POA: Diagnosis not present

## 2016-06-22 NOTE — Progress Notes (Signed)
Daily Session Note  Patient Details  Name: Lance [name] MRN: [id number] Date of Birth: Jul 24, 1945 Referring Provider:   Flowsheet Row PULMONARY REHAB OTHER RESP ORIENTATION from 02/26/2016 in [place]  Referring Provider  Dr. Harl Bowie      Encounter Date: 06/22/2016  Check In:     Session Check In - 06/22/16 1045      Check-In   Location [facility]   Staff Present Diane Angelina Pih, MS, EP, [facility], Exercise Physiologist;Adabelle Griffiths Luther Parody, BS, EP, Exercise Physiologist   Supervising physician immediately available to respond to emergencies See telemetry face sheet for immediately available MD   Medication changes reported     No   Fall or balance concerns reported    No   Warm-up and Cool-down Performed as group-led instruction   Resistance Training Performed Yes   VAD Patient? No     Pain Assessment   Currently in Pain? No/denies   Pain Score 0-No pain   Multiple Pain Sites No      Capillary Blood Glucose: No results found for this or any previous visit (from the past 24 hour(s)).   Goals Met:  Independence with exercise equipment Improved SOB with ADL's Using PLB without cueing & demonstrates good technique Exercise tolerated well No report of cardiac concerns or symptoms Strength training completed today  Goals Unmet:  Not Applicable  Comments: Check out 1145   Dr. Sinda Du is Medical Director for [facility].

## 2016-06-24 ENCOUNTER — Encounter: Payer: Self-pay | Admitting: Internal Medicine

## 2016-06-24 ENCOUNTER — Encounter (HOSPITAL_COMMUNITY)
Admission: RE | Admit: 2016-06-24 | Discharge: 2016-06-24 | Disposition: A | Discharge status: Home / Self Care | Payer: Medicare Other | Source: Ambulatory Visit | Attending: Cardiology | Admitting: Cardiology

## 2016-06-24 DIAGNOSIS — J45909 Unspecified asthma, uncomplicated: Secondary | ICD-10-CM | POA: Diagnosis not present

## 2016-06-24 DIAGNOSIS — R0602 Shortness of breath: Secondary | ICD-10-CM | POA: Diagnosis not present

## 2016-06-24 NOTE — Progress Notes (Signed)
Daily Session Note  Patient Details  Name: Lance [name] MRN: [id number] Date of Birth: 10-05-1945 Referring Provider:   Flowsheet Row PULMONARY REHAB OTHER RESP ORIENTATION from 02/26/2016 in [place]  Referring Provider  Dr. Harl Bowie      Encounter Date: 06/24/2016  Check In:     Session Check In - 06/24/16 1045      Check-In   Location [facility]   Staff Present Aundra Dubin, RN, BSN;Stevana Dufner Luther Parody, BS, EP, Exercise Physiologist   Supervising physician immediately available to respond to emergencies See telemetry face sheet for immediately available MD   Medication changes reported     No   Fall or balance concerns reported    No   Warm-up and Cool-down Performed as group-led instruction   Resistance Training Performed Yes   VAD Patient? No     Pain Assessment   Currently in Pain? No/denies   Pain Score 0-No pain   Multiple Pain Sites No      Capillary Blood Glucose: No results found for this or any previous visit (from the past 24 hour(s)).   Goals Met:  Independence with exercise equipment Improved SOB with ADL's Using PLB without cueing & demonstrates good technique Exercise tolerated well No report of cardiac concerns or symptoms Strength training completed today  Goals Unmet:  Not Applicable  Comments: Check out 1145   Dr. Sinda Du is Medical Director for [facility].

## 2016-06-29 ENCOUNTER — Encounter (HOSPITAL_COMMUNITY): Admission: RE | Admit: 2016-06-29 | Payer: Medicare Other | Source: Ambulatory Visit

## 2016-06-29 DIAGNOSIS — E291 Testicular hypofunction: Secondary | ICD-10-CM | POA: Diagnosis not present

## 2016-07-01 ENCOUNTER — Encounter (HOSPITAL_COMMUNITY)
Admission: RE | Admit: 2016-07-01 | Discharge: 2016-07-01 | Disposition: A | Discharge status: Home / Self Care | Payer: Medicare Other | Source: Ambulatory Visit | Attending: Cardiology | Admitting: Cardiology

## 2016-07-01 DIAGNOSIS — J45909 Unspecified asthma, uncomplicated: Secondary | ICD-10-CM

## 2016-07-01 DIAGNOSIS — R0602 Shortness of breath: Secondary | ICD-10-CM | POA: Diagnosis not present

## 2016-07-01 NOTE — Progress Notes (Signed)
Daily Session Note  Patient Details  Name: Lance [name] MRN: [id number] Date of Birth: Oct 06, 1945 Referring Provider:   Flowsheet Row PULMONARY REHAB OTHER RESP ORIENTATION from 02/26/2016 in [place]  Referring Provider  Dr. Harl Bowie      Encounter Date: 07/01/2016  Check In:     Session Check In - 07/01/16 1045      Check-In   Location [facility]   Staff Present Suzanne Boron, BS, EP, Exercise Physiologist   Supervising physician immediately available to respond to emergencies See telemetry face sheet for immediately available MD   Medication changes reported     No   Fall or balance concerns reported    No   Warm-up and Cool-down Performed as group-led instruction   Resistance Training Performed Yes   VAD Patient? No     Pain Assessment   Currently in Pain? No/denies   Pain Score 0-No pain   Multiple Pain Sites No      Capillary Blood Glucose: No results found for this or any previous visit (from the past 24 hour(s)).    History  Smoking Status  . Former Smoker  . Packs/day: 3.00  . Years: 40.00  . Types: Cigarettes  . Start date: 05/03/1954  . Quit date: 05/03/1998  Smokeless Tobacco  . Current User  . Types: Chew    Goals Met:  Independence with exercise equipment Improved SOB with ADL's Using PLB without cueing & demonstrates good technique Exercise tolerated well No report of cardiac concerns or symptoms Strength training completed today  Goals Unmet:  Not Applicable  Comments: Check out 1145   Dr. Sinda Du is Medical Director for [facility].

## 2016-07-02 DIAGNOSIS — M4712 Other spondylosis with myelopathy, cervical region: Secondary | ICD-10-CM | POA: Diagnosis not present

## 2016-07-02 DIAGNOSIS — M542 Cervicalgia: Secondary | ICD-10-CM | POA: Diagnosis not present

## 2016-07-02 DIAGNOSIS — M791 Myalgia: Secondary | ICD-10-CM | POA: Diagnosis not present

## 2016-07-06 ENCOUNTER — Ambulatory Visit (INDEPENDENT_AMBULATORY_CARE_PROVIDER_SITE_OTHER): Payer: Medicare Other | Admitting: Nurse Practitioner

## 2016-07-06 ENCOUNTER — Encounter: Payer: Self-pay | Admitting: Nurse Practitioner

## 2016-07-06 VITALS — BP 166/93 | HR 66 | Ht 69.0 in | Wt 239.0 lb

## 2016-07-06 DIAGNOSIS — R413 Other amnesia: Secondary | ICD-10-CM

## 2016-07-06 MED ORDER — DONEPEZIL HCL 10 MG PO TABS: 10.0000 mg | ORAL_TABLET | Freq: Every day | ORAL | 3 refills | Status: DC
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
## 2016-07-06 NOTE — Patient Instructions (Signed)
Continue Aricept at current dose will refill Continue exercise cardiac rehab Encourage good nutrition and hydration F/U in 6 months next with Dr. Jannifer Franklin

## 2016-07-06 NOTE — Progress Notes (Signed)
I have read the note, and I agree with the clinical assessment and plan.  Layney Gillson KEITH   

## 2016-07-06 NOTE — Progress Notes (Signed)
Pulmonary Individual Treatment Plan  Patient Details  Name: Lance [name] MRN: [id number] Date of Birth: 15-Mar-1946 Referring Provider:   Flowsheet Row PULMONARY REHAB OTHER RESP ORIENTATION from 02/26/2016 in [place]  Referring Provider  Dr. Harl Bowie      Initial Encounter Date:  Flowsheet Row PULMONARY REHAB OTHER RESP ORIENTATION from 02/26/2016 in [facility]  Date  02/26/16  Referring Provider  Dr. Harl Bowie      Visit Diagnosis: SOB (shortness of breath)  Asthma in adult without complication, unspecified asthma severity, unspecified whether persistent  Patient's Home Medications on Admission:   Current Outpatient Prescriptions:  .  albuterol (PROVENTIL HFA;VENTOLIN HFA) 108 (90 BASE) MCG/ACT inhaler, Inhale 2 puffs into the lungs every 6 (six) hours as needed for wheezing., Disp: 1 Inhaler, Rfl: 2 .  albuterol (PROVENTIL) (2.5 MG/3ML) 0.083% nebulizer solution, Take 3 mLs (2.5 mg total) by nebulization every 6 (six) hours as needed for wheezing or shortness of breath., Disp: 150 mL, Rfl: 0 .  ALPRAZolam (XANAX) 1 MG tablet, TAKE 1/2-1 TABLET 2 TIMES A DAY AS NEEDED (Patient taking differently: TAKE 1/2-1 TABLET (0.5MG  TO 1 MG) 2 TIMES A DAY AS NEEDED FOR ANXIETY), Disp: 60 tablet, Rfl: 3 .  aspirin EC 81 MG tablet, Take 81 mg by mouth daily., Disp: , Rfl:  .  atorvastatin (LIPITOR) 80 MG tablet, TAKE ONE (1) TABLET EACH DAY, Disp: 90 tablet, Rfl: 0 .  baclofen (LIORESAL) 10 MG tablet, , Disp: , Rfl:  .  budesonide-formoterol (SYMBICORT) 160-4.5 MCG/ACT inhaler, Inhale 2 puffs into the lungs 2 (two) times daily., Disp: 1 Inhaler, Rfl: 3 .  cephALEXin (KEFLEX) 500 MG capsule, Take 1 capsule (500 mg total) by mouth 4 (four) times daily., Disp: 30 capsule, Rfl: 0 .  clopidogrel (PLAVIX) 75 MG tablet, TAKE ONE TABLET EVERY MORNING WITH BREAKFAST, Disp: 30 tablet, Rfl: 5 .  dicyclomine (BENTYL) 10 MG capsule, TAKE ONE CAPSULE EVERY MORNING  AND ONE CAPSULE AT BEDTIME AS NEEDED FOR INCREASED STOOLS AND BLOATING (Patient taking differently: Take 10 mg by mouth 2 (two) times daily as needed (for IBS (diarrhea issues)). ), Disp: 60 capsule, Rfl: 5 .  donepezil (ARICEPT) 10 MG tablet, Take 1 tablet (10 mg total) by mouth at bedtime., Disp: 90 tablet, Rfl: 3 .  DULoxetine (CYMBALTA) 60 MG capsule, TAKE ONE (1) CAPSULE EACH DAY, Disp: 30 capsule, Rfl: 5 .  empagliflozin (JARDIANCE) 25 MG TABS tablet, Take 25 mg by mouth daily., Disp: , Rfl:  .  fexofenadine (ALLEGRA) 180 MG tablet, Take 180 mg by mouth daily as needed for allergies. , Disp: , Rfl:  .  furosemide (LASIX) 20 MG tablet, Take 1 tablet (20 mg total) by mouth daily., Disp: 90 tablet, Rfl: 3 .  gabapentin (NEURONTIN) 300 MG capsule, TAKE ONE TO TWO CAPSULES BY MOUTH THREE TIMES AS DAY AS NEEDED AS NEEDED (Patient taking differently: TAKE ONE CAPSULE (300 MG) EVERY MORNING & TWO CAPSULES (600 MG ) AT BEDTIME), Disp: 180 capsule, Rfl: 6 .  glucose blood (ONE TOUCH ULTRA TEST) test strip, Use as instructed 4 times daily. E11.65, Disp: 150 each, Rfl: 1 .  guaiFENesin (MUCINEX) 600 MG 12 hr tablet, Take 1,200 mg by mouth 2 (two) times daily. , Disp: , Rfl:  .  HUMALOG KWIKPEN 100 UNIT/ML KiwkPen, Inject 0.32-0.38 mLs (32-38 Units total) into the skin 3 (three) times daily. (Patient taking differently: Inject 22-26 Units into the skin 3 (three) times daily before meals. ),  Disp: 30 pen, Rfl: 2 .  HYDROcodone-acetaminophen (LORTAB) 7.5-500 MG tablet, Take 1 tablet by mouth every 6 (six) hours as needed for pain., Disp: , Rfl:  .  Insulin Detemir (LEVEMIR FLEXTOUCH) 100 UNIT/ML Pen, Inject 90 Units into the skin daily at 10 pm. (Patient taking differently: Inject 100 Units into the skin at bedtime. ), Disp: 30 pen, Rfl: 2 .  losartan (COZAAR) 25 MG tablet, Take 1 tablet (25 mg total) by mouth daily., Disp: 30 tablet, Rfl: 11 .  magnesium oxide (MAG-OX) 400 MG tablet, TAKE ONE TABLET ONCE DAILY,  Disp: 30 tablet, Rfl: 11 .  metoprolol tartrate (LOPRESSOR) 25 MG tablet, Take 1 tablet (25 mg total) by mouth 2 (two) times daily., Disp: 60 tablet, Rfl: 11 .  nitroGLYCERIN (NITROSTAT) 0.4 MG SL tablet, Place 1 tablet (0.4 mg total) under the tongue every 5 (five) minutes as needed. For chest pain (Patient taking differently: Place 0.4 mg under the tongue every 5 (five) minutes as needed. For chest pain), Disp: 25 tablet, Rfl: 3 .  olopatadine (PATANOL) 0.1 % ophthalmic solution, Place 1 drop into both eyes 2 (two) times daily. (Patient taking differently: Place 1 drop into both eyes 2 (two) times daily as needed (for itchy/irritated eyes). ), Disp: 5 mL, Rfl: 12 .  pantoprazole (PROTONIX) 40 MG tablet, Take 1 tablet (40 mg total) by mouth 2 (two) times daily., Disp: 60 tablet, Rfl: 5 .  polyethylene glycol-electrolytes (TRILYTE) 420 g solution, Take 4,000 mLs by mouth as directed., Disp: 4000 mL, Rfl: 0 .  Probiotic Product (ALIGN PO), Take 1 tablet by mouth daily. , Disp: , Rfl:  .  sildenafil (REVATIO) 20 MG tablet, Take 3 -5 tabs as needed for erectile dysfunction, Disp: 20 tablet, Rfl: 4 .  sulfamethoxazole-trimethoprim (BACTRIM DS,SEPTRA DS) 800-160 MG tablet, Take 1 tablet by mouth 2 (two) times daily., Disp: 20 tablet, Rfl: 0 .  traZODone (DESYREL) 100 MG tablet, TAKE 1/2 TO 1 TABLET AT BEDTIME AS NEEDED FOR SLEEP (Patient taking differently: TAKE 1 TABLET (100 MG) BY MOUTH AT BEDTIME), Disp: 30 tablet, Rfl: 5 .  UNIFINE PENTIPS 31G X 6 MM MISC, USE PEN NEEDLES 4 TIMES A DAY, Disp: 150 each, Rfl: 0 .  vitamin B-12 (CYANOCOBALAMIN) 1000 MCG tablet, Take 1,000 mcg by mouth daily. Reported on 08/25/2015, Disp: , Rfl:  .  Vitamin D, Ergocalciferol, (DRISDOL) 50000 units CAPS capsule, TAKE ONE CAPSULE BY MOUTH ONCE A WEEK (Patient taking differently: TAKE ONE CAPSULE BY MOUTH ONCE A MONTH -3RD OF MONTH), Disp: 12 capsule, Rfl: 0  Past Medical History: Past Medical History:  Diagnosis Date  .  Allergic rhinitis, cause unspecified   . Anxiety and depression   . ASCVD (arteriosclerotic cardiovascular disease)    70% mid left anterior descending lesion on cath in 06/1995; left anterior desending DES placed in 8/03 and RCA stent in 9/03; captain 3/05 revealed 90% second marginal for which PCI was performed, 70% PDA and a total obstruction of the first diagonal and marginal; sudden cardiac death in [place] in 2003/11/28 for which automatic implantable cardiac defibrillator placed; negativ stress nuclear 10/07  . Atrial fibrillation ([place])   . Benign prostatic hypertrophy   . C. difficile diarrhea   . CAD (coronary artery disease) 1997  . CHF (congestive heart failure) ([place])   . COPD (chronic obstructive pulmonary disease) ([place])   . Coronary artery disease   . CVD (cerebrovascular disease) 05/2008   Transient ischemic attack; carotid ultrasound-plaque without focal disease  .  Degenerative joint disease 2002   C-spine fusion   . Diabetes mellitus    Insulin requirement  . Diabetes mellitus without mention of complication   . Diabetic peripheral neuropathy ([place]) 09/06/2014  . Erectile dysfunction   . Esophageal reflux   . HOH (hard of hearing)   . Hx-TIA (transient ischemic attack) 2010  . Hyperlipidemia   . Hypertension   . ICD (implantable cardiac defibrillator) in place 2005   [place] ICD, for SCD  . Memory deficits 09/05/2013  . Myocardial infarction   . Other testicular hypofunction   . Pacemaker   . S/P endoscopy Dec 2011   RMR: nl esophagus, hyperplastic polyp, active gastritis, no H.pylori.   . Shortness of breath   . Stroke ([place])   . Tobacco abuse    100 pack/year comsuption; cigarettes discontinued 2003; all tobacco products in 2008  . Tubular adenoma   . Vitreous floaters of left eye     Tobacco Use: History  Smoking Status  . Former Smoker  . Packs/day: 3.00  . Years: 40.00  . Types: Cigarettes  . Start date: 05/03/1954  . Quit date: 05/03/1998  Smokeless Tobacco   . Current User  . Types: Chew    Labs: Recent Review Flowsheet Data    Labs for ITP Cardiac and Pulmonary Rehab Latest Ref Rng & Units 07/25/2013 11/06/2014 02/06/2015 05/15/2015 11/21/2015   Cholestrol 125 - 200 mg/dL 154 - - - 125   LDLCALC <130 mg/dL 92 - - - 55   HDL >=40 mg/dL 31(L) - - - 34(L)   Trlycerides <150 mg/dL 155(H) - - - 178(H)   Hemoglobin A1c <5.7 % - 10.2(A) 9.4(A) 9.9(H) 9.0(H)   PHART 7.350 - 7.450 - - - - -   PCO2ART 35.0 - 45.0 mmHg - - - - -   HCO3 20.0 - 24.0 mEq/L - - - - -   TCO2 0 - 100 mmol/L - - - - -   ACIDBASEDEF 0.0 - 2.0 mmol/L - - - - -   O2SAT % - - - - -      Capillary Blood Glucose: Lab Results  Component Value Date   GLUCAP 226 (H) 05/10/2016   GLUCAP 166 (H) 07/09/2015   GLUCAP 287 (H) 07/08/2015   GLUCAP 222 (H) 07/08/2015   GLUCAP 118 (H) 07/08/2015     ADL UCSD:     Pulmonary Assessment Scores    Row Name 02/26/16 1340         ADL UCSD   ADL Phase Entry     SOB Score total 114     Rest 2     Walk 6     Stairs 4     Bath 2     Dress 2     Shop 3       CAT Score   CAT Score 26       mMRC Score   mMRC Score 4        Pulmonary Function Assessment:     Pulmonary Function Assessment - 02/26/16 1337      Pulmonary Function Tests   FVC% 87 %   FEV1% 88 %   FEV1/FVC Ratio 101   RV% 111 %   DLCO% 92 %     Initial Spirometry Results   FVC% 87 %   FEV1% 88 %   FEV1/FVC Ratio 101     Post Bronchodilator Spirometry Results   FVC% 88 %   FEV1% 94 %  FEV1/FVC Ratio 106     Breath   Shortness of Breath Yes;Limiting activity      Exercise Target Goals:    Exercise Program Goal: Individual exercise prescription set with THRR, safety & activity barriers. Participant demonstrates ability to understand and report RPE using BORG scale, to self-measure pulse accurately, and to acknowledge the importance of the exercise prescription.  Exercise Prescription Goal: Starting with aerobic activity 30 plus minutes a  day, 3 days per week for initial exercise prescription. Provide home exercise prescription and guidelines that participant acknowledges understanding prior to discharge.  Activity Barriers & Risk Stratification:     Activity Barriers & Cardiac Risk Stratification - 02/26/16 1141      Activity Barriers & Cardiac Risk Stratification   Activity Barriers Back Problems;Left Knee Replacement;Joint Problems   Cardiac Risk Stratification High      6 Minute Walk:     6 Minute Walk    Row Name 02/26/16 1134         6 Minute Walk   Phase Initial     Distance 950 feet     Distance % Change 0 %     Walk Time 6 minutes     # of Rest Breaks 0     MPH 1.79     METS 2.37     RPE 15     Perceived Dyspnea  14     VO2 Peak 6.7     Symptoms No     Resting HR 69 bpm     Resting BP 130/72     Max Ex. HR 82 bpm     Max Ex. BP 150/76     2 Minute Post BP 138/74        Oxygen Initial Assessment:   Oxygen Re-Evaluation:   Oxygen Discharge (Final Oxygen Re-Evaluation):   Initial Exercise Prescription:     Initial Exercise Prescription - 02/26/16 1000      Date of Initial Exercise RX and Referring Provider   Date 02/26/16   Referring Provider Dr. Harl Bowie     Treadmill   MPH 1   Grade 0   Minutes 15   METs 1.6     Arm Ergometer   Level 2   Watts 15   Minutes 20   METs 1.9     Prescription Details   Frequency (times per week) 3   Duration Progress to 30 minutes of continuous aerobic without signs/symptoms of physical distress     Intensity   THRR REST +  20   THRR 40-80% of Max Heartrate [phone]   Ratings of Perceived Exertion 11-13   Perceived Dyspnea 0-4     Progression   Progression Continue progressive overload as per policy without signs/symptoms or physical distress.     Resistance Training   Training Prescription Yes   Weight 1   Reps 10-12      Perform Capillary Blood Glucose checks as needed.  Exercise Prescription Changes:      Exercise  Prescription Changes    Row Name 03/09/16 1200 04/08/16 1500 05/10/16 0800 06/08/16 1200 07/05/16 1200     Response to Exercise   Blood Pressure (Admit) 128/62 156/80 124/68 114/60 140/78   Blood Pressure (Exercise) 130/68 162/72 154/72 130/68 140/78   Blood Pressure (Exit) 146/70 140/78 120/60 132/70 140/72   Heart Rate (Admit) 70 bpm 74 bpm 62 bpm 76 bpm 77 bpm   Heart Rate (Exercise) 87 bpm 94 bpm 80 bpm 88 bpm 90 bpm  Heart Rate (Exit) 74 bpm 83 bpm 74 bpm 87 bpm 86 bpm   Oxygen Saturation (Admit) 96 % 95 % 95 % 97 % 95 %   Oxygen Saturation (Exercise) 96 % 94 % 90 % 94 % 91 %   Oxygen Saturation (Exit) 94 % 97 % 97 % 92 % 96 %   Rating of Perceived Exertion (Exercise) 9 8 9 9 9    Perceived Dyspnea (Exercise) 9 11 11 12 12    Duration Progress to 30 minutes of continuous aerobic without signs/symptoms of physical distress Progress to 30 minutes of continuous aerobic without signs/symptoms of physical distress Progress to 30 minutes of continuous aerobic without signs/symptoms of physical distress Progress to 30 minutes of continuous aerobic without signs/symptoms of physical distress Progress to 30 minutes of  aerobic without signs/symptoms of physical distress   Intensity Rest + 20 Rest + 20 Rest + 20 Rest + 20 THRR unchanged     Progression   Progression Continue progressive overload as per policy without signs/symptoms or physical distress. Continue progressive overload as per policy without signs/symptoms or physical distress. Continue progressive overload as per policy without signs/symptoms or physical distress. Continue progressive overload as per policy without signs/symptoms or physical distress. Continue to progress workloads to maintain intensity without signs/symptoms of physical distress.     Resistance Training   Training Prescription Yes Yes Yes Yes Yes   Weight 3 4 5 5 5    Reps 10-12 10-12 10-12 10-12 10-15     Treadmill   MPH 1.6 1.8 1.9 1.9 2.1   Grade 0 0 0 0 0    Minutes 15 15 15 15 15    METs 2.2 2.3 2.4 2.4 2.6     Arm Ergometer   Level 2.6 2.8 2.9 3.3 3.1   Watts 8 30 30  38 30   Minutes 20 20 20 20 20    METs 2.6 3.2 3.1 3.8 2.8     Home Exercise Plan   Plans to continue exercise at [facility]  -   Frequency Add 2 additional days to program exercise sessions. Add 2 additional days to program exercise sessions. Add 2 additional days to program exercise sessions. Add 2 additional days to program exercise sessions. Add 2 additional days to program exercise sessions.     Exercise Review   Progression Yes Yes Yes Yes Yes      Exercise Comments:      Exercise Comments    Row Name 03/09/16 1223 04/08/16 1510 05/10/16 0849 06/08/16 1251 07/05/16 1255   Exercise Comments Patient is proggressing well. Patient is progressing appropriately  Patient is proggressing appropriately Patient is progressing well Patient is progressing well and handling the treadmill speecd increase      Exercise Goals and Review:   Exercise Goals Re-Evaluation :     Exercise Goals Re-Evaluation    Row Name 07/06/16 1558             Exercise Goal Re-Evaluation   Exercise Goals Review Increase Strenth and Stamina       Comments Patient is progressing with increased strength and stamina.        Expected Outcomes Patient will continue to progress in the program.           Discharge Exercise Prescription (Final Exercise Prescription Changes):     Exercise Prescription Changes - 07/05/16 1200      Response to Exercise   Blood Pressure (Admit) 140/78   Blood Pressure (Exercise) 140/78  Blood Pressure (Exit) 140/72   Heart Rate (Admit) 77 bpm   Heart Rate (Exercise) 90 bpm   Heart Rate (Exit) 86 bpm   Oxygen Saturation (Admit) 95 %   Oxygen Saturation (Exercise) 91 %   Oxygen Saturation (Exit) 96 %   Rating of Perceived Exertion (Exercise) 9   Perceived Dyspnea (Exercise) 12   Duration Progress to 30 minutes of  aerobic without signs/symptoms  of physical distress   Intensity THRR unchanged     Progression   Progression Continue to progress workloads to maintain intensity without signs/symptoms of physical distress.     Resistance Training   Training Prescription Yes   Weight 5   Reps 10-15     Treadmill   MPH 2.1   Grade 0   Minutes 15   METs 2.6     Arm Ergometer   Level 3.1   Watts 30   Minutes 20   METs 2.8     Home Exercise Plan   Frequency Add 2 additional days to program exercise sessions.     Exercise Review   Progression Yes      Nutrition:  Target Goals: Understanding of nutrition guidelines, daily intake of sodium 1500mg , cholesterol 200mg , calories 30% from fat and 7% or less from saturated fats, daily to have 5 or more servings of fruits and vegetables.  Biometrics:     Pre Biometrics - 02/26/16 1037      Pre Biometrics   Height 5\' 9"  (1.753 m)   Weight 236 lb 12.4 oz (107.4 kg)   Waist Circumference 44.5 inches   Hip Circumference 42.5 inches   Waist to Hip Ratio 1.05 %   BMI (Calculated) 35   Triceps Skinfold 6 mm   % Body Fat 28.6 %   Grip Strength 81.33 kg   Flexibility 0 in   Single Leg Stand 3 seconds       Nutrition Therapy Plan and Nutrition Goals:   Nutrition Discharge: Rate Your Plate Scores:     Nutrition Assessments - 02/26/16 1344      Rate Your Plate Scores   Pre Score 17      Nutrition Goals Re-Evaluation:   Nutrition Goals Discharge (Final Nutrition Goals Re-Evaluation):   Psychosocial: Target Goals: Acknowledge presence or absence of significant depression and/or stress, maximize coping skills, provide positive support system. Participant is able to verbalize types and ability to use techniques and skills needed for reducing stress and depression.  Initial Review & Psychosocial Screening:     Initial Psych Review & Screening - 02/26/16 1402      Initial Review   Current issues with Current Anxiety/Panic  Main concern is being active again  w/o SOB.     Family Dynamics   Good Support System? Yes     Barriers   Psychosocial barriers to participate in program Psychosocial barriers identified (see note);The patient should benefit from training in stress management and relaxation.  Though the patients QOL was 23.09, his PHQ-9 was 9. He said he feels depressed and hopeless. He siad it is more based in the fact he can't do anything without getting breathless.       Screening Interventions   Interventions Encouraged to exercise      Quality of Life Scores:     Quality of Life - 02/26/16 1038      Quality of Life Scores   Health/Function Pre 19.5 %   Socioeconomic Pre 24.86 %   Psych/Spiritual Pre 29.14 %  Family Pre 24 %   GLOBAL Pre 23.09 %      PHQ-9: Recent Review Flowsheet Data    Depression screen [facility] 2/9 02/26/2016 11/25/2015 08/25/2015 07/04/2015 06/04/2015   Decreased Interest 3 0 0 0 0   Down, Depressed, Hopeless 3 0 0 0 0   PHQ - 2 Score 6 0 0 0 0   Altered sleeping 0 - - - -   Tired, decreased energy 3 - - - -   Change in appetite 0 - - - -   Feeling bad or failure about yourself  0 - - - -   Trouble concentrating 0 - - - -   Moving slowly or fidgety/restless 0 - - - -   PHQ-9 Score 9 - - - -   Difficult doing work/chores Not difficult at all - - - -     Interpretation of Total Score  Total Score Depression Severity:  1-4 = Minimal depression, 5-9 = Mild depression, 10-14 = Moderate depression, 15-19 = Moderately severe depression, 20-27 = Severe depression   Psychosocial Evaluation and Intervention:     Psychosocial Evaluation - 02/26/16 1406      Psychosocial Evaluation & Interventions   Interventions Stress management education;Relaxation education;Encouraged to exercise with the program and follow exercise prescription   Continue Psychosocial Services  Yes  The patient does not feel he needs counseling. From my observation he would benefit from consulting with a counselor.        Psychosocial Re-Evaluation:     Psychosocial Re-Evaluation    [place] Name 03/17/16 1441 04/14/16 1501 05/12/16 1339 06/10/16 0806 07/06/16 1600     Psychosocial Re-Evaluation   Current issues with  -  -  -  - -  Was feeling down and depressed at time.    Comments Patient's QOL score was 23.09. He says he is not depressed and does not need counseling.  Patient continues to have no psychosicoal issues identified.  Patient says he does feel depressed at times d/t his SOB. He believes if he could get his strength back, he would feel better mentally. Will continue to monitor.  Patient continue to have no psychosocial issues identified.  Patient is feeling better since his SOB has improved and he is able work.    Expected Outcomes  -  -  -  - Patient's QOL and PHQ-9 scores will improve at dischage.    Interventions Encouraged to attend Pulmonary Rehabilitation for the exercise Encouraged to attend Pulmonary Rehabilitation for the exercise Encouraged to attend Pulmonary Rehabilitation for the exercise;Stress management education Encouraged to attend Pulmonary Rehabilitation for the exercise Encouraged to attend Pulmonary Rehabilitation for the exercise   Continue Psychosocial Services  No No No No Follow up required by staff      Psychosocial Discharge (Final Psychosocial Re-Evaluation):     Psychosocial Re-Evaluation - 07/06/16 1600      Psychosocial Re-Evaluation   Current issues with --  Was feeling down and depressed at time.    Comments Patient is feeling better since his SOB has improved and he is able work.    Expected Outcomes Patient's QOL and PHQ-9 scores will improve at dischage.    Interventions Encouraged to attend Pulmonary Rehabilitation for the exercise   Continue Psychosocial Services  Follow up required by staff       Education: Education Goals: Education classes will be provided on a weekly basis, covering required topics. Participant will state understanding/return  demonstration of topics presented.  Learning  Barriers/Preferences:     Learning Barriers/Preferences - 02/26/16 1142      Learning Barriers/Preferences   Learning Barriers None   Learning Preferences Individual Instruction;Skilled Demonstration;Group Instruction      Education Topics: How Lungs Work and Diseases: - Discuss the anatomy of the lungs and diseases that can affect the lungs, such as COPD. Flowsheet Row PULMONARY REHAB OTHER RESPIRATORY from 07/01/2016 in [place]  Date  (P) 03/04/16  Educator  (P) CG  Instruction Review Code  (P) 2- meets goals/outcomes      Exercise: -Discuss the importance of exercise, FITT principles of exercise, normal and abnormal responses to exercise, and how to exercise safely. Flowsheet Row PULMONARY REHAB OTHER RESPIRATORY from 07/01/2016 in [place]  Date  (P) 03/11/16  Educator  (P) [place]  Instruction Review Code  (P) 2- meets goals/outcomes      Environmental Irritants: -Discuss types of environmental irritants and how to limit exposure to environmental irritants. Flowsheet Row PULMONARY REHAB OTHER RESPIRATORY from 07/01/2016 in [place]  Date  (P) 03/18/16  Educator  (P) Nils Flack  Instruction Review Code  (P) 2- meets goals/outcomes      Meds/Inhalers and oxygen: - Discuss respiratory medications, definition of an inhaler and oxygen, and the proper way to use an inhaler and oxygen. Flowsheet Row PULMONARY REHAB OTHER RESPIRATORY from 07/01/2016 in [place]  Date  (P) 06/24/16  Educator  (P) GC  Instruction Review Code  (P) 2- meets goals/outcomes      Energy Saving Techniques: - Discuss methods to conserve energy and decrease shortness of breath when performing activities of daily living.  Flowsheet Row PULMONARY REHAB OTHER RESPIRATORY from 07/01/2016 in [place]  Date  (P) 04/01/16  Educator  (P) Suzanne Boron  Instruction Review Code  (P) 2- meets goals/outcomes      Bronchial Hygiene / Breathing Techniques: - Discuss breathing mechanics, pursed-lip breathing technique,  proper posture, effective ways to clear airways, and other functional breathing techniques Flowsheet Row PULMONARY REHAB OTHER RESPIRATORY from 07/01/2016 in [place]  Date  (P) 04/08/16  Educator  (P) Nils Flack   Instruction Review Code  (P) 2- meets goals/outcomes      Cleaning Equipment: - Provides group verbal and written instruction about the health risks of elevated stress, cause of high stress, and healthy ways to reduce stress.   Nutrition I: Fats: - Discuss the types of cholesterol, what cholesterol does to the body, and how cholesterol levels can be controlled. Flowsheet Row PULMONARY REHAB OTHER RESPIRATORY from 07/01/2016 in [place]  Date  (P) 04/15/16  Educator  (P) Nils Flack  Instruction Review Code  (P) 2- meets goals/outcomes      Nutrition II: Labels: -Discuss the different components of food labels and how to read food labels. Flowsheet Row PULMONARY REHAB OTHER RESPIRATORY from 07/01/2016 in [place]  Date  (P) 04/29/16  Educator  (P) Suzanne Boron  Instruction Review Code  (P) 2- meets goals/outcomes      Respiratory Infections: - Discuss the signs and symptoms of respiratory infections, ways to prevent respiratory infections, and the importance of seeking medical treatment when having a respiratory infection.   Stress I: Signs and Symptoms: - Discuss the causes of stress, how stress may lead to anxiety and depression, and ways to limit stress.   Stress II: Relaxation: -Discuss relaxation techniques to limit stress.  Oxygen for Home/Travel: - Discuss how to prepare for travel when on oxygen and proper ways to transport and store oxygen to ensure safety. Flowsheet Row PULMONARY REHAB OTHER RESPIRATORY from  07/01/2016 in [facility]  Date  (P) 05/27/16  Educator  (P) [place]  Instruction Review Code  (P) 2- meets goals/outcomes      Knowledge Questionnaire Score:     Knowledge Questionnaire Score - 02/26/16 1335      Knowledge Questionnaire Score   Pre Score 10/14      Core Components/Risk Factors/Patient Goals at Admission:     Personal Goals and Risk Factors at Admission - 02/26/16 1344      Core Components/Risk Factors/Patient Goals on Admission    Weight Management Yes   Intervention Weight Management/Obesity: Establish reasonable short term and long term weight goals.   Admit Weight 237 lb (107.5 kg)   Goal Weight: Short Term 227 lb (103 kg)   Goal Weight: Long Term 217 lb (98.4 kg)   Expected Outcomes Short Term: Continue to assess and modify interventions until short term weight is achieved;Long Term: Adherence to nutrition and physical activity/exercise program aimed toward attainment of established weight goal   Sedentary Yes   Intervention Provide advice, education, support and counseling about physical activity/exercise needs.;Develop an individualized exercise prescription for aerobic and resistive training based on initial evaluation findings, risk stratification, comorbidities and participant's personal goals.   Expected Outcomes Achievement of increased cardiorespiratory fitness and enhanced flexibility, muscular endurance and strength shown through measurements of functional capacity and personal statement of participant.   Increase Strength and Stamina Yes   Intervention Provide advice, education, support and counseling about physical activity/exercise needs.;Develop an individualized exercise prescription for aerobic and resistive training based on initial evaluation findings, risk stratification, comorbidities and participant's personal goals.   Expected Outcomes Achievement of increased cardiorespiratory fitness and enhanced flexibility, muscular  endurance and strength shown through measurements of functional capacity and personal statement of participant.   Improve shortness of breath with ADL's Yes   Intervention Provide education, individualized exercise plan and daily activity instruction to help decrease symptoms of SOB with activities of daily living.   Expected Outcomes Short Term: Achieves a reduction of symptoms when performing activities of daily living.   Develop more efficient breathing techniques such as purse lipped breathing and diaphragmatic breathing; and practicing self-pacing with activity Yes   Intervention Provide education, demonstration and support about specific breathing techniuqes utilized for more efficient breathing. Include techniques such as pursed lipped breathing, diaphragmatic breathing and self-pacing activity.   Expected Outcomes Short Term: Participant will be able to demonstrate and use breathing techniques as needed throughout daily activities.   Diabetes Yes   Intervention Provide education about signs/symptoms and action to take for hypo/hyperglycemia.   Expected Outcomes Long Term: Attainment of HbA1C < 7%.;Short Term: Participant verbalizes understanding of the signs/symptoms and immediate care of hyper/hypoglycemia, proper foot care and importance of medication, aerobic/resistive exercise and nutrition plan for blood glucose control.   Stress Yes   Intervention Offer individual and/or small group education and counseling on adjustment to heart disease, stress management and health-related lifestyle change. Teach and support self-help strategies.   Expected Outcomes Short Term: Participant demonstrates changes in health-related behavior, relaxation and other stress management skills, ability to obtain effective social support, and compliance with psychotropic medications if prescribed.;Long Term: Emotional wellbeing is indicated by absence of clinically significant psychosocial distress or social  isolation.   Personal Goal Other Yes  Personal Goal Lose weight 10-20lbs before graduation. then continue to lose up to 50lbs. Breath better.   Intervention Attend Pulmonary Rehab 2 x week and then supplement exercise at home with 3 x week.    Expected Outcomes Reach personal goals.       Core Components/Risk Factors/Patient Goals Review:      Goals and Risk Factor Review    Row Name 02/26/16 1402 03/17/16 1440 04/14/16 1459 05/12/16 1335 06/10/16 0804     Core Components/Risk Factors/Patient Goals Review   Personal Goals Review Weight Management/Obesity;Improve shortness of breath with ADL's;Develop more efficient breathing techniques such as purse lipped breathing and diaphragmatic breathing and practicing self-pacing with activity.;Diabetes;Stress Weight Management/Obesity;Increase Strength and Stamina;Improve shortness of breath with ADL's;Develop more efficient breathing techniques such as purse lipped breathing and diaphragmatic breathing and practicing self-pacing with activity.;Diabetes;Stress Weight Management/Obesity;Sedentary;Improve shortness of breath with ADL's;Increase Strength and Stamina;Develop more efficient breathing techniques such as purse lipped breathing and diaphragmatic breathing and practicing self-pacing with activity.;Stress;Increase knowledge of respiratory medications and ability to use respiratory devices properly.  Lose weight 10-20lbs before graduation. then continue to lose up to 50lbs. Breath better. Weight Management/Obesity;Increase Strength and Stamina;Improve shortness of breath with ADL's;Diabetes;Stress;Develop more efficient breathing techniques such as purse lipped breathing and diaphragmatic breathing and practicing self-pacing with activity.  Lose weight 10-20lbs before graduation. then continue to lose up to 50lbs. Breath better. Weight Management/Obesity;Sedentary;Increase Strength and Stamina;Develop more efficient breathing techniques such as purse  lipped breathing and diaphragmatic breathing and practicing self-pacing with activity.;Stress;Improve shortness of breath with ADL's;Increase knowledge of respiratory medications and ability to use respiratory devices properly.  Lose 10-20 lbs before graduation; continue to lose 50 lbs. Breathe better.    Review  - Patient new to program. He has attended 6 sessions. Will continue to monitor for progress.  Patient has attended 12 sessions losing 2 lbs. He is doing well in the program with some increased strength and stamina and some improvement in his SOB.  Patient has attended 15 sessions. He has maintained his weight. He missed the last 3 sessions d/t illness. He is progressing well with improvement in his strength, stamina and SOB.  Patient has attended 19 sessions. He has gained 3 lbs not meeting his weight loss goals. He is progressing in the program with increased strength and stamina and improved SOB. He says the program is helping and he is more active.    Expected Outcomes  - Patient will complete the program meeting his personal goals.  Patient will continue to attend sessions meeting his personal goals.  Patient will continue to attend sessions and complete the program meeting his personal goals.  Patient will continue to attend sessions meeting his personal goals.    [place] Name 07/06/16 1555             Core Components/Risk Factors/Patient Goals Review   Personal Goals Review Weight Management/Obesity;Improve shortness of breath with ADL's;Increase knowledge of respiratory medications and ability to use respiratory devices properly.;Develop more efficient breathing techniques such as purse lipped breathing and diaphragmatic breathing and practicing self-pacing with activity.  Lose 10 lbs/month. Lose a total of 50 lbs. Breath better.        Review Patient has completed 24 sessions. He has gained 3 lbs. His SOB has improved. He has returned to work full time without difficulty.        Expected  Outcomes Patient will complete the program. He will start to lose weight and his SOB will continue to  improve.           Core Components/Risk Factors/Patient Goals at Discharge (Final Review):      Goals and Risk Factor Review - 07/06/16 1555      Core Components/Risk Factors/Patient Goals Review   Personal Goals Review Weight Management/Obesity;Improve shortness of breath with ADL's;Increase knowledge of respiratory medications and ability to use respiratory devices properly.;Develop more efficient breathing techniques such as purse lipped breathing and diaphragmatic breathing and practicing self-pacing with activity.  Lose 10 lbs/month. Lose a total of 50 lbs. Breath better.    Review Patient has completed 24 sessions. He has gained 3 lbs. His SOB has improved. He has returned to work full time without difficulty.    Expected Outcomes Patient will complete the program. He will start to lose weight and his SOB will continue to improve.       ITP Comments:   Comments: ITP 30 Day REVIEW Pt is making expected progress toward pulmonary rehab goals after completing 24 sessions. Recommend continued exercise, life style modification, education, and utilization of breathing techniques to increase stamina and strength and decrease shortness of breath with exertion.

## 2016-07-08 ENCOUNTER — Encounter: Payer: Self-pay | Admitting: Internal Medicine

## 2016-07-08 ENCOUNTER — Ambulatory Visit (INDEPENDENT_AMBULATORY_CARE_PROVIDER_SITE_OTHER): Payer: Medicare Other | Admitting: Internal Medicine

## 2016-07-08 DIAGNOSIS — I255 Ischemic cardiomyopathy: Secondary | ICD-10-CM

## 2016-07-08 DIAGNOSIS — I2589 Other forms of chronic ischemic heart disease: Secondary | ICD-10-CM

## 2016-07-08 DIAGNOSIS — I5022 Chronic systolic (congestive) heart failure: Secondary | ICD-10-CM | POA: Diagnosis not present

## 2016-07-08 LAB — CUP PACEART INCLINIC DEVICE CHECK
Brady Statistic RA Percent Paced: 0.07 %
Brady Statistic RV Percent Paced: 0.16 %
Date Time Interrogation Session: 20180308120103
HighPow Impedance: 63 Ohm
Implantable Lead Implant Date: 20050706
Implantable Lead Implant Date: 20161128
Implantable Lead Location: 753859
Implantable Lead Location: 753860
Implantable Lead Model: 7122
Implantable Pulse Generator Implant Date: 20130727
Lead Channel Impedance Value: 400 Ohm
Lead Channel Impedance Value: 575 Ohm
Lead Channel Pacing Threshold Amplitude: 0.75 V
Lead Channel Pacing Threshold Amplitude: 0.75 V
Lead Channel Pacing Threshold Amplitude: 0.75 V
Lead Channel Pacing Threshold Amplitude: 0.75 V
Lead Channel Pacing Threshold Pulse Width: 0.5 ms
Lead Channel Pacing Threshold Pulse Width: 0.5 ms
Lead Channel Pacing Threshold Pulse Width: 0.5 ms
Lead Channel Pacing Threshold Pulse Width: 0.5 ms
Lead Channel Sensing Intrinsic Amplitude: 11.4 mV
Lead Channel Sensing Intrinsic Amplitude: 3 mV
Lead Channel Setting Pacing Amplitude: 2 V
Lead Channel Setting Pacing Amplitude: 2.5 V
Lead Channel Setting Pacing Pulse Width: 0.5 ms
Lead Channel Setting Sensing Sensitivity: 0.5 mV
Pulse Gen Serial Number: 7041246

## 2016-07-08 NOTE — Patient Instructions (Signed)
Your physician wants you to follow-up in: 1 Year with Dr. Lovena Le. You will receive a reminder letter in the mail two months in advance. If you don't receive a letter, please call our office to schedule the follow-up appointment.  Remote monitoring is used to monitor your Pacemaker of ICD from home. This monitoring reduces the number of office visits required to check your device to one time per year. It allows [place] to keep an eye on the functioning of your device to ensure it is working properly. You are scheduled for a device check from home on 10/07/16. You may send your transmission at any time that day. If you have a wireless device, the transmission will be sent automatically. After your physician reviews your transmission, you will receive a postcard with your next transmission date.  Your physician recommends that you continue on your current medications as directed. Please refer to the Current Medication list given to you today.  If you need a refill on your cardiac medications before your next appointment, please call your pharmacy.  Thank you for choosing [facility]!
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
## 2016-07-13 DIAGNOSIS — E291 Testicular hypofunction: Secondary | ICD-10-CM | POA: Diagnosis not present

## 2016-07-20 DIAGNOSIS — Z5181 Encounter for therapeutic drug level monitoring: Secondary | ICD-10-CM | POA: Diagnosis not present

## 2016-07-20 DIAGNOSIS — Z125 Encounter for screening for malignant neoplasm of prostate: Secondary | ICD-10-CM | POA: Diagnosis not present

## 2016-07-20 DIAGNOSIS — E230 Hypopituitarism: Secondary | ICD-10-CM | POA: Diagnosis not present

## 2016-07-21 ENCOUNTER — Other Ambulatory Visit: Payer: Self-pay | Admitting: Family Medicine

## 2016-07-26 ENCOUNTER — Ambulatory Visit (INDEPENDENT_AMBULATORY_CARE_PROVIDER_SITE_OTHER): Payer: Medicare Other | Admitting: Otolaryngology

## 2016-07-26 DIAGNOSIS — R040 Epistaxis: Secondary | ICD-10-CM

## 2016-07-26 DIAGNOSIS — J310 Chronic rhinitis: Secondary | ICD-10-CM | POA: Diagnosis not present

## 2016-07-27 DIAGNOSIS — E230 Hypopituitarism: Secondary | ICD-10-CM | POA: Diagnosis not present

## 2016-07-29 ENCOUNTER — Other Ambulatory Visit: Payer: Self-pay | Admitting: Family Medicine

## 2016-08-02 DIAGNOSIS — Z5181 Encounter for therapeutic drug level monitoring: Secondary | ICD-10-CM | POA: Diagnosis not present

## 2016-08-02 DIAGNOSIS — E230 Hypopituitarism: Secondary | ICD-10-CM | POA: Diagnosis not present

## 2016-08-02 DIAGNOSIS — Z125 Encounter for screening for malignant neoplasm of prostate: Secondary | ICD-10-CM | POA: Diagnosis not present

## 2016-08-02 DIAGNOSIS — R6882 Decreased libido: Secondary | ICD-10-CM | POA: Diagnosis not present

## 2016-08-02 DIAGNOSIS — E1142 Type 2 diabetes mellitus with diabetic polyneuropathy: Secondary | ICD-10-CM | POA: Diagnosis not present

## 2016-08-02 DIAGNOSIS — Z794 Long term (current) use of insulin: Secondary | ICD-10-CM | POA: Diagnosis not present

## 2016-08-02 DIAGNOSIS — I2510 Atherosclerotic heart disease of native coronary artery without angina pectoris: Secondary | ICD-10-CM | POA: Diagnosis not present

## 2016-08-02 DIAGNOSIS — R61 Generalized hyperhidrosis: Secondary | ICD-10-CM | POA: Diagnosis not present

## 2016-08-02 DIAGNOSIS — R232 Flushing: Secondary | ICD-10-CM | POA: Diagnosis not present

## 2016-08-02 NOTE — Addendum Note (Signed)
Encounter addended by: Suzanne Boron on: 08/02/2016  1:25 PM<BR>    Actions taken: Flowsheet data copied forward, Flowsheet accepted, Visit Navigator Flowsheet section accepted

## 2016-08-05 ENCOUNTER — Ambulatory Visit (INDEPENDENT_AMBULATORY_CARE_PROVIDER_SITE_OTHER): Payer: Medicare Other | Admitting: Nurse Practitioner

## 2016-08-05 ENCOUNTER — Encounter: Payer: Self-pay | Admitting: Internal Medicine

## 2016-08-05 ENCOUNTER — Encounter: Payer: Self-pay | Admitting: Nurse Practitioner

## 2016-08-05 VITALS — BP 122/74 | HR 70 | Temp 98.4°F | Ht 69.0 in | Wt 233.6 lb

## 2016-08-05 DIAGNOSIS — I255 Ischemic cardiomyopathy: Secondary | ICD-10-CM

## 2016-08-05 DIAGNOSIS — R1310 Dysphagia, unspecified: Secondary | ICD-10-CM | POA: Diagnosis not present

## 2016-08-05 DIAGNOSIS — R1319 Other dysphagia: Secondary | ICD-10-CM

## 2016-08-05 DIAGNOSIS — K219 Gastro-esophageal reflux disease without esophagitis: Secondary | ICD-10-CM
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
## 2016-08-05 NOTE — Assessment & Plan Note (Signed)
Dysphagia resolved after dilation. Continue to monitor.

## 2016-08-05 NOTE — Patient Instructions (Addendum)
1. Top taking Protonix for now. 2. I am giving you samples of Dexilant 60 mg. Take 1 pill, once a day, on empty stomach first thing in the morning. 3. I will give you samples to last 2 weeks. 4. Call us in 1-2 weeks and let us know if the medication is helping. 5. Return for follow-up in 2 months. 6. Call us if you have any worsening or severe symptoms before then.     Food Choices for Gastroesophageal Reflux Disease, Adult When you have gastroesophageal reflux disease (GERD), the foods you eat and your eating habits are very important. Choosing the right foods can help ease the discomfort of GERD. Consider working with a diet and nutrition specialist (dietitian) to help you make healthy food choices. What general guidelines should I follow? Eating plan   Choose healthy foods low in fat, such as fruits, vegetables, whole grains, low-fat dairy products, and lean meat, fish, and poultry.  Eat frequent, small meals instead of three large meals each day. Eat your meals slowly, in a relaxed setting. Avoid bending over or lying down until 2-3 hours after eating.  Limit high-fat foods such as fatty meats or fried foods.  Limit your intake of oils, butter, and shortening to less than 8 teaspoons each day.  Avoid the following:  Foods that cause symptoms. These may be different for different people. Keep a food diary to keep track of foods that cause symptoms.  Alcohol.  Drinking large amounts of liquid with meals.  Eating meals during the 2-3 hours before bed.  Cook foods using methods other than frying. This may include baking, grilling, or broiling. Lifestyle    Maintain a healthy weight. Ask your health care provider what weight is healthy for you. If you need to lose weight, work with your health care provider to do so safely.  Exercise for at least 30 minutes on 5 or more days each week, or as told by your health care provider.  Avoid wearing clothes that fit tightly around your  waist and chest.  Do not use any products that contain nicotine or tobacco, such as cigarettes and e-cigarettes. If you need help quitting, ask your health care provider.  Sleep with the head of your bed raised. Use a wedge under the mattress or blocks under the bed frame to raise the head of the bed. What foods are not recommended? The items listed may not be a complete list. Talk with your dietitian about what dietary choices are best for you. Grains  Pastries or quick breads with added fat. [place] toast. Vegetables  Deep fried vegetables. [place] fries. Any vegetables prepared with added fat. Any vegetables that cause symptoms. For some people this may include tomatoes and tomato products, chili peppers, onions and garlic, and horseradish. Fruits  Any fruits prepared with added fat. Any fruits that cause symptoms. For some people this may include citrus fruits, such as oranges, grapefruit, pineapple, and lemons. Meats and other protein foods  High-fat meats, such as fatty beef or pork, hot dogs, ribs, ham, sausage, salami and bacon. Fried meat or protein, including fried fish and fried chicken. Nuts and nut butters. Dairy  Whole milk and chocolate milk. Sour cream. Cream. Ice cream. Cream cheese. Milk shakes. Beverages  Coffee and tea, with or without caffeine. Carbonated beverages. Sodas. Energy drinks. Fruit juice made with acidic fruits (such as orange or grapefruit). Tomato juice. Alcoholic drinks. Fats and oils  Butter. Margarine. Shortening. Ghee. Sweets and desserts  Chocolate  and cocoa. Donuts. Seasoning and other foods  Pepper. Peppermint and spearmint. Any condiments, herbs, or seasonings that cause symptoms. For some people, this may include curry, hot sauce, or vinegar-based salad dressings. Summary  When you have gastroesophageal reflux disease (GERD), food and lifestyle choices are very important to help ease the discomfort of GERD.  Eat frequent, small meals instead of  three large meals each day. Eat your meals slowly, in a relaxed setting. Avoid bending over or lying down until 2-3 hours after eating.  Limit high-fat foods such as fatty meat or fried foods. This information is not intended to replace advice given to you by your health care provider. Make sure you discuss any questions you have with your health care provider. Document Released: 04/19/2005 Document Revised: 04/20/2016 Document Reviewed: 04/20/2016 Elsevier Interactive Patient Education  2017 [employer].

## 2016-08-05 NOTE — Progress Notes (Signed)
cc'ed to pcp °

## 2016-08-05 NOTE — Assessment & Plan Note (Signed)
Significantly worsening reflux since dilation. Likely dilation up and that his lower esophageal sphincter to more reflux. Protonix is not helping. Is using Gaviscon regularly. I will have him stop Protonix for now and start Dexilant 60 mg once a day. I will give him samples to last 1-2 weeks and request a progress report in 1-2 weeks. Return for follow-up in 2 months. Call if worsening symptoms.

## 2016-08-06 NOTE — Addendum Note (Signed)
Encounter addended by: Dwana Melena, RN on: 08/06/2016  1:07 PM<BR>    Actions taken: Visit Navigator Flowsheet section accepted, Sign clinical note

## 2016-08-06 NOTE — Progress Notes (Signed)
Pulmonary Individual Treatment Plan  Patient Details  Name: Lance [name] MRN: [id number] Date of Birth: 12/23/1945 Referring Provider:     PULMONARY REHAB OTHER RESP ORIENTATION from 02/26/2016 in [place]  Referring Provider  Dr. Harl Bowie      Initial Encounter Date:    PULMONARY REHAB OTHER RESP ORIENTATION from 02/26/2016 in [place]  Date  02/26/16  Referring Provider  Dr. Harl Bowie      Visit Diagnosis: SOB (shortness of breath)  Asthma in adult without complication, unspecified asthma severity, unspecified whether persistent  Patient's Home Medications on Admission:   Current Outpatient Prescriptions:  .  albuterol (PROVENTIL HFA;VENTOLIN HFA) 108 (90 BASE) MCG/ACT inhaler, Inhale 2 puffs into the lungs every 6 (six) hours as needed for wheezing., Disp: 1 Inhaler, Rfl: 2 .  albuterol (PROVENTIL) (2.5 MG/3ML) 0.083% nebulizer solution, Take 3 mLs (2.5 mg total) by nebulization every 6 (six) hours as needed for wheezing or shortness of breath., Disp: 150 mL, Rfl: 0 .  ALPRAZolam (XANAX) 1 MG tablet, TAKE 1/2-1 TABLET 2 TIMES A DAY AS NEEDED (Patient taking differently: TAKE 1/2-1 TABLET (0.5MG  TO 1 MG) 2 TIMES A DAY AS NEEDED FOR ANXIETY), Disp: 60 tablet, Rfl: 3 .  aspirin EC 81 MG tablet, Take 81 mg by mouth daily., Disp: , Rfl:  .  atorvastatin (LIPITOR) 80 MG tablet, TAKE ONE (1) TABLET EACH DAY, Disp: 90 tablet, Rfl: 0 .  baclofen (LIORESAL) 10 MG tablet, , Disp: , Rfl:  .  budesonide-formoterol (SYMBICORT) 160-4.5 MCG/ACT inhaler, Inhale 2 puffs into the lungs 2 (two) times daily., Disp: 1 Inhaler, Rfl: 3 .  cephALEXin (KEFLEX) 500 MG capsule, Take 1 capsule (500 mg total) by mouth 4 (four) times daily., Disp: 30 capsule, Rfl: 0 .  clopidogrel (PLAVIX) 75 MG tablet, TAKE ONE TABLET EVERY MORNING WITH BREAKFAST, Disp: 30 tablet, Rfl: 2 .  dicyclomine (BENTYL) 10 MG capsule, TAKE ONE CAPSULE EVERY MORNING AND ONE CAPSULE AT  BEDTIME AS NEEDED FOR INCREASED STOOLS AND BLOATING (Patient taking differently: Take 10 mg by mouth 2 (two) times daily as needed (for IBS (diarrhea issues)). ), Disp: 60 capsule, Rfl: 5 .  donepezil (ARICEPT) 10 MG tablet, Take 1 tablet (10 mg total) by mouth at bedtime., Disp: 90 tablet, Rfl: 3 .  DULoxetine (CYMBALTA) 60 MG capsule, TAKE ONE (1) CAPSULE EACH DAY, Disp: 30 capsule, Rfl: 5 .  empagliflozin (JARDIANCE) 25 MG TABS tablet, Take 25 mg by mouth daily., Disp: , Rfl:  .  fexofenadine (ALLEGRA) 180 MG tablet, Take 180 mg by mouth daily as needed for allergies. , Disp: , Rfl:  .  furosemide (LASIX) 20 MG tablet, Take 1 tablet (20 mg total) by mouth daily., Disp: 90 tablet, Rfl: 3 .  gabapentin (NEURONTIN) 300 MG capsule, TAKE ONE TO TWO CAPSULES BY MOUTH THREE TIMES AS DAY AS NEEDED AS NEEDED (Patient taking differently: TAKE ONE CAPSULE (300 MG) EVERY MORNING & TWO CAPSULES (600 MG ) AT BEDTIME), Disp: 180 capsule, Rfl: 6 .  glucose blood (ONE TOUCH ULTRA TEST) test strip, Use as instructed 4 times daily. E11.65, Disp: 150 each, Rfl: 1 .  guaiFENesin (MUCINEX) 600 MG 12 hr tablet, Take 1,200 mg by mouth 2 (two) times daily. , Disp: , Rfl:  .  HUMALOG KWIKPEN 100 UNIT/ML KiwkPen, Inject 0.32-0.38 mLs (32-38 Units total) into the skin 3 (three) times daily. (Patient taking differently: Inject 22-26 Units into the skin 3 (three) times daily before meals. ),  Disp: 30 pen, Rfl: 2 .  HYDROcodone-acetaminophen (LORTAB) 7.5-500 MG tablet, Take 1 tablet by mouth every 6 (six) hours as needed for pain., Disp: , Rfl:  .  Insulin Detemir (LEVEMIR FLEXTOUCH) 100 UNIT/ML Pen, Inject 90 Units into the skin daily at 10 pm. (Patient taking differently: Inject 100 Units into the skin at bedtime. ), Disp: 30 pen, Rfl: 2 .  losartan (COZAAR) 25 MG tablet, Take 1 tablet (25 mg total) by mouth daily., Disp: 30 tablet, Rfl: 11 .  magnesium oxide (MAG-OX) 400 MG tablet, TAKE ONE TABLET ONCE DAILY, Disp: 30 tablet,  Rfl: 11 .  metoprolol tartrate (LOPRESSOR) 25 MG tablet, Take 1 tablet (25 mg total) by mouth 2 (two) times daily., Disp: 60 tablet, Rfl: 11 .  nitroGLYCERIN (NITROSTAT) 0.4 MG SL tablet, Place 1 tablet (0.4 mg total) under the tongue every 5 (five) minutes as needed. For chest pain (Patient taking differently: Place 0.4 mg under the tongue every 5 (five) minutes as needed. For chest pain), Disp: 25 tablet, Rfl: 3 .  olopatadine (PATANOL) 0.1 % ophthalmic solution, Place 1 drop into both eyes 2 (two) times daily. (Patient taking differently: Place 1 drop into both eyes 2 (two) times daily as needed (for itchy/irritated eyes). ), Disp: 5 mL, Rfl: 12 .  pantoprazole (PROTONIX) 40 MG tablet, Take 1 tablet (40 mg total) by mouth 2 (two) times daily., Disp: 60 tablet, Rfl: 5 .  Probiotic Product (ALIGN PO), Take 1 tablet by mouth daily. , Disp: , Rfl:  .  sildenafil (REVATIO) 20 MG tablet, Take 3 -5 tabs as needed for erectile dysfunction, Disp: 20 tablet, Rfl: 4 .  sulfamethoxazole-trimethoprim (BACTRIM DS,SEPTRA DS) 800-160 MG tablet, Take 1 tablet by mouth 2 (two) times daily. (Patient not taking: Reported on 08/05/2016), Disp: 20 tablet, Rfl: 0 .  traZODone (DESYREL) 100 MG tablet, TAKE 1/2 TO 1 TABLET AT BEDTIME AS NEEDED FOR SLEEP (Patient taking differently: TAKE 1 TABLET (100 MG) BY MOUTH AT BEDTIME), Disp: 30 tablet, Rfl: 5 .  UNIFINE PENTIPS 31G X 6 MM MISC, USE PEN NEEDLES 4 TIMES A DAY, Disp: 150 each, Rfl: 0 .  vitamin B-12 (CYANOCOBALAMIN) 1000 MCG tablet, Take 1,000 mcg by mouth daily. Reported on 08/25/2015, Disp: , Rfl:  .  Vitamin D, Ergocalciferol, (DRISDOL) 50000 units CAPS capsule, TAKE ONE CAPSULE BY MOUTH ONCE A WEEK (Patient taking differently: TAKE ONE CAPSULE BY MOUTH ONCE A MONTH -3RD OF MONTH), Disp: 12 capsule, Rfl: 0  Past Medical History: Past Medical History:  Diagnosis Date  . Allergic rhinitis, cause unspecified   . Anxiety and depression   . ASCVD (arteriosclerotic  cardiovascular disease)    70% mid left anterior descending lesion on cath in 06/1995; left anterior desending DES placed in 8/03 and RCA stent in 9/03; captain 3/05 revealed 90% second marginal for which PCI was performed, 70% PDA and a total obstruction of the first diagonal and marginal; sudden cardiac death in [place] in 2003-11-20 for which automatic implantable cardiac defibrillator placed; negativ stress nuclear 10/07  . Atrial fibrillation ([place])   . Benign prostatic hypertrophy   . C. difficile diarrhea   . CAD (coronary artery disease) 1997  . CHF (congestive heart failure) ([place])   . COPD (chronic obstructive pulmonary disease) ([employer])   . Coronary artery disease   . CVD (cerebrovascular disease) 05/2008   Transient ischemic attack; carotid ultrasound-plaque without focal disease  . Degenerative joint disease 2002   C-spine fusion   . Diabetes  mellitus    Insulin requirement  . Diabetes mellitus without mention of complication   . Diabetic peripheral neuropathy ([place]) 09/06/2014  . Erectile dysfunction   . Esophageal reflux   . HOH (hard of hearing)   . Hx-TIA (transient ischemic attack) 2010  . Hyperlipidemia   . Hypertension   . ICD (implantable cardiac defibrillator) in place 2005   [place] ICD, for SCD  . Memory deficits 09/05/2013  . Myocardial infarction   . Other testicular hypofunction   . Pacemaker   . S/P endoscopy Dec 2011   RMR: nl esophagus, hyperplastic polyp, active gastritis, no H.pylori.   . Shortness of breath   . Stroke ([place])   . Tobacco abuse    100 pack/year comsuption; cigarettes discontinued 2003; all tobacco products in 2008  . Tubular adenoma   . Vitreous floaters of left eye     Tobacco Use: History  Smoking Status  . Former Smoker  . Packs/day: 3.00  . Years: 40.00  . Types: Cigarettes  . Start date: 05/03/1954  . Quit date: 05/03/1998  Smokeless Tobacco  . Current User  . Types: Chew    Labs: Recent Review Flowsheet Data    Labs for ITP  Cardiac and Pulmonary Rehab Latest Ref Rng & Units 07/25/2013 11/06/2014 02/06/2015 05/15/2015 11/21/2015   Cholestrol 125 - 200 mg/dL 154 - - - 125   LDLCALC <130 mg/dL 92 - - - 55   HDL >=40 mg/dL 31(L) - - - 34(L)   Trlycerides <150 mg/dL 155(H) - - - 178(H)   Hemoglobin A1c <5.7 % - 10.2(A) 9.4(A) 9.9(H) 9.0(H)   PHART 7.350 - 7.450 - - - - -   PCO2ART 35.0 - 45.0 mmHg - - - - -   HCO3 20.0 - 24.0 mEq/L - - - - -   TCO2 0 - 100 mmol/L - - - - -   ACIDBASEDEF 0.0 - 2.0 mmol/L - - - - -   O2SAT % - - - - -      Capillary Blood Glucose: Lab Results  Component Value Date   GLUCAP 226 (H) 05/10/2016   GLUCAP 166 (H) 07/09/2015   GLUCAP 287 (H) 07/08/2015   GLUCAP 222 (H) 07/08/2015   GLUCAP 118 (H) 07/08/2015     ADL UCSD:     Pulmonary Assessment Scores    Row Name 02/26/16 1340         ADL UCSD   ADL Phase Entry     SOB Score total 114     Rest 2     Walk 6     Stairs 4     Bath 2     Dress 2     Shop 3       CAT Score   CAT Score 26       mMRC Score   mMRC Score 4        Pulmonary Function Assessment:     Pulmonary Function Assessment - 02/26/16 1337      Pulmonary Function Tests   FVC% 87 %   FEV1% 88 %   FEV1/FVC Ratio 101   RV% 111 %   DLCO% 92 %     Initial Spirometry Results   FVC% 87 %   FEV1% 88 %   FEV1/FVC Ratio 101     Post Bronchodilator Spirometry Results   FVC% 88 %   FEV1% 94 %   FEV1/FVC Ratio 106     Breath  Shortness of Breath Yes;Limiting activity      Exercise Target Goals:    Exercise Program Goal: Individual exercise prescription set with THRR, safety & activity barriers. Participant demonstrates ability to understand and report RPE using BORG scale, to self-measure pulse accurately, and to acknowledge the importance of the exercise prescription.  Exercise Prescription Goal: Starting with aerobic activity 30 plus minutes a day, 3 days per week for initial exercise prescription. Provide home exercise prescription  and guidelines that participant acknowledges understanding prior to discharge.  Activity Barriers & Risk Stratification:     Activity Barriers & Cardiac Risk Stratification - 02/26/16 1141      Activity Barriers & Cardiac Risk Stratification   Activity Barriers Back Problems;Left Knee Replacement;Joint Problems   Cardiac Risk Stratification High      6 Minute Walk:     6 Minute Walk    Row Name 02/26/16 1134         6 Minute Walk   Phase Initial     Distance 950 feet     Distance % Change 0 %     Walk Time 6 minutes     # of Rest Breaks 0     MPH 1.79     METS 2.37     RPE 15     Perceived Dyspnea  14     VO2 Peak 6.7     Symptoms No     Resting HR 69 bpm     Resting BP 130/72     Max Ex. HR 82 bpm     Max Ex. BP 150/76     2 Minute Post BP 138/74        Oxygen Initial Assessment:   Oxygen Re-Evaluation:   Oxygen Discharge (Final Oxygen Re-Evaluation):   Initial Exercise Prescription:     Initial Exercise Prescription - 02/26/16 1000      Date of Initial Exercise RX and Referring Provider   Date 02/26/16   Referring Provider Dr. Harl Bowie     Treadmill   MPH 1   Grade 0   Minutes 15   METs 1.6     Arm Ergometer   Level 2   Watts 15   Minutes 20   METs 1.9     Prescription Details   Frequency (times per week) 3   Duration Progress to 30 minutes of continuous aerobic without signs/symptoms of physical distress     Intensity   THRR REST +  20   THRR 40-80% of Max Heartrate [phone]   Ratings of Perceived Exertion 11-13   Perceived Dyspnea 0-4     Progression   Progression Continue progressive overload as per policy without signs/symptoms or physical distress.     Resistance Training   Training Prescription Yes   Weight 1   Reps 10-12      Perform Capillary Blood Glucose checks as needed.  Exercise Prescription Changes:      Exercise Prescription Changes    Row Name 03/09/16 1200 04/08/16 1500 05/10/16 0800 06/08/16 1200  07/05/16 1200     Response to Exercise   Blood Pressure (Admit) 128/62 156/80 124/68 114/60 140/78   Blood Pressure (Exercise) 130/68 162/72 154/72 130/68 140/78   Blood Pressure (Exit) 146/70 140/78 120/60 132/70 140/72   Heart Rate (Admit) 70 bpm 74 bpm 62 bpm 76 bpm 77 bpm   Heart Rate (Exercise) 87 bpm 94 bpm 80 bpm 88 bpm 90 bpm   Heart Rate (Exit) 74 bpm 83 bpm 74  bpm 87 bpm 86 bpm   Oxygen Saturation (Admit) 96 % 95 % 95 % 97 % 95 %   Oxygen Saturation (Exercise) 96 % 94 % 90 % 94 % 91 %   Oxygen Saturation (Exit) 94 % 97 % 97 % 92 % 96 %   Rating of Perceived Exertion (Exercise) 9 8 9 9 9    Perceived Dyspnea (Exercise) 9 11 11 12 12    Duration Progress to 30 minutes of continuous aerobic without signs/symptoms of physical distress Progress to 30 minutes of continuous aerobic without signs/symptoms of physical distress Progress to 30 minutes of continuous aerobic without signs/symptoms of physical distress Progress to 30 minutes of continuous aerobic without signs/symptoms of physical distress Progress to 30 minutes of  aerobic without signs/symptoms of physical distress   Intensity Rest + 20 Rest + 20 Rest + 20 Rest + 20 THRR unchanged     Progression   Progression Continue progressive overload as per policy without signs/symptoms or physical distress. Continue progressive overload as per policy without signs/symptoms or physical distress. Continue progressive overload as per policy without signs/symptoms or physical distress. Continue progressive overload as per policy without signs/symptoms or physical distress. Continue to progress workloads to maintain intensity without signs/symptoms of physical distress.     Resistance Training   Training Prescription Yes Yes Yes Yes Yes   Weight 3 4 5 5 5    Reps 10-12 10-12 10-12 10-12 10-15     Treadmill   MPH 1.6 1.8 1.9 1.9 2.1   Grade 0 0 0 0 0   Minutes 15 15 15 15 15    METs 2.2 2.3 2.4 2.4 2.6     Arm Ergometer   Level 2.6 2.8 2.9  3.3 3.1   Watts 8 30 30  38 30   Minutes 20 20 20 20 20    METs 2.6 3.2 3.1 3.8 2.8     Home Exercise Plan   Plans to continue exercise at [facility]  -   Frequency Add 2 additional days to program exercise sessions. Add 2 additional days to program exercise sessions. Add 2 additional days to program exercise sessions. Add 2 additional days to program exercise sessions. Add 2 additional days to program exercise sessions.     Exercise Review   Progression Yes Yes Yes Yes Yes   Row Name 08/02/16 1300             Response to Exercise   Blood Pressure (Admit) 140/78       Blood Pressure (Exercise) 140/78       Blood Pressure (Exit) 140/72       Heart Rate (Admit) 77 bpm       Heart Rate (Exercise) 90 bpm       Heart Rate (Exit) 86 bpm       Oxygen Saturation (Admit) 95 %       Oxygen Saturation (Exercise) 91 %       Oxygen Saturation (Exit) 96 %       Rating of Perceived Exertion (Exercise) 9       Perceived Dyspnea (Exercise) 12       Duration Progress to 30 minutes of  aerobic without signs/symptoms of physical distress       Intensity THRR unchanged         Progression   Progression Continue to progress workloads to maintain intensity without signs/symptoms of physical distress.         Horticulturist, commercial  Prescription Yes       Weight 5       Reps 10-15         Treadmill   MPH 2.1       Grade 0       Minutes 15       METs 2.6         Arm Ergometer   Level 3.1       Watts 30       Minutes 20       METs 2.8         Home Exercise Plan   Frequency Add 2 additional days to program exercise sessions.         Exercise Review   Progression Yes          Exercise Comments:      Exercise Comments    Row Name 03/09/16 1223 04/08/16 1510 05/10/16 0849 06/08/16 1251 07/05/16 1255   Exercise Comments Patient is proggressing well. Patient is progressing appropriately  Patient is proggressing appropriately Patient is progressing well Patient is  progressing well and handling the treadmill speecd increase   Row Name 07/21/16 1116 08/02/16 1325         Exercise Comments Patient has not progressed due to lack of attendance.  Patient has not returned to [facility] since 07/01/2016         Exercise Goals and Review:   Exercise Goals Re-Evaluation :     Exercise Goals Re-Evaluation    [place] Name 07/06/16 1558             Exercise Goal Re-Evaluation   Exercise Goals Review Increase Strenth and Stamina       Comments Patient is progressing with increased strength and stamina.        Expected Outcomes Patient will continue to progress in the program.           Discharge Exercise Prescription (Final Exercise Prescription Changes):     Exercise Prescription Changes - 08/02/16 1300      Response to Exercise   Blood Pressure (Admit) 140/78   Blood Pressure (Exercise) 140/78   Blood Pressure (Exit) 140/72   Heart Rate (Admit) 77 bpm   Heart Rate (Exercise) 90 bpm   Heart Rate (Exit) 86 bpm   Oxygen Saturation (Admit) 95 %   Oxygen Saturation (Exercise) 91 %   Oxygen Saturation (Exit) 96 %   Rating of Perceived Exertion (Exercise) 9   Perceived Dyspnea (Exercise) 12   Duration Progress to 30 minutes of  aerobic without signs/symptoms of physical distress   Intensity THRR unchanged     Progression   Progression Continue to progress workloads to maintain intensity without signs/symptoms of physical distress.     Resistance Training   Training Prescription Yes   Weight 5   Reps 10-15     Treadmill   MPH 2.1   Grade 0   Minutes 15   METs 2.6     Arm Ergometer   Level 3.1   Watts 30   Minutes 20   METs 2.8     Home Exercise Plan   Frequency Add 2 additional days to program exercise sessions.     Exercise Review   Progression Yes      Nutrition:  Target Goals: Understanding of nutrition guidelines, daily intake of sodium 1500mg , cholesterol 200mg , calories 30% from fat and 7% or less from saturated fats,  daily to have 5 or more servings of fruits and vegetables.  Biometrics:     Pre Biometrics - 02/26/16 1037      Pre Biometrics   Height 5\' 9"  (1.753 m)   Weight 236 lb 12.4 oz (107.4 kg)   Waist Circumference 44.5 inches   Hip Circumference 42.5 inches   Waist to Hip Ratio 1.05 %   BMI (Calculated) 35   Triceps Skinfold 6 mm   % Body Fat 28.6 %   Grip Strength 81.33 kg   Flexibility 0 in   Single Leg Stand 3 seconds       Nutrition Therapy Plan and Nutrition Goals:   Nutrition Discharge: Rate Your Plate Scores:     Nutrition Assessments - 02/26/16 1344      Rate Your Plate Scores   Pre Score 17      Nutrition Goals Re-Evaluation:   Nutrition Goals Discharge (Final Nutrition Goals Re-Evaluation):   Psychosocial: Target Goals: Acknowledge presence or absence of significant depression and/or stress, maximize coping skills, provide positive support system. Participant is able to verbalize types and ability to use techniques and skills needed for reducing stress and depression.  Initial Review & Psychosocial Screening:     Initial Psych Review & Screening - 02/26/16 1402      Initial Review   Current issues with Current Anxiety/Panic  Main concern is being active again w/o SOB.     Family Dynamics   Good Support System? Yes     Barriers   Psychosocial barriers to participate in program Psychosocial barriers identified (see note);The patient should benefit from training in stress management and relaxation.  Though the patients QOL was 23.09, his PHQ-9 was 9. He said he feels depressed and hopeless. He siad it is more based in the fact he can't do anything without getting breathless.       Screening Interventions   Interventions Encouraged to exercise      Quality of Life Scores:     Quality of Life - 02/26/16 1038      Quality of Life Scores   Health/Function Pre 19.5 %   Socioeconomic Pre 24.86 %   Psych/Spiritual Pre 29.14 %   Family Pre 24 %    GLOBAL Pre 23.09 %      PHQ-9: Recent Review Flowsheet Data    Depression screen [facility] 2/9 02/26/2016 11/25/2015 08/25/2015 07/04/2015 06/04/2015   Decreased Interest 3 0 0 0 0   Down, Depressed, Hopeless 3 0 0 0 0   PHQ - 2 Score 6 0 0 0 0   Altered sleeping 0 - - - -   Tired, decreased energy 3 - - - -   Change in appetite 0 - - - -   Feeling bad or failure about yourself  0 - - - -   Trouble concentrating 0 - - - -   Moving slowly or fidgety/restless 0 - - - -   PHQ-9 Score 9 - - - -   Difficult doing work/chores Not difficult at all - - - -     Interpretation of Total Score  Total Score Depression Severity:  1-4 = Minimal depression, 5-9 = Mild depression, 10-14 = Moderate depression, 15-19 = Moderately severe depression, 20-27 = Severe depression   Psychosocial Evaluation and Intervention:     Psychosocial Evaluation - 02/26/16 1406      Psychosocial Evaluation & Interventions   Interventions Stress management education;Relaxation education;Encouraged to exercise with the program and follow exercise prescription   Continue Psychosocial Services  Yes  The patient does  not feel he needs counseling. From my observation he would benefit from consulting with a counselor.       Psychosocial Re-Evaluation:     Psychosocial Re-Evaluation    [place] Name 03/17/16 1441 04/14/16 1501 05/12/16 1339 06/10/16 0806 07/06/16 1600     Psychosocial Re-Evaluation   Current issues with  -  -  -  - -  Was feeling down and depressed at time.    Comments Patient's QOL score was 23.09. He says he is not depressed and does not need counseling.  Patient continues to have no psychosicoal issues identified.  Patient says he does feel depressed at times d/t his SOB. He believes if he could get his strength back, he would feel better mentally. Will continue to monitor.  Patient continue to have no psychosocial issues identified.  Patient is feeling better since his SOB has improved and he is able work.     Expected Outcomes  -  -  -  - Patient's QOL and PHQ-9 scores will improve at dischage.    Interventions Encouraged to attend Pulmonary Rehabilitation for the exercise Encouraged to attend Pulmonary Rehabilitation for the exercise Encouraged to attend Pulmonary Rehabilitation for the exercise;Stress management education Encouraged to attend Pulmonary Rehabilitation for the exercise Encouraged to attend Pulmonary Rehabilitation for the exercise   Continue Psychosocial Services  No No No No Follow up required by staff      Psychosocial Discharge (Final Psychosocial Re-Evaluation):     Psychosocial Re-Evaluation - 07/06/16 1600      Psychosocial Re-Evaluation   Current issues with --  Was feeling down and depressed at time.    Comments Patient is feeling better since his SOB has improved and he is able work.    Expected Outcomes Patient's QOL and PHQ-9 scores will improve at dischage.    Interventions Encouraged to attend Pulmonary Rehabilitation for the exercise   Continue Psychosocial Services  Follow up required by staff      Education: Education Goals: Education classes will be provided on a weekly basis, covering required topics. Participant will state understanding/return demonstration of topics presented.  Learning Barriers/Preferences:     Learning Barriers/Preferences - 02/26/16 1142      Learning Barriers/Preferences   Learning Barriers None   Learning Preferences Individual Instruction;Skilled Demonstration;Group Instruction      Education Topics: Risk Factor Reduction:  -Group instruction that is supported by a PowerPoint presentation. Instructor discusses the definition of a risk factor, different risk factors for pulmonary disease, and how the heart and lungs work together.     Nutrition for Pulmonary Patient:  -Group instruction provided by PowerPoint slides, verbal discussion, and written materials to support subject matter. The instructor gives an explanation and  review of healthy diet recommendations, which includes a discussion on weight management, recommendations for fruit and vegetable consumption, as well as protein, fluid, caffeine, fiber, sodium, sugar, and alcohol. Tips for eating when patients are short of breath are discussed.   Pursed Lip Breathing:  -Group instruction that is supported by demonstration and informational handouts. Instructor discusses the benefits of pursed lip and diaphragmatic breathing and detailed demonstration on how to preform both.     Oxygen Safety:  -Group instruction provided by PowerPoint, verbal discussion, and written material to support subject matter. There is an overview of "What is Oxygen" and "Why do we need it".  Instructor also reviews how to create a safe environment for oxygen use, the importance of using oxygen as prescribed, and the risks  of noncompliance. There is a brief discussion on traveling with oxygen and resources the patient may utilize.   Oxygen Equipment:  -Group instruction provided by [facility] Staff utilizing handouts, written materials, and equipment demonstrations.   Signs and Symptoms:  -Group instruction provided by written material and verbal discussion to support subject matter. Warning signs and symptoms of infection, stroke, and heart attack are reviewed and when to call the physician/911 reinforced. Tips for preventing the spread of infection discussed.   Advanced Directives:  -Group instruction provided by verbal instruction and written material to support subject matter. Instructor reviews Advanced Directive laws and proper instruction for filling out document.   Pulmonary Video:  -Group video education that reviews the importance of medication and oxygen compliance, exercise, good nutrition, pulmonary hygiene, and pursed lip and diaphragmatic breathing for the pulmonary patient.   Exercise for the Pulmonary Patient:  -Group instruction that is supported by a  PowerPoint presentation. Instructor discusses benefits of exercise, core components of exercise, frequency, duration, and intensity of an exercise routine, importance of utilizing pulse oximetry during exercise, safety while exercising, and options of places to exercise outside of rehab.     Pulmonary Medications:  -Verbally interactive group education provided by instructor with focus on inhaled medications and proper administration.   Anatomy and Physiology of the Respiratory System and Intimacy:  -Group instruction provided by PowerPoint, verbal discussion, and written material to support subject matter. Instructor reviews respiratory cycle and anatomical components of the respiratory system and their functions. Instructor also reviews differences in obstructive and restrictive respiratory diseases with examples of each. Intimacy, Sex, and Sexuality differences are reviewed with a discussion on how relationships can change when diagnosed with pulmonary disease. Common sexual concerns are reviewed.   Knowledge Questionnaire Score:     Knowledge Questionnaire Score - 02/26/16 1335      Knowledge Questionnaire Score   Pre Score 10/14      Core Components/Risk Factors/Patient Goals at Admission:     Personal Goals and Risk Factors at Admission - 02/26/16 1344      Core Components/Risk Factors/Patient Goals on Admission    Weight Management Yes   Intervention Weight Management/Obesity: Establish reasonable short term and long term weight goals.   Admit Weight 237 lb (107.5 kg)   Goal Weight: Short Term 227 lb (103 kg)   Goal Weight: Long Term 217 lb (98.4 kg)   Expected Outcomes Short Term: Continue to assess and modify interventions until short term weight is achieved;Long Term: Adherence to nutrition and physical activity/exercise program aimed toward attainment of established weight goal   Sedentary Yes   Intervention Provide advice, education, support and counseling about physical  activity/exercise needs.;Develop an individualized exercise prescription for aerobic and resistive training based on initial evaluation findings, risk stratification, comorbidities and participant's personal goals.   Expected Outcomes Achievement of increased cardiorespiratory fitness and enhanced flexibility, muscular endurance and strength shown through measurements of functional capacity and personal statement of participant.   Increase Strength and Stamina Yes   Intervention Provide advice, education, support and counseling about physical activity/exercise needs.;Develop an individualized exercise prescription for aerobic and resistive training based on initial evaluation findings, risk stratification, comorbidities and participant's personal goals.   Expected Outcomes Achievement of increased cardiorespiratory fitness and enhanced flexibility, muscular endurance and strength shown through measurements of functional capacity and personal statement of participant.   Improve shortness of breath with ADL's Yes   Intervention Provide education, individualized exercise plan and daily activity instruction to  help decrease symptoms of SOB with activities of daily living.   Expected Outcomes Short Term: Achieves a reduction of symptoms when performing activities of daily living.   Develop more efficient breathing techniques such as purse lipped breathing and diaphragmatic breathing; and practicing self-pacing with activity Yes   Intervention Provide education, demonstration and support about specific breathing techniuqes utilized for more efficient breathing. Include techniques such as pursed lipped breathing, diaphragmatic breathing and self-pacing activity.   Expected Outcomes Short Term: Participant will be able to demonstrate and use breathing techniques as needed throughout daily activities.   Diabetes Yes   Intervention Provide education about signs/symptoms and action to take for hypo/hyperglycemia.    Expected Outcomes Long Term: Attainment of HbA1C < 7%.;Short Term: Participant verbalizes understanding of the signs/symptoms and immediate care of hyper/hypoglycemia, proper foot care and importance of medication, aerobic/resistive exercise and nutrition plan for blood glucose control.   Stress Yes   Intervention Offer individual and/or small group education and counseling on adjustment to heart disease, stress management and health-related lifestyle change. Teach and support self-help strategies.   Expected Outcomes Short Term: Participant demonstrates changes in health-related behavior, relaxation and other stress management skills, ability to obtain effective social support, and compliance with psychotropic medications if prescribed.;Long Term: Emotional wellbeing is indicated by absence of clinically significant psychosocial distress or social isolation.   Personal Goal Other Yes   Personal Goal Lose weight 10-20lbs before graduation. then continue to lose up to 50lbs. Breath better.   Intervention Attend Pulmonary Rehab 2 x week and then supplement exercise at home with 3 x week.    Expected Outcomes Reach personal goals.       Core Components/Risk Factors/Patient Goals Review:      Goals and Risk Factor Review    Row Name 02/26/16 1402 03/17/16 1440 04/14/16 1459 05/12/16 1335 06/10/16 0804     Core Components/Risk Factors/Patient Goals Review   Personal Goals Review Weight Management/Obesity;Improve shortness of breath with ADL's;Develop more efficient breathing techniques such as purse lipped breathing and diaphragmatic breathing and practicing self-pacing with activity.;Diabetes;Stress Weight Management/Obesity;Increase Strength and Stamina;Improve shortness of breath with ADL's;Develop more efficient breathing techniques such as purse lipped breathing and diaphragmatic breathing and practicing self-pacing with activity.;Diabetes;Stress Weight Management/Obesity;Sedentary;Improve  shortness of breath with ADL's;Increase Strength and Stamina;Develop more efficient breathing techniques such as purse lipped breathing and diaphragmatic breathing and practicing self-pacing with activity.;Stress;Increase knowledge of respiratory medications and ability to use respiratory devices properly.  Lose weight 10-20lbs before graduation. then continue to lose up to 50lbs. Breath better. Weight Management/Obesity;Increase Strength and Stamina;Improve shortness of breath with ADL's;Diabetes;Stress;Develop more efficient breathing techniques such as purse lipped breathing and diaphragmatic breathing and practicing self-pacing with activity.  Lose weight 10-20lbs before graduation. then continue to lose up to 50lbs. Breath better. Weight Management/Obesity;Sedentary;Increase Strength and Stamina;Develop more efficient breathing techniques such as purse lipped breathing and diaphragmatic breathing and practicing self-pacing with activity.;Stress;Improve shortness of breath with ADL's;Increase knowledge of respiratory medications and ability to use respiratory devices properly.  Lose 10-20 lbs before graduation; continue to lose 50 lbs. Breathe better.    Review  - Patient new to program. He has attended 6 sessions. Will continue to monitor for progress.  Patient has attended 12 sessions losing 2 lbs. He is doing well in the program with some increased strength and stamina and some improvement in his SOB.  Patient has attended 15 sessions. He has maintained his weight. He missed the last 3 sessions d/t illness. He  is progressing well with improvement in his strength, stamina and SOB.  Patient has attended 19 sessions. He has gained 3 lbs not meeting his weight loss goals. He is progressing in the program with increased strength and stamina and improved SOB. He says the program is helping and he is more active.    Expected Outcomes  - Patient will complete the program meeting his personal goals.  Patient will  continue to attend sessions meeting his personal goals.  Patient will continue to attend sessions and complete the program meeting his personal goals.  Patient will continue to attend sessions meeting his personal goals.    [place] Name 07/06/16 1555             Core Components/Risk Factors/Patient Goals Review   Personal Goals Review Weight Management/Obesity;Improve shortness of breath with ADL's;Increase knowledge of respiratory medications and ability to use respiratory devices properly.;Develop more efficient breathing techniques such as purse lipped breathing and diaphragmatic breathing and practicing self-pacing with activity.  Lose 10 lbs/month. Lose a total of 50 lbs. Breath better.        Review Patient has completed 24 sessions. He has gained 3 lbs. His SOB has improved. He has returned to work full time without difficulty.        Expected Outcomes Patient will complete the program. He will start to lose weight and his SOB will continue to improve.           Core Components/Risk Factors/Patient Goals at Discharge (Final Review):      Goals and Risk Factor Review - 07/06/16 1555      Core Components/Risk Factors/Patient Goals Review   Personal Goals Review Weight Management/Obesity;Improve shortness of breath with ADL's;Increase knowledge of respiratory medications and ability to use respiratory devices properly.;Develop more efficient breathing techniques such as purse lipped breathing and diaphragmatic breathing and practicing self-pacing with activity.  Lose 10 lbs/month. Lose a total of 50 lbs. Breath better.    Review Patient has completed 24 sessions. He has gained 3 lbs. His SOB has improved. He has returned to work full time without difficulty.    Expected Outcomes Patient will complete the program. He will start to lose weight and his SOB will continue to improve.       ITP Comments:     ITP Comments    Row Name 08/06/16 1303           ITP Comments Patient has not  attended since last 30 Day review. Patient will not return phone calls. A letter of discharge has been mailed.          Comments: ITP 30 Day REVIEW Patient has not attended since last 30 Day review at 24 sessions. Patient will not return phone calls. A letter of discharge has been mailed.

## 2016-08-10 ENCOUNTER — Ambulatory Visit (INDEPENDENT_AMBULATORY_CARE_PROVIDER_SITE_OTHER): Payer: Medicare Other | Admitting: Cardiology

## 2016-08-10 ENCOUNTER — Telehealth: Payer: Self-pay

## 2016-08-10 ENCOUNTER — Other Ambulatory Visit: Payer: Self-pay | Admitting: Internal Medicine

## 2016-08-10 ENCOUNTER — Encounter: Payer: Self-pay | Admitting: Cardiology

## 2016-08-10 VITALS — BP 142/90 | HR 80 | Wt 238.0 lb

## 2016-08-10 DIAGNOSIS — I5022 Chronic systolic (congestive) heart failure: Secondary | ICD-10-CM

## 2016-08-10 DIAGNOSIS — E782 Mixed hyperlipidemia: Secondary | ICD-10-CM

## 2016-08-10 DIAGNOSIS — E230 Hypopituitarism: Secondary | ICD-10-CM | POA: Diagnosis not present

## 2016-08-10 DIAGNOSIS — I10 Essential (primary) hypertension: Secondary | ICD-10-CM | POA: Diagnosis not present

## 2016-08-10 DIAGNOSIS — I255 Ischemic cardiomyopathy: Secondary | ICD-10-CM

## 2016-08-10 DIAGNOSIS — R0602 Shortness of breath: Secondary | ICD-10-CM

## 2016-08-10 DIAGNOSIS — I2510 Atherosclerotic heart disease of native coronary artery without angina pectoris: Secondary | ICD-10-CM

## 2016-08-10 MED ORDER — LOSARTAN POTASSIUM 50 MG PO TABS: 50.0000 mg | ORAL_TABLET | Freq: Every day | ORAL | 3 refills | Status: DC

## 2016-08-10 NOTE — Patient Instructions (Signed)
Your physician recommends that you schedule a follow-up appointment in: 1 Month with Dr. Harl Bowie  Your physician has recommended you make the following change in your medication:  Increase Losartan to 50 mg Daily   Your physician recommends that you return for lab work in: 2 Weeks ( 08/24/16)  Your physician has requested that you have an echocardiogram. Echocardiography is a painless test that uses sound waves to create images of your heart. It provides your doctor with information about the size and shape of your heart and how well your heart's chambers and valves are working. This procedure takes approximately one hour. There are no restrictions for this procedure.  If you need a refill on your cardiac medications before your next appointment, please call your pharmacy.  Thank you for choosing [facility]!

## 2016-08-10 NOTE — Telephone Encounter (Signed)
Pt is calling to let us know that the Dexilant is mot working for him. He said that he needs some different. Please advise
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
## 2016-08-11 NOTE — Progress Notes (Signed)
Pulmonary Individual Treatment Plan  Patient Details  Name: Lance [name] MRN: [id number] Date of Birth: 1945/11/18 Referring Provider:     PULMONARY REHAB OTHER RESP ORIENTATION from 02/26/2016 in [place]  Referring Provider  Dr. Harl Bowie      Initial Encounter Date:    PULMONARY [facility] from 02/26/2016 in [place]  Date  02/26/16  Referring Provider  Dr. Harl Bowie      Visit Diagnosis: SOB (shortness of breath)  Asthma in adult without complication, unspecified asthma severity, unspecified whether persistent  Patient's Home Medications on Admission:   Current Outpatient Prescriptions:  .  albuterol (PROVENTIL HFA;VENTOLIN HFA) 108 (90 BASE) MCG/ACT inhaler, Inhale 2 puffs into the lungs every 6 (six) hours as needed for wheezing., Disp: 1 Inhaler, Rfl: 2 .  albuterol (PROVENTIL) (2.5 MG/3ML) 0.083% nebulizer solution, Take 3 mLs (2.5 mg total) by nebulization every 6 (six) hours as needed for wheezing or shortness of breath., Disp: 150 mL, Rfl: 0 .  ALPRAZolam (XANAX) 1 MG tablet, TAKE 1/2-1 TABLET 2 TIMES A DAY AS NEEDED (Patient taking differently: TAKE 1/2-1 TABLET (0.5MG  TO 1 MG) 2 TIMES A DAY AS NEEDED FOR ANXIETY), Disp: 60 tablet, Rfl: 3 .  aspirin EC 81 MG tablet, Take 81 mg by mouth daily., Disp: , Rfl:  .  atorvastatin (LIPITOR) 80 MG tablet, TAKE ONE (1) TABLET EACH DAY, Disp: 90 tablet, Rfl: 0 .  baclofen (LIORESAL) 10 MG tablet, , Disp: , Rfl:  .  budesonide-formoterol (SYMBICORT) 160-4.5 MCG/ACT inhaler, Inhale 2 puffs into the lungs 2 (two) times daily., Disp: 1 Inhaler, Rfl: 3 .  cephALEXin (KEFLEX) 500 MG capsule, Take 1 capsule (500 mg total) by mouth 4 (four) times daily., Disp: 30 capsule, Rfl: 0 .  clopidogrel (PLAVIX) 75 MG tablet, TAKE ONE TABLET EVERY MORNING WITH BREAKFAST, Disp: 30 tablet, Rfl: 2 .  dicyclomine (BENTYL) 10 MG capsule, TAKE ONE CAPSULE EVERY MORNING AND ONE CAPSULE AT  BEDTIME AS NEEDED FOR INCREASED STOOLS AND BLOATING (Patient taking differently: Take 10 mg by mouth 2 (two) times daily as needed (for IBS (diarrhea issues)). ), Disp: 60 capsule, Rfl: 5 .  donepezil (ARICEPT) 10 MG tablet, Take 1 tablet (10 mg total) by mouth at bedtime., Disp: 90 tablet, Rfl: 3 .  DULoxetine (CYMBALTA) 60 MG capsule, TAKE ONE (1) CAPSULE EACH DAY, Disp: 30 capsule, Rfl: 5 .  fexofenadine (ALLEGRA) 180 MG tablet, Take 180 mg by mouth daily as needed for allergies. , Disp: , Rfl:  .  furosemide (LASIX) 20 MG tablet, Take 1 tablet (20 mg total) by mouth daily., Disp: 90 tablet, Rfl: 3 .  gabapentin (NEURONTIN) 300 MG capsule, TAKE ONE TO TWO CAPSULES BY MOUTH THREE TIMES AS DAY AS NEEDED AS NEEDED (Patient taking differently: TAKE ONE CAPSULE (300 MG) EVERY MORNING & TWO CAPSULES (600 MG ) AT BEDTIME), Disp: 180 capsule, Rfl: 6 .  glucose blood (ONE TOUCH ULTRA TEST) test strip, Use as instructed 4 times daily. E11.65, Disp: 150 each, Rfl: 1 .  guaiFENesin (MUCINEX) 600 MG 12 hr tablet, Take 1,200 mg by mouth 2 (two) times daily. , Disp: , Rfl:  .  HUMALOG KWIKPEN 100 UNIT/ML KiwkPen, Inject 0.32-0.38 mLs (32-38 Units total) into the skin 3 (three) times daily. (Patient taking differently: Inject 22-26 Units into the skin 3 (three) times daily before meals. ), Disp: 30 pen, Rfl: 2 .  HYDROcodone-acetaminophen (LORTAB) 7.5-500 MG tablet, Take 1 tablet by mouth  every 6 (six) hours as needed for pain., Disp: , Rfl:  .  Insulin Detemir (LEVEMIR FLEXTOUCH) 100 UNIT/ML Pen, Inject 90 Units into the skin daily at 10 pm. (Patient taking differently: Inject 100 Units into the skin at bedtime. ), Disp: 30 pen, Rfl: 2 .  losartan (COZAAR) 50 MG tablet, Take 1 tablet (50 mg total) by mouth daily., Disp: 90 tablet, Rfl: 3 .  magnesium oxide (MAG-OX) 400 MG tablet, TAKE ONE TABLET ONCE DAILY, Disp: 30 tablet, Rfl: 11 .  metoprolol tartrate (LOPRESSOR) 25 MG tablet, TAKE ONE TABLET BY MOUTH TWICE A  DAY, Disp: 60 tablet, Rfl: 6 .  nitroGLYCERIN (NITROSTAT) 0.4 MG SL tablet, Place 1 tablet (0.4 mg total) under the tongue every 5 (five) minutes as needed. For chest pain (Patient taking differently: Place 0.4 mg under the tongue every 5 (five) minutes as needed. For chest pain), Disp: 25 tablet, Rfl: 3 .  olopatadine (PATANOL) 0.1 % ophthalmic solution, Place 1 drop into both eyes 2 (two) times daily. (Patient taking differently: Place 1 drop into both eyes 2 (two) times daily as needed (for itchy/irritated eyes). ), Disp: 5 mL, Rfl: 12 .  Probiotic Product (ALIGN PO), Take 1 tablet by mouth daily. , Disp: , Rfl:  .  sildenafil (REVATIO) 20 MG tablet, Take 3 -5 tabs as needed for erectile dysfunction, Disp: 20 tablet, Rfl: 4 .  traZODone (DESYREL) 100 MG tablet, TAKE 1/2 TO 1 TABLET AT BEDTIME AS NEEDED FOR SLEEP (Patient taking differently: TAKE 1 TABLET (100 MG) BY MOUTH AT BEDTIME), Disp: 30 tablet, Rfl: 5 .  UNIFINE PENTIPS 31G X 6 MM MISC, USE PEN NEEDLES 4 TIMES A DAY, Disp: 150 each, Rfl: 0 .  vitamin B-12 (CYANOCOBALAMIN) 1000 MCG tablet, Take 1,000 mcg by mouth daily. Reported on 08/25/2015, Disp: , Rfl:  .  Vitamin D, Ergocalciferol, (DRISDOL) 50000 units CAPS capsule, TAKE ONE CAPSULE BY MOUTH ONCE A WEEK (Patient taking differently: TAKE ONE CAPSULE BY MOUTH ONCE A MONTH -3RD OF MONTH), Disp: 12 capsule, Rfl: 0  Past Medical History: Past Medical History:  Diagnosis Date  . Allergic rhinitis, cause unspecified   . Anxiety and depression   . ASCVD (arteriosclerotic cardiovascular disease)    70% mid left anterior descending lesion on cath in 06/1995; left anterior desending DES placed in 8/03 and RCA stent in 9/03; captain 3/05 revealed 90% second marginal for which PCI was performed, 70% PDA and a total obstruction of the first diagonal and marginal; sudden cardiac death in [place] in November 12, 2003 for which automatic implantable cardiac defibrillator placed; negativ stress nuclear 10/07  .  Atrial fibrillation ([place])   . Benign prostatic hypertrophy   . C. difficile diarrhea   . CAD (coronary artery disease) 1997  . CHF (congestive heart failure) ([place])   . COPD (chronic obstructive pulmonary disease) ([place])   . Coronary artery disease   . CVD (cerebrovascular disease) 05/2008   Transient ischemic attack; carotid ultrasound-plaque without focal disease  . Degenerative joint disease 2002   C-spine fusion   . Diabetes mellitus    Insulin requirement  . Diabetes mellitus without mention of complication   . Diabetic peripheral neuropathy ([place]) 09/06/2014  . Erectile dysfunction   . Esophageal reflux   . HOH (hard of hearing)   . Hx-TIA (transient ischemic attack) 2010  . Hyperlipidemia   . Hypertension   . ICD (implantable cardiac defibrillator) in place 2005   [employer] ICD, for SCD  . Memory deficits  09/05/2013  . Myocardial infarction   . Other testicular hypofunction   . Pacemaker   . S/P endoscopy Dec 2011   RMR: nl esophagus, hyperplastic polyp, active gastritis, no H.pylori.   . Shortness of breath   . Stroke ([place])   . Tobacco abuse    100 pack/year comsuption; cigarettes discontinued 2003; all tobacco products in 2008  . Tubular adenoma   . Vitreous floaters of left eye     Tobacco Use: History  Smoking Status  . Former Smoker  . Packs/day: 3.00  . Years: 40.00  . Types: Cigarettes  . Start date: 05/03/1954  . Quit date: 05/03/1998  Smokeless Tobacco  . Current User  . Types: Chew    Labs: Recent Review Flowsheet Data    Labs for ITP Cardiac and Pulmonary Rehab Latest Ref Rng & Units 07/25/2013 11/06/2014 02/06/2015 05/15/2015 11/21/2015   Cholestrol 125 - 200 mg/dL 154 - - - 125   LDLCALC <130 mg/dL 92 - - - 55   HDL >=40 mg/dL 31(L) - - - 34(L)   Trlycerides <150 mg/dL 155(H) - - - 178(H)   Hemoglobin A1c <5.7 % - 10.2(A) 9.4(A) 9.9(H) 9.0(H)   PHART 7.350 - 7.450 - - - - -   PCO2ART 35.0 - 45.0 mmHg - - - - -   HCO3 20.0 - 24.0 mEq/L - - - - -   TCO2 0 -  100 mmol/L - - - - -   ACIDBASEDEF 0.0 - 2.0 mmol/L - - - - -   O2SAT % - - - - -      Capillary Blood Glucose: Lab Results  Component Value Date   GLUCAP 226 (H) 05/10/2016   GLUCAP 166 (H) 07/09/2015   GLUCAP 287 (H) 07/08/2015   GLUCAP 222 (H) 07/08/2015   GLUCAP 118 (H) 07/08/2015     ADL UCSD:     Pulmonary Assessment Scores    Row Name 02/26/16 1340         ADL UCSD   ADL Phase Entry     SOB Score total 114     Rest 2     Walk 6     Stairs 4     Bath 2     Dress 2     Shop 3       CAT Score   CAT Score 26       mMRC Score   mMRC Score 4        Pulmonary Function Assessment:     Pulmonary Function Assessment - 02/26/16 1337      Pulmonary Function Tests   FVC% 87 %   FEV1% 88 %   FEV1/FVC Ratio 101   RV% 111 %   DLCO% 92 %     Initial Spirometry Results   FVC% 87 %   FEV1% 88 %   FEV1/FVC Ratio 101     Post Bronchodilator Spirometry Results   FVC% 88 %   FEV1% 94 %   FEV1/FVC Ratio 106     Breath   Shortness of Breath Yes;Limiting activity      Exercise Target Goals:    Exercise Program Goal: Individual exercise prescription set with THRR, safety & activity barriers. Participant demonstrates ability to understand and report RPE using BORG scale, to self-measure pulse accurately, and to acknowledge the importance of the exercise prescription.  Exercise Prescription Goal: Starting with aerobic activity 30 plus minutes a day, 3 days per week for initial exercise prescription.  Provide home exercise prescription and guidelines that participant acknowledges understanding prior to discharge.  Activity Barriers & Risk Stratification:     Activity Barriers & Cardiac Risk Stratification - 02/26/16 1141      Activity Barriers & Cardiac Risk Stratification   Activity Barriers Back Problems;Left Knee Replacement;Joint Problems   Cardiac Risk Stratification High      6 Minute Walk:     6 Minute Walk    Row Name 02/26/16 1134          6 Minute Walk   Phase Initial     Distance 950 feet     Distance % Change 0 %     Walk Time 6 minutes     # of Rest Breaks 0     MPH 1.79     METS 2.37     RPE 15     Perceived Dyspnea  14     VO2 Peak 6.7     Symptoms No     Resting HR 69 bpm     Resting BP 130/72     Max Ex. HR 82 bpm     Max Ex. BP 150/76     2 Minute Post BP 138/74        Oxygen Initial Assessment:   Oxygen Re-Evaluation:   Oxygen Discharge (Final Oxygen Re-Evaluation):   Initial Exercise Prescription:     Initial Exercise Prescription - 02/26/16 1000      Date of Initial Exercise RX and Referring Provider   Date 02/26/16   Referring Provider Dr. Harl Bowie     Treadmill   MPH 1   Grade 0   Minutes 15   METs 1.6     Arm Ergometer   Level 2   Watts 15   Minutes 20   METs 1.9     Prescription Details   Frequency (times per week) 3   Duration Progress to 30 minutes of continuous aerobic without signs/symptoms of physical distress     Intensity   THRR REST +  20   THRR 40-80% of Max Heartrate [phone]   Ratings of Perceived Exertion 11-13   Perceived Dyspnea 0-4     Progression   Progression Continue progressive overload as per policy without signs/symptoms or physical distress.     Resistance Training   Training Prescription Yes   Weight 1   Reps 10-12      Perform Capillary Blood Glucose checks as needed.  Exercise Prescription Changes:      Exercise Prescription Changes    Row Name 03/09/16 1200 04/08/16 1500 05/10/16 0800 06/08/16 1200 07/05/16 1200     Response to Exercise   Blood Pressure (Admit) 128/62 156/80 124/68 114/60 140/78   Blood Pressure (Exercise) 130/68 162/72 154/72 130/68 140/78   Blood Pressure (Exit) 146/70 140/78 120/60 132/70 140/72   Heart Rate (Admit) 70 bpm 74 bpm 62 bpm 76 bpm 77 bpm   Heart Rate (Exercise) 87 bpm 94 bpm 80 bpm 88 bpm 90 bpm   Heart Rate (Exit) 74 bpm 83 bpm 74 bpm 87 bpm 86 bpm   Oxygen Saturation (Admit) 96 % 95 %  95 % 97 % 95 %   Oxygen Saturation (Exercise) 96 % 94 % 90 % 94 % 91 %   Oxygen Saturation (Exit) 94 % 97 % 97 % 92 % 96 %   Rating of Perceived Exertion (Exercise) 9 8 9 9 9    Perceived Dyspnea (Exercise) 9 11 11 12  12  Duration Progress to 30 minutes of continuous aerobic without signs/symptoms of physical distress Progress to 30 minutes of continuous aerobic without signs/symptoms of physical distress Progress to 30 minutes of continuous aerobic without signs/symptoms of physical distress Progress to 30 minutes of continuous aerobic without signs/symptoms of physical distress Progress to 30 minutes of  aerobic without signs/symptoms of physical distress   Intensity Rest + 20 Rest + 20 Rest + 20 Rest + 20 THRR unchanged     Progression   Progression Continue progressive overload as per policy without signs/symptoms or physical distress. Continue progressive overload as per policy without signs/symptoms or physical distress. Continue progressive overload as per policy without signs/symptoms or physical distress. Continue progressive overload as per policy without signs/symptoms or physical distress. Continue to progress workloads to maintain intensity without signs/symptoms of physical distress.     Resistance Training   Training Prescription Yes Yes Yes Yes Yes   Weight 3 4 5 5 5    Reps 10-12 10-12 10-12 10-12 10-15     Treadmill   MPH 1.6 1.8 1.9 1.9 2.1   Grade 0 0 0 0 0   Minutes 15 15 15 15 15    METs 2.2 2.3 2.4 2.4 2.6     Arm Ergometer   Level 2.6 2.8 2.9 3.3 3.1   Watts 8 30 30  38 30   Minutes 20 20 20 20 20    METs 2.6 3.2 3.1 3.8 2.8     Home Exercise Plan   Plans to continue exercise at [facility] 2 additional days to program exercise sessions. Add 2 additional days to program exercise sessions. Add 2 additional days to program exercise sessions. Add 2 additional days to program exercise sessions. Add 2 additional days to program exercise  sessions.     Exercise Review   Progression Yes Yes Yes Yes Yes   Row Name 08/02/16 1300             Response to Exercise   Blood Pressure (Admit) 140/78       Blood Pressure (Exercise) 140/78       Blood Pressure (Exit) 140/72       Heart Rate (Admit) 77 bpm       Heart Rate (Exercise) 90 bpm       Heart Rate (Exit) 86 bpm       Oxygen Saturation (Admit) 95 %       Oxygen Saturation (Exercise) 91 %       Oxygen Saturation (Exit) 96 %       Rating of Perceived Exertion (Exercise) 9       Perceived Dyspnea (Exercise) 12       Duration Progress to 30 minutes of  aerobic without signs/symptoms of physical distress       Intensity THRR unchanged         Progression   Progression Continue to progress workloads to maintain intensity without signs/symptoms of physical distress.         Resistance Training   Training Prescription Yes       Weight 5       Reps 10-15         Treadmill   MPH 2.1       Grade 0       Minutes 15       METs 2.6         Arm Ergometer   Level 3.1  Watts 30       Minutes 20       METs 2.8         Home Exercise Plan   Frequency Add 2 additional days to program exercise sessions.         Exercise Review   Progression Yes          Exercise Comments:      Exercise Comments    Row Name 03/09/16 1223 04/08/16 1510 05/10/16 0849 06/08/16 1251 07/05/16 1255   Exercise Comments Patient is proggressing well. Patient is progressing appropriately  Patient is proggressing appropriately Patient is progressing well Patient is progressing well and handling the treadmill speecd increase   Row Name 07/21/16 1116 08/02/16 1325         Exercise Comments Patient has not progressed due to lack of attendance.  Patient has not returned to [facility] since 07/01/2016         Exercise Goals and Review:   Exercise Goals Re-Evaluation :     Exercise Goals Re-Evaluation    [place] Name 07/06/16 1558             Exercise Goal Re-Evaluation   Exercise Goals  Review Increase Strenth and Stamina       Comments Patient is progressing with increased strength and stamina.        Expected Outcomes Patient will continue to progress in the program.           Discharge Exercise Prescription (Final Exercise Prescription Changes):     Exercise Prescription Changes - 08/02/16 1300      Response to Exercise   Blood Pressure (Admit) 140/78   Blood Pressure (Exercise) 140/78   Blood Pressure (Exit) 140/72   Heart Rate (Admit) 77 bpm   Heart Rate (Exercise) 90 bpm   Heart Rate (Exit) 86 bpm   Oxygen Saturation (Admit) 95 %   Oxygen Saturation (Exercise) 91 %   Oxygen Saturation (Exit) 96 %   Rating of Perceived Exertion (Exercise) 9   Perceived Dyspnea (Exercise) 12   Duration Progress to 30 minutes of  aerobic without signs/symptoms of physical distress   Intensity THRR unchanged     Progression   Progression Continue to progress workloads to maintain intensity without signs/symptoms of physical distress.     Resistance Training   Training Prescription Yes   Weight 5   Reps 10-15     Treadmill   MPH 2.1   Grade 0   Minutes 15   METs 2.6     Arm Ergometer   Level 3.1   Watts 30   Minutes 20   METs 2.8     Home Exercise Plan   Frequency Add 2 additional days to program exercise sessions.     Exercise Review   Progression Yes      Nutrition:  Target Goals: Understanding of nutrition guidelines, daily intake of sodium 1500mg , cholesterol 200mg , calories 30% from fat and 7% or less from saturated fats, daily to have 5 or more servings of fruits and vegetables.  Biometrics:     Pre Biometrics - 02/26/16 1037      Pre Biometrics   Height 5\' 9"  (1.753 m)   Weight 236 lb 12.4 oz (107.4 kg)   Waist Circumference 44.5 inches   Hip Circumference 42.5 inches   Waist to Hip Ratio 1.05 %   BMI (Calculated) 35   Triceps Skinfold 6 mm   % Body Fat 28.6 %   Grip Strength  81.33 kg   Flexibility 0 in   Single Leg Stand 3 seconds        Nutrition Therapy Plan and Nutrition Goals:   Nutrition Discharge: Rate Your Plate Scores:     Nutrition Assessments - 02/26/16 1344      Rate Your Plate Scores   Pre Score 17      Nutrition Goals Re-Evaluation:   Nutrition Goals Discharge (Final Nutrition Goals Re-Evaluation):   Psychosocial: Target Goals: Acknowledge presence or absence of significant depression and/or stress, maximize coping skills, provide positive support system. Participant is able to verbalize types and ability to use techniques and skills needed for reducing stress and depression.  Initial Review & Psychosocial Screening:     Initial Psych Review & Screening - 02/26/16 1402      Initial Review   Current issues with Current Anxiety/Panic  Main concern is being active again w/o SOB.     Family Dynamics   Good Support System? Yes     Barriers   Psychosocial barriers to participate in program Psychosocial barriers identified (see note);The patient should benefit from training in stress management and relaxation.  Though the patients QOL was 23.09, his PHQ-9 was 9. He said he feels depressed and hopeless. He siad it is more based in the fact he can't do anything without getting breathless.       Screening Interventions   Interventions Encouraged to exercise      Quality of Life Scores:     Quality of Life - 02/26/16 1038      Quality of Life Scores   Health/Function Pre 19.5 %   Socioeconomic Pre 24.86 %   Psych/Spiritual Pre 29.14 %   Family Pre 24 %   GLOBAL Pre 23.09 %      PHQ-9: Recent Review Flowsheet Data    Depression screen [facility] 2/9 02/26/2016 11/25/2015 08/25/2015 07/04/2015 06/04/2015   Decreased Interest 3 0 0 0 0   Down, Depressed, Hopeless 3 0 0 0 0   PHQ - 2 Score 6 0 0 0 0   Altered sleeping 0 - - - -   Tired, decreased energy 3 - - - -   Change in appetite 0 - - - -   Feeling bad or failure about yourself  0 - - - -   Trouble concentrating 0 - - - -   Moving  slowly or fidgety/restless 0 - - - -   PHQ-9 Score 9 - - - -   Difficult doing work/chores Not difficult at all - - - -     Interpretation of Total Score  Total Score Depression Severity:  1-4 = Minimal depression, 5-9 = Mild depression, 10-14 = Moderate depression, 15-19 = Moderately severe depression, 20-27 = Severe depression   Psychosocial Evaluation and Intervention:     Psychosocial Evaluation - 02/26/16 1406      Psychosocial Evaluation & Interventions   Interventions Stress management education;Relaxation education;Encouraged to exercise with the program and follow exercise prescription   Continue Psychosocial Services  Yes  The patient does not feel he needs counseling. From my observation he would benefit from consulting with a counselor.       Psychosocial Re-Evaluation:     Psychosocial Re-Evaluation    [place] Name 03/17/16 1441 04/14/16 1501 05/12/16 1339 06/10/16 0806 07/06/16 1600     Psychosocial Re-Evaluation   Current issues with  -  -  -  - -  Was feeling down and depressed at time.  Comments Patient's QOL score was 23.09. He says he is not depressed and does not need counseling.  Patient continues to have no psychosicoal issues identified.  Patient says he does feel depressed at times d/t his SOB. He believes if he could get his strength back, he would feel better mentally. Will continue to monitor.  Patient continue to have no psychosocial issues identified.  Patient is feeling better since his SOB has improved and he is able work.    Expected Outcomes  -  -  -  - Patient's QOL and PHQ-9 scores will improve at dischage.    Interventions Encouraged to attend Pulmonary Rehabilitation for the exercise Encouraged to attend Pulmonary Rehabilitation for the exercise Encouraged to attend Pulmonary Rehabilitation for the exercise;Stress management education Encouraged to attend Pulmonary Rehabilitation for the exercise Encouraged to attend Pulmonary Rehabilitation for the  exercise   Continue Psychosocial Services  No No No No Follow up required by staff      Psychosocial Discharge (Final Psychosocial Re-Evaluation):     Psychosocial Re-Evaluation - 07/06/16 1600      Psychosocial Re-Evaluation   Current issues with --  Was feeling down and depressed at time.    Comments Patient is feeling better since his SOB has improved and he is able work.    Expected Outcomes Patient's QOL and PHQ-9 scores will improve at dischage.    Interventions Encouraged to attend Pulmonary Rehabilitation for the exercise   Continue Psychosocial Services  Follow up required by staff       Education: Education Goals: Education classes will be provided on a weekly basis, covering required topics. Participant will state understanding/return demonstration of topics presented.  Learning Barriers/Preferences:     Learning Barriers/Preferences - 02/26/16 1142      Learning Barriers/Preferences   Learning Barriers None   Learning Preferences Individual Instruction;Skilled Demonstration;Group Instruction      Education Topics: How Lungs Work and Diseases: - Discuss the anatomy of the lungs and diseases that can affect the lungs, such as COPD.   PULMONARY REHAB OTHER RESPIRATORY from 07/01/2016 in [place]  Date  (P) 03/04/16  Educator  (P) CG  Instruction Review Code  (P) 2- meets goals/outcomes      Exercise: -Discuss the importance of exercise, FITT principles of exercise, normal and abnormal responses to exercise, and how to exercise safely.   PULMONARY REHAB OTHER RESPIRATORY from 07/01/2016 in [place]  Date  (P) 03/11/16  Educator  (P) [place]  Instruction Review Code  (P) 2- meets goals/outcomes      Environmental Irritants: -Discuss types of environmental irritants and how to limit exposure to environmental irritants.   PULMONARY REHAB OTHER RESPIRATORY from 07/01/2016 in [place]  Date   (P) 03/18/16  Educator  (P) Nils Flack  Instruction Review Code  (P) 2- meets goals/outcomes      Meds/Inhalers and oxygen: - Discuss respiratory medications, definition of an inhaler and oxygen, and the proper way to use an inhaler and oxygen.   PULMONARY REHAB OTHER RESPIRATORY from 07/01/2016 in [place]  Date  (P) 06/24/16  Educator  (P) GC  Instruction Review Code  (P) 2- meets goals/outcomes      Energy Saving Techniques: - Discuss methods to conserve energy and decrease shortness of breath when performing activities of daily living.    PULMONARY REHAB OTHER RESPIRATORY from 07/01/2016 in [place]  Date  (P) 04/01/16  Educator  (  P) Suzanne Boron  Instruction Review Code  (P) 2- meets goals/outcomes      Bronchial Hygiene / Breathing Techniques: - Discuss breathing mechanics, pursed-lip breathing technique,  proper posture, effective ways to clear airways, and other functional breathing techniques   PULMONARY REHAB OTHER RESPIRATORY from 07/01/2016 in [place]  Date  (P) 04/08/16  Educator  (P) Nils Flack   Instruction Review Code  (P) 2- meets goals/outcomes      Cleaning Equipment: - Provides group verbal and written instruction about the health risks of elevated stress, cause of high stress, and healthy ways to reduce stress.   Nutrition I: Fats: - Discuss the types of cholesterol, what cholesterol does to the body, and how cholesterol levels can be controlled.   PULMONARY REHAB OTHER RESPIRATORY from 07/01/2016 in [place]  Date  (P) 04/15/16  Educator  (P) Nils Flack  Instruction Review Code  (P) 2- meets goals/outcomes      Nutrition II: Labels: -Discuss the different components of food labels and how to read food labels.   PULMONARY REHAB OTHER RESPIRATORY from 07/01/2016 in [place]  Date  (P) 04/29/16  Educator  (P) Suzanne Boron   Instruction Review Code  (P) 2- meets goals/outcomes      Respiratory Infections: - Discuss the signs and symptoms of respiratory infections, ways to prevent respiratory infections, and the importance of seeking medical treatment when having a respiratory infection.   Stress I: Signs and Symptoms: - Discuss the causes of stress, how stress may lead to anxiety and depression, and ways to limit stress.   Stress II: Relaxation: -Discuss relaxation techniques to limit stress.   Oxygen for Home/Travel: - Discuss how to prepare for travel when on oxygen and proper ways to transport and store oxygen to ensure safety.   PULMONARY REHAB OTHER RESPIRATORY from 07/01/2016 in [place]  Date  (P) 05/27/16  Educator  (P) [place]  Instruction Review Code  (P) 2- meets goals/outcomes      Knowledge Questionnaire Score:     Knowledge Questionnaire Score - 02/26/16 1335      Knowledge Questionnaire Score   Pre Score 10/14      Core Components/Risk Factors/Patient Goals at Admission:     Personal Goals and Risk Factors at Admission - 02/26/16 1344      Core Components/Risk Factors/Patient Goals on Admission    Weight Management Yes   Intervention Weight Management/Obesity: Establish reasonable short term and long term weight goals.   Admit Weight 237 lb (107.5 kg)   Goal Weight: Short Term 227 lb (103 kg)   Goal Weight: Long Term 217 lb (98.4 kg)   Expected Outcomes Short Term: Continue to assess and modify interventions until short term weight is achieved;Long Term: Adherence to nutrition and physical activity/exercise program aimed toward attainment of established weight goal   Sedentary Yes   Intervention Provide advice, education, support and counseling about physical activity/exercise needs.;Develop an individualized exercise prescription for aerobic and resistive training based on initial evaluation findings, risk stratification, comorbidities and participant's  personal goals.   Expected Outcomes Achievement of increased cardiorespiratory fitness and enhanced flexibility, muscular endurance and strength shown through measurements of functional capacity and personal statement of participant.   Increase Strength and Stamina Yes   Intervention Provide advice, education, support and counseling about physical activity/exercise needs.;Develop an individualized exercise prescription for aerobic and resistive training based on initial evaluation findings, risk stratification, comorbidities and participant's  personal goals.   Expected Outcomes Achievement of increased cardiorespiratory fitness and enhanced flexibility, muscular endurance and strength shown through measurements of functional capacity and personal statement of participant.   Improve shortness of breath with ADL's Yes   Intervention Provide education, individualized exercise plan and daily activity instruction to help decrease symptoms of SOB with activities of daily living.   Expected Outcomes Short Term: Achieves a reduction of symptoms when performing activities of daily living.   Develop more efficient breathing techniques such as purse lipped breathing and diaphragmatic breathing; and practicing self-pacing with activity Yes   Intervention Provide education, demonstration and support about specific breathing techniuqes utilized for more efficient breathing. Include techniques such as pursed lipped breathing, diaphragmatic breathing and self-pacing activity.   Expected Outcomes Short Term: Participant will be able to demonstrate and use breathing techniques as needed throughout daily activities.   Diabetes Yes   Intervention Provide education about signs/symptoms and action to take for hypo/hyperglycemia.   Expected Outcomes Long Term: Attainment of HbA1C < 7%.;Short Term: Participant verbalizes understanding of the signs/symptoms and immediate care of hyper/hypoglycemia, proper foot care and  importance of medication, aerobic/resistive exercise and nutrition plan for blood glucose control.   Stress Yes   Intervention Offer individual and/or small group education and counseling on adjustment to heart disease, stress management and health-related lifestyle change. Teach and support self-help strategies.   Expected Outcomes Short Term: Participant demonstrates changes in health-related behavior, relaxation and other stress management skills, ability to obtain effective social support, and compliance with psychotropic medications if prescribed.;Long Term: Emotional wellbeing is indicated by absence of clinically significant psychosocial distress or social isolation.   Personal Goal Other Yes   Personal Goal Lose weight 10-20lbs before graduation. then continue to lose up to 50lbs. Breath better.   Intervention Attend Pulmonary Rehab 2 x week and then supplement exercise at home with 3 x week.    Expected Outcomes Reach personal goals.       Core Components/Risk Factors/Patient Goals Review:      Goals and Risk Factor Review    Row Name 02/26/16 1402 03/17/16 1440 04/14/16 1459 05/12/16 1335 06/10/16 0804     Core Components/Risk Factors/Patient Goals Review   Personal Goals Review Weight Management/Obesity;Improve shortness of breath with ADL's;Develop more efficient breathing techniques such as purse lipped breathing and diaphragmatic breathing and practicing self-pacing with activity.;Diabetes;Stress Weight Management/Obesity;Increase Strength and Stamina;Improve shortness of breath with ADL's;Develop more efficient breathing techniques such as purse lipped breathing and diaphragmatic breathing and practicing self-pacing with activity.;Diabetes;Stress Weight Management/Obesity;Sedentary;Improve shortness of breath with ADL's;Increase Strength and Stamina;Develop more efficient breathing techniques such as purse lipped breathing and diaphragmatic breathing and practicing self-pacing with  activity.;Stress;Increase knowledge of respiratory medications and ability to use respiratory devices properly.  Lose weight 10-20lbs before graduation. then continue to lose up to 50lbs. Breath better. Weight Management/Obesity;Increase Strength and Stamina;Improve shortness of breath with ADL's;Diabetes;Stress;Develop more efficient breathing techniques such as purse lipped breathing and diaphragmatic breathing and practicing self-pacing with activity.  Lose weight 10-20lbs before graduation. then continue to lose up to 50lbs. Breath better. Weight Management/Obesity;Sedentary;Increase Strength and Stamina;Develop more efficient breathing techniques such as purse lipped breathing and diaphragmatic breathing and practicing self-pacing with activity.;Stress;Improve shortness of breath with ADL's;Increase knowledge of respiratory medications and ability to use respiratory devices properly.  Lose 10-20 lbs before graduation; continue to lose 50 lbs. Breathe better.    Review  - Patient new to program. He has attended 6 sessions. Will continue to  monitor for progress.  Patient has attended 12 sessions losing 2 lbs. He is doing well in the program with some increased strength and stamina and some improvement in his SOB.  Patient has attended 15 sessions. He has maintained his weight. He missed the last 3 sessions d/t illness. He is progressing well with improvement in his strength, stamina and SOB.  Patient has attended 19 sessions. He has gained 3 lbs not meeting his weight loss goals. He is progressing in the program with increased strength and stamina and improved SOB. He says the program is helping and he is more active.    Expected Outcomes  - Patient will complete the program meeting his personal goals.  Patient will continue to attend sessions meeting his personal goals.  Patient will continue to attend sessions and complete the program meeting his personal goals.  Patient will continue to attend sessions  meeting his personal goals.     [place] Name 07/06/16 1555             Core Components/Risk Factors/Patient Goals Review   Personal Goals Review Weight Management/Obesity;Improve shortness of breath with ADL's;Increase knowledge of respiratory medications and ability to use respiratory devices properly.;Develop more efficient breathing techniques such as purse lipped breathing and diaphragmatic breathing and practicing self-pacing with activity.  Lose 10 lbs/month. Lose a total of 50 lbs. Breath better.        Review Patient has completed 24 sessions. He has gained 3 lbs. His SOB has improved. He has returned to work full time without difficulty.        Expected Outcomes Patient will complete the program. He will start to lose weight and his SOB will continue to improve.           Core Components/Risk Factors/Patient Goals at Discharge (Final Review):      Goals and Risk Factor Review - 07/06/16 1555      Core Components/Risk Factors/Patient Goals Review   Personal Goals Review Weight Management/Obesity;Improve shortness of breath with ADL's;Increase knowledge of respiratory medications and ability to use respiratory devices properly.;Develop more efficient breathing techniques such as purse lipped breathing and diaphragmatic breathing and practicing self-pacing with activity.  Lose 10 lbs/month. Lose a total of 50 lbs. Breath better.    Review Patient has completed 24 sessions. He has gained 3 lbs. His SOB has improved. He has returned to work full time without difficulty.    Expected Outcomes Patient will complete the program. He will start to lose weight and his SOB will continue to improve.       ITP Comments:     ITP Comments    Row Name 08/06/16 1303 08/11/16 1411         ITP Comments Patient has not attended since last 30 Day review. Patient will not return phone calls. A letter of discharge has been mailed. Patient stopped coming after completing 24 sessions. No discharge  documentation completed.          Comments: Patient stopped coming to Pulmonary Rehab on 07/01/16 after completing 24 sessions. Several attempts were made to contact the patient. Doctor will be informed.

## 2016-08-11 NOTE — Addendum Note (Signed)
Encounter addended by: Dwana Melena, RN on: 08/11/2016  2:18 PM<BR>    Actions taken: Visit Navigator Flowsheet section accepted, Sign clinical note, Episode resolved

## 2016-08-11 NOTE — Progress Notes (Signed)
Discharge Summary  Patient Details  Name: Lance [name] MRN: [id number] Date of Birth: 1945/09/15 Referring Provider:     PULMONARY REHAB OTHER RESP ORIENTATION from 02/26/2016 in [place]  Referring Provider  Dr. Harl Bowie       Number of Visits: 24  Reason for Discharge:  Early Exit:  Patient stopped after completing 24 sessions. Several attempts were made to contact patient.   Smoking History:  History  Smoking Status  . Former Smoker  . Packs/day: 3.00  . Years: 40.00  . Types: Cigarettes  . Start date: 05/03/1954  . Quit date: 05/03/1998  Smokeless Tobacco  . Current User  . Types: Chew    Diagnosis:  SOB (shortness of breath)  Asthma in adult without complication, unspecified asthma severity, unspecified whether persistent  ADL UCSD:     Pulmonary Assessment Scores    Row Name 02/26/16 1340         ADL UCSD   ADL Phase Entry     SOB Score total 114     Rest 2     Walk 6     Stairs 4     Bath 2     Dress 2     Shop 3       CAT Score   CAT Score 26       mMRC Score   mMRC Score 4        Initial Exercise Prescription:     Initial Exercise Prescription - 02/26/16 1000      Date of Initial Exercise RX and Referring Provider   Date 02/26/16   Referring Provider Dr. Harl Bowie     Treadmill   MPH 1   Grade 0   Minutes 15   METs 1.6     Arm Ergometer   Level 2   Watts 15   Minutes 20   METs 1.9     Prescription Details   Frequency (times per week) 3   Duration Progress to 30 minutes of continuous aerobic without signs/symptoms of physical distress     Intensity   THRR REST +  20   THRR 40-80% of Max Heartrate [phone]   Ratings of Perceived Exertion 11-13   Perceived Dyspnea 0-4     Progression   Progression Continue progressive overload as per policy without signs/symptoms or physical distress.     Resistance Training   Training Prescription Yes   Weight 1   Reps 10-12      Discharge Exercise  Prescription (Final Exercise Prescription Changes):     Exercise Prescription Changes - 08/02/16 1300      Response to Exercise   Blood Pressure (Admit) 140/78   Blood Pressure (Exercise) 140/78   Blood Pressure (Exit) 140/72   Heart Rate (Admit) 77 bpm   Heart Rate (Exercise) 90 bpm   Heart Rate (Exit) 86 bpm   Oxygen Saturation (Admit) 95 %   Oxygen Saturation (Exercise) 91 %   Oxygen Saturation (Exit) 96 %   Rating of Perceived Exertion (Exercise) 9   Perceived Dyspnea (Exercise) 12   Duration Progress to 30 minutes of  aerobic without signs/symptoms of physical distress   Intensity THRR unchanged     Progression   Progression Continue to progress workloads to maintain intensity without signs/symptoms of physical distress.     Resistance Training   Training Prescription Yes   Weight 5   Reps 10-15     Treadmill   MPH 2.1   Grade  0   Minutes 15   METs 2.6     Arm Ergometer   Level 3.1   Watts 30   Minutes 20   METs 2.8     Home Exercise Plan   Frequency Add 2 additional days to program exercise sessions.     Exercise Review   Progression Yes      Functional Capacity:     6 Minute Walk    Row Name 02/26/16 1134         6 Minute Walk   Phase Initial     Distance 950 feet     Distance % Change 0 %     Walk Time 6 minutes     # of Rest Breaks 0     MPH 1.79     METS 2.37     RPE 15     Perceived Dyspnea  14     VO2 Peak 6.7     Symptoms No     Resting HR 69 bpm     Resting BP 130/72     Max Ex. HR 82 bpm     Max Ex. BP 150/76     2 Minute Post BP 138/74        Psychological, QOL, Others - Outcomes: PHQ 2/9: Depression screen [facility] 2/9 02/26/2016 11/25/2015 08/25/2015 07/04/2015 06/04/2015  Decreased Interest 3 0 0 0 0  Down, Depressed, Hopeless 3 0 0 0 0  PHQ - 2 Score 6 0 0 0 0  Altered sleeping 0 - - - -  Tired, decreased energy 3 - - - -  Change in appetite 0 - - - -  Feeling bad or failure about yourself  0 - - - -  Trouble concentrating  0 - - - -  Moving slowly or fidgety/restless 0 - - - -  PHQ-9 Score 9 - - - -  Difficult doing work/chores Not difficult at all - - - -  Some recent data might be hidden    Quality of Life:     Quality of Life - 02/26/16 1038      Quality of Life Scores   Health/Function Pre 19.5 %   Socioeconomic Pre 24.86 %   Psych/Spiritual Pre 29.14 %   Family Pre 24 %   GLOBAL Pre 23.09 %      Personal Goals: Goals established at orientation with interventions provided to work toward goal.     Personal Goals and Risk Factors at Admission - 02/26/16 1344      Core Components/Risk Factors/Patient Goals on Admission    Weight Management Yes   Intervention Weight Management/Obesity: Establish reasonable short term and long term weight goals.   Admit Weight 237 lb (107.5 kg)   Goal Weight: Short Term 227 lb (103 kg)   Goal Weight: Long Term 217 lb (98.4 kg)   Expected Outcomes Short Term: Continue to assess and modify interventions until short term weight is achieved;Long Term: Adherence to nutrition and physical activity/exercise program aimed toward attainment of established weight goal   Sedentary Yes   Intervention Provide advice, education, support and counseling about physical activity/exercise needs.;Develop an individualized exercise prescription for aerobic and resistive training based on initial evaluation findings, risk stratification, comorbidities and participant's personal goals.   Expected Outcomes Achievement of increased cardiorespiratory fitness and enhanced flexibility, muscular endurance and strength shown through measurements of functional capacity and personal statement of participant.   Increase Strength and Stamina Yes   Intervention Provide advice, education,  support and counseling about physical activity/exercise needs.;Develop an individualized exercise prescription for aerobic and resistive training based on initial evaluation findings, risk stratification,  comorbidities and participant's personal goals.   Expected Outcomes Achievement of increased cardiorespiratory fitness and enhanced flexibility, muscular endurance and strength shown through measurements of functional capacity and personal statement of participant.   Improve shortness of breath with ADL's Yes   Intervention Provide education, individualized exercise plan and daily activity instruction to help decrease symptoms of SOB with activities of daily living.   Expected Outcomes Short Term: Achieves a reduction of symptoms when performing activities of daily living.   Develop more efficient breathing techniques such as purse lipped breathing and diaphragmatic breathing; and practicing self-pacing with activity Yes   Intervention Provide education, demonstration and support about specific breathing techniuqes utilized for more efficient breathing. Include techniques such as pursed lipped breathing, diaphragmatic breathing and self-pacing activity.   Expected Outcomes Short Term: Participant will be able to demonstrate and use breathing techniques as needed throughout daily activities.   Diabetes Yes   Intervention Provide education about signs/symptoms and action to take for hypo/hyperglycemia.   Expected Outcomes Long Term: Attainment of HbA1C < 7%.;Short Term: Participant verbalizes understanding of the signs/symptoms and immediate care of hyper/hypoglycemia, proper foot care and importance of medication, aerobic/resistive exercise and nutrition plan for blood glucose control.   Stress Yes   Intervention Offer individual and/or small group education and counseling on adjustment to heart disease, stress management and health-related lifestyle change. Teach and support self-help strategies.   Expected Outcomes Short Term: Participant demonstrates changes in health-related behavior, relaxation and other stress management skills, ability to obtain effective social support, and compliance with  psychotropic medications if prescribed.;Long Term: Emotional wellbeing is indicated by absence of clinically significant psychosocial distress or social isolation.   Personal Goal Other Yes   Personal Goal Lose weight 10-20lbs before graduation. then continue to lose up to 50lbs. Breath better.   Intervention Attend Pulmonary Rehab 2 x week and then supplement exercise at home with 3 x week.    Expected Outcomes Reach personal goals.        Personal Goals Discharge:     Goals and Risk Factor Review    Row Name 02/26/16 1402 03/17/16 1440 04/14/16 1459 05/12/16 1335 06/10/16 0804     Core Components/Risk Factors/Patient Goals Review   Personal Goals Review Weight Management/Obesity;Improve shortness of breath with ADL's;Develop more efficient breathing techniques such as purse lipped breathing and diaphragmatic breathing and practicing self-pacing with activity.;Diabetes;Stress Weight Management/Obesity;Increase Strength and Stamina;Improve shortness of breath with ADL's;Develop more efficient breathing techniques such as purse lipped breathing and diaphragmatic breathing and practicing self-pacing with activity.;Diabetes;Stress Weight Management/Obesity;Sedentary;Improve shortness of breath with ADL's;Increase Strength and Stamina;Develop more efficient breathing techniques such as purse lipped breathing and diaphragmatic breathing and practicing self-pacing with activity.;Stress;Increase knowledge of respiratory medications and ability to use respiratory devices properly.  Lose weight 10-20lbs before graduation. then continue to lose up to 50lbs. Breath better. Weight Management/Obesity;Increase Strength and Stamina;Improve shortness of breath with ADL's;Diabetes;Stress;Develop more efficient breathing techniques such as purse lipped breathing and diaphragmatic breathing and practicing self-pacing with activity.  Lose weight 10-20lbs before graduation. then continue to lose up to 50lbs. Breath  better. Weight Management/Obesity;Sedentary;Increase Strength and Stamina;Develop more efficient breathing techniques such as purse lipped breathing and diaphragmatic breathing and practicing self-pacing with activity.;Stress;Improve shortness of breath with ADL's;Increase knowledge of respiratory medications and ability to use respiratory devices properly.  Lose 10-20 lbs before graduation;  continue to lose 50 lbs. Breathe better.    Review  - Patient new to program. He has attended 6 sessions. Will continue to monitor for progress.  Patient has attended 12 sessions losing 2 lbs. He is doing well in the program with some increased strength and stamina and some improvement in his SOB.  Patient has attended 15 sessions. He has maintained his weight. He missed the last 3 sessions d/t illness. He is progressing well with improvement in his strength, stamina and SOB.  Patient has attended 19 sessions. He has gained 3 lbs not meeting his weight loss goals. He is progressing in the program with increased strength and stamina and improved SOB. He says the program is helping and he is more active.    Expected Outcomes  - Patient will complete the program meeting his personal goals.  Patient will continue to attend sessions meeting his personal goals.  Patient will continue to attend sessions and complete the program meeting his personal goals.  Patient will continue to attend sessions meeting his personal goals.    [place] Name 07/06/16 1555             Core Components/Risk Factors/Patient Goals Review   Personal Goals Review Weight Management/Obesity;Improve shortness of breath with ADL's;Increase knowledge of respiratory medications and ability to use respiratory devices properly.;Develop more efficient breathing techniques such as purse lipped breathing and diaphragmatic breathing and practicing self-pacing with activity.  Lose 10 lbs/month. Lose a total of 50 lbs. Breath better.        Review Patient has  completed 24 sessions. He has gained 3 lbs. His SOB has improved. He has returned to work full time without difficulty.        Expected Outcomes Patient will complete the program. He will start to lose weight and his SOB will continue to improve.           Nutrition & Weight - Outcomes:     Pre Biometrics - 02/26/16 1037      Pre Biometrics   Height 5\' 9"  (1.753 m)   Weight 236 lb 12.4 oz (107.4 kg)   Waist Circumference 44.5 inches   Hip Circumference 42.5 inches   Waist to Hip Ratio 1.05 %   BMI (Calculated) 35   Triceps Skinfold 6 mm   % Body Fat 28.6 %   Grip Strength 81.33 kg   Flexibility 0 in   Single Leg Stand 3 seconds       Nutrition:   Nutrition Discharge:     Nutrition Assessments - 02/26/16 1344      Rate Your Plate Scores   Pre Score 17      Education Questionnaire Score:     Knowledge Questionnaire Score - 02/26/16 1335      Knowledge Questionnaire Score   Pre Score 10/14

## 2016-08-12 ENCOUNTER — Telehealth: Payer: Self-pay | Admitting: Internal Medicine

## 2016-08-12 MED ORDER — RABEPRAZOLE SODIUM 20 MG PO TBEC
20.0000 mg | DELAYED_RELEASE_TABLET | Freq: Every day | ORAL | 1 refills | Status: DC
Start: 2016-08-12 — End: 2016-09-16

## 2016-08-12 NOTE — Telephone Encounter (Signed)
I sent in an Rx for Aciphex that he can try. Call us in 2 weeks and let us know if there's been any improvement.

## 2016-08-12 NOTE — Telephone Encounter (Signed)
Routing to EG- see other phone note.

## 2016-08-12 NOTE — Telephone Encounter (Signed)
See other phone note

## 2016-08-12 NOTE — Telephone Encounter (Signed)
[facility] called and said they received Aciphex rx, however they are getting an alert about Aciphex interfering with his Plavix. Routing message to EG.

## 2016-08-12 NOTE — Telephone Encounter (Signed)
Please ask if he has tried anything besides Protonix and Dexilant.

## 2016-08-12 NOTE — Telephone Encounter (Signed)
That is the only two thing that he has tried with in the last couple of years. They are not working

## 2016-08-12 NOTE — Telephone Encounter (Signed)
PATIENT CALLED AND STATED HE HAS NOT HEARD BACK FROM HIS FIRST CALL A FEW DAYS AGO ABOUT HIS MEDICATION NOT WORKING, HE ALSO CHANGED HIS APPOINTMENT TO AN EARLIER ONE WITH [facility].  PLEASE ADVISE

## 2016-08-13 ENCOUNTER — Ambulatory Visit (INDEPENDENT_AMBULATORY_CARE_PROVIDER_SITE_OTHER): Payer: Medicare Other | Admitting: Family Medicine

## 2016-08-13 ENCOUNTER — Encounter: Payer: Self-pay | Admitting: Family Medicine

## 2016-08-13 VITALS — BP 124/70 | Temp 99.1°F | Ht 69.0 in | Wt 239.2 lb

## 2016-08-13 DIAGNOSIS — I255 Ischemic cardiomyopathy: Secondary | ICD-10-CM

## 2016-08-13 DIAGNOSIS — M48062 Spinal stenosis, lumbar region with neurogenic claudication: Secondary | ICD-10-CM | POA: Diagnosis not present

## 2016-08-13 NOTE — Progress Notes (Signed)
   Subjective:    Patient ID: Lance [name], male    DOB: 06-01-1945, 71 y.o.   MRN: [id number]  Leg Pain   The incident occurred more than 1 week ago. The pain is present in the left leg and right leg. Exacerbated by: Sitting.  Bilateral leg pain and discomfort into the hips into the thighs sometimes toward the calves worse with walking gets better once he rests he's not been able to walk as far. Denies any particular injuries. States he saw a cardiologist at told him his circulation seem to be good. Has had Doppler tests a proximally year ago according to the patient which were good.   Patient also has concerns of cough he relates low better runny nose and cough he thinks it could be allergies denies wheezing difficulty breathing.   Review of Systems    see above. No chest pain no swelling Objective:   Physical Exam Lungs are clear hearts regular HEENT benign Low back nontender negative straight leg raise       Assessment & Plan:  Low back pain with probable spinal stenosis with neurogenic claudication-he would like to see his back specialist Dr. Tonita Cong he stated he will set up this appointment we will send a copy of this note to them  Viral syndrome if worse call us warning signs discussed

## 2016-08-13 NOTE — Telephone Encounter (Signed)
I checked and all PPIs would interfere with Plavix with "fair" reliability due to inconsistent data. We can give Zantac 150 mg daily a shot and see if that helps. If not will have to re-evaluate.

## 2016-08-16 NOTE — Telephone Encounter (Signed)
It is available OTC or we can send in a prescription (whichever would be more cost-effective for him)

## 2016-08-16 NOTE — Telephone Encounter (Signed)
Called and informed pt. He wants Zantac rx sent to [facility].

## 2016-08-16 NOTE — Telephone Encounter (Signed)
Is the Zantac 150mg  prescription or OTC?

## 2016-08-17 NOTE — Telephone Encounter (Signed)
Tammy at [facility] called, informed her that we were changing to Zantac, she ran a claim for zantac 150mg  daily and it is covered by his insurance with a $3.66 copay. Please send in rx. She is going to void the rx for aciphex.

## 2016-08-18 MED ORDER — RANITIDINE HCL 150 MG PO TABS: 150.0000 mg | ORAL_TABLET | Freq: Every day | ORAL | 3 refills | Status: DC

## 2016-08-18 NOTE — Telephone Encounter (Signed)
Rx sent to pharmacy per patient request. 

## 2016-08-18 NOTE — Addendum Note (Signed)
Addended by: Gordy Levan, Iyanah Demont A on: 08/18/2016 04:41 PM   Modules accepted: Orders

## 2016-08-19 NOTE — Telephone Encounter (Signed)
Called and informed pt that Zantac rx had been sent to pharmacy.

## 2016-08-24 ENCOUNTER — Other Ambulatory Visit (HOSPITAL_COMMUNITY)
Admission: RE | Admit: 2016-08-24 | Discharge: 2016-08-24 | Disposition: A | Discharge status: Home / Self Care | Payer: Medicare Other | Source: Ambulatory Visit | Attending: Cardiology | Admitting: Cardiology

## 2016-08-24 ENCOUNTER — Ambulatory Visit (HOSPITAL_COMMUNITY)
Admission: RE | Admit: 2016-08-24 | Discharge: 2016-08-24 | Disposition: A | Discharge status: Home / Self Care | Payer: Medicare Other | Source: Ambulatory Visit | Attending: Cardiology | Admitting: Cardiology

## 2016-08-24 DIAGNOSIS — E230 Hypopituitarism: Secondary | ICD-10-CM | POA: Diagnosis not present

## 2016-08-24 DIAGNOSIS — I110 Hypertensive heart disease with heart failure: Secondary | ICD-10-CM | POA: Diagnosis not present

## 2016-08-24 DIAGNOSIS — R0602 Shortness of breath: Secondary | ICD-10-CM

## 2016-08-24 DIAGNOSIS — K219 Gastro-esophageal reflux disease without esophagitis: Secondary | ICD-10-CM | POA: Insufficient documentation

## 2016-08-24 DIAGNOSIS — Z95810 Presence of automatic (implantable) cardiac defibrillator: Secondary | ICD-10-CM | POA: Insufficient documentation

## 2016-08-24 DIAGNOSIS — I509 Heart failure, unspecified: Secondary | ICD-10-CM | POA: Insufficient documentation

## 2016-08-24 DIAGNOSIS — Z87891 Personal history of nicotine dependence: Secondary | ICD-10-CM | POA: Diagnosis not present

## 2016-08-24 DIAGNOSIS — I10 Essential (primary) hypertension: Secondary | ICD-10-CM | POA: Diagnosis not present

## 2016-08-24 DIAGNOSIS — E785 Hyperlipidemia, unspecified: Secondary | ICD-10-CM | POA: Diagnosis not present

## 2016-08-24 LAB — BASIC METABOLIC PANEL
Anion gap: 7 (ref 5–15)
BUN: 7 mg/dL (ref 6–20)
CO2: 30 mmol/L (ref 22–32)
Calcium: 8.6 mg/dL — ABNORMAL LOW (ref 8.9–10.3)
Chloride: 100 mmol/L — ABNORMAL LOW (ref 101–111)
Creatinine, Ser: 1 mg/dL (ref 0.61–1.24)
GFR calc Af Amer: >60 mL/min (ref >60)
GFR calc non Af Amer: >60 mL/min (ref >60)
Glucose, Bld: 85 mg/dL (ref 65–99)
Potassium: 3.6 mmol/L (ref 3.5–5.1)
Sodium: 137 mmol/L (ref 135–145)

## 2016-08-24 LAB — ECHOCARDIOGRAM COMPLETE
E decel time: 155 msec
E/e' ratio: 11.65
FS: 28 % (ref 28–44)
IVS/LV PW RATIO, ED: 1.01
LA ID, A-P, ES: 43 mm
LA diam end sys: 43 mm
LA diam index: 1.84 cm/m2
LA vol A4C: 38.9 ml
LA vol index: 18.8 mL/m2
LA vol: 44 mL
LV E/e' medial: 11.65
LV E/e'average: 11.65
LV PW d: 12.9 mm — AB (ref 0.6–1.1)
LV dias vol index: 34 mL/m2
LV dias vol: 80 mL (ref 62–150)
LV e' LATERAL: 6.31 cm/s
LV sys vol index: 16 mL/m2
LV sys vol: 38 mL
LVOT SV: 46 mL
LVOT VTI: 13.4 cm
LVOT area: 3.46 cm2
LVOT diameter: 21 mm
LVOT peak grad rest: 2 mmHg
LVOT peak vel: 66.7 cm/s
Lateral S' vel: 9.9 cm/s
MV Dec: 155
MV Peak grad: 2 mmHg
MV pk A vel: 51.1 m/s
MV pk E vel: 73.5 m/s
RV sys press: 22 mmHg
Reg peak vel: 217 cm/s
Simpson's disk: 53
Stroke v: 42 ml
TAPSE: 16.9 mm
TDI e' lateral: 6.31
TDI e' medial: 5.11
TR max vel: 217 cm/s

## 2016-08-24 NOTE — Progress Notes (Signed)
*  PRELIMINARY RESULTS* Echocardiogram 2D Echocardiogram has been performed.  Samuel Germany 08/24/2016, 9:29 AM

## 2016-08-26 DIAGNOSIS — M545 Low back pain: Secondary | ICD-10-CM | POA: Diagnosis not present

## 2016-08-27 ENCOUNTER — Other Ambulatory Visit: Payer: Self-pay | Admitting: Specialist

## 2016-08-27 DIAGNOSIS — M545 Low back pain, unspecified: Secondary | ICD-10-CM

## 2016-08-30 ENCOUNTER — Ambulatory Visit (INDEPENDENT_AMBULATORY_CARE_PROVIDER_SITE_OTHER): Payer: Medicare Other | Admitting: Otolaryngology

## 2016-08-30 DIAGNOSIS — R040 Epistaxis: Secondary | ICD-10-CM | POA: Diagnosis not present

## 2016-08-31 ENCOUNTER — Telehealth: Payer: Self-pay

## 2016-08-31 DIAGNOSIS — R0602 Shortness of breath: Secondary | ICD-10-CM

## 2016-08-31 NOTE — Telephone Encounter (Signed)
Results given to patient, lexiscan ordered,went over instructions, opt states he has had one before, will hold am Insulin

## 2016-08-31 NOTE — Telephone Encounter (Signed)
-----   Message from Arnoldo Lenis, MD sent at 08/31/2016  1:49 PM EDT ----- Echo overall looks good. Heart is mildly stiff which can cause some SOB, but no major abnormalities. Please order a lexiscan for SOB.   Zandra Abts MD

## 2016-09-01 ENCOUNTER — Ambulatory Visit
Admission: RE | Admit: 2016-09-01 | Discharge: 2016-09-01 | Disposition: A | Discharge status: Home / Self Care | Payer: Medicare Other | Source: Ambulatory Visit | Attending: Specialist | Admitting: Specialist

## 2016-09-01 DIAGNOSIS — M545 Low back pain, unspecified: Secondary | ICD-10-CM

## 2016-09-01 DIAGNOSIS — M48061 Spinal stenosis, lumbar region without neurogenic claudication: Secondary | ICD-10-CM | POA: Diagnosis not present

## 2016-09-02 ENCOUNTER — Ambulatory Visit (INDEPENDENT_AMBULATORY_CARE_PROVIDER_SITE_OTHER): Payer: Medicare Other | Admitting: Otolaryngology

## 2016-09-02 DIAGNOSIS — R040 Epistaxis: Secondary | ICD-10-CM

## 2016-09-03 ENCOUNTER — Encounter (HOSPITAL_BASED_OUTPATIENT_CLINIC_OR_DEPARTMENT_OTHER)
Admission: RE | Admit: 2016-09-03 | Discharge: 2016-09-03 | Disposition: A | Discharge status: Home / Self Care | Payer: Medicare Other | Source: Ambulatory Visit | Attending: Cardiology | Admitting: Cardiology

## 2016-09-03 ENCOUNTER — Encounter (HOSPITAL_COMMUNITY)
Admission: RE | Admit: 2016-09-03 | Discharge: 2016-09-03 | Disposition: A | Discharge status: Home / Self Care | Payer: Medicare Other | Source: Ambulatory Visit | Attending: Cardiology | Admitting: Cardiology

## 2016-09-03 ENCOUNTER — Encounter (HOSPITAL_COMMUNITY): Payer: Self-pay

## 2016-09-03 DIAGNOSIS — R0602 Shortness of breath: Secondary | ICD-10-CM

## 2016-09-03 LAB — NM MYOCAR MULTI W/SPECT W/WALL MOTION / EF
LV dias vol: 138 mL (ref 62–150)
LV sys vol: 68 mL
Peak HR: 88 {beats}/min
RATE: 0.36
Rest HR: 75 {beats}/min
SDS: 2
SRS: 5
SSS: 7
TID: 1.55

## 2016-09-03 MED ORDER — TECHNETIUM TC 99M TETROFOSMIN IV KIT
30.0000 | PACK | Freq: Once | INTRAVENOUS | Status: AC | PRN
  Administered 2016-09-03: 30 via INTRAVENOUS

## 2016-09-03 MED ORDER — SODIUM CHLORIDE 0.9% FLUSH
INTRAVENOUS | Status: AC
  Administered 2016-09-03: 10 mL via INTRAVENOUS
  Filled 2016-09-03: qty 10

## 2016-09-03 MED ORDER — REGADENOSON 0.4 MG/5ML IV SOLN
INTRAVENOUS | Status: AC
  Administered 2016-09-03: 0.4 mg via INTRAVENOUS
  Filled 2016-09-03: qty 5

## 2016-09-03 MED ORDER — TECHNETIUM TC 99M TETROFOSMIN IV KIT
10.0000 | PACK | Freq: Once | INTRAVENOUS | Status: AC | PRN
  Administered 2016-09-03: 10 via INTRAVENOUS

## 2016-09-06 ENCOUNTER — Telehealth: Payer: Self-pay

## 2016-09-06 DIAGNOSIS — Z01818 Encounter for other preprocedural examination: Secondary | ICD-10-CM

## 2016-09-06 NOTE — Telephone Encounter (Signed)
@  [facility] [address] [place] [address] Dept: [phone] Loc: [phone]  Lance [name]  09/06/2016  You are scheduled for a Cardiac Catheterization on Thursday, Sep 09, 2016 with Dr. [name].  1. Please arrive at the [facility] (Main Entrance A) at [facility]: [address] [place], [place] [address] at 5:30 AM (two hours before your procedure to ensure your preparation). Free valet parking service is available.   Special note: Every effort is made to have your procedure done on time. Please understand that emergencies sometimes delay scheduled procedures.  2. Diet: Do not eat or drink anything after midnight prior to your procedure except sips of water to take medications.  3. Labs: You will need to have blood drawn on Wednesday, May 9 at [facility] do not need to be fasting.  4. Medication instructions in preparation for your procedure:  Take half dose of Insulins the night before (Wednesday) and HOLD all Insulins the day of cath (Thursday)  HOLD Lasix the Morning of cath  On the morning of your procedure, take your Aspirin and any morning medicines NOT listed above.  You may use sips of water.  5. Plan for one night stay--bring personal belongings. 6. Bring a current list of your medications and current insurance cards. 7. You MUST have a responsible person to drive you home. 8. Someone MUST be with you the first 24 hours after you arrive home or your discharge will be delayed. 9. Please wear clothes that are easy to get on and off and wear slip-on shoes.  Thank you for allowing [place] to care for you!   -- [facility]

## 2016-09-06 NOTE — Telephone Encounter (Signed)
-----   Message from Arnoldo Lenis, MD sent at 09/06/2016 12:30 PM EDT ----- Stress test is abnormal suggesting possibly new blockages. Please arrange a LHC/RHC for chest pain/SOB/abnormal stress test, any provider  Zandra Abts MD

## 2016-09-07 DIAGNOSIS — E230 Hypopituitarism: Secondary | ICD-10-CM | POA: Diagnosis not present

## 2016-09-08 ENCOUNTER — Ambulatory Visit (HOSPITAL_COMMUNITY)
Admission: RE | Admit: 2016-09-08 | Discharge: 2016-09-08 | Disposition: A | Discharge status: Home / Self Care | Payer: Medicare Other | Source: Ambulatory Visit | Attending: Cardiology | Admitting: Cardiology

## 2016-09-08 ENCOUNTER — Other Ambulatory Visit (HOSPITAL_COMMUNITY)
Admission: RE | Admit: 2016-09-08 | Discharge: 2016-09-08 | Disposition: A | Discharge status: Home / Self Care | Payer: Medicare Other | Source: Ambulatory Visit | Attending: Cardiology | Admitting: Cardiology

## 2016-09-08 DIAGNOSIS — Z01810 Encounter for preprocedural cardiovascular examination: Secondary | ICD-10-CM | POA: Diagnosis not present

## 2016-09-08 DIAGNOSIS — Z01818 Encounter for other preprocedural examination: Secondary | ICD-10-CM | POA: Insufficient documentation

## 2016-09-08 DIAGNOSIS — R079 Chest pain, unspecified: Secondary | ICD-10-CM | POA: Diagnosis not present

## 2016-09-08 DIAGNOSIS — R0602 Shortness of breath: Secondary | ICD-10-CM | POA: Diagnosis not present

## 2016-09-08 LAB — CBC WITH DIFFERENTIAL/PLATELET
Basophils Absolute: 0 10*3/uL (ref 0.0–0.1)
Basophils Relative: 1 %
Eosinophils Absolute: 0.1 10*3/uL (ref 0.0–0.7)
Eosinophils Relative: 1 %
HCT: 45 % (ref 39.0–52.0)
Hemoglobin: 14.9 g/dL (ref 13.0–17.0)
Lymphocytes Relative: 32 %
Lymphs Abs: 2.1 10*3/uL (ref 0.7–4.0)
MCH: 27.7 pg (ref 26.0–34.0)
MCHC: 33.1 g/dL (ref 30.0–36.0)
MCV: 83.8 fL (ref 78.0–100.0)
Monocytes Absolute: 0.4 10*3/uL (ref 0.1–1.0)
Monocytes Relative: 7 %
Neutro Abs: 3.9 10*3/uL (ref 1.7–7.7)
Neutrophils Relative %: 59 %
Platelets: 172 10*3/uL (ref 150–400)
RBC: 5.37 MIL/uL (ref 4.22–5.81)
RDW: 14.8 % (ref 11.5–15.5)
WBC: 6.5 10*3/uL (ref 4.0–10.5)

## 2016-09-08 LAB — BASIC METABOLIC PANEL
Anion gap: 4 — ABNORMAL LOW (ref 5–15)
BUN: 9 mg/dL (ref 6–20)
CO2: 31 mmol/L (ref 22–32)
Calcium: 8.6 mg/dL — ABNORMAL LOW (ref 8.9–10.3)
Chloride: 102 mmol/L (ref 101–111)
Creatinine, Ser: 0.98 mg/dL (ref 0.61–1.24)
GFR calc Af Amer: >60 mL/min (ref >60)
GFR calc non Af Amer: >60 mL/min (ref >60)
Glucose, Bld: 158 mg/dL — ABNORMAL HIGH (ref 65–99)
Potassium: 3.3 mmol/L — ABNORMAL LOW (ref 3.5–5.1)
Sodium: 137 mmol/L (ref 135–145)

## 2016-09-08 LAB — PROTIME-INR
INR: 0.95
Prothrombin Time: 12.7 seconds (ref 11.4–15.2)

## 2016-09-09 ENCOUNTER — Encounter (HOSPITAL_COMMUNITY): Payer: Self-pay | Admitting: Interventional Cardiology

## 2016-09-09 ENCOUNTER — Telehealth: Payer: Self-pay | Admitting: Cardiology

## 2016-09-09 ENCOUNTER — Encounter (HOSPITAL_COMMUNITY)
Admission: RE | Disposition: A | Discharge status: Home / Self Care | Payer: Self-pay | Source: Ambulatory Visit | Attending: Interventional Cardiology

## 2016-09-09 ENCOUNTER — Other Ambulatory Visit: Payer: Self-pay | Admitting: Cardiology

## 2016-09-09 ENCOUNTER — Ambulatory Visit (HOSPITAL_COMMUNITY)
Admission: RE | Admit: 2016-09-09 | Discharge: 2016-09-09 | Disposition: A | Discharge status: Home / Self Care | Payer: Medicare Other | Source: Ambulatory Visit | Attending: Interventional Cardiology | Admitting: Interventional Cardiology

## 2016-09-09 DIAGNOSIS — M1990 Unspecified osteoarthritis, unspecified site: Secondary | ICD-10-CM | POA: Diagnosis not present

## 2016-09-09 DIAGNOSIS — Z7951 Long term (current) use of inhaled steroids: Secondary | ICD-10-CM | POA: Insufficient documentation

## 2016-09-09 DIAGNOSIS — N400 Enlarged prostate without lower urinary tract symptoms: Secondary | ICD-10-CM | POA: Insufficient documentation

## 2016-09-09 DIAGNOSIS — I4891 Unspecified atrial fibrillation: Secondary | ICD-10-CM | POA: Insufficient documentation

## 2016-09-09 DIAGNOSIS — Z91040 Latex allergy status: Secondary | ICD-10-CM | POA: Diagnosis not present

## 2016-09-09 DIAGNOSIS — I2582 Chronic total occlusion of coronary artery: Secondary | ICD-10-CM | POA: Diagnosis not present

## 2016-09-09 DIAGNOSIS — Z95810 Presence of automatic (implantable) cardiac defibrillator: Secondary | ICD-10-CM | POA: Diagnosis not present

## 2016-09-09 DIAGNOSIS — I5042 Chronic combined systolic (congestive) and diastolic (congestive) heart failure: Secondary | ICD-10-CM | POA: Diagnosis not present

## 2016-09-09 DIAGNOSIS — Z794 Long term (current) use of insulin: Secondary | ICD-10-CM | POA: Diagnosis not present

## 2016-09-09 DIAGNOSIS — I252 Old myocardial infarction: Secondary | ICD-10-CM | POA: Insufficient documentation

## 2016-09-09 DIAGNOSIS — Z880 Allergy status to penicillin: Secondary | ICD-10-CM | POA: Insufficient documentation

## 2016-09-09 DIAGNOSIS — Z7982 Long term (current) use of aspirin: Secondary | ICD-10-CM | POA: Insufficient documentation

## 2016-09-09 DIAGNOSIS — J449 Chronic obstructive pulmonary disease, unspecified: Secondary | ICD-10-CM | POA: Insufficient documentation

## 2016-09-09 DIAGNOSIS — E785 Hyperlipidemia, unspecified: Secondary | ICD-10-CM | POA: Insufficient documentation

## 2016-09-09 DIAGNOSIS — Z981 Arthrodesis status: Secondary | ICD-10-CM | POA: Insufficient documentation

## 2016-09-09 DIAGNOSIS — K219 Gastro-esophageal reflux disease without esophagitis: Secondary | ICD-10-CM | POA: Insufficient documentation

## 2016-09-09 DIAGNOSIS — I110 Hypertensive heart disease with heart failure: Secondary | ICD-10-CM | POA: Insufficient documentation

## 2016-09-09 DIAGNOSIS — F419 Anxiety disorder, unspecified: Secondary | ICD-10-CM | POA: Diagnosis not present

## 2016-09-09 DIAGNOSIS — F329 Major depressive disorder, single episode, unspecified: Secondary | ICD-10-CM | POA: Insufficient documentation

## 2016-09-09 DIAGNOSIS — I25118 Atherosclerotic heart disease of native coronary artery with other forms of angina pectoris: Secondary | ICD-10-CM | POA: Diagnosis not present

## 2016-09-09 DIAGNOSIS — E1142 Type 2 diabetes mellitus with diabetic polyneuropathy: Secondary | ICD-10-CM | POA: Insufficient documentation

## 2016-09-09 DIAGNOSIS — I25810 Atherosclerosis of coronary artery bypass graft(s) without angina pectoris: Secondary | ICD-10-CM | POA: Insufficient documentation

## 2016-09-09 DIAGNOSIS — Z8249 Family history of ischemic heart disease and other diseases of the circulatory system: Secondary | ICD-10-CM | POA: Insufficient documentation

## 2016-09-09 DIAGNOSIS — Z8673 Personal history of transient ischemic attack (TIA), and cerebral infarction without residual deficits: Secondary | ICD-10-CM | POA: Insufficient documentation

## 2016-09-09 DIAGNOSIS — Z885 Allergy status to narcotic agent status: Secondary | ICD-10-CM | POA: Insufficient documentation

## 2016-09-09 DIAGNOSIS — Z7902 Long term (current) use of antithrombotics/antiplatelets: Secondary | ICD-10-CM | POA: Insufficient documentation

## 2016-09-09 DIAGNOSIS — I2510 Atherosclerotic heart disease of native coronary artery without angina pectoris: Secondary | ICD-10-CM

## 2016-09-09 DIAGNOSIS — Z720 Tobacco use: Secondary | ICD-10-CM | POA: Insufficient documentation

## 2016-09-09 HISTORY — PX: RIGHT/LEFT HEART CATH AND CORONARY ANGIOGRAPHY: CATH118266

## 2016-09-09 LAB — POCT I-STAT 3, VENOUS BLOOD GAS (G3P V)
Acid-Base Excess: 1 mmol/L (ref 0.0–2.0)
Bicarbonate: 28.6 mmol/L — ABNORMAL HIGH (ref 20.0–28.0)
O2 Saturation: 69 %
TCO2: 30 mmol/L (ref 0–100)
pCO2, Ven: 54.9 mmHg (ref 44.0–60.0)
pH, Ven: 7.325 (ref 7.250–7.430)
pO2, Ven: 39 mmHg (ref 32.0–45.0)

## 2016-09-09 LAB — POCT I-STAT 3, ART BLOOD GAS (G3+)
Acid-Base Excess: 1 mmol/L (ref 0.0–2.0)
Bicarbonate: 25.9 mmol/L (ref 20.0–28.0)
O2 Saturation: 97 %
TCO2: 27 mmol/L (ref 0–100)
pCO2 arterial: 43.4 mmHg (ref 32.0–48.0)
pH, Arterial: 7.384 (ref 7.350–7.450)
pO2, Arterial: 90 mmHg (ref 83.0–108.0)

## 2016-09-09 LAB — GLUCOSE, CAPILLARY
Glucose-Capillary: 106 mg/dL — ABNORMAL HIGH (ref 65–99)
Glucose-Capillary: 91 mg/dL (ref 65–99)

## 2016-09-09 LAB — POTASSIUM: Potassium: 3.6 mmol/L (ref 3.5–5.1)

## 2016-09-09 SURGERY — RIGHT/LEFT HEART CATH AND CORONARY ANGIOGRAPHY
Anesthesia: LOCAL

## 2016-09-09 MED ORDER — SODIUM CHLORIDE 0.9 % IV SOLN: 250.0000 mL | INTRAVENOUS | Status: DC | PRN

## 2016-09-09 MED ORDER — HYDRALAZINE HCL 20 MG/ML IJ SOLN
INTRAMUSCULAR | Status: AC
  Filled 2016-09-09: qty 1

## 2016-09-09 MED ORDER — ACETAMINOPHEN 325 MG PO TABS
650.0000 mg | ORAL_TABLET | Freq: Four times a day (QID) | ORAL | Status: DC | PRN
  Administered 2016-09-09: 650 mg via ORAL

## 2016-09-09 MED ORDER — LIDOCAINE HCL (PF) 1 % IJ SOLN
INTRAMUSCULAR | Status: DC | PRN
  Administered 2016-09-09: 30 mL via SUBCUTANEOUS

## 2016-09-09 MED ORDER — SODIUM CHLORIDE 0.9% FLUSH: 3.0000 mL | INTRAVENOUS | Status: DC | PRN

## 2016-09-09 MED ORDER — ISOSORBIDE MONONITRATE ER 30 MG PO TB24: 30.0000 mg | ORAL_TABLET | Freq: Every day | ORAL | 6 refills | Status: DC

## 2016-09-09 MED ORDER — IOPAMIDOL (ISOVUE-370) INJECTION 76%
INTRAVENOUS | Status: DC | PRN
  Administered 2016-09-09: 85 mL via INTRA_ARTERIAL

## 2016-09-09 MED ORDER — HEPARIN (PORCINE) IN NACL 2-0.9 UNIT/ML-% IJ SOLN
INTRAMUSCULAR | Status: AC
  Filled 2016-09-09: qty 1000

## 2016-09-09 MED ORDER — HEPARIN SODIUM (PORCINE) 1000 UNIT/ML IJ SOLN
INTRAMUSCULAR | Status: AC
  Filled 2016-09-09: qty 1

## 2016-09-09 MED ORDER — SODIUM CHLORIDE 0.9% FLUSH: 3.0000 mL | Freq: Two times a day (BID) | INTRAVENOUS | Status: DC

## 2016-09-09 MED ORDER — SODIUM CHLORIDE 0.9 % IV SOLN: INTRAVENOUS | Status: AC

## 2016-09-09 MED ORDER — ASPIRIN 81 MG PO CHEW
81.0000 mg | CHEWABLE_TABLET | ORAL | Status: AC
  Administered 2016-09-09: 81 mg via ORAL

## 2016-09-09 MED ORDER — HYDRALAZINE HCL 20 MG/ML IJ SOLN
INTRAMUSCULAR | Status: DC | PRN
  Administered 2016-09-09: 20 mg via INTRAVENOUS

## 2016-09-09 MED ORDER — MIDAZOLAM HCL 2 MG/2ML IJ SOLN
INTRAMUSCULAR | Status: DC | PRN
  Administered 2016-09-09 (×2): 1 mg via INTRAVENOUS

## 2016-09-09 MED ORDER — SODIUM CHLORIDE 0.9 % WEIGHT BASED INFUSION
1.0000 mL/kg/h | INTRAVENOUS | Status: DC
  Administered 2016-09-09: 1 mL/kg/h via INTRAVENOUS

## 2016-09-09 MED ORDER — MIDAZOLAM HCL 2 MG/2ML IJ SOLN
INTRAMUSCULAR | Status: AC
  Filled 2016-09-09: qty 2

## 2016-09-09 MED ORDER — HEPARIN (PORCINE) IN NACL 2-0.9 UNIT/ML-% IJ SOLN
INTRAMUSCULAR | Status: AC | PRN
  Administered 2016-09-09: 1000 mL

## 2016-09-09 MED ORDER — ACETAMINOPHEN 325 MG PO TABS
ORAL_TABLET | ORAL | Status: AC
  Filled 2016-09-09: qty 2

## 2016-09-09 MED ORDER — FENTANYL CITRATE (PF) 100 MCG/2ML IJ SOLN
INTRAMUSCULAR | Status: DC | PRN
  Administered 2016-09-09 (×2): 25 ug via INTRAVENOUS

## 2016-09-09 MED ORDER — IOPAMIDOL (ISOVUE-370) INJECTION 76%
INTRAVENOUS | Status: AC
  Filled 2016-09-09: qty 100

## 2016-09-09 MED ORDER — FENTANYL CITRATE (PF) 100 MCG/2ML IJ SOLN
INTRAMUSCULAR | Status: AC
  Filled 2016-09-09: qty 2

## 2016-09-09 MED ORDER — SODIUM CHLORIDE 0.9 % WEIGHT BASED INFUSION: 3.0000 mL/kg/h | INTRAVENOUS | Status: AC

## 2016-09-09 MED ORDER — ASPIRIN 81 MG PO CHEW
CHEWABLE_TABLET | ORAL | Status: AC
  Administered 2016-09-09: 81 mg via ORAL
  Filled 2016-09-09: qty 1

## 2016-09-09 SURGICAL SUPPLY — 13 items
CATH INFINITI 5FR MULTPACK ANG (CATHETERS) ×1 IMPLANT
CATH SWAN GANZ 7F STRAIGHT (CATHETERS) ×1 IMPLANT
GLIDESHEATH SLEND SS 6F .021 (SHEATH) ×1 IMPLANT
KIT HEART LEFT (KITS) ×2 IMPLANT
PACK CARDIAC CATHETERIZATION (CUSTOM PROCEDURE TRAY) ×2 IMPLANT
SHEATH GLIDE SLENDER 4/5FR (SHEATH) ×1 IMPLANT
SHEATH PINNACLE 5F 10CM (SHEATH) ×1 IMPLANT
SHEATH PINNACLE 7F 10CM (SHEATH) ×1 IMPLANT
SYR MEDRAD MARK V 150ML (SYRINGE) ×2 IMPLANT
TRANSDUCER W/STOPCOCK (MISCELLANEOUS) ×3 IMPLANT
TUBING CIL FLEX 10 FLL-RA (TUBING) ×2 IMPLANT
WIRE EMERALD 3MM-J .035X150CM (WIRE) ×1 IMPLANT
WIRE HI TORQ VERSACORE-J 145CM (WIRE) ×1 IMPLANT
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
## 2016-09-09 NOTE — Interval H&P Note (Signed)
Cath Lab Visit (complete for each Cath Lab visit)  Clinical Evaluation Leading to the Procedure:   ACS: No.  Non-ACS:    Anginal Classification: CCS III  Anti-ischemic medical therapy: Minimal Therapy (1 class of medications)  Non-Invasive Test Results: No non-invasive testing performed  Prior CABG: Previous CABG      History and Physical Interval Note:  09/09/2016 7:23 AM  Lance [name]  has presented today for surgery, with the diagnosis of abnormal nuc  The various methods of treatment have been discussed with the patient and family. After consideration of risks, benefits and other options for treatment, the patient has consented to  Procedure(s): Right/Left Heart Cath and Coronary Angiography (N/A) as a surgical intervention .  The patient's history has been reviewed, patient examined, no change in status, stable for surgery.  I have reviewed the patient's chart and labs.  Questions were answered to the patient's satisfaction.     [name]

## 2016-09-09 NOTE — Progress Notes (Addendum)
Site area: RFA/RFV Site Prior to Removal:  Level 0 Pressure Applied For:20 min Manual:  yes  Patient Status During Pull:  stable Post Pull Site:  Level 0 Post Pull Instructions Given:  yes Post Pull Pulses Present: palpable Dressing Applied:tegaderm   Bedrest begins @ 8478 till 1310 Comments: removed by Suella Broad

## 2016-09-09 NOTE — Discharge Instructions (Signed)
Angiogram, Care After °This sheet gives you information about how to care for yourself after your procedure. Your health care provider may also give you more specific instructions. If you have problems or questions, contact your health care provider. °What can I expect after the procedure? °After the procedure, it is common to have bruising and tenderness at the catheter insertion area. °Follow these instructions at home: °Insertion site care  °· Follow instructions from your health care provider about how to take care of your insertion site. Make sure you: °¨ Wash your hands with soap and water before you change your bandage (dressing). If soap and water are not available, use hand sanitizer. °¨ Change your dressing as told by your health care provider. °¨ Leave stitches (sutures), skin glue, or adhesive strips in place. These skin closures may need to stay in place for 2 weeks or longer. If adhesive strip edges start to loosen and curl up, you may trim the loose edges. Do not remove adhesive strips completely unless your health care provider tells you to do that. °· Do not take baths, swim, or use a hot tub until your health care provider approves. °· You may shower 24-48 hours after the procedure or as told by your health care provider. °¨ Gently wash the site with plain soap and water. °¨ Pat the area dry with a clean towel. °¨ Do not rub the site. This may cause bleeding. °· Do not apply powder or lotion to the site. Keep the site clean and dry. °· Check your insertion site every day for signs of infection. Check for: °¨ Redness, swelling, or pain. °¨ Fluid or blood. °¨ Warmth. °¨ Pus or a bad smell. °Activity  °· Rest as told by your health care provider, usually for 1-2 days. °· Do not lift anything that is heavier than 10 lbs. (4.5 kg) or as told by your health care provider. °· Do not drive for 24 hours if you were given a medicine to help you relax (sedative). °· Do not drive or use heavy machinery while  taking prescription pain medicine. °General instructions  °· Return to your normal activities as told by your health care provider, usually in about a week. Ask your health care provider what activities are safe for you. °· If the catheter site starts bleeding, lie flat and put pressure on the site. If the bleeding does not stop, get help right away. This is a medical emergency. °· Drink enough fluid to keep your urine clear or pale yellow. This helps flush the contrast dye from your body. °· Take over-the-counter and prescription medicines only as told by your health care provider. °· Keep all follow-up visits as told by your health care provider. This is important. °Contact a health care provider if: °· You have a fever or chills. °· You have redness, swelling, or pain around your insertion site. °· You have fluid or blood coming from your insertion site. °· The insertion site feels warm to the touch. °· You have pus or a bad smell coming from your insertion site. °· You have bruising around the insertion site. °· You notice blood collecting in the tissue around the catheter site (hematoma). The hematoma may be painful to the touch. °Get help right away if: °· You have severe pain at the catheter insertion area. °· The catheter insertion area swells very fast. °· The catheter insertion area is bleeding, and the bleeding does not stop when you hold steady pressure on   the area. °· The area near or just beyond the catheter insertion site becomes pale, cool, tingly, or numb. °These symptoms may represent a serious problem that is an emergency. Do not wait to see if the symptoms will go away. Get medical help right away. Call your local emergency services (911 in the [place] Do not drive yourself to the hospital. °Summary °· After the procedure, it is common to have bruising and tenderness at the catheter insertion area. °· After the procedure, it is important to rest and drink plenty of fluids. °· Do not take baths,  swim, or use a hot tub until your health care provider says it is okay to do so. You may shower 24-48 hours after the procedure or as told by your health care provider. °· If the catheter site starts bleeding, lie flat and put pressure on the site. If the bleeding does not stop, get help right away. This is a medical emergency. °This information is not intended to replace advice given to you by your health care provider. Make sure you discuss any questions you have with your health care provider. °Document Released: 11/05/2004 Document Revised: 03/24/2016 Document Reviewed: 03/24/2016 °Elsevier Interactive Patient Education © 2017 [employer] ° °

## 2016-09-09 NOTE — Telephone Encounter (Signed)
Called patient about adding Imdur 30mg  daily. Instructed not to take his home sildenafil with this medications. Also instructed to be here on 5/16 at 930am for cath.   Lance [name]

## 2016-09-10 ENCOUNTER — Encounter: Payer: Self-pay | Admitting: Family Medicine

## 2016-09-10 ENCOUNTER — Ambulatory Visit (INDEPENDENT_AMBULATORY_CARE_PROVIDER_SITE_OTHER): Payer: Medicare Other | Admitting: Family Medicine

## 2016-09-10 VITALS — BP 144/94 | Ht 69.0 in | Wt 232.2 lb

## 2016-09-10 DIAGNOSIS — I10 Essential (primary) hypertension: Secondary | ICD-10-CM | POA: Diagnosis not present

## 2016-09-10 DIAGNOSIS — I255 Ischemic cardiomyopathy: Secondary | ICD-10-CM | POA: Diagnosis not present

## 2016-09-10 MED ORDER — AMLODIPINE BESYLATE 2.5 MG PO TABS: 2.5000 mg | ORAL_TABLET | Freq: Every day | ORAL | 3 refills | Status: DC

## 2016-09-10 NOTE — Progress Notes (Signed)
   Subjective:    Patient ID: Lance [name], male    DOB: 04/12/1946, 71 y.o.   MRN: [id number]  Hypertension  The current episode started yesterday. Associated symptoms include headaches. (Dizziness )  The patient states that he's had several times over the past 24 hours where he felt throbbing in his head his blood pressure was up and he felt dizzy he denied nausea vomiting denies blurred vision patient has coronary artery blockages for which they're doing another procedure next week he relates compliance with his medicine and states his blood pressure goes up a lot when he does any type of walking    Review of Systems  Neurological: Positive for headaches.   Denies chest pain shortness of breath except when he walks he gets a little short of breath denies fevers chills cough    Objective:   Physical Exam  Lungs are clear hearts regular pulse normal blood pressure borderline 134/90 sitting. I had the patient walked approximately 130 feet and his blood pressure was 160/92      Assessment & Plan:  HTN-hypertensive response to exercise-coronary artery blockage-add amlodipine 2.5 mg 1 daily ideally get blood pressure to be more like 130/80 if any problems follow-up, follow-up with cardiology this coming week

## 2016-09-13 ENCOUNTER — Other Ambulatory Visit: Payer: Self-pay | Admitting: Family Medicine

## 2016-09-15 ENCOUNTER — Encounter (HOSPITAL_COMMUNITY): Payer: Self-pay | Admitting: General Practice

## 2016-09-15 ENCOUNTER — Encounter (HOSPITAL_COMMUNITY)
Admission: RE | Disposition: A | Discharge status: Home / Self Care | Payer: Self-pay | Source: Ambulatory Visit | Attending: Interventional Cardiology

## 2016-09-15 ENCOUNTER — Ambulatory Visit (HOSPITAL_COMMUNITY)
Admission: RE | Admit: 2016-09-15 | Discharge: 2016-09-16 | Disposition: A | Discharge status: Home / Self Care | Payer: Medicare Other | Source: Ambulatory Visit | Attending: Interventional Cardiology | Admitting: Interventional Cardiology

## 2016-09-15 DIAGNOSIS — Z720 Tobacco use: Secondary | ICD-10-CM | POA: Insufficient documentation

## 2016-09-15 DIAGNOSIS — I252 Old myocardial infarction: Secondary | ICD-10-CM | POA: Diagnosis not present

## 2016-09-15 DIAGNOSIS — I2584 Coronary atherosclerosis due to calcified coronary lesion: Secondary | ICD-10-CM | POA: Diagnosis not present

## 2016-09-15 DIAGNOSIS — Z7902 Long term (current) use of antithrombotics/antiplatelets: Secondary | ICD-10-CM | POA: Insufficient documentation

## 2016-09-15 DIAGNOSIS — Z8673 Personal history of transient ischemic attack (TIA), and cerebral infarction without residual deficits: Secondary | ICD-10-CM | POA: Diagnosis not present

## 2016-09-15 DIAGNOSIS — I509 Heart failure, unspecified: Secondary | ICD-10-CM | POA: Diagnosis not present

## 2016-09-15 DIAGNOSIS — Z981 Arthrodesis status: Secondary | ICD-10-CM | POA: Diagnosis not present

## 2016-09-15 DIAGNOSIS — Z7982 Long term (current) use of aspirin: Secondary | ICD-10-CM | POA: Diagnosis not present

## 2016-09-15 DIAGNOSIS — I110 Hypertensive heart disease with heart failure: Secondary | ICD-10-CM | POA: Insufficient documentation

## 2016-09-15 DIAGNOSIS — Z7951 Long term (current) use of inhaled steroids: Secondary | ICD-10-CM | POA: Insufficient documentation

## 2016-09-15 DIAGNOSIS — I25118 Atherosclerotic heart disease of native coronary artery with other forms of angina pectoris: Secondary | ICD-10-CM

## 2016-09-15 DIAGNOSIS — K219 Gastro-esophageal reflux disease without esophagitis: Secondary | ICD-10-CM | POA: Diagnosis not present

## 2016-09-15 DIAGNOSIS — I4891 Unspecified atrial fibrillation: Secondary | ICD-10-CM | POA: Diagnosis not present

## 2016-09-15 DIAGNOSIS — Z91040 Latex allergy status: Secondary | ICD-10-CM | POA: Insufficient documentation

## 2016-09-15 DIAGNOSIS — Z8249 Family history of ischemic heart disease and other diseases of the circulatory system: Secondary | ICD-10-CM | POA: Insufficient documentation

## 2016-09-15 DIAGNOSIS — Z794 Long term (current) use of insulin: Secondary | ICD-10-CM | POA: Insufficient documentation

## 2016-09-15 DIAGNOSIS — Z951 Presence of aortocoronary bypass graft: Secondary | ICD-10-CM | POA: Diagnosis not present

## 2016-09-15 DIAGNOSIS — E1142 Type 2 diabetes mellitus with diabetic polyneuropathy: Secondary | ICD-10-CM | POA: Insufficient documentation

## 2016-09-15 DIAGNOSIS — Z885 Allergy status to narcotic agent status: Secondary | ICD-10-CM | POA: Diagnosis not present

## 2016-09-15 DIAGNOSIS — R51 Headache: Secondary | ICD-10-CM | POA: Insufficient documentation

## 2016-09-15 DIAGNOSIS — Z95810 Presence of automatic (implantable) cardiac defibrillator: Secondary | ICD-10-CM | POA: Insufficient documentation

## 2016-09-15 DIAGNOSIS — I2510 Atherosclerotic heart disease of native coronary artery without angina pectoris: Secondary | ICD-10-CM | POA: Diagnosis present

## 2016-09-15 DIAGNOSIS — F419 Anxiety disorder, unspecified: Secondary | ICD-10-CM | POA: Diagnosis not present

## 2016-09-15 DIAGNOSIS — N400 Enlarged prostate without lower urinary tract symptoms: Secondary | ICD-10-CM | POA: Diagnosis not present

## 2016-09-15 DIAGNOSIS — J449 Chronic obstructive pulmonary disease, unspecified: Secondary | ICD-10-CM | POA: Diagnosis not present

## 2016-09-15 DIAGNOSIS — F329 Major depressive disorder, single episode, unspecified: Secondary | ICD-10-CM | POA: Diagnosis not present

## 2016-09-15 DIAGNOSIS — Z955 Presence of coronary angioplasty implant and graft: Secondary | ICD-10-CM

## 2016-09-15 DIAGNOSIS — I25119 Atherosclerotic heart disease of native coronary artery with unspecified angina pectoris: Secondary | ICD-10-CM | POA: Diagnosis not present

## 2016-09-15 DIAGNOSIS — E785 Hyperlipidemia, unspecified: Secondary | ICD-10-CM | POA: Diagnosis not present

## 2016-09-15 DIAGNOSIS — Z880 Allergy status to penicillin: Secondary | ICD-10-CM | POA: Insufficient documentation

## 2016-09-15 HISTORY — DX: Presence of automatic (implantable) cardiac defibrillator: Z95.810

## 2016-09-15 HISTORY — DX: Anxiety disorder, unspecified: F41.9

## 2016-09-15 HISTORY — DX: Headache, unspecified: R51.9

## 2016-09-15 HISTORY — DX: Other chronic pain: G89.29

## 2016-09-15 HISTORY — PX: CORONARY STENT INTERVENTION: CATH118234

## 2016-09-15 HISTORY — DX: Unspecified osteoarthritis, unspecified site: M19.90

## 2016-09-15 HISTORY — DX: Major depressive disorder, single episode, unspecified: F32.9

## 2016-09-15 HISTORY — DX: Type 2 diabetes mellitus without complications: E11.9

## 2016-09-15 HISTORY — DX: Depression, unspecified: F32.A

## 2016-09-15 HISTORY — DX: Low back pain: M54.5

## 2016-09-15 HISTORY — DX: Headache: R51

## 2016-09-15 HISTORY — DX: Low back pain, unspecified: M54.50

## 2016-09-15 HISTORY — PX: CORONARY ATHERECTOMY: CATH118238

## 2016-09-15 LAB — CBC
HCT: 42.3 % (ref 39.0–52.0)
Hemoglobin: 13.8 g/dL (ref 13.0–17.0)
MCH: 27 pg (ref 26.0–34.0)
MCHC: 32.6 g/dL (ref 30.0–36.0)
MCV: 82.8 fL (ref 78.0–100.0)
Platelets: 157 10*3/uL (ref 150–400)
RBC: 5.11 MIL/uL (ref 4.22–5.81)
RDW: 15.3 % (ref 11.5–15.5)
WBC: 6.9 10*3/uL (ref 4.0–10.5)

## 2016-09-15 LAB — BASIC METABOLIC PANEL
Anion gap: 6 (ref 5–15)
BUN: 8 mg/dL (ref 6–20)
CO2: 27 mmol/L (ref 22–32)
Calcium: 8.7 mg/dL — ABNORMAL LOW (ref 8.9–10.3)
Chloride: 105 mmol/L (ref 101–111)
Creatinine, Ser: 0.99 mg/dL (ref 0.61–1.24)
GFR calc Af Amer: >60 mL/min (ref >60)
GFR calc non Af Amer: >60 mL/min (ref >60)
Glucose, Bld: 96 mg/dL (ref 65–99)
Potassium: 4 mmol/L (ref 3.5–5.1)
Sodium: 138 mmol/L (ref 135–145)

## 2016-09-15 LAB — POCT ACTIVATED CLOTTING TIME: Activated Clotting Time: 158 seconds

## 2016-09-15 LAB — GLUCOSE, CAPILLARY
Glucose-Capillary: 98 mg/dL (ref 65–99)
Glucose-Capillary: 94 mg/dL (ref 65–99)
Glucose-Capillary: 85 mg/dL (ref 65–99)
Glucose-Capillary: 177 mg/dL — ABNORMAL HIGH (ref 65–99)

## 2016-09-15 SURGERY — CORONARY ATHERECTOMY
Anesthesia: LOCAL

## 2016-09-15 MED ORDER — CLOPIDOGREL BISULFATE 75 MG PO TABS
75.0000 mg | ORAL_TABLET | Freq: Every day | ORAL | Status: DC
  Administered 2016-09-16: 75 mg via ORAL
  Filled 2016-09-15: qty 1

## 2016-09-15 MED ORDER — ONDANSETRON HCL 4 MG/2ML IJ SOLN: 4.0000 mg | Freq: Four times a day (QID) | INTRAMUSCULAR | Status: DC | PRN

## 2016-09-15 MED ORDER — METOPROLOL TARTRATE 25 MG PO TABS
25.0000 mg | ORAL_TABLET | Freq: Two times a day (BID) | ORAL | Status: DC
  Administered 2016-09-15 – 2016-09-16 (×2): 25 mg via ORAL
  Filled 2016-09-15 (×2): qty 1

## 2016-09-15 MED ORDER — ASPIRIN 81 MG PO CHEW
CHEWABLE_TABLET | ORAL | Status: AC
  Administered 2016-09-15: 81 mg via ORAL
  Filled 2016-09-15: qty 1

## 2016-09-15 MED ORDER — ALBUTEROL SULFATE HFA 108 (90 BASE) MCG/ACT IN AERS: 2.0000 | INHALATION_SPRAY | Freq: Four times a day (QID) | RESPIRATORY_TRACT | Status: DC | PRN

## 2016-09-15 MED ORDER — ANGIOPLASTY BOOK
Freq: Once | Status: AC
  Administered 2016-09-15: 20:00:00
  Filled 2016-09-15: qty 1

## 2016-09-15 MED ORDER — LORATADINE 10 MG PO TABS
10.0000 mg | ORAL_TABLET | Freq: Every day | ORAL | Status: DC
  Administered 2016-09-15 – 2016-09-16 (×2): 10 mg via ORAL
  Filled 2016-09-15 (×2): qty 1

## 2016-09-15 MED ORDER — VERAPAMIL HCL 2.5 MG/ML IV SOLN
INTRAVENOUS | Status: DC | PRN
  Administered 2016-09-15: 10 mL via INTRA_ARTERIAL

## 2016-09-15 MED ORDER — INSULIN DETEMIR 100 UNIT/ML FLEXPEN: 100.0000 [IU] | PEN_INJECTOR | Freq: Every day | SUBCUTANEOUS | Status: DC

## 2016-09-15 MED ORDER — SODIUM CHLORIDE 0.9 % WEIGHT BASED INFUSION
3.0000 mL/kg/h | INTRAVENOUS | Status: DC
  Administered 2016-09-15: 3 mL/kg/h via INTRAVENOUS

## 2016-09-15 MED ORDER — PANTOPRAZOLE SODIUM 40 MG PO TBEC
40.0000 mg | DELAYED_RELEASE_TABLET | Freq: Every day | ORAL | Status: DC
  Administered 2016-09-15 – 2016-09-16 (×3): 40 mg via ORAL
  Filled 2016-09-15 (×2): qty 1

## 2016-09-15 MED ORDER — ALPRAZOLAM 0.5 MG PO TABS: 0.5000 mg | ORAL_TABLET | Freq: Two times a day (BID) | ORAL | Status: DC | PRN

## 2016-09-15 MED ORDER — LIDOCAINE HCL (PF) 1 % IJ SOLN
INTRAMUSCULAR | Status: DC | PRN
  Administered 2016-09-15: 5 mL
  Administered 2016-09-15: 20 mL

## 2016-09-15 MED ORDER — MOMETASONE FURO-FORMOTEROL FUM 200-5 MCG/ACT IN AERO
2.0000 | INHALATION_SPRAY | Freq: Two times a day (BID) | RESPIRATORY_TRACT | Status: DC
  Administered 2016-09-15: 2 via RESPIRATORY_TRACT
  Filled 2016-09-15: qty 8.8

## 2016-09-15 MED ORDER — HEPARIN (PORCINE) IN NACL 2-0.9 UNIT/ML-% IJ SOLN
INTRAMUSCULAR | Status: AC
  Filled 2016-09-15: qty 1000

## 2016-09-15 MED ORDER — FENTANYL CITRATE (PF) 100 MCG/2ML IJ SOLN
INTRAMUSCULAR | Status: AC
  Filled 2016-09-15: qty 2

## 2016-09-15 MED ORDER — CLOPIDOGREL BISULFATE 75 MG PO TABS
ORAL_TABLET | ORAL | Status: AC
  Administered 2016-09-15: 75 mg via ORAL
  Filled 2016-09-15: qty 1

## 2016-09-15 MED ORDER — ASPIRIN 81 MG PO CHEW
81.0000 mg | CHEWABLE_TABLET | ORAL | Status: AC
  Administered 2016-09-15: 81 mg via ORAL

## 2016-09-15 MED ORDER — GABAPENTIN 300 MG PO CAPS: 300.0000 mg | ORAL_CAPSULE | Freq: Two times a day (BID) | ORAL | Status: DC | PRN

## 2016-09-15 MED ORDER — ASPIRIN 81 MG PO CHEW
81.0000 mg | CHEWABLE_TABLET | Freq: Every day | ORAL | Status: DC
  Administered 2016-09-16: 81 mg via ORAL
  Filled 2016-09-15: qty 1

## 2016-09-15 MED ORDER — OLOPATADINE HCL 0.1 % OP SOLN: 1.0000 [drp] | Freq: Two times a day (BID) | OPHTHALMIC | Status: DC | PRN

## 2016-09-15 MED ORDER — HYDRALAZINE HCL 20 MG/ML IJ SOLN
5.0000 mg | INTRAMUSCULAR | Status: AC | PRN
  Administered 2016-09-15: 5 mg via INTRAVENOUS
  Filled 2016-09-15: qty 1

## 2016-09-15 MED ORDER — DONEPEZIL HCL 10 MG PO TABS
10.0000 mg | ORAL_TABLET | Freq: Every day | ORAL | Status: DC
  Administered 2016-09-15: 21:00:00 10 mg via ORAL
  Filled 2016-09-15: qty 1

## 2016-09-15 MED ORDER — CLOPIDOGREL BISULFATE 75 MG PO TABS
75.0000 mg | ORAL_TABLET | ORAL | Status: AC
  Administered 2016-09-15: 75 mg via ORAL

## 2016-09-15 MED ORDER — HYDROCODONE-ACETAMINOPHEN 5-325 MG PO TABS: 1.0000 | ORAL_TABLET | ORAL | Status: DC | PRN

## 2016-09-15 MED ORDER — SODIUM CHLORIDE 0.9% FLUSH: 3.0000 mL | Freq: Two times a day (BID) | INTRAVENOUS | Status: DC

## 2016-09-15 MED ORDER — FAMOTIDINE 20 MG PO TABS
10.0000 mg | ORAL_TABLET | Freq: Every day | ORAL | Status: DC
  Administered 2016-09-15 – 2016-09-16 (×2): 10 mg via ORAL
  Filled 2016-09-15 (×2): qty 1

## 2016-09-15 MED ORDER — LABETALOL HCL 5 MG/ML IV SOLN: 10.0000 mg | INTRAVENOUS | Status: AC | PRN

## 2016-09-15 MED ORDER — SODIUM CHLORIDE 0.9 % IV SOLN
INTRAVENOUS | Status: AC
  Administered 2016-09-15: 15:00:00 via INTRAVENOUS

## 2016-09-15 MED ORDER — INSULIN DETEMIR 100 UNIT/ML ~~LOC~~ SOLN
100.0000 [IU] | Freq: Every day | SUBCUTANEOUS | Status: DC
  Administered 2016-09-15: 100 [IU] via SUBCUTANEOUS
  Filled 2016-09-15: qty 1

## 2016-09-15 MED ORDER — CLOPIDOGREL BISULFATE 300 MG PO TABS
ORAL_TABLET | ORAL | Status: AC
  Filled 2016-09-15: qty 1

## 2016-09-15 MED ORDER — ATORVASTATIN CALCIUM 80 MG PO TABS
80.0000 mg | ORAL_TABLET | Freq: Every day | ORAL | Status: DC
  Administered 2016-09-15: 80 mg via ORAL
  Filled 2016-09-15: qty 1

## 2016-09-15 MED ORDER — SODIUM CHLORIDE 0.9 % IV SOLN: 250.0000 mL | INTRAVENOUS | Status: DC | PRN

## 2016-09-15 MED ORDER — IOPAMIDOL (ISOVUE-370) INJECTION 76%
INTRAVENOUS | Status: AC
  Filled 2016-09-15: qty 50

## 2016-09-15 MED ORDER — IOPAMIDOL (ISOVUE-370) INJECTION 76%
INTRAVENOUS | Status: AC
  Filled 2016-09-15: qty 100

## 2016-09-15 MED ORDER — ACETAMINOPHEN 325 MG PO TABS: 650.0000 mg | ORAL_TABLET | ORAL | Status: DC | PRN

## 2016-09-15 MED ORDER — NITROGLYCERIN 1 MG/10 ML FOR IR/CATH LAB
INTRA_ARTERIAL | Status: AC
  Filled 2016-09-15: qty 10

## 2016-09-15 MED ORDER — HEPARIN SODIUM (PORCINE) 1000 UNIT/ML IJ SOLN
INTRAMUSCULAR | Status: AC
  Filled 2016-09-15: qty 1

## 2016-09-15 MED ORDER — LIDOCAINE HCL 1 % IJ SOLN
INTRAMUSCULAR | Status: AC
  Filled 2016-09-15: qty 20

## 2016-09-15 MED ORDER — IOPAMIDOL (ISOVUE-370) INJECTION 76%
INTRAVENOUS | Status: DC | PRN
  Administered 2016-09-15: 95 mL via INTRA_ARTERIAL

## 2016-09-15 MED ORDER — SODIUM CHLORIDE 0.9% FLUSH: 3.0000 mL | INTRAVENOUS | Status: DC | PRN

## 2016-09-15 MED ORDER — HEPARIN (PORCINE) IN NACL 2-0.9 UNIT/ML-% IJ SOLN
INTRAMUSCULAR | Status: AC | PRN
  Administered 2016-09-15: 1000 mL

## 2016-09-15 MED ORDER — LOSARTAN POTASSIUM 50 MG PO TABS
50.0000 mg | ORAL_TABLET | Freq: Every day | ORAL | Status: DC
  Administered 2016-09-15 – 2016-09-16 (×2): 50 mg via ORAL
  Filled 2016-09-15 (×2): qty 1

## 2016-09-15 MED ORDER — VERAPAMIL HCL 2.5 MG/ML IV SOLN
INTRAVENOUS | Status: AC
  Filled 2016-09-15: qty 2

## 2016-09-15 MED ORDER — ACTIVE PARTNERSHIP FOR HEALTH OF YOUR HEART BOOK
Freq: Once | Status: AC
  Administered 2016-09-15: 20:00:00
  Filled 2016-09-15: qty 1

## 2016-09-15 MED ORDER — DULOXETINE HCL 60 MG PO CPEP
60.0000 mg | ORAL_CAPSULE | Freq: Every day | ORAL | Status: DC
  Administered 2016-09-15 – 2016-09-16 (×2): 60 mg via ORAL
  Filled 2016-09-15 (×2): qty 1

## 2016-09-15 MED ORDER — NITROGLYCERIN 1 MG/10 ML FOR IR/CATH LAB
INTRA_ARTERIAL | Status: DC | PRN
  Administered 2016-09-15: 200 ug via INTRACORONARY

## 2016-09-15 MED ORDER — ALBUTEROL SULFATE (2.5 MG/3ML) 0.083% IN NEBU: 2.5000 mg | INHALATION_SOLUTION | Freq: Four times a day (QID) | RESPIRATORY_TRACT | Status: DC | PRN

## 2016-09-15 MED ORDER — SODIUM CHLORIDE 0.9 % WEIGHT BASED INFUSION: 1.0000 mL/kg/h | INTRAVENOUS | Status: DC

## 2016-09-15 MED ORDER — NITROGLYCERIN 0.4 MG SL SUBL: 0.4000 mg | SUBLINGUAL_TABLET | SUBLINGUAL | Status: DC | PRN

## 2016-09-15 MED ORDER — INSULIN LISPRO 100 UNIT/ML (KWIKPEN): 22.0000 [IU] | PEN_INJECTOR | Freq: Three times a day (TID) | SUBCUTANEOUS | Status: DC

## 2016-09-15 MED ORDER — MIDAZOLAM HCL 2 MG/2ML IJ SOLN
INTRAMUSCULAR | Status: DC | PRN
  Administered 2016-09-15: 2 mg via INTRAVENOUS

## 2016-09-15 MED ORDER — FENTANYL CITRATE (PF) 100 MCG/2ML IJ SOLN
INTRAMUSCULAR | Status: DC | PRN
  Administered 2016-09-15 (×2): 25 ug via INTRAVENOUS

## 2016-09-15 MED ORDER — GUAIFENESIN ER 600 MG PO TB12
1200.0000 mg | ORAL_TABLET | Freq: Two times a day (BID) | ORAL | Status: DC
  Administered 2016-09-15 – 2016-09-16 (×2): 1200 mg via ORAL
  Filled 2016-09-15 (×2): qty 2

## 2016-09-15 MED ORDER — MIDAZOLAM HCL 2 MG/2ML IJ SOLN
INTRAMUSCULAR | Status: AC
  Filled 2016-09-15: qty 2

## 2016-09-15 MED ORDER — HEPARIN SODIUM (PORCINE) 1000 UNIT/ML IJ SOLN
INTRAMUSCULAR | Status: DC | PRN
  Administered 2016-09-15: 9000 [IU] via INTRAVENOUS
  Administered 2016-09-15: 3000 [IU] via INTRAVENOUS

## 2016-09-15 MED ORDER — ASPIRIN EC 81 MG PO TBEC: 81.0000 mg | DELAYED_RELEASE_TABLET | Freq: Every day | ORAL | Status: DC

## 2016-09-15 MED ORDER — AMLODIPINE BESYLATE 5 MG PO TABS
2.5000 mg | ORAL_TABLET | Freq: Every day | ORAL | Status: DC
  Administered 2016-09-15 – 2016-09-16 (×2): 2.5 mg via ORAL
  Filled 2016-09-15 (×2): qty 1

## 2016-09-15 MED ORDER — CLOPIDOGREL BISULFATE 300 MG PO TABS
ORAL_TABLET | ORAL | Status: DC | PRN
  Administered 2016-09-15: 300 mg via ORAL

## 2016-09-15 SURGICAL SUPPLY — 30 items
BALLN EMERGE MR 3.0X15 (BALLOONS) ×2
BALLN MAVERICK OTW 3.0X15 (BALLOONS) ×2
BALLN ~~LOC~~ EUPHORA RX 4.0X15 (BALLOONS) ×2
BALLOON EMERGE MR 3.0X15 (BALLOONS) IMPLANT
BALLOON MAVERICK OTW 3.0X15 (BALLOONS) IMPLANT
BALLOON ~~LOC~~ EUPHORA RX 4.0X15 (BALLOONS) IMPLANT
CATH INFINITI 5FR MPB2 (CATHETERS) ×1 IMPLANT
CATH LAUNCHER 6FR AL1 (CATHETERS) IMPLANT
CATHETER LAUNCHER 6FR AL1 (CATHETERS) ×2
COVER PRB 48X5XTLSCP FOLD TPE (BAG) IMPLANT
COVER PROBE 5X48 (BAG) ×2
CROWN DIAMONDBACK CLASSIC 1.25 (BURR) ×1 IMPLANT
DEVICE RAD COMP TR BAND LRG (VASCULAR PRODUCTS) ×1 IMPLANT
GLIDESHEATH SLEND SS 6F .021 (SHEATH) ×1 IMPLANT
GUIDEWIRE INQWIRE 1.5J.035X260 (WIRE) IMPLANT
INQWIRE 1.5J .035X260CM (WIRE) ×2
KIT ENCORE 26 ADVANTAGE (KITS) ×1 IMPLANT
KIT HEART LEFT (KITS) ×2 IMPLANT
LUBRICANT VIPERSLIDE CORONARY (MISCELLANEOUS) ×1 IMPLANT
PACK CARDIAC CATHETERIZATION (CUSTOM PROCEDURE TRAY) ×2 IMPLANT
SHEATH PINNACLE 6F 10CM (SHEATH) ×1 IMPLANT
STENT SYNERGY DES 3.5X32 (Permanent Stent) ×1 IMPLANT
TRANSDUCER W/STOPCOCK (MISCELLANEOUS) ×2 IMPLANT
TUBING CIL FLEX 10 FLL-RA (TUBING) ×2 IMPLANT
VALVE GUARDIAN II ~~LOC~~ HEMO (MISCELLANEOUS) ×1 IMPLANT
WIRE ASAHI PROWATER 180CM (WIRE) ×1 IMPLANT
WIRE ASAHI PROWATER 300CM (WIRE) ×1 IMPLANT
WIRE EMERALD 3MM-J .035X150CM (WIRE) ×1 IMPLANT
WIRE HI TORQ VERSACORE-J 145CM (WIRE) ×1 IMPLANT
WIRE VIPER ADVANCE COR .012TIP (WIRE) ×1 IMPLANT

## 2016-09-15 NOTE — Care Management Note (Signed)
Case Management Note  Patient Details  Name: Lance [name] MRN: [id number] Date of Birth: 12/28/1945  Subjective/Objective:   S/p  Coronary Stent Intervention, will be on plavix.                 Action/Plan: NCM will follow for dc needs.  Expected Discharge Date:                  Expected Discharge Plan:  Home/Self Care  In-House Referral:     Discharge planning Services  CM Consult  Post Acute Care Choice:    Choice offered to:     DME Arranged:    DME Agency:     HH Arranged:    HH Agency:     Status of Service:  In process, will continue to follow  If discussed at Long Length of Stay Meetings, dates discussed:    Additional Comments:  Zenon Mayo, RN 09/15/2016, 4:12 PM

## 2016-09-15 NOTE — H&P (View-Only) (Signed)
   Subjective:    Patient ID: Lance [name], male    DOB: 11-22-1945, 71 y.o.   MRN: [id number]  Hypertension  The current episode started yesterday. Associated symptoms include headaches. (Dizziness )  The patient states that he's had several times over the past 24 hours where he felt throbbing in his head his blood pressure was up and he felt dizzy he denied nausea vomiting denies blurred vision patient has coronary artery blockages for which they're doing another procedure next week he relates compliance with his medicine and states his blood pressure goes up a lot when he does any type of walking    Review of Systems  Neurological: Positive for headaches.   Denies chest pain shortness of breath except when he walks he gets a little short of breath denies fevers chills cough    Objective:   Physical Exam  Lungs are clear hearts regular pulse normal blood pressure borderline 134/90 sitting. I had the patient walked approximately 130 feet and his blood pressure was 160/92      Assessment & Plan:  HTN-hypertensive response to exercise-coronary artery blockage-add amlodipine 2.5 mg 1 daily ideally get blood pressure to be more like 130/80 if any problems follow-up, follow-up with cardiology this coming week

## 2016-09-15 NOTE — Progress Notes (Signed)
Site area: right groin  Site Prior to Removal:  Level 0  Pressure Applied For 20 MINUTES    Minutes Beginning at 1640  Manual:   Yes.    Patient Status During Pull:  stable  Post Pull Groin Site:  Level 0  Post Pull Instructions Given:  Yes.    Post Pull Pulses Present:  Yes.    Dressing Applied:  Yes.    Comments:

## 2016-09-15 NOTE — Progress Notes (Signed)
TR BAND REMOVAL  LOCATION:  right radial  DEFLATED PER PROTOCOL:  Yes.    TIME BAND OFF / DRESSING APPLIED:   1800   SITE UPON ARRIVAL:   Level 0  SITE AFTER BAND REMOVAL:  Level 0  CIRCULATION SENSATION AND MOVEMENT:  Within Normal Limits  Yes.    COMMENTS:    
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
## 2016-09-15 NOTE — Interval H&P Note (Signed)
History and Physical Interval Note:  09/15/2016 12:24 PM  Lance [name]  has presented today for surgery, with the diagnosis of cad  The various methods of treatment have been discussed with the patient and family. After consideration of risks, benefits and other options for treatment, the patient has consented to  Procedure(s): Coronary Atherectomy (N/A) as a surgical intervention .  The patient's history has been reviewed, patient examined, no change in status, stable for surgery.  I have reviewed the patient's chart and labs.  Questions were answered to the patient's satisfaction.     [name]

## 2016-09-16 ENCOUNTER — Encounter (HOSPITAL_COMMUNITY): Payer: Self-pay | Admitting: Interventional Cardiology

## 2016-09-16 DIAGNOSIS — I4891 Unspecified atrial fibrillation: Secondary | ICD-10-CM | POA: Diagnosis not present

## 2016-09-16 DIAGNOSIS — I110 Hypertensive heart disease with heart failure: Secondary | ICD-10-CM | POA: Diagnosis not present

## 2016-09-16 DIAGNOSIS — I509 Heart failure, unspecified: Secondary | ICD-10-CM | POA: Diagnosis not present

## 2016-09-16 DIAGNOSIS — I25119 Atherosclerotic heart disease of native coronary artery with unspecified angina pectoris: Secondary | ICD-10-CM | POA: Diagnosis not present

## 2016-09-16 DIAGNOSIS — J449 Chronic obstructive pulmonary disease, unspecified: Secondary | ICD-10-CM | POA: Diagnosis not present

## 2016-09-16 DIAGNOSIS — I2584 Coronary atherosclerosis due to calcified coronary lesion: Secondary | ICD-10-CM | POA: Diagnosis not present

## 2016-09-16 LAB — CBC
HCT: 42.7 % (ref 39.0–52.0)
Hemoglobin: 13.7 g/dL (ref 13.0–17.0)
MCH: 26.8 pg (ref 26.0–34.0)
MCHC: 32.1 g/dL (ref 30.0–36.0)
MCV: 83.6 fL (ref 78.0–100.0)
Platelets: 154 10*3/uL (ref 150–400)
RBC: 5.11 MIL/uL (ref 4.22–5.81)
RDW: 15.7 % — ABNORMAL HIGH (ref 11.5–15.5)
WBC: 7.4 10*3/uL (ref 4.0–10.5)

## 2016-09-16 LAB — BASIC METABOLIC PANEL
Anion gap: 8 (ref 5–15)
BUN: 12 mg/dL (ref 6–20)
CO2: 24 mmol/L (ref 22–32)
Calcium: 8.6 mg/dL — ABNORMAL LOW (ref 8.9–10.3)
Chloride: 103 mmol/L (ref 101–111)
Creatinine, Ser: 1.22 mg/dL (ref 0.61–1.24)
GFR calc Af Amer: >60 mL/min (ref >60)
GFR calc non Af Amer: 59 mL/min — ABNORMAL LOW (ref >60)
Glucose, Bld: 266 mg/dL — ABNORMAL HIGH (ref 65–99)
Potassium: 4.2 mmol/L (ref 3.5–5.1)
Sodium: 135 mmol/L (ref 135–145)

## 2016-09-16 LAB — GLUCOSE, CAPILLARY: Glucose-Capillary: 182 mg/dL — ABNORMAL HIGH (ref 65–99)

## 2016-09-16 LAB — POCT ACTIVATED CLOTTING TIME
Activated Clotting Time: 268 seconds
Activated Clotting Time: 301 seconds

## 2016-09-16 MED ORDER — PANTOPRAZOLE SODIUM 40 MG PO TBEC
40.0000 mg | DELAYED_RELEASE_TABLET | Freq: Every day | ORAL | 3 refills | Status: DC
Start: 2016-09-17 — End: 2017-01-14

## 2016-09-16 MED ORDER — AMLODIPINE BESYLATE 2.5 MG PO TABS: 5.0000 mg | ORAL_TABLET | Freq: Every day | ORAL | 3 refills | Status: DC

## 2016-09-16 NOTE — Progress Notes (Signed)
CARDIAC REHAB PHASE I   PRE:  Rate/Rhythm: 77 SR    BP: sitting 168/90    SaO2:   MODE:  Ambulation: 800 ft   POST:  Rate/Rhythm: 85 SR    BP: sitting 163/93     SaO2:   Pt sts he thinks he was able to walk better/more today. Does feel tired after walking, this has been a problem since his CABG per pt. BP elevated. Ed completed with good reception. Does drink regular coke. Sts his DM doctor told him it was better than diet. Pt eager to start walking and get back to CRPII. Will send referral to [facility]. Voiced understanding to Plavix. [place], ACSM 09/16/2016 8:47 AM

## 2016-09-16 NOTE — Discharge Planning (Cosign Needed)
Discharge Summary    Patient ID: Lance [name],  MRN: [id number], DOB/AGE: 69-Oct-1947 69 y.o.  Admit date: 09/15/2016 Discharge date: 09/16/2016  Primary Care Provider: [name] Primary Cardiologist: Branch  Discharge Diagnoses    Active Problems:   CAD (coronary artery disease)   Allergies Allergies  Allergen Reactions  . Morphine And Related Other (See Comments)    hallucinations  . Penicillins Hives    Can take cefzil Has patient had a PCN reaction causing immediate rash, facial/tongue/throat swelling, SOB or lightheadedness with hypotension:unsure Has patient had a PCN reaction causing severe rash involving mucus membranes or skin necrosis:unsure Has patient had a PCN reaction that required hospitalization:unsure Has patient had a PCN reaction occurring within the last 10 years:No If all of the above answers are "NO", then may proceed with Cephalosporin use. Childhood reaction.  Marland Kitchen Percocet [Oxycodone-Acetaminophen] Other (See Comments)    hallucinations  . Latex Rash  . Levaquin [Levofloxacin In D5w] Itching  . Metformin And Related Other (See Comments)    In high doses, causes diarrhea abdominal bloating   . Tape Rash    Diagnostic Studies/Procedures    LHC: 09/15/16  Conclusion     Mid RCA lesion, 90 %stenosed. A STENT SYNERGY DES 3.5X32 drug eluting stent was successfully placed after orbital atherectomy, postdilated to > 4 mm.  Post intervention, there is a 0% residual stenosis.  Right radial loop. Would not use right radial approach in the future.   Continue dual antiplatelet therapy for at least a year.  Synergy stent was used in the event he needs back surgery, antiplatelet therapy can be stopped sooner.   If no issues arise, plan discharge tomorrow.     _____________   History of Present Illness     71 yo male with PMH of CAD s/p CABG 4v 2013, VF arrest s/p ICD, HTN, HL, TIA, and COPD who presented to the office with Dr. Harl Bowie  08/12/16 with ongoing chest pain interfering with daily activities. He was referred for cardiac cath. Underwent LHC with Dr. Irish Lack on 09/09/16 noting a CTO of the RCA. Placed on DAPT with Imdur added and planned to return for CTO.   Hospital Course     He presented back on 09/15/16 and underwent successful CTO of the RCA with Dr. Irish Lack and Dr. Martinique with synergry DES x1 with orbital atherectomy. Plan to continue with DAPT with ASA/Plavix for at least one year. Synergy stent was placed in the event that he needs back surgery and needs to stop antiplatelet therapy sooner. Morning labs were stable with Cr 1.22, and Hgb 13.7. Worked with cardiac rehab without complaints. Was noted to he hypertensive during this admission. Instructed to increase his amlodipine from 2.5mg  daily to 5mg  daily. Asked to keep a log of BPs and bring back to follow up visit.   He was seen by Dr. Ellyn Hack and determined stable for discharge home. Follow up in the office has been arranged. Medications are listed below.   General: Well developed, well nourished, male appearing in no acute distress. Head: Normocephalic, atraumatic.  Neck: Supple without bruits, JVD. Lungs:  Resp regular and unlabored, CTA. Heart: RRR, S1, S2, no S3, S4, or murmur; no rub. Abdomen: Soft, non-tender, non-distended with normoactive bowel sounds. No hepatomegaly. No rebound/guarding. No obvious abdominal masses. Extremities: No clubbing, cyanosis, edema. Distal pedal pulses are 2+ bilaterally. R radial/groin cath site stable without bruising or hematoma Neuro: Alert and oriented X 3. Moves all  extremities spontaneously. Psych: Normal affect.  _____________  Discharge Vitals Blood pressure (!) 159/88, pulse 68, temperature 98.2 F (36.8 C), temperature source Oral, resp. rate 17, height 5\' 9"  (1.753 m), weight 234 lb 2.1 oz (106.2 kg), SpO2 96 %.  Filed Weights   09/15/16 0923 09/16/16 0338  Weight: 232 lb (105.2 kg) 234 lb 2.1 oz (106.2 kg)      Labs & Radiologic Studies    CBC  Recent Labs  09/15/16 0933 09/16/16 0332  WBC 6.9 7.4  HGB 13.8 13.7  HCT 42.3 42.7  MCV 82.8 83.6  PLT 157 329   Basic Metabolic Panel  Recent Labs  09/15/16 0933 09/16/16 0332  NA 138 135  K 4.0 4.2  CL 105 103  CO2 27 24  GLUCOSE 96 266*  BUN 8 12  CREATININE 0.99 1.22  CALCIUM 8.7* 8.6*   Liver Function Tests No results for input(s): AST, ALT, ALKPHOS, BILITOT, PROT, ALBUMIN in the last 72 hours. No results for input(s): LIPASE, AMYLASE in the last 72 hours. Cardiac Enzymes No results for input(s): CKTOTAL, CKMB, CKMBINDEX, TROPONINI in the last 72 hours. BNP Invalid input(s): POCBNP D-Dimer No results for input(s): DDIMER in the last 72 hours. Hemoglobin A1C No results for input(s): HGBA1C in the last 72 hours. Fasting Lipid Panel No results for input(s): CHOL, HDL, LDLCALC, TRIG, CHOLHDL, LDLDIRECT in the last 72 hours. Thyroid Function Tests No results for input(s): TSH, T4TOTAL, T3FREE, THYROIDAB in the last 72 hours.  Invalid input(s): FREET3 _____________  Dg Chest 2 View  Result Date: 09/08/2016 PRE CLINICAL DATA: The examination prior to cardiac catheterization. The patient reports shortness of breath, fatigue, and chest pain. History of previous CABG. EXAM: CHEST  2 VIEW COMPARISON:  Chest x-ray of July 07, 2015 FINDINGS: The lungs are adequately inflated and clear. The heart and pulmonary vascularity are normal. There are post CABG changes. The dual electrode ICD is in stable position. The sternal wires are intact. There is no pleural effusion. The observed bony thorax is unremarkable. IMPRESSION: There is no acute cardiopulmonary abnormality. Electronically Signed   By: David  Martinique M.D.   On: 09/08/2016 09:23   Ct Lumbar Spine Wo Contrast  Result Date: 09/01/2016 CLINICAL DATA:  Low back and bilateral leg pain for 8 months. No known injury. EXAM: CT LUMBAR SPINE WITHOUT CONTRAST TECHNIQUE: Multidetector CT  imaging of the lumbar spine was performed without intravenous contrast administration. Multiplanar CT image reconstructions were also generated. COMPARISON:  CT abdomen and pelvis 10/19/2011. FINDINGS: Segmentation: Standard. Alignment: As seen on the prior CT scan, the patient is status post L4-5 fusion with 2 ray cages in place. 0.4 cm anterolisthesis L4 on L5 is unchanged. There is trace retrolisthesis L3 on L4, also unchanged. Vertebrae: No fracture or worrisome lesion. Paraspinal and other soft tissues: Large right renal cyst is unchanged. Retroaortic left renal vein is again seen. There is aortoiliac atherosclerosis. 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 Disc levels: T12-L1: Minimal disc bulge and endplate spur. The central canal and foramina are open. L1-2: Mild to moderate bilateral facet degenerative disease. No disc bulge or protrusion. The central canal and foramina are open. L2-3: Shallow disc bulge and moderate facet degenerative disease. The central canal is mildly narrowed. The foramina appear open. L3-4: Shallow broad-based disc bulge and ligamentum flavum thickening cause moderate central canal stenosis. Moderate to moderately severe bilateral foraminal narrowing is also identified. L5-S1: Status post laminectomy, facetectomy and fusion. The central canal and foramina  appear open. L5-S1:  Negative. IMPRESSION: Disc bulge and ligamentum flavum thickening at L3-4 cause moderate central canal stenosis and moderate to moderately severe bilateral foraminal narrowing. Status post L4-5 fusion.  The central canal and foramina are open. Mild central canal narrowing L2-3 where there is a shallow disc bulge and moderate facet arthropathy. Electronically Signed   By: Inge Rise M.D.   On: 09/01/2016 16:08   Nm Myocar Multi W/spect W/wall Motion / Ef  Result Date: 09/03/2016  There was no ST segment deviation noted during stress.  Findings consistent with prior myocardial inferolateral  infarction with mild to moderate peri-infarct ischemia. There is mild anterior ischemia. TID is elevated.  This is an intermediate risk study.  The left ventricular ejection fraction is low normal at 50%    Disposition   Pt is being discharged home today in good condition.  Follow-up Plans & Appointments    Follow-up Information    Arnoldo Lenis, MD Follow up on 09/20/2016.   Specialty:  Cardiology Why:  at 10:40am for your follow up appt.  Contact information: [address] [place] [address] [phone]          Discharge Instructions    Amb Referral to Cardiac Rehabilitation    Complete by:  As directed    Diagnosis:   PTCA Coronary Stents     Call MD for:  redness, tenderness, or signs of infection (pain, swelling, redness, odor or green/yellow discharge around incision site)    Complete by:  As directed    Diet - low sodium heart healthy    Complete by:  As directed    Discharge instructions    Complete by:  As directed    Groin Site Care Refer to this sheet in the next few weeks. These instructions provide you with information on caring for yourself after your procedure. Your caregiver may also give you more specific instructions. Your treatment has been planned according to current medical practices, but problems sometimes occur. Call your caregiver if you have any problems or questions after your procedure. HOME CARE INSTRUCTIONS You may shower 24 hours after the procedure. Remove the bandage (dressing) and gently wash the site with plain soap and water. Gently pat the site dry.  Do not apply powder or lotion to the site.  Do not sit in a bathtub, swimming pool, or whirlpool for 5 to 7 days.  No bending, squatting, or lifting anything over 10 pounds (4.5 kg) as directed by your caregiver.  Inspect the site at least twice daily.  Do not drive home if you are discharged the same day of the procedure. Have someone else drive you.  You may drive 24 hours  after the procedure unless otherwise instructed by your caregiver.  What to expect: Any bruising will usually fade within 1 to 2 weeks.  Blood that collects in the tissue (hematoma) may be painful to the touch. It should usually decrease in size and tenderness within 1 to 2 weeks.  SEEK IMMEDIATE MEDICAL CARE IF: You have unusual pain at the groin site or down the affected leg.  You have redness, warmth, swelling, or pain at the groin site.  You have drainage (other than a small amount of blood on the dressing).  You have chills.  You have a fever or persistent symptoms for more than 72 hours.  You have a fever and your symptoms suddenly get worse.  Your leg becomes pale, cool, tingly, or numb.  You have  heavy bleeding from the site. Hold pressure on the site. .   Radial Site Care Refer to this sheet in the next few weeks. These instructions provide you with information on caring for yourself after your procedure. Your caregiver may also give you more specific instructions. Your treatment has been planned according to current medical practices, but problems sometimes occur. Call your caregiver if you have any problems or questions after your procedure. HOME CARE INSTRUCTIONS You may shower the day after the procedure.Remove the bandage (dressing) and gently wash the site with plain soap and water.Gently pat the site dry.  Do not apply powder or lotion to the site.  Do not submerge the affected site in water for 3 to 5 days.  Inspect the site at least twice daily.  Do not flex or bend the affected arm for 24 hours.  No lifting over 5 pounds (2.3 kg) for 5 days after your procedure.  Do not drive home if you are discharged the same day of the procedure. Have someone else drive you.  You may drive 24 hours after the procedure unless otherwise instructed by your caregiver.  What to expect: Any bruising will usually fade within 1 to 2 weeks.  Blood that collects in the tissue (hematoma) may be  painful to the touch. It should usually decrease in size and tenderness within 1 to 2 weeks.  SEEK IMMEDIATE MEDICAL CARE IF: You have unusual pain at the radial site.  You have redness, warmth, swelling, or pain at the radial site.  You have drainage (other than a small amount of blood on the dressing).  You have chills.  You have a fever or persistent symptoms for more than 72 hours.  You have a fever and your symptoms suddenly get worse.  Your arm becomes pale, cool, tingly, or numb.  You have heavy bleeding from the site. Hold pressure on the site.   We increased your amlodipine to 5mg  daily this admission. Please keep a log of your blood pressures and bring back to your follow up appt.   Increase activity slowly    Complete by:  As directed       Discharge Medications   Current Discharge Medication List    START taking these medications   Details  pantoprazole (PROTONIX) 40 MG tablet Take 1 tablet (40 mg total) by mouth daily. Qty: 30 tablet, Refills: 3      CONTINUE these medications which have CHANGED   Details  amLODipine (NORVASC) 2.5 MG tablet Take 2 tablets (5 mg total) by mouth daily. Qty: 30 tablet, Refills: 3      CONTINUE these medications which have NOT CHANGED   Details  albuterol (PROVENTIL HFA;VENTOLIN HFA) 108 (90 BASE) MCG/ACT inhaler Inhale 2 puffs into the lungs every 6 (six) hours as needed for wheezing. Qty: 1 Inhaler, Refills: 2    albuterol (PROVENTIL) (2.5 MG/3ML) 0.083% nebulizer solution Take 3 mLs (2.5 mg total) by nebulization every 6 (six) hours as needed for wheezing or shortness of breath. Qty: 150 mL, Refills: 0    aspirin EC 81 MG tablet Take 81 mg by mouth daily.    atorvastatin (LIPITOR) 80 MG tablet TAKE ONE (1) TABLET EACH DAY Qty: 90 tablet, Refills: 0    baclofen (LIORESAL) 10 MG tablet Take 10 mg by mouth daily as needed for muscle spasms.     budesonide-formoterol (SYMBICORT) 160-4.5 MCG/ACT inhaler Inhale 2 puffs into the  lungs 2 (two) times daily. Qty: 1 Inhaler, Refills:  3    clopidogrel (PLAVIX) 75 MG tablet TAKE ONE TABLET EVERY MORNING WITH BREAKFAST Qty: 30 tablet, Refills: 2    dicyclomine (BENTYL) 10 MG capsule TAKE ONE CAPSULE EVERY MORNING AND ONE CAPSULE AT BEDTIME AS NEEDED FOR INCREASED STOOLS AND BLOATING Qty: 60 capsule, Refills: 5    donepezil (ARICEPT) 10 MG tablet Take 1 tablet (10 mg total) by mouth at bedtime. Qty: 90 tablet, Refills: 3    DULoxetine (CYMBALTA) 60 MG capsule TAKE ONE (1) CAPSULE EACH DAY Qty: 30 capsule, Refills: 5    fexofenadine (ALLEGRA) 180 MG tablet Take 180 mg by mouth daily as needed for allergies.     furosemide (LASIX) 20 MG tablet TAKE ONE (1) TABLET EACH DAY Qty: 90 tablet, Refills: 0    gabapentin (NEURONTIN) 300 MG capsule TAKE ONE TO TWO CAPSULES BY MOUTH THREE TIMES AS DAY AS NEEDED AS NEEDED Qty: 180 capsule, Refills: 6    guaiFENesin (MUCINEX) 600 MG 12 hr tablet Take 1,200 mg by mouth 2 (two) times daily.     HUMALOG KWIKPEN 100 UNIT/ML KiwkPen Inject 0.32-0.38 mLs (32-38 Units total) into the skin 3 (three) times daily. Qty: 30 pen, Refills: 2    HYDROcodone-acetaminophen (LORTAB) 7.5-500 MG tablet Take 1 tablet by mouth every 6 (six) hours as needed for pain.    Insulin Detemir (LEVEMIR FLEXTOUCH) 100 UNIT/ML Pen Inject 90 Units into the skin daily at 10 pm. Qty: 30 pen, Refills: 2    isosorbide mononitrate (IMDUR) 30 MG 24 hr tablet Take 1 tablet (30 mg total) by mouth daily. Qty: 30 tablet, Refills: 6    losartan (COZAAR) 50 MG tablet Take 1 tablet (50 mg total) by mouth daily. Qty: 90 tablet, Refills: 3    magnesium oxide (MAG-OX) 400 MG tablet TAKE ONE TABLET ONCE DAILY Qty: 30 tablet, Refills: 11    metoprolol tartrate (LOPRESSOR) 25 MG tablet TAKE ONE TABLET BY MOUTH TWICE A DAY Qty: 60 tablet, Refills: 6    olopatadine (PATANOL) 0.1 % ophthalmic solution Place 1 drop into both eyes 2 (two) times daily. Qty: 5 mL, Refills: 12      Probiotic Product (ALIGN PO) Take 1 tablet by mouth daily.     traZODone (DESYREL) 100 MG tablet TAKE 1/2 TO 1 TABLET AT BEDTIME AS NEEDED FOR SLEEP Qty: 30 tablet, Refills: 5    vitamin B-12 (CYANOCOBALAMIN) 1000 MCG tablet Take 1,000 mcg by mouth daily. Reported on 08/25/2015    ALPRAZolam (XANAX) 1 MG tablet TAKE 1/2-1 TABLET 2 TIMES A DAY AS NEEDED Qty: 60 tablet, Refills: 3    nitroGLYCERIN (NITROSTAT) 0.4 MG SL tablet Place 1 tablet (0.4 mg total) under the tongue every 5 (five) minutes as needed. For chest pain Qty: 25 tablet, Refills: 3      STOP taking these medications     RABEprazole (ACIPHEX) 20 MG tablet      ranitidine (ZANTAC) 150 MG tablet      sildenafil (REVATIO) 20 MG tablet          Aspirin prescribed at discharge?  Yes High Intensity Statin Prescribed? (Lipitor 40-80mg  or Crestor 20-40mg ): Yes Beta Blocker Prescribed? Yes For EF <40%, was ACEI/ARB Prescribed? Yes ADP Receptor Inhibitor Prescribed? (i.e. Plavix etc.-Includes Medically Managed Patients): Yes For EF <40%, Aldosterone Inhibitor Prescribed? No: EF ok Was EF assessed during THIS hospitalization? Yes Was Cardiac Rehab II ordered? (Included Medically managed Patients): Yes   Outstanding Labs/Studies   N/A  Duration of Discharge Encounter  Greater than 30 minutes including physician time.  Signed, Reino Bellis NP-C 09/16/2016, 10:46 AM

## 2016-09-20 ENCOUNTER — Encounter: Payer: Self-pay | Admitting: Cardiology

## 2016-09-20 ENCOUNTER — Ambulatory Visit (INDEPENDENT_AMBULATORY_CARE_PROVIDER_SITE_OTHER): Payer: Medicare Other | Admitting: Cardiology

## 2016-09-20 VITALS — BP 130/76 | HR 79 | Wt 234.0 lb

## 2016-09-20 DIAGNOSIS — I2510 Atherosclerotic heart disease of native coronary artery without angina pectoris: Secondary | ICD-10-CM

## 2016-09-20 DIAGNOSIS — I255 Ischemic cardiomyopathy: Secondary | ICD-10-CM

## 2016-09-20 DIAGNOSIS — I10 Essential (primary) hypertension: Secondary | ICD-10-CM

## 2016-09-20 NOTE — Patient Instructions (Signed)
Medication Instructions:   Your physician recommends that you continue on your current medications as directed. Please refer to the Current Medication list given to you today.  Labwork:  none  Testing/Procedures:  none  Follow-Up:  Your physician recommends that you schedule a follow-up appointment in: 3 months.  Any Other Special Instructions Will Be Listed Below (If Applicable).  If you need a refill on your cardiac medications before your next appointment, please call your pharmacy. 
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
## 2016-09-21 ENCOUNTER — Encounter: Payer: Self-pay | Admitting: Internal Medicine

## 2016-09-21 ENCOUNTER — Ambulatory Visit (INDEPENDENT_AMBULATORY_CARE_PROVIDER_SITE_OTHER): Payer: Medicare Other | Admitting: Internal Medicine

## 2016-09-21 ENCOUNTER — Ambulatory Visit: Payer: Medicare Other | Admitting: Nurse Practitioner

## 2016-09-21 VITALS — BP 133/82 | HR 78 | Temp 97.0°F | Ht 69.0 in | Wt 232.0 lb

## 2016-09-21 DIAGNOSIS — I255 Ischemic cardiomyopathy: Secondary | ICD-10-CM | POA: Diagnosis not present

## 2016-09-21 DIAGNOSIS — K219 Gastro-esophageal reflux disease without esophagitis: Secondary | ICD-10-CM | POA: Diagnosis not present

## 2016-09-21 DIAGNOSIS — R1310 Dysphagia, unspecified: Secondary | ICD-10-CM

## 2016-09-21 DIAGNOSIS — R1319 Other dysphagia: Secondary | ICD-10-CM

## 2016-09-21 DIAGNOSIS — R143 Flatulence: Secondary | ICD-10-CM | POA: Diagnosis not present

## 2016-09-21 DIAGNOSIS — E230 Hypopituitarism: Secondary | ICD-10-CM | POA: Diagnosis not present

## 2016-09-21 NOTE — Patient Instructions (Signed)
GERD information provided  Continue Protonix 40 mg daily  Try Restora - one capsule daily for gas symptoms - samples provided  Office visit with [place] in 3 months  Repeat colonoscopy in 5 years
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
## 2016-09-24 ENCOUNTER — Other Ambulatory Visit: Payer: Self-pay | Admitting: Cardiology

## 2016-09-30 ENCOUNTER — Ambulatory Visit (INDEPENDENT_AMBULATORY_CARE_PROVIDER_SITE_OTHER): Payer: Medicare Other | Admitting: Otolaryngology

## 2016-09-30 DIAGNOSIS — R040 Epistaxis: Secondary | ICD-10-CM | POA: Diagnosis not present

## 2016-10-05 DIAGNOSIS — E230 Hypopituitarism: Secondary | ICD-10-CM | POA: Diagnosis not present

## 2016-10-06 ENCOUNTER — Ambulatory Visit: Payer: Medicare Other | Admitting: Nurse Practitioner

## 2016-10-07 ENCOUNTER — Ambulatory Visit (INDEPENDENT_AMBULATORY_CARE_PROVIDER_SITE_OTHER): Payer: Medicare Other | Admitting: *Deleted

## 2016-10-07 ENCOUNTER — Other Ambulatory Visit: Payer: Self-pay | Admitting: Family Medicine

## 2016-10-07 DIAGNOSIS — I2510 Atherosclerotic heart disease of native coronary artery without angina pectoris: Secondary | ICD-10-CM | POA: Diagnosis not present

## 2016-10-07 DIAGNOSIS — I255 Ischemic cardiomyopathy: Secondary | ICD-10-CM | POA: Diagnosis not present

## 2016-10-07 DIAGNOSIS — E1142 Type 2 diabetes mellitus with diabetic polyneuropathy: Secondary | ICD-10-CM | POA: Diagnosis not present

## 2016-10-07 DIAGNOSIS — E230 Hypopituitarism: Secondary | ICD-10-CM | POA: Diagnosis not present

## 2016-10-07 DIAGNOSIS — Z794 Long term (current) use of insulin: Secondary | ICD-10-CM | POA: Diagnosis not present

## 2016-10-07 DIAGNOSIS — Z5181 Encounter for therapeutic drug level monitoring: Secondary | ICD-10-CM | POA: Diagnosis not present

## 2016-10-07 DIAGNOSIS — Z125 Encounter for screening for malignant neoplasm of prostate: Secondary | ICD-10-CM | POA: Diagnosis not present

## 2016-10-07 NOTE — Telephone Encounter (Signed)
Last seen 09/21/16

## 2016-10-08 NOTE — Progress Notes (Signed)
Remote ICD transmission.   

## 2016-10-11 ENCOUNTER — Encounter (HOSPITAL_COMMUNITY): Payer: Medicare Other

## 2016-10-11 DIAGNOSIS — M5137 Other intervertebral disc degeneration, lumbosacral region: Secondary | ICD-10-CM | POA: Diagnosis not present

## 2016-10-13 ENCOUNTER — Encounter: Payer: Self-pay | Admitting: Cardiology

## 2016-10-19 DIAGNOSIS — E230 Hypopituitarism: Secondary | ICD-10-CM | POA: Diagnosis not present

## 2016-10-20 ENCOUNTER — Other Ambulatory Visit: Payer: Self-pay | Admitting: "Endocrinology

## 2016-10-22 ENCOUNTER — Encounter (HOSPITAL_COMMUNITY): Admission: RE | Admit: 2016-10-22 | Payer: Medicare Other | Source: Ambulatory Visit

## 2016-10-28 LAB — CUP PACEART REMOTE DEVICE CHECK
Battery Remaining Longevity: 50 mo
Battery Remaining Percentage: 51 %
Battery Voltage: 2.93 V
Brady Statistic AP VP Percent: 1 %
Brady Statistic AP VS Percent: 1 %
Brady Statistic AS VP Percent: 1 %
Brady Statistic AS VS Percent: 99 %
Brady Statistic RA Percent Paced: 1 %
Brady Statistic RV Percent Paced: 1 %
Date Time Interrogation Session: 20180607080016
HighPow Impedance: 54 Ohm
HighPow Impedance: 54 Ohm
Implantable Lead Implant Date: 20050706
Implantable Lead Implant Date: 20161128
Implantable Lead Location: 753859
Implantable Lead Location: 753860
Implantable Lead Model: 7122
Implantable Pulse Generator Implant Date: 20130727
Lead Channel Impedance Value: 390 Ohm
Lead Channel Impedance Value: 490 Ohm
Lead Channel Pacing Threshold Amplitude: 0.75 V
Lead Channel Pacing Threshold Amplitude: 0.75 V
Lead Channel Pacing Threshold Pulse Width: 0.5 ms
Lead Channel Pacing Threshold Pulse Width: 0.5 ms
Lead Channel Sensing Intrinsic Amplitude: 12 mV
Lead Channel Sensing Intrinsic Amplitude: 3.4 mV
Lead Channel Setting Pacing Amplitude: 2 V
Lead Channel Setting Pacing Amplitude: 2.5 V
Lead Channel Setting Pacing Pulse Width: 0.5 ms
Lead Channel Setting Sensing Sensitivity: 0.5 mV
Pulse Gen Serial Number: 7041246

## 2016-11-02 DIAGNOSIS — M5416 Radiculopathy, lumbar region: Secondary | ICD-10-CM | POA: Diagnosis not present

## 2016-11-02 DIAGNOSIS — R202 Paresthesia of skin: Secondary | ICD-10-CM | POA: Diagnosis not present

## 2016-11-02 DIAGNOSIS — E291 Testicular hypofunction: Secondary | ICD-10-CM | POA: Diagnosis not present

## 2016-11-02 DIAGNOSIS — M545 Low back pain: Secondary | ICD-10-CM | POA: Diagnosis not present

## 2016-11-02 DIAGNOSIS — M792 Neuralgia and neuritis, unspecified: Secondary | ICD-10-CM | POA: Diagnosis not present

## 2016-11-05 ENCOUNTER — Other Ambulatory Visit: Payer: Self-pay | Admitting: Family Medicine

## 2016-11-05 DIAGNOSIS — R202 Paresthesia of skin: Secondary | ICD-10-CM | POA: Diagnosis not present

## 2016-11-05 DIAGNOSIS — M792 Neuralgia and neuritis, unspecified: Secondary | ICD-10-CM | POA: Diagnosis not present

## 2016-11-05 DIAGNOSIS — M5416 Radiculopathy, lumbar region: Secondary | ICD-10-CM | POA: Diagnosis not present

## 2016-11-05 DIAGNOSIS — M545 Low back pain: Secondary | ICD-10-CM | POA: Diagnosis not present

## 2016-11-08 DIAGNOSIS — R202 Paresthesia of skin: Secondary | ICD-10-CM | POA: Diagnosis not present

## 2016-11-08 DIAGNOSIS — M545 Low back pain: Secondary | ICD-10-CM | POA: Diagnosis not present

## 2016-11-08 DIAGNOSIS — M5416 Radiculopathy, lumbar region: Secondary | ICD-10-CM | POA: Diagnosis not present

## 2016-11-08 DIAGNOSIS — M792 Neuralgia and neuritis, unspecified: Secondary | ICD-10-CM | POA: Diagnosis not present

## 2016-11-09 ENCOUNTER — Ambulatory Visit: Payer: Medicare Other | Admitting: Allergy & Immunology

## 2016-11-10 DIAGNOSIS — M5416 Radiculopathy, lumbar region: Secondary | ICD-10-CM | POA: Diagnosis not present

## 2016-11-10 DIAGNOSIS — M792 Neuralgia and neuritis, unspecified: Secondary | ICD-10-CM | POA: Diagnosis not present

## 2016-11-10 DIAGNOSIS — M545 Low back pain: Secondary | ICD-10-CM | POA: Diagnosis not present

## 2016-11-10 DIAGNOSIS — R202 Paresthesia of skin: Secondary | ICD-10-CM | POA: Diagnosis not present

## 2016-11-11 ENCOUNTER — Encounter (HOSPITAL_COMMUNITY): Payer: Medicare Other

## 2016-11-12 DIAGNOSIS — M545 Low back pain: Secondary | ICD-10-CM | POA: Diagnosis not present

## 2016-11-12 DIAGNOSIS — M5416 Radiculopathy, lumbar region: Secondary | ICD-10-CM | POA: Diagnosis not present

## 2016-11-12 DIAGNOSIS — M792 Neuralgia and neuritis, unspecified: Secondary | ICD-10-CM | POA: Diagnosis not present

## 2016-11-12 DIAGNOSIS — R202 Paresthesia of skin: Secondary | ICD-10-CM | POA: Diagnosis not present

## 2016-11-16 DIAGNOSIS — M545 Low back pain: Secondary | ICD-10-CM | POA: Diagnosis not present

## 2016-11-16 DIAGNOSIS — M5416 Radiculopathy, lumbar region: Secondary | ICD-10-CM | POA: Diagnosis not present

## 2016-11-16 DIAGNOSIS — R202 Paresthesia of skin: Secondary | ICD-10-CM | POA: Diagnosis not present

## 2016-11-16 DIAGNOSIS — E230 Hypopituitarism: Secondary | ICD-10-CM | POA: Diagnosis not present

## 2016-11-16 DIAGNOSIS — M792 Neuralgia and neuritis, unspecified: Secondary | ICD-10-CM | POA: Diagnosis not present

## 2016-11-17 DIAGNOSIS — M792 Neuralgia and neuritis, unspecified: Secondary | ICD-10-CM | POA: Diagnosis not present

## 2016-11-17 DIAGNOSIS — L280 Lichen simplex chronicus: Secondary | ICD-10-CM | POA: Diagnosis not present

## 2016-11-17 DIAGNOSIS — R202 Paresthesia of skin: Secondary | ICD-10-CM | POA: Diagnosis not present

## 2016-11-17 DIAGNOSIS — X32XXXA Exposure to sunlight, initial encounter: Secondary | ICD-10-CM | POA: Diagnosis not present

## 2016-11-17 DIAGNOSIS — L720 Epidermal cyst: Secondary | ICD-10-CM | POA: Diagnosis not present

## 2016-11-17 DIAGNOSIS — L570 Actinic keratosis: Secondary | ICD-10-CM | POA: Diagnosis not present

## 2016-11-17 DIAGNOSIS — M5416 Radiculopathy, lumbar region: Secondary | ICD-10-CM | POA: Diagnosis not present

## 2016-11-17 DIAGNOSIS — M545 Low back pain: Secondary | ICD-10-CM | POA: Diagnosis not present

## 2016-11-19 DIAGNOSIS — M545 Low back pain: Secondary | ICD-10-CM | POA: Diagnosis not present

## 2016-11-19 DIAGNOSIS — R202 Paresthesia of skin: Secondary | ICD-10-CM | POA: Diagnosis not present

## 2016-11-19 DIAGNOSIS — M792 Neuralgia and neuritis, unspecified: Secondary | ICD-10-CM | POA: Diagnosis not present

## 2016-11-19 DIAGNOSIS — M5416 Radiculopathy, lumbar region: Secondary | ICD-10-CM | POA: Diagnosis not present

## 2016-11-23 DIAGNOSIS — M792 Neuralgia and neuritis, unspecified: Secondary | ICD-10-CM | POA: Diagnosis not present

## 2016-11-23 DIAGNOSIS — R202 Paresthesia of skin: Secondary | ICD-10-CM | POA: Diagnosis not present

## 2016-11-23 DIAGNOSIS — M545 Low back pain: Secondary | ICD-10-CM | POA: Diagnosis not present

## 2016-11-23 DIAGNOSIS — M5416 Radiculopathy, lumbar region: Secondary | ICD-10-CM | POA: Diagnosis not present

## 2016-11-24 DIAGNOSIS — M545 Low back pain: Secondary | ICD-10-CM | POA: Diagnosis not present

## 2016-11-24 DIAGNOSIS — M792 Neuralgia and neuritis, unspecified: Secondary | ICD-10-CM | POA: Diagnosis not present

## 2016-11-24 DIAGNOSIS — M5416 Radiculopathy, lumbar region: Secondary | ICD-10-CM | POA: Diagnosis not present

## 2016-11-24 DIAGNOSIS — R202 Paresthesia of skin: Secondary | ICD-10-CM | POA: Diagnosis not present

## 2016-11-30 ENCOUNTER — Telehealth: Payer: Self-pay | Admitting: Family Medicine

## 2016-11-30 ENCOUNTER — Other Ambulatory Visit: Payer: Self-pay | Admitting: Family Medicine

## 2016-11-30 DIAGNOSIS — E230 Hypopituitarism: Secondary | ICD-10-CM | POA: Diagnosis not present

## 2016-11-30 NOTE — Telephone Encounter (Addendum)
Patient said that he has been having trouble with his bladder.  He said that ever since he had all kinds of tests ran on him about a month ago, he has had trouble with urinating.  He said he has to sit and wait on the toilet before he can finally urinate.  No pain or burning.  He wants to know if Dr. Nicki Reaper can work him in his schedule sometime in the next few days.

## 2016-11-30 NOTE — Telephone Encounter (Signed)
Appointment scheduled.

## 2016-11-30 NOTE — Telephone Encounter (Signed)
Please give the patient is an appointment for Wednesday

## 2016-12-01 ENCOUNTER — Encounter: Payer: Self-pay | Admitting: Family Medicine

## 2016-12-01 ENCOUNTER — Ambulatory Visit (INDEPENDENT_AMBULATORY_CARE_PROVIDER_SITE_OTHER): Payer: Medicare Other | Admitting: Family Medicine

## 2016-12-01 VITALS — BP 124/80 | Ht 69.0 in | Wt 231.1 lb

## 2016-12-01 DIAGNOSIS — R3911 Hesitancy of micturition: Secondary | ICD-10-CM | POA: Diagnosis not present

## 2016-12-01 DIAGNOSIS — N401 Enlarged prostate with lower urinary tract symptoms: Secondary | ICD-10-CM | POA: Diagnosis not present

## 2016-12-01 DIAGNOSIS — R300 Dysuria: Secondary | ICD-10-CM

## 2016-12-01 DIAGNOSIS — I255 Ischemic cardiomyopathy: Secondary | ICD-10-CM | POA: Diagnosis not present

## 2016-12-01 DIAGNOSIS — R350 Frequency of micturition: Secondary | ICD-10-CM

## 2016-12-01 LAB — POCT URINALYSIS DIPSTICK
Glucose, UA: >1000
Spec Grav, UA: 1.01 (ref 1.010–1.025)
pH, UA: 6 (ref 5.0–8.0)

## 2016-12-01 MED ORDER — TAMSULOSIN HCL 0.4 MG PO CAPS: 0.4000 mg | ORAL_CAPSULE | Freq: Every day | ORAL | 3 refills | Status: DC

## 2016-12-01 NOTE — Progress Notes (Signed)
   Subjective:    Patient ID: Lance [name], male    DOB: 02-18-1946, 71 y.o.   MRN: [id number]  HPI Patient comes in today states he has had urinary hesitancy for a month and a half. States left testical is sore. No burning,but does have frequency. No other concerns. No dysuria and no urinary bleeding is having frequency and hesitancy relates no headaches chest pressure or shortness of breath Results for orders placed or performed in visit on 12/01/16  POCT urinalysis dipstick  Result Value Ref Range   Color, UA     Clarity, UA     Glucose, UA >1,000    Bilirubin, UA     Ketones, UA     Spec Grav, UA 1.010 1.010 - 1.025   Blood, UA     pH, UA 6.0 5.0 - 8.0   Protein, UA     Urobilinogen, UA  0.2 or 1.0 E.U./dL   Nitrite, UA     Leukocytes, UA  Negative   *Note: Due to a large number of results and/or encounters for the requested time period, some results have not been displayed. A complete set of results can be found in Results Review.       Review of Systems Please see above    Objective:   Physical Exam Lungs clear heart regular pulse normal flanks nontender abdomen soft prostate enlarged urinalysis negative       Assessment & Plan:  BPH Flomax If any problems notify us If not seen significant improvement over the next week notify us we'll set him up with urology for possible procedure for BPH

## 2016-12-02 LAB — BASIC METABOLIC PANEL
BUN/Creatinine Ratio: 9 — ABNORMAL LOW (ref 10–24)
BUN: 10 mg/dL (ref 8–27)
CO2: 26 mmol/L (ref 20–29)
Calcium: 9.1 mg/dL (ref 8.6–10.2)
Chloride: 97 mmol/L (ref 96–106)
Creatinine, Ser: 1.13 mg/dL (ref 0.76–1.27)
GFR calc Af Amer: 77 mL/min/{1.73_m2} (ref >59)
GFR calc non Af Amer: 66 mL/min/{1.73_m2} (ref >59)
Glucose: 188 mg/dL — ABNORMAL HIGH (ref 65–99)
Potassium: 4.2 mmol/L (ref 3.5–5.2)
Sodium: 139 mmol/L (ref 134–144)

## 2016-12-02 LAB — PSA: Prostate Specific Ag, Serum: 2.2 ng/mL (ref 0.0–4.0)

## 2016-12-03 DIAGNOSIS — R202 Paresthesia of skin: Secondary | ICD-10-CM | POA: Diagnosis not present

## 2016-12-03 DIAGNOSIS — M792 Neuralgia and neuritis, unspecified: Secondary | ICD-10-CM | POA: Diagnosis not present

## 2016-12-03 DIAGNOSIS — M545 Low back pain: Secondary | ICD-10-CM | POA: Diagnosis not present

## 2016-12-03 DIAGNOSIS — M5416 Radiculopathy, lumbar region: Secondary | ICD-10-CM | POA: Diagnosis not present

## 2016-12-08 DIAGNOSIS — M5416 Radiculopathy, lumbar region: Secondary | ICD-10-CM | POA: Diagnosis not present

## 2016-12-08 DIAGNOSIS — M545 Low back pain: Secondary | ICD-10-CM | POA: Diagnosis not present

## 2016-12-08 DIAGNOSIS — R202 Paresthesia of skin: Secondary | ICD-10-CM | POA: Diagnosis not present

## 2016-12-08 DIAGNOSIS — M792 Neuralgia and neuritis, unspecified: Secondary | ICD-10-CM | POA: Diagnosis not present

## 2016-12-10 DIAGNOSIS — M5416 Radiculopathy, lumbar region: Secondary | ICD-10-CM | POA: Diagnosis not present

## 2016-12-10 DIAGNOSIS — M792 Neuralgia and neuritis, unspecified: Secondary | ICD-10-CM | POA: Diagnosis not present

## 2016-12-10 DIAGNOSIS — M545 Low back pain: Secondary | ICD-10-CM | POA: Diagnosis not present

## 2016-12-10 DIAGNOSIS — R202 Paresthesia of skin: Secondary | ICD-10-CM | POA: Diagnosis not present

## 2016-12-13 DIAGNOSIS — M792 Neuralgia and neuritis, unspecified: Secondary | ICD-10-CM | POA: Diagnosis not present

## 2016-12-13 DIAGNOSIS — M545 Low back pain: Secondary | ICD-10-CM | POA: Diagnosis not present

## 2016-12-13 DIAGNOSIS — R202 Paresthesia of skin: Secondary | ICD-10-CM | POA: Diagnosis not present

## 2016-12-13 DIAGNOSIS — M5416 Radiculopathy, lumbar region: Secondary | ICD-10-CM | POA: Diagnosis not present

## 2016-12-14 ENCOUNTER — Ambulatory Visit (INDEPENDENT_AMBULATORY_CARE_PROVIDER_SITE_OTHER): Payer: Medicare Other | Admitting: Family Medicine

## 2016-12-14 VITALS — BP 120/70 | Temp 99.0°F | Ht 69.0 in | Wt 234.1 lb

## 2016-12-14 DIAGNOSIS — I255 Ischemic cardiomyopathy: Secondary | ICD-10-CM | POA: Diagnosis not present

## 2016-12-14 DIAGNOSIS — L02419 Cutaneous abscess of limb, unspecified: Secondary | ICD-10-CM

## 2016-12-14 DIAGNOSIS — E230 Hypopituitarism: Secondary | ICD-10-CM | POA: Diagnosis not present

## 2016-12-14 MED ORDER — MUPIROCIN 2 % EX OINT: TOPICAL_OINTMENT | CUTANEOUS | 0 refills | Status: AC

## 2016-12-14 MED ORDER — DOXYCYCLINE HYCLATE 100 MG PO TABS: 100.0000 mg | ORAL_TABLET | Freq: Two times a day (BID) | ORAL | 0 refills | Status: DC

## 2016-12-14 NOTE — Progress Notes (Signed)
   Subjective:    Patient ID: Lance [name], male    DOB: 1945/09/29, 71 y.o.   MRN: [id number]  HPI Patient states he saw Dr. Allyn Kenner for a skin biopsy left leg,It now looks infected.Complains of feeling light headed lately Orhostatics done.Patient with sore discomfort pressure in the back of his leg with some drainage denies high fever chills sweats denies wheezing difficulty breathing patient had an area removed recently Review of Systems Denies fever sweats nausea vomiting diarrhea other rashes    Objective:   Physical Exam Lungs clear heart regular has a sore on the back leg with drainage I believe that this was a small abscess it's now draining it does not need any incision or drainage currently       Assessment & Plan:  Culture obtained Abscess Warm compresses frequently Doxycycline 10 days Bactroban when necessary Follow-up within 10 days Warnings discussed follow-up sooner problems

## 2016-12-15 DIAGNOSIS — R202 Paresthesia of skin: Secondary | ICD-10-CM | POA: Diagnosis not present

## 2016-12-15 DIAGNOSIS — M5416 Radiculopathy, lumbar region: Secondary | ICD-10-CM | POA: Diagnosis not present

## 2016-12-15 DIAGNOSIS — M545 Low back pain: Secondary | ICD-10-CM | POA: Diagnosis not present

## 2016-12-15 DIAGNOSIS — M792 Neuralgia and neuritis, unspecified: Secondary | ICD-10-CM | POA: Diagnosis not present

## 2016-12-16 DIAGNOSIS — M542 Cervicalgia: Secondary | ICD-10-CM | POA: Diagnosis not present

## 2016-12-16 DIAGNOSIS — M4712 Other spondylosis with myelopathy, cervical region: Secondary | ICD-10-CM | POA: Diagnosis not present

## 2016-12-16 DIAGNOSIS — G5603 Carpal tunnel syndrome, bilateral upper limbs: Secondary | ICD-10-CM | POA: Diagnosis not present

## 2016-12-18 LAB — WOUND CULTURE

## 2016-12-21 ENCOUNTER — Encounter: Payer: Self-pay | Admitting: Family Medicine

## 2016-12-21 ENCOUNTER — Ambulatory Visit (INDEPENDENT_AMBULATORY_CARE_PROVIDER_SITE_OTHER): Payer: Medicare Other | Admitting: Family Medicine

## 2016-12-21 VITALS — BP 100/62 | Ht 69.0 in | Wt 236.0 lb

## 2016-12-21 DIAGNOSIS — I255 Ischemic cardiomyopathy: Secondary | ICD-10-CM | POA: Diagnosis not present

## 2016-12-21 DIAGNOSIS — L03119 Cellulitis of unspecified part of limb: Secondary | ICD-10-CM

## 2016-12-21 MED ORDER — DOXYCYCLINE HYCLATE 100 MG PO TABS: 100.0000 mg | ORAL_TABLET | Freq: Two times a day (BID) | ORAL | 0 refills | Status: DC

## 2016-12-21 NOTE — Progress Notes (Signed)
   Subjective:    Patient ID: Lance [name], male    DOB: 11/28/1945, 71 y.o.   MRN: [id number]  HPI Patient is here today to follow up on his leg wound states it feels better.Complains of back pain as well. He has diabetes recently had skin lesion removed it was negative for cancer it was recommended for the patient to do a follow-up with dermatology in a few months but it got infected's of therefore he was put on doxycycline we are rechecking him today  Review of Systems No fever vomiting no knee pain and ankle pain    Objective:   Physical Exam Patient has a 1 inch area on the calf that is in open healing has some secondary eschar but no sign of saline is this seems to be showing some signs of healing       Assessment & Plan:  Localized wound will take up to a few more weeks for it to heal recheck in one month's time follow-up sooner if progressive troubles or worse

## 2016-12-22 ENCOUNTER — Ambulatory Visit: Payer: Medicare Other | Admitting: Gastroenterology

## 2016-12-22 DIAGNOSIS — Z125 Encounter for screening for malignant neoplasm of prostate: Secondary | ICD-10-CM | POA: Diagnosis not present

## 2016-12-22 DIAGNOSIS — Z794 Long term (current) use of insulin: Secondary | ICD-10-CM | POA: Diagnosis not present

## 2016-12-22 DIAGNOSIS — R3912 Poor urinary stream: Secondary | ICD-10-CM | POA: Diagnosis not present

## 2016-12-22 DIAGNOSIS — E230 Hypopituitarism: Secondary | ICD-10-CM | POA: Diagnosis not present

## 2016-12-22 DIAGNOSIS — I2510 Atherosclerotic heart disease of native coronary artery without angina pectoris: Secondary | ICD-10-CM | POA: Diagnosis not present

## 2016-12-22 DIAGNOSIS — E1142 Type 2 diabetes mellitus with diabetic polyneuropathy: Secondary | ICD-10-CM | POA: Diagnosis not present

## 2016-12-22 DIAGNOSIS — Z5181 Encounter for therapeutic drug level monitoring: Secondary | ICD-10-CM | POA: Diagnosis not present

## 2016-12-25 DIAGNOSIS — E230 Hypopituitarism: Secondary | ICD-10-CM | POA: Diagnosis not present

## 2016-12-25 DIAGNOSIS — I2510 Atherosclerotic heart disease of native coronary artery without angina pectoris: Secondary | ICD-10-CM | POA: Diagnosis not present

## 2016-12-25 DIAGNOSIS — Z5181 Encounter for therapeutic drug level monitoring: Secondary | ICD-10-CM | POA: Diagnosis not present

## 2016-12-25 DIAGNOSIS — E1142 Type 2 diabetes mellitus with diabetic polyneuropathy: Secondary | ICD-10-CM | POA: Diagnosis not present

## 2016-12-27 ENCOUNTER — Ambulatory Visit (INDEPENDENT_AMBULATORY_CARE_PROVIDER_SITE_OTHER): Payer: Medicare Other | Admitting: Cardiology

## 2016-12-27 ENCOUNTER — Encounter: Payer: Self-pay | Admitting: Cardiology

## 2016-12-27 VITALS — BP 122/68 | HR 69 | Ht 69.0 in | Wt 232.0 lb

## 2016-12-27 DIAGNOSIS — I255 Ischemic cardiomyopathy: Secondary | ICD-10-CM | POA: Diagnosis not present

## 2016-12-27 DIAGNOSIS — E782 Mixed hyperlipidemia: Secondary | ICD-10-CM | POA: Diagnosis not present

## 2016-12-27 DIAGNOSIS — I10 Essential (primary) hypertension: Secondary | ICD-10-CM

## 2016-12-27 DIAGNOSIS — I5022 Chronic systolic (congestive) heart failure: Secondary | ICD-10-CM | POA: Diagnosis not present

## 2016-12-27 DIAGNOSIS — I2510 Atherosclerotic heart disease of native coronary artery without angina pectoris: Secondary | ICD-10-CM | POA: Diagnosis not present

## 2016-12-27 NOTE — Patient Instructions (Signed)
Medication Instructions:  Your physician recommends that you continue on your current medications as directed. Please refer to the Current Medication list given to you today.   Labwork: I will request a copy of labs form [facility]- Dr. [name]  Testing/Procedures: none  Follow-Up: Your physician wants you to follow-up in: 6 months.  You will receive a reminder letter in the mail two months in advance. If you don't receive a letter, please call our office to schedule the follow-up appointment.   Any Other Special Instructions Will Be Listed Below (If Applicable).     If you need a refill on your cardiac medications before your next appointment, please call your pharmacy.
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
## 2016-12-28 DIAGNOSIS — M792 Neuralgia and neuritis, unspecified: Secondary | ICD-10-CM | POA: Diagnosis not present

## 2016-12-28 DIAGNOSIS — M545 Low back pain: Secondary | ICD-10-CM | POA: Diagnosis not present

## 2016-12-28 DIAGNOSIS — M5416 Radiculopathy, lumbar region: Secondary | ICD-10-CM | POA: Diagnosis not present

## 2016-12-28 DIAGNOSIS — R202 Paresthesia of skin: Secondary | ICD-10-CM | POA: Diagnosis not present

## 2016-12-29 ENCOUNTER — Other Ambulatory Visit: Payer: Self-pay | Admitting: *Deleted

## 2016-12-29 ENCOUNTER — Other Ambulatory Visit: Payer: Self-pay

## 2016-12-29 DIAGNOSIS — R202 Paresthesia of skin: Secondary | ICD-10-CM | POA: Diagnosis not present

## 2016-12-29 DIAGNOSIS — M792 Neuralgia and neuritis, unspecified: Secondary | ICD-10-CM | POA: Diagnosis not present

## 2016-12-29 DIAGNOSIS — M545 Low back pain: Secondary | ICD-10-CM | POA: Diagnosis not present

## 2016-12-29 DIAGNOSIS — M5416 Radiculopathy, lumbar region: Secondary | ICD-10-CM | POA: Diagnosis not present

## 2016-12-29 MED ORDER — AMLODIPINE BESYLATE 5 MG PO TABS: 5.0000 mg | ORAL_TABLET | Freq: Every day | ORAL | 3 refills | Status: DC

## 2016-12-29 MED ORDER — TAMSULOSIN HCL 0.4 MG PO CAPS: 0.4000 mg | ORAL_CAPSULE | Freq: Every day | ORAL | 0 refills | Status: DC

## 2016-12-29 NOTE — Telephone Encounter (Signed)
Wants 90 day supply

## 2016-12-30 DIAGNOSIS — G5603 Carpal tunnel syndrome, bilateral upper limbs: Secondary | ICD-10-CM | POA: Diagnosis not present

## 2016-12-30 DIAGNOSIS — M4712 Other spondylosis with myelopathy, cervical region: Secondary | ICD-10-CM | POA: Diagnosis not present

## 2016-12-30 DIAGNOSIS — M542 Cervicalgia: Secondary | ICD-10-CM | POA: Diagnosis not present

## 2016-12-30 MED ORDER — DICYCLOMINE HCL 10 MG PO CAPS: ORAL_CAPSULE | ORAL | 5 refills | Status: DC

## 2016-12-31 DIAGNOSIS — M545 Low back pain: Secondary | ICD-10-CM | POA: Diagnosis not present

## 2016-12-31 DIAGNOSIS — M5416 Radiculopathy, lumbar region: Secondary | ICD-10-CM | POA: Diagnosis not present

## 2016-12-31 DIAGNOSIS — M792 Neuralgia and neuritis, unspecified: Secondary | ICD-10-CM | POA: Diagnosis not present

## 2016-12-31 DIAGNOSIS — R202 Paresthesia of skin: Secondary | ICD-10-CM | POA: Diagnosis not present

## 2017-01-04 DIAGNOSIS — R202 Paresthesia of skin: Secondary | ICD-10-CM | POA: Diagnosis not present

## 2017-01-04 DIAGNOSIS — M545 Low back pain: Secondary | ICD-10-CM | POA: Diagnosis not present

## 2017-01-04 DIAGNOSIS — M792 Neuralgia and neuritis, unspecified: Secondary | ICD-10-CM | POA: Diagnosis not present

## 2017-01-04 DIAGNOSIS — M5416 Radiculopathy, lumbar region: Secondary | ICD-10-CM | POA: Diagnosis not present

## 2017-01-05 ENCOUNTER — Other Ambulatory Visit: Payer: Self-pay | Admitting: Family Medicine

## 2017-01-05 DIAGNOSIS — M5416 Radiculopathy, lumbar region: Secondary | ICD-10-CM | POA: Diagnosis not present

## 2017-01-05 DIAGNOSIS — M545 Low back pain: Secondary | ICD-10-CM | POA: Diagnosis not present

## 2017-01-05 DIAGNOSIS — R202 Paresthesia of skin: Secondary | ICD-10-CM | POA: Diagnosis not present

## 2017-01-05 DIAGNOSIS — M792 Neuralgia and neuritis, unspecified: Secondary | ICD-10-CM | POA: Diagnosis not present

## 2017-01-06 ENCOUNTER — Encounter: Payer: Self-pay | Admitting: Neurology

## 2017-01-06 ENCOUNTER — Ambulatory Visit (INDEPENDENT_AMBULATORY_CARE_PROVIDER_SITE_OTHER): Payer: Medicare Other | Admitting: Neurology

## 2017-01-06 ENCOUNTER — Ambulatory Visit (INDEPENDENT_AMBULATORY_CARE_PROVIDER_SITE_OTHER): Payer: Medicare Other | Admitting: *Deleted

## 2017-01-06 VITALS — BP 118/70 | HR 59 | Ht 69.0 in | Wt 232.5 lb

## 2017-01-06 DIAGNOSIS — R413 Other amnesia: Secondary | ICD-10-CM | POA: Diagnosis not present

## 2017-01-06 DIAGNOSIS — I255 Ischemic cardiomyopathy: Secondary | ICD-10-CM
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
## 2017-01-07 NOTE — Progress Notes (Signed)
Remote ICD transmission.   

## 2017-01-11 ENCOUNTER — Encounter: Payer: Self-pay | Admitting: Cardiology

## 2017-01-11 DIAGNOSIS — M792 Neuralgia and neuritis, unspecified: Secondary | ICD-10-CM | POA: Diagnosis not present

## 2017-01-11 DIAGNOSIS — R202 Paresthesia of skin: Secondary | ICD-10-CM | POA: Diagnosis not present

## 2017-01-11 DIAGNOSIS — M5416 Radiculopathy, lumbar region: Secondary | ICD-10-CM | POA: Diagnosis not present

## 2017-01-11 DIAGNOSIS — M545 Low back pain: Secondary | ICD-10-CM | POA: Diagnosis not present

## 2017-01-12 DIAGNOSIS — M792 Neuralgia and neuritis, unspecified: Secondary | ICD-10-CM | POA: Diagnosis not present

## 2017-01-12 DIAGNOSIS — M545 Low back pain: Secondary | ICD-10-CM | POA: Diagnosis not present

## 2017-01-12 DIAGNOSIS — R202 Paresthesia of skin: Secondary | ICD-10-CM | POA: Diagnosis not present

## 2017-01-12 DIAGNOSIS — M5416 Radiculopathy, lumbar region: Secondary | ICD-10-CM | POA: Diagnosis not present

## 2017-01-13 DIAGNOSIS — M545 Low back pain: Secondary | ICD-10-CM | POA: Diagnosis not present

## 2017-01-13 DIAGNOSIS — R202 Paresthesia of skin: Secondary | ICD-10-CM | POA: Diagnosis not present

## 2017-01-13 DIAGNOSIS — M792 Neuralgia and neuritis, unspecified: Secondary | ICD-10-CM | POA: Diagnosis not present

## 2017-01-13 DIAGNOSIS — M5416 Radiculopathy, lumbar region: Secondary | ICD-10-CM | POA: Diagnosis not present

## 2017-01-14 ENCOUNTER — Other Ambulatory Visit: Payer: Self-pay | Admitting: Cardiology

## 2017-01-17 DIAGNOSIS — M542 Cervicalgia: Secondary | ICD-10-CM | POA: Diagnosis not present

## 2017-01-17 DIAGNOSIS — G894 Chronic pain syndrome: Secondary | ICD-10-CM | POA: Diagnosis not present

## 2017-01-17 DIAGNOSIS — G5603 Carpal tunnel syndrome, bilateral upper limbs: Secondary | ICD-10-CM | POA: Diagnosis not present

## 2017-01-18 ENCOUNTER — Encounter: Payer: Self-pay | Admitting: Family Medicine

## 2017-01-18 ENCOUNTER — Ambulatory Visit (INDEPENDENT_AMBULATORY_CARE_PROVIDER_SITE_OTHER): Payer: Medicare Other | Admitting: Family Medicine

## 2017-01-18 ENCOUNTER — Other Ambulatory Visit: Payer: Self-pay | Admitting: Internal Medicine

## 2017-01-18 VITALS — BP 124/70 | Temp 98.4°F | Ht 69.0 in | Wt 231.0 lb

## 2017-01-18 DIAGNOSIS — I255 Ischemic cardiomyopathy: Secondary | ICD-10-CM

## 2017-01-18 DIAGNOSIS — M5416 Radiculopathy, lumbar region: Secondary | ICD-10-CM | POA: Diagnosis not present

## 2017-01-18 DIAGNOSIS — M792 Neuralgia and neuritis, unspecified: Secondary | ICD-10-CM | POA: Diagnosis not present

## 2017-01-18 DIAGNOSIS — R202 Paresthesia of skin: Secondary | ICD-10-CM | POA: Diagnosis not present

## 2017-01-18 DIAGNOSIS — L989 Disorder of the skin and subcutaneous tissue, unspecified: Secondary | ICD-10-CM | POA: Diagnosis not present

## 2017-01-18 DIAGNOSIS — M545 Low back pain: Secondary | ICD-10-CM | POA: Diagnosis not present

## 2017-01-18 NOTE — Progress Notes (Signed)
   Subjective:    Patient ID: Lance [name], male    DOB: Jun 02, 1945, 71 y.o.   MRN: [id number]  HPIFollow up wound on left leg.  Patient with area on his leg and on his arms from previous biopsy from skin cancer treated by dermatology here for follow-up visit diabetic typically has poor healing but these areas are healing better no drainage redness or os   Review of Systems Negative for any fever chills cough chest congestion    Objective:   Physical Exam Lungs clear heart regular pulse normal Skin lesion on the leg firm skin but healing well skin lesion on the arm healing well Follow-up if any ongoing troubles       Assessment & Plan:  Skin lesion on the left leg where biopsy was is healing well without any difficulties continue current measures  Skin lesion on the arm healing well without any difficulty  Follow-up when necessary

## 2017-01-19 ENCOUNTER — Other Ambulatory Visit: Payer: Self-pay

## 2017-01-19 DIAGNOSIS — R202 Paresthesia of skin: Secondary | ICD-10-CM | POA: Diagnosis not present

## 2017-01-19 DIAGNOSIS — M5416 Radiculopathy, lumbar region: Secondary | ICD-10-CM | POA: Diagnosis not present

## 2017-01-19 DIAGNOSIS — M792 Neuralgia and neuritis, unspecified: Secondary | ICD-10-CM | POA: Diagnosis not present

## 2017-01-19 DIAGNOSIS — M545 Low back pain: Secondary | ICD-10-CM | POA: Diagnosis not present

## 2017-01-19 MED ORDER — METOPROLOL TARTRATE 25 MG PO TABS: 25.0000 mg | ORAL_TABLET | Freq: Two times a day (BID) | ORAL | 3 refills | Status: DC

## 2017-01-19 NOTE — Telephone Encounter (Signed)
Refill request for 90 day supply of metoprolol sent to [facility]

## 2017-01-20 ENCOUNTER — Encounter: Payer: Self-pay | Admitting: Gastroenterology

## 2017-01-20 ENCOUNTER — Ambulatory Visit (INDEPENDENT_AMBULATORY_CARE_PROVIDER_SITE_OTHER): Payer: Medicare Other | Admitting: Gastroenterology

## 2017-01-20 VITALS — BP 113/67 | HR 82 | Temp 98.1°F | Ht 68.0 in | Wt 227.4 lb

## 2017-01-20 DIAGNOSIS — I255 Ischemic cardiomyopathy: Secondary | ICD-10-CM | POA: Diagnosis not present

## 2017-01-20 DIAGNOSIS — K219 Gastro-esophageal reflux disease without esophagitis: Secondary | ICD-10-CM | POA: Diagnosis not present

## 2017-01-20 DIAGNOSIS — R109 Unspecified abdominal pain: Secondary | ICD-10-CM

## 2017-01-20 DIAGNOSIS — R6881 Early satiety: Secondary | ICD-10-CM | POA: Diagnosis not present

## 2017-01-20 DIAGNOSIS — R1012 Left upper quadrant pain: Secondary | ICD-10-CM | POA: Diagnosis not present

## 2017-01-20 DIAGNOSIS — R140 Abdominal distension (gaseous): Secondary | ICD-10-CM | POA: Diagnosis not present

## 2017-01-20 NOTE — Patient Instructions (Signed)
1. CT scan as scheduled. See separate instructions.
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
## 2017-01-24 ENCOUNTER — Ambulatory Visit (INDEPENDENT_AMBULATORY_CARE_PROVIDER_SITE_OTHER): Payer: Medicare Other | Admitting: Family Medicine

## 2017-01-24 ENCOUNTER — Encounter: Payer: Self-pay | Admitting: Family Medicine

## 2017-01-24 ENCOUNTER — Telehealth: Payer: Self-pay

## 2017-01-24 VITALS — BP 132/80 | Temp 98.3°F | Ht 68.0 in | Wt 228.0 lb

## 2017-01-24 DIAGNOSIS — J019 Acute sinusitis, unspecified: Secondary | ICD-10-CM

## 2017-01-24 DIAGNOSIS — B9689 Other specified bacterial agents as the cause of diseases classified elsewhere: Secondary | ICD-10-CM

## 2017-01-24 DIAGNOSIS — R109 Unspecified abdominal pain: Secondary | ICD-10-CM

## 2017-01-24 DIAGNOSIS — I255 Ischemic cardiomyopathy: Secondary | ICD-10-CM

## 2017-01-24 MED ORDER — DOXYCYCLINE HYCLATE 100 MG PO TABS: 100.0000 mg | ORAL_TABLET | Freq: Two times a day (BID) | ORAL | 0 refills | Status: DC

## 2017-01-24 NOTE — Progress Notes (Signed)
   Subjective:    Patient ID: Lance [name], male    DOB: 10-Jul-1945, 71 y.o.   MRN: [id number]  Sinusitis  This is a new problem. Episode onset: 3 days. (Headache, runny nose)   Runny nose nd cong and stuffiness  Pos headache dim enrgy/Headache frontal comes and goes achy at times worse with change of position  Cough off and on,  Felt rough over the weekend    Review of Systems No headache, no major weight loss or weight gain, no chest pain no back pain abdominal pain no change in bowel habits complete ROS otherwise negative     Objective:   Physical Exam  Alert, mild malaise. Hydration good Vitals stable. frontal/ maxillary tenderness evident positive nasal congestion. pharynx normal neck supple  lungs clear/no crackles or wheezes. heart regular in rhythm       Assessment & Plan:  Impression rhinosinusitis likely post viral, discussed with patient. plan antibiotics prescribed. Questions answered. Symptomatic care discussed. warning signs discussed. WSL

## 2017-01-24 NOTE — Telephone Encounter (Signed)
Labs entered for [facility]

## 2017-01-24 NOTE — Telephone Encounter (Signed)
Thanks

## 2017-01-24 NOTE — Progress Notes (Signed)
CC'D TO PCP °

## 2017-01-24 NOTE — Telephone Encounter (Signed)
Can you please put in an order for a I stat for the CT scan?

## 2017-01-27 ENCOUNTER — Encounter: Payer: Self-pay | Admitting: Adult Health

## 2017-01-27 ENCOUNTER — Ambulatory Visit (INDEPENDENT_AMBULATORY_CARE_PROVIDER_SITE_OTHER): Payer: Medicare Other | Admitting: Adult Health

## 2017-01-27 VITALS — BP 110/68 | HR 85 | Ht 69.0 in | Wt 230.6 lb

## 2017-01-27 DIAGNOSIS — I10 Essential (primary) hypertension: Secondary | ICD-10-CM | POA: Diagnosis not present

## 2017-01-27 DIAGNOSIS — I255 Ischemic cardiomyopathy: Secondary | ICD-10-CM

## 2017-01-27 DIAGNOSIS — I5032 Chronic diastolic (congestive) heart failure: Secondary | ICD-10-CM

## 2017-01-27 DIAGNOSIS — R0602 Shortness of breath: Secondary | ICD-10-CM

## 2017-01-27 MED ORDER — AMLODIPINE BESYLATE 2.5 MG PO TABS: 2.5000 mg | ORAL_TABLET | Freq: Every day | ORAL | 6 refills | Status: DC

## 2017-01-27 NOTE — Patient Instructions (Addendum)
Medication Instructions:  Your physician recommends that you continue on your current medications as directed. Please refer to the Current Medication list given to you today.  Decrease Norvasc to 2.5 mg Daily  Labwork: NONE   Testing/Procedures: NONE   Follow-Up: Your physician recommends that you schedule a follow-up appointment in: 1 Month    Any Other Special Instructions Will Be Listed Below (If Applicable).     If you need a refill on your cardiac medications before your next appointment, please call your pharmacy.  Thank you for choosing [facility]!
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
## 2017-01-28 DIAGNOSIS — R202 Paresthesia of skin: Secondary | ICD-10-CM | POA: Diagnosis not present

## 2017-01-28 DIAGNOSIS — M792 Neuralgia and neuritis, unspecified: Secondary | ICD-10-CM | POA: Diagnosis not present

## 2017-01-28 DIAGNOSIS — M545 Low back pain: Secondary | ICD-10-CM | POA: Diagnosis not present

## 2017-01-28 DIAGNOSIS — M5416 Radiculopathy, lumbar region: Secondary | ICD-10-CM | POA: Diagnosis not present

## 2017-01-31 DIAGNOSIS — R202 Paresthesia of skin: Secondary | ICD-10-CM | POA: Diagnosis not present

## 2017-01-31 DIAGNOSIS — M5416 Radiculopathy, lumbar region: Secondary | ICD-10-CM | POA: Diagnosis not present

## 2017-01-31 DIAGNOSIS — M792 Neuralgia and neuritis, unspecified: Secondary | ICD-10-CM | POA: Diagnosis not present

## 2017-01-31 DIAGNOSIS — M545 Low back pain: Secondary | ICD-10-CM | POA: Diagnosis not present

## 2017-02-01 ENCOUNTER — Other Ambulatory Visit: Payer: Self-pay | Admitting: Family Medicine

## 2017-02-01 LAB — CUP PACEART REMOTE DEVICE CHECK
Battery Remaining Longevity: 48 mo
Battery Remaining Percentage: 49 %
Battery Voltage: 2.93 V
Brady Statistic AP VP Percent: 1 %
Brady Statistic AP VS Percent: 1 %
Brady Statistic AS VP Percent: 1 %
Brady Statistic AS VS Percent: 99 %
Brady Statistic RA Percent Paced: 1 %
Brady Statistic RV Percent Paced: 1 %
Date Time Interrogation Session: 20180906143818
HighPow Impedance: 52 Ohm
HighPow Impedance: 63 Ohm
Implantable Lead Implant Date: 20050706
Implantable Lead Implant Date: 20161128
Implantable Lead Location: 753859
Implantable Lead Location: 753860
Implantable Lead Model: 7122
Implantable Pulse Generator Implant Date: 20130727
Lead Channel Impedance Value: 380 Ohm
Lead Channel Impedance Value: 410 Ohm
Lead Channel Pacing Threshold Amplitude: 0.75 V
Lead Channel Pacing Threshold Amplitude: 0.75 V
Lead Channel Pacing Threshold Pulse Width: 0.5 ms
Lead Channel Pacing Threshold Pulse Width: 0.5 ms
Lead Channel Sensing Intrinsic Amplitude: 11.3 mV
Lead Channel Sensing Intrinsic Amplitude: 2.5 mV
Lead Channel Setting Pacing Amplitude: 2 V
Lead Channel Setting Pacing Amplitude: 2.5 V
Lead Channel Setting Pacing Pulse Width: 0.5 ms
Lead Channel Setting Sensing Sensitivity: 0.5 mV
Pulse Gen Serial Number: 7041246

## 2017-02-01 NOTE — Telephone Encounter (Signed)
Last seen 01/18/17 for skin lesion

## 2017-02-01 NOTE — Telephone Encounter (Signed)
30 day supply 5 refills

## 2017-02-02 ENCOUNTER — Ambulatory Visit (HOSPITAL_COMMUNITY)
Admission: RE | Admit: 2017-02-02 | Discharge: 2017-02-02 | Disposition: A | Discharge status: Home / Self Care | Payer: Medicare Other | Source: Ambulatory Visit | Attending: Gastroenterology | Admitting: Gastroenterology

## 2017-02-02 DIAGNOSIS — K8020 Calculus of gallbladder without cholecystitis without obstruction: Secondary | ICD-10-CM | POA: Diagnosis not present

## 2017-02-02 DIAGNOSIS — N200 Calculus of kidney: Secondary | ICD-10-CM | POA: Insufficient documentation

## 2017-02-02 DIAGNOSIS — R911 Solitary pulmonary nodule: Secondary | ICD-10-CM | POA: Diagnosis not present

## 2017-02-02 DIAGNOSIS — R1012 Left upper quadrant pain: Secondary | ICD-10-CM | POA: Diagnosis not present

## 2017-02-02 DIAGNOSIS — I700 Atherosclerosis of aorta: Secondary | ICD-10-CM | POA: Insufficient documentation

## 2017-02-02 DIAGNOSIS — R6881 Early satiety: Secondary | ICD-10-CM | POA: Diagnosis not present

## 2017-02-02 DIAGNOSIS — N281 Cyst of kidney, acquired: Secondary | ICD-10-CM | POA: Insufficient documentation

## 2017-02-02 DIAGNOSIS — N400 Enlarged prostate without lower urinary tract symptoms: Secondary | ICD-10-CM | POA: Diagnosis not present

## 2017-02-02 DIAGNOSIS — R140 Abdominal distension (gaseous): Secondary | ICD-10-CM | POA: Diagnosis not present

## 2017-02-02 DIAGNOSIS — I77811 Abdominal aortic ectasia: Secondary | ICD-10-CM | POA: Diagnosis not present

## 2017-02-02 LAB — POCT I-STAT CREATININE: Creatinine, Ser: 1.1 mg/dL (ref 0.61–1.24)

## 2017-02-02 MED ORDER — IOPAMIDOL (ISOVUE-300) INJECTION 61%
100.0000 mL | Freq: Once | INTRAVENOUS | Status: AC | PRN
  Administered 2017-02-02: 100 mL via INTRAVENOUS

## 2017-02-07 DIAGNOSIS — M545 Low back pain: Secondary | ICD-10-CM | POA: Diagnosis not present

## 2017-02-07 DIAGNOSIS — R202 Paresthesia of skin: Secondary | ICD-10-CM | POA: Diagnosis not present

## 2017-02-07 DIAGNOSIS — M5416 Radiculopathy, lumbar region: Secondary | ICD-10-CM | POA: Diagnosis not present

## 2017-02-07 DIAGNOSIS — M792 Neuralgia and neuritis, unspecified: Secondary | ICD-10-CM | POA: Diagnosis not present

## 2017-02-09 DIAGNOSIS — M5416 Radiculopathy, lumbar region: Secondary | ICD-10-CM | POA: Diagnosis not present

## 2017-02-09 DIAGNOSIS — R202 Paresthesia of skin: Secondary | ICD-10-CM | POA: Diagnosis not present

## 2017-02-09 DIAGNOSIS — M545 Low back pain: Secondary | ICD-10-CM | POA: Diagnosis not present

## 2017-02-09 DIAGNOSIS — M792 Neuralgia and neuritis, unspecified: Secondary | ICD-10-CM | POA: Diagnosis not present

## 2017-02-10 ENCOUNTER — Other Ambulatory Visit: Payer: Self-pay | Admitting: Family Medicine

## 2017-02-11 MED ORDER — GABAPENTIN 300 MG PO CAPS: ORAL_CAPSULE | ORAL | 8 refills | Status: DC

## 2017-02-11 NOTE — Addendum Note (Signed)
Addended by: Karle Barr on: 02/11/2017 11:24 AM   Modules accepted: Orders

## 2017-02-15 ENCOUNTER — Telehealth: Payer: Self-pay | Admitting: Internal Medicine

## 2017-02-15 NOTE — Telephone Encounter (Signed)
[phone] patient never got results from his ct he had last month, please call him

## 2017-02-15 NOTE — Telephone Encounter (Signed)
Routing to [employer]

## 2017-02-16 ENCOUNTER — Other Ambulatory Visit: Payer: Self-pay

## 2017-02-16 ENCOUNTER — Encounter: Payer: Self-pay | Admitting: Internal Medicine

## 2017-02-16 DIAGNOSIS — R202 Paresthesia of skin: Secondary | ICD-10-CM | POA: Diagnosis not present

## 2017-02-16 DIAGNOSIS — M792 Neuralgia and neuritis, unspecified: Secondary | ICD-10-CM | POA: Diagnosis not present

## 2017-02-16 DIAGNOSIS — M545 Low back pain: Secondary | ICD-10-CM | POA: Diagnosis not present

## 2017-02-16 DIAGNOSIS — M5416 Radiculopathy, lumbar region: Secondary | ICD-10-CM | POA: Diagnosis not present

## 2017-02-16 MED ORDER — PANTOPRAZOLE SODIUM 40 MG PO TBEC: 40.0000 mg | DELAYED_RELEASE_TABLET | Freq: Two times a day (BID) | ORAL | 2 refills | Status: DC

## 2017-02-16 NOTE — Progress Notes (Signed)
Please let patient noted that a CT does not explain his early satiety, abdominal pain, gas.  He has gallstones, suspect asymptomatic. Patient describes left upper quadrant pain and office visit, typically with see right-sided abdominal pain with gallstones.He does have a small nonobstructing left kidney stone and stable left kidney cyst likely not causing any symptoms. -->New subpleural 4 mm solid right lower lobe pulmonary nodule, recommend noncontrast chest CT in 12 months, please NIC -->We'll recommend ultrasound of the aorta and 5 years for ectatic infrarenal abdominal aorta which is been stable but considered increased risk for development of aneurysm. Please NIC. -->As far as patient's symptoms, initially would like to increase his Protonix to 40 mg twice daily (30 minutes before BF and evening meal). Can send in rx for #60 and 2 refills. Be careful not to overeat. Consider eating 4-5 small meals daily instead of 3 large meals. Avoid greasy/fatty foods. -->Come back to see Dr. Gala Romney in 4 weeks.

## 2017-02-16 NOTE — Progress Notes (Signed)
PATIENT SCHEDULED  °
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
## 2017-02-21 DIAGNOSIS — M792 Neuralgia and neuritis, unspecified: Secondary | ICD-10-CM | POA: Diagnosis not present

## 2017-02-21 DIAGNOSIS — M5416 Radiculopathy, lumbar region: Secondary | ICD-10-CM | POA: Diagnosis not present

## 2017-02-21 DIAGNOSIS — M545 Low back pain: Secondary | ICD-10-CM | POA: Diagnosis not present

## 2017-02-21 DIAGNOSIS — R202 Paresthesia of skin: Secondary | ICD-10-CM | POA: Diagnosis not present

## 2017-02-21 NOTE — Progress Notes (Signed)
Stacey, please NIC for chest CT in 12 months AND aorta ultrasound in 5 years as outlined below. Thanks!

## 2017-02-22 NOTE — Progress Notes (Signed)
ON RECALL  °

## 2017-02-23 DIAGNOSIS — M5416 Radiculopathy, lumbar region: Secondary | ICD-10-CM | POA: Diagnosis not present

## 2017-02-23 DIAGNOSIS — M792 Neuralgia and neuritis, unspecified: Secondary | ICD-10-CM | POA: Diagnosis not present

## 2017-02-23 DIAGNOSIS — R202 Paresthesia of skin: Secondary | ICD-10-CM | POA: Diagnosis not present

## 2017-02-23 DIAGNOSIS — M545 Low back pain: Secondary | ICD-10-CM | POA: Diagnosis not present

## 2017-02-28 DIAGNOSIS — R202 Paresthesia of skin: Secondary | ICD-10-CM | POA: Diagnosis not present

## 2017-02-28 DIAGNOSIS — M545 Low back pain: Secondary | ICD-10-CM | POA: Diagnosis not present

## 2017-02-28 DIAGNOSIS — M792 Neuralgia and neuritis, unspecified: Secondary | ICD-10-CM | POA: Diagnosis not present

## 2017-02-28 DIAGNOSIS — M5416 Radiculopathy, lumbar region: Secondary | ICD-10-CM | POA: Diagnosis not present

## 2017-03-04 DIAGNOSIS — Z23 Encounter for immunization: Secondary | ICD-10-CM | POA: Diagnosis not present

## 2017-03-08 ENCOUNTER — Encounter: Payer: Self-pay | Admitting: Cardiology

## 2017-03-08 ENCOUNTER — Ambulatory Visit (INDEPENDENT_AMBULATORY_CARE_PROVIDER_SITE_OTHER): Payer: Medicare Other | Admitting: Cardiology

## 2017-03-08 ENCOUNTER — Other Ambulatory Visit (HOSPITAL_COMMUNITY)
Admission: RE | Admit: 2017-03-08 | Discharge: 2017-03-08 | Disposition: A | Discharge status: Home / Self Care | Payer: Medicare Other | Source: Ambulatory Visit | Attending: Cardiology | Admitting: Cardiology

## 2017-03-08 VITALS — BP 126/72 | HR 88 | Ht 69.0 in | Wt 233.0 lb

## 2017-03-08 DIAGNOSIS — I209 Angina pectoris, unspecified: Secondary | ICD-10-CM

## 2017-03-08 DIAGNOSIS — R300 Dysuria: Secondary | ICD-10-CM | POA: Diagnosis not present

## 2017-03-08 DIAGNOSIS — R0609 Other forms of dyspnea: Secondary | ICD-10-CM

## 2017-03-08 DIAGNOSIS — Z01818 Encounter for other preprocedural examination: Secondary | ICD-10-CM | POA: Diagnosis not present

## 2017-03-08 DIAGNOSIS — R3911 Hesitancy of micturition: Secondary | ICD-10-CM | POA: Diagnosis not present

## 2017-03-08 DIAGNOSIS — N401 Enlarged prostate with lower urinary tract symptoms: Secondary | ICD-10-CM | POA: Insufficient documentation

## 2017-03-08 DIAGNOSIS — I25119 Atherosclerotic heart disease of native coronary artery with unspecified angina pectoris: Secondary | ICD-10-CM | POA: Diagnosis not present

## 2017-03-08 DIAGNOSIS — I255 Ischemic cardiomyopathy: Secondary | ICD-10-CM

## 2017-03-08 DIAGNOSIS — R0600 Dyspnea, unspecified: Secondary | ICD-10-CM

## 2017-03-08 DIAGNOSIS — R350 Frequency of micturition: Secondary | ICD-10-CM | POA: Insufficient documentation

## 2017-03-08 LAB — BASIC METABOLIC PANEL
Anion gap: 10 (ref 5–15)
BUN: 13 mg/dL (ref 6–20)
CO2: 26 mmol/L (ref 22–32)
Calcium: 8.8 mg/dL — ABNORMAL LOW (ref 8.9–10.3)
Chloride: 99 mmol/L — ABNORMAL LOW (ref 101–111)
Creatinine, Ser: 1.09 mg/dL (ref 0.61–1.24)
GFR calc Af Amer: >60 mL/min (ref >60)
GFR calc non Af Amer: >60 mL/min (ref >60)
Glucose, Bld: 198 mg/dL — ABNORMAL HIGH (ref 65–99)
Potassium: 3.6 mmol/L (ref 3.5–5.1)
Sodium: 135 mmol/L (ref 135–145)

## 2017-03-08 LAB — CBC WITH DIFFERENTIAL/PLATELET
Basophils Absolute: 0 10*3/uL (ref 0.0–0.1)
Basophils Relative: 0 %
Eosinophils Absolute: 0.1 10*3/uL (ref 0.0–0.7)
Eosinophils Relative: 1 %
HCT: 39.8 % (ref 39.0–52.0)
Hemoglobin: 13.4 g/dL (ref 13.0–17.0)
Lymphocytes Relative: 25 %
Lymphs Abs: 2.1 10*3/uL (ref 0.7–4.0)
MCH: 27.9 pg (ref 26.0–34.0)
MCHC: 33.7 g/dL (ref 30.0–36.0)
MCV: 82.9 fL (ref 78.0–100.0)
Monocytes Absolute: 0.8 10*3/uL (ref 0.1–1.0)
Monocytes Relative: 9 %
Neutro Abs: 5.4 10*3/uL (ref 1.7–7.7)
Neutrophils Relative %: 65 %
Platelets: 155 10*3/uL (ref 150–400)
RBC: 4.8 MIL/uL (ref 4.22–5.81)
RDW: 15.8 % — ABNORMAL HIGH (ref 11.5–15.5)
WBC: 8.3 10*3/uL (ref 4.0–10.5)

## 2017-03-08 LAB — PROTIME-INR
INR: 0.94
Prothrombin Time: 12.5 seconds (ref 11.4–15.2)

## 2017-03-08 MED ORDER — FUROSEMIDE 20 MG PO TABS: 20.0000 mg | ORAL_TABLET | Freq: Every day | ORAL | 3 refills | Status: DC
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
## 2017-03-08 NOTE — Patient Instructions (Addendum)
Medication Instructions:  TAKE LASIX 20 MG DAILY AS NEEDED FOR SWELLING   Labwork: TODAY  CBC BMET INR  Testing/Procedures: Your physician has requested that you have a cardiac catheterization. Cardiac catheterization is used to diagnose and/or treat various heart conditions. Doctors may recommend this procedure for a number of different reasons. The most common reason is to evaluate chest pain. Chest pain can be a symptom of coronary artery disease (CAD), and cardiac catheterization can show whether plaque is narrowing or blocking your heart's arteries. This procedure is also used to evaluate the valves, as well as measure the blood flow and oxygen levels in different parts of your heart. For further information please visit [email or web address]. Please follow instruction sheet, as given.    Follow-Up: Your physician recommends that you schedule a follow-up appointment in: 1 MONTH    Any Other Special Instructions Will Be Listed Below (If Applicable).     If you need a refill on your cardiac medications before your next appointment, please call your pharmacy.   Lance [name] [place] [address] Dept: [phone] Loc: [phone]  Lance [name]  03/08/2017  You are scheduled for a Cardiac Catheterization on Friday, November 9 with Dr. Peter [name].  1. Please arrive at the [facility] (Main Entrance A) at [facility]: [address] [place], [place] [address] at 8:00 AM (two hours before your procedure to ensure your preparation). Free valet parking service is available.   Special note: Every effort is made to have your procedure done on time. Please understand that emergencies sometimes delay scheduled procedures.  2. Diet: Do not eat or drink anything after midnight prior to your procedure except sips of water to take medications.  3. Labs:TODAY @ [place].   4. Medication  instructions in preparation for your procedure:   TAKE 45 UNITS OF LEVEMIR @ 10 PM THE NIGHT BEFORE CATH, TAKE NO LEVEMIR THE MORNING OF CATH.   THE EVENING BEFORE YOUR CATH, ONLY TAKE 1/2 OF THE DOSE OF YOUR HUMALOG, DO NOT TAKE ANY THE MORNING OF.    On the morning of your procedure, take your Aspirin and any morning medicines NOT listed above.  You may use sips of water.  5. Plan for one night stay--bring personal belongings. 6. Bring a current list of your medications and current insurance cards. 7. You MUST have a responsible person to drive you home. 8. Someone MUST be with you the first 24 hours after you arrive home or your discharge will be delayed. 9. Please wear clothes that are easy to get on and off and wear slip-on shoes.  Thank you for allowing [place] to care for you!   -- [place]'

## 2017-03-09 ENCOUNTER — Other Ambulatory Visit: Payer: Self-pay | Admitting: Cardiology

## 2017-03-09 DIAGNOSIS — R0789 Other chest pain: Secondary | ICD-10-CM

## 2017-03-10 ENCOUNTER — Telehealth: Payer: Self-pay

## 2017-03-10 NOTE — Telephone Encounter (Signed)
Left message per DPR.  Patient contacted pre-catheterization at [facility] scheduled for:  03/11/2017 @ 1030 Verified arrival time and place:  NT @ 0800 Confirmed AM meds to be taken pre-cath with sip of water: Take ASA/Plavix  Night prior 1/2 amount levemir  AM of hold lasix, all insulins, and oral diabetic ([place])  Addl concerns:  Allergy to latex and tape-Notified cath lab  Left this nurse name and # for call back if any questions.

## 2017-03-11 ENCOUNTER — Encounter (HOSPITAL_COMMUNITY): Payer: Self-pay

## 2017-03-11 ENCOUNTER — Ambulatory Visit (HOSPITAL_COMMUNITY)
Admission: RE | Admit: 2017-03-11 | Discharge: 2017-03-11 | Disposition: A | Discharge status: Home / Self Care | Payer: Medicare Other | Source: Ambulatory Visit | Attending: Cardiology | Admitting: Cardiology

## 2017-03-11 ENCOUNTER — Encounter (HOSPITAL_COMMUNITY)
Admission: RE | Disposition: A | Discharge status: Home / Self Care | Payer: Self-pay | Source: Ambulatory Visit | Attending: Cardiology

## 2017-03-11 DIAGNOSIS — Z794 Long term (current) use of insulin: Secondary | ICD-10-CM | POA: Insufficient documentation

## 2017-03-11 DIAGNOSIS — Z880 Allergy status to penicillin: Secondary | ICD-10-CM | POA: Insufficient documentation

## 2017-03-11 DIAGNOSIS — I252 Old myocardial infarction: Secondary | ICD-10-CM | POA: Insufficient documentation

## 2017-03-11 DIAGNOSIS — M545 Low back pain: Secondary | ICD-10-CM | POA: Diagnosis not present

## 2017-03-11 DIAGNOSIS — Z79899 Other long term (current) drug therapy: Secondary | ICD-10-CM | POA: Diagnosis not present

## 2017-03-11 DIAGNOSIS — Z87891 Personal history of nicotine dependence: Secondary | ICD-10-CM | POA: Insufficient documentation

## 2017-03-11 DIAGNOSIS — Z955 Presence of coronary angioplasty implant and graft: Secondary | ICD-10-CM | POA: Diagnosis not present

## 2017-03-11 DIAGNOSIS — Z885 Allergy status to narcotic agent status: Secondary | ICD-10-CM | POA: Diagnosis not present

## 2017-03-11 DIAGNOSIS — Z7982 Long term (current) use of aspirin: Secondary | ICD-10-CM | POA: Insufficient documentation

## 2017-03-11 DIAGNOSIS — I509 Heart failure, unspecified: Secondary | ICD-10-CM | POA: Insufficient documentation

## 2017-03-11 DIAGNOSIS — R0789 Other chest pain: Secondary | ICD-10-CM

## 2017-03-11 DIAGNOSIS — I110 Hypertensive heart disease with heart failure: Secondary | ICD-10-CM | POA: Diagnosis not present

## 2017-03-11 DIAGNOSIS — Z7902 Long term (current) use of antithrombotics/antiplatelets: Secondary | ICD-10-CM | POA: Diagnosis not present

## 2017-03-11 DIAGNOSIS — E1159 Type 2 diabetes mellitus with other circulatory complications: Secondary | ICD-10-CM | POA: Diagnosis present

## 2017-03-11 DIAGNOSIS — Z8673 Personal history of transient ischemic attack (TIA), and cerebral infarction without residual deficits: Secondary | ICD-10-CM | POA: Diagnosis not present

## 2017-03-11 DIAGNOSIS — E1142 Type 2 diabetes mellitus with diabetic polyneuropathy: Secondary | ICD-10-CM | POA: Insufficient documentation

## 2017-03-11 DIAGNOSIS — E785 Hyperlipidemia, unspecified: Secondary | ICD-10-CM | POA: Diagnosis present

## 2017-03-11 DIAGNOSIS — I25810 Atherosclerosis of coronary artery bypass graft(s) without angina pectoris: Secondary | ICD-10-CM | POA: Diagnosis not present

## 2017-03-11 DIAGNOSIS — R0609 Other forms of dyspnea: Secondary | ICD-10-CM | POA: Diagnosis not present

## 2017-03-11 DIAGNOSIS — G8929 Other chronic pain: Secondary | ICD-10-CM | POA: Diagnosis not present

## 2017-03-11 DIAGNOSIS — I2510 Atherosclerotic heart disease of native coronary artery without angina pectoris: Secondary | ICD-10-CM | POA: Diagnosis not present

## 2017-03-11 DIAGNOSIS — Z96652 Presence of left artificial knee joint: Secondary | ICD-10-CM | POA: Diagnosis not present

## 2017-03-11 DIAGNOSIS — I255 Ischemic cardiomyopathy: Secondary | ICD-10-CM | POA: Diagnosis present

## 2017-03-11 DIAGNOSIS — Z95810 Presence of automatic (implantable) cardiac defibrillator: Secondary | ICD-10-CM | POA: Diagnosis not present

## 2017-03-11 DIAGNOSIS — R0600 Dyspnea, unspecified: Secondary | ICD-10-CM | POA: Diagnosis present

## 2017-03-11 DIAGNOSIS — N400 Enlarged prostate without lower urinary tract symptoms: Secondary | ICD-10-CM | POA: Diagnosis not present

## 2017-03-11 DIAGNOSIS — Z7983 Long term (current) use of bisphosphonates: Secondary | ICD-10-CM | POA: Diagnosis not present

## 2017-03-11 DIAGNOSIS — J449 Chronic obstructive pulmonary disease, unspecified: Secondary | ICD-10-CM | POA: Insufficient documentation

## 2017-03-11 DIAGNOSIS — I10 Essential (primary) hypertension: Secondary | ICD-10-CM | POA: Diagnosis present

## 2017-03-11 DIAGNOSIS — Z951 Presence of aortocoronary bypass graft: Secondary | ICD-10-CM

## 2017-03-11 HISTORY — PX: LEFT HEART CATH AND CORS/GRAFTS ANGIOGRAPHY: CATH118250

## 2017-03-11 LAB — GLUCOSE, CAPILLARY
Glucose-Capillary: 161 mg/dL — ABNORMAL HIGH (ref 65–99)
Glucose-Capillary: 80 mg/dL (ref 65–99)
Glucose-Capillary: 81 mg/dL (ref 65–99)

## 2017-03-11 SURGERY — LEFT HEART CATH AND CORS/GRAFTS ANGIOGRAPHY
Anesthesia: LOCAL

## 2017-03-11 MED ORDER — LIDOCAINE HCL (PF) 1 % IJ SOLN
INTRAMUSCULAR | Status: DC | PRN
  Administered 2017-03-11: 20 mL

## 2017-03-11 MED ORDER — HEPARIN (PORCINE) IN NACL 2-0.9 UNIT/ML-% IJ SOLN
INTRAMUSCULAR | Status: AC
  Filled 2017-03-11: qty 1000

## 2017-03-11 MED ORDER — MIDAZOLAM HCL 2 MG/2ML IJ SOLN
INTRAMUSCULAR | Status: DC | PRN
Start: 2017-03-11 — End: 2017-03-11
  Administered 2017-03-11: 1 mg via INTRAVENOUS

## 2017-03-11 MED ORDER — IOPAMIDOL (ISOVUE-370) INJECTION 76%
INTRAVENOUS | Status: AC
  Filled 2017-03-11: qty 125

## 2017-03-11 MED ORDER — FENTANYL CITRATE (PF) 100 MCG/2ML IJ SOLN
INTRAMUSCULAR | Status: DC | PRN
  Administered 2017-03-11: 25 ug via INTRAVENOUS

## 2017-03-11 MED ORDER — IOPAMIDOL (ISOVUE-370) INJECTION 76%
INTRAVENOUS | Status: DC | PRN
  Administered 2017-03-11: 65 mL via INTRA_ARTERIAL

## 2017-03-11 MED ORDER — SODIUM CHLORIDE 0.9 % WEIGHT BASED INFUSION: 1.0000 mL/kg/h | INTRAVENOUS | Status: DC

## 2017-03-11 MED ORDER — CLOPIDOGREL BISULFATE 75 MG PO TABS
ORAL_TABLET | ORAL | Status: AC
  Administered 2017-03-11: 75 mg via ORAL
  Filled 2017-03-11: qty 1

## 2017-03-11 MED ORDER — CLOPIDOGREL BISULFATE 75 MG PO TABS
75.0000 mg | ORAL_TABLET | Freq: Once | ORAL | Status: AC
  Administered 2017-03-11: 75 mg via ORAL

## 2017-03-11 MED ORDER — SODIUM CHLORIDE 0.9% FLUSH: 3.0000 mL | Freq: Two times a day (BID) | INTRAVENOUS | Status: DC

## 2017-03-11 MED ORDER — MIDAZOLAM HCL 2 MG/2ML IJ SOLN
INTRAMUSCULAR | Status: AC
  Filled 2017-03-11: qty 2

## 2017-03-11 MED ORDER — SODIUM CHLORIDE 0.9 % IV SOLN: 250.0000 mL | INTRAVENOUS | Status: DC | PRN

## 2017-03-11 MED ORDER — ASPIRIN 81 MG PO CHEW: 81.0000 mg | CHEWABLE_TABLET | ORAL | Status: DC

## 2017-03-11 MED ORDER — SODIUM CHLORIDE 0.9 % WEIGHT BASED INFUSION
3.0000 mL/kg/h | INTRAVENOUS | Status: AC
  Administered 2017-03-11: 3 mL/kg/h via INTRAVENOUS

## 2017-03-11 MED ORDER — SODIUM CHLORIDE 0.9% FLUSH: 3.0000 mL | INTRAVENOUS | Status: DC | PRN

## 2017-03-11 MED ORDER — LIDOCAINE HCL (PF) 1 % IJ SOLN
INTRAMUSCULAR | Status: AC
  Filled 2017-03-11: qty 30

## 2017-03-11 MED ORDER — HEPARIN (PORCINE) IN NACL 2-0.9 UNIT/ML-% IJ SOLN
INTRAMUSCULAR | Status: AC | PRN
  Administered 2017-03-11: 1000 mL via INTRA_ARTERIAL

## 2017-03-11 MED ORDER — FENTANYL CITRATE (PF) 100 MCG/2ML IJ SOLN
INTRAMUSCULAR | Status: AC
  Filled 2017-03-11: qty 2

## 2017-03-11 MED ORDER — SODIUM CHLORIDE 0.9 % WEIGHT BASED INFUSION: 1.0000 mL/kg/h | INTRAVENOUS | Status: AC

## 2017-03-11 SURGICAL SUPPLY — 12 items
CATH INFINITI 5 FR IM (CATHETERS) ×1 IMPLANT
CATH INFINITI 5FR AL1 (CATHETERS) ×1 IMPLANT
CATH INFINITI 5FR MULTPACK ANG (CATHETERS) ×1 IMPLANT
CATH INFINITI JR4 5F (CATHETERS) ×1 IMPLANT
KIT HEART LEFT (KITS) ×2 IMPLANT
PACK CARDIAC CATHETERIZATION (CUSTOM PROCEDURE TRAY) ×2 IMPLANT
SHEATH PINNACLE 5F 10CM (SHEATH) ×1 IMPLANT
SYR MEDRAD MARK V 150ML (SYRINGE) ×2 IMPLANT
TRANSDUCER W/STOPCOCK (MISCELLANEOUS) ×2 IMPLANT
TUBING CIL FLEX 10 FLL-RA (TUBING) ×2 IMPLANT
WIRE EMERALD 3MM-J .035X150CM (WIRE) ×1 IMPLANT
WIRE HI TORQ VERSACORE-J 145CM (WIRE) ×1 IMPLANT

## 2017-03-11 NOTE — Interval H&P Note (Signed)
History and Physical Interval Note:  03/11/2017 10:01 AM  Lance [name]  has presented today for surgery, with the diagnosis of cp  The various methods of treatment have been discussed with the patient and family. After consideration of risks, benefits and other options for treatment, the patient has consented to  Procedure(s): LEFT HEART CATH AND CORS/GRAFTS ANGIOGRAPHY (N/A) as a surgical intervention .  The patient's history has been reviewed, patient examined, no change in status, stable for surgery.  I have reviewed the patient's chart and labs.  Questions were answered to the patient's satisfaction.   Cath Lab Visit (complete for each Cath Lab visit)  Clinical Evaluation Leading to the Procedure:   ACS: No.  Non-ACS:    Anginal Classification: CCS III  Anti-ischemic medical therapy: Maximal Therapy (2 or more classes of medications)  Non-Invasive Test Results: No non-invasive testing performed  Prior CABG: Previous CABG        Lance [name] 03/11/2017 10:02 AM

## 2017-03-11 NOTE — Progress Notes (Signed)
Right femoral sheath removed and pressure held for 20 minutes. Groin level zero. Right distal pulse palpable pedal. Down time begins at 11:20 for 4 hours. No apparent complications.

## 2017-03-11 NOTE — Discharge Instructions (Signed)

## 2017-03-14 ENCOUNTER — Telehealth: Payer: Self-pay | Admitting: *Deleted

## 2017-03-14 ENCOUNTER — Encounter (HOSPITAL_COMMUNITY): Payer: Self-pay | Admitting: Cardiology

## 2017-03-14 NOTE — Telephone Encounter (Signed)
Pt stopped by office today and states that he was told to hold Lasix d/t feeling dizzy. Pt reports that he has held Lasix and is still feeling dizzy. Please advise.

## 2017-03-15 ENCOUNTER — Telehealth: Payer: Self-pay

## 2017-03-15 NOTE — Telephone Encounter (Signed)
Can he come by for a nursing visit for orthostatics please   Zandra Abts MD

## 2017-03-15 NOTE — Telephone Encounter (Signed)
Does the dizziness happen when laying down, sitting or standing up? Does he feel lightheaded specifically, or more like the room is spinning?   Carlyle Dolly MD

## 2017-03-15 NOTE — Telephone Encounter (Signed)
Pt. Came in for orthostatics- lying his BP was 124/78  HR 77, sitting BP was 116/68 HR 84, standing BP was 108/64 HR 85 & standing after 3 min BP 118/74 HR 74. Sent as FYI.

## 2017-03-15 NOTE — Telephone Encounter (Signed)
Called patient, he stated that he gets the "most dizzy" when he first stands up. He states that it may last for 5-10 minutes or it could last all day on and off. He stated that it feels more like a lightheadedness verses the room spinning sensation. He feels as if he may pass out at times. Please advise.

## 2017-03-15 NOTE — Telephone Encounter (Signed)
Orthostatics are abnormal, possibly the cause of his symptoms. Can he stop his norvasc for the next few days and update [place] on symptoms on Friday  J Shammond Arave MD

## 2017-03-15 NOTE — Telephone Encounter (Signed)
Called pt. Advised him to hold his norvasc for a few days and to please call back Friday to update [place] on his symptoms. He voiced understanding.

## 2017-03-15 NOTE — Telephone Encounter (Signed)
Spoke with patient, he states he will come in this afternoon.

## 2017-03-18 ENCOUNTER — Encounter: Payer: Self-pay | Admitting: Internal Medicine

## 2017-03-18 ENCOUNTER — Ambulatory Visit (INDEPENDENT_AMBULATORY_CARE_PROVIDER_SITE_OTHER): Payer: Medicare Other | Admitting: Internal Medicine

## 2017-03-18 VITALS — BP 133/80 | HR 97 | Temp 97.1°F | Ht 69.0 in | Wt 234.8 lb

## 2017-03-18 DIAGNOSIS — K9289 Other specified diseases of the digestive system: Secondary | ICD-10-CM

## 2017-03-18 DIAGNOSIS — K219 Gastro-esophageal reflux disease without esophagitis: Secondary | ICD-10-CM | POA: Diagnosis not present

## 2017-03-18 DIAGNOSIS — I255 Ischemic cardiomyopathy: Secondary | ICD-10-CM

## 2017-03-18 NOTE — Patient Instructions (Addendum)
Continue Protonix 40 mg twice daily  IBguard 2 tablets twice daily as needed  Would still try Gas-X on occasion  Office visit in 4 months  Chest CT in 1 year
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
## 2017-03-22 DIAGNOSIS — S92515A Nondisplaced fracture of proximal phalanx of left lesser toe(s), initial encounter for closed fracture: Secondary | ICD-10-CM | POA: Diagnosis not present

## 2017-03-22 DIAGNOSIS — B351 Tinea unguium: Secondary | ICD-10-CM | POA: Diagnosis not present

## 2017-03-22 DIAGNOSIS — E1142 Type 2 diabetes mellitus with diabetic polyneuropathy: Secondary | ICD-10-CM | POA: Diagnosis not present

## 2017-03-29 DIAGNOSIS — Z125 Encounter for screening for malignant neoplasm of prostate: Secondary | ICD-10-CM | POA: Diagnosis not present

## 2017-03-29 DIAGNOSIS — I2510 Atherosclerotic heart disease of native coronary artery without angina pectoris: Secondary | ICD-10-CM | POA: Diagnosis not present

## 2017-03-29 DIAGNOSIS — Z794 Long term (current) use of insulin: Secondary | ICD-10-CM | POA: Diagnosis not present

## 2017-03-29 DIAGNOSIS — Z5181 Encounter for therapeutic drug level monitoring: Secondary | ICD-10-CM | POA: Diagnosis not present

## 2017-03-29 DIAGNOSIS — E1142 Type 2 diabetes mellitus with diabetic polyneuropathy: Secondary | ICD-10-CM | POA: Diagnosis not present

## 2017-03-29 DIAGNOSIS — E230 Hypopituitarism: Secondary | ICD-10-CM | POA: Diagnosis not present
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
## 2017-04-07 ENCOUNTER — Encounter: Payer: Self-pay | Admitting: Adult Health

## 2017-04-07 ENCOUNTER — Ambulatory Visit (INDEPENDENT_AMBULATORY_CARE_PROVIDER_SITE_OTHER): Payer: Medicare Other | Admitting: *Deleted

## 2017-04-07 ENCOUNTER — Ambulatory Visit: Payer: Medicare Other | Admitting: Adult Health

## 2017-04-07 DIAGNOSIS — I255 Ischemic cardiomyopathy: Secondary | ICD-10-CM

## 2017-04-07 NOTE — Progress Notes (Signed)
Remote ICD transmission.   

## 2017-04-14 ENCOUNTER — Encounter: Payer: Self-pay | Admitting: Nurse Practitioner

## 2017-04-14 ENCOUNTER — Ambulatory Visit (INDEPENDENT_AMBULATORY_CARE_PROVIDER_SITE_OTHER): Payer: Medicare Other | Admitting: Nurse Practitioner

## 2017-04-14 VITALS — BP 126/74 | Temp 99.2°F | Ht 69.0 in | Wt 230.0 lb

## 2017-04-14 DIAGNOSIS — J069 Acute upper respiratory infection, unspecified: Secondary | ICD-10-CM | POA: Diagnosis not present

## 2017-04-14 DIAGNOSIS — R197 Diarrhea, unspecified: Secondary | ICD-10-CM

## 2017-04-14 DIAGNOSIS — I255 Ischemic cardiomyopathy: Secondary | ICD-10-CM

## 2017-04-14 MED ORDER — CEFDINIR 300 MG PO CAPS: 300.0000 mg | ORAL_CAPSULE | Freq: Two times a day (BID) | ORAL | 0 refills | Status: DC

## 2017-04-14 MED ORDER — HYDROCODONE-HOMATROPINE 5-1.5 MG/5ML PO SYRP: 5.0000 mL | ORAL_SOLUTION | ORAL | 0 refills | Status: DC | PRN

## 2017-04-15 ENCOUNTER — Encounter: Payer: Self-pay | Admitting: Cardiology

## 2017-04-15 LAB — CUP PACEART REMOTE DEVICE CHECK
Battery Remaining Longevity: 46 mo
Battery Remaining Percentage: 46 %
Battery Voltage: 2.93 V
Brady Statistic AP VP Percent: 1 %
Brady Statistic AP VS Percent: 1 %
Brady Statistic AS VP Percent: 1 %
Brady Statistic AS VS Percent: 99 %
Brady Statistic RA Percent Paced: 1 %
Brady Statistic RV Percent Paced: 1 %
Date Time Interrogation Session: 20181206090018
HighPow Impedance: 55 Ohm
HighPow Impedance: 55 Ohm
Implantable Lead Implant Date: 20050706
Implantable Lead Implant Date: 20161128
Implantable Lead Location: 753859
Implantable Lead Location: 753860
Implantable Lead Model: 7122
Implantable Pulse Generator Implant Date: 20130727
Lead Channel Impedance Value: 390 Ohm
Lead Channel Impedance Value: 410 Ohm
Lead Channel Pacing Threshold Amplitude: 0.75 V
Lead Channel Pacing Threshold Amplitude: 0.75 V
Lead Channel Pacing Threshold Pulse Width: 0.5 ms
Lead Channel Pacing Threshold Pulse Width: 0.5 ms
Lead Channel Sensing Intrinsic Amplitude: 11.3 mV
Lead Channel Sensing Intrinsic Amplitude: 2 mV
Lead Channel Setting Pacing Amplitude: 2 V
Lead Channel Setting Pacing Amplitude: 2.5 V
Lead Channel Setting Pacing Pulse Width: 0.5 ms
Lead Channel Setting Sensing Sensitivity: 0.5 mV
Pulse Gen Serial Number: 7041246

## 2017-04-16 ENCOUNTER — Encounter: Payer: Self-pay | Admitting: Nurse Practitioner

## 2017-04-16 NOTE — Progress Notes (Signed)
Subjective: Patient presents for complaints of cough and congestion for the past 2 days.  Low-grade fever.  Sore throat.  Left-sided sinus pressure with white drainage.  Frequent cough producing yellow mucus.  Right ear pain.  Slight wheeze at times, has not used his albuterol.  No headache.  No nausea vomiting.  Began having 3-4 episodes of diarrhea each day starting yesterday.  No bloody stools.  No abdominal pain.  Taking fluids well.  Voiding normal limit.  No known contacts.  No shortness of breath.  Regular follow-up with cardiology.  Objective:   BP 126/74   Temp 99.2 F (37.3 C) (Oral)   Ht 5\' 9"  (1.753 m)   Wt 230 lb (104.3 kg)   BMI 33.97 kg/m  NAD.  Alert, oriented.  TMs clear effusion, no erythema.  Pharynx injected with PND noted.  Neck supple with mild soft anterior adenopathy.  Lungs clear.  No wheezing or tachypnea.  Heart regular rate and rhythm.  Abdomen soft obese nondistended with active bowel sounds; minimal tenderness towards the left lower quadrant.  No rebound or guarding.  No obvious masses.  Assessment:  Acute upper respiratory infection  Diarrhea, unspecified type    Plan:   Meds ordered this encounter  Medications  . cefdinir (OMNICEF) 300 MG capsule    Sig: Take 1 capsule (300 mg total) by mouth 2 (two) times daily.    Dispense:  20 capsule    Refill:  0    Order Specific Question:   Supervising Provider    Answer:   Mikey Kirschner [[id number]]  . HYDROcodone-homatropine (HYCODAN) 5-1.5 MG/5ML syrup    Sig: Take 5 mLs by mouth every 4 (four) hours as needed.    Dispense:  90 mL    Refill:  0    Order Specific Question:   Supervising Provider    Answer:   Mikey Kirschner [[id number]]   Continue OTC meds as directed for congestion and cough.  Reviewed dietary measures and warning signs for diarrhea.  Call back in 3-4 days if no improvement, sooner if worse.

## 2017-04-28 ENCOUNTER — Other Ambulatory Visit: Payer: Self-pay | Admitting: Family Medicine

## 2017-06-06 ENCOUNTER — Other Ambulatory Visit: Payer: Self-pay | Admitting: Family Medicine

## 2017-06-16 ENCOUNTER — Ambulatory Visit (INDEPENDENT_AMBULATORY_CARE_PROVIDER_SITE_OTHER): Payer: Medicare Other | Admitting: Family Medicine

## 2017-06-16 ENCOUNTER — Encounter: Payer: Self-pay | Admitting: Family Medicine

## 2017-06-16 VITALS — BP 134/72 | Temp 100.2°F | Ht 69.0 in | Wt 232.4 lb

## 2017-06-16 DIAGNOSIS — J111 Influenza due to unidentified influenza virus with other respiratory manifestations: Secondary | ICD-10-CM | POA: Diagnosis not present

## 2017-06-16 MED ORDER — HYDROCODONE-HOMATROPINE 5-1.5 MG/5ML PO SYRP: 5.0000 mL | ORAL_SOLUTION | Freq: Four times a day (QID) | ORAL | 0 refills | Status: DC | PRN

## 2017-06-16 MED ORDER — OSELTAMIVIR PHOSPHATE 75 MG PO CAPS: 75.0000 mg | ORAL_CAPSULE | Freq: Two times a day (BID) | ORAL | 0 refills | Status: AC

## 2017-06-16 NOTE — Progress Notes (Signed)
   Subjective:    Patient ID: Lance [name], male    DOB: 13-Dec-1945, 72 y.o.   MRN: [id number]  Sinusitis  The current episode started in the past 7 days. Associated symptoms include headaches, shortness of breath and a sore throat. (Runny nose) Past treatments include nothing.  Pt states he just started running a fever and is having some trouble breathing.  Pos exposure to folks sick last week  Bad headache   Low gr temp   Dim energy   Wheezing worse    Got a flu shot    Review of Systems  HENT: Positive for sore throat.   Respiratory: Positive for shortness of breath.   Neurological: Positive for headaches.       Objective:   Physical Exam Alert vitals reviewed, moderate malaise. Hydration good. Positive nasal congestion lungs no crackles or wheezes, no tachypnea, intermittent bronchial cough during exam heart regular rate and rhythm.        Assessment & Plan:  Impression influenza discussed at length. Petra Kuba of illness and potential sequela discussed. Plan Tamiflu prescribed if indicated and timing appropriate. Symptom care discussed. Warning signs discussed. WSL

## 2017-06-18 ENCOUNTER — Other Ambulatory Visit: Payer: Self-pay | Admitting: Gastroenterology

## 2017-06-28 DIAGNOSIS — E1142 Type 2 diabetes mellitus with diabetic polyneuropathy: Secondary | ICD-10-CM | POA: Diagnosis not present

## 2017-06-28 DIAGNOSIS — B351 Tinea unguium: Secondary | ICD-10-CM | POA: Diagnosis not present

## 2017-06-30 DIAGNOSIS — E230 Hypopituitarism: Secondary | ICD-10-CM | POA: Diagnosis not present

## 2017-06-30 DIAGNOSIS — E1142 Type 2 diabetes mellitus with diabetic polyneuropathy: Secondary | ICD-10-CM | POA: Diagnosis not present

## 2017-06-30 DIAGNOSIS — I2510 Atherosclerotic heart disease of native coronary artery without angina pectoris: Secondary | ICD-10-CM | POA: Diagnosis not present

## 2017-06-30 DIAGNOSIS — Z5181 Encounter for therapeutic drug level monitoring: Secondary | ICD-10-CM | POA: Diagnosis not present

## 2017-06-30 DIAGNOSIS — Z794 Long term (current) use of insulin: Secondary | ICD-10-CM | POA: Diagnosis not present

## 2017-06-30 DIAGNOSIS — Z125 Encounter for screening for malignant neoplasm of prostate: Secondary | ICD-10-CM | POA: Diagnosis not present

## 2017-07-01 ENCOUNTER — Ambulatory Visit (INDEPENDENT_AMBULATORY_CARE_PROVIDER_SITE_OTHER): Payer: Medicare Other | Admitting: Cardiology

## 2017-07-01 ENCOUNTER — Encounter: Payer: Self-pay | Admitting: Cardiology

## 2017-07-01 VITALS — BP 138/78 | HR 72 | Ht 69.0 in | Wt 229.8 lb

## 2017-07-01 DIAGNOSIS — I2510 Atherosclerotic heart disease of native coronary artery without angina pectoris: Secondary | ICD-10-CM | POA: Diagnosis not present

## 2017-07-01 DIAGNOSIS — E782 Mixed hyperlipidemia: Secondary | ICD-10-CM | POA: Diagnosis not present

## 2017-07-01 DIAGNOSIS — I5022 Chronic systolic (congestive) heart failure: Secondary | ICD-10-CM | POA: Diagnosis not present

## 2017-07-01 DIAGNOSIS — I10 Essential (primary) hypertension: Secondary | ICD-10-CM | POA: Diagnosis not present

## 2017-07-01 NOTE — Patient Instructions (Signed)
Medication Instructions:  STOP NORVASC   Labwork: I WILL REQUEST LABS FROM DR. KERR  Testing/Procedures: NONE  Follow-Up: Your physician recommends that you schedule a follow-up appointment in: 4 MONTHS    Any Other Special Instructions Will Be Listed Below (If Applicable).     If you need a refill on your cardiac medications before your next appointment, please call your pharmacy.
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
## 2017-07-06 ENCOUNTER — Encounter: Payer: Self-pay | Admitting: Adult Health

## 2017-07-06 ENCOUNTER — Ambulatory Visit (INDEPENDENT_AMBULATORY_CARE_PROVIDER_SITE_OTHER): Payer: Medicare Other | Admitting: Adult Health

## 2017-07-06 VITALS — BP 106/58 | HR 76 | Wt 233.0 lb

## 2017-07-06 DIAGNOSIS — R413 Other amnesia: Secondary | ICD-10-CM

## 2017-07-06 DIAGNOSIS — F329 Major depressive disorder, single episode, unspecified: Secondary | ICD-10-CM

## 2017-07-06 DIAGNOSIS — F32A Depression, unspecified: Secondary | ICD-10-CM

## 2017-07-06 NOTE — Patient Instructions (Signed)
Your Plan:  Continue Aricept Memory score is stable Follow up with PCP about depression If your symptoms worsen or you develop new symptoms please let us know.    Thank you for coming to see [place] at [facility]. I hope we have been able to provide you high quality care today.  You may receive a patient satisfaction survey over the next few weeks. We would appreciate your feedback and comments so that we may continue to improve ourselves and the health of our patients.
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
## 2017-07-06 NOTE — Progress Notes (Signed)
I have read the note, and I agree with the clinical assessment and plan.  Charles K Willis   

## 2017-07-07 ENCOUNTER — Ambulatory Visit (INDEPENDENT_AMBULATORY_CARE_PROVIDER_SITE_OTHER): Payer: Medicare Other | Admitting: *Deleted

## 2017-07-07 ENCOUNTER — Ambulatory Visit (INDEPENDENT_AMBULATORY_CARE_PROVIDER_SITE_OTHER): Payer: Medicare Other | Admitting: Family Medicine

## 2017-07-07 ENCOUNTER — Encounter: Payer: Self-pay | Admitting: Family Medicine

## 2017-07-07 ENCOUNTER — Encounter: Payer: Self-pay | Admitting: Cardiology

## 2017-07-07 VITALS — BP 120/70 | Ht 69.0 in | Wt 233.0 lb

## 2017-07-07 DIAGNOSIS — I255 Ischemic cardiomyopathy: Secondary | ICD-10-CM | POA: Diagnosis not present

## 2017-07-07 DIAGNOSIS — F322 Major depressive disorder, single episode, severe without psychotic features: Secondary | ICD-10-CM | POA: Diagnosis not present

## 2017-07-07 MED ORDER — MIRTAZAPINE 7.5 MG PO TABS: 7.5000 mg | ORAL_TABLET | Freq: Every day | ORAL | 5 refills | Status: DC

## 2017-07-07 NOTE — Progress Notes (Signed)
Remote ICD transmission.   

## 2017-07-07 NOTE — Progress Notes (Signed)
   Subjective:    Patient ID: Lance [name], male    DOB: 10/20/1945, 71 y.o.   MRN: [id number]  HPI Patient is here today to follow up on depression. He states he takes 1/2 of a xanax 1 mg daily, he also takes Cymbalta 60 mg one daily. The patient presents today for discussion regarding depression he is found himself feeling down and blue he is found himself not desiring to do much in addition to this he finds himself feeling irritated and angry upon further discussion a lot of this hinges around the fact his wife is getting dementia and has she is become more demented she is having more forgetfulness and also puts more stress on him plus she argues a lot with him he is not suicidal not homicidal.  He still works he views this as a release from what he normally does. Depression screen [facility] 2/9 07/07/2017 02/26/2016 11/25/2015 08/25/2015 07/04/2015  Decreased Interest 3 3 0 0 0  Down, Depressed, Hopeless 3 3 0 0 0  PHQ - 2 Score 6 6 0 0 0  Altered sleeping 3 0 - - -  Tired, decreased energy 3 3 - - -  Change in appetite 1 0 - - -  Feeling bad or failure about yourself  2 0 - - -  Trouble concentrating 3 0 - - -  Moving slowly or fidgety/restless 2 0 - - -  Suicidal thoughts 0 - - - -  PHQ-9 Score 20 9 - - -  Difficult doing work/chores Very difficult Not difficult at all - - -  Some recent data might be hidden     Review of Systems     Objective:   Physical Exam 20 minutes spent with patient all of this was spent discussing and answering questions regarding his depression.  There was no physical exam necessary for this exam today.       Assessment & Plan:  Major depression 20-25 minutes spent with patient discussing his depression discussing treatment options patient does not want counseling he states he is not suicidal or homicidal Patient does agree to trying Remeron 7.5 mg nightly stop trazodone.  If patient gets worse he needs a follow-up sooner.

## 2017-07-08 ENCOUNTER — Encounter: Payer: Self-pay | Admitting: Cardiology

## 2017-07-11 DIAGNOSIS — Z794 Long term (current) use of insulin: Secondary | ICD-10-CM | POA: Diagnosis not present

## 2017-07-11 DIAGNOSIS — E119 Type 2 diabetes mellitus without complications: Secondary | ICD-10-CM | POA: Diagnosis not present

## 2017-07-17 LAB — CUP PACEART REMOTE DEVICE CHECK
Date Time Interrogation Session: 20190317175141
Implantable Lead Implant Date: 20050706
Implantable Lead Implant Date: 20161128
Implantable Lead Location: 753859
Implantable Lead Location: 753860
Implantable Lead Model: 7122
Implantable Pulse Generator Implant Date: 20130727
Pulse Gen Serial Number: 7041246

## 2017-07-28 ENCOUNTER — Ambulatory Visit (INDEPENDENT_AMBULATORY_CARE_PROVIDER_SITE_OTHER): Payer: Medicare Other | Admitting: Family Medicine

## 2017-07-28 ENCOUNTER — Encounter: Payer: Self-pay | Admitting: Family Medicine

## 2017-07-28 VITALS — BP 134/86 | Ht 69.0 in | Wt 228.0 lb

## 2017-07-28 DIAGNOSIS — R42 Dizziness and giddiness: Secondary | ICD-10-CM

## 2017-07-28 DIAGNOSIS — I255 Ischemic cardiomyopathy: Secondary | ICD-10-CM | POA: Diagnosis not present

## 2017-07-28 DIAGNOSIS — F322 Major depressive disorder, single episode, severe without psychotic features: Secondary | ICD-10-CM | POA: Diagnosis not present

## 2017-07-28 NOTE — Progress Notes (Signed)
   Subjective:    Patient ID: Lance [name], male    DOB: [birth year], 72 y.o.   MRN: [id number]  HPIFollow up on depression. Pt states he is some better since changing med. Pt states he filled out phq9 3 weeks ago.  Depression seems to be doing better with the medication to some degree  Pt wants to discuss all his meds. He states he has been dizzy and not feeling well so he forgot to take morning meds one day and he felt great. So he stopped taking for 3 days. No dizzines and felt good. Took them on the fourth day and the dizziness came back.       Review of Systems  Constitutional: Negative for activity change, fatigue and fever.  HENT: Negative for congestion and rhinorrhea.   Respiratory: Negative for cough and shortness of breath.   Cardiovascular: Negative for chest pain and leg swelling.  Gastrointestinal: Negative for abdominal pain, diarrhea and nausea.  Genitourinary: Negative for dysuria and hematuria.  Neurological: Negative for weakness and headaches.  Psychiatric/Behavioral: Negative for behavioral problems.       Objective:   Physical Exam  Constitutional: He appears well-nourished. No distress.  HENT:  Head: Normocephalic and atraumatic.  Eyes: Right eye exhibits no discharge. Left eye exhibits no discharge.  Neck: No tracheal deviation present.  Cardiovascular: Normal rate, regular rhythm and normal heart sounds.  No murmur heard. Pulmonary/Chest: Effort normal and breath sounds normal. No respiratory distress. He has no wheezes.  Musculoskeletal: He exhibits no edema.  Lymphadenopathy:    He has no cervical adenopathy.  Neurological: He is alert.  Skin: Skin is warm. No rash noted.  Psychiatric: His behavior is normal.  Vitals reviewed.         Assessment & Plan:  This patient has dizziness that he states got better when he stopped several of his medications.  It is plausible that his medications could be causing a side effect.  I believe it is  reasonable for him to do a follow-up visit bringing all of his medicines showing me exactly what he is and is not taking then we can decide what he needs to continue to take  15 minutes spent with patient greater than half in discussion of his issues of the dizziness I find no evidence of a stroke or heart attack going on currently  Depression some improvement has a lot of stress at home does not always get along with his wife but states he is not abusive or feeling like he is going to hurt her at all

## 2017-07-28 NOTE — Patient Instructions (Signed)
Please bring all your meds with you on your next visit

## 2017-08-03 ENCOUNTER — Encounter: Payer: Self-pay | Admitting: Internal Medicine

## 2017-08-03 ENCOUNTER — Ambulatory Visit (INDEPENDENT_AMBULATORY_CARE_PROVIDER_SITE_OTHER): Payer: Medicare Other | Admitting: Family Medicine

## 2017-08-03 ENCOUNTER — Ambulatory Visit (INDEPENDENT_AMBULATORY_CARE_PROVIDER_SITE_OTHER): Payer: Medicare Other | Admitting: Internal Medicine

## 2017-08-03 ENCOUNTER — Encounter: Payer: Self-pay | Admitting: Family Medicine

## 2017-08-03 VITALS — BP 112/80 | HR 95 | Ht 69.0 in | Wt 231.0 lb

## 2017-08-03 VITALS — BP 130/68 | Ht 69.0 in | Wt 230.0 lb

## 2017-08-03 DIAGNOSIS — I5022 Chronic systolic (congestive) heart failure: Secondary | ICD-10-CM

## 2017-08-03 DIAGNOSIS — I255 Ischemic cardiomyopathy: Secondary | ICD-10-CM | POA: Diagnosis not present

## 2017-08-03 DIAGNOSIS — R42 Dizziness and giddiness: Secondary | ICD-10-CM | POA: Diagnosis not present

## 2017-08-03 NOTE — Progress Notes (Signed)
   Subjective:    Patient ID: Lance [name], male    DOB: 17-Mar-1946, 72 y.o.   MRN: [id number]  HPI  Patient is here today to discuss his medications. He stopped a lot of his medications because he thought they were causing his dizziness.  He states he has since resumed the marjority of the medications, but stopped the magnesium.   States since the stopping of the magnesium dizziness has "Almost completely stopped".  Review of Systems  Constitutional: Negative for activity change, fatigue and fever.  HENT: Negative for congestion and rhinorrhea.   Respiratory: Negative for cough and shortness of breath.   Cardiovascular: Negative for chest pain and leg swelling.  Gastrointestinal: Negative for abdominal pain, diarrhea and nausea.  Genitourinary: Negative for dysuria and hematuria.  Neurological: Negative for weakness and headaches.  Psychiatric/Behavioral: Negative for behavioral problems.       Objective:   Physical Exam  Constitutional: He appears well-nourished. No distress.  HENT:  Head: Normocephalic and atraumatic.  Eyes: Right eye exhibits no discharge. Left eye exhibits no discharge.  Neck: No tracheal deviation present.  Cardiovascular: Normal rate, regular rhythm and normal heart sounds.  No murmur heard. Pulmonary/Chest: Effort normal and breath sounds normal. No respiratory distress. He has no wheezes.  Musculoskeletal: He exhibits no edema.  Lymphadenopathy:    He has no cervical adenopathy.  Neurological: He is alert.  Skin: Skin is warm. No rash noted.  Psychiatric: His behavior is normal.  Vitals reviewed.   15 minutes was spent with patient today discussing healthcare issues which they came. Greater than half of the discussion was answering questions and giving management guidance regarding the diagnosis for which the patient came.  Please see diagnosis discuss       Assessment & Plan:  Patient for blood pressure check up. Patient relates compliance  with meds. Todays BP reviewed with the patient. Patient denies issues with medication. Patient relates reasonable diet. Patient tries to minimize salt. Patient aware of BP goals.   Patient having some side effect issues I believe it is in his best interest being on multiple medicines to de prescribe medications Stop baclofen Stop dicyclomine Gradually taper off of gabapentin Stop Flomax-if urinary flow gets clinically worse restart it  Follow-up in approximately 4 months
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
## 2017-08-03 NOTE — Patient Instructions (Signed)
Medication Instructions:  Your physician recommends that you continue on your current medications as directed. Please refer to the Current Medication list given to you today.   Labwork: NONE   Testing/Procedures: NONE   Follow-Up: Your physician wants you to follow-up in: 1 Year with Dr. Lovena Le. You will receive a reminder letter in the mail two months in advance. If you don't receive a letter, please call our office to schedule the follow-up appointment.  Remote monitoring is used to monitor your Pacemaker of ICD from home. This monitoring reduces the number of office visits required to check your device to one time per year. It allows [place] to keep an eye on the functioning of your device to ensure it is working properly. You are scheduled for a device check from home on 10/06/17. You may send your transmission at any time that day. If you have a wireless device, the transmission will be sent automatically. After your physician reviews your transmission, you will receive a postcard with your next transmission date.    Any Other Special Instructions Will Be Listed Below (If Applicable).     If you need a refill on your cardiac medications before your next appointment, please call your pharmacy. Thank you for choosing [facility]!

## 2017-08-11 ENCOUNTER — Other Ambulatory Visit: Payer: Self-pay | Admitting: Family Medicine

## 2017-08-18 LAB — CUP PACEART INCLINIC DEVICE CHECK
Date Time Interrogation Session: 20190418124100
Implantable Lead Implant Date: 20050706
Implantable Lead Implant Date: 20161128
Implantable Lead Location: 753859
Implantable Lead Location: 753860
Implantable Lead Model: 7122
Implantable Pulse Generator Implant Date: 20130727
Pulse Gen Serial Number: 7041246

## 2017-08-29 ENCOUNTER — Other Ambulatory Visit: Payer: Self-pay | Admitting: Cardiology

## 2017-09-12 ENCOUNTER — Other Ambulatory Visit: Payer: Self-pay | Admitting: Family Medicine

## 2017-09-13 DIAGNOSIS — E1142 Type 2 diabetes mellitus with diabetic polyneuropathy: Secondary | ICD-10-CM | POA: Diagnosis not present

## 2017-09-13 DIAGNOSIS — B351 Tinea unguium: Secondary | ICD-10-CM | POA: Diagnosis not present

## 2017-09-15 ENCOUNTER — Other Ambulatory Visit: Payer: Self-pay | Admitting: Nurse Practitioner

## 2017-09-28 ENCOUNTER — Other Ambulatory Visit: Payer: Self-pay | Admitting: Cardiology

## 2017-09-29 DIAGNOSIS — I2510 Atherosclerotic heart disease of native coronary artery without angina pectoris: Secondary | ICD-10-CM | POA: Diagnosis not present

## 2017-09-29 DIAGNOSIS — Z794 Long term (current) use of insulin: Secondary | ICD-10-CM | POA: Diagnosis not present

## 2017-09-29 DIAGNOSIS — E1142 Type 2 diabetes mellitus with diabetic polyneuropathy: Secondary | ICD-10-CM | POA: Diagnosis not present

## 2017-09-29 DIAGNOSIS — Z5181 Encounter for therapeutic drug level monitoring: Secondary | ICD-10-CM | POA: Diagnosis not present

## 2017-10-05 DIAGNOSIS — E1142 Type 2 diabetes mellitus with diabetic polyneuropathy: Secondary | ICD-10-CM | POA: Diagnosis not present

## 2017-10-06 ENCOUNTER — Ambulatory Visit (INDEPENDENT_AMBULATORY_CARE_PROVIDER_SITE_OTHER): Payer: Medicare Other | Admitting: *Deleted

## 2017-10-06 DIAGNOSIS — I255 Ischemic cardiomyopathy: Secondary | ICD-10-CM

## 2017-10-06 DIAGNOSIS — I5022 Chronic systolic (congestive) heart failure: Secondary | ICD-10-CM

## 2017-10-06 NOTE — Progress Notes (Signed)
Remote ICD transmission.   

## 2017-10-08 ENCOUNTER — Other Ambulatory Visit: Payer: Self-pay | Admitting: Family Medicine

## 2017-10-10 ENCOUNTER — Telehealth: Payer: Self-pay | Admitting: Family Medicine

## 2017-10-10 DIAGNOSIS — H9190 Unspecified hearing loss, unspecified ear: Secondary | ICD-10-CM

## 2017-10-10 NOTE — Telephone Encounter (Signed)
Back to the doctor would likely be the way to go

## 2017-10-10 NOTE — Telephone Encounter (Signed)
Pt's wife calling in wanting some advice regarding pt's hearing. She states his hearing has gotten worse. He wont wear his hearing aids due to them making a buzzing noise when wearing them. He has been to the place where he got the hearing aids and that hasnt helped. Should he follow up with hearing doctor or go back to where he got the hearing aids.

## 2017-10-11 NOTE — Telephone Encounter (Signed)
Ok for six mo worth 

## 2017-10-11 NOTE — Telephone Encounter (Signed)
Pt notified and referral put in because he does not remember who he saw.

## 2017-10-11 NOTE — Telephone Encounter (Signed)
Left message to return call 

## 2017-10-18 DIAGNOSIS — E113591 Type 2 diabetes mellitus with proliferative diabetic retinopathy without macular edema, right eye: Secondary | ICD-10-CM | POA: Diagnosis not present

## 2017-10-18 DIAGNOSIS — H35371 Puckering of macula, right eye: Secondary | ICD-10-CM | POA: Diagnosis not present

## 2017-10-18 DIAGNOSIS — H4311 Vitreous hemorrhage, right eye: Secondary | ICD-10-CM | POA: Diagnosis not present

## 2017-10-18 DIAGNOSIS — E113512 Type 2 diabetes mellitus with proliferative diabetic retinopathy with macular edema, left eye: Secondary | ICD-10-CM | POA: Diagnosis not present

## 2017-10-24 ENCOUNTER — Ambulatory Visit (INDEPENDENT_AMBULATORY_CARE_PROVIDER_SITE_OTHER): Payer: Medicare Other | Admitting: Family Medicine

## 2017-10-24 ENCOUNTER — Encounter: Payer: Self-pay | Admitting: Family Medicine

## 2017-10-24 VITALS — BP 178/104 | Temp 98.4°F | Ht 69.0 in | Wt 223.0 lb

## 2017-10-24 DIAGNOSIS — I255 Ischemic cardiomyopathy: Secondary | ICD-10-CM

## 2017-10-24 DIAGNOSIS — R030 Elevated blood-pressure reading, without diagnosis of hypertension: Secondary | ICD-10-CM

## 2017-10-24 DIAGNOSIS — F439 Reaction to severe stress, unspecified: Secondary | ICD-10-CM | POA: Diagnosis not present
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
## 2017-10-26 DIAGNOSIS — H2513 Age-related nuclear cataract, bilateral: Secondary | ICD-10-CM | POA: Diagnosis not present

## 2017-10-26 DIAGNOSIS — H4311 Vitreous hemorrhage, right eye: Secondary | ICD-10-CM | POA: Diagnosis not present

## 2017-10-26 DIAGNOSIS — E113591 Type 2 diabetes mellitus with proliferative diabetic retinopathy without macular edema, right eye: Secondary | ICD-10-CM | POA: Diagnosis not present

## 2017-10-26 DIAGNOSIS — E113512 Type 2 diabetes mellitus with proliferative diabetic retinopathy with macular edema, left eye: Secondary | ICD-10-CM | POA: Diagnosis not present

## 2017-10-31 ENCOUNTER — Ambulatory Visit (INDEPENDENT_AMBULATORY_CARE_PROVIDER_SITE_OTHER): Payer: Medicare Other | Admitting: Family Medicine

## 2017-10-31 ENCOUNTER — Encounter: Payer: Self-pay | Admitting: Family Medicine

## 2017-10-31 VITALS — BP 138/86 | Ht 69.0 in | Wt 221.2 lb

## 2017-10-31 DIAGNOSIS — I255 Ischemic cardiomyopathy: Secondary | ICD-10-CM | POA: Diagnosis not present

## 2017-10-31 DIAGNOSIS — I10 Essential (primary) hypertension: Secondary | ICD-10-CM

## 2017-10-31 DIAGNOSIS — F439 Reaction to severe stress, unspecified: Secondary | ICD-10-CM
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
## 2017-11-17 ENCOUNTER — Ambulatory Visit (INDEPENDENT_AMBULATORY_CARE_PROVIDER_SITE_OTHER): Payer: Medicare Other | Admitting: Otolaryngology

## 2017-11-17 DIAGNOSIS — H903 Sensorineural hearing loss, bilateral: Secondary | ICD-10-CM | POA: Diagnosis not present

## 2017-11-17 DIAGNOSIS — H9011 Conductive hearing loss, unilateral, right ear, with unrestricted hearing on the contralateral side: Secondary | ICD-10-CM

## 2017-11-17 DIAGNOSIS — H8001 Otosclerosis involving oval window, nonobliterative, right ear: Secondary | ICD-10-CM | POA: Diagnosis not present

## 2017-11-22 DIAGNOSIS — B351 Tinea unguium: Secondary | ICD-10-CM | POA: Diagnosis not present

## 2017-11-22 DIAGNOSIS — E1142 Type 2 diabetes mellitus with diabetic polyneuropathy: Secondary | ICD-10-CM | POA: Diagnosis not present

## 2017-11-23 DIAGNOSIS — G894 Chronic pain syndrome: Secondary | ICD-10-CM | POA: Diagnosis not present

## 2017-11-23 DIAGNOSIS — G5603 Carpal tunnel syndrome, bilateral upper limbs: Secondary | ICD-10-CM | POA: Diagnosis not present

## 2017-11-23 DIAGNOSIS — Z79891 Long term (current) use of opiate analgesic: Secondary | ICD-10-CM | POA: Diagnosis not present

## 2017-11-23 DIAGNOSIS — Z79899 Other long term (current) drug therapy: Secondary | ICD-10-CM | POA: Diagnosis not present

## 2017-11-29 LAB — CUP PACEART REMOTE DEVICE CHECK
Date Time Interrogation Session: 20190730095637
Implantable Lead Implant Date: 20050706
Implantable Lead Implant Date: 20161128
Implantable Lead Location: 753859
Implantable Lead Location: 753860
Implantable Lead Model: 7122
Implantable Pulse Generator Implant Date: 20130727
Pulse Gen Serial Number: 7041246

## 2017-12-02 ENCOUNTER — Other Ambulatory Visit: Payer: Self-pay | Admitting: Family Medicine

## 2017-12-05 ENCOUNTER — Telehealth: Payer: Self-pay | Admitting: Family Medicine

## 2017-12-05 ENCOUNTER — Other Ambulatory Visit: Payer: Self-pay | Admitting: *Deleted

## 2017-12-05 MED ORDER — TAMSULOSIN HCL 0.4 MG PO CAPS: 0.4000 mg | ORAL_CAPSULE | Freq: Every day | ORAL | 5 refills | Status: DC

## 2017-12-05 NOTE — Telephone Encounter (Signed)
Refills sent. Pt notified.

## 2017-12-05 NOTE — Telephone Encounter (Signed)
Not on med list. Under history. Last sent in 04/28/17 for a 6 month supply.

## 2017-12-05 NOTE — Telephone Encounter (Signed)
Patient is requesting refill on Tamsulosin.  He will be out of this medication tomorrow.  [place]

## 2017-12-05 NOTE — Telephone Encounter (Signed)
It would be fine to add this back to the list-Flomax 0.4 mg nightly #30 with 6 refills

## 2017-12-06 ENCOUNTER — Other Ambulatory Visit: Payer: Self-pay | Admitting: *Deleted

## 2017-12-06 MED ORDER — TAMSULOSIN HCL 0.4 MG PO CAPS: 0.4000 mg | ORAL_CAPSULE | Freq: Every day | ORAL | 1 refills | Status: DC

## 2017-12-16 ENCOUNTER — Encounter: Payer: Self-pay | Admitting: Cardiology

## 2017-12-16 ENCOUNTER — Ambulatory Visit (INDEPENDENT_AMBULATORY_CARE_PROVIDER_SITE_OTHER): Payer: Medicare Other | Admitting: Cardiology

## 2017-12-16 VITALS — BP 126/76 | HR 88 | Ht 69.0 in | Wt 218.0 lb

## 2017-12-16 DIAGNOSIS — E782 Mixed hyperlipidemia: Secondary | ICD-10-CM | POA: Diagnosis not present

## 2017-12-16 DIAGNOSIS — I10 Essential (primary) hypertension: Secondary | ICD-10-CM | POA: Diagnosis not present

## 2017-12-16 DIAGNOSIS — I2510 Atherosclerotic heart disease of native coronary artery without angina pectoris: Secondary | ICD-10-CM | POA: Diagnosis not present

## 2017-12-16 DIAGNOSIS — I5032 Chronic diastolic (congestive) heart failure: Secondary | ICD-10-CM

## 2017-12-16 DIAGNOSIS — I255 Ischemic cardiomyopathy: Secondary | ICD-10-CM
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
## 2017-12-21 ENCOUNTER — Telehealth: Payer: Self-pay | Admitting: Internal Medicine

## 2017-12-21 NOTE — Telephone Encounter (Signed)
Letter mailed

## 2017-12-21 NOTE — Telephone Encounter (Signed)
Oct recall for ct chest

## 2017-12-30 ENCOUNTER — Telehealth: Payer: Self-pay | Admitting: Family Medicine

## 2017-12-30 NOTE — Telephone Encounter (Signed)
Patient calling to see if Dr. Nicki Reaper could change his "sleep" medicine because it isn't working. He tosses and turns all night.  He didn't remember the name of it and I couldn't tell since he takes several before bedtime.  I told him Dr. Nicki Reaper was out of the office until next week and someone will call him back then. He was fine with this. Next available appt for a regular office visit with Dr. Nicki Reaper is not until October 2nd.   [place]

## 2018-01-01 NOTE — Telephone Encounter (Signed)
May increase mirtazipine to 15 mg qhs, send in new rx, #30, 3 rf follow up ov if not seeing improvement over next 14 days Notify us if any issues

## 2018-01-03 ENCOUNTER — Other Ambulatory Visit: Payer: Self-pay | Admitting: *Deleted

## 2018-01-03 MED ORDER — MIRTAZAPINE 15 MG PO TABS: 15.0000 mg | ORAL_TABLET | Freq: Every day | ORAL | 3 refills | Status: DC

## 2018-01-03 NOTE — Telephone Encounter (Signed)
New dose sent to pharm. Tried to call no answer.

## 2018-01-04 ENCOUNTER — Telehealth: Payer: Self-pay | Admitting: Internal Medicine

## 2018-01-04 DIAGNOSIS — R918 Other nonspecific abnormal finding of lung field: Secondary | ICD-10-CM

## 2018-01-04 DIAGNOSIS — R911 Solitary pulmonary nodule: Secondary | ICD-10-CM

## 2018-01-04 NOTE — Addendum Note (Signed)
Addended by: Hassan Rowan on: 01/04/2018 09:45 AM   Modules accepted: Orders

## 2018-01-04 NOTE — Telephone Encounter (Signed)
[phone]  PATIENT RECEIVED LETTER TO SCHEDULE CT SCAN, PLEASE CALL

## 2018-01-04 NOTE — Telephone Encounter (Signed)
Pt.notified

## 2018-01-04 NOTE — Telephone Encounter (Signed)
CT chest w/o contrast scheduled for 02/03/18 at 9:00am, arrive at 8:45am. Called and informed pt. Letter mailed.

## 2018-01-04 NOTE — Telephone Encounter (Signed)
Left message to return call 

## 2018-01-05 ENCOUNTER — Ambulatory Visit (INDEPENDENT_AMBULATORY_CARE_PROVIDER_SITE_OTHER): Payer: Medicare Other | Admitting: *Deleted

## 2018-01-05 DIAGNOSIS — I5022 Chronic systolic (congestive) heart failure: Secondary | ICD-10-CM | POA: Diagnosis not present

## 2018-01-05 DIAGNOSIS — I255 Ischemic cardiomyopathy: Secondary | ICD-10-CM

## 2018-01-05 NOTE — Progress Notes (Signed)
Remote ICD transmission.   

## 2018-01-10 ENCOUNTER — Encounter: Payer: Self-pay | Admitting: Adult Health

## 2018-01-10 ENCOUNTER — Other Ambulatory Visit: Payer: Self-pay

## 2018-01-10 ENCOUNTER — Ambulatory Visit (INDEPENDENT_AMBULATORY_CARE_PROVIDER_SITE_OTHER): Payer: Medicare Other | Admitting: Adult Health

## 2018-01-10 VITALS — BP 116/74 | HR 79 | Resp 18 | Ht 69.0 in | Wt 216.0 lb

## 2018-01-10 DIAGNOSIS — I255 Ischemic cardiomyopathy: Secondary | ICD-10-CM

## 2018-01-10 DIAGNOSIS — R413 Other amnesia: Secondary | ICD-10-CM

## 2018-01-10 NOTE — Patient Instructions (Signed)
Your Plan:  Continue aricept- take in the mornings Memory score is stable If your symptoms worsen or you develop new symptoms please let us know.   Thank you for coming to see [place] at [facility]. I hope we have been able to provide you high quality care today.  You may receive a patient satisfaction survey over the next few weeks. We would appreciate your feedback and comments so that we may continue to improve ourselves and the health of our patients.
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
## 2018-01-10 NOTE — Progress Notes (Signed)
I have read the note, and I agree with the clinical assessment and plan.  Charles K Willis   

## 2018-01-16 DIAGNOSIS — M791 Myalgia, unspecified site: Secondary | ICD-10-CM | POA: Diagnosis not present

## 2018-01-16 DIAGNOSIS — G894 Chronic pain syndrome: Secondary | ICD-10-CM | POA: Diagnosis not present

## 2018-01-17 ENCOUNTER — Other Ambulatory Visit: Payer: Self-pay | Admitting: Family Medicine

## 2018-01-18 ENCOUNTER — Telehealth: Payer: Self-pay | Admitting: Family Medicine

## 2018-01-18 ENCOUNTER — Encounter: Payer: Self-pay | Admitting: Family Medicine

## 2018-01-18 ENCOUNTER — Other Ambulatory Visit: Payer: Self-pay | Admitting: Family Medicine

## 2018-01-18 MED ORDER — ALPRAZOLAM 0.5 MG PO TABS: 0.5000 mg | ORAL_TABLET | Freq: Two times a day (BID) | ORAL | 2 refills | Status: DC | PRN

## 2018-01-18 NOTE — Telephone Encounter (Signed)
The patient may have this +3 additional refills

## 2018-01-18 NOTE — Telephone Encounter (Signed)
Drug registry states that the patient is taking hydrocodone twice daily as needed Because of this will need to be on a lower dose of Xanax Xanax changed to 0.5 mg twice daily as needed Pharmacy will inform him of this change Letter will be sent to the patient

## 2018-01-24 DIAGNOSIS — H2511 Age-related nuclear cataract, right eye: Secondary | ICD-10-CM | POA: Diagnosis not present

## 2018-01-24 DIAGNOSIS — E113593 Type 2 diabetes mellitus with proliferative diabetic retinopathy without macular edema, bilateral: Secondary | ICD-10-CM | POA: Diagnosis not present

## 2018-01-24 DIAGNOSIS — H25043 Posterior subcapsular polar age-related cataract, bilateral: Secondary | ICD-10-CM | POA: Diagnosis not present

## 2018-01-24 DIAGNOSIS — Z794 Long term (current) use of insulin: Secondary | ICD-10-CM | POA: Diagnosis not present

## 2018-01-24 DIAGNOSIS — H25013 Cortical age-related cataract, bilateral: Secondary | ICD-10-CM | POA: Diagnosis not present

## 2018-01-24 DIAGNOSIS — H2513 Age-related nuclear cataract, bilateral: Secondary | ICD-10-CM | POA: Diagnosis not present

## 2018-01-24 DIAGNOSIS — H35371 Puckering of macula, right eye: Secondary | ICD-10-CM | POA: Diagnosis not present

## 2018-01-24 DIAGNOSIS — H02831 Dermatochalasis of right upper eyelid: Secondary | ICD-10-CM | POA: Diagnosis not present

## 2018-01-24 DIAGNOSIS — H18413 Arcus senilis, bilateral: Secondary | ICD-10-CM | POA: Diagnosis not present

## 2018-01-30 DIAGNOSIS — G5603 Carpal tunnel syndrome, bilateral upper limbs: Secondary | ICD-10-CM | POA: Diagnosis not present

## 2018-01-30 DIAGNOSIS — M79642 Pain in left hand: Secondary | ICD-10-CM | POA: Diagnosis not present

## 2018-01-30 DIAGNOSIS — M79641 Pain in right hand: Secondary | ICD-10-CM | POA: Diagnosis not present

## 2018-01-31 DIAGNOSIS — B351 Tinea unguium: Secondary | ICD-10-CM | POA: Diagnosis not present

## 2018-01-31 DIAGNOSIS — E1142 Type 2 diabetes mellitus with diabetic polyneuropathy: Secondary | ICD-10-CM | POA: Diagnosis not present

## 2018-01-31 DIAGNOSIS — S99921A Unspecified injury of right foot, initial encounter: Secondary | ICD-10-CM | POA: Diagnosis not present

## 2018-01-31 DIAGNOSIS — M79674 Pain in right toe(s): Secondary | ICD-10-CM | POA: Diagnosis not present

## 2018-01-31 LAB — CUP PACEART REMOTE DEVICE CHECK
Date Time Interrogation Session: 20191001080916
Implantable Lead Implant Date: 20050706
Implantable Lead Implant Date: 20161128
Implantable Lead Location: 753859
Implantable Lead Location: 753860
Implantable Lead Model: 7122
Implantable Pulse Generator Implant Date: 20130727
Pulse Gen Serial Number: 7041246

## 2018-02-01 DIAGNOSIS — G5603 Carpal tunnel syndrome, bilateral upper limbs: Secondary | ICD-10-CM | POA: Diagnosis not present

## 2018-02-03 ENCOUNTER — Ambulatory Visit (HOSPITAL_COMMUNITY): Payer: Medicare Other

## 2018-02-03 DIAGNOSIS — H25012 Cortical age-related cataract, left eye: Secondary | ICD-10-CM | POA: Diagnosis not present

## 2018-02-03 DIAGNOSIS — H2512 Age-related nuclear cataract, left eye: Secondary | ICD-10-CM | POA: Diagnosis not present

## 2018-02-03 DIAGNOSIS — H25042 Posterior subcapsular polar age-related cataract, left eye: Secondary | ICD-10-CM | POA: Diagnosis not present

## 2018-02-03 DIAGNOSIS — H2511 Age-related nuclear cataract, right eye: Secondary | ICD-10-CM | POA: Diagnosis not present

## 2018-02-09 ENCOUNTER — Other Ambulatory Visit: Payer: Self-pay | Admitting: Cardiology

## 2018-02-09 NOTE — Telephone Encounter (Signed)
Refill complete 

## 2018-02-11 ENCOUNTER — Emergency Department (HOSPITAL_COMMUNITY)
Admission: EM | Admit: 2018-02-11 | Discharge: 2018-02-12 | Disposition: A | Discharge status: Home / Self Care | Payer: Medicare Other | Attending: Emergency Medicine | Admitting: Emergency Medicine

## 2018-02-11 ENCOUNTER — Emergency Department (HOSPITAL_COMMUNITY): Payer: Medicare Other

## 2018-02-11 ENCOUNTER — Encounter (HOSPITAL_COMMUNITY): Payer: Self-pay | Admitting: Emergency Medicine

## 2018-02-11 DIAGNOSIS — Z951 Presence of aortocoronary bypass graft: Secondary | ICD-10-CM | POA: Insufficient documentation

## 2018-02-11 DIAGNOSIS — I509 Heart failure, unspecified: Secondary | ICD-10-CM | POA: Insufficient documentation

## 2018-02-11 DIAGNOSIS — W19XXXA Unspecified fall, initial encounter: Secondary | ICD-10-CM | POA: Diagnosis not present

## 2018-02-11 DIAGNOSIS — Z87891 Personal history of nicotine dependence: Secondary | ICD-10-CM | POA: Diagnosis not present

## 2018-02-11 DIAGNOSIS — S4992XA Unspecified injury of left shoulder and upper arm, initial encounter: Secondary | ICD-10-CM | POA: Diagnosis not present

## 2018-02-11 DIAGNOSIS — Z7982 Long term (current) use of aspirin: Secondary | ICD-10-CM | POA: Diagnosis not present

## 2018-02-11 DIAGNOSIS — Y929 Unspecified place or not applicable: Secondary | ICD-10-CM | POA: Insufficient documentation

## 2018-02-11 DIAGNOSIS — M25512 Pain in left shoulder: Secondary | ICD-10-CM | POA: Diagnosis not present

## 2018-02-11 DIAGNOSIS — S40012A Contusion of left shoulder, initial encounter: Secondary | ICD-10-CM | POA: Diagnosis not present

## 2018-02-11 DIAGNOSIS — Z79899 Other long term (current) drug therapy: Secondary | ICD-10-CM | POA: Insufficient documentation

## 2018-02-11 DIAGNOSIS — Y999 Unspecified external cause status: Secondary | ICD-10-CM | POA: Diagnosis not present

## 2018-02-11 DIAGNOSIS — Z95810 Presence of automatic (implantable) cardiac defibrillator: Secondary | ICD-10-CM | POA: Insufficient documentation

## 2018-02-11 DIAGNOSIS — I110 Hypertensive heart disease with heart failure: Secondary | ICD-10-CM | POA: Diagnosis not present

## 2018-02-11 DIAGNOSIS — Y939 Activity, unspecified: Secondary | ICD-10-CM | POA: Insufficient documentation

## 2018-02-11 DIAGNOSIS — E119 Type 2 diabetes mellitus without complications: Secondary | ICD-10-CM | POA: Diagnosis not present

## 2018-02-11 NOTE — ED Triage Notes (Signed)
Pt presents to ER with L shoulder pain after fall yesterday afternoon; pt states he tripped on a rock and landed on L shoulder, arm in homemade sling in position of comfort, no obvious deformity; denies striking head or loc
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
## 2018-02-20 DIAGNOSIS — H2512 Age-related nuclear cataract, left eye: Secondary | ICD-10-CM | POA: Diagnosis not present

## 2018-02-22 DIAGNOSIS — M25512 Pain in left shoulder: Secondary | ICD-10-CM | POA: Diagnosis not present

## 2018-02-22 DIAGNOSIS — M13812 Other specified arthritis, left shoulder: Secondary | ICD-10-CM | POA: Diagnosis not present

## 2018-02-22 DIAGNOSIS — S46012A Strain of muscle(s) and tendon(s) of the rotator cuff of left shoulder, initial encounter: Secondary | ICD-10-CM | POA: Diagnosis not present

## 2018-03-02 DIAGNOSIS — I2510 Atherosclerotic heart disease of native coronary artery without angina pectoris: Secondary | ICD-10-CM | POA: Diagnosis not present

## 2018-03-02 DIAGNOSIS — Z23 Encounter for immunization: Secondary | ICD-10-CM | POA: Diagnosis not present

## 2018-03-02 DIAGNOSIS — E1142 Type 2 diabetes mellitus with diabetic polyneuropathy: Secondary | ICD-10-CM | POA: Diagnosis not present

## 2018-03-02 DIAGNOSIS — Z794 Long term (current) use of insulin: Secondary | ICD-10-CM | POA: Diagnosis not present

## 2018-03-02 DIAGNOSIS — Z79899 Other long term (current) drug therapy: Secondary | ICD-10-CM | POA: Diagnosis not present

## 2018-03-15 ENCOUNTER — Other Ambulatory Visit: Payer: Self-pay | Admitting: Family Medicine

## 2018-03-17 DIAGNOSIS — G5603 Carpal tunnel syndrome, bilateral upper limbs: Secondary | ICD-10-CM | POA: Diagnosis not present

## 2018-03-17 DIAGNOSIS — G894 Chronic pain syndrome: Secondary | ICD-10-CM | POA: Diagnosis not present

## 2018-03-17 DIAGNOSIS — M791 Myalgia, unspecified site: Secondary | ICD-10-CM | POA: Diagnosis not present

## 2018-03-17 NOTE — Telephone Encounter (Signed)
Sent to Dr.Scott two days ago.Please advise as pt is out of medication.

## 2018-03-17 NOTE — Telephone Encounter (Signed)
Wife is checking on status of refill. Pt is now out of the medication.

## 2018-03-21 DIAGNOSIS — M62512 Muscle wasting and atrophy, not elsewhere classified, left shoulder: Secondary | ICD-10-CM | POA: Diagnosis not present

## 2018-03-21 DIAGNOSIS — M25612 Stiffness of left shoulder, not elsewhere classified: Secondary | ICD-10-CM | POA: Diagnosis not present

## 2018-03-21 DIAGNOSIS — M25312 Other instability, left shoulder: Secondary | ICD-10-CM | POA: Diagnosis not present

## 2018-03-21 DIAGNOSIS — M25512 Pain in left shoulder: Secondary | ICD-10-CM | POA: Diagnosis not present

## 2018-03-22 DIAGNOSIS — M25512 Pain in left shoulder: Secondary | ICD-10-CM | POA: Diagnosis not present

## 2018-03-22 DIAGNOSIS — M25312 Other instability, left shoulder: Secondary | ICD-10-CM | POA: Diagnosis not present

## 2018-03-22 DIAGNOSIS — M25612 Stiffness of left shoulder, not elsewhere classified: Secondary | ICD-10-CM | POA: Diagnosis not present

## 2018-03-22 DIAGNOSIS — M62512 Muscle wasting and atrophy, not elsewhere classified, left shoulder: Secondary | ICD-10-CM | POA: Diagnosis not present

## 2018-03-23 DIAGNOSIS — M25512 Pain in left shoulder: Secondary | ICD-10-CM | POA: Diagnosis not present

## 2018-03-23 DIAGNOSIS — M25612 Stiffness of left shoulder, not elsewhere classified: Secondary | ICD-10-CM | POA: Diagnosis not present

## 2018-03-23 DIAGNOSIS — M25312 Other instability, left shoulder: Secondary | ICD-10-CM | POA: Diagnosis not present

## 2018-03-23 DIAGNOSIS — M62512 Muscle wasting and atrophy, not elsewhere classified, left shoulder: Secondary | ICD-10-CM | POA: Diagnosis not present

## 2018-03-27 DIAGNOSIS — M25512 Pain in left shoulder: Secondary | ICD-10-CM | POA: Diagnosis not present

## 2018-03-27 DIAGNOSIS — M25312 Other instability, left shoulder: Secondary | ICD-10-CM | POA: Diagnosis not present

## 2018-03-27 DIAGNOSIS — M25612 Stiffness of left shoulder, not elsewhere classified: Secondary | ICD-10-CM | POA: Diagnosis not present

## 2018-03-27 DIAGNOSIS — M62512 Muscle wasting and atrophy, not elsewhere classified, left shoulder: Secondary | ICD-10-CM | POA: Diagnosis not present

## 2018-03-29 DIAGNOSIS — M62512 Muscle wasting and atrophy, not elsewhere classified, left shoulder: Secondary | ICD-10-CM | POA: Diagnosis not present

## 2018-03-29 DIAGNOSIS — M25512 Pain in left shoulder: Secondary | ICD-10-CM | POA: Diagnosis not present

## 2018-03-29 DIAGNOSIS — M25612 Stiffness of left shoulder, not elsewhere classified: Secondary | ICD-10-CM | POA: Diagnosis not present

## 2018-03-29 DIAGNOSIS — M25312 Other instability, left shoulder: Secondary | ICD-10-CM | POA: Diagnosis not present

## 2018-04-03 DIAGNOSIS — M25612 Stiffness of left shoulder, not elsewhere classified: Secondary | ICD-10-CM | POA: Diagnosis not present

## 2018-04-03 DIAGNOSIS — M25512 Pain in left shoulder: Secondary | ICD-10-CM | POA: Diagnosis not present

## 2018-04-03 DIAGNOSIS — M25312 Other instability, left shoulder: Secondary | ICD-10-CM | POA: Diagnosis not present

## 2018-04-03 DIAGNOSIS — M62512 Muscle wasting and atrophy, not elsewhere classified, left shoulder: Secondary | ICD-10-CM | POA: Diagnosis not present

## 2018-04-06 ENCOUNTER — Encounter: Payer: Self-pay | Admitting: Cardiology

## 2018-04-06 ENCOUNTER — Ambulatory Visit (INDEPENDENT_AMBULATORY_CARE_PROVIDER_SITE_OTHER): Payer: Medicare Other

## 2018-04-06 DIAGNOSIS — I255 Ischemic cardiomyopathy: Secondary | ICD-10-CM

## 2018-04-06 NOTE — Progress Notes (Signed)
Remote ICD transmission.   

## 2018-04-11 ENCOUNTER — Other Ambulatory Visit: Payer: Self-pay | Admitting: Family Medicine

## 2018-04-11 ENCOUNTER — Encounter: Payer: Self-pay | Admitting: Cardiology

## 2018-04-12 DIAGNOSIS — M25612 Stiffness of left shoulder, not elsewhere classified: Secondary | ICD-10-CM | POA: Diagnosis not present

## 2018-04-12 DIAGNOSIS — M62512 Muscle wasting and atrophy, not elsewhere classified, left shoulder: Secondary | ICD-10-CM | POA: Diagnosis not present

## 2018-04-12 DIAGNOSIS — M25312 Other instability, left shoulder: Secondary | ICD-10-CM | POA: Diagnosis not present

## 2018-04-12 DIAGNOSIS — M25512 Pain in left shoulder: Secondary | ICD-10-CM | POA: Diagnosis not present

## 2018-04-17 DIAGNOSIS — M25312 Other instability, left shoulder: Secondary | ICD-10-CM | POA: Diagnosis not present

## 2018-04-17 DIAGNOSIS — M62512 Muscle wasting and atrophy, not elsewhere classified, left shoulder: Secondary | ICD-10-CM | POA: Diagnosis not present

## 2018-04-17 DIAGNOSIS — M25612 Stiffness of left shoulder, not elsewhere classified: Secondary | ICD-10-CM | POA: Diagnosis not present

## 2018-04-17 DIAGNOSIS — M25512 Pain in left shoulder: Secondary | ICD-10-CM | POA: Diagnosis not present

## 2018-04-17 DIAGNOSIS — G5603 Carpal tunnel syndrome, bilateral upper limbs: Secondary | ICD-10-CM | POA: Diagnosis not present

## 2018-04-17 DIAGNOSIS — G5620 Lesion of ulnar nerve, unspecified upper limb: Secondary | ICD-10-CM | POA: Diagnosis not present

## 2018-04-19 ENCOUNTER — Encounter: Payer: Self-pay | Admitting: Family Medicine

## 2018-04-19 ENCOUNTER — Ambulatory Visit (INDEPENDENT_AMBULATORY_CARE_PROVIDER_SITE_OTHER): Payer: Medicare Other | Admitting: Family Medicine

## 2018-04-19 VITALS — BP 134/82 | Temp 98.3°F | Ht 69.0 in | Wt 215.0 lb

## 2018-04-19 DIAGNOSIS — J019 Acute sinusitis, unspecified: Secondary | ICD-10-CM

## 2018-04-19 MED ORDER — DOXYCYCLINE HYCLATE 100 MG PO TABS: 100.0000 mg | ORAL_TABLET | Freq: Two times a day (BID) | ORAL | 0 refills | Status: AC

## 2018-04-19 NOTE — Progress Notes (Signed)
   Subjective:    Patient ID: Lance [name], male    DOB: 07-03-1945, 72 y.o.   MRN: [id number]  HPI  Patient is here today with a runny nose,"sick at stomach",he says he feels like he is going to vomit. He says he has had diarrhea off and on for the week.He also has a headache that will not go away and this also has been ongoing for the last week also.He has not tried taking any medications.He reports feeling dizzy headed this week also.  Reports 5 days ago woke up not feeling well, reports rhinorrhea, cough productive of phlegm, and diarrhea intermittently. Reports diarrhea a couple times a day, denies blood in stool. Denies abdominal pain. Reports nausea, no vomiting. Denies fever. Reports h/a to frontal area of head as well. Feels like symptoms are getting worse. Reports some shortness of breath and wheezing. Reports decreased appetite, but drinking lots of fluids.   Reports blood pressure has been up all week despite taking medications.    Review of Systems  Constitutional: Positive for appetite change. Negative for chills and fever.  HENT: Positive for congestion and rhinorrhea. Negative for ear pain, sinus pressure, sinus pain and sore throat.   Respiratory: Positive for cough, shortness of breath and wheezing.   Cardiovascular: Negative for chest pain.  Gastrointestinal: Positive for diarrhea and nausea. Negative for abdominal pain, blood in stool and vomiting.       Objective:   Physical Exam Vitals signs and nursing note reviewed.  Constitutional:      General: He is not in acute distress.    Appearance: He is well-developed. He is not toxic-appearing.  HENT:     Head: Normocephalic and atraumatic.     Right Ear: Tympanic membrane normal.     Left Ear: Tympanic membrane normal.     Nose: Nose normal.     Mouth/Throat:     Mouth: Mucous membranes are moist.     Pharynx: Oropharynx is clear.  Eyes:     General:        Right eye: No discharge.        Left eye: No  discharge.  Neck:     Musculoskeletal: Neck supple.  Cardiovascular:     Rate and Rhythm: Normal rate and regular rhythm.     Heart sounds: Normal heart sounds.  Pulmonary:     Effort: Pulmonary effort is normal. No respiratory distress.     Breath sounds: Normal breath sounds. No wheezing, rhonchi or rales.  Abdominal:     General: Bowel sounds are normal. There is no distension.     Palpations: Abdomen is soft. There is no mass.     Tenderness: There is no abdominal tenderness.  Lymphadenopathy:     Cervical: No cervical adenopathy.  Skin:    General: Skin is warm and dry.  Neurological:     Mental Status: He is alert and oriented to person, place, and time.        Assessment & Plan:  Acute rhinosinusitis  Likely post-viral etiology. Will treat with doxy x 7 days. Discussed importance of staying hydrated, bland diet, avoidance of decongestants. Warning signs discussed, f/u if symptoms worsen or fail to improve.  Dr. Sallee Lange was consulted on this case and is in agreement with the above treatment plan.

## 2018-04-19 NOTE — Patient Instructions (Signed)
Bland Diet  A bland diet consists of foods that are often soft and do not have a lot of fat, fiber, or extra seasonings. Foods without fat, fiber, or seasoning are easier for the body to digest. They are also less likely to irritate your mouth, throat, stomach, and other parts of your digestive system. A bland diet is sometimes called a BRAT diet.  What is my plan?  Your health care provider or food and nutrition specialist (dietitian) may recommend specific changes to your diet to prevent symptoms or to treat your symptoms. These changes may include:   Eating small meals often.   Cooking food until it is soft enough to chew easily.   Chewing your food well.   Drinking fluids slowly.   Not eating foods that are very spicy, sour, or fatty.   Not eating citrus fruits, such as oranges and grapefruit.  What do I need to know about this diet?   Eat a variety of foods from the bland diet food list.   Do not follow a bland diet longer than needed.   Ask your health care provider whether you should take vitamins or supplements.  What foods can I eat?  Grains    Hot cereals, such as cream of wheat. Rice. Bread, crackers, or tortillas made from refined white flour.  Vegetables  Canned or cooked vegetables. Mashed or boiled potatoes.  Fruits    Bananas. Applesauce. Other types of cooked or canned fruit with the skin and seeds removed, such as canned peaches or pears.  Meats and other proteins    Scrambled eggs. Creamy peanut butter or other nut butters. Lean, well-cooked meats, such as chicken or fish. Tofu. Soups or broths.  Dairy  Low-fat dairy products, such as milk, cottage cheese, or yogurt.  Beverages    Water. Herbal tea. Apple juice.  Fats and oils  Mild salad dressings. Canola or olive oil.  Sweets and desserts  Pudding. Custard. Fruit gelatin. Ice cream.  The items listed above may not be a complete list of recommended foods and beverages. Contact a dietitian for more options.  What foods are not  recommended?  Grains  Whole grain breads and cereals.  Vegetables  Raw vegetables.  Fruits  Raw fruits, especially citrus, berries, or dried fruits.  Dairy  Whole fat dairy foods.  Beverages  Caffeinated drinks. Alcohol.  Seasonings and condiments  Strongly flavored seasonings or condiments. Hot sauce. Salsa.  Other foods  Spicy foods. Fried foods. Sour foods, such as pickled or fermented foods. Foods with high sugar content. Foods high in fiber.  The items listed above may not be a complete list of foods and beverages to avoid. Contact a dietitian for more information.  Summary   A bland diet consists of foods that are often soft and do not have a lot of fat, fiber, or extra seasonings.   Foods without fat, fiber, or seasoning are easier for the body to digest.   Check with your health care provider to see how long you should follow this diet plan. It is not meant to be followed for long periods.  This information is not intended to replace advice given to you by your health care provider. Make sure you discuss any questions you have with your health care provider.  Document Released: 08/11/2015 Document Revised: 05/18/2017 Document Reviewed: 05/18/2017  Elsevier Interactive Patient Education  2019 [employer]

## 2018-04-28 ENCOUNTER — Other Ambulatory Visit: Payer: Self-pay | Admitting: Family Medicine

## 2018-04-30 NOTE — Telephone Encounter (Signed)
I have reviewed over this patient's medications It is possible that this medication can interact with his Cymbalta triggering a more serious side effect I recommend stopping this medicine I recommend the patient do a standard office visit with me within the next 30 days to review his medications Please inform the patient to stop this medicine inform the pharmacy to stop this medicine please inform the patient the reason we are stopping it is because it can cause a potentially dangerous interaction with his antidepressant Cymbalta

## 2018-05-01 ENCOUNTER — Telehealth: Payer: Self-pay

## 2018-05-01 ENCOUNTER — Other Ambulatory Visit: Payer: Self-pay

## 2018-05-01 MED ORDER — ONDANSETRON HCL 8 MG PO TABS: ORAL_TABLET | ORAL | 0 refills | Status: DC

## 2018-05-01 MED ORDER — LOSARTAN POTASSIUM 25 MG PO TABS: ORAL_TABLET | ORAL | 5 refills | Status: DC

## 2018-05-01 NOTE — Telephone Encounter (Signed)
Patient states he checked his bp this am and his bottom # is 50, he wanted to mention as he has had some dizziness.

## 2018-05-01 NOTE — Telephone Encounter (Signed)
Patient is aware of medication changed and that we sent in the new medication to [facility]. He has an appt with you on 05/12/2018 and is aware to bring medications with him.

## 2018-05-01 NOTE — Telephone Encounter (Signed)
I would recommend reducing his losartan New dose 25 mg daily Please send in 30-day with 5 refills Recommend patient do a follow-up office visit somewhere within the next 30 days bring all of his medicines to that visit

## 2018-05-01 NOTE — Telephone Encounter (Signed)
Patient is aware of all and will call back for an appt. 

## 2018-05-01 NOTE — Telephone Encounter (Signed)
Patient is aware of all. 

## 2018-05-01 NOTE — Telephone Encounter (Signed)
Patient called this am and states he has been "sick" Nauseous  on his stomach for a week and a 1/2. He has no vomiting,no diarrhea,no fever, he still having the dizziness, he states he addressed these issues at his last visit, and they are no better. If medication sent in for him it will need to be sent to [facility].

## 2018-05-01 NOTE — Telephone Encounter (Signed)
May use Zofran 8 mg 1 3 times daily as needed nausea, 15, no refills  I would recommend the patient do a medication check office visit in January with me, bring medications with him to the office visit, follow-up sooner if any ongoing troubles

## 2018-05-12 ENCOUNTER — Encounter: Payer: Self-pay | Admitting: Family Medicine

## 2018-05-12 ENCOUNTER — Ambulatory Visit (INDEPENDENT_AMBULATORY_CARE_PROVIDER_SITE_OTHER): Payer: Medicare Other | Admitting: Family Medicine

## 2018-05-12 VITALS — BP 132/78 | Ht 69.0 in | Wt 217.0 lb

## 2018-05-12 DIAGNOSIS — F439 Reaction to severe stress, unspecified: Secondary | ICD-10-CM

## 2018-05-12 DIAGNOSIS — F320 Major depressive disorder, single episode, mild: Secondary | ICD-10-CM

## 2018-05-12 DIAGNOSIS — I10 Essential (primary) hypertension: Secondary | ICD-10-CM | POA: Diagnosis not present

## 2018-05-12 MED ORDER — LOSARTAN POTASSIUM 50 MG PO TABS: ORAL_TABLET | ORAL | 2 refills | Status: DC

## 2018-05-12 MED ORDER — BUPROPION HCL ER (SR) 150 MG PO TB12: 150.0000 mg | ORAL_TABLET | Freq: Every day | ORAL | 5 refills | Status: DC

## 2018-05-12 MED ORDER — ALPRAZOLAM 0.5 MG PO TABS: 0.5000 mg | ORAL_TABLET | Freq: Two times a day (BID) | ORAL | 2 refills | Status: DC | PRN

## 2018-05-12 MED ORDER — MIRTAZAPINE 15 MG PO TABS: 15.0000 mg | ORAL_TABLET | Freq: Every day | ORAL | 3 refills | Status: DC

## 2018-05-12 NOTE — Progress Notes (Signed)
Subjective:    Patient ID: Lance [name], male    DOB: 1946/04/16, 73 y.o.   MRN: [id number]  HPI  Patient is here today to follow up on his chronic health issues.  Patient for blood pressure check up.  The patient does have hypertension.  The patient is on medication.  Patient relates compliance with meds. Todays BP reviewed with the patient. Patient denies issues with medication. Patient relates reasonable diet. Patient tries to minimize salt. Patient aware of BP goals.  Patient here for follow-up regarding cholesterol.  The patient does have hyperlipidemia.  Patient does try to maintain a reasonable diet.  Patient does take the medication on a regular basis.  Denies missing a dose.  The patient denies any obvious side effects.  Prior blood work results reviewed with the patient.  The patient is aware of his cholesterol goals and the need to keep it under good control to lessen the risk of disease.   He has a history of diabetes ad takes Jardiance 25 mg once per day, Levemir 90 uits Sq Qhs The patient was seen today as part of a comprehensive diabetic check up.the patient does have diabetes.  The patient follows here on a regular basis.  The patient relates medication compliance. No significant side effects to the medications. Denies any low glucose spells. Relates compliance with diet to a reasonable level. Patient does do labwork intermittently and understands the dangers of diabetes.    He has a history of Hypertension and is on Losartan 25 mg one per day  He has a history of depression is taking Cymbalta 60 mg once per day.  He has a history of hyperlipidemia and takers Atorvastatin 80 mg once per day/  Review of Systems  Constitutional: Negative for diaphoresis and fatigue.  HENT: Negative for congestion and rhinorrhea.   Respiratory: Negative for cough and shortness of breath.   Cardiovascular: Negative for chest pain and leg swelling.  Gastrointestinal: Negative for abdominal  pain and diarrhea.  Skin: Negative for color change and rash.  Neurological: Negative for dizziness and headaches.  Psychiatric/Behavioral: Negative for behavioral problems and confusion.       Objective:   Physical Exam Vitals signs reviewed.  Constitutional:      General: He is not in acute distress. HENT:     Head: Normocephalic and atraumatic.  Eyes:     General:        Right eye: No discharge.        Left eye: No discharge.  Neck:     Trachea: No tracheal deviation.  Cardiovascular:     Rate and Rhythm: Normal rate and regular rhythm.     Heart sounds: Normal heart sounds. No murmur.  Pulmonary:     Effort: Pulmonary effort is normal. No respiratory distress.     Breath sounds: Normal breath sounds.  Lymphadenopathy:     Cervical: No cervical adenopathy.  Skin:    General: Skin is warm and dry.  Neurological:     Mental Status: He is alert.     Coordination: Coordination normal.  Psychiatric:        Behavior: Behavior normal.           Assessment & Plan:  Major depression we talked about how since Cymbalta is not helping him we will try Wellbutrin once per day we also talked about how without Remeron he is just not sleeping well so therefore we will reinitiate this  His diabetes under fair control  could be better he is followed by endocrinology  HTN- Patient was seen today as part of a visit regarding hypertension. The importance of healthy diet and regular physical activity was discussed. The importance of compliance with medications discussed.  Ideal goal is to keep blood pressure low elevated levels certainly below 458/09 when possible.  The patient was counseled that keeping blood pressure under control lessen his risk of complications.  The importance of regular follow-ups was discussed with the patient.  Low-salt diet such as DASH recommended.  Regular physical activity was recommended as well.  Patient was advised to keep regular follow-ups.  The  patient was seen today as part of an evaluation regarding hyperlipidemia.  Recent lab work has been reviewed with the patient as well as the goals for good cholesterol care.  In addition to this medications have been discussed the importance of compliance with diet and medications discussed as well.  Finally the patient is aware that poor control of cholesterol, noncompliance can dramatically increase the risk of complications. The patient will keep regular office visits and the patient does agreed to periodic lab work.  Chronic back pain uses hydrocodone sparingly does not need any currently  Blood pressure is slightly elevated above where we would like for her to be increase losartan 50 mg  Patient was advised to follow-up with [place] again in approximately 6 to 8 weeks to see how the change in his medicine are doing

## 2018-05-16 ENCOUNTER — Telehealth: Payer: Self-pay | Admitting: Family Medicine

## 2018-05-16 NOTE — Telephone Encounter (Signed)
Please advise. Thank you

## 2018-05-16 NOTE — Telephone Encounter (Signed)
BP READINGS 05/16/18  8:30am Sitting: 124/78 Standing: 87/53  9:00am Sitting:121/75 Standing:120/73  9:40am Sitting: 123/74 Standing: 117/101  10:45am Sitting: 122/75 Standing: 110/62  1:20pm Sitting: 161/104 Standing: 146/86

## 2018-05-17 NOTE — Telephone Encounter (Signed)
The majority of the readings were relatively okay I would keep the medications as is I would also recommend patient not check blood pressures quite as often unless he really feels he needs to do so occasional checks a couple times a week would be fine It is not necessary for him to check blood pressure sitting and standing unless he feels the need to do so Send [place] readings again in a few weeks time Keep appointment with cardiology in February Keep follow-up with [place]

## 2018-05-17 NOTE — Telephone Encounter (Signed)
Patient vm not set up. I called and wife answered and asked that she please have pt r/c.

## 2018-05-17 NOTE — Telephone Encounter (Signed)
Left message to return call 

## 2018-05-17 NOTE — Telephone Encounter (Signed)
Pt states that he has been taking his BP sitting and standing because when sitting and gets up to walk he feels like he is going to pass out. Been going on for a while and then started back. Monday and Tuesday this happened. Informed patient that provider stated it is not necessary to check BP as often unless pt feels it needs to be checked; keep med same and follow up with cardiology in Feb and to keep follow up with [place]. Pt verbalized understanding.

## 2018-05-17 NOTE — Telephone Encounter (Signed)
I called and spoke with the pt he states the dizziness is mostly in the am's and around lunch time the dizziness goes away. I asked if this happens to dissipate once he eats, he says no. I advised that no change currently,but if continues he will need to set up an appt for next week. I asked if he wanted to set this up now, he says no he will call back if continues to set up an appointment.

## 2018-05-17 NOTE — Telephone Encounter (Signed)
Please touch base with patient with his symptomatology that he relates he is having Let the patient know that I will be happy to recheck him next week if he is interested in doing so If he is still having troubles at that time we can help him adjust his medicine  I would not recommend reducing his blood pressure medicine currently because of his underlying heart related issues

## 2018-05-18 DIAGNOSIS — G894 Chronic pain syndrome: Secondary | ICD-10-CM | POA: Diagnosis not present

## 2018-05-18 DIAGNOSIS — M791 Myalgia, unspecified site: Secondary | ICD-10-CM | POA: Diagnosis not present

## 2018-05-18 DIAGNOSIS — M7552 Bursitis of left shoulder: Secondary | ICD-10-CM | POA: Diagnosis not present

## 2018-05-22 ENCOUNTER — Other Ambulatory Visit: Payer: Self-pay | Admitting: Specialist

## 2018-05-22 DIAGNOSIS — E109 Type 1 diabetes mellitus without complications: Secondary | ICD-10-CM | POA: Diagnosis not present

## 2018-05-22 DIAGNOSIS — Z950 Presence of cardiac pacemaker: Secondary | ICD-10-CM | POA: Diagnosis not present

## 2018-05-22 DIAGNOSIS — M25512 Pain in left shoulder: Secondary | ICD-10-CM

## 2018-05-22 DIAGNOSIS — M7542 Impingement syndrome of left shoulder: Secondary | ICD-10-CM | POA: Diagnosis not present

## 2018-05-22 DIAGNOSIS — M13819 Other specified arthritis, unspecified shoulder: Secondary | ICD-10-CM | POA: Diagnosis not present

## 2018-05-27 LAB — CUP PACEART REMOTE DEVICE CHECK
Date Time Interrogation Session: 20200125065047
Implantable Lead Implant Date: 20050706
Implantable Lead Implant Date: 20161128
Implantable Lead Location: 753859
Implantable Lead Location: 753860
Implantable Lead Model: 7122
Implantable Pulse Generator Implant Date: 20130727
Pulse Gen Serial Number: 7041246

## 2018-05-28 ENCOUNTER — Telehealth: Payer: Self-pay | Admitting: Family Medicine

## 2018-05-28 NOTE — Telephone Encounter (Signed)
Nurses-this patient has seen Dr.Beane orthopedics and they are recommending an arthrogram of his shoulder.  For him to have this test he will have to stop his Plavix 5 days before the procedure.  Please let the patient know that this is a reasonable thing for him to do.  There is a slight risk with stopping Plavix that a heart attack could occur but the likelihood of this would be low.  Therefore in this situation the benefit of the test outweighs the risk of stopping the medicine 5 days in advance.  If he is in agreement with that then please prepare a handwritten letter stating that he could stop his Plavix 5 days before arthrogram procedure which is scheduled to occur on 6 February  I will sign this letter then we will fax it to [facility] imaging scheduling department Fax number [phone]  If he does not want to stop the medicine then he will need to discuss with orthopedics because they will not do the arthrogram  Feel free to send me outcome of this message

## 2018-05-29 NOTE — Telephone Encounter (Signed)
Patient advised per Dr Nicki Reaper: Patient has seen Dr.Beane orthopedics and they are recommending an arthrogram of his shoulder.  For him to have this test he will have to stop his Plavix 5 days before the procedure.  Please let the patient know that this is a reasonable thing for him to do.  There is a slight risk with stopping Plavix that a heart attack could occur but the likelihood of this would be low.  Therefore in this situation the benefit of the test outweighs the risk of stopping the medicine 5 days in advance.  If he is in agreement with that then please prepare a handwritten letter stating that he could stop his Plavix 5 days before arthrogram procedure which is scheduled to occur on 6 February  Dr Nicki Reaper  will sign this letter then we will fax it to [place] scheduling department Fax number [phone]  If he does not want to stop the medicine then he will need to discuss with orthopedics because they will not do the arthrogram  Patient verbalized understanding and stated he is willing to stop medication and proceed with the test

## 2018-05-29 NOTE — Telephone Encounter (Signed)
I called left a message to r/c. 

## 2018-05-31 ENCOUNTER — Encounter: Payer: Self-pay | Admitting: Family Medicine

## 2018-05-31 ENCOUNTER — Telehealth: Payer: Self-pay | Admitting: Family Medicine

## 2018-05-31 NOTE — Telephone Encounter (Signed)
In your box needing back today if possible please

## 2018-05-31 NOTE — Telephone Encounter (Signed)
Please advise 

## 2018-05-31 NOTE — Telephone Encounter (Signed)
Please advise and send letter to [place]. Thank you

## 2018-05-31 NOTE — Telephone Encounter (Signed)
Nurses I dictated a letter Please send a letter to [facility] imaging

## 2018-05-31 NOTE — Telephone Encounter (Signed)
Please forward to [facility] imaging

## 2018-05-31 NOTE — Telephone Encounter (Signed)
[facility] needing signature for permission for patient to stop plaxis for 5 days need ASAP second fax first one was on 1/22

## 2018-06-01 NOTE — Telephone Encounter (Signed)
Nurse faxed over yesterday to [place] imaging.

## 2018-06-01 NOTE — Telephone Encounter (Signed)
Faxed 1/29 by the nurse to [place].

## 2018-06-08 ENCOUNTER — Other Ambulatory Visit: Payer: Medicare Other

## 2018-06-08 ENCOUNTER — Inpatient Hospital Stay: Admission: RE | Admit: 2018-06-08 | Payer: Medicare Other | Source: Ambulatory Visit

## 2018-06-12 ENCOUNTER — Telehealth: Payer: Self-pay | Admitting: Family Medicine

## 2018-06-12 ENCOUNTER — Other Ambulatory Visit: Payer: Self-pay | Admitting: Family Medicine

## 2018-06-12 DIAGNOSIS — R42 Dizziness and giddiness: Secondary | ICD-10-CM

## 2018-06-12 DIAGNOSIS — H938X3 Other specified disorders of ear, bilateral: Secondary | ICD-10-CM

## 2018-06-12 NOTE — Telephone Encounter (Signed)
Pt would like a new referral to ENT he does not want to see Dr. Benjamine Mola. He will drive to [place] if necessary. Pt is requesting the referral for ears popping, stopped up, pain and having dizzy spells .  Pt would like to be called in regards to referral when it is sent

## 2018-06-12 NOTE — Telephone Encounter (Signed)
Referral placed. Contacted patient and informed patient that referral has been placed and that the referral specialist will work on that when she is back in office. Pt verbalized understanding.

## 2018-06-12 NOTE — Telephone Encounter (Signed)
Dr [facility] ENT Dr. Erik Obey Dr. Wilburn Cornelia etc.

## 2018-06-13 ENCOUNTER — Other Ambulatory Visit: Payer: Self-pay | Admitting: Family Medicine

## 2018-06-15 ENCOUNTER — Telehealth: Payer: Self-pay | Admitting: Family Medicine

## 2018-06-15 NOTE — Telephone Encounter (Signed)
Patient checking on referral to ENT.  Wanted to let us know his ears have started to hurt and would like an appt as soon as possible.

## 2018-06-16 ENCOUNTER — Ambulatory Visit
Admission: RE | Admit: 2018-06-16 | Discharge: 2018-06-16 | Disposition: A | Discharge status: Home / Self Care | Payer: Medicare Other | Source: Ambulatory Visit | Attending: Specialist | Admitting: Specialist

## 2018-06-16 DIAGNOSIS — M25512 Pain in left shoulder: Secondary | ICD-10-CM

## 2018-06-16 DIAGNOSIS — M75122 Complete rotator cuff tear or rupture of left shoulder, not specified as traumatic: Secondary | ICD-10-CM | POA: Diagnosis not present

## 2018-06-16 MED ORDER — IOPAMIDOL (ISOVUE-M 200) INJECTION 41%
13.0000 mL | Freq: Once | INTRAMUSCULAR | Status: AC
  Administered 2018-06-16: 13 mL via INTRA_ARTICULAR

## 2018-06-19 ENCOUNTER — Encounter: Payer: Self-pay | Admitting: Family Medicine

## 2018-06-20 ENCOUNTER — Ambulatory Visit: Payer: Medicare Other | Admitting: Cardiology

## 2018-06-22 DIAGNOSIS — M7542 Impingement syndrome of left shoulder: Secondary | ICD-10-CM | POA: Diagnosis not present

## 2018-06-22 DIAGNOSIS — E109 Type 1 diabetes mellitus without complications: Secondary | ICD-10-CM | POA: Diagnosis not present

## 2018-06-22 DIAGNOSIS — M75122 Complete rotator cuff tear or rupture of left shoulder, not specified as traumatic: Secondary | ICD-10-CM | POA: Diagnosis not present

## 2018-06-23 ENCOUNTER — Ambulatory Visit: Payer: Medicare Other | Admitting: Cardiology

## 2018-06-27 ENCOUNTER — Ambulatory Visit (INDEPENDENT_AMBULATORY_CARE_PROVIDER_SITE_OTHER): Payer: Medicare Other | Admitting: Family Medicine

## 2018-06-27 ENCOUNTER — Encounter: Payer: Self-pay | Admitting: Family Medicine

## 2018-06-27 VITALS — BP 116/72 | Temp 99.0°F | Ht 69.0 in | Wt 221.4 lb

## 2018-06-27 DIAGNOSIS — I10 Essential (primary) hypertension: Secondary | ICD-10-CM

## 2018-06-27 NOTE — Progress Notes (Signed)
   Subjective:    Patient ID: Lance [name], male    DOB: 10-Mar-1946, 73 y.o.   MRN: [id number]  Hypertension  Chronicity: follow up. med increased at last visit. Pertinent negatives include no chest pain, headaches or shortness of breath.   Bilateral ear ringing and sharp pain in ears. Dizziness when turning. Has appt with Dr. Benjamine Mola on March 3rd.   Needs surgical clearance for left shoulder rotator cuff repair.    Review of Systems  Constitutional: Negative for activity change, fatigue and fever.  HENT: Negative for congestion and rhinorrhea.   Respiratory: Negative for cough and shortness of breath.   Cardiovascular: Negative for chest pain and leg swelling.  Gastrointestinal: Negative for abdominal pain, diarrhea and nausea.  Genitourinary: Negative for dysuria and hematuria.  Neurological: Negative for weakness and headaches.  Psychiatric/Behavioral: Negative for agitation and behavioral problems.       Objective:   Physical Exam Vitals signs reviewed.  Cardiovascular:     Rate and Rhythm: Normal rate and regular rhythm.     Heart sounds: Normal heart sounds. No murmur.  Pulmonary:     Effort: Pulmonary effort is normal.     Breath sounds: Normal breath sounds.  Lymphadenopathy:     Cervical: No cervical adenopathy.  Neurological:     Mental Status: He is alert.  Psychiatric:        Behavior: Behavior normal.           Assessment & Plan:  As for the ringing in the ears I would recommend seeing the ENT coming up as already scheduled more than likely this is related to hearing loss  As for the left shoulder from a medical point of view he is surgically cleared for his surgery.  But he will need cardiac clearance he has an appointment on March 9 he was encouraged to keep this he was also encouraged to touch base with his diabetic doctor regarding clearance from them as well.

## 2018-06-28 ENCOUNTER — Emergency Department (HOSPITAL_COMMUNITY)
Admission: EM | Admit: 2018-06-28 | Discharge: 2018-06-28 | Disposition: A | Discharge status: Home / Self Care | Payer: Medicare Other | Attending: Emergency Medicine | Admitting: Emergency Medicine

## 2018-06-28 ENCOUNTER — Emergency Department (HOSPITAL_COMMUNITY): Payer: Medicare Other

## 2018-06-28 ENCOUNTER — Encounter (HOSPITAL_COMMUNITY): Payer: Self-pay

## 2018-06-28 ENCOUNTER — Other Ambulatory Visit: Payer: Self-pay

## 2018-06-28 DIAGNOSIS — H81399 Other peripheral vertigo, unspecified ear: Secondary | ICD-10-CM | POA: Insufficient documentation

## 2018-06-28 DIAGNOSIS — I509 Heart failure, unspecified: Secondary | ICD-10-CM | POA: Insufficient documentation

## 2018-06-28 DIAGNOSIS — Z79899 Other long term (current) drug therapy: Secondary | ICD-10-CM | POA: Diagnosis not present

## 2018-06-28 DIAGNOSIS — E119 Type 2 diabetes mellitus without complications: Secondary | ICD-10-CM | POA: Diagnosis not present

## 2018-06-28 DIAGNOSIS — R112 Nausea with vomiting, unspecified: Secondary | ICD-10-CM | POA: Diagnosis not present

## 2018-06-28 DIAGNOSIS — I110 Hypertensive heart disease with heart failure: Secondary | ICD-10-CM | POA: Diagnosis not present

## 2018-06-28 DIAGNOSIS — Z87891 Personal history of nicotine dependence: Secondary | ICD-10-CM | POA: Diagnosis not present

## 2018-06-28 DIAGNOSIS — I2510 Atherosclerotic heart disease of native coronary artery without angina pectoris: Secondary | ICD-10-CM | POA: Insufficient documentation

## 2018-06-28 DIAGNOSIS — R531 Weakness: Secondary | ICD-10-CM | POA: Diagnosis not present

## 2018-06-28 DIAGNOSIS — R42 Dizziness and giddiness: Secondary | ICD-10-CM | POA: Diagnosis not present

## 2018-06-28 LAB — URINALYSIS, ROUTINE W REFLEX MICROSCOPIC
Bacteria, UA: NONE SEEN
Bilirubin Urine: NEGATIVE
Glucose, UA: >=500 mg/dL — AB
Hgb urine dipstick: NEGATIVE
Ketones, ur: NEGATIVE mg/dL
Leukocytes,Ua: NEGATIVE
Nitrite: NEGATIVE
Protein, ur: NEGATIVE mg/dL
Specific Gravity, Urine: 1.029 (ref 1.005–1.030)
pH: 6 (ref 5.0–8.0)

## 2018-06-28 LAB — COMPREHENSIVE METABOLIC PANEL
ALT: 22 U/L (ref 0–44)
AST: 19 U/L (ref 15–41)
Albumin: 3.6 g/dL (ref 3.5–5.0)
Alkaline Phosphatase: 63 U/L (ref 38–126)
Anion gap: 9 (ref 5–15)
BUN: 11 mg/dL (ref 8–23)
CO2: 25 mmol/L (ref 22–32)
Calcium: 8.7 mg/dL — ABNORMAL LOW (ref 8.9–10.3)
Chloride: 106 mmol/L (ref 98–111)
Creatinine, Ser: 0.96 mg/dL (ref 0.61–1.24)
GFR calc Af Amer: >60 mL/min (ref >60)
GFR calc non Af Amer: >60 mL/min (ref >60)
Glucose, Bld: 239 mg/dL — ABNORMAL HIGH (ref 70–99)
Potassium: 4 mmol/L (ref 3.5–5.1)
Sodium: 140 mmol/L (ref 135–145)
Total Bilirubin: 0.4 mg/dL (ref 0.3–1.2)
Total Protein: 6.2 g/dL — ABNORMAL LOW (ref 6.5–8.1)

## 2018-06-28 LAB — CBC WITH DIFFERENTIAL/PLATELET
Abs Immature Granulocytes: 0.03 10*3/uL (ref 0.00–0.07)
Basophils Absolute: 0 10*3/uL (ref 0.0–0.1)
Basophils Relative: 1 %
Eosinophils Absolute: 0.1 10*3/uL (ref 0.0–0.5)
Eosinophils Relative: 1 %
HCT: 41.7 % (ref 39.0–52.0)
Hemoglobin: 13 g/dL (ref 13.0–17.0)
Immature Granulocytes: 1 %
Lymphocytes Relative: 23 %
Lymphs Abs: 1.4 10*3/uL (ref 0.7–4.0)
MCH: 27.9 pg (ref 26.0–34.0)
MCHC: 31.2 g/dL (ref 30.0–36.0)
MCV: 89.5 fL (ref 80.0–100.0)
Monocytes Absolute: 0.7 10*3/uL (ref 0.1–1.0)
Monocytes Relative: 11 %
Neutro Abs: 4 10*3/uL (ref 1.7–7.7)
Neutrophils Relative %: 63 %
Platelets: 150 10*3/uL (ref 150–400)
RBC: 4.66 MIL/uL (ref 4.22–5.81)
RDW: 14.5 % (ref 11.5–15.5)
WBC: 6.2 10*3/uL (ref 4.0–10.5)
nRBC: 0 % (ref 0.0–0.2)

## 2018-06-28 LAB — TROPONIN I: Troponin I: <0.03 ng/mL (ref <0.03)

## 2018-06-28 LAB — CBG MONITORING, ED: Glucose-Capillary: 202 mg/dL — ABNORMAL HIGH (ref 70–99)

## 2018-06-28 LAB — LIPASE, BLOOD: Lipase: 35 U/L (ref 11–51)

## 2018-06-28 MED ORDER — SODIUM CHLORIDE 0.9% FLUSH
3.0000 mL | Freq: Once | INTRAVENOUS | Status: AC
  Administered 2018-06-28: 3 mL via INTRAVENOUS

## 2018-06-28 MED ORDER — MECLIZINE HCL 25 MG PO TABS: 25.0000 mg | ORAL_TABLET | Freq: Three times a day (TID) | ORAL | 0 refills | Status: DC | PRN

## 2018-06-28 MED ORDER — ONDANSETRON HCL 4 MG/2ML IJ SOLN
4.0000 mg | INTRAMUSCULAR | Status: DC | PRN
  Administered 2018-06-28: 4 mg via INTRAVENOUS
  Filled 2018-06-28: qty 2

## 2018-06-28 MED ORDER — SODIUM CHLORIDE 0.9 % IV BOLUS
500.0000 mL | Freq: Once | INTRAVENOUS | Status: AC
  Administered 2018-06-28: 500 mL via INTRAVENOUS

## 2018-06-28 MED ORDER — DIAZEPAM 5 MG PO TABS
5.0000 mg | ORAL_TABLET | Freq: Once | ORAL | Status: AC
  Administered 2018-06-28: 5 mg via ORAL
  Filled 2018-06-28: qty 1

## 2018-06-28 MED ORDER — MECLIZINE HCL 12.5 MG PO TABS
25.0000 mg | ORAL_TABLET | Freq: Once | ORAL | Status: AC
  Administered 2018-06-28: 25 mg via ORAL
  Filled 2018-06-28: qty 2

## 2018-06-28 NOTE — ED Triage Notes (Signed)
Pt presents to ED with generalized weakness and dizziness which started about 2-3 weeks ago and states it got worse this morning. Pt denies numbness or any other symptoms

## 2018-06-28 NOTE — Discharge Instructions (Addendum)
Take the prescription as directed.  Move slowly when changing positions. Keep yourself hydrated. Call your regular medical doctor today to schedule a follow up appointment within the next 2 days. Call the ENT doctor today to confirm your previously scheduled follow up appointment next week.  Return to the Emergency Department immediately sooner if worsening.

## 2018-06-28 NOTE — ED Notes (Signed)
Patient given a urinal and advised we needed urine specimen.
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
## 2018-06-28 NOTE — ED Notes (Signed)
Patient only able to ambulate few steps from bed.  Patient stated "room is spinning".  Patient was dizzy upon sitting and standing.

## 2018-06-28 NOTE — ED Notes (Signed)
Pt ambulated in hallway with stand by nursing assistance. Pt ambulated with steady gait. Pt reported minimal dizziness during ambulation and that he felt better much better ambulating now compared to earlier.

## 2018-06-30 ENCOUNTER — Ambulatory Visit (INDEPENDENT_AMBULATORY_CARE_PROVIDER_SITE_OTHER): Payer: Medicare Other | Admitting: Family Medicine

## 2018-06-30 VITALS — BP 106/70 | Temp 98.9°F | Ht 69.0 in | Wt 221.0 lb

## 2018-06-30 DIAGNOSIS — I959 Hypotension, unspecified: Secondary | ICD-10-CM

## 2018-06-30 MED ORDER — LOSARTAN POTASSIUM 50 MG PO TABS: ORAL_TABLET | ORAL | 2 refills | Status: DC

## 2018-06-30 NOTE — Progress Notes (Signed)
   Subjective:    Patient ID: Lance [name], male    DOB: Jun 29, 1945, 73 y.o.   MRN: [id number]  HPIER follow up for vertigo. Prescribed antivert. Has helped some. Has up coming appt with DR. Teoh on Monday.   Patient has a lot of dizziness he states the other day he felt dizzy all day long to matter if he is standing sitting or moving now he states the dizziness occurs when he stands up he describes it as feeling lightheaded whereas earlier in the week he felt like the room was moving and spinning on him he does state some intermittent nausea but no vomiting  Review of Systems  Constitutional: Negative for diaphoresis and fatigue.  HENT: Negative for congestion and rhinorrhea.   Respiratory: Negative for cough and shortness of breath.   Cardiovascular: Negative for chest pain and leg swelling.  Gastrointestinal: Negative for abdominal pain and diarrhea.  Skin: Negative for color change and rash.  Neurological: Negative for dizziness and headaches.  Psychiatric/Behavioral: Negative for behavioral problems and confusion.       Objective:   Physical Exam Vitals signs reviewed.  Constitutional:      General: He is not in acute distress. HENT:     Head: Normocephalic and atraumatic.  Eyes:     General:        Right eye: No discharge.        Left eye: No discharge.  Neck:     Trachea: No tracheal deviation.  Cardiovascular:     Rate and Rhythm: Normal rate and regular rhythm.     Heart sounds: Normal heart sounds. No murmur.  Pulmonary:     Effort: Pulmonary effort is normal. No respiratory distress.     Breath sounds: Normal breath sounds.  Lymphadenopathy:     Cervical: No cervical adenopathy.  Skin:    General: Skin is warm and dry.  Neurological:     Mental Status: He is alert.     Coordination: Coordination normal.  Psychiatric:        Behavior: Behavior normal.   Blood pressure was checked sitting in a chair Blood pressure 106/70 sitting 96/62 standing Although he  may well have had inner ear earlier in the week I believe the problem currently today is more blood pressure oriented      Assessment & Plan:  Relatively low blood pressure I believe that this is contributing to his problem I recommend decreasing losartan current dose 50 mg new dose half of a tablet daily If he has ongoing troubles with this then it would be wise for him to be rechecked otherwise he will follow-up with [place] in several weeks he will see cardiology next week for follow-up on his blood pressure and he will see ENT on Monday for follow-up regarding the possible inner ear

## 2018-07-03 ENCOUNTER — Ambulatory Visit (INDEPENDENT_AMBULATORY_CARE_PROVIDER_SITE_OTHER): Payer: Medicare Other | Admitting: Otolaryngology

## 2018-07-03 DIAGNOSIS — H9209 Otalgia, unspecified ear: Secondary | ICD-10-CM

## 2018-07-03 DIAGNOSIS — R42 Dizziness and giddiness: Secondary | ICD-10-CM | POA: Diagnosis not present

## 2018-07-06 ENCOUNTER — Ambulatory Visit (INDEPENDENT_AMBULATORY_CARE_PROVIDER_SITE_OTHER): Payer: Medicare Other | Admitting: *Deleted

## 2018-07-06 DIAGNOSIS — I255 Ischemic cardiomyopathy: Secondary | ICD-10-CM | POA: Diagnosis not present

## 2018-07-06 NOTE — Progress Notes (Signed)
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 morning.    On the morning of your procedure, take your Aspirin and any morning medicines NOT listed above.  You may use sips of water.  5. Plan for one night stay--bring personal belongings. 6. Bring a current list of your medications and current insurance cards. 7. You MUST have a responsible person to drive you home. 8. Someone MUST be with you the first 24 hours after you arrive home or your discharge will be delayed. 9. Please wear clothes that are easy to get on and off and wear slip-on shoes.  Thank you for allowing [place] to care for you!   -- [facility]     Signed, Erma Heritage, PA-C  07/08/2018 1:35 PM    [facility] [address] [place], [place] [address] Phone: [phone] Fax: [phone]

## 2018-07-06 NOTE — H&P (View-Only) (Signed)
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 Insulin in the morning.    On the morning of your procedure, take your Aspirin and any morning medicines NOT listed above.  You may use sips of water.  5. Plan for one night stay--bring personal belongings. 6. Bring a current list of your medications and current insurance cards. 7. You MUST have a responsible person to drive you home. 8. Someone MUST be with you the first 24 hours after you arrive home or your discharge will be delayed. 9. Please wear clothes that are easy to get on and off and wear slip-on shoes.  Thank you for allowing [place] to care for you!   -- [facility]     Signed, Erma Heritage, PA-C  07/08/2018 1:35 PM    [facility] [address] [place], [place] [address] Phone: [phone] Fax: [phone]

## 2018-07-07 ENCOUNTER — Ambulatory Visit (INDEPENDENT_AMBULATORY_CARE_PROVIDER_SITE_OTHER): Payer: Medicare Other | Admitting: Student

## 2018-07-07 ENCOUNTER — Encounter: Payer: Self-pay | Admitting: Student

## 2018-07-07 VITALS — BP 140/72 | HR 65 | Ht 69.0 in | Wt 221.0 lb

## 2018-07-07 DIAGNOSIS — E785 Hyperlipidemia, unspecified: Secondary | ICD-10-CM | POA: Diagnosis not present

## 2018-07-07 DIAGNOSIS — I10 Essential (primary) hypertension: Secondary | ICD-10-CM

## 2018-07-07 DIAGNOSIS — I77819 Aortic ectasia, unspecified site: Secondary | ICD-10-CM

## 2018-07-07 DIAGNOSIS — Z01810 Encounter for preprocedural cardiovascular examination: Secondary | ICD-10-CM

## 2018-07-07 DIAGNOSIS — I25110 Atherosclerotic heart disease of native coronary artery with unstable angina pectoris: Secondary | ICD-10-CM

## 2018-07-07 DIAGNOSIS — I4901 Ventricular fibrillation: Secondary | ICD-10-CM | POA: Diagnosis not present

## 2018-07-07 NOTE — Patient Instructions (Addendum)
Medication Instructions:  Your physician recommends that you continue on your current medications as directed. Please refer to the Current Medication list given to you today.  If you need a refill on your cardiac medications before your next appointment, please call your pharmacy.   Lab work: NONE  If you have labs (blood work) drawn today and your tests are completely normal, you will receive your results only by: Marland Kitchen MyChart Message (if you have MyChart) OR . A paper copy in the mail If you have any lab test that is abnormal or we need to change your treatment, we will call you to review the results.  Testing/Procedures: Your physician has requested that you have a cardiac catheterization. Cardiac catheterization is used to diagnose and/or treat various heart conditions. Doctors may recommend this procedure for a number of different reasons. The most common reason is to evaluate chest pain. Chest pain can be a symptom of coronary artery disease (CAD), and cardiac catheterization can show whether plaque is narrowing or blocking your heart's arteries. This procedure is also used to evaluate the valves, as well as measure the blood flow and oxygen levels in different parts of your heart. For further information please visit [email or web address]. Please follow instruction sheet, as given.    Follow-Up: At [facility], you and your health needs are our priority.  As part of our continuing mission to provide you with exceptional heart care, we have created designated Provider Care Teams.  These Care Teams include your primary Cardiologist (physician) and Advanced Practice Providers (APPs -  Physician Assistants and Nurse Practitioners) who all work together to provide you with the care you need, when you need it. You will need a follow up appointment in 4-6 weeks.  Please call our office 2 months in advance to schedule this appointment.  You may see Carlyle Dolly, MD or one of the following  Advanced Practice Providers on your designated Care Team:   Bernerd Pho, PA-C [facility]) . Ermalinda Barrios, PA-C ([place])  Any Other Special Instructions Will Be Listed Below (If Applicable). Thank you for choosing [facility]!     [place] [place] [address] Dept: [phone] Loc: [phone]  Lance [name]  07/07/2018  You are scheduled for a Cardiac Catheterization on Thursday, March 12 with Dr. [name].  1. Please arrive at the [facility] (Main Entrance A) at [facility]: [address] [place], [place] [address] at 8:30 AM (This time is two hours before your procedure to ensure your preparation). Free valet parking service is available.   Special note: Every effort is made to have your procedure done on time. Please understand that emergencies sometimes delay scheduled procedures.  2. Diet: Do not eat solid foods after midnight.  The patient may have clear liquids until 5am upon the day of the procedure.  3. Labs: You will need to have blood drawn NONE NEEDED   4. Medication instructions in preparation for your procedure:   Contrast Allergy: No  Take Only 1/2 Insulin the night before. Do not take any Insulin in the morning.    On the morning of your procedure, take your Aspirin and any morning medicines NOT listed above.  You may use sips of water.  5. Plan for one night stay--bring personal belongings. 6. Bring a current list of your medications and current insurance cards. 7. You MUST have a responsible person to drive  you home. 8. Someone MUST be with you the first 24 hours after you arrive home or your discharge will be delayed. 9. Please wear clothes that are easy to get on and off and wear slip-on shoes.  Thank you for allowing [place] to care for you!   -- [facility]

## 2018-07-08 ENCOUNTER — Encounter: Payer: Self-pay | Admitting: Student

## 2018-07-08 LAB — CUP PACEART REMOTE DEVICE CHECK
Date Time Interrogation Session: 20200307083255
Implantable Lead Implant Date: 20050706
Implantable Lead Implant Date: 20161128
Implantable Lead Location: 753859
Implantable Lead Location: 753860
Implantable Lead Model: 7122
Implantable Pulse Generator Implant Date: 20130727
Pulse Gen Serial Number: 7041246

## 2018-07-11 ENCOUNTER — Telehealth: Payer: Self-pay | Admitting: *Deleted

## 2018-07-11 DIAGNOSIS — E1142 Type 2 diabetes mellitus with diabetic polyneuropathy: Secondary | ICD-10-CM | POA: Diagnosis not present

## 2018-07-11 DIAGNOSIS — B351 Tinea unguium: Secondary | ICD-10-CM | POA: Diagnosis not present

## 2018-07-11 IMAGING — CT CT L SPINE W/O CM
3 of 8 series · 12 of 33 positions shown, 14 images · non-contrast
Comparison: CT abdomen and pelvis 10/19/2011.

CLINICAL DATA: Low back and bilateral leg pain for 8 months. No
known injury.

EXAM:
CT LUMBAR SPINE WITHOUT CONTRAST
TECHNIQUE: Multidetector CT imaging of the lumbar spine was performed without
intravenous contrast administration. Multiplanar CT image
reconstructions were also generated.

[Series 2: l spine soft · axial · 0.32mm/px · z∈[-444,-264]mm · 4 of 85 slices shown, 5 images]
[im 13/85  soft-tissue]
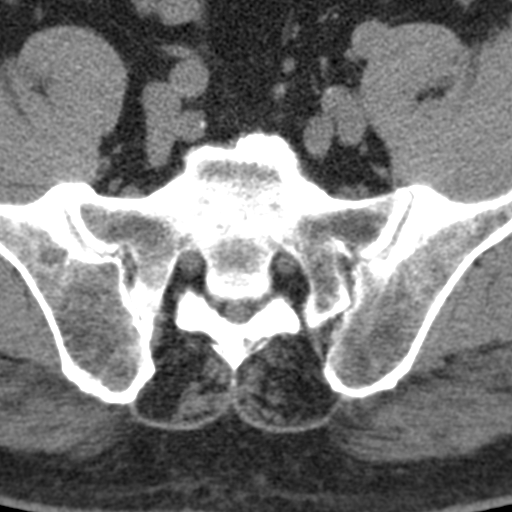
[im 13/85  bone]
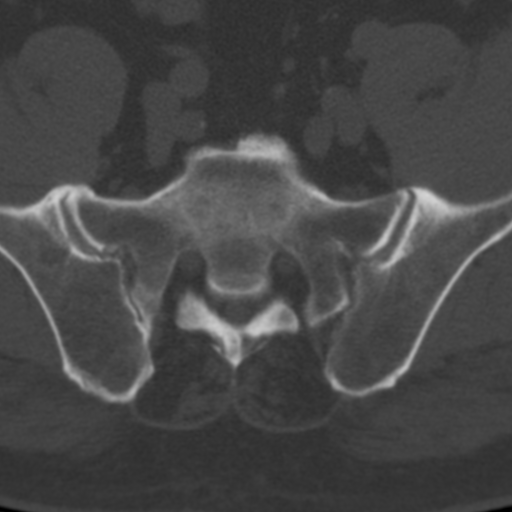
[im 37/85  bone]
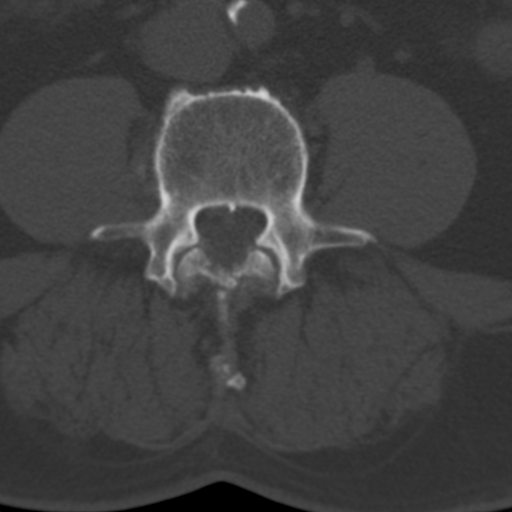
[im 49/85  bone]
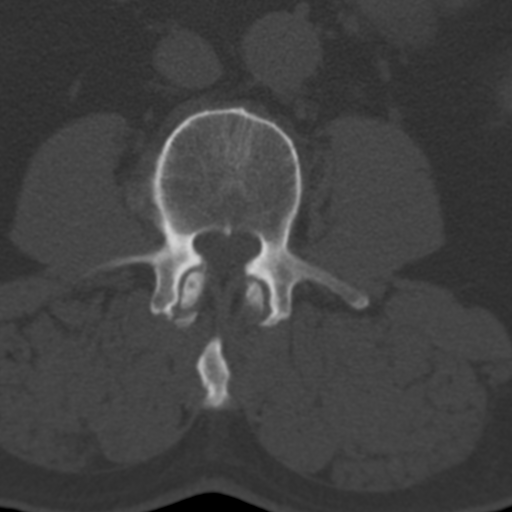
[im 73/85  bone]
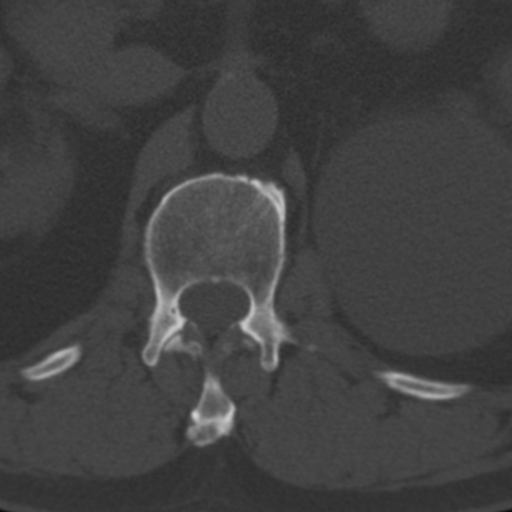

[Series 6: bone cor · coronal · 0.32mm/px · 3 of 54 slices shown]
[im 11/54  bone]
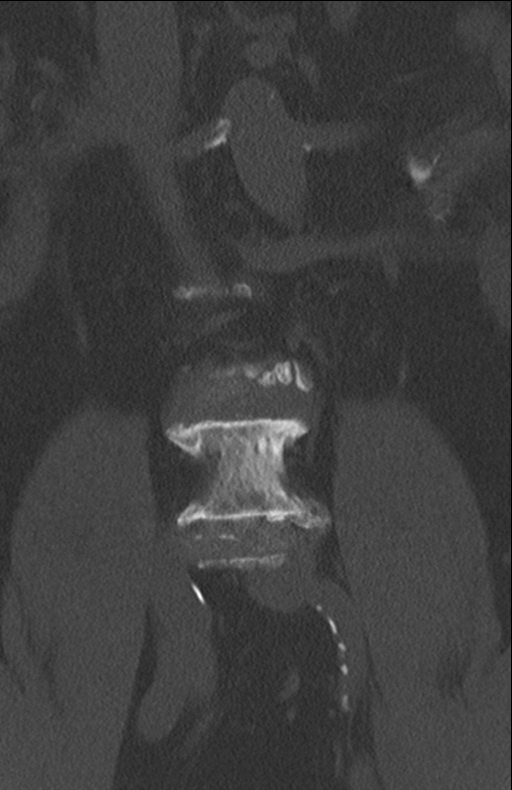
[im 22/54  bone]
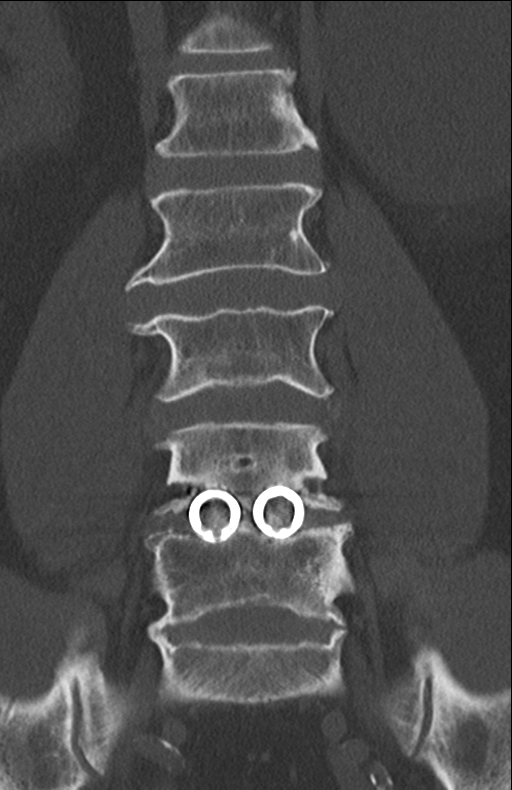
[im 32/54  bone]
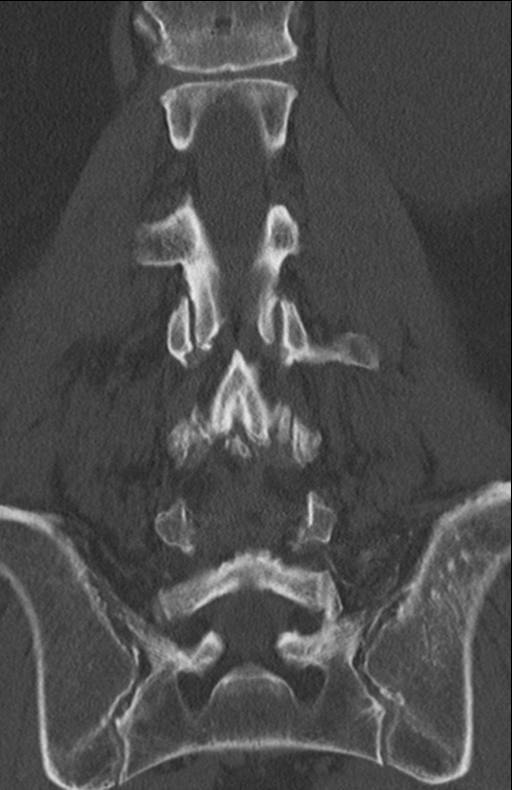

[Series 7: sag bone · sagittal · 0.32mm/px · 5 of 56 slices shown, 6 images]
[im 19/56  bone]
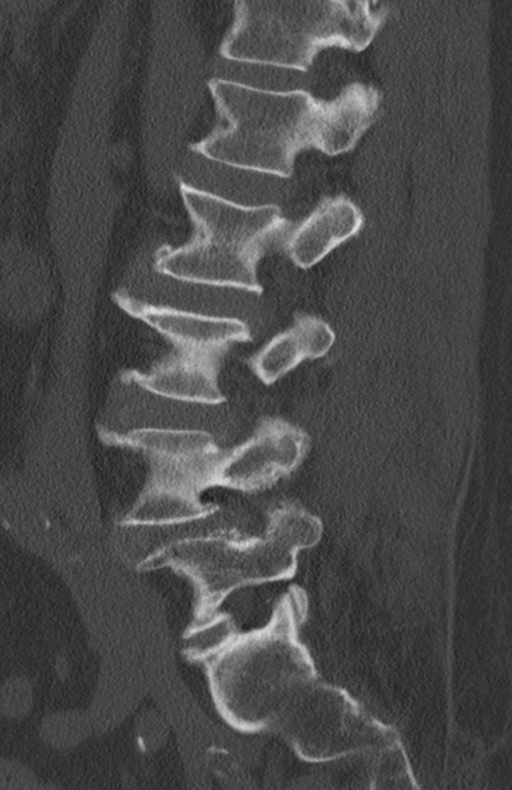
[im 23/56  bone]
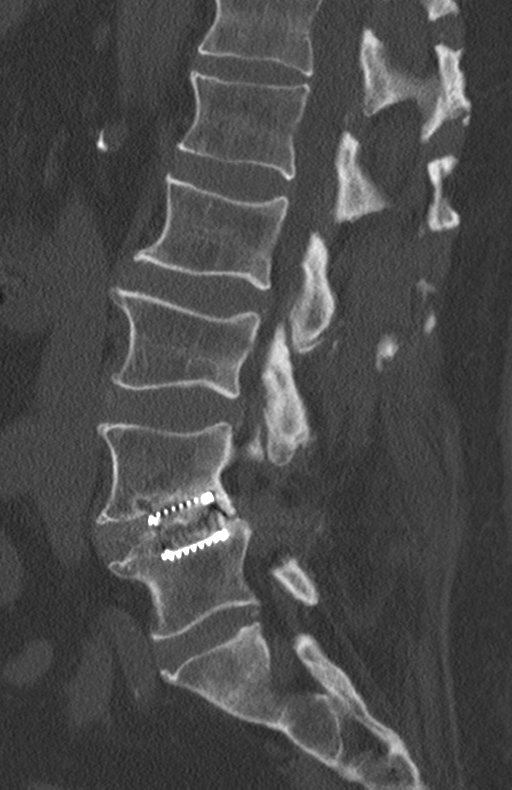
[im 28/56  soft-tissue]
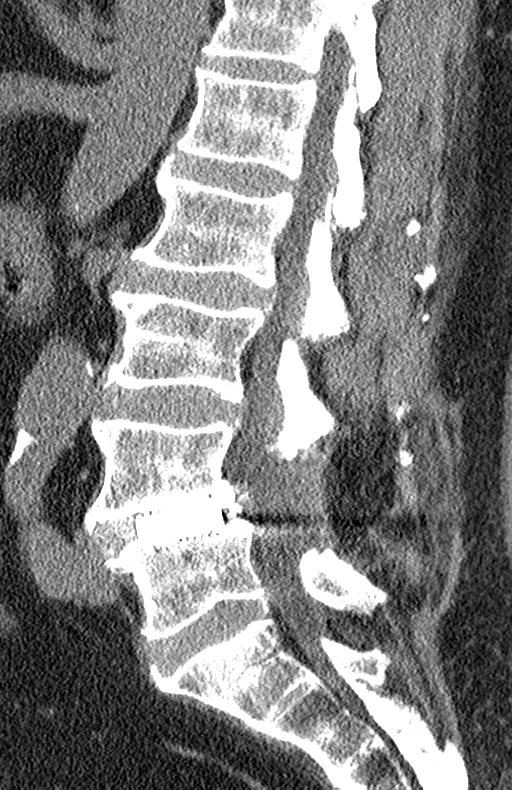
[im 28/56  bone]
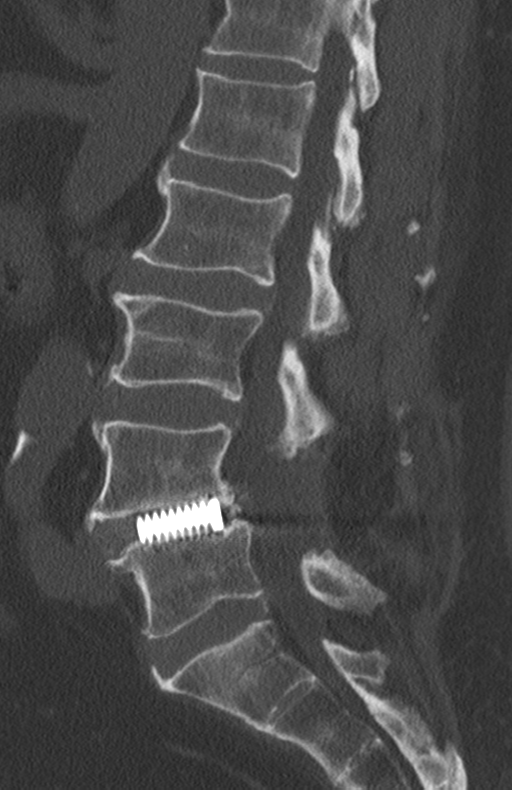
[im 33/56  bone]
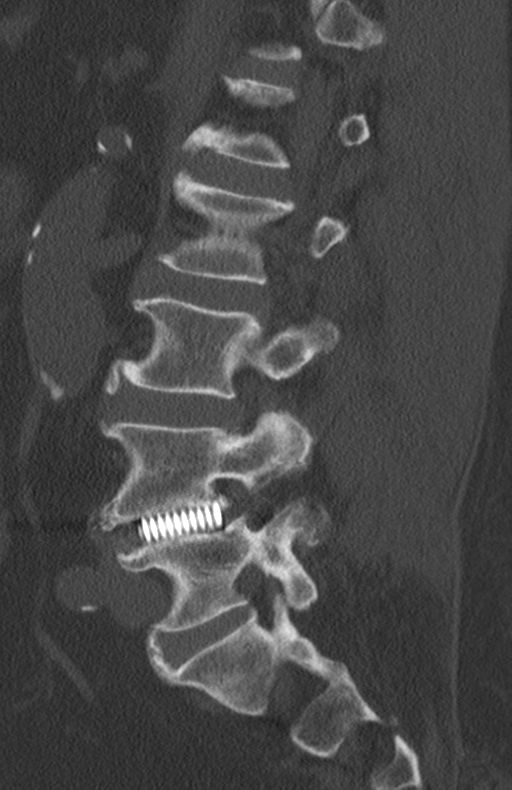
[im 37/56  bone]
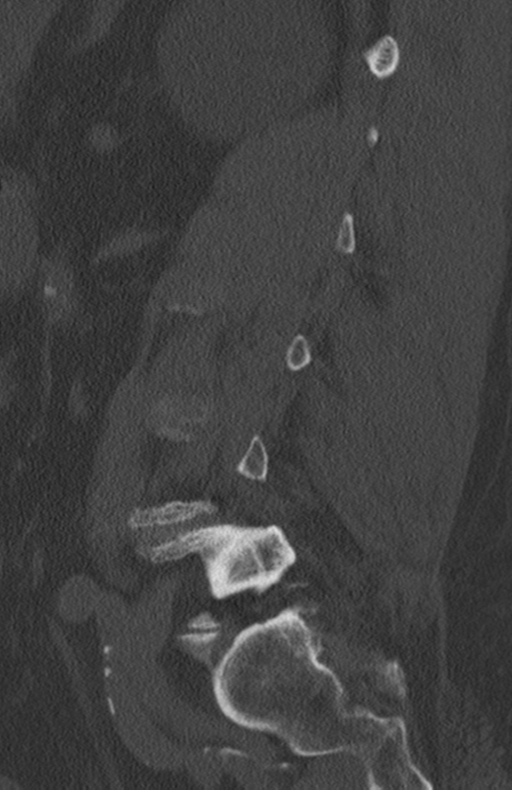

[12 of 33 positions shown; findings below may reference images not displayed]

FINDINGS: Segmentation: Standard.

Alignment: As seen on the prior CT scan, the patient is status post
L4-5 fusion with 2 ray cages in place. 0.4 cm anterolisthesis L4 on
L5 is unchanged. There is trace retrolisthesis L3 on L4, also
unchanged.

Vertebrae: No fracture or worrisome lesion.

Paraspinal and other soft tissues: Large right renal cyst is
unchanged. Retroaortic left renal vein is again seen. There is
aortoiliac atherosclerosis. Degenerative ankylosis of the SI joints
is unchanged.

Disc levels: T12-L1: Minimal disc bulge and endplate spur. The
central canal and foramina are open.

L1-2: Mild to moderate bilateral facet degenerative disease. No disc
bulge or protrusion. The central canal and foramina are open.

L2-3: Shallow disc bulge and moderate facet degenerative disease.
The central canal is mildly narrowed. The foramina appear open.

L3-4: Shallow broad-based disc bulge and ligamentum flavum
thickening cause moderate central canal stenosis. Moderate to
moderately severe bilateral foraminal narrowing is also identified.

L5-S1: Status post laminectomy, facetectomy and fusion. The central
canal and foramina appear open.

L5-S1:  Negative.
IMPRESSION: Disc bulge and ligamentum flavum thickening at L3-4 cause moderate
central canal stenosis and moderate to moderately severe bilateral
foraminal narrowing.

Status post L4-5 fusion.  The central canal and foramina are open.

Mild central canal narrowing L2-3 where there is a shallow disc
bulge and moderate facet arthropathy.

## 2018-07-11 NOTE — Telephone Encounter (Addendum)
Pt contacted pre-catheterization scheduled at [facility] for: Thursday July 13, 2018 10:30 AM Verified arrival time and place: [facility] Entrance A at: 8:30 AM  No solid food after midnight prior to cath, clear liquids until 5 AM day of procedure. Contrast allergy: no  Hold: Insulin-AM of procedure. 1/2 Insulin-PM prior to procedure.  Jardiance-AM of procedure.  Except hold medications AM meds can be  taken pre-cath with sip of water including: ASA 81 mg Clopidogrel 75 mg   Confirm patient has responsible person to drive home post procedure and observe 24 hours after arriving home.  I reviewed instructions with patient, he verbalized understanding, thanked me for call.

## 2018-07-13 ENCOUNTER — Encounter (HOSPITAL_COMMUNITY): Payer: Self-pay | Admitting: General Practice

## 2018-07-13 ENCOUNTER — Other Ambulatory Visit: Payer: Self-pay

## 2018-07-13 ENCOUNTER — Encounter (HOSPITAL_COMMUNITY)
Admission: RE | Disposition: A | Discharge status: Home / Self Care | Payer: Self-pay | Source: Home / Self Care | Attending: Interventional Cardiology

## 2018-07-13 ENCOUNTER — Ambulatory Visit (HOSPITAL_COMMUNITY)
Admission: RE | Admit: 2018-07-13 | Discharge: 2018-07-14 | Disposition: A | Discharge status: Home / Self Care | Payer: Medicare Other | Attending: Interventional Cardiology | Admitting: Interventional Cardiology

## 2018-07-13 DIAGNOSIS — Z8249 Family history of ischemic heart disease and other diseases of the circulatory system: Secondary | ICD-10-CM | POA: Insufficient documentation

## 2018-07-13 DIAGNOSIS — K219 Gastro-esophageal reflux disease without esophagitis: Secondary | ICD-10-CM | POA: Diagnosis not present

## 2018-07-13 DIAGNOSIS — Z794 Long term (current) use of insulin: Secondary | ICD-10-CM | POA: Diagnosis not present

## 2018-07-13 DIAGNOSIS — I25118 Atherosclerotic heart disease of native coronary artery with other forms of angina pectoris: Secondary | ICD-10-CM | POA: Insufficient documentation

## 2018-07-13 DIAGNOSIS — Z8673 Personal history of transient ischemic attack (TIA), and cerebral infarction without residual deficits: Secondary | ICD-10-CM | POA: Insufficient documentation

## 2018-07-13 DIAGNOSIS — Z881 Allergy status to other antibiotic agents status: Secondary | ICD-10-CM | POA: Insufficient documentation

## 2018-07-13 DIAGNOSIS — Z955 Presence of coronary angioplasty implant and graft: Secondary | ICD-10-CM | POA: Diagnosis not present

## 2018-07-13 DIAGNOSIS — H9190 Unspecified hearing loss, unspecified ear: Secondary | ICD-10-CM | POA: Insufficient documentation

## 2018-07-13 DIAGNOSIS — I5032 Chronic diastolic (congestive) heart failure: Secondary | ICD-10-CM | POA: Diagnosis not present

## 2018-07-13 DIAGNOSIS — I2584 Coronary atherosclerosis due to calcified coronary lesion: Secondary | ICD-10-CM | POA: Insufficient documentation

## 2018-07-13 DIAGNOSIS — J449 Chronic obstructive pulmonary disease, unspecified: Secondary | ICD-10-CM | POA: Insufficient documentation

## 2018-07-13 DIAGNOSIS — Z87891 Personal history of nicotine dependence: Secondary | ICD-10-CM | POA: Diagnosis not present

## 2018-07-13 DIAGNOSIS — R0609 Other forms of dyspnea: Secondary | ICD-10-CM | POA: Insufficient documentation

## 2018-07-13 DIAGNOSIS — I2510 Atherosclerotic heart disease of native coronary artery without angina pectoris: Secondary | ICD-10-CM | POA: Diagnosis present

## 2018-07-13 DIAGNOSIS — Z833 Family history of diabetes mellitus: Secondary | ICD-10-CM | POA: Insufficient documentation

## 2018-07-13 DIAGNOSIS — N400 Enlarged prostate without lower urinary tract symptoms: Secondary | ICD-10-CM | POA: Diagnosis not present

## 2018-07-13 DIAGNOSIS — Z888 Allergy status to other drugs, medicaments and biological substances status: Secondary | ICD-10-CM | POA: Insufficient documentation

## 2018-07-13 DIAGNOSIS — Z8674 Personal history of sudden cardiac arrest: Secondary | ICD-10-CM | POA: Insufficient documentation

## 2018-07-13 DIAGNOSIS — I252 Old myocardial infarction: Secondary | ICD-10-CM | POA: Insufficient documentation

## 2018-07-13 DIAGNOSIS — Z951 Presence of aortocoronary bypass graft: Secondary | ICD-10-CM | POA: Diagnosis not present

## 2018-07-13 DIAGNOSIS — Z7982 Long term (current) use of aspirin: Secondary | ICD-10-CM | POA: Insufficient documentation

## 2018-07-13 DIAGNOSIS — Z79899 Other long term (current) drug therapy: Secondary | ICD-10-CM | POA: Insufficient documentation

## 2018-07-13 DIAGNOSIS — N529 Male erectile dysfunction, unspecified: Secondary | ICD-10-CM | POA: Diagnosis not present

## 2018-07-13 DIAGNOSIS — M1990 Unspecified osteoarthritis, unspecified site: Secondary | ICD-10-CM | POA: Diagnosis not present

## 2018-07-13 DIAGNOSIS — E1140 Type 2 diabetes mellitus with diabetic neuropathy, unspecified: Secondary | ICD-10-CM | POA: Insufficient documentation

## 2018-07-13 DIAGNOSIS — Z885 Allergy status to narcotic agent status: Secondary | ICD-10-CM | POA: Insufficient documentation

## 2018-07-13 DIAGNOSIS — I110 Hypertensive heart disease with heart failure: Secondary | ICD-10-CM | POA: Diagnosis not present

## 2018-07-13 DIAGNOSIS — Z7902 Long term (current) use of antithrombotics/antiplatelets: Secondary | ICD-10-CM | POA: Insufficient documentation

## 2018-07-13 DIAGNOSIS — Z9861 Coronary angioplasty status: Secondary | ICD-10-CM

## 2018-07-13 DIAGNOSIS — Z91040 Latex allergy status: Secondary | ICD-10-CM | POA: Insufficient documentation

## 2018-07-13 DIAGNOSIS — E785 Hyperlipidemia, unspecified: Secondary | ICD-10-CM | POA: Insufficient documentation

## 2018-07-13 DIAGNOSIS — Z880 Allergy status to penicillin: Secondary | ICD-10-CM | POA: Diagnosis not present

## 2018-07-13 DIAGNOSIS — I25119 Atherosclerotic heart disease of native coronary artery with unspecified angina pectoris: Secondary | ICD-10-CM | POA: Diagnosis present

## 2018-07-13 HISTORY — PX: LEFT HEART CATH AND CORS/GRAFTS ANGIOGRAPHY: CATH118250

## 2018-07-13 HISTORY — PX: CORONARY BALLOON ANGIOPLASTY: CATH118233

## 2018-07-13 LAB — POCT ACTIVATED CLOTTING TIME
Activated Clotting Time: 268 seconds
Activated Clotting Time: 340 seconds
Activated Clotting Time: 191 seconds

## 2018-07-13 LAB — GLUCOSE, CAPILLARY
Glucose-Capillary: 250 mg/dL — ABNORMAL HIGH (ref 70–99)
Glucose-Capillary: 125 mg/dL — ABNORMAL HIGH (ref 70–99)
Glucose-Capillary: 190 mg/dL — ABNORMAL HIGH (ref 70–99)

## 2018-07-13 SURGERY — LEFT HEART CATH AND CORS/GRAFTS ANGIOGRAPHY
Anesthesia: LOCAL

## 2018-07-13 MED ORDER — FENTANYL CITRATE (PF) 100 MCG/2ML IJ SOLN
INTRAMUSCULAR | Status: AC
  Filled 2018-07-13: qty 2

## 2018-07-13 MED ORDER — ASPIRIN EC 81 MG PO TBEC
81.0000 mg | DELAYED_RELEASE_TABLET | Freq: Every day | ORAL | Status: DC
  Administered 2018-07-14: 10:00:00 81 mg via ORAL
  Filled 2018-07-13: qty 1

## 2018-07-13 MED ORDER — SODIUM CHLORIDE 0.9% FLUSH
3.0000 mL | Freq: Two times a day (BID) | INTRAVENOUS | Status: DC
  Administered 2018-07-13 – 2018-07-14 (×2): 3 mL via INTRAVENOUS

## 2018-07-13 MED ORDER — IOHEXOL 350 MG/ML SOLN
INTRAVENOUS | Status: DC | PRN
  Administered 2018-07-13: 135 mL via INTRA_ARTERIAL

## 2018-07-13 MED ORDER — LABETALOL HCL 5 MG/ML IV SOLN: 10.0000 mg | INTRAVENOUS | Status: AC | PRN

## 2018-07-13 MED ORDER — ALBUTEROL SULFATE HFA 108 (90 BASE) MCG/ACT IN AERS: 2.0000 | INHALATION_SPRAY | Freq: Four times a day (QID) | RESPIRATORY_TRACT | Status: DC | PRN

## 2018-07-13 MED ORDER — SODIUM CHLORIDE 0.9% FLUSH: 3.0000 mL | Freq: Two times a day (BID) | INTRAVENOUS | Status: DC

## 2018-07-13 MED ORDER — SODIUM CHLORIDE 0.9 % IV SOLN: 250.0000 mL | INTRAVENOUS | Status: DC | PRN

## 2018-07-13 MED ORDER — ALPRAZOLAM 0.5 MG PO TABS
0.5000 mg | ORAL_TABLET | Freq: Two times a day (BID) | ORAL | Status: DC | PRN
  Administered 2018-07-13: 22:00:00 0.5 mg via ORAL
  Filled 2018-07-13: qty 1

## 2018-07-13 MED ORDER — HEPARIN SODIUM (PORCINE) 1000 UNIT/ML IJ SOLN
INTRAMUSCULAR | Status: DC | PRN
  Administered 2018-07-13: 9000 [IU] via INTRAVENOUS
  Administered 2018-07-13: 3000 [IU] via INTRAVENOUS

## 2018-07-13 MED ORDER — MIDAZOLAM HCL 2 MG/2ML IJ SOLN
INTRAMUSCULAR | Status: DC | PRN
  Administered 2018-07-13: 1 mg via INTRAVENOUS
  Administered 2018-07-13: 2 mg via INTRAVENOUS

## 2018-07-13 MED ORDER — ASPIRIN 81 MG PO CHEW: 81.0000 mg | CHEWABLE_TABLET | ORAL | Status: DC

## 2018-07-13 MED ORDER — SODIUM CHLORIDE 0.9 % WEIGHT BASED INFUSION: 1.0000 mL/kg/h | INTRAVENOUS | Status: DC

## 2018-07-13 MED ORDER — DONEPEZIL HCL 10 MG PO TABS
10.0000 mg | ORAL_TABLET | Freq: Every day | ORAL | Status: DC
  Administered 2018-07-13: 10 mg via ORAL
  Filled 2018-07-13: qty 1

## 2018-07-13 MED ORDER — METOPROLOL TARTRATE 25 MG PO TABS
25.0000 mg | ORAL_TABLET | Freq: Two times a day (BID) | ORAL | Status: DC
  Administered 2018-07-13 – 2018-07-14 (×2): 25 mg via ORAL
  Filled 2018-07-13 (×2): qty 1

## 2018-07-13 MED ORDER — SODIUM CHLORIDE 0.9% FLUSH: 3.0000 mL | INTRAVENOUS | Status: DC | PRN

## 2018-07-13 MED ORDER — ACETAMINOPHEN 325 MG PO TABS: 650.0000 mg | ORAL_TABLET | ORAL | Status: DC | PRN

## 2018-07-13 MED ORDER — TAMSULOSIN HCL 0.4 MG PO CAPS
0.4000 mg | ORAL_CAPSULE | Freq: Every day | ORAL | Status: DC
  Administered 2018-07-13: 0.4 mg via ORAL
  Filled 2018-07-13: qty 1

## 2018-07-13 MED ORDER — ONDANSETRON HCL 4 MG/2ML IJ SOLN: 4.0000 mg | Freq: Four times a day (QID) | INTRAMUSCULAR | Status: DC | PRN

## 2018-07-13 MED ORDER — ATROPINE SULFATE 1 MG/10ML IJ SOSY
PREFILLED_SYRINGE | INTRAMUSCULAR | Status: AC
  Filled 2018-07-13: qty 10

## 2018-07-13 MED ORDER — HEPARIN (PORCINE) IN NACL 1000-0.9 UT/500ML-% IV SOLN
INTRAVENOUS | Status: DC | PRN
  Administered 2018-07-13: 500 mL

## 2018-07-13 MED ORDER — GUAIFENESIN ER 600 MG PO TB12
1200.0000 mg | ORAL_TABLET | Freq: Two times a day (BID) | ORAL | Status: DC
  Administered 2018-07-13 – 2018-07-14 (×2): 1200 mg via ORAL
  Filled 2018-07-13 (×2): qty 2

## 2018-07-13 MED ORDER — ANGIOPLASTY BOOK
Freq: Once | Status: AC
  Administered 2018-07-13: 18:00:00

## 2018-07-13 MED ORDER — LORATADINE 10 MG PO TABS
10.0000 mg | ORAL_TABLET | Freq: Every day | ORAL | Status: DC
  Administered 2018-07-14: 10 mg via ORAL
  Filled 2018-07-13: qty 1

## 2018-07-13 MED ORDER — MOMETASONE FURO-FORMOTEROL FUM 200-5 MCG/ACT IN AERO
2.0000 | INHALATION_SPRAY | Freq: Two times a day (BID) | RESPIRATORY_TRACT | Status: DC
  Filled 2018-07-13: qty 8.8

## 2018-07-13 MED ORDER — ASPIRIN 81 MG PO CHEW: 81.0000 mg | CHEWABLE_TABLET | Freq: Every day | ORAL | Status: DC

## 2018-07-13 MED ORDER — INSULIN LISPRO (1 UNIT DIAL) 100 UNIT/ML (KWIKPEN): 30.0000 [IU] | PEN_INJECTOR | Freq: Three times a day (TID) | SUBCUTANEOUS | Status: DC

## 2018-07-13 MED ORDER — HEPARIN SODIUM (PORCINE) 1000 UNIT/ML IJ SOLN
INTRAMUSCULAR | Status: AC
  Filled 2018-07-13: qty 1

## 2018-07-13 MED ORDER — HYDRALAZINE HCL 20 MG/ML IJ SOLN: 5.0000 mg | INTRAMUSCULAR | Status: AC | PRN

## 2018-07-13 MED ORDER — NITROGLYCERIN 0.4 MG SL SUBL: 0.4000 mg | SUBLINGUAL_TABLET | SUBLINGUAL | Status: DC | PRN

## 2018-07-13 MED ORDER — FENTANYL CITRATE (PF) 100 MCG/2ML IJ SOLN
INTRAMUSCULAR | Status: DC | PRN
  Administered 2018-07-13 (×2): 25 ug via INTRAVENOUS

## 2018-07-13 MED ORDER — CANAGLIFLOZIN 100 MG PO TABS: 100.0000 mg | ORAL_TABLET | Freq: Every day | ORAL | Status: DC

## 2018-07-13 MED ORDER — MIDAZOLAM HCL 2 MG/2ML IJ SOLN
INTRAMUSCULAR | Status: AC
  Filled 2018-07-13: qty 2

## 2018-07-13 MED ORDER — SODIUM CHLORIDE 0.9 % WEIGHT BASED INFUSION
3.0000 mL/kg/h | INTRAVENOUS | Status: DC
  Administered 2018-07-13: 3 mL/kg/h via INTRAVENOUS

## 2018-07-13 MED ORDER — HEPARIN (PORCINE) IN NACL 1000-0.9 UT/500ML-% IV SOLN
INTRAVENOUS | Status: AC
  Filled 2018-07-13: qty 500

## 2018-07-13 MED ORDER — INSULIN DETEMIR 100 UNIT/ML ~~LOC~~ SOLN: 90.0000 [IU] | Freq: Every day | SUBCUTANEOUS | Status: DC

## 2018-07-13 MED ORDER — AMLODIPINE BESYLATE 5 MG PO TABS
2.5000 mg | ORAL_TABLET | Freq: Every day | ORAL | Status: DC
  Administered 2018-07-14: 10:00:00 2.5 mg via ORAL
  Filled 2018-07-13: qty 1

## 2018-07-13 MED ORDER — INSULIN ASPART 100 UNIT/ML ~~LOC~~ SOLN
0.0000 [IU] | Freq: Three times a day (TID) | SUBCUTANEOUS | Status: DC
  Administered 2018-07-13: 2 [IU] via SUBCUTANEOUS
  Administered 2018-07-14: 07:00:00 3 [IU] via SUBCUTANEOUS

## 2018-07-13 MED ORDER — ATORVASTATIN CALCIUM 80 MG PO TABS
80.0000 mg | ORAL_TABLET | Freq: Every day | ORAL | Status: DC
  Administered 2018-07-13: 18:00:00 80 mg via ORAL
  Filled 2018-07-13: qty 1

## 2018-07-13 MED ORDER — MIRTAZAPINE 15 MG PO TABS
15.0000 mg | ORAL_TABLET | Freq: Every day | ORAL | Status: DC
  Administered 2018-07-13: 22:00:00 15 mg via ORAL
  Filled 2018-07-13: qty 1

## 2018-07-13 MED ORDER — SODIUM CHLORIDE 0.9 % IV SOLN: INTRAVENOUS | Status: AC

## 2018-07-13 MED ORDER — PANTOPRAZOLE SODIUM 40 MG PO TBEC
40.0000 mg | DELAYED_RELEASE_TABLET | Freq: Two times a day (BID) | ORAL | Status: DC
  Administered 2018-07-13 – 2018-07-14 (×2): 40 mg via ORAL
  Filled 2018-07-13 (×2): qty 1

## 2018-07-13 MED ORDER — LIDOCAINE HCL (PF) 1 % IJ SOLN
INTRAMUSCULAR | Status: DC | PRN
  Administered 2018-07-13: 30 mL

## 2018-07-13 MED ORDER — ALBUTEROL SULFATE (2.5 MG/3ML) 0.083% IN NEBU: 2.5000 mg | INHALATION_SOLUTION | Freq: Four times a day (QID) | RESPIRATORY_TRACT | Status: DC | PRN

## 2018-07-13 MED ORDER — LOSARTAN POTASSIUM 50 MG PO TABS
25.0000 mg | ORAL_TABLET | Freq: Every day | ORAL | Status: DC
  Administered 2018-07-14: 25 mg via ORAL
  Filled 2018-07-13: qty 1

## 2018-07-13 MED ORDER — CLOPIDOGREL BISULFATE 75 MG PO TABS: 75.0000 mg | ORAL_TABLET | Freq: Every day | ORAL | Status: DC

## 2018-07-13 MED ORDER — LIDOCAINE HCL (PF) 1 % IJ SOLN
INTRAMUSCULAR | Status: AC
  Filled 2018-07-13: qty 30

## 2018-07-13 MED ORDER — BUPROPION HCL ER (SR) 150 MG PO TB12
150.0000 mg | ORAL_TABLET | Freq: Every day | ORAL | Status: DC
  Administered 2018-07-14: 150 mg via ORAL
  Filled 2018-07-13: qty 1

## 2018-07-13 MED ORDER — CLOPIDOGREL BISULFATE 75 MG PO TABS
75.0000 mg | ORAL_TABLET | Freq: Every day | ORAL | Status: DC
  Administered 2018-07-14: 07:00:00 75 mg via ORAL
  Filled 2018-07-13: qty 1

## 2018-07-13 MED ORDER — INSULIN DETEMIR 100 UNIT/ML FLEXPEN: 90.0000 [IU] | PEN_INJECTOR | Freq: Every day | SUBCUTANEOUS | Status: DC

## 2018-07-13 MED ORDER — OLOPATADINE HCL 0.1 % OP SOLN: 1.0000 [drp] | Freq: Two times a day (BID) | OPHTHALMIC | Status: DC

## 2018-07-13 MED ORDER — HEPARIN (PORCINE) IN NACL 1000-0.9 UT/500ML-% IV SOLN
INTRAVENOUS | Status: AC
  Filled 2018-07-13: qty 1000

## 2018-07-13 SURGICAL SUPPLY — 22 items
BALLN SAPPHIRE ~~LOC~~ 4.0X12 (BALLOONS) ×1 IMPLANT
BALLN SAPPHIRE ~~LOC~~ 4.0X18 (BALLOONS) ×1 IMPLANT
BALLN WOLVERINE 3.50X10 (BALLOONS) ×2
BALLOON WOLVERINE 3.50X10 (BALLOONS) IMPLANT
CATH INFINITI 5FR AL1 (CATHETERS) ×1 IMPLANT
CATH INFINITI 5FR JL5 (CATHETERS) ×1 IMPLANT
CATH INFINITI MULTIPACK ST 5F (CATHETERS) ×1 IMPLANT
CATH LAUNCHER 6FR AL1 (CATHETERS) IMPLANT
CATH VISTA GUIDE 6FR AL1 (CATHETERS) ×1 IMPLANT
CATHETER LAUNCHER 6FR AL1 (CATHETERS) ×2
KIT ENCORE 26 ADVANTAGE (KITS) ×1 IMPLANT
KIT HEART LEFT (KITS) ×2 IMPLANT
KIT HEMO VALVE WATCHDOG (MISCELLANEOUS) ×1 IMPLANT
PACK CARDIAC CATHETERIZATION (CUSTOM PROCEDURE TRAY) ×2 IMPLANT
SHEATH PINNACLE 5F 10CM (SHEATH) ×1 IMPLANT
SHEATH PINNACLE 6F 10CM (SHEATH) ×1 IMPLANT
SHEATH PROBE COVER 6X72 (BAG) ×1 IMPLANT
TRANSDUCER W/STOPCOCK (MISCELLANEOUS) ×2 IMPLANT
TUBING CIL FLEX 10 FLL-RA (TUBING) ×2 IMPLANT
WIRE ASAHI PROWATER 180CM (WIRE) ×1 IMPLANT
WIRE EMERALD 3MM-J .035X150CM (WIRE) ×1 IMPLANT
WIRE HI TORQ VERSACORE-J 145CM (WIRE) ×1 IMPLANT

## 2018-07-13 NOTE — Progress Notes (Signed)
Site area: right groin  Site Prior to Removal:  Level 0  Pressure Applied For 20 MINUTES    Minutes Beginning at 1521  Manual:   Yes.    Patient Status During Pull:  wnl  Post Pull Groin Site:  Level 0  Post Pull Instructions Given:  Yes.    Post Pull Pulses Present:  Yes.    Dressing Applied:  Yes.    Comments:  Tolerated procedure well

## 2018-07-13 NOTE — Interval H&P Note (Signed)
Cath Lab Visit (complete for each Cath Lab visit)  Clinical Evaluation Leading to the Procedure:   ACS: No.  Non-ACS:    Anginal Classification: CCS III  Anti-ischemic medical therapy: Minimal Therapy (1 class of medications)  Non-Invasive Test Results: No non-invasive testing performed  Prior CABG: Previous CABG      History and Physical Interval Note:  07/13/2018 11:24 AM  Lance [name]  has presented today for surgery, with the diagnosis of Unstable angina.  The various methods of treatment have been discussed with the patient and family. After consideration of risks, benefits and other options for treatment, the patient has consented to  Procedure(s): LEFT HEART CATH AND CORS/GRAFTS ANGIOGRAPHY (N/A) as a surgical intervention.  The patient's history has been reviewed, patient examined, no change in status, stable for surgery.  I have reviewed the patient's chart and labs.  Questions were answered to the patient's satisfaction.     [name]

## 2018-07-14 DIAGNOSIS — I110 Hypertensive heart disease with heart failure: Secondary | ICD-10-CM | POA: Diagnosis not present

## 2018-07-14 DIAGNOSIS — I25118 Atherosclerotic heart disease of native coronary artery with other forms of angina pectoris: Secondary | ICD-10-CM | POA: Diagnosis not present

## 2018-07-14 DIAGNOSIS — E1140 Type 2 diabetes mellitus with diabetic neuropathy, unspecified: Secondary | ICD-10-CM | POA: Diagnosis not present

## 2018-07-14 DIAGNOSIS — E785 Hyperlipidemia, unspecified: Secondary | ICD-10-CM | POA: Diagnosis not present

## 2018-07-14 DIAGNOSIS — J449 Chronic obstructive pulmonary disease, unspecified: Secondary | ICD-10-CM | POA: Diagnosis not present

## 2018-07-14 DIAGNOSIS — Z955 Presence of coronary angioplasty implant and graft: Secondary | ICD-10-CM | POA: Diagnosis not present

## 2018-07-14 DIAGNOSIS — R0609 Other forms of dyspnea: Secondary | ICD-10-CM | POA: Diagnosis not present

## 2018-07-14 DIAGNOSIS — I5032 Chronic diastolic (congestive) heart failure: Secondary | ICD-10-CM | POA: Diagnosis not present

## 2018-07-14 DIAGNOSIS — Z87891 Personal history of nicotine dependence: Secondary | ICD-10-CM | POA: Diagnosis not present

## 2018-07-14 DIAGNOSIS — H9190 Unspecified hearing loss, unspecified ear: Secondary | ICD-10-CM | POA: Diagnosis not present

## 2018-07-14 DIAGNOSIS — Z951 Presence of aortocoronary bypass graft: Secondary | ICD-10-CM | POA: Diagnosis not present

## 2018-07-14 DIAGNOSIS — I2584 Coronary atherosclerosis due to calcified coronary lesion: Secondary | ICD-10-CM | POA: Diagnosis not present

## 2018-07-14 LAB — CBC
HCT: 40 % (ref 39.0–52.0)
Hemoglobin: 13 g/dL (ref 13.0–17.0)
MCH: 28.5 pg (ref 26.0–34.0)
MCHC: 32.5 g/dL (ref 30.0–36.0)
MCV: 87.7 fL (ref 80.0–100.0)
Platelets: 128 10*3/uL — ABNORMAL LOW (ref 150–400)
RBC: 4.56 MIL/uL (ref 4.22–5.81)
RDW: 14.3 % (ref 11.5–15.5)
WBC: 6 10*3/uL (ref 4.0–10.5)
nRBC: 0 % (ref 0.0–0.2)

## 2018-07-14 LAB — BASIC METABOLIC PANEL
Anion gap: 9 (ref 5–15)
BUN: 14 mg/dL (ref 8–23)
CO2: 25 mmol/L (ref 22–32)
Calcium: 9 mg/dL (ref 8.9–10.3)
Chloride: 105 mmol/L (ref 98–111)
Creatinine, Ser: 1.09 mg/dL (ref 0.61–1.24)
GFR calc Af Amer: >60 mL/min (ref >60)
GFR calc non Af Amer: >60 mL/min (ref >60)
Glucose, Bld: 217 mg/dL — ABNORMAL HIGH (ref 70–99)
Potassium: 3.9 mmol/L (ref 3.5–5.1)
Sodium: 139 mmol/L (ref 135–145)

## 2018-07-14 LAB — GLUCOSE, CAPILLARY: Glucose-Capillary: 187 mg/dL — ABNORMAL HIGH (ref 70–99)

## 2018-07-14 NOTE — Progress Notes (Signed)
Remote ICD transmission.   

## 2018-07-14 NOTE — Discharge Summary (Addendum)
Discharge Summary    Patient ID: Lance [name] MRN: [id number]; DOB: 1945-09-29  Admit date: 07/13/2018 Discharge date: 07/14/2018  Primary Care Provider: [name], MD  Primary Cardiologist: Carlyle Dolly, MD   Discharge Diagnoses    Active Problems:   CAD (coronary artery disease)   HTN   HLD   Chronic diastolic CHF  Allergies Allergies  Allergen Reactions   Morphine And Related Other (See Comments)    hallucinations   Penicillins Hives    Can take cefzil Has patient had a PCN reaction causing immediate rash, facial/tongue/throat swelling, SOB or lightheadedness with hypotension:unsure Has patient had a PCN reaction causing severe rash involving mucus membranes or skin necrosis:unsure Has patient had a PCN reaction that required hospitalization:unsure Has patient had a PCN reaction occurring within the last 10 years:No If all of the above answers are "NO", then may proceed with Cephalosporin use. Childhood reaction.   Percocet [Oxycodone-Acetaminophen] Other (See Comments)    hallucinations   Latex Rash   Levaquin [Levofloxacin In D5w] Itching   Metformin And Related Other (See Comments)    In high doses, causes diarrhea abdominal bloating    Tape Rash    Diagnostic Studies/Procedures    CORONARY BALLOON ANGIOPLASTY 3/13/2  LEFT HEART CATH AND CORS/GRAFTS ANGIOGRAPHY  Conclusion     Ost LAD to Mid LAD lesion is 100% stenosed. LIMA to LAD is patent.  Ost 1st Mrg to 1st Mrg lesion is 100% stenosed. SVG to OM is patent.  RPDA lesion is 90% stenosed. Unchanged from prior.  SVG to diagonal is patent.  SVG to PDA is occluded.  Dist RCA lesion is 60% stenosed. Appears unchanged from prior.  Mid RCA lesion is 75% stenosed, in stent restenosis.  Scoring balloon angioplasty was performed using a BALLOON WOLVERINE 3.50X10.  Post intervention, there is a 10% residual stenosis.  LV end diastolic pressure is normal. LVEDP 14 mm Hg.  There is  no aortic valve stenosis.  Iliac tortuousity present making catheter torquing difficult.   DAPT for at least one month.  Since no new stent was placed, he can hold clopidogrel after 1 month.         History of Present Illness     Lance [name] is a 74 y.o. male with past medical history of CAD (s/p CABG in 2013 with LIMA-LAD, SVG-D1, SVG-OM, and SVG-PDA), DES to SVG-PDA in 2016, DES to mid-RCA in 08/2016), VF arrest (s/p [name] ICD), stable anatomy by repeat cath in 03/2017), chronic diastolic CHF, ectatic aorta (2.5 cm by imaging in 01/2017), HTN, HLD, and prior TIA presents for outpatient cath.   He had a mechanical fall in 01/2018 and injured his rotator cuff.  He was being followed by orthopedics with planned surgical repair but did not yet have a surgery date.  Cath in 03/2017 showed a patent RCA stent but was noted to have high grade stenosis in the origin of the PDA which was poorly suited for PCI.   He had progressive dyspnea on exertion and fatigue for the past 6 weeks and is now experiencing dyspnea when walking from room to room in his house. Labs on 2/26 showed no acute findings. Recent EKG showed no acute ST changes. Recommended cath.   Hospital Course     Consultants: None  Cath as above. Her underwent scoring balloon angioplasty using a BALLOON WOLVERINE 3.50X10 for in stent restenosis of mRCA. No complications. Renal function stable. Ambulated well.   The patient been  seen by Dr. Gwenlyn Found today and deemed ready for discharge home. All follow-up appointments have been scheduled. Discharge medications are listed below.   Discharge Vitals Blood pressure 123/79, pulse 61, temperature 98.1 F (36.7 C), temperature source Oral, resp. rate 11, height 5\' 9"  (1.753 m), weight 95.3 kg, SpO2 93 %.  Filed Weights   07/13/18 0907 07/14/18 0354  Weight: 100.7 kg 95.3 kg   Physical Exam  Constitutional: He is oriented to person, place, and time and well-developed, well-nourished,  and in no distress.  HENT:  Head: Normocephalic and atraumatic.  Eyes: Pupils are equal, round, and reactive to light. EOM are normal.  Neck: Normal range of motion. Neck supple.  Cardiovascular: Normal rate and regular rhythm.  R groin cath with mild bruise, no hematoma  Pulmonary/Chest: Effort normal and breath sounds normal.  Abdominal: Soft. Bowel sounds are normal.  Neurological: He is alert and oriented to person, place, and time.  Skin: Skin is warm and dry.  Psychiatric: Affect normal.   Labs & Radiologic Studies    CBC Recent Labs    07/14/18 0438  WBC 6.0  HGB 13.0  HCT 40.0  MCV 87.7  PLT 729*   Basic Metabolic Panel Recent Labs    07/14/18 0438  NA 139  K 3.9  CL 105  CO2 25  GLUCOSE 217*  BUN 14  CREATININE 1.09  CALCIUM 9.0  _____________  Dg Chest 2 View  Result Date: 06/28/2018 CLINICAL DATA:  Dizziness and weakness for several weeks EXAM: CHEST - 2 VIEW COMPARISON:  09/08/2016 FINDINGS: Cardiac shadow is stable. Defibrillator is again noted and stable. Postsurgical changes are again seen. The lungs are well aerated bilaterally. No focal infiltrate or sizable effusion is noted. Degenerative changes of the thoracic spine are seen. IMPRESSION: No acute abnormality noted. Electronically Signed   By: Inez Catalina M.D.   On: 06/28/2018 12:25   Ct Head Wo Contrast  Result Date: 06/28/2018 CLINICAL DATA:  Dizziness EXAM: CT HEAD WITHOUT CONTRAST TECHNIQUE: Contiguous axial images were obtained from the base of the skull through the vertex without intravenous contrast. COMPARISON:  07/07/2015 FINDINGS: Brain: Mild atrophic changes are noted. No findings to suggest acute hemorrhage, acute infarction or space-occupying mass lesion are noted. Vascular: No hyperdense vessel or unexpected calcification. Skull: Normal. Negative for fracture or focal lesion. Sinuses/Orbits: No acute finding. Other: Chronic right mastoid effusion is noted. IMPRESSION: Chronic atrophic  changes without acute abnormality. Electronically Signed   By: Inez Catalina M.D.   On: 06/28/2018 12:31   Ct Shoulder Left W Contrast  Result Date: 06/18/2018 CLINICAL DATA:  Shoulder pain with limited range of motion since falling 5 months ago. History of hypertension, diabetes and pacemaker. EXAM: CT ARTHROGRAPHY OF THE LEFT SHOULDER TECHNIQUE: Multidetector CT imaging was performed following the standard protocol after injection of dilute contrast into the joint. COMPARISON:  Left shoulder radiographs 02/11/2018. Injection image today. FINDINGS: Bones/Joint/Cartilage The shoulder joint is well distended with contrast. There is diffuse synovial irregularity. Contrast extends into the subacromial-subdeltoid bursa. There are underlying glenohumeral degenerative changes with chondral thinning and osteophyte formation. There is a nondisplaced tear of the posterior labrum. There are mild acromioclavicular degenerative changes. The acromion is type 2. No acute osseous findings are evident. Patient is status post C6-7 ACDF. There is mild left-sided foraminal narrowing at the operative level due to uncinate spurring. Ligaments Grossly intact glenohumeral ligaments. Muscles and Tendons Contrast placed in the shoulder joint extends into the subacromial-subdeltoid bursa through a  large full-thickness rotator cuff tear. The supraspinatus and infraspinatus tendons are completely torn and moderately retracted. There is associated moderate muscular atrophy. The subscapularis and teres minor tendons appear intact. The biceps tendon is intact. Soft Tissues No focal periarticular fluid collection or inflammation identified. Pacemaker generator noted in the left anterior chest. Coronary artery atherosclerosis. IMPRESSION: 1. Large full-thickness rotator cuff tear with retracted tears of the supraspinatus and infraspinatus tendons and associated muscular atrophy. 2. Glenohumeral degenerative changes with nondisplaced tear of the  posterior labrum. 3. Intact biceps tendon. Electronically Signed   By: Richardean Sale M.D.   On: 06/18/2018 10:10   Dg Fluoro Guided Needle Plc Aspiration/injection Loc  Result Date: 06/16/2018 CLINICAL DATA:  LEFT shoulder pain. FLUOROSCOPY TIME:  11 seconds corresponding to a Dose Area Product of 18.06 Gy*m2 PROCEDURE: LEFT SHOULDER INJECTION UNDER FLUOROSCOPY Informed written consent was obtained.  Time-out was performed. An appropriate skin entrance site was determined. The site was marked, prepped with Betadine, draped in the usual sterile fashion, and infiltrated locally with 1% lidocaine. 22 gauge spinal needle was advanced to the superomedial margin of the humeral head under intermittent fluoroscopy. 1 ml of Lidocaine injected easily. A mixture of 15 mL Isovue M 200 with 5 mL sterile saline was then used to opacify the LEFT shoulder capsule. Total volume injected was 13 mL. No immediate complication. IMPRESSION: Technically successful LEFT shoulder injection for MRI. Electronically Signed   By: Staci Righter M.D.   On: 06/16/2018 15:24   Disposition   Pt is being discharged home today in good condition.  Follow-up Plans & Appointments    Follow-up Information    Arnoldo Lenis, MD. Go on 08/18/2018.   Specialty:  Cardiology Why:  @1 :30pm for cath follow up Contact information: [address] [place] [address] [phone]          Discharge Instructions    Amb Referral to Cardiac Rehabilitation   Complete by:  As directed    Diagnosis:  PTCA   Diet - low sodium heart healthy   Complete by:  As directed    Discharge instructions   Complete by:  As directed    No driving for 48 hours. No lifting over 5 lbs for 1 week. No sexual activity for 1 week. Keep procedure site clean & dry. If you notice increased pain, swelling, bleeding or pus, call/return!  You may shower, but no soaking baths/hot tubs/pools for 1 week.   Increase activity slowly   Complete by:  As  directed       Discharge Medications   Allergies as of 07/14/2018      Reactions   Morphine And Related Other (See Comments)   hallucinations   Penicillins Hives   Can take cefzil Has patient had a PCN reaction causing immediate rash, facial/tongue/throat swelling, SOB or lightheadedness with hypotension:unsure Has patient had a PCN reaction causing severe rash involving mucus membranes or skin necrosis:unsure Has patient had a PCN reaction that required hospitalization:unsure Has patient had a PCN reaction occurring within the last 10 years:No If all of the above answers are "NO", then may proceed with Cephalosporin use. Childhood reaction.   Percocet [oxycodone-acetaminophen] Other (See Comments)   hallucinations   Latex Rash   Levaquin [levofloxacin In D5w] Itching   Metformin And Related Other (See Comments)   In high doses, causes diarrhea abdominal bloating    Tape Rash      Medication List    TAKE these medications  albuterol 108 (90 Base) MCG/ACT inhaler Commonly known as:  PROVENTIL HFA;VENTOLIN HFA Inhale 2 puffs into the lungs every 6 (six) hours as needed for wheezing.   albuterol (2.5 MG/3ML) 0.083% nebulizer solution Commonly known as:  PROVENTIL Take 3 mLs (2.5 mg total) by nebulization every 6 (six) hours as needed for wheezing or shortness of breath.   ALIGN PO Take 1 tablet by mouth daily.   ALPRAZolam 0.5 MG tablet Commonly known as:  XANAX Take 1 tablet (0.5 mg total) by mouth 2 (two) times daily as needed for anxiety. What changed:  when to take this   amLODipine 2.5 MG tablet Commonly known as:  Norvasc Take 1 tablet (2.5 mg total) by mouth daily.   aspirin EC 81 MG tablet Take 81 mg by mouth daily.   atorvastatin 80 MG tablet Commonly known as:  LIPITOR TAKE ONE (1) TABLET EACH DAY   budesonide-formoterol 160-4.5 MCG/ACT inhaler Commonly known as:  SYMBICORT Inhale 2 puffs into the lungs 2 (two) times daily.   buPROPion 150 MG 12 hr  tablet Commonly known as:  Wellbutrin SR Take 1 tablet (150 mg total) by mouth daily.   clopidogrel 75 MG tablet Commonly known as:  PLAVIX TAKE ONE (1) TABLET BY MOUTH EVERY DAY   donepezil 10 MG tablet Commonly known as:  ARICEPT TAKE ONE TABLET BY MOUTH AT BEDTIME.   fexofenadine 180 MG tablet Commonly known as:  ALLEGRA Take 180 mg by mouth daily as needed for allergies.   guaiFENesin 600 MG 12 hr tablet Commonly known as:  MUCINEX Take 1,200 mg by mouth 2 (two) times daily.   HumaLOG KwikPen 100 UNIT/ML KwikPen Generic drug:  insulin lispro Inject 0.32-0.38 mLs (32-38 Units total) into the skin 3 (three) times daily. What changed:    how much to take  when to take this  additional instructions   HYDROcodone-acetaminophen 7.5-500 MG tablet Commonly known as:  LORTAB Take 1 tablet by mouth every 6 (six) hours as needed for pain.   Insulin Detemir 100 UNIT/ML Pen Commonly known as:  Levemir FlexTouch Inject 90 Units into the skin daily at 10 pm. What changed:  when to take this   Jardiance 25 MG Tabs tablet Generic drug:  empagliflozin Take 1 tablet daily by mouth.   losartan 50 MG tablet Commonly known as:  COZAAR Take one half tablet by mouth daily.   metoprolol tartrate 25 MG tablet Commonly known as:  LOPRESSOR TAKE 1 TABLET (25 MG TOTAL) BY MOUTH 2 TIMES DAILY.   mirtazapine 15 MG tablet Commonly known as:  REMERON Take 1 tablet (15 mg total) by mouth at bedtime.   nitroGLYCERIN 0.4 MG SL tablet Commonly known as:  NITROSTAT Place 1 tablet (0.4 mg total) under the tongue every 5 (five) minutes as needed. For chest pain   olopatadine 0.1 % ophthalmic solution Commonly known as:  PATANOL Place 1 drop into both eyes 2 (two) times daily.   ondansetron 8 MG tablet Commonly known as:  Zofran One po Tid prn nausea   pantoprazole 40 MG tablet Commonly known as:  PROTONIX TAKE ONE TABLET BY MOUTH TWICE A DAY   ROLAIDS PO Take 2 tablets as needed  by mouth.   tamsulosin 0.4 MG Caps capsule Commonly known as:  FLOMAX TAKE 1 CAPSULE BY MOUTH BY MOUTH AT BEDTIME   vitamin B-12 1000 MCG tablet Commonly known as:  CYANOCOBALAMIN Take 1,000 mcg by mouth daily. Reported on 08/25/2015  Acute coronary syndrome (MI, NSTEMI, STEMI, etc) this admission?: No.    Outstanding Labs/Studies   None  Duration of Discharge Encounter   Greater than 30 minutes including physician time.  SignedLeanor Kail, PA 07/14/2018, 8:48 AM  Agree with note by Robbie Lis PA-C  Postop day 1 Cutting Balloon atherectomy of the native RCA by Dr. Irish Lack yesterday.  Groin looks good.  This was probably his "culprit lesion" responsible for his dyspnea, similar to his previous bypass symptoms.  He will need dual antiplatelet therapy for 1 month after which she can safely undergo his rotator cuff surgery.  His exam is benign and labs are stable.  He will follow-up with Dr. Harl Bowie as an outpatient.  Lorretta Harp, M.D., [place], [facility], [place] [address] [place], [place]  [address]  [phone] 07/14/2018 8:56 AM

## 2018-07-14 NOTE — Progress Notes (Signed)
CARDIAC REHAB PHASE I   PRE:  Rate/Rhythm: 67 SR  BP:  Supine: 123/79  Sitting:   Standing:    SaO2: 96%RA  MODE:  Ambulation: 800 ft   POST:  Rate/Rhythm: 75 SR  BP:  Supine: 144/82  Sitting:   Standing:    SaO2: 98%RA 0805-0850 Pt walked 800 ft with steady gait and no CP. Tolerated well. Reviewed NTG use, encouraged not to chew and gave tobacco cessation handout, gave heart healthy and diabetic diets and ex ed. Pt voiced understanding. Stated he has attended CRP 2 before. Referred to [facility]    Graylon Good, RN BSN  07/14/2018 8:45 AM

## 2018-07-17 ENCOUNTER — Encounter: Payer: Self-pay | Admitting: Cardiology

## 2018-07-17 DIAGNOSIS — M542 Cervicalgia: Secondary | ICD-10-CM | POA: Diagnosis not present

## 2018-07-17 DIAGNOSIS — M791 Myalgia, unspecified site: Secondary | ICD-10-CM | POA: Diagnosis not present

## 2018-07-17 DIAGNOSIS — G894 Chronic pain syndrome: Secondary | ICD-10-CM | POA: Diagnosis not present

## 2018-07-17 LAB — POCT ACTIVATED CLOTTING TIME: Activated Clotting Time: 164 seconds

## 2018-07-19 ENCOUNTER — Other Ambulatory Visit: Payer: Self-pay | Admitting: *Deleted

## 2018-07-19 ENCOUNTER — Telehealth: Payer: Self-pay | Admitting: Family Medicine

## 2018-07-19 MED ORDER — DOXYCYCLINE HYCLATE 100 MG PO TABS: 100.0000 mg | ORAL_TABLET | Freq: Two times a day (BID) | ORAL | 0 refills | Status: DC

## 2018-07-19 NOTE — Telephone Encounter (Signed)
Med sent to pharm. Pt notified.  

## 2018-07-19 NOTE — Telephone Encounter (Signed)
Pt calling in to see if Dr. Nicki Reaper will call something into [place]. Pt's head is stopped up, he is coughing and wheezing. No fever and is not SOB.   He has not taken anything for this and it has been going on for 3 days

## 2018-07-19 NOTE — Telephone Encounter (Signed)
Doxycycline 100 mg 1 twice daily for 7 days

## 2018-07-19 NOTE — Telephone Encounter (Signed)
Please advise. Thank you

## 2018-07-20 ENCOUNTER — Other Ambulatory Visit: Payer: Self-pay | Admitting: Gastroenterology

## 2018-08-09 ENCOUNTER — Telehealth: Payer: Self-pay

## 2018-08-09 NOTE — Telephone Encounter (Signed)
Virtual Visit Pre-Appointment Phone Call  Steps For Call:  1. Confirm consent - "In the setting of the current Covid19 crisis, you are scheduled for a (phone or video) visit with your provider on (date) at (time).  Just as we do with many in-office visits, in order for you to participate in this visit, we must obtain consent.  If you'd like, I can send this to your mychart (if signed up) or email for you to review.  Otherwise, I can obtain your verbal consent now.  All virtual visits are billed to your insurance company just like a normal visit would be.  By agreeing to a virtual visit, we'd like you to understand that the technology does not allow for your provider to perform an examination, and thus may limit your provider's ability to fully assess your condition.  Finally, though the technology is pretty good, we cannot assure that it will always work on either your or our end, and in the setting of a video visit, we may have to convert it to a phone-only visit.  In either situation, we cannot ensure that we have a secure connection.  Are you willing to proceed?"  2. Give patient instructions for WebEx download to smartphone as below if video visit  3. Advise patient to be prepared with any vital sign or heart rhythm information, their current medicines, and a piece of paper and pen handy for any instructions they may receive the day of their visit  4. Inform patient they will receive a phone call 15 minutes prior to their appointment time (may be from unknown caller ID) so they should be prepared to answer  5. Confirm that appointment type is correct in Epic appointment notes (video vs telephone)    TELEPHONE CALL NOTE  Lance [name] has been deemed a candidate for a follow-up tele-health visit to limit community exposure during the Covid-19 pandemic. I spoke with the patient via phone to ensure availability of phone/video source, confirm preferred email & phone number, and discuss  instructions and expectations.  I reminded Lance [name] to be prepared with any vital sign and/or heart rhythm information that could potentially be obtained via home monitoring, at the time of his visit. I reminded Lance [name] to expect a phone call at the time of his visit if his visit.  Did the patient verbally acknowledge consent to treatment? YES   Drema Dallas, CMA 08/09/2018 4:29 PM   DOWNLOADING THE [place], go to [employer] and type in WebEx in the search bar. [place] [employer], the blue/green circle. The app is free but as with any other app downloads, their phone may require them to verify saved payment information or Apple password. The patient does NOT have to create an account.  - If Android, ask patient to go to [employer] and type in WebEx in the search bar. [place] [employer], the blue/green circle. The app is free but as with any other app downloads, their phone may require them to verify saved payment information or Android password. The patient does NOT have to create an account.   CONSENT FOR TELE-HEALTH VISIT - PLEASE REVIEW  I hereby voluntarily request, consent and authorize [facility] and its employed or contracted physicians, physician assistants, nurse practitioners or other licensed health care professionals (the Practitioner), to provide me with telemedicine health care services (the "Services") as deemed necessary by the treating  Practitioner. I acknowledge and consent to receive the Services by the Practitioner via telemedicine. I understand that the telemedicine visit will involve communicating with the Practitioner through live audiovisual communication technology and the disclosure of certain medical information by electronic transmission. I acknowledge that I have been given the opportunity to request an in-person assessment or other available alternative prior to the telemedicine visit and am  voluntarily participating in the telemedicine visit.  I understand that I have the right to withhold or withdraw my consent to the use of telemedicine in the course of my care at any time, without affecting my right to future care or treatment, and that the Practitioner or I may terminate the telemedicine visit at any time. I understand that I have the right to inspect all information obtained and/or recorded in the course of the telemedicine visit and may receive copies of available information for a reasonable fee.  I understand that some of the potential risks of receiving the Services via telemedicine include:  [place] Kitchen Delay or interruption in medical evaluation due to technological equipment failure or disruption; . Information transmitted may not be sufficient (e.g. poor resolution of images) to allow for appropriate medical decision making by the Practitioner; and/or  . In rare instances, security protocols could fail, causing a breach of personal health information.  Furthermore, I acknowledge that it is my responsibility to provide information about my medical history, conditions and care that is complete and accurate to the best of my ability. I acknowledge that Practitioner's advice, recommendations, and/or decision may be based on factors not within their control, such as incomplete or inaccurate data provided by me or distortions of diagnostic images or specimens that may result from electronic transmissions. I understand that the practice of medicine is not an exact science and that Practitioner makes no warranties or guarantees regarding treatment outcomes. I acknowledge that I will receive a copy of this consent concurrently upon execution via email to the email address I last provided but may also request a printed copy by calling the office of [place].    I understand that my insurance will be billed for this visit.   I have read or had this consent read to me. . I understand the  contents of this consent, which adequately explains the benefits and risks of the Services being provided via telemedicine.  . I have been provided ample opportunity to ask questions regarding this consent and the Services and have had my questions answered to my satisfaction. . I give my informed consent for the services to be provided through the use of telemedicine in my medical care  By participating in this telemedicine visit I agree to the above.

## 2018-08-10 ENCOUNTER — Ambulatory Visit (INDEPENDENT_AMBULATORY_CARE_PROVIDER_SITE_OTHER): Payer: Medicare Other | Admitting: Family Medicine

## 2018-08-10 ENCOUNTER — Other Ambulatory Visit: Payer: Self-pay

## 2018-08-10 DIAGNOSIS — J301 Allergic rhinitis due to pollen: Secondary | ICD-10-CM | POA: Diagnosis not present

## 2018-08-10 DIAGNOSIS — J019 Acute sinusitis, unspecified: Secondary | ICD-10-CM | POA: Diagnosis not present

## 2018-08-10 MED ORDER — CEFDINIR 300 MG PO CAPS: 300.0000 mg | ORAL_CAPSULE | Freq: Two times a day (BID) | ORAL | 0 refills | Status: DC

## 2018-08-10 NOTE — Progress Notes (Signed)
   Subjective:    Patient ID: Lance [name], male    DOB: 28-Feb-1946, 73 y.o.   MRN: [id number] Patient unable to do virtual visit other than telephone Telephone call only no visual It was necessary because of coronavirus Patient was at home I was at the office Sinus Problem  This is a new problem. The current episode started in the past 7 days. Associated symptoms include congestion, coughing, headaches and sinus pressure. (Tight in chest) Past treatments include nothing.  Significant head congestion drainage coughing denies any chest pressure but at times the drainage makes him feel little tight in the chest denies sweats chills or other issues  Virtual Visit via Video Note  I connected with Lance [name] on 08/10/18 at 10:30 AM EDT by a video enabled telemedicine application and verified that I am speaking with the correct person using two identifiers.   I discussed the limitations of evaluation and management by telemedicine and the availability of in person appointments. The patient expressed understanding and agreed to proceed.  History of Present Illness:    Observations/Objective:   Assessment and Plan:   Follow Up Instructions:    I discussed the assessment and treatment plan with the patient. The patient was provided an opportunity to ask questions and all were answered. The patient agreed with the plan and demonstrated an understanding of the instructions.   The patient was advised to call back or seek an in-person evaluation if the symptoms worsen or if the condition fails to improve as anticipated.  I provided 12 minutes of non-face-to-face time during this encounter.      Review of Systems  HENT: Positive for congestion and sinus pressure.   Respiratory: Positive for cough.   Neurological: Positive for headaches.       Objective:   Physical Exam   Unable to do physical exam     Assessment & Plan:  Probable sinusitis along with allergic rhinitis  recommend allergy medicine antibiotics and follow-up if progressive troubles warning signs were discussed in detail I do not feel the patient has coronavirus.

## 2018-08-18 ENCOUNTER — Encounter: Payer: Self-pay | Admitting: Cardiology

## 2018-08-18 ENCOUNTER — Telehealth (INDEPENDENT_AMBULATORY_CARE_PROVIDER_SITE_OTHER): Payer: Medicare Other | Admitting: Cardiology

## 2018-08-18 VITALS — Ht 69.0 in | Wt 222.0 lb

## 2018-08-18 DIAGNOSIS — I25110 Atherosclerotic heart disease of native coronary artery with unstable angina pectoris: Secondary | ICD-10-CM | POA: Diagnosis not present

## 2018-08-18 DIAGNOSIS — R0600 Dyspnea, unspecified: Secondary | ICD-10-CM

## 2018-08-18 DIAGNOSIS — R0609 Other forms of dyspnea: Secondary | ICD-10-CM | POA: Diagnosis not present

## 2018-08-18 NOTE — Patient Instructions (Addendum)
Your physician wants you to follow-up in: 4 MONTHS WITH DR BRANCH  Your physician recommends that you continue on your current medications as directed. Please refer to the Current Medication list given to you today.  Thank you for choosing [facility]!!   
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
## 2018-08-21 DIAGNOSIS — Z5181 Encounter for therapeutic drug level monitoring: Secondary | ICD-10-CM | POA: Diagnosis not present

## 2018-08-21 DIAGNOSIS — Z794 Long term (current) use of insulin: Secondary | ICD-10-CM | POA: Diagnosis not present

## 2018-08-21 DIAGNOSIS — E1142 Type 2 diabetes mellitus with diabetic polyneuropathy: Secondary | ICD-10-CM | POA: Diagnosis not present

## 2018-08-21 DIAGNOSIS — I2510 Atherosclerotic heart disease of native coronary artery without angina pectoris: Secondary | ICD-10-CM | POA: Diagnosis not present

## 2018-08-22 DIAGNOSIS — H43822 Vitreomacular adhesion, left eye: Secondary | ICD-10-CM | POA: Diagnosis not present

## 2018-08-22 DIAGNOSIS — H35373 Puckering of macula, bilateral: Secondary | ICD-10-CM | POA: Diagnosis not present

## 2018-08-22 DIAGNOSIS — E113513 Type 2 diabetes mellitus with proliferative diabetic retinopathy with macular edema, bilateral: Secondary | ICD-10-CM | POA: Diagnosis not present

## 2018-08-22 DIAGNOSIS — H4313 Vitreous hemorrhage, bilateral: Secondary | ICD-10-CM | POA: Diagnosis not present

## 2018-08-22 DIAGNOSIS — E1142 Type 2 diabetes mellitus with diabetic polyneuropathy: Secondary | ICD-10-CM | POA: Diagnosis not present

## 2018-08-22 DIAGNOSIS — E1165 Type 2 diabetes mellitus with hyperglycemia: Secondary | ICD-10-CM | POA: Diagnosis not present

## 2018-08-22 DIAGNOSIS — Z79899 Other long term (current) drug therapy: Secondary | ICD-10-CM | POA: Diagnosis not present

## 2018-08-22 DIAGNOSIS — I2510 Atherosclerotic heart disease of native coronary artery without angina pectoris: Secondary | ICD-10-CM | POA: Diagnosis not present

## 2018-08-30 ENCOUNTER — Encounter: Payer: Self-pay | Admitting: Family Medicine

## 2018-08-30 ENCOUNTER — Ambulatory Visit (INDEPENDENT_AMBULATORY_CARE_PROVIDER_SITE_OTHER): Payer: Medicare Other | Admitting: Family Medicine

## 2018-08-30 ENCOUNTER — Other Ambulatory Visit: Payer: Self-pay

## 2018-08-30 DIAGNOSIS — R0609 Other forms of dyspnea: Secondary | ICD-10-CM

## 2018-08-30 DIAGNOSIS — R0600 Dyspnea, unspecified: Secondary | ICD-10-CM

## 2018-08-30 NOTE — Progress Notes (Signed)
   Subjective:    Patient ID: Lance [name], male    DOB: 07-Dec-1945, 73 y.o.   MRN: [id number] Format-telephone  Patient present at home Provider present at office Consent for interaction obtained Coronavirus outbreak made virtual visit necessary   Hypertension  This is a chronic problem. The current episode started more than 1 year ago. Risk factors for coronary artery disease include dyslipidemia and male gender. Treatments tried: norvasc, lopressor, cozaar.  Video not available for the patient He is going through a lot of mental stress he is recently separated from his long-term wife because of differences related to he states it is difficult for him at home because of so many grown children are in the home Patient wants to see a lung doctor He relates that he intermittently gets short of breath doing things.  He denies any chest pressure tightness with it.  He does have a history of cardiomyopathy and heart disease he also has a history of lung related issues but has not seen a pulmonologist recently  Virtual Visit via Video Note  I connected with Lance [name] on 08/30/18 at  9:00 AM EDT by a video enabled telemedicine application and verified that I am speaking with the correct person using two identifiers.   I discussed the limitations of evaluation and management by telemedicine and the availability of in person appointments. The patient expressed understanding and agreed to proceed.  History of Present Illness:    Observations/Objective:   Assessment and Plan:   Follow Up Instructions:    I discussed the assessment and treatment plan with the patient. The patient was provided an opportunity to ask questions and all were answered. The patient agreed with the plan and demonstrated an understanding of the instructions.   The patient was advised to call back or seek an in-person evaluation if the symptoms worsen or if the condition fails to improve as anticipated.  I  provided 15 minutes of non-face-to-face time during this encounter.      Review of Systems     Objective:   Physical Exam        Assessment & Plan:  Patient states stress he will work through does not want to have any additional medicine.  In addition to this DOE- I think it is reasonable to get pulmonary consultation he will need pulmonary function testing.  Would not be surprising to have an element of COPD.  Patient states he does not have any other concerns currently

## 2018-09-01 DIAGNOSIS — E113512 Type 2 diabetes mellitus with proliferative diabetic retinopathy with macular edema, left eye: Secondary | ICD-10-CM | POA: Diagnosis not present

## 2018-09-05 DIAGNOSIS — E113511 Type 2 diabetes mellitus with proliferative diabetic retinopathy with macular edema, right eye: Secondary | ICD-10-CM | POA: Diagnosis not present

## 2018-09-06 DIAGNOSIS — M542 Cervicalgia: Secondary | ICD-10-CM | POA: Diagnosis not present

## 2018-09-06 DIAGNOSIS — M7918 Myalgia, other site: Secondary | ICD-10-CM | POA: Diagnosis not present

## 2018-09-06 DIAGNOSIS — G894 Chronic pain syndrome: Secondary | ICD-10-CM | POA: Diagnosis not present

## 2018-09-08 ENCOUNTER — Other Ambulatory Visit: Payer: Self-pay | Admitting: Family Medicine

## 2018-09-14 ENCOUNTER — Ambulatory Visit: Payer: Medicare Other | Admitting: Adult Health

## 2018-09-18 ENCOUNTER — Other Ambulatory Visit: Payer: Self-pay | Admitting: Family Medicine

## 2018-09-19 ENCOUNTER — Telehealth: Payer: Self-pay | Admitting: Cardiology

## 2018-09-19 ENCOUNTER — Other Ambulatory Visit: Payer: Self-pay | Admitting: *Deleted

## 2018-09-19 DIAGNOSIS — E1142 Type 2 diabetes mellitus with diabetic polyneuropathy: Secondary | ICD-10-CM | POA: Diagnosis not present

## 2018-09-19 DIAGNOSIS — B351 Tinea unguium: Secondary | ICD-10-CM | POA: Diagnosis not present

## 2018-09-19 MED ORDER — AMLODIPINE BESYLATE 2.5 MG PO TABS: 2.5000 mg | ORAL_TABLET | Freq: Every day | ORAL | 6 refills | Status: DC

## 2018-09-19 MED ORDER — AMLODIPINE BESYLATE 2.5 MG PO TABS: 2.5000 mg | ORAL_TABLET | Freq: Every day | ORAL | 1 refills | Status: DC

## 2018-09-19 NOTE — Telephone Encounter (Signed)
PT'S secretary wanted to be sure he was taking amlodipine 2.5 mg and not the 5 mg that was in bottle. I went over medication list with her and she has it correct.

## 2018-09-19 NOTE — Telephone Encounter (Signed)
Please give Lance [name] a call-- pt's secretary concerning the pt's medication, she's trying to fix his medicines.

## 2018-09-19 NOTE — Telephone Encounter (Signed)
[facility] requested #[phone] supply for amlodipine for insurance to pay - Medication sent to pharmacy.

## 2018-09-19 NOTE — Telephone Encounter (Signed)
May have this +3 refills 

## 2018-09-27 ENCOUNTER — Telehealth: Payer: Self-pay

## 2018-09-27 NOTE — Telephone Encounter (Signed)
Spoke with the patient's wife and she has agreed to move his appt to [facility] on 12/04/2018 at 10:15 am. She is aware of appt and time.

## 2018-10-03 ENCOUNTER — Ambulatory Visit (INDEPENDENT_AMBULATORY_CARE_PROVIDER_SITE_OTHER): Payer: Medicare Other | Admitting: Internal Medicine

## 2018-10-03 ENCOUNTER — Encounter: Payer: Self-pay | Admitting: Internal Medicine

## 2018-10-03 ENCOUNTER — Other Ambulatory Visit: Payer: Self-pay

## 2018-10-03 ENCOUNTER — Ambulatory Visit: Payer: Medicare Other

## 2018-10-03 VITALS — BP 136/78 | HR 68 | Temp 98.0°F | Ht 69.0 in | Wt 220.0 lb

## 2018-10-03 DIAGNOSIS — R05 Cough: Secondary | ICD-10-CM | POA: Diagnosis not present

## 2018-10-03 DIAGNOSIS — I255 Ischemic cardiomyopathy: Secondary | ICD-10-CM

## 2018-10-03 DIAGNOSIS — R0609 Other forms of dyspnea: Secondary | ICD-10-CM | POA: Diagnosis not present

## 2018-10-03 DIAGNOSIS — R058 Other specified cough: Secondary | ICD-10-CM

## 2018-10-03 DIAGNOSIS — R0600 Dyspnea, unspecified: Secondary | ICD-10-CM

## 2018-10-03 LAB — CBC WITH DIFFERENTIAL/PLATELET
Basophils Absolute: 0 10*3/uL (ref 0.0–0.1)
Basophils Relative: 0.6 % (ref 0.0–3.0)
Eosinophils Absolute: 0.1 10*3/uL (ref 0.0–0.7)
Eosinophils Relative: 1.9 % (ref 0.0–5.0)
HCT: 41.3 % (ref 39.0–52.0)
Hemoglobin: 14.1 g/dL (ref 13.0–17.0)
Lymphocytes Relative: 35.5 % (ref 12.0–46.0)
Lymphs Abs: 1.8 10*3/uL (ref 0.7–4.0)
MCHC: 34.2 g/dL (ref 30.0–36.0)
MCV: 88.2 fl (ref 78.0–100.0)
Monocytes Absolute: 0.5 10*3/uL (ref 0.1–1.0)
Monocytes Relative: 9.3 % (ref 3.0–12.0)
Neutro Abs: 2.6 10*3/uL (ref 1.4–7.7)
Neutrophils Relative %: 52.7 % (ref 43.0–77.0)
Platelets: 118 10*3/uL — ABNORMAL LOW (ref 150.0–400.0)
RBC: 4.69 Mil/uL (ref 4.22–5.81)
RDW: 15.3 % (ref 11.5–15.5)
WBC: 5 10*3/uL (ref 4.0–10.5)

## 2018-10-03 LAB — BASIC METABOLIC PANEL
BUN: 21 mg/dL (ref 6–23)
CO2: 25 mEq/L (ref 19–32)
Calcium: 8.8 mg/dL (ref 8.4–10.5)
Chloride: 103 mEq/L (ref 96–112)
Creatinine, Ser: 1.1 mg/dL (ref 0.40–1.50)
GFR: 66.05 mL/min (ref >60.00)
Glucose, Bld: 279 mg/dL — ABNORMAL HIGH (ref 70–99)
Potassium: 3.9 mEq/L (ref 3.5–5.1)
Sodium: 137 mEq/L (ref 135–145)

## 2018-10-03 LAB — TSH: TSH: 2.29 u[IU]/mL (ref 0.35–4.50)

## 2018-10-03 LAB — BRAIN NATRIURETIC PEPTIDE: Pro B Natriuretic peptide (BNP): 38 pg/mL (ref 0.0–100.0)

## 2018-10-03 NOTE — Progress Notes (Signed)
Lance [name], male    DOB: 06/19/1945, 73 y.o.   MRN: [id number]   Brief patient profile:  72 yowm quit smoking 2000 due to heart with nl pfts as of 03/21/14 referred to pulmonary clinic 10/03/2018 by Dr   Lance [name] for cough x around 2013 (? p 2 neck surgeries)  never resolved (w/u by Lance [name] 1948 neg )  then doe 2015 and already had cards eval with last [place] 07/2018:  ostial LAD occluded, LCX occluded, RCA mid 75%. RPDA 90% chronic. LIMA-LAD patent, SVG-OM patent, SVG-diag patent,  SVG-RPDA occluded. The RCA was ballooned. LVEDP 14 so referred to pulmonary.  Note neg w/u for lung dz by Lance [name] in 2016 and no response to spiriva so d/c'd/   History of Present Illness  10/03/2018  Pulmonary/ 1st office eval/Lance [name]  Chief Complaint  Patient presents with  . Shortness of Breath    Also feels like he is choking when eating. Will get tired very easily, has been seen by cardiology.   Dyspnea:  100 ft / slow = MMRC3 = can't walk 100 yards even at a slow pace at a flat grade s stopping due to sob  s variability Cough: dry hack 24/7 / globus sensations with dysphagia  / does not exac in am  Sleep: flat bed/ one pillow / some choking / smothering at hs / SABA use: no better and worse cough with  symbicort  so not using,  even neb does not help Uses allegra daily, not clearly helping "keep a mint in my mouth all the time" and seems to help throat "feel better" but not the cough    No obvious day to day or daytime variability or assoc excess/ purulent sputum or mucus plugs or hemoptysis or cp or chest tightness, subjective wheeze or overt sinus or hb symptoms.    Also denies any obvious fluctuation of symptoms with weather or environmental changes or other aggravating or alleviating factors except as outlined above   No unusual exposure hx or h/o childhood pna/ asthma or knowledge of premature birth.  Current Allergies, Complete Past Medical History, Past Surgical History, Family History, and Social  History were reviewed in [employer] record.  ROS  The following are not active complaints unless bolded Hoarseness, sore throat, dysphagia, dental problems, itching, sneezing,  nasal congestion or discharge of excess mucus or purulent secretions, ear ache,   fever, chills, sweats, unintended wt loss or wt gain, classically pleuritic or exertional cp,  orthopnea pnd or arm/hand swelling  or leg swelling, presyncope, palpitations, abdominal pain, anorexia, nausea, vomiting, diarrhea  or change in bowel habits or change in bladder habits, change in stools or change in urine, dysuria, hematuria,  rash, arthralgias, visual complaints, headache, numbness, weakness or ataxia or problems with walking or coordination,  change in mood or  memory.              Past Medical History:  Diagnosis Date  . AICD (automatic cardioverter/defibrillator) present [address] [name] ICD, for SCD  . Allergic rhinitis, cause unspecified   . Anxiety   . Arthritis    "all over" (09/15/2016)  . ASCVD (arteriosclerotic cardiovascular disease)    70% mid left anterior descending lesion on cath in 06/1995; left anterior desending DES placed in 8/03 and RCA stent in 9/03; captain 3/05 revealed 90% second marginal for which PCI was performed, 70% PDA and a total obstruction of the first diagonal and marginal; sudden cardiac death in  [place] in 10/2003 for which automatic implantable cardiac defibrillator placed; negativ stress nuclear 10/07  . Atrial fibrillation ([place])   . Benign prostatic hypertrophy   . C. difficile diarrhea   . CAD (coronary artery disease) 1997  . CHF (congestive heart failure) ([place])   . Chronic lower back pain   . COPD (chronic obstructive pulmonary disease) ([place])   . Coronary artery disease    a. s/p CABG in 2013 with LIMA-LAD, SVG-D1, SVG-OM, and SVG-PDA b. DES to SVG-PDA in 2016 c. DES to mid-RCA in 08/2016  . CVD (cerebrovascular disease) 05/2008   Transient ischemic attack;  carotid ultrasound-plaque without focal disease  . Degenerative joint disease 2002   C-spine fusion   . Depression   . Diabetes mellitus without mention of complication   . Diabetic peripheral neuropathy ([place]) 09/06/2014  . Erectile dysfunction   . Esophageal reflux   . Headache    "monthly" (09/15/2016)  . HOH (hard of hearing)   . Hx-TIA (transient ischemic attack) 2010  . Hyperlipidemia   . Hypertension   . Memory deficits 09/05/2013  . Myocardial infarction ([place]) 1995  . Other testicular hypofunction   . Pacemaker   . S/P endoscopy Dec 2011   RMR: nl esophagus, hyperplastic polyp, active gastritis, no H.pylori.   . Shortness of breath   . Stroke [name]) 2014   denies residual on 09/15/2016  . Tobacco abuse    100 pack/year comsuption; cigarettes discontinued 2003; all tobacco products in 2008  . Tubular adenoma   . Type II diabetes mellitus (HCC)    Insulin requirement  . Vitreous floaters of left eye     Outpatient Medications Prior to Visit  Medication Sig Dispense Refill  .     2  .     0  . ALPRAZolam (XANAX) 0.5 MG tablet Take 1 tablet (0.5 mg total) by mouth daily. 60 tablet 3  . amLODipine (NORVASC) 2.5 MG tablet Take 1 tablet (2.5 mg total) by mouth daily. 90 tablet 1  . aspirin EC 81 MG tablet Take 81 mg by mouth daily.    Marland Kitchen atorvastatin (LIPITOR) 80 MG tablet TAKE ONE (1) TABLET EACH DAY 90 tablet 3  .     3  . buPROPion (WELLBUTRIN SR) 150 MG 12 hr tablet Take 1 tablet (150 mg total) by mouth daily. 30 tablet 5  . Ca Carbonate-Mag Hydroxide (ROLAIDS PO) Take 2 tablets as needed by mouth.    . clopidogrel (PLAVIX) 75 MG tablet TAKE ONE (1) TABLET BY MOUTH EVERY DAY 90 tablet 1  . donepezil (ARICEPT) 10 MG tablet TAKE ONE TABLET BY MOUTH AT BEDTIME. 90 tablet 3  . fexofenadine (ALLEGRA) 180 MG tablet Take 180 mg by mouth daily as needed for allergies.     Marland Kitchen guaiFENesin (MUCINEX) 600 MG 12 hr tablet Take 1,200 mg by mouth 2 (two) times daily.     Marland Kitchen HUMALOG KWIKPEN  100 UNIT/ML KiwkPen Inject 0.32-0.38 mLs (32-38 Units total) into the skin 3 (three) times daily. (Patient taking differently: Inject 30-36 Units 3 (three) times daily before meals into the skin. 70-199 = 30 units, 200-299 = 32 units, 300-399 = 34 units, >400 = 36 units) 30 pen 2  . HYDROcodone-acetaminophen (LORTAB) 7.5-500 MG tablet Take 1 tablet by mouth every 6 (six) hours as needed for pain.    . Insulin Detemir (LEVEMIR FLEXTOUCH) 100 UNIT/ML Pen Inject 90 Units into the skin daily at 10 pm. (Patient taking differently: Inject 90 Units  at bedtime into the skin. ) 30 pen 2  . JARDIANCE 25 MG TABS tablet Take 1 tablet daily by mouth.    . losartan (COZAAR) 50 MG tablet Take one half tablet by mouth daily. 90 tablet 2  . metoprolol tartrate (LOPRESSOR) 25 MG tablet TAKE 1 TABLET (25 MG TOTAL) BY MOUTH 2 TIMES DAILY. 180 tablet 3  . mirtazapine (REMERON) 15 MG tablet TAKE ONE TABLET (15MG  TOTAL) BY MOUTH ATBEDTIME 30 tablet 3  . nitroGLYCERIN (NITROSTAT) 0.4 MG SL tablet Place 1 tablet (0.4 mg total) under the tongue every 5 (five) minutes as needed. For chest pain (Patient taking differently: Place 0.4 mg under the tongue every 5 (five) minutes as needed. For chest pain) 25 tablet 3  . olopatadine (PATANOL) 0.1 % ophthalmic solution Place 1 drop into both eyes 2 (two) times daily. 5 mL 12  . ondansetron (ZOFRAN) 8 MG tablet One po Tid prn nausea 15 tablet 0  . pantoprazole (PROTONIX) 40 MG tablet TAKE ONE TABLET BY MOUTH TWICE A DAY 180 tablet 3  . Probiotic Product (ALIGN PO) Take 1 tablet by mouth daily.     . tamsulosin (FLOMAX) 0.4 MG CAPS capsule TAKE 1 CAPSULE BY MOUTH BY MOUTH AT BEDTIME 90 capsule 1  . vitamin B-12 (CYANOCOBALAMIN) 1000 MCG tablet Take 1,000 mcg by mouth daily. Reported on 08/25/2015        Objective:     Temp 98 F (36.7 C)   Ht 5\' 9"  (1.753 m)   Wt 220 lb (99.8 kg)   BMI 32.49 kg/m    amb wm with gruff voice, some rattling on voluntary cough   Vital signs  reviewed - Note on arrival 02 sats  98% on RA    HEENT:  Upper and lower partial dentures/ nl turbinates bilaterally, and oropharynx. Nl external ear canals without cough reflex   NECK :  without JVD/Nodes/TM/ nl carotid upstrokes bilaterally   LUNGS: no acc muscle use,  Nl contour chest  With classic pseudowheeze transmitted upper airway noises only   without cough on insp or exp maneuvers   CV:  RRR  no s3 or murmur or increase in P2, and no edema   ABD:  soft and nontender with nl inspiratory excursion in the supine position. No bruits or organomegaly appreciated, bowel sounds nl  MS:  Nl gait/ ext warm without deformities, calf tenderness, cyanosis or clubbing No obvious joint restrictions   SKIN: warm and dry without lesions    NEURO:  alert, approp, nl sensorium with  no motor or cerebellar deficits apparent.      I personally reviewed images and agree with radiology impression as follows:    CT Sinus (part of head ct) 06/28/18  wnl    . I personally reviewed images and agree with radiology impression as follows:  CXR:  06/28/18  No acute abnormalities    Labs ordered/ reviewed:      Chemistry      Component Value Date/Time   NA 137 10/03/2018 1011   NA 139 12/01/2016 1054   K 3.9 10/03/2018 1011   CL 103 10/03/2018 1011   CO2 25 10/03/2018 1011   BUN 21 10/03/2018 1011   BUN 10 12/01/2016 1054   CREATININE 1.10 10/03/2018 1011   CREATININE 1.00 11/21/2015 0753      Component Value Date/Time   CALCIUM 8.8 10/03/2018 1011   ALKPHOS 63 06/28/2018 1125   AST 19 06/28/2018 1125   ALT 22 06/28/2018  1125   BILITOT 0.4 06/28/2018 1125   BILITOT 0.3 08/23/2014 1141        Lab Results  Component Value Date   WBC 5.0 10/03/2018   HGB 14.1 10/03/2018   HCT 41.3 10/03/2018   MCV 88.2 10/03/2018   PLT 118.0 (L) 10/03/2018       EOS                                                               0.1                                   10/03/2018     Lab Results   Component Value Date   TSH 2.29 10/03/2018     Lab Results  Component Value Date   PROBNP 38.0 10/03/2018       Labs ordered 10/03/2018   Allergy profile     Assessment   Dyspnea on exertion Onset 2015 Echo 08/2013:  EF 45%, mod RV dysfxn, normal PA pressures, moderate LAE CXR 03/2014:  No acute process PFT"s 2013:  FEV1 2.96 (88%), ratio 76, TLC 6.75 (99%), DLCO 28.59 (92%) PFTss 03/2014:  FEV1 2.82 (86%), ratio 83, TLC 5.39 (79%), DLCO 22.89 (73%) - 10/03/2018   Walked RA  2 laps @  approx 223ft each @ nl pace  stopped due to  End of study, no sob or cough or desats    Unable to reproduce his chronic doe x 100 ft flat and slow x years   No evidence of copd/ asthma/ chf/ anemia or thyroid dz.  Strongly suspect deconditioning and component of uacs (see separate a/p)      Upper airway cough syndrome ? Onset p second neck sugery ? Early 2000's with neg w/u By [facility] including MBS/DgEs -  CT Sinus (part of head ct) 06/28/18  wnl  - 10/03/2018 reported at pulmonary eval continuous use of daytime peppermint > rec d/c   Of the three most common causes of  Sub-acute / recurrent or chronic cough, only one (GERD)  can actually contribute to/ trigger  the other two (asthma and post nasal drip syndrome)  and perpetuate the cylce of cough.  While not intuitively obvious, many patients with chronic low grade reflux do not cough until there is a primary insult that disturbs the protective epithelial barrier and exposes sensitive nerve endings.   This is typically viral but can due to PNDS or ET or neck sugery  and  Latter  may apply here.     >>>  The point is that once this occurs, it is difficult to eliminate the cycle  using anything but a maximally effective acid suppression regimen at least in the short run, accompanied by an appropriate diet to address non acid GERD and control / eliminate the cough itself by using non-mint/menthol hard rock candies and consider adding gabapentin on  return once we're sure exactly what he's taking and eliminate meds that aren't helping (esp inhalers which can actually make this problem worse in this case).   The standardized cough guidelines published in Chest by Lissa Morales in 2006 are still the best available and consist of a multiple step process (up to 12!) ,  not a single office visit,  and are intended  to address this problem logically,  with an alogrithm dependent on response to empiric treatment at  each progressive step  to determine a specific diagnosis with  minimal addtional testing needed. Therefore if adherence is an issue or can't be accurately verified,  it's very unlikely the standard evaluation and treatment will be successful here.    Furthermore, response to therapy (other than acute cough suppression, which should only be used short term with avoidance of narcotic containing cough syrups if possible), can be a gradual process for which the patient is not likely to  perceive immediate benefit.  Unlike going to an eye doctor where the best perscription is almost always the first one and is immediately effective, this is almost never the case in the management of chronic cough syndromes. Therefore the patient needs to commit up front to consistently adhere to recommendations  for up to 4- 6 weeks of therapy directed at the likely underlying problem(s) before the response can be reasonably evaluated   >>> rec return in 4 weeks with all meds in hand using a trust but verify approach to confirm accurate Medication  Reconciliation The principal here is that until we are certain that the  patients are doing what we've asked, it makes no sense to ask them to do more.      Total time devoted to counseling  > 50 % of initial 60 min office visit:  reviewedase with pt/ directly observed portions of ambulatory 02 saturation study/  discussion of options/alternatives/ personally creating written customized instructions  in presence of pt   then going over those specific  Instructions directly with the pt including how to use all of the meds but in particular covering each new medication in detail and the difference between the maintenance= "automatic" meds and the prns using an action plan format for the latter (If this problem/symptom => do that organization reading Left to right).  Please see AVS from this visit for a full list of these instructions which I personally wrote for this pt and  are unique to this visit.    Christinia Gully, MD 10/03/2018

## 2018-10-03 NOTE — Patient Instructions (Addendum)
Stop all mints and all inhalers  Increase Protonix 40 mg Take 30- 60 min before your first and last meals of the day    GERD (REFLUX)  is an extremely common cause of respiratory symptoms just like yours , many times with no obvious heartburn at all.    It can be treated with medication, but also with lifestyle changes including elevation of the head of your bed (ideally with 6 -8inch blocks under the headboard of your bed),  Smoking cessation, avoidance of late meals, excessive alcohol, and avoid fatty foods, chocolate, peppermint, colas, red wine, and acidic juices such as orange juice.  NO MINT OR MENTHOL PRODUCTS SO NO COUGH DROPS  USE SUGARLESS CANDY INSTEAD (Jolley ranchers or Stover's or Life Savers) or even ice chips will also do - the key is to swallow to prevent all throat clearing. NO OIL BASED VITAMINS - use powdered substitutes.  Avoid fish oil when coughing.    Please remember to go to the lab  department   for your tests - we will call you with the results when they are available.     Please schedule a follow up office visit in 4 weeks, sooner if needed  with all medications /inhalers/ solutions in hand so we can verify exactly what you are taking. This includes all medications from all doctors and over the counters

## 2018-10-04 ENCOUNTER — Encounter: Payer: Self-pay | Admitting: Internal Medicine

## 2018-10-04 DIAGNOSIS — R058 Other specified cough: Secondary | ICD-10-CM | POA: Insufficient documentation

## 2018-10-04 DIAGNOSIS — R05 Cough: Secondary | ICD-10-CM | POA: Insufficient documentation

## 2018-10-04 LAB — RESPIRATORY ALLERGY PROFILE REGION II ~~LOC~~
Allergen, A. alternata, m6: <0.1 kU/L
Allergen, Cedar tree, t12: 0.48 kU/L — ABNORMAL HIGH
Allergen, Comm Silver Birch, t9: 0.35 kU/L — ABNORMAL HIGH
Allergen, Cottonwood, t14: 0.43 kU/L — ABNORMAL HIGH
Allergen, D pternoyssinus,d7: 0.11 kU/L — ABNORMAL HIGH
Allergen, Mouse Urine Protein, e78: <0.1 kU/L
Allergen, Mulberry, t76: 0.33 kU/L — ABNORMAL HIGH
Allergen, Oak,t7: 0.41 kU/L — ABNORMAL HIGH
Allergen, P. notatum, m1: <0.1 kU/L
Aspergillus fumigatus, m3: <0.1 kU/L
Bermuda Grass: 0.41 kU/L — ABNORMAL HIGH
Box Elder IgE: 0.43 kU/L — ABNORMAL HIGH
CLADOSPORIUM HERBARUM (M2) IGE: <0.1 kU/L
COMMON RAGWEED (SHORT) (W1) IGE: 0.38 kU/L — ABNORMAL HIGH
Cat Dander: 0.13 kU/L — ABNORMAL HIGH
Class: 0
Class: 0
Class: 0
Class: 0
Class: 0
Class: 0
Class: 0
Class: 0
Class: 0
Class: 0
Class: 0
Class: 1
Class: 1
Class: 1
Class: 1
Class: 1
Class: 1
Class: 1
Class: 1
Class: 1
Class: 1
Class: 1
Class: 1
Class: 1
Cockroach: 0.39 kU/L — ABNORMAL HIGH
D. farinae: 0.2 kU/L — ABNORMAL HIGH
Dog Dander: 0.28 kU/L — ABNORMAL HIGH
Elm IgE: 0.42 kU/L — ABNORMAL HIGH
IgE (Immunoglobulin E), Serum: 104 kU/L (ref <=114)
Johnson Grass: 0.46 kU/L — ABNORMAL HIGH
Pecan/Hickory Tree IgE: 0.34 kU/L — ABNORMAL HIGH
Rough Pigweed  IgE: 0.36 kU/L — ABNORMAL HIGH
Sheep Sorrel IgE: 0.42 kU/L — ABNORMAL HIGH
Timothy Grass: 0.42 kU/L — ABNORMAL HIGH

## 2018-10-04 LAB — INTERPRETATION:

## 2018-10-04 NOTE — Progress Notes (Signed)
LMTCB

## 2018-10-04 NOTE — Assessment & Plan Note (Signed)
Onset 2015 Echo 08/2013:  EF 45%, mod RV dysfxn, normal PA pressures, moderate LAE CXR 03/2014:  No acute process PFT"s 2013:  FEV1 2.96 (88%), ratio 76, TLC 6.75 (99%), DLCO 28.59 (92%) PFTss 03/2014:  FEV1 2.82 (86%), ratio 83, TLC 5.39 (79%), DLCO 22.89 (73%) - 10/03/2018   Walked RA  2 laps @  approx 260ft each @ nl pace  stopped due to  End of study, no sob or cough or desats    Unable to reproduce his chronic doe x 100 ft flat and slow x years   No evidence of copd/ asthma/ chf/ anemia or thyroid dz.  Strongly suspect deconditioning and component of uacs (see separate a/p)

## 2018-10-04 NOTE — Assessment & Plan Note (Addendum)
? Onset p second neck sugery ? Early 2000's with neg w/u By [facility] including MBS/DgEs -  CT Sinus (part of head ct) 06/28/18  wnl  - 10/03/2018 reported at pulmonary eval continuous use of daytime peppermint > rec d/c     Of the three most common causes of  Sub-acute / recurrent or chronic cough, only one (GERD)  can actually contribute to/ trigger  the other two (asthma and post nasal drip syndrome)  and perpetuate the cylce of cough.  While not intuitively obvious, many patients with chronic low grade reflux do not cough until there is a primary insult that disturbs the protective epithelial barrier and exposes sensitive nerve endings.   This is typically viral but can due to PNDS or ET or neck sugery  and  Latter  may apply here.     >>>  The point is that once this occurs, it is difficult to eliminate the cycle  using anything but a maximally effective acid suppression regimen at least in the short run, accompanied by an appropriate diet to address non acid GERD and control / eliminate the cough itself by using non-mint/menthol hard rock candies and consider adding gabapentin on return once we're sure exactly what he's taking and eliminate meds that aren't helping (esp inhalers which can actually make this problem worse in this case).   The standardized cough guidelines published in Chest by Lissa Morales in 2006 are still the best available and consist of a multiple step process (up to 12!) , not a single office visit,  and are intended  to address this problem logically,  with an alogrithm dependent on response to empiric treatment at  each progressive step  to determine a specific diagnosis with  minimal addtional testing needed. Therefore if adherence is an issue or can't be accurately verified,  it's very unlikely the standard evaluation and treatment will be successful here.    Furthermore, response to therapy (other than acute cough suppression, which should only be used short term with  avoidance of narcotic containing cough syrups if possible), can be a gradual process for which the patient is not likely to  perceive immediate benefit.  Unlike going to an eye doctor where the best perscription is almost always the first one and is immediately effective, this is almost never the case in the management of chronic cough syndromes. Therefore the patient needs to commit up front to consistently adhere to recommendations  for up to 4- 6 weeks of therapy directed at the likely underlying problem(s) before the response can be reasonably evaluated   >>> rec return in 4 weeks with all meds in hand using a trust but verify approach to confirm accurate Medication  Reconciliation The principal here is that until we are certain that the  patients are doing what we've asked, it makes no sense to ask them to do more.     Total time devoted to counseling  > 50 % of initial 60 min office visit:  reviewedase with pt/ directly observed portions of ambulatory 02 saturation study/  discussion of options/alternatives/ personally creating written customized instructions  in presence of pt  then going over those specific  Instructions directly with the pt including how to use all of the meds but in particular covering each new medication in detail and the difference between the maintenance= "automatic" meds and the prns using an action plan format for the latter (If this problem/symptom => do that organization reading Left to right).  Please see AVS from this visit for a full list of these instructions which I personally wrote for this pt and  are unique to this visit.

## 2018-10-05 ENCOUNTER — Telehealth: Payer: Self-pay

## 2018-10-05 ENCOUNTER — Ambulatory Visit (INDEPENDENT_AMBULATORY_CARE_PROVIDER_SITE_OTHER): Payer: Medicare Other | Admitting: *Deleted

## 2018-10-05 DIAGNOSIS — I255 Ischemic cardiomyopathy: Secondary | ICD-10-CM

## 2018-10-05 DIAGNOSIS — I5022 Chronic systolic (congestive) heart failure: Secondary | ICD-10-CM | POA: Diagnosis not present

## 2018-10-05 NOTE — Telephone Encounter (Signed)
Pt aware of labs and next f/u recommendations. Pt aware if he wants to ok to move f/u appt up. Pt verbalized understanding. Went over new contact info and chart will be updated. Nothing further needed.

## 2018-10-05 NOTE — Telephone Encounter (Signed)
ATC [phone] pt cell as he was not home spouse cell # to contact. No voicemail.    Call patient : Studies are c/w mod allergies to everything but mold but nothing new to add to recs for now Be sure patient has/keeps f/u ov so we can go over all the details of this study and get a plan together moving forward - ok to move up f/u if not feeling better and wants to be seen sooner

## 2018-10-06 LAB — CUP PACEART REMOTE DEVICE CHECK
Date Time Interrogation Session: 20200605062330
Implantable Lead Implant Date: 20050706
Implantable Lead Implant Date: 20161128
Implantable Lead Location: 753859
Implantable Lead Location: 753860
Implantable Lead Model: 7122
Implantable Pulse Generator Implant Date: 20130727
Pulse Gen Serial Number: 7041246

## 2018-10-09 ENCOUNTER — Other Ambulatory Visit: Payer: Self-pay | Admitting: Cardiology

## 2018-10-09 ENCOUNTER — Other Ambulatory Visit: Payer: Self-pay | Admitting: Adult Health

## 2018-10-12 NOTE — Progress Notes (Signed)
Remote ICD transmission.   

## 2018-10-17 ENCOUNTER — Other Ambulatory Visit: Payer: Self-pay | Admitting: Cardiology

## 2018-10-23 ENCOUNTER — Other Ambulatory Visit: Payer: Self-pay

## 2018-10-23 ENCOUNTER — Other Ambulatory Visit: Payer: Self-pay | Admitting: Family Medicine

## 2018-10-23 ENCOUNTER — Encounter: Payer: Self-pay | Admitting: Internal Medicine

## 2018-10-23 ENCOUNTER — Ambulatory Visit (INDEPENDENT_AMBULATORY_CARE_PROVIDER_SITE_OTHER): Payer: Medicare Other | Admitting: Internal Medicine

## 2018-10-23 DIAGNOSIS — R05 Cough: Secondary | ICD-10-CM

## 2018-10-23 DIAGNOSIS — R0609 Other forms of dyspnea: Secondary | ICD-10-CM

## 2018-10-23 DIAGNOSIS — I255 Ischemic cardiomyopathy: Secondary | ICD-10-CM

## 2018-10-23 DIAGNOSIS — R0600 Dyspnea, unspecified: Secondary | ICD-10-CM

## 2018-10-23 DIAGNOSIS — R058 Other specified cough: Secondary | ICD-10-CM

## 2018-10-23 NOTE — Assessment & Plan Note (Signed)
Onset 2015 Echo 08/2013:  EF 45%, mod RV dysfxn, normal PA pressures, moderate LAE CXR 03/2014:  No acute process PFT"s 2013:  FEV1 2.96 (88%), ratio 76, TLC 6.75 (99%), DLCO 28.59 (92%) PFTss 03/2014:  FEV1 2.82 (86%), ratio 83, TLC 5.39 (79%), DLCO 22.89 (73%) - 10/03/2018   Walked RA  2 laps @  approx 251ft each @ nl pace  stopped due to  End of study, no sob or cough or desats   10/23/2018 reported "as long as walk in straight line, no sob at all, just has it if turns head assoc with dizziness  - PFT's   Assoc with pseudowheeze so could have upper airway obstruction and best way to look at physiologic impact is pfts with f/v loop and direct viz per ent > rec we do both to complete the w/u  In meantime ok to d/c rx for Asthma/ copd

## 2018-10-23 NOTE — Assessment & Plan Note (Addendum)
?   Onset p second neck sugery ? Early 2000's with neg w/u By [facility] including MBS/DgEs -  CT Sinus (part of head ct) 06/28/18  wnl  - 10/03/2018 reported at pulmonary eval continuous use of daytime peppermint > rec d/c  - Allergy profile 10/03/2018 >  Eos 0.1/  IgE  104  RAST pos to everything but mold   Problem is persistent globus no better on max rx for gerd, off peppermint and no response to dulera so strongly doubt any asthma here but rather Upper airway cough syndrome (previously labeled PNDS),  is so named because it's frequently impossible to sort out how much is  CR/sinusitis with freq throat clearing (which can be related to primary GERD)   vs  causing  secondary (" extra esophageal")  GERD from wide swings in gastric pressure that occur with throat clearing, often  promoting self use of mint and menthol lozenges that reduce the lower esophageal sphincter tone and exacerbate the problem further in a cyclical fashion.   These are the same pts (now being labeled as having "irritable larynx syndrome" by some cough centers) who not infrequently have a history of having failed to tolerate ace inhibitors,  dry powder inhalers or biphosphonates or report having atypical/extraesophageal reflux symptoms that don't respond to standard doses of PPI  and are easily confused as having aecopd or asthma flares by even experienced allergists/ pulmonologists (myself included).    rec Continue max rx for gerd F/u [facility] or refer to Carol Ada at [facility] voice center   I had an extended discussion with the patient reviewing all relevant studies completed to date and  lasting 15 to 20 minutes of a 25 minute visit    Each maintenance medication was reviewed in detail including most importantly the difference between maintenance and prns and under what circumstances the prns are to be triggered using an action plan format that is not reflected in the computer generated alphabetically organized AVS.     Please  see AVS for specific instructions unique to this visit that I personally wrote and verbalized to the the pt in detail and then reviewed with pt  by my nurse highlighting any  changes in therapy recommended at today's visit to their plan of care.

## 2018-10-23 NOTE — Patient Instructions (Addendum)
Ok stop all inhalers and nebulizers at this point as you don't feel they are helping  We will be referring you back to Dr Mare Loan for your sensation of something stuck in your throat   We will also schedule a COVID test then pfts here with follow up office visit at your convenience

## 2018-10-23 NOTE — Progress Notes (Signed)
Lance [name], male    DOB: [birth year], 73 y.o.   MRN: [id number]   Brief patient profile:  72 yowm quit smoking 2000 due to heart with nl pfts as of 03/21/14 referred to pulmonary clinic 10/03/2018 by Dr   Wolfgang Phoenix for cough x around 2013 (? p 2 neck surgeries)  never resolved (w/u by [facility] [id number] neg )  then doe 2015 and already had cards eval with last [facility] 07/2018:  ostial LAD occluded, LCX occluded, RCA mid 75%. RPDA 90% chronic. LIMA-LAD patent, SVG-OM patent, SVG-diag patent,  SVG-RPDA occluded. The RCA was ballooned. LVEDP 14 so referred to pulmonary.  Note neg w/u for lung dz by [facility] in 2016 and no response to spiriva so d/c'd    History of Present Illness  10/03/2018  Pulmonary/ 1st office eval/[facility]  Chief Complaint  Patient presents with  . Shortness of Breath    Also feels like he is choking when eating. Will get tired very easily, has been seen by cardiology.   Dyspnea:  100 ft / slow = MMRC3 = can't walk 100 yards even at a slow pace at a flat grade s stopping due to sob  s variability Cough: dry hack 24/7 / globus sensations with dysphagia  / does not exac in am  Sleep: flat bed/ one pillow / some choking / smothering at hs / SABA use: no better and worse cough with  symbicort  so not using,  even neb does not help Uses allegra daily, not clearly helping "keep a mint in my mouth all the time" and seems to help throat "feel better" but not the cough rec Stop all mints and all inhalers Increase Protonix 40 mg Take 30- 60 min before your first and last meals of the day  GERD diet   10/23/2018  f/u ov/[facility] re: sob Chief Complaint  Patient presents with  . Follow-up    Breathing has not improved. He stopped using rescue inhaler and neb b/c they were not helping.   Dyspnea:  Straight line no trouble with doe, some problem with steps so = MMRC1 = can walk nl pace, flat grade, can't hurry or go uphills or steps s sob   Cough: globus sensation, not really coughing  Sleeping: 2-3  nights per weeks wakes up with choking / smothering and gets up and walks around and back to sleep ok SABA use: not helping  02: none    No obvious day to day or daytime variability or assoc excess/ purulent sputum or mucus plugs or hemoptysis or cp or chest tightness, subjective wheeze or overt sinus or hb symptoms.    Also denies any obvious fluctuation of symptoms with weather or environmental changes or other aggravating or alleviating factors except as outlined above   No unusual exposure hx or h/o childhood pna/ asthma or knowledge of premature birth.  Current Allergies, Complete Past Medical History, Past Surgical History, Family History, and Social History were reviewed in [employer] record.  ROS  The following are not active complaints unless bolded Hoarseness, sore throat, dysphagia=globus, dental problems, itching, sneezing,  nasal congestion or discharge of excess mucus or purulent secretions, ear ache,   fever, chills, sweats, unintended wt loss or wt gain, classically pleuritic or exertional cp,  orthopnea pnd or arm/hand swelling  or leg swelling, presyncope, palpitations, abdominal pain, anorexia, nausea, vomiting, diarrhea  or change in bowel habits or change in bladder habits, change in stools or change in urine, dysuria,  hematuria,  rash, arthralgias, visual complaints, headache, numbness, weakness or ataxia or problems with walking or coordination,  change in mood or  memory.        Current Meds  Medication Sig  . albuterol (PROVENTIL HFA;VENTOLIN HFA) 108 (90 BASE) MCG/ACT inhaler Inhale 2 puffs into the lungs every 6 (six) hours as needed for wheezing.  Marland Kitchen albuterol (PROVENTIL) (2.5 MG/3ML) 0.083% nebulizer solution Take 3 mLs (2.5 mg total) by nebulization every 6 (six) hours as needed for wheezing or shortness of breath.  . ALPRAZolam (XANAX) 0.5 MG tablet Take 1 tablet (0.5 mg total) by mouth daily.  Marland Kitchen amLODipine (NORVASC) 2.5 MG tablet Take 1  tablet (2.5 mg total) by mouth daily.  Marland Kitchen aspirin EC 81 MG tablet Take 81 mg by mouth daily.  Marland Kitchen atorvastatin (LIPITOR) 80 MG tablet TAKE ONE (1) TABLET BY MOUTH EVERY DAY  . buPROPion (WELLBUTRIN SR) 150 MG 12 hr tablet Take 1 tablet (150 mg total) by mouth daily.  . clopidogrel (PLAVIX) 75 MG tablet TAKE ONE (1) TABLET BY MOUTH EVERY DAY  . dextromethorphan-guaiFENesin (MUCINEX DM) 30-600 MG 12hr tablet Take 2 tablets by mouth 2 (two) times daily as needed for cough.  . donepezil (ARICEPT) 10 MG tablet TAKE ONE TABLET BY MOUTH EVERY NIGHT AT BEDTIME  . fexofenadine (ALLEGRA) 180 MG tablet Take 180 mg by mouth daily as needed for allergies.   Marland Kitchen HUMALOG KWIKPEN 100 UNIT/ML KiwkPen Inject 0.32-0.38 mLs (32-38 Units total) into the skin 3 (three) times daily. (Patient taking differently: Inject 30-36 Units 3 (three) times daily before meals into the skin. 70-199 = 30 units, 200-299 = 32 units, 300-399 = 34 units, >400 = 36 units)  . HYDROcodone-acetaminophen (LORTAB) 7.5-500 MG tablet Take 1 tablet by mouth every 6 (six) hours as needed for pain.  . Insulin Detemir (LEVEMIR FLEXTOUCH) 100 UNIT/ML Pen Inject 90 Units into the skin daily at 10 pm. (Patient taking differently: Inject 90 Units at bedtime into the skin. )  . JARDIANCE 25 MG TABS tablet Take 1 tablet daily by mouth.  . losartan (COZAAR) 50 MG tablet Take one half tablet by mouth daily.  . metoprolol tartrate (LOPRESSOR) 25 MG tablet TAKE 1 TABLET (25 MG TOTAL) BY MOUTH 2 TIMES DAILY.  . mirtazapine (REMERON) 15 MG tablet TAKE ONE TABLET (15MG  TOTAL) BY MOUTH ATBEDTIME  . mometasone-formoterol (DULERA) 200-5 MCG/ACT AERO Inhale 2 puffs into the lungs 2 (two) times daily.  . nitroGLYCERIN (NITROSTAT) 0.4 MG SL tablet PLACE ONE TABLET UNDER THE TONGUE EVERY FIVE MINUTES AS NEEDED FOR CHEST PAIN  . pantoprazole (PROTONIX) 40 MG tablet TAKE ONE TABLET BY MOUTH TWICE A DAY  . Probiotic Product (ALIGN PO) Take 1 tablet by mouth daily.   .  tamsulosin (FLOMAX) 0.4 MG CAPS capsule TAKE 1 CAPSULE BY MOUTH BY MOUTH AT BEDTIME  . vitamin B-12 (CYANOCOBALAMIN) 1000 MCG tablet Take 1,000 mcg by mouth daily. Reported on 08/25/2015              Past Medical History:  Diagnosis Date  . AICD (automatic cardioverter/defibrillator) present [address] [name] ICD, for SCD  . Allergic rhinitis, cause unspecified   . Anxiety   . Arthritis    "all over" (09/15/2016)  . ASCVD (arteriosclerotic cardiovascular disease)    70% mid left anterior descending lesion on cath in 06/1995; left anterior desending DES placed in 8/03 and RCA stent in 9/03; captain 3/05 revealed 90% second marginal for which PCI was  performed, 70% PDA and a total obstruction of the first diagonal and marginal; sudden cardiac death in [place] in 11-13-03 for which automatic implantable cardiac defibrillator placed; negativ stress nuclear 10/07  . Atrial fibrillation ([place])   . Benign prostatic hypertrophy   . C. difficile diarrhea   . CAD (coronary artery disease) 1997  . CHF (congestive heart failure) ([place])   . Chronic lower back pain   . COPD (chronic obstructive pulmonary disease) ([place])   . Coronary artery disease    a. s/p CABG in 2013 with LIMA-LAD, SVG-D1, SVG-OM, and SVG-PDA b. DES to SVG-PDA in 2016 c. DES to mid-RCA in 08/2016  . CVD (cerebrovascular disease) 05/2008   Transient ischemic attack; carotid ultrasound-plaque without focal disease  . Degenerative joint disease 2002   C-spine fusion   . Depression   . Diabetes mellitus without mention of complication   . Diabetic peripheral neuropathy ([place]) 09/06/2014  . Erectile dysfunction   . Esophageal reflux   . Headache    "monthly" (09/15/2016)  . HOH (hard of hearing)   . Hx-TIA (transient ischemic attack) 2010  . Hyperlipidemia   . Hypertension   . Memory deficits 09/05/2013  . Myocardial infarction ([place]) 1995  . Other testicular hypofunction   . Pacemaker   . S/P endoscopy Dec 2011   RMR: nl esophagus,  hyperplastic polyp, active gastritis, no H.pylori.   . Shortness of breath   . Stroke [facility]) 2014   denies residual on 09/15/2016  . Tobacco abuse    100 pack/year comsuption; cigarettes discontinued 2003; all tobacco products in 2008  . Tubular adenoma   . Type II diabetes mellitus ([facility])    Insulin requirement  . Vitreous floaters of left eye       Objective:    Wt Readings from Last 3 Encounters:  10/23/18 216 lb (98 kg)  10/03/18 220 lb (99.8 kg)  08/18/18 222 lb (100.7 kg)     Vital signs reviewed - Note on arrival 02 sats  97% on RA         somber wm nad/ no throat clearing   HEENT: upper and lower parital dentures, nl  turbinates bilaterally, and oropharynx. Nl external ear canals without cough reflex   NECK :  without JVD/Nodes/TM/ nl carotid upstrokes bilaterally   LUNGS: no acc muscle use,  Nl contour chest which is clear to A and P bilaterally without cough on insp or exp maneuvers/ minimal pseudowheeze    CV:  RRR  no s3 or murmur or increase in P2, and no edema   ABD:  soft and nontender with nl inspiratory excursion in the supine position. No bruits or organomegaly appreciated, bowel sounds nl  MS:  Nl gait/ ext warm without deformities, calf tenderness, cyanosis or clubbing No obvious joint restrictions   SKIN: warm and dry without lesions    NEURO:  alert, approp, nl sensorium with  no motor or cerebellar deficits apparent.       Assessment

## 2018-11-01 ENCOUNTER — Ambulatory Visit: Payer: Medicare Other | Admitting: Internal Medicine

## 2018-11-02 ENCOUNTER — Telehealth: Payer: Self-pay | Admitting: Cardiology

## 2018-11-02 NOTE — Telephone Encounter (Signed)
Pt's wife lvm wanting to know if we had the surgical clearance for pt to have shoulder surgery

## 2018-11-02 NOTE — Telephone Encounter (Signed)
Faxed office note to [facility]

## 2018-11-06 DIAGNOSIS — M542 Cervicalgia: Secondary | ICD-10-CM | POA: Diagnosis not present

## 2018-11-06 DIAGNOSIS — G894 Chronic pain syndrome: Secondary | ICD-10-CM | POA: Diagnosis not present

## 2018-11-06 DIAGNOSIS — M791 Myalgia, unspecified site: Secondary | ICD-10-CM | POA: Diagnosis not present

## 2018-11-06 DIAGNOSIS — Z79891 Long term (current) use of opiate analgesic: Secondary | ICD-10-CM | POA: Diagnosis not present

## 2018-11-06 DIAGNOSIS — Z79899 Other long term (current) drug therapy: Secondary | ICD-10-CM | POA: Diagnosis not present

## 2018-11-15 DIAGNOSIS — R05 Cough: Secondary | ICD-10-CM | POA: Diagnosis not present

## 2018-11-15 DIAGNOSIS — R1312 Dysphagia, oropharyngeal phase: Secondary | ICD-10-CM | POA: Diagnosis not present

## 2018-11-15 DIAGNOSIS — R42 Dizziness and giddiness: Secondary | ICD-10-CM | POA: Diagnosis not present

## 2018-11-15 DIAGNOSIS — J342 Deviated nasal septum: Secondary | ICD-10-CM | POA: Diagnosis not present

## 2018-11-15 DIAGNOSIS — R1314 Dysphagia, pharyngoesophageal phase: Secondary | ICD-10-CM | POA: Diagnosis not present

## 2018-11-15 DIAGNOSIS — K219 Gastro-esophageal reflux disease without esophagitis: Secondary | ICD-10-CM | POA: Diagnosis not present

## 2018-11-15 DIAGNOSIS — H8110 Benign paroxysmal vertigo, unspecified ear: Secondary | ICD-10-CM | POA: Diagnosis not present

## 2018-11-16 ENCOUNTER — Other Ambulatory Visit: Payer: Self-pay | Admitting: Family Medicine

## 2018-11-16 ENCOUNTER — Other Ambulatory Visit: Payer: Self-pay | Admitting: Otolaryngology

## 2018-11-16 DIAGNOSIS — R1312 Dysphagia, oropharyngeal phase: Secondary | ICD-10-CM

## 2018-11-21 ENCOUNTER — Other Ambulatory Visit: Payer: Medicare Other

## 2018-11-22 DIAGNOSIS — Z79899 Other long term (current) drug therapy: Secondary | ICD-10-CM | POA: Diagnosis not present

## 2018-11-22 DIAGNOSIS — Z794 Long term (current) use of insulin: Secondary | ICD-10-CM | POA: Diagnosis not present

## 2018-11-22 DIAGNOSIS — E1165 Type 2 diabetes mellitus with hyperglycemia: Secondary | ICD-10-CM | POA: Diagnosis not present

## 2018-11-22 DIAGNOSIS — I2510 Atherosclerotic heart disease of native coronary artery without angina pectoris: Secondary | ICD-10-CM | POA: Diagnosis not present

## 2018-11-22 DIAGNOSIS — E1142 Type 2 diabetes mellitus with diabetic polyneuropathy: Secondary | ICD-10-CM | POA: Diagnosis not present

## 2018-11-27 ENCOUNTER — Ambulatory Visit
Admission: RE | Admit: 2018-11-27 | Discharge: 2018-11-27 | Disposition: A | Discharge status: Home / Self Care | Payer: Medicare Other | Source: Ambulatory Visit | Attending: Otolaryngology | Admitting: Otolaryngology

## 2018-11-27 ENCOUNTER — Other Ambulatory Visit: Payer: Self-pay | Admitting: Internal Medicine

## 2018-11-27 DIAGNOSIS — R1310 Dysphagia, unspecified: Secondary | ICD-10-CM | POA: Diagnosis not present

## 2018-11-27 DIAGNOSIS — R1312 Dysphagia, oropharyngeal phase: Secondary | ICD-10-CM

## 2018-11-28 DIAGNOSIS — B351 Tinea unguium: Secondary | ICD-10-CM | POA: Diagnosis not present

## 2018-11-28 DIAGNOSIS — E1142 Type 2 diabetes mellitus with diabetic polyneuropathy: Secondary | ICD-10-CM | POA: Diagnosis not present

## 2018-11-29 DIAGNOSIS — H903 Sensorineural hearing loss, bilateral: Secondary | ICD-10-CM | POA: Diagnosis not present

## 2018-11-29 DIAGNOSIS — K219 Gastro-esophageal reflux disease without esophagitis: Secondary | ICD-10-CM | POA: Diagnosis not present

## 2018-11-29 DIAGNOSIS — R42 Dizziness and giddiness: Secondary | ICD-10-CM | POA: Diagnosis not present

## 2018-11-29 DIAGNOSIS — R05 Cough: Secondary | ICD-10-CM | POA: Diagnosis not present

## 2018-12-01 ENCOUNTER — Telehealth: Payer: Self-pay | Admitting: Family Medicine

## 2018-12-01 NOTE — Telephone Encounter (Signed)
Lance [name] states she filled out form and put in dr scott's folder to sign

## 2018-12-01 NOTE — Telephone Encounter (Signed)
Form dropped off to be signed for renewal of disability parking placard. Form placed in nurse box at nurse station.
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
## 2018-12-04 ENCOUNTER — Other Ambulatory Visit: Payer: Self-pay

## 2018-12-04 ENCOUNTER — Encounter: Payer: Self-pay | Admitting: Neurology

## 2018-12-04 ENCOUNTER — Ambulatory Visit (INDEPENDENT_AMBULATORY_CARE_PROVIDER_SITE_OTHER): Payer: Medicare Other | Admitting: Neurology

## 2018-12-04 ENCOUNTER — Ambulatory Visit: Payer: Self-pay | Admitting: Adult Health

## 2018-12-04 ENCOUNTER — Other Ambulatory Visit: Payer: Self-pay | Admitting: Family Medicine

## 2018-12-04 VITALS — BP 98/61 | HR 66 | Temp 97.3°F | Ht 69.0 in | Wt 223.0 lb

## 2018-12-04 DIAGNOSIS — I255 Ischemic cardiomyopathy: Secondary | ICD-10-CM

## 2018-12-04 DIAGNOSIS — R413 Other amnesia: Secondary | ICD-10-CM

## 2018-12-04 NOTE — Progress Notes (Signed)
I have read the note, and I agree with the clinical assessment and plan.  Lance [name]   

## 2018-12-04 NOTE — Patient Instructions (Signed)
You may try taking Aricept in the morning, instead of at night to see if that helps with dreams at night. In the future we may consider addition of Namenda for your memory if your dizziness improves.

## 2018-12-04 NOTE — Telephone Encounter (Signed)
Form was filled out thank you 

## 2018-12-05 ENCOUNTER — Other Ambulatory Visit (HOSPITAL_COMMUNITY)
Admission: RE | Admit: 2018-12-05 | Discharge: 2018-12-05 | Disposition: A | Discharge status: Home / Self Care | Payer: Medicare Other | Source: Ambulatory Visit | Attending: Internal Medicine | Admitting: Internal Medicine

## 2018-12-05 DIAGNOSIS — Z20828 Contact with and (suspected) exposure to other viral communicable diseases: Secondary | ICD-10-CM | POA: Diagnosis not present

## 2018-12-05 DIAGNOSIS — Z01812 Encounter for preprocedural laboratory examination: Secondary | ICD-10-CM | POA: Diagnosis not present

## 2018-12-05 LAB — SARS CORONAVIRUS 2 (TAT 6-24 HRS): SARS Coronavirus 2: NEGATIVE

## 2018-12-07 ENCOUNTER — Other Ambulatory Visit: Payer: Self-pay | Admitting: Internal Medicine

## 2018-12-07 DIAGNOSIS — R0609 Other forms of dyspnea: Secondary | ICD-10-CM

## 2018-12-07 DIAGNOSIS — R0600 Dyspnea, unspecified: Secondary | ICD-10-CM

## 2018-12-08 ENCOUNTER — Ambulatory Visit (INDEPENDENT_AMBULATORY_CARE_PROVIDER_SITE_OTHER): Payer: Medicare Other | Admitting: Primary Care

## 2018-12-08 ENCOUNTER — Ambulatory Visit (INDEPENDENT_AMBULATORY_CARE_PROVIDER_SITE_OTHER): Payer: Medicare Other | Admitting: Internal Medicine

## 2018-12-08 ENCOUNTER — Encounter: Payer: Self-pay | Admitting: Primary Care

## 2018-12-08 ENCOUNTER — Ambulatory Visit: Payer: Medicare Other | Admitting: Pulmonary Disease

## 2018-12-08 ENCOUNTER — Ambulatory Visit: Payer: Medicare Other | Admitting: Internal Medicine

## 2018-12-08 ENCOUNTER — Other Ambulatory Visit: Payer: Self-pay

## 2018-12-08 DIAGNOSIS — R0609 Other forms of dyspnea: Secondary | ICD-10-CM

## 2018-12-08 DIAGNOSIS — R0600 Dyspnea, unspecified: Secondary | ICD-10-CM

## 2018-12-08 DIAGNOSIS — I255 Ischemic cardiomyopathy: Secondary | ICD-10-CM | POA: Diagnosis not present

## 2018-12-08 LAB — PULMONARY FUNCTION TEST
DL/VA % pred: 105 %
DL/VA: 4.29 ml/min/mmHg/L
DLCO unc % pred: 94 %
DLCO unc: 23.78 ml/min/mmHg
FEF 25-75 Post: 3.9 L/sec
FEF 25-75 Pre: 2.59 L/sec
FEF2575-%Change-Post: 50 %
FEF2575-%Pred-Post: 163 %
FEF2575-%Pred-Pre: 108 %
FEV1-%Change-Post: 9 %
FEV1-%Pred-Post: 99 %
FEV1-%Pred-Pre: 90 %
FEV1-Post: 3.1 L
FEV1-Pre: 2.83 L
FEV1FVC-%Change-Post: 3 %
FEV1FVC-%Pred-Pre: 107 %
FEV6-%Change-Post: 6 %
FEV6-%Pred-Post: 94 %
FEV6-%Pred-Pre: 89 %
FEV6-Post: 3.79 L
FEV6-Pre: 3.57 L
FEV6FVC-%Change-Post: 0 %
FEV6FVC-%Pred-Post: 105 %
FEV6FVC-%Pred-Pre: 105 %
FVC-%Change-Post: 5 %
FVC-%Pred-Post: 89 %
FVC-%Pred-Pre: 84 %
FVC-Post: 3.81 L
FVC-Pre: 3.59 L
Post FEV1/FVC ratio: 81 %
Post FEV6/FVC ratio: 100 %
Pre FEV1/FVC ratio: 79 %
Pre FEV6/FVC Ratio: 99 %

## 2018-12-08 MED ORDER — ALBUTEROL SULFATE HFA 108 (90 BASE) MCG/ACT IN AERS: 2.0000 | INHALATION_SPRAY | Freq: Four times a day (QID) | RESPIRATORY_TRACT | 1 refills | Status: DC | PRN

## 2018-12-08 MED ORDER — SPIRIVA RESPIMAT 2.5 MCG/ACT IN AERS: 2.0000 | INHALATION_SPRAY | Freq: Every day | RESPIRATORY_TRACT | 0 refills | Status: DC

## 2018-12-08 MED ORDER — PREDNISONE 10 MG PO TABS: ORAL_TABLET | ORAL | 0 refills | Status: DC

## 2018-12-08 NOTE — Progress Notes (Signed)
PFT done today. 

## 2018-12-08 NOTE — Assessment & Plan Note (Addendum)
-   Continues to have moderate dyspnea on exertion with cough - PFTs FEV1 3.10 (99), ratio 81, no BD response, normal DLCO, TLC 73%, mild curvature on flow volume loop - No improvement with ICS/LABA - Trial LAMA such as Spiriva respimat 2.5 and prednisone taper - If no improvement recommend HRCT d/t symptoms and rales on exam to r/o ILD

## 2018-12-08 NOTE — Patient Instructions (Addendum)
Rx: Trial Spiriva - take 2 puffs once daily (sample given) Albuterol rescue inahler- take 2 puffs every 6 hours as needed only for shortness of breath  Follow up: 4 weeks with Dr. Melvyn Novas or Eustaquio Maize NP

## 2018-12-08 NOTE — Progress Notes (Signed)
@Patient  ID: Lance [name], male    DOB: 1946/04/27, 73 y.o.   MRN: [id number]  Chief Complaint  Patient presents with  . Follow-up    PFT results - still SOB and cong cough (white)     Referring provider: [name], MD  HPI: 73 year old male, former smoker (120 pack year hx). PMH significant for upper airway cough syndrome, mild asthma, GERD, allergic rhinitis, CAD, CHF, HTN, type 2 diabetes, angina, ventricular fibrillation. Patient of Dr. Melvyn Novas, last seen on 10/23/18.   12/08/2018 Patient presents today for PFTs. He continues to complain of shortness of breath and cough. He has been experiencing shortness of breath for several year but worsening recently especially on exertion. States that after he showers he has to sit down cause he is out of breath and has broken out in a sweat. He has tried Symbicort in the past and was no better using it. Cough is constant, he does have to clear this throat often. Get of white mucus. He is still active and walks throughout the day. He is very tired and sob at the end of the day.  Allergies  Allergen Reactions  . Morphine And Related Other (See Comments)    hallucinations  . Penicillins Hives    Can take cefzil Has patient had a PCN reaction causing immediate rash, facial/tongue/throat swelling, SOB or lightheadedness with hypotension:unsure Has patient had a PCN reaction causing severe rash involving mucus membranes or skin necrosis:unsure Has patient had a PCN reaction that required hospitalization:unsure Has patient had a PCN reaction occurring within the last 10 years:No If all of the above answers are "NO", then may proceed with Cephalosporin use. Childhood reaction.  Marland Kitchen Percocet [Oxycodone-Acetaminophen] Other (See Comments)    hallucinations  . Latex Rash  . Levaquin [Levofloxacin In D5w] Itching  . Metformin And Related Other (See Comments)    In high doses, causes diarrhea abdominal bloating   . Tape Rash    Immunization  History  Administered Date(s) Administered  . Influenza Split 02/14/2013  . Influenza Whole 01/30/2009, 01/26/2010  . Influenza-Unspecified 02/23/2014, 01/29/2015, 03/04/2017  . Pneumococcal Conjugate-13 12/24/2013, 08/21/2015  . Pneumococcal-Unspecified 01/31/1999  . Td 01/21/2009, 09/16/2014    Past Medical History:  Diagnosis Date  . AICD (automatic cardioverter/defibrillator) present [address] [name] ICD, for SCD  . Allergic rhinitis, cause unspecified   . Anxiety   . Arthritis    "all over" (09/15/2016)  . ASCVD (arteriosclerotic cardiovascular disease)    70% mid left anterior descending lesion on cath in 06/1995; left anterior desending DES placed in 8/03 and RCA stent in 9/03; captain 3/05 revealed 90% second marginal for which PCI was performed, 70% PDA and a total obstruction of the first diagonal and marginal; sudden cardiac death in [place] in 11-17-03 for which automatic implantable cardiac defibrillator placed; negativ stress nuclear 10/07  . Atrial fibrillation ([place])   . Benign prostatic hypertrophy   . C. difficile diarrhea   . CAD (coronary artery disease) 1997  . CHF (congestive heart failure) ([place])   . Chronic lower back pain   . COPD (chronic obstructive pulmonary disease) ([place])   . Coronary artery disease    a. s/p CABG in 2013 with LIMA-LAD, SVG-D1, SVG-OM, and SVG-PDA b. DES to SVG-PDA in 2016 c. DES to mid-RCA in 08/2016  . CVD (cerebrovascular disease) 05/2008   Transient ischemic attack; carotid ultrasound-plaque without focal disease  . Degenerative joint disease 2002   C-spine fusion   .  Depression   . Diabetes mellitus without mention of complication   . Diabetic peripheral neuropathy ([place]) 09/06/2014  . Erectile dysfunction   . Esophageal reflux   . Headache    "monthly" (09/15/2016)  . HOH (hard of hearing)   . Hx-TIA (transient ischemic attack) 2010  . Hyperlipidemia   . Hypertension   . Memory deficits 09/05/2013  . Myocardial infarction ([place])  1995  . Other testicular hypofunction   . Pacemaker   . S/P endoscopy Dec 2011   RMR: nl esophagus, hyperplastic polyp, active gastritis, no H.pylori.   . Shortness of breath   . Stroke [facility]) 2014   denies residual on 09/15/2016  . Tobacco abuse    100 pack/year comsuption; cigarettes discontinued 2003; all tobacco products in 2008  . Tubular adenoma   . Type II diabetes mellitus ([facility])    Insulin requirement  . Vitreous floaters of left eye     Tobacco History: Social History   Tobacco Use  Smoking Status Former Smoker  . Packs/day: 3.00  . Years: 40.00  . Pack years: 120.00  . Types: Cigarettes  . Start date: 05/03/1954  . Quit date: 05/03/1998  . Years since quitting: 20.6  Smokeless Tobacco Current User  . Types: Chew   Ready to quit: Not Answered Counseling given: Not Answered   Outpatient Medications Prior to Visit  Medication Sig Dispense Refill  . ALPRAZolam (XANAX) 0.5 MG tablet Take 1 tablet (0.5 mg total) by mouth daily. 60 tablet 3  . amLODipine (NORVASC) 2.5 MG tablet Take 1 tablet (2.5 mg total) by mouth daily. 90 tablet 1  . aspirin EC 81 MG tablet Take 81 mg by mouth daily.    Marland Kitchen atorvastatin (LIPITOR) 80 MG tablet TAKE ONE (1) TABLET BY MOUTH EVERY DAY 90 tablet 3  . buPROPion (WELLBUTRIN SR) 150 MG 12 hr tablet TAKE ONE TABLET (150MG  TOTAL) BY MOUTH DAILY 30 tablet 5  . clopidogrel (PLAVIX) 75 MG tablet TAKE ONE (1) TABLET BY MOUTH EVERY DAY 90 tablet 0  . dextromethorphan-guaiFENesin (MUCINEX DM) 30-600 MG 12hr tablet Take 2 tablets by mouth 2 (two) times daily as needed for cough.    . donepezil (ARICEPT) 10 MG tablet TAKE ONE TABLET BY MOUTH EVERY NIGHT AT BEDTIME 90 tablet 3  . fexofenadine (ALLEGRA) 180 MG tablet Take 180 mg by mouth daily as needed for allergies.     Marland Kitchen HUMALOG KWIKPEN 100 UNIT/ML KiwkPen Inject 0.32-0.38 mLs (32-38 Units total) into the skin 3 (three) times daily. (Patient taking differently: Inject 30-36 Units 3 (three) times daily  before meals into the skin. 70-199 = 30 units, 200-299 = 32 units, 300-399 = 34 units, >400 = 36 units) 30 pen 2  . HYDROcodone-acetaminophen (LORTAB) 7.5-500 MG tablet Take 1 tablet by mouth every 6 (six) hours as needed for pain.    Marland Kitchen HYDROcodone-Acetaminophen 10-300 MG/15ML SOLN Take by mouth.    . Insulin Detemir (LEVEMIR FLEXTOUCH) 100 UNIT/ML Pen Inject 90 Units into the skin daily at 10 pm. (Patient taking differently: Inject 90 Units at bedtime into the skin. ) 30 pen 2  . Insulin Pen Needle (B-D ULTRAFINE III SHORT PEN) 31G X 8 MM MISC USE 1 NEEDLE 4 TIMES DAILY AS DIRECTED    . JARDIANCE 25 MG TABS tablet Take 1 tablet daily by mouth.    . losartan (COZAAR) 50 MG tablet Take one half tablet by mouth daily. 90 tablet 2  . metoprolol tartrate (LOPRESSOR) 25 MG tablet TAKE  1 TABLET (25 MG TOTAL) BY MOUTH 2 TIMES DAILY. 180 tablet 3  . mirtazapine (REMERON) 15 MG tablet TAKE ONE TABLET (15MG  TOTAL) BY MOUTH ATBEDTIME 30 tablet 3  . nitroGLYCERIN (NITROSTAT) 0.4 MG SL tablet PLACE ONE TABLET UNDER THE TONGUE EVERY FIVE MINUTES AS NEEDED FOR CHEST PAIN 25 tablet 3  . ONETOUCH ULTRA test strip USE 1 STRIP TO CHECK GLUCOSE 4 TIMES DAILY    . pantoprazole (PROTONIX) 40 MG tablet TAKE ONE TABLET BY MOUTH TWICE A DAY 180 tablet 3  . Probiotic Product (ALIGN PO) Take 1 tablet by mouth daily.     . tamsulosin (FLOMAX) 0.4 MG CAPS capsule TAKE ONE CAPSULE BY MOUTH EVERY NIGHT AT BEDTIME 90 capsule 1  . vitamin B-12 (CYANOCOBALAMIN) 1000 MCG tablet Take 1,000 mcg by mouth daily. Reported on 08/25/2015     No facility-administered medications prior to visit.    Review of Systems  Review of Systems  Constitutional: Positive for fatigue.  HENT: Negative.   Respiratory: Positive for cough and shortness of breath. Negative for wheezing.   Cardiovascular: Negative.     Physical Exam  BP 134/60 (BP Location: Right Arm, Patient Position: Sitting, Cuff Size: Normal)   Pulse 72   Temp 98.3 F (36.8  C)   Ht 5\' 9"  (1.753 m)   Wt 221 lb (100.2 kg)   SpO2 95%   BMI 32.64 kg/m  Physical Exam Constitutional:      General: He is not in acute distress.    Appearance: Normal appearance.  Neck:     Musculoskeletal: Normal range of motion and neck supple.  Cardiovascular:     Rate and Rhythm: Normal rate and regular rhythm.  Pulmonary:     Breath sounds: No wheezing.     Comments: Mild dyspnea with talking  fine crackles left base Skin:    General: Skin is warm and dry.  Neurological:     General: No focal deficit present.     Mental Status: He is alert and oriented to person, place, and time. Mental status is at baseline.  Psychiatric:        Mood and Affect: Mood normal.        Behavior: Behavior normal.        Thought Content: Thought content normal.        Judgment: Judgment normal.      Lab Results:  CBC    Component Value Date/Time   WBC 5.0 10/03/2018 1011   RBC 4.69 10/03/2018 1011   HGB 14.1 10/03/2018 1011   HGB 13.3 08/25/2015 1032   HCT 41.3 10/03/2018 1011   HCT 38.8 08/25/2015 1032   PLT 118.0 (L) 10/03/2018 1011   PLT 153 08/25/2015 1032   MCV 88.2 10/03/2018 1011   MCV 83 08/25/2015 1032   MCH 28.5 07/14/2018 0438   MCHC 34.2 10/03/2018 1011   RDW 15.3 10/03/2018 1011   RDW 15.7 (H) 08/25/2015 1032   LYMPHSABS 1.8 10/03/2018 1011   LYMPHSABS 1.6 08/25/2015 1032   MONOABS 0.5 10/03/2018 1011   EOSABS 0.1 10/03/2018 1011   EOSABS 0.1 08/25/2015 1032   BASOSABS 0.0 10/03/2018 1011   BASOSABS 0.0 08/25/2015 1032    BMET    Component Value Date/Time   NA 137 10/03/2018 1011   NA 139 12/01/2016 1054   K 3.9 10/03/2018 1011   CL 103 10/03/2018 1011   CO2 25 10/03/2018 1011   GLUCOSE 279 (H) 10/03/2018 1011   BUN 21 10/03/2018 1011  BUN 10 12/01/2016 1054   CREATININE 1.10 10/03/2018 1011   CREATININE 1.00 11/21/2015 0753   CALCIUM 8.8 10/03/2018 1011   GFRNONAA >60 07/14/2018 0438   GFRAA >60 07/14/2018 0438    BNP    Component  Value Date/Time   BNP 42.5 07/07/2015 0201    ProBNP    Component Value Date/Time   PROBNP 38.0 10/03/2018 1011    Imaging: Dg Esophagus W Double Cm (hd)  Result Date: 11/27/2018 CLINICAL DATA:  Dysphagia. EXAM: ESOPHOGRAM / BARIUM SWALLOW / BARIUM TABLET STUDY TECHNIQUE: Combined double contrast and single contrast examination performed using effervescent crystals, thick barium liquid, and thin barium liquid. The patient was observed with fluoroscopy swallowing a 13 mm barium sulphate tablet. FLUOROSCOPY TIME:  Fluoroscopy Time:  1 minutes 24 seconds Radiation Exposure Index (if provided by the fluoroscopic device): 139 mGy Number of Acquired Spot Images: 11 COMPARISON:  None. FINDINGS: Laryngeal penetration without aspiration. No esophageal fold thickening, stricture or obstruction. Esophageal motility is normal. A 13 mm barium tablet passed into the stomach without difficulty. IMPRESSION: Limited penetration without aspiration.  Otherwise, normal study. Electronically Signed   By: Lorin Picket M.D.   On: 11/27/2018 09:19     Assessment & Plan:   Dyspnea on exertion - Continues to have moderate dyspnea on exertion with cough - PFTs FEV1 3.10 (99), ratio 81, no BD response, normal DLCO, TLC 73%, mild curvature on flow volume loop - No improvement with ICS/LABA - Trial LAMA such as Spiriva respimat 2.5 and prednisone taper - If no improvement recommend HRCT d/t symptoms and rales on exam to r/o ILD    Martyn Ehrich, NP 12/08/2018

## 2018-12-17 NOTE — Progress Notes (Signed)
Chart and office note reviewed in detail  > agree with a/p as outlined    

## 2018-12-27 DIAGNOSIS — H35373 Puckering of macula, bilateral: Secondary | ICD-10-CM | POA: Diagnosis not present

## 2018-12-27 DIAGNOSIS — H4312 Vitreous hemorrhage, left eye: Secondary | ICD-10-CM | POA: Diagnosis not present

## 2018-12-27 DIAGNOSIS — E113511 Type 2 diabetes mellitus with proliferative diabetic retinopathy with macular edema, right eye: Secondary | ICD-10-CM | POA: Diagnosis not present

## 2018-12-27 DIAGNOSIS — E113592 Type 2 diabetes mellitus with proliferative diabetic retinopathy without macular edema, left eye: Secondary | ICD-10-CM | POA: Diagnosis not present

## 2019-01-01 ENCOUNTER — Encounter: Payer: Self-pay | Admitting: Cardiology

## 2019-01-01 ENCOUNTER — Ambulatory Visit (INDEPENDENT_AMBULATORY_CARE_PROVIDER_SITE_OTHER): Payer: Medicare Other | Admitting: Cardiology

## 2019-01-01 ENCOUNTER — Other Ambulatory Visit: Payer: Self-pay

## 2019-01-01 VITALS — BP 134/70 | HR 71 | Ht 69.0 in | Wt 224.4 lb

## 2019-01-01 DIAGNOSIS — I2510 Atherosclerotic heart disease of native coronary artery without angina pectoris: Secondary | ICD-10-CM | POA: Diagnosis not present

## 2019-01-01 DIAGNOSIS — I10 Essential (primary) hypertension: Secondary | ICD-10-CM | POA: Diagnosis not present

## 2019-01-01 DIAGNOSIS — E782 Mixed hyperlipidemia: Secondary | ICD-10-CM | POA: Diagnosis not present

## 2019-01-01 DIAGNOSIS — I255 Ischemic cardiomyopathy: Secondary | ICD-10-CM | POA: Diagnosis not present

## 2019-01-01 NOTE — Patient Instructions (Signed)
Your physician wants you to follow-up in: [place] will receive a reminder letter in the mail two months in advance. If you don't receive a letter, please call our office to schedule the follow-up appointment.  Your physician recommends that you continue on your current medications as directed. Please refer to the Current Medication list given to you today.  HOLD METOPROLOL FOR 2 WEEKS AND CALL WITH AN UPDATE ON YOUR FATIGUE   Thank you for choosing [facility]!!
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
## 2019-01-03 ENCOUNTER — Other Ambulatory Visit: Payer: Self-pay | Admitting: Family Medicine

## 2019-01-03 NOTE — Telephone Encounter (Signed)
Follow-up virtual visit or in person visit for September may have 1 refill

## 2019-01-04 ENCOUNTER — Ambulatory Visit (INDEPENDENT_AMBULATORY_CARE_PROVIDER_SITE_OTHER): Payer: Medicare Other | Admitting: *Deleted

## 2019-01-04 DIAGNOSIS — I4901 Ventricular fibrillation: Secondary | ICD-10-CM | POA: Diagnosis not present

## 2019-01-04 NOTE — Telephone Encounter (Signed)
Phone visit scheduled 01/23/2019 - pt aware  Please order refill

## 2019-01-05 ENCOUNTER — Other Ambulatory Visit: Payer: Self-pay

## 2019-01-05 ENCOUNTER — Ambulatory Visit: Payer: Medicare Other | Admitting: Internal Medicine

## 2019-01-05 DIAGNOSIS — G894 Chronic pain syndrome: Secondary | ICD-10-CM | POA: Diagnosis not present

## 2019-01-05 DIAGNOSIS — M791 Myalgia, unspecified site: Secondary | ICD-10-CM | POA: Diagnosis not present

## 2019-01-05 DIAGNOSIS — M542 Cervicalgia: Secondary | ICD-10-CM | POA: Diagnosis not present

## 2019-01-05 LAB — CUP PACEART REMOTE DEVICE CHECK
Date Time Interrogation Session: 20200904062718
Implantable Lead Implant Date: 20050706
Implantable Lead Implant Date: 20161128
Implantable Lead Location: 753859
Implantable Lead Location: 753860
Implantable Lead Model: 7122
Implantable Pulse Generator Implant Date: 20130727
Pulse Gen Serial Number: 7041246

## 2019-01-09 ENCOUNTER — Ambulatory Visit: Payer: Medicare Other | Admitting: Internal Medicine

## 2019-01-09 ENCOUNTER — Ambulatory Visit (INDEPENDENT_AMBULATORY_CARE_PROVIDER_SITE_OTHER): Payer: Medicare Other | Admitting: Internal Medicine

## 2019-01-09 ENCOUNTER — Encounter: Payer: Self-pay | Admitting: Internal Medicine

## 2019-01-09 ENCOUNTER — Other Ambulatory Visit: Payer: Self-pay

## 2019-01-09 DIAGNOSIS — R0600 Dyspnea, unspecified: Secondary | ICD-10-CM

## 2019-01-09 DIAGNOSIS — I255 Ischemic cardiomyopathy: Secondary | ICD-10-CM | POA: Diagnosis not present

## 2019-01-09 DIAGNOSIS — R05 Cough: Secondary | ICD-10-CM

## 2019-01-09 DIAGNOSIS — R058 Other specified cough: Secondary | ICD-10-CM

## 2019-01-09 DIAGNOSIS — R0609 Other forms of dyspnea: Secondary | ICD-10-CM | POA: Diagnosis not present

## 2019-01-09 NOTE — Patient Instructions (Addendum)
Ideally protonix should be Take 30- 60 min before your first and last meals of the day   Stop spiriva   Keep the albuterol handy if needed for an attack of breathing   To get the most out of exercise, you need to be continuously aware that you are short of breath, but never out of breath, for 30 minutes daily. As you improve, it will actually be easier for you to do the same amount of exercise  in  30 minutes so always push to the level where you are short of breath.      If you are satisfied with your treatment plan,  let your doctor know and he/she can either refill your medications or you can return here when your prescription runs out.     If in any way you are not 100% satisfied,  please tell us.  If 100% better, tell your friends!  Pulmonary follow up is as needed

## 2019-01-09 NOTE — Progress Notes (Signed)
Lance [name], male    DOB: 1945/09/02, 73 y.o.   MRN: [id number]   Brief patient profile:  72 yowm quit smoking 2000 due to heart with nl pfts as of 03/21/14 referred to pulmonary clinic 10/03/2018 by Dr   Wolfgang Phoenix for cough x around 2013 (? p 2 neck surgeries)  never resolved (w/u by [facility] [id number] neg )  then doe 2015 and already had cards eval with last [place] 07/2018:  ostial LAD occluded, LCX occluded, RCA mid 75%. RPDA 90% chronic. LIMA-LAD patent, SVG-OM patent, SVG-diag patent,  SVG-RPDA occluded. The RCA was ballooned. LVEDP 14 so referred to pulmonary.  Note neg w/u for lung dz by [facility] in 2016 and no response to spiriva so d/c'd    History of Present Illness  10/03/2018  Pulmonary/ 1st office eval/Lance [name]  Chief Complaint  Patient presents with  . Shortness of Breath    Also feels like he is choking when eating. Will get tired very easily, has been seen by cardiology.   Dyspnea:  100 ft / slow = MMRC3 = can't walk 100 yards even at a slow pace at a flat grade s stopping due to sob  s variability Cough: dry hack 24/7 / globus sensations with dysphagia  / does not exac in am  Sleep: flat bed/ one pillow / some choking / smothering at hs / SABA use: no better and worse cough with  symbicort  so not using,  even neb does not help Uses allegra daily, not clearly helping "keep a mint in my mouth all the time" and seems to help throat "feel better" but not the cough rec Stop all mints and all inhalers Increase Protonix 40 mg Take 30- 60 min before your first and last meals of the day  GERD diet   10/23/2018  f/u ov/Lance [name] re: sob Chief Complaint  Patient presents with  . Follow-up    Breathing has not improved. He stopped using rescue inhaler and neb b/c they were not helping.   Dyspnea:  Straight line no trouble with doe, some problem with steps so = MMRC1 = can walk nl pace, flat grade, can't hurry or go uphills or steps s sob   Cough: globus sensation, not really coughing  Sleeping: 2-3  nights per weeks wakes up with choking / smothering and gets up and walks around and back to sleep ok SABA use: not helping  02: none  rec Ok stop all inhalers and nebulizers at this point as you don't feel they are helping We will be referring you back to Dr Mare Loan for your sensation of something stuck in your throat     Lance Napoleon NP 12/08/2018 with pfts nl x minimal concavity [facility]  rec  Trial Spiriva - take 2 puffs once daily (sample given) Albuterol rescue inahler- take 2 puffs every 6 hours as needed only for shortness of breath    01/09/2019  f/u ov/Lance [name] re: sob no better on spiriva  Chief Complaint  Patient presents with  . Follow-up    PFT's done 12/08/18.  Breathing is unchanged.    Dyspnea:  Tired more than sob, no change with lama or saba / really limited by back and legs  Cough: sensation of globus better with gerd per ENT per ENT  Sleeping: on side 10-15 degrees hob with bed blocks SABA use: still using bid s benefit  02: none    No obvious day to day or daytime variability or assoc excess/ purulent sputum or  mucus plugs or hemoptysis or cp or chest tightness, subjective wheeze or overt sinus or hb symptoms.   Sleeping as above without nocturnal  or early am exacerbation  of respiratory  c/o's or need for noct saba. Also denies any obvious fluctuation of symptoms with weather or environmental changes or other aggravating or alleviating factors except as outlined above   No unusual exposure hx or h/o childhood pna/ asthma or knowledge of premature birth.  Current Allergies, Complete Past Medical History, Past Surgical History, Family History, and Social History were reviewed in [employer] record.  ROS  The following are not active complaints unless bolded Hoarseness, sore throat, dysphagia, dental problems, itching, sneezing,  nasal congestion or discharge of excess mucus or purulent secretions, ear ache,   fever, chills, sweats, unintended wt loss or wt  gain, classically pleuritic or exertional cp,  orthopnea pnd or arm/hand swelling  or leg swelling, presyncope, palpitations, abdominal pain, anorexia, nausea, vomiting, diarrhea  or change in bowel habits or change in bladder habits, change in stools or change in urine, dysuria, hematuria,  rash, arthralgias, visual complaints, headache, numbness, weakness or ataxia or problems with walking or coordination,  change in mood or  memory.        Current Meds  Medication Sig  . albuterol (VENTOLIN HFA) 108 (90 Base) MCG/ACT inhaler Inhale 2 puffs into the lungs every 6 (six) hours as needed for wheezing or shortness of breath.  . ALPRAZolam (XANAX) 0.5 MG tablet Take 1 tablet (0.5 mg total) by mouth daily.  Marland Kitchen amLODipine (NORVASC) 2.5 MG tablet Take 1 tablet (2.5 mg total) by mouth daily.  Marland Kitchen aspirin EC 81 MG tablet Take 81 mg by mouth daily.  Marland Kitchen atorvastatin (LIPITOR) 80 MG tablet TAKE ONE (1) TABLET BY MOUTH EVERY DAY  . buPROPion (WELLBUTRIN SR) 150 MG 12 hr tablet TAKE ONE TABLET (150MG  TOTAL) BY MOUTH DAILY  . clopidogrel (PLAVIX) 75 MG tablet TAKE ONE (1) TABLET BY MOUTH EVERY DAY  . dextromethorphan-guaiFENesin (MUCINEX DM) 30-600 MG 12hr tablet Take 2 tablets by mouth 2 (two) times daily as needed for cough.  . donepezil (ARICEPT) 10 MG tablet TAKE ONE TABLET BY MOUTH EVERY NIGHT AT BEDTIME  . fexofenadine (ALLEGRA) 180 MG tablet Take 180 mg by mouth daily as needed for allergies.   Marland Kitchen HUMALOG KWIKPEN 100 UNIT/ML KiwkPen Inject 0.32-0.38 mLs (32-38 Units total) into the skin 3 (three) times daily. (Patient taking differently: Inject 30-36 Units 3 (three) times daily before meals into the skin. 70-199 = 30 units, 200-299 = 32 units, 300-399 = 34 units, >400 = 36 units)  . HYDROcodone-acetaminophen (LORTAB) 7.5-500 MG tablet Take 1 tablet by mouth every 6 (six) hours as needed for pain.  . Insulin Detemir (LEVEMIR FLEXTOUCH) 100 UNIT/ML Pen Inject 90 Units into the skin daily at 10 pm. (Patient taking  differently: Inject 90 Units at bedtime into the skin. )  . Insulin Pen Needle (B-D ULTRAFINE III SHORT PEN) 31G X 8 MM MISC USE 1 NEEDLE 4 TIMES DAILY AS DIRECTED  . JARDIANCE 25 MG TABS tablet Take 1 tablet daily by mouth.  . losartan (COZAAR) 50 MG tablet Take one half tablet by mouth daily.  . mirtazapine (REMERON) 15 MG tablet TAKE ONE TABLET (15MG  TOTAL) BY MOUTH ATBEDTIME  . nitroGLYCERIN (NITROSTAT) 0.4 MG SL tablet PLACE ONE TABLET UNDER THE TONGUE EVERY FIVE MINUTES AS NEEDED FOR CHEST PAIN  . ONETOUCH ULTRA test strip USE 1 STRIP TO CHECK  GLUCOSE 4 TIMES DAILY  . pantoprazole (PROTONIX) 40 MG tablet TAKE ONE TABLET BY MOUTH TWICE A DAY  . Probiotic Product (ALIGN PO) Take 1 tablet by mouth daily.   . tamsulosin (FLOMAX) 0.4 MG CAPS capsule TAKE ONE CAPSULE BY MOUTH EVERY NIGHT AT BEDTIME  . Tiotropium Bromide Monohydrate (SPIRIVA RESPIMAT) 2.5 MCG/ACT AERS Ifinished 2 weeks prior to OV    . vitamin B-12 (CYANOCOBALAMIN) 1000 MCG tablet Take 1,000 mcg by mouth daily. Reported on 08/25/2015             Past Medical History:  Diagnosis Date  . AICD (automatic cardioverter/defibrillator) present [address] [name] ICD, for SCD  . Allergic rhinitis, cause unspecified   . Anxiety   . Arthritis    "all over" (09/15/2016)  . ASCVD (arteriosclerotic cardiovascular disease)    70% mid left anterior descending lesion on cath in 06/1995; left anterior desending DES placed in 8/03 and RCA stent in 9/03; captain 3/05 revealed 90% second marginal for which PCI was performed, 70% PDA and a total obstruction of the first diagonal and marginal; sudden cardiac death in [place] in 11-05-03 for which automatic implantable cardiac defibrillator placed; negativ stress nuclear 10/07  . Atrial fibrillation ([place])   . Benign prostatic hypertrophy   . C. difficile diarrhea   . CAD (coronary artery disease) 1997  . CHF (congestive heart failure) ([place])   . Chronic lower back pain   . COPD (chronic  obstructive pulmonary disease) ([place])   . Coronary artery disease    a. s/p CABG in 2013 with LIMA-LAD, SVG-D1, SVG-OM, and SVG-PDA b. DES to SVG-PDA in 2016 c. DES to mid-RCA in 08/2016  . CVD (cerebrovascular disease) 05/2008   Transient ischemic attack; carotid ultrasound-plaque without focal disease  . Degenerative joint disease 2002   C-spine fusion   . Depression   . Diabetes mellitus without mention of complication   . Diabetic peripheral neuropathy ([place]) 09/06/2014  . Erectile dysfunction   . Esophageal reflux   . Headache    "monthly" (09/15/2016)  . HOH (hard of hearing)   . Hx-TIA (transient ischemic attack) 2010  . Hyperlipidemia   . Hypertension   . Memory deficits 09/05/2013  . Myocardial infarction ([place]) 1995  . Other testicular hypofunction   . Pacemaker   . S/P endoscopy Dec 2011   RMR: nl esophagus, hyperplastic polyp, active gastritis, no H.pylori.   . Shortness of breath   . Stroke [facility]) 2014   denies residual on 09/15/2016  . Tobacco abuse    100 pack/year comsuption; cigarettes discontinued 2003; all tobacco products in 2008  . Tubular adenoma   . Type II diabetes mellitus (HCC)    Insulin requirement  . Vitreous floaters of left eye       Objective:    01/09/2019          221   10/23/18 216 lb (98 kg)  10/03/18 220 lb (99.8 kg)  08/18/18 222 lb (100.7 kg)    Vital signs reviewed - Note on arrival 02 sats  98% on RA        Somber wm with minimal pseudowheeze better with plm   Wearing  upper and lower parital dentures,   HEENT : pt wearing mask not removed for exam due to covid - 19 concerns.    NECK :  without JVD/Nodes/TM/ nl carotid upstrokes bilaterally   LUNGS: no acc muscle use,  Nl contour chest which is clear to A and  P bilaterally without cough on insp or exp maneuvers   CV:  RRR  no s3 or murmur or increase in P2, and no edema   ABD:  Mod Obese nontender with nl inspiratory excursion in the supine position. No bruits or organomegaly  appreciated, bowel sounds nl  MS:  slt awkward  gait/ ext warm without deformities, calf tenderness, cyanosis or clubbing No obvious joint restrictions   SKIN: warm and dry without lesions    NEURO:  alert, approp, nl sensorium with  no motor or cerebellar deficits apparent.          Assessment

## 2019-01-10 ENCOUNTER — Encounter: Payer: Self-pay | Admitting: Internal Medicine

## 2019-01-10 NOTE — Assessment & Plan Note (Addendum)
Onset 2015 Echo 08/2013:  EF 45%, mod RV dysfxn, normal PA pressures, moderate LAE CXR 03/2014:  No acute process PFTs 2013:  FEV1 2.96 (88%), ratio 76, TLC 6.75 (99%), DLCO 28.59 (92%) PFTss 03/2014:  FEV1 2.82 (86%), ratio 83, TLC 5.39 (79%), DLCO 22.89 (73%) - 10/03/2018   Walked RA  2 laps @  approx 271ft each @ nl pace  stopped due to  End of study, no sob or cough or desats   10/23/2018 reported "as long as walk in straight line, no sob at all, just has it if turns head assoc with dizziness  - PFT's 12/08/2018  FEV1 3.10 (99), ratio 0.81, no BD response, normal DLCO, TLC 5.04 (73%), mild curvature on flow volume loop - Trial Spiriva respimat 2.5 >no change 01/09/2019 so d/c'd permanently   - 01/09/2019   Walked RA  2 laps @  approx 263ft each @ mod to fast pace  stopped due to  End of study, sob with sats still 99%  > pulmonary f/u prn  - presently off lopressor to see if helps fatigue but no better so next step is to stop statin to see if helps some of his aches though none is clearly muscle based on my eval today > still  seems like a reasonable short term trial though and encouraged him to f/u with cards as planned  No evidence of a pulmonary problem here > strongly rec reconditioning and best approach would be riding his stationery bike which bothers his back and legs the least   I had an extended discussion with the patient  reviewing all relevant studies completed to date and  lasting 15 to 20 minutes of a 25 minute final summary f/u office  visit  which included directly observing ambulatory 02 saturation study documented in a/p section of  today's  office note.  Each maintenance medication was reviewed in detail including most importantly the difference between maintenance and prns and under what circumstances the prns are to be triggered using an action plan format that is not reflected in the computer generated alphabetically organized AVS.     Please see AVS for specific instructions  unique to this visit that I personally wrote and verbalized to the the pt in detail and then reviewed with pt  by my nurse highlighting any changes in therapy recommended at today's visit .

## 2019-01-10 NOTE — Assessment & Plan Note (Signed)
?   Onset p second neck sugery ? Early 2000's with neg w/u By [facility] including MBS/DgEs -  CT Sinus (part of head ct) 06/28/18  wnl  - 10/03/2018 reported at pulmonary eval continuous use of daytime peppermint > rec d/c  - Allergy profile 10/03/2018 >  Eos 0.1/  IgE  104  RAST pos to everything but mold   ent eval by wolicki c/w gerd and improved on gerd rx > no change in recs, f/u ent prn with formal allergy eval another option if upper airway symptoms including nasal congestion persist.

## 2019-01-17 ENCOUNTER — Telehealth: Payer: Self-pay | Admitting: Cardiology

## 2019-01-17 NOTE — Telephone Encounter (Signed)
Will forward to Dr. Branch as an FYI: 

## 2019-01-17 NOTE — Telephone Encounter (Signed)
Patient calling to report that he is feeling better after starting new medication from [place]. Still feels sluggish but feeling better.  tg

## 2019-01-18 ENCOUNTER — Encounter: Payer: Self-pay | Admitting: Cardiology

## 2019-01-18 NOTE — Telephone Encounter (Signed)
Ok to stay off metoprolol for now, that is the medicine that we had held for possible fatigue   Zandra Abts MD

## 2019-01-18 NOTE — Progress Notes (Signed)
Remote ICD transmission.   

## 2019-01-23 ENCOUNTER — Ambulatory Visit (INDEPENDENT_AMBULATORY_CARE_PROVIDER_SITE_OTHER): Payer: Medicare Other | Admitting: Family Medicine

## 2019-01-23 ENCOUNTER — Other Ambulatory Visit: Payer: Self-pay

## 2019-01-23 DIAGNOSIS — R3912 Poor urinary stream: Secondary | ICD-10-CM | POA: Diagnosis not present

## 2019-01-23 DIAGNOSIS — I10 Essential (primary) hypertension: Secondary | ICD-10-CM | POA: Diagnosis not present

## 2019-01-23 DIAGNOSIS — N401 Enlarged prostate with lower urinary tract symptoms: Secondary | ICD-10-CM | POA: Diagnosis not present

## 2019-01-23 DIAGNOSIS — E1159 Type 2 diabetes mellitus with other circulatory complications: Secondary | ICD-10-CM | POA: Diagnosis not present

## 2019-01-23 NOTE — Progress Notes (Signed)
   Subjective:    Patient ID: Lance [name], male    DOB: 04/05/1946, 73 y.o.   MRN: [id number] Telephone only video not possible Hypertension This is a chronic problem. The current episode started more than 1 year ago. Pertinent negatives include no chest pain, headaches or shortness of breath. Risk factors for coronary artery disease include diabetes mellitus, dyslipidemia and male gender. Treatments tried: norvasc, lopressor. There are no compliance problems.     Patient having weak urinary stream difficulty urinating he would like to see urology.  He has had a PSA in the past couple years ago which was negative he denies any hematuria  Patient states under some stress because his wife's health is deteriorating but he is handling things well denies being depressed  He does have diabetes he sees a specialist on a regular basis but does not know when the last time he got a A1c  Review of Systems  Constitutional: Negative for diaphoresis and fatigue.  HENT: Negative for congestion and rhinorrhea.   Respiratory: Negative for cough and shortness of breath.   Cardiovascular: Negative for chest pain and leg swelling.  Gastrointestinal: Negative for abdominal pain and diarrhea.  Skin: Negative for color change and rash.  Neurological: Negative for dizziness and headaches.  Psychiatric/Behavioral: Negative for behavioral problems and confusion.     Patient having problems with his urine flow- having to strain to get pressures to get good flow- has seen urology in past for this problem but can't remember who  Virtual Visit via Video Note  I connected with Lance [name] on 01/23/19 at  8:30 AM EDT by a video enabled telemedicine application and verified that I am speaking with the correct person using two identifiers.  Location: Patient: home Provider: office   I discussed the limitations of evaluation and management by telemedicine and the availability of in person appointments. The  patient expressed understanding and agreed to proceed.  History of Present Illness:    Observations/Objective:   Assessment and Plan:   Follow Up Instructions:    I discussed the assessment and treatment plan with the patient. The patient was provided an opportunity to ask questions and all were answered. The patient agreed with the plan and demonstrated an understanding of the instructions.   The patient was advised to call back or seek an in-person evaluation if the symptoms worsen or if the condition fails to improve as anticipated.  I provided 17 minutes of non-face-to-face time during this encounter.        Objective:   Physical Exam  Today's visit was via telephone Physical exam was not possible for this visit       Assessment & Plan:  Diabetes-encourage patient to follow-up with specialist check A1c  Blood pressure good according to patient  BPH with weak urinary stream referral to urology check PSA

## 2019-01-23 NOTE — Telephone Encounter (Signed)
Pt made aware

## 2019-01-24 ENCOUNTER — Other Ambulatory Visit: Payer: Self-pay | Admitting: Family Medicine

## 2019-01-26 ENCOUNTER — Other Ambulatory Visit: Payer: Self-pay | Admitting: Family Medicine

## 2019-01-26 DIAGNOSIS — N401 Enlarged prostate with lower urinary tract symptoms: Secondary | ICD-10-CM | POA: Diagnosis not present

## 2019-01-26 DIAGNOSIS — E1159 Type 2 diabetes mellitus with other circulatory complications: Secondary | ICD-10-CM | POA: Diagnosis not present

## 2019-01-26 DIAGNOSIS — R3912 Poor urinary stream: Secondary | ICD-10-CM | POA: Diagnosis not present

## 2019-01-26 LAB — BASIC METABOLIC PANEL WITH GFR
BUN: 15 mg/dL (ref 7–25)
CO2: 28 mmol/L (ref 20–32)
Calcium: 9.1 mg/dL (ref 8.6–10.3)
Chloride: 107 mmol/L (ref 98–110)
Creat: 0.97 mg/dL (ref 0.70–1.18)
GFR, Est African American: 91 mL/min/{1.73_m2} (ref >=60)
GFR, Est Non African American: 78 mL/min/{1.73_m2} (ref >=60)
Glucose, Bld: 124 mg/dL — ABNORMAL HIGH (ref 65–99)
Potassium: 4.3 mmol/L (ref 3.5–5.3)
Sodium: 142 mmol/L (ref 135–146)

## 2019-01-26 LAB — HEMOGLOBIN A1C W/OUT EAG: Hgb A1c MFr Bld: 7.7 % of total Hgb — ABNORMAL HIGH (ref <5.7)

## 2019-01-26 LAB — PSA: PSA: 1 ng/mL (ref <=4.0)

## 2019-01-27 ENCOUNTER — Encounter: Payer: Self-pay | Admitting: Family Medicine

## 2019-02-05 ENCOUNTER — Other Ambulatory Visit: Payer: Self-pay | Admitting: Family Medicine

## 2019-02-06 ENCOUNTER — Encounter: Payer: Self-pay | Admitting: Family Medicine

## 2019-02-06 DIAGNOSIS — B351 Tinea unguium: Secondary | ICD-10-CM | POA: Diagnosis not present

## 2019-02-06 DIAGNOSIS — E1142 Type 2 diabetes mellitus with diabetic polyneuropathy: Secondary | ICD-10-CM | POA: Diagnosis not present

## 2019-02-06 NOTE — Telephone Encounter (Signed)
6 months each please

## 2019-02-08 ENCOUNTER — Telehealth: Payer: Self-pay | Admitting: Family Medicine

## 2019-02-08 NOTE — Telephone Encounter (Signed)
It is okay to take this but typical dosing is 400 IU 1 daily of vitamin D

## 2019-02-08 NOTE — Telephone Encounter (Signed)
Pt wants to know if it's ok for him to take Vitamin D3 - 85mcg - 1000mg   Please advise & call pt

## 2019-02-08 NOTE — Telephone Encounter (Signed)
Left message to return call 

## 2019-02-09 DIAGNOSIS — Z23 Encounter for immunization: Secondary | ICD-10-CM | POA: Diagnosis not present

## 2019-02-09 NOTE — Telephone Encounter (Signed)
Patient advised per Dr Nicki Reaper  it is ok to take Vit D but typical dosing is 400 IU 1 daily. Patient verbalized understanding.

## 2019-02-15 ENCOUNTER — Other Ambulatory Visit: Payer: Self-pay

## 2019-02-15 ENCOUNTER — Ambulatory Visit (INDEPENDENT_AMBULATORY_CARE_PROVIDER_SITE_OTHER): Payer: Medicare Other | Admitting: Family Medicine

## 2019-02-15 ENCOUNTER — Encounter: Payer: Self-pay | Admitting: Family Medicine

## 2019-02-15 VITALS — BP 132/76 | Temp 97.7°F | Wt 219.0 lb

## 2019-02-15 DIAGNOSIS — I255 Ischemic cardiomyopathy: Secondary | ICD-10-CM

## 2019-02-15 DIAGNOSIS — I959 Hypotension, unspecified: Secondary | ICD-10-CM | POA: Diagnosis not present

## 2019-02-15 DIAGNOSIS — R5383 Other fatigue: Secondary | ICD-10-CM | POA: Diagnosis not present

## 2019-02-15 NOTE — Progress Notes (Signed)
   Subjective:    Patient ID: Lance [name], male    DOB: Jul 20, 1945, 73 y.o.   MRN: [id number]  HPI Pt states he has been feeling really tired for a long time. Pt has seen lung doctor and heart doctor. Pt states he is sleeping ok and eating ok, not sure why he is so tired.  Patient with significant fatigue tiredness he denies having sleep apnea states he has been tested before in the past and it was negative he states his overall energy levelis poor he denies being depressed.  He states he is on his medicines.  Denies abusing his medicines.  Does not overtake his pain medicine.  We did discuss how medications can cause side effects and we went over all of his medicines today in addition to this we checked his blood pressure sitting and standing he denies high fever chills sweats denies loss of appetite denies abdominal pain chest discomforts denies myalgias.  Review of Systems  Constitutional: Negative for diaphoresis and fatigue.  HENT: Negative for congestion and rhinorrhea.   Respiratory: Negative for cough and shortness of breath.   Cardiovascular: Negative for chest pain and leg swelling.  Gastrointestinal: Negative for abdominal pain and diarrhea.  Skin: Negative for color change and rash.  Neurological: Negative for dizziness and headaches.  Psychiatric/Behavioral: Negative for behavioral problems and confusion.       Objective:   Physical Exam Vitals signs reviewed.  Constitutional:      General: He is not in acute distress. HENT:     Head: Normocephalic and atraumatic.  Eyes:     General:        Right eye: No discharge.        Left eye: No discharge.  Neck:     Trachea: No tracheal deviation.  Cardiovascular:     Rate and Rhythm: Normal rate and regular rhythm.     Heart sounds: Normal heart sounds. No murmur.  Pulmonary:     Effort: Pulmonary effort is normal. No respiratory distress.     Breath sounds: Normal breath sounds.  Lymphadenopathy:     Cervical: No  cervical adenopathy.  Skin:    General: Skin is warm and dry.  Neurological:     Mental Status: He is alert.     Coordination: Coordination normal.  Psychiatric:        Behavior: Behavior normal.           Assessment & Plan:  Significant fatigue and tiredness Relative hypotension Potentially poly medicine  I reduced his Remeron instead of 15 mg he would do one half each night  Stop amlodipine  Start taking co-Q10  Follow-up here in 4 weeks  Possibly will have to try a holiday off of statins depending on how he is doing when he follows up  Stop baclofen

## 2019-02-16 ENCOUNTER — Telehealth: Payer: Self-pay | Admitting: Family Medicine

## 2019-02-16 NOTE — Telephone Encounter (Signed)
Dr. Nicki Reaper recommended CO-Q 10 but when they got to [facility] there were 3 different strengths. 200, 400, 600.  Which one?

## 2019-02-16 NOTE — Telephone Encounter (Signed)
Please advise. Thank you

## 2019-02-22 ENCOUNTER — Encounter: Payer: Self-pay | Admitting: Internal Medicine

## 2019-02-22 ENCOUNTER — Ambulatory Visit (INDEPENDENT_AMBULATORY_CARE_PROVIDER_SITE_OTHER): Payer: Medicare Other | Admitting: Internal Medicine

## 2019-02-22 ENCOUNTER — Other Ambulatory Visit: Payer: Self-pay

## 2019-02-22 VITALS — BP 134/79 | HR 74 | Temp 97.6°F | Ht 69.0 in | Wt 220.0 lb

## 2019-02-22 DIAGNOSIS — I5022 Chronic systolic (congestive) heart failure: Secondary | ICD-10-CM

## 2019-02-22 DIAGNOSIS — Z95810 Presence of automatic (implantable) cardiac defibrillator: Secondary | ICD-10-CM | POA: Diagnosis not present

## 2019-02-22 DIAGNOSIS — I4901 Ventricular fibrillation: Secondary | ICD-10-CM | POA: Diagnosis not present

## 2019-02-22 DIAGNOSIS — I255 Ischemic cardiomyopathy: Secondary | ICD-10-CM

## 2019-02-22 LAB — CUP PACEART INCLINIC DEVICE CHECK
Battery Remaining Longevity: 33 mo
Brady Statistic RA Percent Paced: 0.12 %
Brady Statistic RV Percent Paced: 0.34 %
Date Time Interrogation Session: 20201022120456
HighPow Impedance: 63 Ohm
Implantable Lead Implant Date: 20050706
Implantable Lead Implant Date: 20161128
Implantable Lead Location: 753859
Implantable Lead Location: 753860
Implantable Lead Model: 7122
Implantable Pulse Generator Implant Date: 20130727
Lead Channel Impedance Value: 362.5 Ohm
Lead Channel Impedance Value: 412.5 Ohm
Lead Channel Pacing Threshold Amplitude: 0.75 V
Lead Channel Pacing Threshold Amplitude: 1 V
Lead Channel Pacing Threshold Pulse Width: 0.5 ms
Lead Channel Pacing Threshold Pulse Width: 0.5 ms
Lead Channel Sensing Intrinsic Amplitude: 11.3 mV
Lead Channel Sensing Intrinsic Amplitude: 3 mV
Lead Channel Setting Pacing Amplitude: 2 V
Lead Channel Setting Pacing Amplitude: 2.5 V
Lead Channel Setting Pacing Pulse Width: 0.5 ms
Lead Channel Setting Sensing Sensitivity: 0.5 mV
Pulse Gen Serial Number: 7041246

## 2019-02-22 MED ORDER — FUROSEMIDE 40 MG PO TABS: 40.0000 mg | ORAL_TABLET | Freq: Every day | ORAL | 3 refills | Status: DC

## 2019-02-22 NOTE — Patient Instructions (Signed)
Medication Instructions:  Your physician has recommended you make the following change in your medication:  Start Lasix 40 mg Daily   *If you need a refill on your cardiac medications before your next appointment, please call your pharmacy*  Lab Work: Your physician recommends that you return for lab work in: 2 Weeks   If you have labs (blood work) drawn today and your tests are completely normal, you will receive your results only by: Marland Kitchen MyChart Message (if you have MyChart) OR . A paper copy in the mail If you have any lab test that is abnormal or we need to change your treatment, we will call you to review the results.  Testing/Procedures: NONE   Follow-Up: At [facility], you and your health needs are our priority.  As part of our continuing mission to provide you with exceptional heart care, we have created designated Provider Care Teams.  These Care Teams include your primary Cardiologist (physician) and Advanced Practice Providers (APPs -  Physician Assistants and Nurse Practitioners) who all work together to provide you with the care you need, when you need it.  Your next appointment:   3 months  The format for your next appointment:   In Person  Provider:   Cristopher Peru, MD  Other Instructions Thank you for choosing [facility]!
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
## 2019-02-26 NOTE — Telephone Encounter (Signed)
I would recommend either 200 or 400 coq.10 daily

## 2019-02-27 NOTE — Telephone Encounter (Signed)
Patient notified and verbalized understanding. 

## 2019-03-05 DIAGNOSIS — I2510 Atherosclerotic heart disease of native coronary artery without angina pectoris: Secondary | ICD-10-CM | POA: Diagnosis not present

## 2019-03-05 DIAGNOSIS — Z794 Long term (current) use of insulin: Secondary | ICD-10-CM | POA: Diagnosis not present

## 2019-03-05 DIAGNOSIS — Z5181 Encounter for therapeutic drug level monitoring: Secondary | ICD-10-CM | POA: Diagnosis not present

## 2019-03-05 DIAGNOSIS — E1142 Type 2 diabetes mellitus with diabetic polyneuropathy: Secondary | ICD-10-CM | POA: Diagnosis not present

## 2019-03-09 ENCOUNTER — Other Ambulatory Visit (HOSPITAL_COMMUNITY)
Admission: RE | Admit: 2019-03-09 | Discharge: 2019-03-09 | Disposition: A | Discharge status: Home / Self Care | Payer: Medicare Other | Source: Ambulatory Visit | Attending: Internal Medicine | Admitting: Internal Medicine

## 2019-03-09 DIAGNOSIS — I5022 Chronic systolic (congestive) heart failure: Secondary | ICD-10-CM | POA: Diagnosis not present

## 2019-03-09 LAB — BASIC METABOLIC PANEL
Anion gap: 8 (ref 5–15)
BUN: 17 mg/dL (ref 8–23)
CO2: 25 mmol/L (ref 22–32)
Calcium: 9 mg/dL (ref 8.9–10.3)
Chloride: 107 mmol/L (ref 98–111)
Creatinine, Ser: 1.18 mg/dL (ref 0.61–1.24)
GFR calc Af Amer: >60 mL/min (ref >60)
GFR calc non Af Amer: >60 mL/min (ref >60)
Glucose, Bld: 109 mg/dL — ABNORMAL HIGH (ref 70–99)
Potassium: 3.8 mmol/L (ref 3.5–5.1)
Sodium: 140 mmol/L (ref 135–145)

## 2019-03-14 ENCOUNTER — Telehealth (HOSPITAL_COMMUNITY): Payer: Self-pay | Admitting: *Deleted

## 2019-03-14 NOTE — Telephone Encounter (Signed)
Left message requesting call back in regards to referral received from Dr. Harl Bowie and interest in participating in virtual cardiac rehab. Contact number provided. Will also send please contact letter. Cherre Huger, BSN Cardiac and Training and development officer

## 2019-03-15 ENCOUNTER — Ambulatory Visit (INDEPENDENT_AMBULATORY_CARE_PROVIDER_SITE_OTHER): Payer: Medicare Other | Admitting: Family Medicine

## 2019-03-15 ENCOUNTER — Other Ambulatory Visit: Payer: Self-pay

## 2019-03-15 ENCOUNTER — Encounter: Payer: Self-pay | Admitting: Family Medicine

## 2019-03-15 VITALS — BP 138/78 | Temp 97.6°F | Wt 217.8 lb

## 2019-03-15 DIAGNOSIS — I255 Ischemic cardiomyopathy: Secondary | ICD-10-CM

## 2019-03-15 DIAGNOSIS — I951 Orthostatic hypotension: Secondary | ICD-10-CM

## 2019-03-15 NOTE — Patient Instructions (Signed)
Stop amlodipine and recheck in 4 weeks

## 2019-03-15 NOTE — Progress Notes (Signed)
   Subjective:    Patient ID: Lance [name], male    DOB: [birth year], 73 y.o.   MRN: [id number]  HPI Pt here for 4 week follow up. Pt had visit on 02/15/2019 for fatigue. Pt states he is feeling the same. Pt states on Saturday when he would stand up he would get dizzy. BP sitting was 130/160s and upon standing it dropped to high 90s/60s. Pt had blood drew per Cardiolgy. Pt is still tired.   Intermittent dizzy spells more so when he stands up also feels washed out and fatigued tries to eat healthy for the most part.  He does find himself feeling depressed at times but not suicidal.  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  HENT: Negative for congestion and rhinorrhea.   Respiratory: Negative for cough and shortness of breath.   Cardiovascular: Negative for chest pain and leg swelling.  Gastrointestinal: Negative for abdominal pain, nausea and vomiting.  Neurological: Negative for dizziness and headaches.  Psychiatric/Behavioral: Negative for agitation and behavioral problems.       Objective:   Physical Exam Vitals signs reviewed.  Cardiovascular:     Rate and Rhythm: Normal rate and regular rhythm.     Heart sounds: Normal heart sounds. No murmur.  Pulmonary:     Effort: Pulmonary effort is normal.     Breath sounds: Normal breath sounds.  Lymphadenopathy:     Cervical: No cervical adenopathy.  Neurological:     Mental Status: He is alert.  Psychiatric:        Behavior: Behavior normal.     Blood pressure does drop significantly when he goes from sitting to standing      Assessment & Plan:  Postural hypotension We will go ahead with stopping amlodipine Follow-up in 4 weeks If progressive symptoms or worse to notify us

## 2019-04-03 ENCOUNTER — Telehealth (HOSPITAL_COMMUNITY): Payer: Self-pay

## 2019-04-03 NOTE — Telephone Encounter (Signed)
Pt did not respond to phone calls. Closed the cardiac rehab referral.

## 2019-04-05 ENCOUNTER — Ambulatory Visit (INDEPENDENT_AMBULATORY_CARE_PROVIDER_SITE_OTHER): Payer: Medicare Other | Admitting: *Deleted

## 2019-04-05 DIAGNOSIS — I255 Ischemic cardiomyopathy: Secondary | ICD-10-CM | POA: Diagnosis not present

## 2019-04-05 LAB — CUP PACEART REMOTE DEVICE CHECK
Battery Remaining Longevity: 30 mo
Battery Remaining Percentage: 30 %
Brady Statistic RA Percent Paced: 0 %
Brady Statistic RV Percent Paced: 0 %
Date Time Interrogation Session: 20201203070627
HighPow Impedance: 65 Ohm
Implantable Lead Implant Date: 20050706
Implantable Lead Implant Date: 20161128
Implantable Lead Location: 753859
Implantable Lead Location: 753860
Implantable Lead Model: 7122
Implantable Pulse Generator Implant Date: 20130727
Lead Channel Impedance Value: 390 Ohm
Lead Channel Impedance Value: 390 Ohm
Lead Channel Sensing Intrinsic Amplitude: 11.3 mV
Lead Channel Sensing Intrinsic Amplitude: 2.5 mV
Lead Channel Setting Pacing Amplitude: 2 V
Lead Channel Setting Pacing Amplitude: 2.5 V
Lead Channel Setting Pacing Pulse Width: 0.5 ms
Lead Channel Setting Sensing Sensitivity: 0.5 mV
Pulse Gen Serial Number: 7041246

## 2019-04-12 ENCOUNTER — Other Ambulatory Visit: Payer: Self-pay

## 2019-04-12 ENCOUNTER — Ambulatory Visit (INDEPENDENT_AMBULATORY_CARE_PROVIDER_SITE_OTHER): Payer: Medicare Other | Admitting: Family Medicine

## 2019-04-12 ENCOUNTER — Encounter: Payer: Self-pay | Admitting: Family Medicine

## 2019-04-12 VITALS — BP 118/70 | Temp 97.9°F | Ht 69.0 in | Wt 216.0 lb

## 2019-04-12 DIAGNOSIS — I951 Orthostatic hypotension: Secondary | ICD-10-CM | POA: Diagnosis not present

## 2019-04-12 DIAGNOSIS — E1159 Type 2 diabetes mellitus with other circulatory complications: Secondary | ICD-10-CM | POA: Diagnosis not present

## 2019-04-12 DIAGNOSIS — I255 Ischemic cardiomyopathy: Secondary | ICD-10-CM

## 2019-04-12 DIAGNOSIS — I6522 Occlusion and stenosis of left carotid artery: Secondary | ICD-10-CM

## 2019-04-12 MED ORDER — FUROSEMIDE 20 MG PO TABS: 20.0000 mg | ORAL_TABLET | ORAL | 1 refills | Status: DC

## 2019-04-12 NOTE — Patient Instructions (Signed)
Reduce furosemide Use 1/2 tablet each morning of the 40 mg  Losartan continue 1/2 tablet each morning  We will set up for carotid Doppler study Please follow-up in 1 month

## 2019-04-12 NOTE — Progress Notes (Signed)
   Subjective:    Patient ID: Lance [name], male    DOB: 11/27/1945, 73 y.o.   MRN: [id number]  HPIFollow up on blood pressure. Check bp sitting 118/70, lying 116/70.  Patient relates he has a lot of fatigue tiredness.  He also states he has ongoing dizzy spells worse when he stands up worse for the first few moments that he stands up denies any injuries such as falls.  States his overall appetite is doing well denies any fever chills sweats nausea vomiting denies chest tightness pressure pain PMH benign     Review of Systems  Constitutional: Negative for diaphoresis and fatigue.  HENT: Negative for congestion and rhinorrhea.   Respiratory: Negative for cough and shortness of breath.   Cardiovascular: Negative for chest pain and leg swelling.  Gastrointestinal: Negative for abdominal pain and diarrhea.  Skin: Negative for color change and rash.  Neurological: Positive for dizziness. Negative for headaches.  Psychiatric/Behavioral: Negative for behavioral problems and confusion.       Objective:   Physical Exam Vitals reviewed.  Constitutional:      General: He is not in acute distress. HENT:     Head: Normocephalic and atraumatic.  Eyes:     General:        Right eye: No discharge.        Left eye: No discharge.  Neck:     Trachea: No tracheal deviation.  Cardiovascular:     Rate and Rhythm: Normal rate and regular rhythm.     Heart sounds: Normal heart sounds. No murmur.  Pulmonary:     Effort: Pulmonary effort is normal. No respiratory distress.     Breath sounds: Normal breath sounds.  Lymphadenopathy:     Cervical: No cervical adenopathy.  Skin:    General: Skin is warm and dry.  Neurological:     Mental Status: He is alert.     Coordination: Coordination normal.  Psychiatric:        Behavior: Behavior normal.    Fall Risk  02/15/2019 07/06/2017 02/26/2016 11/25/2015 08/25/2015  Falls in the past year? 0 No Yes No No  Number falls in past yr: - - 2 or more - -   Injury with Fall? - - No - -  Risk for fall due to : - - Medication side effect - -  Follow up Falls evaluation completed - Falls evaluation completed - -          Assessment & Plan:  Postural hypotension Blood pressure 114/64 sitting 106/60 standing Patient utilizing 25 mg losartan Will also reduce furosemide from 40 mg down to 20 mg  Carotid artery disease with risk factors along with the dizziness I would recommend Doppler studies of the neck patient had mild build up several years ago  Diabetes under reasonable control  Recheck 1 month

## 2019-04-19 ENCOUNTER — Other Ambulatory Visit: Payer: Self-pay | Admitting: Cardiology

## 2019-05-01 NOTE — Progress Notes (Signed)
ICD remote 

## 2019-05-10 ENCOUNTER — Ambulatory Visit (INDEPENDENT_AMBULATORY_CARE_PROVIDER_SITE_OTHER): Payer: Medicare Other | Admitting: Family Medicine

## 2019-05-10 DIAGNOSIS — Z6832 Body mass index (BMI) 32.0-32.9, adult: Secondary | ICD-10-CM | POA: Diagnosis not present

## 2019-05-10 DIAGNOSIS — G894 Chronic pain syndrome: Secondary | ICD-10-CM | POA: Diagnosis not present

## 2019-05-10 DIAGNOSIS — I10 Essential (primary) hypertension: Secondary | ICD-10-CM | POA: Diagnosis not present

## 2019-05-10 DIAGNOSIS — J019 Acute sinusitis, unspecified: Secondary | ICD-10-CM | POA: Diagnosis not present

## 2019-05-10 DIAGNOSIS — M542 Cervicalgia: Secondary | ICD-10-CM | POA: Diagnosis not present

## 2019-05-10 DIAGNOSIS — M65331 Trigger finger, right middle finger: Secondary | ICD-10-CM | POA: Diagnosis not present

## 2019-05-10 DIAGNOSIS — Z79899 Other long term (current) drug therapy: Secondary | ICD-10-CM | POA: Diagnosis not present

## 2019-05-10 MED ORDER — DOXYCYCLINE HYCLATE 100 MG PO TABS: 100.0000 mg | ORAL_TABLET | Freq: Two times a day (BID) | ORAL | 0 refills | Status: DC

## 2019-05-10 NOTE — Progress Notes (Signed)
   Subjective:    Patient ID: Lance [name], male    DOB: 1945-08-09, 74 y.o.   MRN: [id number]  Sinus Problem This is a new problem. The current episode started in the past 7 days. Associated symptoms include congestion, coughing and sinus pressure. Pertinent negatives include no chills or ear pain.   Significant sinus congestion drainage coughing symptoms over the past week relates low energy.  Feels like he needs an antibiotic.  States he has been around some sickness.  No nausea vomiting diarrhea fever chills sweats   Review of Systems  Constitutional: Negative for activity change, chills and fever.  HENT: Positive for congestion, rhinorrhea and sinus pressure. Negative for ear pain.   Eyes: Negative for discharge.  Respiratory: Positive for cough. Negative for wheezing.   Cardiovascular: Negative for chest pain.  Gastrointestinal: Negative for nausea and vomiting.  Musculoskeletal: Negative for arthralgias.   Patient thinks he needs antibiotic- wife was recently sick with similar illness.  Virtual Visit via Video Note  I connected with Lance [name] on 05/10/19 at  4:10 PM EST by a video enabled telemedicine application and verified that I am speaking with the correct person using two identifiers.  Location: Patient: home Provider: office   I discussed the limitations of evaluation and management by telemedicine and the availability of in person appointments. The patient expressed understanding and agreed to proceed.  History of Present Illness:    Observations/Objective:   Assessment and Plan:   Follow Up Instructions:    I discussed the assessment and treatment plan with the patient. The patient was provided an opportunity to ask questions and all were answered. The patient agreed with the plan and demonstrated an understanding of the instructions.   The patient was advised to call back or seek an in-person evaluation if the symptoms worsen or if the condition  fails to improve as anticipated.  I provided 17 minutes of non-face-to-face time during this encounter.         Objective:   Physical Exam   Today's visit was via telephone Physical exam was not possible for this visit      Assessment & Plan:  Viral illness Secondary rhinosinusitis Recommend Covid test I did help the patient sign up for Covid test Antibiotic sent in.  Follow-up if progressive troubles.  Warning signs were discussed if he starts getting severely short of breath go to ER immediately

## 2019-05-11 ENCOUNTER — Ambulatory Visit: Payer: Medicare Other | Attending: Internal Medicine

## 2019-05-11 ENCOUNTER — Other Ambulatory Visit: Payer: Self-pay

## 2019-05-11 DIAGNOSIS — Z20822 Contact with and (suspected) exposure to covid-19: Secondary | ICD-10-CM | POA: Diagnosis not present

## 2019-05-12 LAB — NOVEL CORONAVIRUS, NAA: SARS-CoV-2, NAA: NOT DETECTED

## 2019-05-14 ENCOUNTER — Ambulatory Visit: Payer: Medicare Other | Admitting: Family Medicine

## 2019-05-16 ENCOUNTER — Ambulatory Visit (INDEPENDENT_AMBULATORY_CARE_PROVIDER_SITE_OTHER): Payer: Medicare Other | Admitting: Internal Medicine

## 2019-05-16 ENCOUNTER — Telehealth: Payer: Self-pay

## 2019-05-16 ENCOUNTER — Encounter: Payer: Self-pay | Admitting: Internal Medicine

## 2019-05-16 VITALS — BP 156/84 | HR 74 | Temp 97.8°F | Ht 69.0 in | Wt 214.0 lb

## 2019-05-16 DIAGNOSIS — I4901 Ventricular fibrillation: Secondary | ICD-10-CM

## 2019-05-16 DIAGNOSIS — I255 Ischemic cardiomyopathy: Secondary | ICD-10-CM

## 2019-05-16 LAB — CUP PACEART INCLINIC DEVICE CHECK
Battery Remaining Longevity: 31 mo
Brady Statistic RA Percent Paced: 0.06 %
Brady Statistic RV Percent Paced: 0.22 %
Date Time Interrogation Session: 20210113093507
HighPow Impedance: 64 Ohm
Implantable Lead Implant Date: 20050706
Implantable Lead Implant Date: 20161128
Implantable Lead Location: 753859
Implantable Lead Location: 753860
Implantable Lead Model: 7122
Implantable Pulse Generator Implant Date: 20130727
Lead Channel Impedance Value: 400 Ohm
Lead Channel Impedance Value: 412.5 Ohm
Lead Channel Pacing Threshold Amplitude: 0.75 V
Lead Channel Pacing Threshold Amplitude: 0.75 V
Lead Channel Pacing Threshold Amplitude: 1 V
Lead Channel Pacing Threshold Amplitude: 1 V
Lead Channel Pacing Threshold Pulse Width: 0.5 ms
Lead Channel Pacing Threshold Pulse Width: 0.5 ms
Lead Channel Pacing Threshold Pulse Width: 0.5 ms
Lead Channel Pacing Threshold Pulse Width: 0.5 ms
Lead Channel Sensing Intrinsic Amplitude: 11.3 mV
Lead Channel Sensing Intrinsic Amplitude: 3 mV
Lead Channel Setting Pacing Amplitude: 2 V
Lead Channel Setting Pacing Amplitude: 2.5 V
Lead Channel Setting Pacing Pulse Width: 0.5 ms
Lead Channel Setting Sensing Sensitivity: 0.5 mV
Pulse Gen Serial Number: 7041246
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
## 2019-05-16 NOTE — Patient Instructions (Signed)
.  Medication Instructions:  Your physician recommends that you continue on your current medications as directed. Please refer to the Current Medication list given to you today.  *If you need a refill on your cardiac medications before your next appointment, please call your pharmacy*  Lab Work: NONE If you have labs (blood work) drawn today and your tests are completely normal, you will receive your results only by: Marland Kitchen MyChart Message (if you have MyChart) OR . A paper copy in the mail If you have any lab test that is abnormal or we need to change your treatment, we will call you to review the results.  Testing/Procedures: NONE  Follow-Up: At [facility], you and your health needs are our priority.  As part of our continuing mission to provide you with exceptional heart care, we have created designated Provider Care Teams.  These Care Teams include your primary Cardiologist (physician) and Advanced Practice Providers (APPs -  Physician Assistants and Nurse Practitioners) who all work together to provide you with the care you need, when you need it.  Your next appointment:   12 month(s)  The format for your next appointment:   In Person  Provider:   Crissie Sickles, MD  Other Instructions NONE      Thank you for choosing [facility] !

## 2019-05-16 NOTE — Telephone Encounter (Signed)
Pt. Given results for COVID 19, verbalizes understanding.

## 2019-05-17 ENCOUNTER — Ambulatory Visit (HOSPITAL_COMMUNITY)
Admission: RE | Admit: 2019-05-17 | Discharge: 2019-05-17 | Disposition: A | Discharge status: Home / Self Care | Payer: Medicare Other | Source: Ambulatory Visit | Attending: Family Medicine | Admitting: Family Medicine

## 2019-05-17 ENCOUNTER — Other Ambulatory Visit: Payer: Self-pay

## 2019-05-17 DIAGNOSIS — I6522 Occlusion and stenosis of left carotid artery: Secondary | ICD-10-CM | POA: Diagnosis not present

## 2019-05-17 DIAGNOSIS — I6523 Occlusion and stenosis of bilateral carotid arteries: Secondary | ICD-10-CM | POA: Diagnosis not present

## 2019-05-21 ENCOUNTER — Ambulatory Visit (INDEPENDENT_AMBULATORY_CARE_PROVIDER_SITE_OTHER): Payer: Medicare Other | Admitting: Family Medicine

## 2019-05-21 ENCOUNTER — Other Ambulatory Visit: Payer: Self-pay

## 2019-05-21 DIAGNOSIS — I255 Ischemic cardiomyopathy: Secondary | ICD-10-CM

## 2019-05-21 DIAGNOSIS — J019 Acute sinusitis, unspecified: Secondary | ICD-10-CM | POA: Diagnosis not present

## 2019-05-21 MED ORDER — CEFDINIR 300 MG PO CAPS: 300.0000 mg | ORAL_CAPSULE | Freq: Two times a day (BID) | ORAL | 0 refills | Status: DC

## 2019-05-21 MED ORDER — PREDNISONE 20 MG PO TABS: ORAL_TABLET | ORAL | 0 refills | Status: DC

## 2019-05-21 NOTE — Progress Notes (Signed)
   Subjective:    Patient ID: Lance [name], male    DOB: 1945/08/04, 74 y.o.   MRN: [id number] Initially was a virtual patient brought to the office in order to be thoroughly examined Sinusitis This is a new problem. Associated symptoms include congestion, coughing, headaches and shortness of breath. Pertinent negatives include no chills or ear pain. Treatments tried: doxy. finished this morning.  pt states he had covid test last week and it was negative.  Significant head congestion drainage coughing denies severe shortness of breath but does relate some shortness of breath with moving around and some wheezing.  Has used the inhaler some without much success PMH benign Virtual Visit via Telephone Note  I connected with Lance [name] on 05/21/19 at 10:00 AM EST by telephone and verified that I am speaking with the correct person using two identifiers.  Location: Patient: home Provider: office   I discussed the limitations, risks, security and privacy concerns of performing an evaluation and management service by telephone and the availability of in person appointments. I also discussed with the patient that there may be a patient responsible charge related to this service. The patient expressed understanding and agreed to proceed.   History of Present Illness:    Observations/Objective:   Assessment and Plan:   Follow Up Instructions:    I discussed the assessment and treatment plan with the patient. The patient was provided an opportunity to ask questions and all were answered. The patient agreed with the plan and demonstrated an understanding of the instructions.   The patient was advised to call back or seek an in-person evaluation if the symptoms worsen or if the condition fails to improve as anticipated.  I provided 17 minutes of non-face-to-face time during this encounter.       Review of Systems  Constitutional: Negative for activity change, chills and fever.    HENT: Positive for congestion and rhinorrhea. Negative for ear pain.   Eyes: Negative for discharge.  Respiratory: Positive for cough, shortness of breath and wheezing.   Cardiovascular: Negative for chest pain.  Gastrointestinal: Negative for nausea and vomiting.  Musculoskeletal: Negative for arthralgias.  Neurological: Positive for headaches.       Objective:   Physical Exam Vitals and nursing note reviewed.  Constitutional:      Appearance: He is well-developed.  HENT:     Head: Normocephalic.     Mouth/Throat:     Pharynx: No oropharyngeal exudate.  Cardiovascular:     Rate and Rhythm: Normal rate and regular rhythm.     Heart sounds: Normal heart sounds. No murmur.  Pulmonary:     Effort: Pulmonary effort is normal.     Breath sounds: Normal breath sounds. No wheezing.  Musculoskeletal:     Cervical back: Normal range of motion.  Lymphadenopathy:     Cervical: No cervical adenopathy.  Skin:    General: Skin is warm and dry.  Neurological:     Motor: No abnormal muscle tone.           Assessment & Plan:  More than likely acute rhinosinusitis is been having this ongoing for several weeks we will go ahead with a round of antibiotics.  Patient was encouraged what warning signs to watch for.  I doubt he has Covid going on Should he have ongoing symptoms follow-up

## 2019-05-28 ENCOUNTER — Other Ambulatory Visit: Payer: Self-pay

## 2019-05-28 ENCOUNTER — Telehealth: Payer: Self-pay | Admitting: Family Medicine

## 2019-05-28 ENCOUNTER — Ambulatory Visit (INDEPENDENT_AMBULATORY_CARE_PROVIDER_SITE_OTHER): Payer: Medicare Other | Admitting: Family Medicine

## 2019-05-28 DIAGNOSIS — J019 Acute sinusitis, unspecified: Secondary | ICD-10-CM | POA: Diagnosis not present

## 2019-05-28 MED ORDER — AZITHROMYCIN 250 MG PO TABS: ORAL_TABLET | ORAL | 0 refills | Status: DC

## 2019-05-28 MED ORDER — ONDANSETRON HCL 8 MG PO TABS: 8.0000 mg | ORAL_TABLET | Freq: Three times a day (TID) | ORAL | 0 refills | Status: DC | PRN

## 2019-05-28 NOTE — Telephone Encounter (Signed)
Pt had virtual visit today and prescribed a Zpak. [place] is needing a diagnosis as to why he is using the Speed. Needs the diagnosis for COVID reasons. Please advise. Thank you

## 2019-05-28 NOTE — Progress Notes (Signed)
   Subjective:    Patient ID: Lance [name], male    DOB: [birth year], 74 y.o.   MRN: [id number]  Fever  This is a new problem. Episode onset: started Saturday. Associated symptoms include congestion, coughing and nausea. Pertinent negatives include no chest pain, ear pain, vomiting or wheezing. Associated symptoms comments: Weakness, SOB.   Patient relates intermittent fever although not severe over the past several days also relates some head congestion drainage coughing he just feels like he never got better from the previous illness at that time he did do a Covid test he denies any new muscle aches he does get out of breath with activity but he states this is not any significant more than what it has been.   Review of Systems  Constitutional: Positive for fever. Negative for activity change and chills.  HENT: Positive for congestion and rhinorrhea. Negative for ear pain.   Eyes: Negative for discharge.  Respiratory: Positive for cough. Negative for wheezing.   Cardiovascular: Negative for chest pain.  Gastrointestinal: Positive for nausea. Negative for vomiting.  Musculoskeletal: Negative for arthralgias.    Patient stated he was tested for covid 2 weeks ago and doesn't think it is that  Virtual Visit via Video Note  I connected with Lance [name] on 05/28/19 at 11:00 AM EST by a video enabled telemedicine application and verified that I am speaking with the correct person using two identifiers.  Location: Patient: home Provider: office   I discussed the limitations of evaluation and management by telemedicine and the availability of in person appointments. The patient expressed understanding and agreed to proceed.  History of Present Illness:    Observations/Objective:   Assessment and Plan:   Follow Up Instructions:    I discussed the assessment and treatment plan with the patient. The patient was provided an opportunity to ask questions and all were answered. The  patient agreed with the plan and demonstrated an understanding of the instructions.   The patient was advised to call back or seek an in-person evaluation if the symptoms worsen or if the condition fails to improve as anticipated.  I provided 16 minutes of non-face-to-face time during this encounter.  When specifically asked about fever he states his temperature he thinks was 96 degrees he really does not know the number I told him if it starts being 101 or more he needs to let us know      Objective:   Physical Exam  Today's visit was via telephone Physical exam was not possible for this visit       Assessment & Plan:  Sinusitis Antibiotic prescribed If he gets worse over the next few days I would recommend chest x-ray lab work and retesting of Covid Patient states he will keep [place] updated

## 2019-05-28 NOTE — Telephone Encounter (Signed)
I did talk with the pharmacy regarding this, sinus infection

## 2019-05-29 ENCOUNTER — Ambulatory Visit: Payer: Medicare Other | Admitting: Urology

## 2019-05-30 ENCOUNTER — Ambulatory Visit: Payer: Medicare Other | Admitting: Family Medicine

## 2019-06-06 ENCOUNTER — Other Ambulatory Visit: Payer: Self-pay | Admitting: Family Medicine

## 2019-06-06 ENCOUNTER — Ambulatory Visit: Payer: Medicare Other | Admitting: Neurology

## 2019-06-06 NOTE — Telephone Encounter (Signed)
Med check up 02/15/19

## 2019-06-06 NOTE — Telephone Encounter (Signed)
6 months on each 

## 2019-06-12 DIAGNOSIS — E1142 Type 2 diabetes mellitus with diabetic polyneuropathy: Secondary | ICD-10-CM | POA: Diagnosis not present

## 2019-06-12 DIAGNOSIS — B351 Tinea unguium: Secondary | ICD-10-CM | POA: Diagnosis not present

## 2019-06-13 DIAGNOSIS — E113593 Type 2 diabetes mellitus with proliferative diabetic retinopathy without macular edema, bilateral: Secondary | ICD-10-CM | POA: Diagnosis not present

## 2019-06-13 DIAGNOSIS — H35373 Puckering of macula, bilateral: Secondary | ICD-10-CM | POA: Diagnosis not present

## 2019-06-13 DIAGNOSIS — H4312 Vitreous hemorrhage, left eye: Secondary | ICD-10-CM | POA: Diagnosis not present

## 2019-06-20 ENCOUNTER — Other Ambulatory Visit: Payer: Self-pay | Admitting: Family Medicine

## 2019-07-01 ENCOUNTER — Ambulatory Visit: Payer: Medicare Other | Attending: Internal Medicine

## 2019-07-01 DIAGNOSIS — Z23 Encounter for immunization: Secondary | ICD-10-CM | POA: Insufficient documentation

## 2019-07-01 NOTE — Progress Notes (Signed)
   Covid-19 Vaccination Clinic  Name:  Lance [name]    MRN: [id number] DOB: 28-May-1945  07/01/2019  Lance [name] was observed post Covid-19 immunization for 15 minutes without incidence. He was provided with Vaccine Information Sheet and instruction to access the V-Safe system.   Lance [name] was instructed to call 911 with any severe reactions post vaccine: Marland Kitchen Difficulty breathing  . Swelling of your face and throat  . A fast heartbeat  . A bad rash all over your body  . Dizziness and weakness    Immunizations Administered    Name Date Dose VIS Date Route   Pfizer COVID-19 Vaccine 07/01/2019 11:51 AM 0.3 mL 04/13/2019 Intramuscular   Manufacturer: [place]   Lot: [id number]   [place]: [id number]

## 2019-07-05 ENCOUNTER — Ambulatory Visit (INDEPENDENT_AMBULATORY_CARE_PROVIDER_SITE_OTHER): Payer: Medicare Other | Admitting: *Deleted

## 2019-07-05 DIAGNOSIS — I255 Ischemic cardiomyopathy: Secondary | ICD-10-CM

## 2019-07-05 LAB — CUP PACEART REMOTE DEVICE CHECK
Battery Remaining Longevity: 28 mo
Battery Remaining Percentage: 28 %
Battery Voltage: 2.83 V
Brady Statistic AP VP Percent: 1 %
Brady Statistic AP VS Percent: 1 %
Brady Statistic AS VP Percent: 1 %
Brady Statistic AS VS Percent: 99 %
Brady Statistic RA Percent Paced: 1 %
Brady Statistic RV Percent Paced: 1 %
Date Time Interrogation Session: 20210304040016
HighPow Impedance: 63 Ohm
HighPow Impedance: 63 Ohm
Implantable Lead Implant Date: 20050706
Implantable Lead Implant Date: 20161128
Implantable Lead Location: 753859
Implantable Lead Location: 753860
Implantable Lead Model: 7122
Implantable Pulse Generator Implant Date: 20130727
Lead Channel Impedance Value: 400 Ohm
Lead Channel Impedance Value: 400 Ohm
Lead Channel Pacing Threshold Amplitude: 0.75 V
Lead Channel Pacing Threshold Amplitude: 1 V
Lead Channel Pacing Threshold Pulse Width: 0.5 ms
Lead Channel Pacing Threshold Pulse Width: 0.5 ms
Lead Channel Sensing Intrinsic Amplitude: 11.3 mV
Lead Channel Sensing Intrinsic Amplitude: 2.8 mV
Lead Channel Setting Pacing Amplitude: 2 V
Lead Channel Setting Pacing Amplitude: 2.5 V
Lead Channel Setting Pacing Pulse Width: 0.5 ms
Lead Channel Setting Sensing Sensitivity: 0.5 mV
Pulse Gen Serial Number: 7041246

## 2019-07-05 NOTE — Progress Notes (Signed)
ICD Remote  

## 2019-08-01 ENCOUNTER — Ambulatory Visit: Payer: Medicare Other | Attending: Internal Medicine

## 2019-08-01 DIAGNOSIS — Z23 Encounter for immunization: Secondary | ICD-10-CM

## 2019-08-01 NOTE — Progress Notes (Signed)
   Covid-19 Vaccination Clinic  Name:  Lance [name]    MRN: [id number] DOB: 1946-01-17  08/01/2019  [name] was observed post Covid-19 immunization for 15 minutes without incident. He was provided with Vaccine Information Sheet and instruction to access the V-Safe system.   [name] was instructed to call 911 with any severe reactions post vaccine: Marland Kitchen Difficulty breathing  . Swelling of face and throat  . A fast heartbeat  . A bad rash all over body  . Dizziness and weakness   Immunizations Administered    Name Date Dose VIS Date Route   Pfizer COVID-19 Vaccine 08/01/2019  9:38 AM 0.3 mL 04/13/2019 Intramuscular   Manufacturer: [place]   Lot: [id number]   [place]: [id number]

## 2019-08-06 DIAGNOSIS — Z5181 Encounter for therapeutic drug level monitoring: Secondary | ICD-10-CM | POA: Diagnosis not present

## 2019-08-06 DIAGNOSIS — E1142 Type 2 diabetes mellitus with diabetic polyneuropathy: Secondary | ICD-10-CM | POA: Diagnosis not present

## 2019-08-06 DIAGNOSIS — Z794 Long term (current) use of insulin: Secondary | ICD-10-CM | POA: Diagnosis not present

## 2019-08-06 DIAGNOSIS — I2510 Atherosclerotic heart disease of native coronary artery without angina pectoris: Secondary | ICD-10-CM | POA: Diagnosis not present

## 2019-08-08 DIAGNOSIS — G894 Chronic pain syndrome: Secondary | ICD-10-CM | POA: Diagnosis not present

## 2019-08-08 DIAGNOSIS — M542 Cervicalgia: Secondary | ICD-10-CM | POA: Diagnosis not present

## 2019-08-08 DIAGNOSIS — M791 Myalgia, unspecified site: Secondary | ICD-10-CM | POA: Diagnosis not present

## 2019-08-08 DIAGNOSIS — Z6832 Body mass index (BMI) 32.0-32.9, adult: Secondary | ICD-10-CM | POA: Diagnosis not present

## 2019-08-15 ENCOUNTER — Other Ambulatory Visit: Payer: Self-pay | Admitting: Gastroenterology

## 2019-08-16 NOTE — Telephone Encounter (Signed)
Patient needs ov for refills. Limited refills provided.

## 2019-08-17 ENCOUNTER — Encounter: Payer: Self-pay | Admitting: Internal Medicine

## 2019-08-17 ENCOUNTER — Other Ambulatory Visit: Payer: Self-pay | Admitting: Family Medicine

## 2019-08-17 NOTE — Telephone Encounter (Signed)
SENT LETTER STATING PATIENT NEEDED OFFICE VISIT

## 2019-08-19 NOTE — Telephone Encounter (Signed)
On the Plavix may have 90-day with 1 refill On the Xanax may have this +2 additional refills please pend

## 2019-08-27 ENCOUNTER — Other Ambulatory Visit: Payer: Self-pay

## 2019-08-27 ENCOUNTER — Telehealth: Payer: Self-pay | Admitting: *Deleted

## 2019-08-27 ENCOUNTER — Telehealth (INDEPENDENT_AMBULATORY_CARE_PROVIDER_SITE_OTHER): Payer: Medicare Other | Admitting: Family Medicine

## 2019-08-27 DIAGNOSIS — N3000 Acute cystitis without hematuria: Secondary | ICD-10-CM | POA: Diagnosis not present

## 2019-08-27 MED ORDER — NITROFURANTOIN MONOHYD MACRO 100 MG PO CAPS: 100.0000 mg | ORAL_CAPSULE | Freq: Two times a day (BID) | ORAL | 0 refills | Status: DC

## 2019-08-27 NOTE — Progress Notes (Signed)
Patient ID: Lance [name], male    DOB: Aug 01, 1945, 74 y.o.   MRN: [id number]   Chief Complaint  Patient presents with  . Dysuria    Virtual Visit via phone Note  I connected with Lance [name] on 08/27/19 at 11:00 AM EDT by a phone enabled telemedicine application and verified that I am speaking with the correct person using two identifiers.  Location: Patient: home Provider: office   I discussed the limitations of evaluation and management by telemedicine and the availability of in person appointments. The patient expressed understanding and agreed to proceed.  Subjective:   HPI  Patient with dysuria for several days. Patient using AZO and it is helping. Pt stating having pain and burning with urination over the last 2-3 days.  Not seeing any blood in the urine.  Has had some dark urine.  No odor or cloudiness.   No h/o recurrent UTIs.  No h/o BPH.  Pt stating over the last 2 days having a low grade temp.  Didn't check the temp.  No URI symptoms or concern of being near any contact with positive covid.  No n/v/d.    Medical History [name] has a past medical history of AICD (automatic cardioverter/defibrillator) present (2005), Allergic rhinitis, cause unspecified, Anxiety, Arthritis, ASCVD (arteriosclerotic cardiovascular disease), Atrial fibrillation ([place]), Benign prostatic hypertrophy, C. difficile diarrhea, CAD (coronary artery disease) (1997), CHF (congestive heart failure) ([place]), Chronic lower back pain, COPD (chronic obstructive pulmonary disease) ([place]), Coronary artery disease, CVD (cerebrovascular disease) (05/2008), Degenerative joint disease (2002), Depression, Diabetes mellitus without mention of complication, Diabetic peripheral neuropathy ([place]) (09/06/2014), Erectile dysfunction, Esophageal reflux, Headache, HOH (hard of hearing), TIA (transient ischemic attack) (2010), Hyperlipidemia, Hypertension, Memory deficits (09/05/2013), Myocardial infarction ([place]) (1995),  Other testicular hypofunction, Pacemaker, S/P endoscopy (Dec 2011), Shortness of breath, Stroke ([place]) (2014), Tobacco abuse, Tubular adenoma, Type II diabetes mellitus ([place]), and Vitreous floaters of left eye.   Outpatient Encounter Medications as of 08/27/2019  Medication Sig  . albuterol (VENTOLIN HFA) 108 (90 Base) MCG/ACT inhaler Inhale 2 puffs into the lungs every 6 (six) hours as needed for wheezing or shortness of breath.  . ALPRAZolam (XANAX) 0.5 MG tablet TAKE ONE TABLET TWICE DAILY  . aspirin EC 81 MG tablet Take 81 mg by mouth daily.  Marland Kitchen atorvastatin (LIPITOR) 80 MG tablet TAKE ONE (1) TABLET BY MOUTH EVERY DAY  . azithromycin (ZITHROMAX Z-PAK) 250 MG tablet Take 2 tablets (500 mg) on  Day 1,  followed by 1 tablet (250 mg) once daily on Days 2 through 5.  . buPROPion (WELLBUTRIN SR) 150 MG 12 hr tablet TAKE ONE TABLET (150MG  TOTAL) BY MOUTH DAILY  . cefdinir (OMNICEF) 300 MG capsule Take 1 capsule (300 mg total) by mouth 2 (two) times daily.  . clopidogrel (PLAVIX) 75 MG tablet TAKE ONE (1) TABLET BY MOUTH EVERY DAY  . dextromethorphan-guaiFENesin (MUCINEX DM) 30-600 MG 12hr tablet Take 2 tablets by mouth 2 (two) times daily as needed for cough.  . donepezil (ARICEPT) 10 MG tablet TAKE ONE TABLET BY MOUTH EVERY NIGHT AT BEDTIME  . fexofenadine (ALLEGRA) 180 MG tablet Take 180 mg by mouth daily as needed for allergies.   . furosemide (LASIX) 20 MG tablet Take 1 tablet (20 mg total) by mouth every morning.  Marland Kitchen HUMALOG KWIKPEN 100 UNIT/ML KiwkPen Inject 0.32-0.38 mLs (32-38 Units total) into the skin 3 (three) times daily. (Patient taking differently: Inject 30-36 Units 3 (three) times daily before meals into the skin.  70-199 = 30 units, 200-299 = 32 units, 300-399 = 34 units, >400 = 36 units)  . HYDROcodone-acetaminophen (LORTAB) 7.5-500 MG tablet Take 1 tablet by mouth every 6 (six) hours as needed for pain.  . Insulin Detemir (LEVEMIR FLEXTOUCH) 100 UNIT/ML Pen Inject 90 Units into the skin  daily at 10 pm. (Patient taking differently: Inject 90 Units at bedtime into the skin. )  . Insulin Pen Needle (B-D ULTRAFINE III SHORT PEN) 31G X 8 MM MISC USE 1 NEEDLE 4 TIMES DAILY AS DIRECTED  . JARDIANCE 25 MG TABS tablet Take 1 tablet daily by mouth.  . losartan (COZAAR) 50 MG tablet TAKE ONE (1) TABLET BY MOUTH EVERY DAY  . metoprolol tartrate (LOPRESSOR) 25 MG tablet TAKE ONE TABLET BY MOUTH TWICE A DAY  . mirtazapine (REMERON) 15 MG tablet TAKE 1 TABLET BY MOUTH AT BEDTIME  . nitrofurantoin, macrocrystal-monohydrate, (MACROBID) 100 MG capsule Take 1 capsule (100 mg total) by mouth 2 (two) times daily.  . nitroGLYCERIN (NITROSTAT) 0.4 MG SL tablet PLACE ONE TABLET UNDER THE TONGUE EVERY FIVE MINUTES AS NEEDED FOR CHEST PAIN  . ondansetron (ZOFRAN) 8 MG tablet Take 1 tablet (8 mg total) by mouth every 8 (eight) hours as needed for nausea.  Glory Rosebush ULTRA test strip USE 1 STRIP TO CHECK GLUCOSE 4 TIMES DAILY  . OVER THE COUNTER MEDICATION Vit d 3  . OVER THE COUNTER MEDICATION Coq10  . pantoprazole (PROTONIX) 40 MG tablet TAKE ONE TABLET BY MOUTH TWICE A DAY  . predniSONE (DELTASONE) 20 MG tablet 2 qd for 5d  . Probiotic Product (ALIGN PO) Take 1 tablet by mouth daily.   . tamsulosin (FLOMAX) 0.4 MG CAPS capsule TAKE ONE CAPSULE BY MOUTH EVERY NIGHT AT BEDTIME  . vitamin B-12 (CYANOCOBALAMIN) 1000 MCG tablet Take 1,000 mcg by mouth daily. Reported on 08/25/2015   No facility-administered encounter medications on file as of 08/27/2019.     Review of Systems  Constitutional: Negative for chills and fever.  HENT: Negative for congestion, rhinorrhea and sore throat.   Respiratory: Negative for cough, shortness of breath and wheezing.   Cardiovascular: Negative for chest pain and leg swelling.  Gastrointestinal: Negative for abdominal pain, diarrhea, nausea and vomiting.  Genitourinary: Positive for dysuria. Negative for difficulty urinating, discharge, flank pain, hematuria and urgency.   Skin: Negative for rash.  Neurological: Negative for dizziness, weakness and headaches.     Vitals There were no vitals taken for this visit.  Objective:   Physical Exam  No PE due to phone visit.  Assessment and Plan   1. Acute cystitis without hematuria - nitrofurantoin, macrocrystal-monohydrate, (MACROBID) 100 MG capsule; Take 1 capsule (100 mg total) by mouth 2 (two) times daily.  Dispense: 14 capsule; Refill: 0   -advising to take macrobid for 7 days.  If not improved to call or rto.  If worsneing with fever, abd or back pain or blood in urine.  Advising pt may need to come in and leave a urine for analysis and culture if the symptoms persist or return after antibiotics.    Pt in agreement. F/u prn.     Follow Up Instructions:    I discussed the assessment and treatment plan with the patient. The patient was provided an opportunity to ask questions and all were answered. The patient agreed with the plan and demonstrated an understanding of the instructions.   The patient was advised to call back or seek an in-person evaluation if the symptoms worsen  or if the condition fails to improve as anticipated.  I provided 11 minutes of non-face-to-face time during this encounter.

## 2019-08-27 NOTE — Telephone Encounter (Signed)
[name], Lance [name] are scheduled for a virtual visit with your provider today.    Just as we do with appointments in the office, we must obtain your consent to participate.  Your consent will be active for this visit and any virtual visit you may have with one of our providers in the next 365 days.    If you have a MyChart account, I can also send a copy of this consent to you electronically.  All virtual visits are billed to your insurance company just like a traditional visit in the office.  As this is a virtual visit, video technology does not allow for your provider to perform a traditional examination.  This may limit your provider's ability to fully assess your condition.  If your provider identifies any concerns that need to be evaluated in person or the need to arrange testing such as labs, EKG, etc, we will make arrangements to do so.    Although advances in technology are sophisticated, we cannot ensure that it will always work on either your end or our end.  If the connection with a video visit is poor, we may have to switch to a telephone visit.  With either a video or telephone visit, we are not always able to ensure that we have a secure connection.   I need to obtain your verbal consent now.   Are you willing to proceed with your visit today?   [name] has provided verbal consent on 08/27/2019 for a virtual visit (video or telephone).   Mitzie Na, RN 08/27/2019  9:52 AM

## 2019-08-28 DIAGNOSIS — E1142 Type 2 diabetes mellitus with diabetic polyneuropathy: Secondary | ICD-10-CM | POA: Diagnosis not present

## 2019-08-28 DIAGNOSIS — B351 Tinea unguium: Secondary | ICD-10-CM | POA: Diagnosis not present

## 2019-09-19 DIAGNOSIS — M791 Myalgia, unspecified site: Secondary | ICD-10-CM | POA: Diagnosis not present

## 2019-09-19 DIAGNOSIS — G894 Chronic pain syndrome: Secondary | ICD-10-CM | POA: Diagnosis not present

## 2019-09-19 DIAGNOSIS — M542 Cervicalgia: Secondary | ICD-10-CM | POA: Diagnosis not present

## 2019-09-25 ENCOUNTER — Telehealth (INDEPENDENT_AMBULATORY_CARE_PROVIDER_SITE_OTHER): Payer: Medicare Other | Admitting: Family Medicine

## 2019-09-25 ENCOUNTER — Other Ambulatory Visit: Payer: Self-pay

## 2019-09-25 DIAGNOSIS — Z87891 Personal history of nicotine dependence: Secondary | ICD-10-CM | POA: Diagnosis not present

## 2019-09-25 DIAGNOSIS — J0110 Acute frontal sinusitis, unspecified: Secondary | ICD-10-CM | POA: Diagnosis not present

## 2019-09-25 MED ORDER — AZITHROMYCIN 250 MG PO TABS: ORAL_TABLET | ORAL | 0 refills | Status: DC

## 2019-09-25 NOTE — Progress Notes (Signed)
Patient ID: Lance [name], male    DOB: [birth year], 74 y.o.   MRN: [id number]  Virtual Visit via Telephone Note  I connected with Lance [name] on 09/25/19 at  9:30 AM EDT by telephone and verified that I am speaking with the correct person using two identifiers.  Location: Patient: home Provider: office   I discussed the limitations, risks, security and privacy concerns of performing an evaluation and management service by telephone and the availability of in person appointments. I also discussed with the patient that there may be a patient responsible charge related to this service. The patient expressed understanding and agreed to proceed.    Chief Complaint  Patient presents with   Nasal Congestion    cough and sore throat for a few days   Subjective:    HPI Pt had phone visit for concern of coughing, nasal congestion, and soreness in neck.  Pt stating started 3-4 days ago.  Has had productive cough with yellow sputum.  Former smoker.  Using inhaler -not more than usual.   No wheezing or sob. Not taking otc meds. Has flonase at home. No sick contacts at home or know covid exposure.   Medical History Lance [name] has a past medical history of AICD (automatic cardioverter/defibrillator) present (2005), Allergic rhinitis, cause unspecified, Anxiety, Arthritis, ASCVD (arteriosclerotic cardiovascular disease), Atrial fibrillation ([place]), Benign prostatic hypertrophy, C. difficile diarrhea, CAD (coronary artery disease) (1997), CHF (congestive heart failure) ([place]), Chronic lower back pain, COPD (chronic obstructive pulmonary disease) ([place]), Coronary artery disease, CVD (cerebrovascular disease) (05/2008), Degenerative joint disease (2002), Depression, Diabetes mellitus without mention of complication, Diabetic peripheral neuropathy ([place]) (09/06/2014), Erectile dysfunction, Esophageal reflux, Headache, HOH (hard of hearing), TIA (transient ischemic attack) (2010), Hyperlipidemia,  Hypertension, Memory deficits (09/05/2013), Myocardial infarction ([place]) (1995), Other testicular hypofunction, Pacemaker, S/P endoscopy (Dec 2011), Shortness of breath, Stroke ([place]) (2014), Tobacco abuse, Tubular adenoma, Type II diabetes mellitus ([place]), and Vitreous floaters of left eye.   Outpatient Encounter Medications as of 09/25/2019  Medication Sig   albuterol (VENTOLIN HFA) 108 (90 Base) MCG/ACT inhaler Inhale 2 puffs into the lungs every 6 (six) hours as needed for wheezing or shortness of breath.   ALPRAZolam (XANAX) 0.5 MG tablet TAKE ONE TABLET TWICE DAILY   aspirin EC 81 MG tablet Take 81 mg by mouth daily.   atorvastatin (LIPITOR) 80 MG tablet TAKE ONE (1) TABLET BY MOUTH EVERY DAY   azithromycin (ZITHROMAX Z-PAK) 250 MG tablet Take 2 tab p.o. day 1, then take 1 tab daily for 4 more days.   buPROPion (WELLBUTRIN SR) 150 MG 12 hr tablet TAKE ONE TABLET (150MG  TOTAL) BY MOUTH DAILY   clopidogrel (PLAVIX) 75 MG tablet TAKE ONE (1) TABLET BY MOUTH EVERY DAY   dextromethorphan-guaiFENesin (MUCINEX DM) 30-600 MG 12hr tablet Take 2 tablets by mouth 2 (two) times daily as needed for cough.   donepezil (ARICEPT) 10 MG tablet TAKE ONE TABLET BY MOUTH EVERY NIGHT AT BEDTIME   fexofenadine (ALLEGRA) 180 MG tablet Take 180 mg by mouth daily as needed for allergies.    furosemide (LASIX) 20 MG tablet Take 1 tablet (20 mg total) by mouth every morning.   HUMALOG KWIKPEN 100 UNIT/ML KiwkPen Inject 0.32-0.38 mLs (32-38 Units total) into the skin 3 (three) times daily. (Patient taking differently: Inject 30-36 Units 3 (three) times daily before meals into the skin. 70-199 = 30 units, 200-299 = 32 units, 300-399 = 34 units, >400 = 36 units)   HYDROcodone-acetaminophen (LORTAB) 7.5-500  MG tablet Take 1 tablet by mouth every 6 (six) hours as needed for pain.   Insulin Detemir (LEVEMIR FLEXTOUCH) 100 UNIT/ML Pen Inject 90 Units into the skin daily at 10 pm. (Patient taking differently: Inject 90  Units at bedtime into the skin. )   Insulin Pen Needle (B-D ULTRAFINE III SHORT PEN) 31G X 8 MM MISC USE 1 NEEDLE 4 TIMES DAILY AS DIRECTED   JARDIANCE 25 MG TABS tablet Take 1 tablet daily by mouth.   losartan (COZAAR) 50 MG tablet TAKE ONE (1) TABLET BY MOUTH EVERY DAY   metoprolol tartrate (LOPRESSOR) 25 MG tablet TAKE ONE TABLET BY MOUTH TWICE A DAY   mirtazapine (REMERON) 15 MG tablet TAKE 1 TABLET BY MOUTH AT BEDTIME   nitroGLYCERIN (NITROSTAT) 0.4 MG SL tablet PLACE ONE TABLET UNDER THE TONGUE EVERY FIVE MINUTES AS NEEDED FOR CHEST PAIN   ondansetron (ZOFRAN) 8 MG tablet Take 1 tablet (8 mg total) by mouth every 8 (eight) hours as needed for nausea.   ONETOUCH ULTRA test strip USE 1 STRIP TO CHECK GLUCOSE 4 TIMES DAILY   OVER THE COUNTER MEDICATION Vit d 3   OVER THE COUNTER MEDICATION Coq10   pantoprazole (PROTONIX) 40 MG tablet TAKE ONE TABLET BY MOUTH TWICE A DAY   predniSONE (DELTASONE) 20 MG tablet 2 qd for 5d   Probiotic Product (ALIGN PO) Take 1 tablet by mouth daily.    tamsulosin (FLOMAX) 0.4 MG CAPS capsule TAKE ONE CAPSULE BY MOUTH EVERY NIGHT AT BEDTIME   vitamin B-12 (CYANOCOBALAMIN) 1000 MCG tablet Take 1,000 mcg by mouth daily. Reported on 08/25/2015   [DISCONTINUED] azithromycin (ZITHROMAX Z-PAK) 250 MG tablet Take 2 tablets (500 mg) on  Day 1,  followed by 1 tablet (250 mg) once daily on Days 2 through 5.   [DISCONTINUED] cefdinir (OMNICEF) 300 MG capsule Take 1 capsule (300 mg total) by mouth 2 (two) times daily.   [DISCONTINUED] nitrofurantoin, macrocrystal-monohydrate, (MACROBID) 100 MG capsule Take 1 capsule (100 mg total) by mouth 2 (two) times daily.   No facility-administered encounter medications on file as of 09/25/2019.     Review of Systems  Constitutional: Negative for chills and fever.  HENT: Positive for congestion and sore throat. Negative for ear pain, postnasal drip, rhinorrhea, sinus pressure, sinus pain and sneezing.   Eyes:  Negative for pain, discharge and itching.  Respiratory: Positive for cough. Negative for chest tightness, shortness of breath and wheezing.   Gastrointestinal: Negative for diarrhea, nausea and vomiting.  Skin: Negative for rash.  Neurological: Negative for headaches.     Vitals There were no vitals taken for this visit.  Objective:   Physical Exam  No PE due to phone visit.  Assessment and Plan   1. Acute non-recurrent frontal sinusitis - azithromycin (ZITHROMAX Z-PAK) 250 MG tablet; Take 2 tab p.o. day 1, then take 1 tab daily for 4 more days.  Dispense: 6 each; Refill: 0  2. Former smoker   Pt given watch and wait script. Pt having worsening coughing with productive sputum. H/o smoker and using albuterol prn. Advising flonase, mucinex, increase fluids, tylenol prn. Gave azithromycin.   Pt to f/u prn.     Follow Up Instructions:    I discussed the assessment and treatment plan with the patient. The patient was provided an opportunity to ask questions and all were answered. The patient agreed with the plan and demonstrated an understanding of the instructions.   The patient was advised to call back or  seek an in-person evaluation if the symptoms worsen or if the condition fails to improve as anticipated.  I provided 12 minutes of non-face-to-face time during this encounter.   [place], DO

## 2019-10-04 ENCOUNTER — Ambulatory Visit (INDEPENDENT_AMBULATORY_CARE_PROVIDER_SITE_OTHER): Payer: Medicare Other | Admitting: *Deleted

## 2019-10-04 DIAGNOSIS — I509 Heart failure, unspecified: Secondary | ICD-10-CM | POA: Diagnosis not present

## 2019-10-04 LAB — CUP PACEART REMOTE DEVICE CHECK
Battery Remaining Longevity: 26 mo
Battery Remaining Percentage: 26 %
Battery Voltage: 2.81 V
Brady Statistic AP VP Percent: 1 %
Brady Statistic AP VS Percent: 1 %
Brady Statistic AS VP Percent: 1 %
Brady Statistic AS VS Percent: 99 %
Brady Statistic RA Percent Paced: 1 %
Brady Statistic RV Percent Paced: 1 %
Date Time Interrogation Session: 20210603040016
HighPow Impedance: 62 Ohm
HighPow Impedance: 62 Ohm
Implantable Lead Implant Date: 20050706
Implantable Lead Implant Date: 20161128
Implantable Lead Location: 753859
Implantable Lead Location: 753860
Implantable Lead Model: 7122
Implantable Pulse Generator Implant Date: 20130727
Lead Channel Impedance Value: 360 Ohm
Lead Channel Impedance Value: 390 Ohm
Lead Channel Pacing Threshold Amplitude: 0.75 V
Lead Channel Pacing Threshold Amplitude: 1 V
Lead Channel Pacing Threshold Pulse Width: 0.5 ms
Lead Channel Pacing Threshold Pulse Width: 0.5 ms
Lead Channel Sensing Intrinsic Amplitude: 1.7 mV
Lead Channel Sensing Intrinsic Amplitude: 10 mV
Lead Channel Setting Pacing Amplitude: 2 V
Lead Channel Setting Pacing Amplitude: 2.5 V
Lead Channel Setting Pacing Pulse Width: 0.5 ms
Lead Channel Setting Sensing Sensitivity: 0.5 mV
Pulse Gen Serial Number: 7041246

## 2019-10-09 NOTE — Progress Notes (Signed)
Remote ICD transmission.   

## 2019-10-18 ENCOUNTER — Other Ambulatory Visit: Payer: Self-pay | Admitting: Cardiology

## 2019-10-26 ENCOUNTER — Ambulatory Visit (INDEPENDENT_AMBULATORY_CARE_PROVIDER_SITE_OTHER): Payer: Medicare Other | Admitting: Cardiology

## 2019-10-26 ENCOUNTER — Other Ambulatory Visit: Payer: Self-pay

## 2019-10-26 VITALS — BP 120/62 | HR 67 | Ht 69.0 in | Wt 215.0 lb

## 2019-10-26 DIAGNOSIS — I509 Heart failure, unspecified: Secondary | ICD-10-CM

## 2019-10-26 DIAGNOSIS — R0602 Shortness of breath: Secondary | ICD-10-CM | POA: Diagnosis not present

## 2019-10-26 DIAGNOSIS — I5022 Chronic systolic (congestive) heart failure: Secondary | ICD-10-CM

## 2019-10-26 DIAGNOSIS — I255 Ischemic cardiomyopathy: Secondary | ICD-10-CM

## 2019-10-26 MED ORDER — ROSUVASTATIN CALCIUM 40 MG PO TABS
40.0000 mg | ORAL_TABLET | Freq: Every day | ORAL | 3 refills | Status: DC
Start: 2019-10-26 — End: 2019-12-19

## 2019-10-26 NOTE — Patient Instructions (Addendum)
Medication Instructions:  Your physician has recommended you make the following change in your medication:  1.  STOP the Atorvastatin  2.  START Crestor 40 mg taking 1 tablet daily    *If you need a refill on your cardiac medications before your next appointment, please call your pharmacy*   Lab Work: None ordered  If you have labs (blood work) drawn today and your tests are completely normal, you will receive your results only by: Marland Kitchen MyChart Message (if you have MyChart) OR . A paper copy in the mail If you have any lab test that is abnormal or we need to change your treatment, we will call you to review the results.   Testing/Procedures: Your physician has requested that you have an echocardiogram. Echocardiography is a painless test that uses sound waves to create images of your heart. It provides your doctor with information about the size and shape of your heart and how well your heart's chambers and valves are working. This procedure takes approximately one hour. There are no restrictions for this procedure.    Follow-Up: At [facility], you and your health needs are our priority.  As part of our continuing mission to provide you with exceptional heart care, we have created designated Provider Care Teams.  These Care Teams include your primary Cardiologist (physician) and Advanced Practice Providers (APPs -  Physician Assistants and Nurse Practitioners) who all work together to provide you with the care you need, when you need it.  We recommend signing up for the patient portal called "MyChart".  Sign up information is provided on this After Visit Summary.  MyChart is used to connect with patients for Virtual Visits (Telemedicine).  Patients are able to view lab/test results, encounter notes, upcoming appointments, etc.  Non-urgent messages can be sent to your provider as well.   To learn more about what you can do with MyChart, go to [email or web address].    Your next  appointment:   6 month(s)  The format for your next appointment:   In Person  Provider:   Carlyle Dolly, MD   Other Instructions   Echocardiogram An echocardiogram is a procedure that uses painless sound waves (ultrasound) to produce an image of the heart. Images from an echocardiogram can provide important information about:  Signs of coronary artery disease (CAD).  Aneurysm detection. An aneurysm is a weak or damaged part of an artery wall that bulges out from the normal force of blood pumping through the body.  Heart size and shape. Changes in the size or shape of the heart can be associated with certain conditions, including heart failure, aneurysm, and CAD.  Heart muscle function.  Heart valve function.  Signs of a past heart attack.  Fluid buildup around the heart.  Thickening of the heart muscle.  A tumor or infectious growth around the heart valves. Tell a health care provider about:  Any allergies you have.  All medicines you are taking, including vitamins, herbs, eye drops, creams, and over-the-counter medicines.  Any blood disorders you have.  Any surgeries you have had.  Any medical conditions you have.  Whether you are pregnant or may be pregnant. What are the risks? Generally, this is a safe procedure. However, problems may occur, including:  Allergic reaction to dye (contrast) that may be used during the procedure. What happens before the procedure? No specific preparation is needed. You may eat and drink normally. What happens during the procedure?   An IV tube may be inserted  into one of your veins.  You may receive contrast through this tube. A contrast is an injection that improves the quality of the pictures from your heart.  A gel will be applied to your chest.  A wand-like tool (transducer) will be moved over your chest. The gel will help to transmit the sound waves from the transducer.  The sound waves will harmlessly bounce off  of your heart to allow the heart images to be captured in real-time motion. The images will be recorded on a computer. The procedure may vary among health care providers and hospitals. What happens after the procedure?  You may return to your normal, everyday life, including diet, activities, and medicines, unless your health care provider tells you not to do that. Summary  An echocardiogram is a procedure that uses painless sound waves (ultrasound) to produce an image of the heart.  Images from an echocardiogram can provide important information about the size and shape of your heart, heart muscle function, heart valve function, and fluid buildup around your heart.  You do not need to do anything to prepare before this procedure. You may eat and drink normally.  After the echocardiogram is completed, you may return to your normal, everyday life, unless your health care provider tells you not to do that. This information is not intended to replace advice given to you by your health care provider. Make sure you discuss any questions you have with your health care provider. Document Revised: 08/10/2018 Document Reviewed: 05/22/2016 Elsevier Patient Education  [place].
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
## 2019-11-05 ENCOUNTER — Ambulatory Visit (HOSPITAL_COMMUNITY)
Admission: RE | Admit: 2019-11-05 | Discharge: 2019-11-05 | Disposition: A | Discharge status: Home / Self Care | Payer: Medicare Other | Source: Ambulatory Visit | Attending: Cardiology | Admitting: Cardiology

## 2019-11-05 ENCOUNTER — Other Ambulatory Visit: Payer: Self-pay

## 2019-11-05 DIAGNOSIS — R0602 Shortness of breath: Secondary | ICD-10-CM | POA: Diagnosis not present

## 2019-11-05 NOTE — Progress Notes (Signed)
*  PRELIMINARY RESULTS* Echocardiogram 2D Echocardiogram has been performed.  Lance [name] 11/05/2019, 10:21 AM

## 2019-11-13 ENCOUNTER — Telehealth: Payer: Self-pay | Admitting: *Deleted

## 2019-11-13 NOTE — Telephone Encounter (Signed)
-----   Message from Arnoldo Lenis, MD sent at 11/13/2019 12:45 PM EDT ----- Echo looks good, overall heart function remains normal without significant change  Carlyle Dolly MD

## 2019-11-14 DIAGNOSIS — H35373 Puckering of macula, bilateral: Secondary | ICD-10-CM | POA: Diagnosis not present

## 2019-11-14 DIAGNOSIS — H4312 Vitreous hemorrhage, left eye: Secondary | ICD-10-CM | POA: Diagnosis not present

## 2019-11-14 DIAGNOSIS — H43391 Other vitreous opacities, right eye: Secondary | ICD-10-CM | POA: Diagnosis not present

## 2019-11-14 DIAGNOSIS — E113593 Type 2 diabetes mellitus with proliferative diabetic retinopathy without macular edema, bilateral: Secondary | ICD-10-CM | POA: Diagnosis not present

## 2019-11-14 NOTE — Telephone Encounter (Signed)
Laurine Blazer, LPN  1/54/0086 7:61 PM EDT Back to Top    Notified, copy to pcp.

## 2019-11-19 DIAGNOSIS — E113593 Type 2 diabetes mellitus with proliferative diabetic retinopathy without macular edema, bilateral: Secondary | ICD-10-CM | POA: Diagnosis not present

## 2019-11-19 DIAGNOSIS — H4313 Vitreous hemorrhage, bilateral: Secondary | ICD-10-CM | POA: Diagnosis not present

## 2019-11-19 DIAGNOSIS — H35371 Puckering of macula, right eye: Secondary | ICD-10-CM | POA: Diagnosis not present

## 2019-11-19 DIAGNOSIS — H43813 Vitreous degeneration, bilateral: Secondary | ICD-10-CM | POA: Diagnosis not present

## 2019-11-21 ENCOUNTER — Other Ambulatory Visit: Payer: Self-pay | Admitting: Gastroenterology

## 2019-11-21 ENCOUNTER — Other Ambulatory Visit: Payer: Self-pay | Admitting: Family Medicine

## 2019-11-21 NOTE — Telephone Encounter (Signed)
Patient needs office visit for further refills.  He was sent a letter in April 2021 stating the same and was provided limited refills at that time.

## 2019-11-22 NOTE — Telephone Encounter (Signed)
Lmom, waiting on a return call.  

## 2019-11-27 ENCOUNTER — Other Ambulatory Visit: Payer: Self-pay | Admitting: *Deleted

## 2019-11-27 DIAGNOSIS — E1159 Type 2 diabetes mellitus with other circulatory complications: Secondary | ICD-10-CM

## 2019-11-27 DIAGNOSIS — I959 Hypotension, unspecified: Secondary | ICD-10-CM

## 2019-11-27 DIAGNOSIS — Z125 Encounter for screening for malignant neoplasm of prostate: Secondary | ICD-10-CM

## 2019-11-27 DIAGNOSIS — I10 Essential (primary) hypertension: Secondary | ICD-10-CM

## 2019-11-27 DIAGNOSIS — R5383 Other fatigue: Secondary | ICD-10-CM

## 2019-12-04 ENCOUNTER — Encounter (HOSPITAL_COMMUNITY): Payer: Self-pay | Admitting: Emergency Medicine

## 2019-12-04 ENCOUNTER — Other Ambulatory Visit: Payer: Self-pay

## 2019-12-04 ENCOUNTER — Other Ambulatory Visit: Payer: Self-pay | Admitting: Family Medicine

## 2019-12-04 DIAGNOSIS — E113592 Type 2 diabetes mellitus with proliferative diabetic retinopathy without macular edema, left eye: Secondary | ICD-10-CM | POA: Insufficient documentation

## 2019-12-04 DIAGNOSIS — J45909 Unspecified asthma, uncomplicated: Secondary | ICD-10-CM | POA: Insufficient documentation

## 2019-12-04 DIAGNOSIS — I4891 Unspecified atrial fibrillation: Secondary | ICD-10-CM | POA: Insufficient documentation

## 2019-12-04 DIAGNOSIS — X58XXXA Exposure to other specified factors, initial encounter: Secondary | ICD-10-CM | POA: Diagnosis not present

## 2019-12-04 DIAGNOSIS — E1140 Type 2 diabetes mellitus with diabetic neuropathy, unspecified: Secondary | ICD-10-CM | POA: Diagnosis not present

## 2019-12-04 DIAGNOSIS — I110 Hypertensive heart disease with heart failure: Secondary | ICD-10-CM | POA: Insufficient documentation

## 2019-12-04 DIAGNOSIS — Y999 Unspecified external cause status: Secondary | ICD-10-CM | POA: Insufficient documentation

## 2019-12-04 DIAGNOSIS — Z79899 Other long term (current) drug therapy: Secondary | ICD-10-CM | POA: Diagnosis not present

## 2019-12-04 DIAGNOSIS — Y929 Unspecified place or not applicable: Secondary | ICD-10-CM | POA: Insufficient documentation

## 2019-12-04 DIAGNOSIS — J449 Chronic obstructive pulmonary disease, unspecified: Secondary | ICD-10-CM | POA: Diagnosis not present

## 2019-12-04 DIAGNOSIS — Z7982 Long term (current) use of aspirin: Secondary | ICD-10-CM | POA: Diagnosis not present

## 2019-12-04 DIAGNOSIS — H35371 Puckering of macula, right eye: Secondary | ICD-10-CM | POA: Diagnosis not present

## 2019-12-04 DIAGNOSIS — Z794 Long term (current) use of insulin: Secondary | ICD-10-CM | POA: Insufficient documentation

## 2019-12-04 DIAGNOSIS — I2510 Atherosclerotic heart disease of native coronary artery without angina pectoris: Secondary | ICD-10-CM | POA: Insufficient documentation

## 2019-12-04 DIAGNOSIS — E113593 Type 2 diabetes mellitus with proliferative diabetic retinopathy without macular edema, bilateral: Secondary | ICD-10-CM | POA: Diagnosis not present

## 2019-12-04 DIAGNOSIS — Z8673 Personal history of transient ischemic attack (TIA), and cerebral infarction without residual deficits: Secondary | ICD-10-CM | POA: Insufficient documentation

## 2019-12-04 DIAGNOSIS — I509 Heart failure, unspecified: Secondary | ICD-10-CM | POA: Insufficient documentation

## 2019-12-04 DIAGNOSIS — H4313 Vitreous hemorrhage, bilateral: Secondary | ICD-10-CM | POA: Diagnosis not present

## 2019-12-04 DIAGNOSIS — Z96651 Presence of right artificial knee joint: Secondary | ICD-10-CM | POA: Insufficient documentation

## 2019-12-04 DIAGNOSIS — S0502XA Injury of conjunctiva and corneal abrasion without foreign body, left eye, initial encounter: Secondary | ICD-10-CM | POA: Insufficient documentation

## 2019-12-04 DIAGNOSIS — Z951 Presence of aortocoronary bypass graft: Secondary | ICD-10-CM | POA: Insufficient documentation

## 2019-12-04 DIAGNOSIS — Y939 Activity, unspecified: Secondary | ICD-10-CM | POA: Insufficient documentation

## 2019-12-04 DIAGNOSIS — Z87891 Personal history of nicotine dependence: Secondary | ICD-10-CM | POA: Insufficient documentation

## 2019-12-04 DIAGNOSIS — H26493 Other secondary cataract, bilateral: Secondary | ICD-10-CM | POA: Diagnosis not present
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
## 2019-12-05 ENCOUNTER — Telehealth: Payer: Self-pay | Admitting: *Deleted

## 2019-12-05 ENCOUNTER — Emergency Department (HOSPITAL_COMMUNITY)
Admission: EM | Admit: 2019-12-05 | Discharge: 2019-12-05 | Disposition: A | Discharge status: Home / Self Care | Payer: Medicare Other | Attending: Emergency Medicine | Admitting: Emergency Medicine

## 2019-12-05 ENCOUNTER — Ambulatory Visit (INDEPENDENT_AMBULATORY_CARE_PROVIDER_SITE_OTHER): Payer: Medicare Other | Admitting: Family Medicine

## 2019-12-05 VITALS — Temp 97.7°F

## 2019-12-05 DIAGNOSIS — I255 Ischemic cardiomyopathy: Secondary | ICD-10-CM | POA: Diagnosis not present

## 2019-12-05 DIAGNOSIS — S0502XA Injury of conjunctiva and corneal abrasion without foreign body, left eye, initial encounter: Secondary | ICD-10-CM | POA: Diagnosis not present

## 2019-12-05 DIAGNOSIS — R05 Cough: Secondary | ICD-10-CM | POA: Diagnosis not present

## 2019-12-05 DIAGNOSIS — R059 Cough, unspecified: Secondary | ICD-10-CM

## 2019-12-05 MED ORDER — FLUORESCEIN SODIUM 1 MG OP STRP
1.0000 | ORAL_STRIP | Freq: Once | OPHTHALMIC | Status: AC
  Administered 2019-12-05: 1 via OPHTHALMIC

## 2019-12-05 MED ORDER — TETRACAINE HCL 0.5 % OP SOLN
1.0000 [drp] | Freq: Once | OPHTHALMIC | Status: AC
  Administered 2019-12-05: 1 [drp] via OPHTHALMIC

## 2019-12-05 MED ORDER — TOBRAMYCIN 0.3 % OP SOLN
2.0000 [drp] | Freq: Once | OPHTHALMIC | Status: DC
  Filled 2019-12-05: qty 5

## 2019-12-05 MED ORDER — TETRACAINE HCL 0.5 % OP SOLN: 1.0000 [drp] | Freq: Once | OPHTHALMIC | Status: DC

## 2019-12-05 NOTE — Telephone Encounter (Signed)
Patient and wife called and stated patient was in ER last night with pain in his left lung (ER note states dx corneal abrasion). Patient states he feels like he has pneumonia in that lung again but the ER doctor told him it was just muscular pain and didn't really check him out good and he wants to be rechecked

## 2019-12-05 NOTE — Telephone Encounter (Signed)
Got patient schedule for this afternoon at 4:30
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
## 2019-12-05 NOTE — Telephone Encounter (Signed)
Please connect with patient 425 pm outside (you may need to explain to family there will be others ahead of him but we will get to him)

## 2019-12-05 NOTE — Progress Notes (Signed)
   Subjective:    Patient ID: [name], male    DOB: 1945/05/07, 74 y.o.   MRN: [id number]  HPI pain on left side. Started 2 days ago. Sitting up to sleep in a chair because of issue with eye and not sure if he pulled a muscle sleeping in chair. Pain is worse with movement and when taking a deep breath.   History of COPD Coughing up clearish phlegm He states when he takes a deep breath or when he coughs he feels like he rattles in the low part of his chest Has a history of pneumonia he is worried about pneumonia Denies any significant shortness of breath with activity denies any chest tightness pressure pain Review of Systems Denies high fever chills sweats    Objective:   Physical Exam   Ears normal throat normal neck no masses lungs clear no crackles bronchial chest congestion noted Patient does not appear toxic    Assessment & Plan:  Probable environmental causing the congestion because this is been going on for a couple weeks Because of the patient's concerns as well as pulmonary findings on exam we will go ahead and do chest x-ray await the results of this no antibiotics indicated currently Warning signs were discussed

## 2019-12-05 NOTE — Discharge Instructions (Addendum)
Use Tobrex eyedrops, 2 drops every 4 hours while awake for the next 3 days.  Follow-up with your eye doctor if not improving in the next few days, and return to the ER symptoms significantly worsen or change.

## 2019-12-06 ENCOUNTER — Other Ambulatory Visit: Payer: Self-pay

## 2019-12-06 ENCOUNTER — Ambulatory Visit (HOSPITAL_COMMUNITY)
Admission: RE | Admit: 2019-12-06 | Discharge: 2019-12-06 | Disposition: A | Discharge status: Home / Self Care | Payer: Medicare Other | Source: Ambulatory Visit | Attending: Family Medicine | Admitting: Family Medicine

## 2019-12-06 ENCOUNTER — Telehealth: Payer: Self-pay | Admitting: *Deleted

## 2019-12-06 DIAGNOSIS — R079 Chest pain, unspecified: Secondary | ICD-10-CM | POA: Diagnosis not present

## 2019-12-06 DIAGNOSIS — R05 Cough: Secondary | ICD-10-CM | POA: Insufficient documentation

## 2019-12-06 NOTE — Telephone Encounter (Signed)
Stat cxr results in epic for review

## 2019-12-06 NOTE — Telephone Encounter (Signed)
Results were was called to the patient be a nurse earlier thank you

## 2019-12-10 ENCOUNTER — Telehealth: Payer: Self-pay | Admitting: *Deleted

## 2019-12-10 NOTE — Telephone Encounter (Signed)
   [facility] Pre-operative Risk Assessment    HEARTCARE STAFF: - Please ensure there is not already an duplicate clearance open for this procedure. - Under Visit Info/Reason for Call, type in Other and utilize the format Clearance MM/DD/YY or Clearance TBD. Do not use dashes or single digits. - If request is for dental extraction, please clarify the # of teeth to be extracted.  Request for surgical clearance:  1. What type of surgery is being performed? VITRECTOMY ENDOLASER, REMOVAL OF POSTERIOR CAPSULE OPACIFICATION AND INTRAVITREAL EYLEA    2. When is this surgery scheduled? 12/13/19   3. What type of clearance is required (medical clearance vs. Pharmacy clearance to hold med vs. Both)? MEDICAL  4. Are there any medications that need to be held prior to surgery and how long? PLAVIX and ASA   5. Practice name and name of physician performing surgery? [facility]; DR. Corene Cornea [place]   6. What is the office phone number? [phone]   7.   What is the office fax number? [phone]  8.   Anesthesia type (None, local, MAC, general) ? MAC   Julaine Hua 12/10/2019, 1:28 PM  _________________________________________________________________   (provider comments below)

## 2019-12-10 NOTE — Telephone Encounter (Signed)
ALSO DEVICE CLEARANCE FORM HAS BEEN SENT OVER AS WELL. I WILL RELAY DEVICE CLEARANCE FORM TO OUR DEVICE CLINIC.

## 2019-12-10 NOTE — Telephone Encounter (Signed)
Lance [name] 74 year old male last seen in the clinic on 11/25/2019.  He was stable at that time with his breathing and continued to have mild chest tightness/pinching under bilateral nipples.  Cardiac catheterization 3/20 showed occluded LAD, circumflex occluded, RCA 75%, RPDA 90% chronic.  LIMA-LAD patent, SVG-OM patent, SVG-diagonal patent, SVG RPDA occluded.  His RCA was ballooned.  May his aspirin Plavix be held for his upcoming procedure?  He is requesting clearance for vitrectomy endolaser, removal of posterior capsule opacification, and intravitreal Eylea.  His PMH includes coronary artery disease, ectatic aorta, hyperlipidemia, sudden cardiac death (ICD lead fracture that was repaired 6/17, TIA, tobacco abuse, COPD, and hypertension.  Please direct response to CV DIV preop for.  Thank you for your help.  Jossie Ng. Lance Wesche NP-C    12/10/2019, 2:49 PM [place] [address] Office [phone] Fax [phone]

## 2019-12-12 ENCOUNTER — Telehealth: Payer: Self-pay | Admitting: Cardiology

## 2019-12-12 DIAGNOSIS — I10 Essential (primary) hypertension: Secondary | ICD-10-CM | POA: Diagnosis not present

## 2019-12-12 DIAGNOSIS — R5383 Other fatigue: Secondary | ICD-10-CM | POA: Diagnosis not present

## 2019-12-12 DIAGNOSIS — E1159 Type 2 diabetes mellitus with other circulatory complications: Secondary | ICD-10-CM | POA: Diagnosis not present

## 2019-12-12 DIAGNOSIS — Z125 Encounter for screening for malignant neoplasm of prostate: Secondary | ICD-10-CM | POA: Diagnosis not present

## 2019-12-12 NOTE — Telephone Encounter (Signed)
Ok to hold plavix for procedure   J Novaleigh Kohlman MD 

## 2019-12-12 NOTE — Telephone Encounter (Signed)
New Message    Please call asap about surgical clearance they have already had to reschedule patient for retina surgery - he is now reschedule for Monday morning and they need to know if patient can hold his blood thinner.    Fax clearance too [phone]

## 2019-12-12 NOTE — Telephone Encounter (Signed)
This patient was cleared - see my note.  Kerin Ransom PA-C 12/12/2019 2:35 PM

## 2019-12-12 NOTE — Telephone Encounter (Signed)
   Primary Cardiologist: Carlyle Dolly, MD  Chart reviewed as part of pre-operative protocol coverage. Given past medical history and time since last visit, based on ACC/AHA guidelines, KADARRIUS YANKE would be at acceptable risk for the planned procedure without further cardiovascular testing.   Ok to hold Plavix 5-7 days pre op if needed. Generally we prefer the patient remain on aspirin but it can be held as well if needed. Resume both post op when safe.   I will route this recommendation to the requesting party via Epic fax function and remove from pre-op pool.  Please call with questions.  Kerin Ransom, PA-C 12/12/2019, 2:21 PM

## 2019-12-13 LAB — BASIC METABOLIC PANEL
BUN/Creatinine Ratio: 10 (ref 10–24)
BUN: 10 mg/dL (ref 8–27)
CO2: 22 mmol/L (ref 20–29)
Calcium: 9.3 mg/dL (ref 8.6–10.2)
Chloride: 100 mmol/L (ref 96–106)
Creatinine, Ser: 1 mg/dL (ref 0.76–1.27)
GFR calc Af Amer: 87 mL/min/{1.73_m2} (ref >59)
GFR calc non Af Amer: 75 mL/min/{1.73_m2} (ref >59)
Glucose: 148 mg/dL — ABNORMAL HIGH (ref 65–99)
Potassium: 4.4 mmol/L (ref 3.5–5.2)
Sodium: 138 mmol/L (ref 134–144)

## 2019-12-13 LAB — PSA: Prostate Specific Ag, Serum: 1.3 ng/mL (ref 0.0–4.0)

## 2019-12-13 LAB — HEMOGLOBIN A1C
Est. average glucose Bld gHb Est-mCnc: 209 mg/dL
Hgb A1c MFr Bld: 8.9 % — ABNORMAL HIGH (ref 4.8–5.6)

## 2019-12-17 DIAGNOSIS — H33311 Horseshoe tear of retina without detachment, right eye: Secondary | ICD-10-CM | POA: Diagnosis not present

## 2019-12-17 DIAGNOSIS — H4311 Vitreous hemorrhage, right eye: Secondary | ICD-10-CM | POA: Diagnosis not present

## 2019-12-17 DIAGNOSIS — H26491 Other secondary cataract, right eye: Secondary | ICD-10-CM | POA: Diagnosis not present

## 2019-12-17 DIAGNOSIS — E113591 Type 2 diabetes mellitus with proliferative diabetic retinopathy without macular edema, right eye: Secondary | ICD-10-CM | POA: Diagnosis not present

## 2019-12-18 DIAGNOSIS — E1142 Type 2 diabetes mellitus with diabetic polyneuropathy: Secondary | ICD-10-CM | POA: Diagnosis not present

## 2019-12-18 DIAGNOSIS — B351 Tinea unguium: Secondary | ICD-10-CM | POA: Diagnosis not present

## 2019-12-19 ENCOUNTER — Encounter: Payer: Self-pay | Admitting: Nurse Practitioner

## 2019-12-19 ENCOUNTER — Ambulatory Visit (INDEPENDENT_AMBULATORY_CARE_PROVIDER_SITE_OTHER): Payer: Medicare Other | Admitting: Nurse Practitioner

## 2019-12-19 ENCOUNTER — Other Ambulatory Visit: Payer: Self-pay | Admitting: Family Medicine

## 2019-12-19 ENCOUNTER — Other Ambulatory Visit: Payer: Self-pay

## 2019-12-19 VITALS — BP 125/69 | HR 64 | Temp 97.3°F | Ht 69.0 in | Wt 202.2 lb

## 2019-12-19 DIAGNOSIS — K219 Gastro-esophageal reflux disease without esophagitis: Secondary | ICD-10-CM

## 2019-12-19 DIAGNOSIS — R1310 Dysphagia, unspecified: Secondary | ICD-10-CM

## 2019-12-19 DIAGNOSIS — I255 Ischemic cardiomyopathy: Secondary | ICD-10-CM | POA: Diagnosis not present

## 2019-12-19 DIAGNOSIS — R1319 Other dysphagia: Secondary | ICD-10-CM

## 2019-12-19 MED ORDER — PANTOPRAZOLE SODIUM 40 MG PO TBEC: 40.0000 mg | DELAYED_RELEASE_TABLET | Freq: Two times a day (BID) | ORAL | 3 refills | Status: DC
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
## 2019-12-19 NOTE — Patient Instructions (Addendum)
Your health issues we discussed today were:   GERD (reflux/heartburn): 1. I sent a refill of Protonix 40 mg to your pharmacy. 2. Take this twice a day, pursing in the morning and 30 minutes for your last meal the day, as you have been 3. Call us for any worsening or unrelenting reflux symptoms despite Protonix  Overall I recommend:  1. Continue your other current medications 2. Return for follow-up 1 year 3. Call us if you have any questions or concerns   ---------------------------------------------------------------  I am glad you have gotten your COVID-19 vaccination!  Even though you are fully vaccinated you should continue to follow CDC and state/local guidelines.  ---------------------------------------------------------------   At [facility] we value your feedback. You may receive a survey about your visit today. Please share your experience as we strive to create trusting relationships with our patients to provide genuine, compassionate, quality care.  We appreciate your understanding and patience as we review any laboratory studies, imaging, and other diagnostic tests that are ordered as we care for you. Our office policy is 5 business days for review of these results, and any emergent or urgent results are addressed in a timely manner for your best interest. If you do not hear from our office in 1 week, please contact us.   We also encourage the use of MyChart, which contains your medical information for your review as well. If you are not enrolled in this feature, an access code is on this after visit summary for your convenience. Thank you for allowing [place] to be involved in your care.  It was great to see you today!  I hope you have a great rest of your summer!!

## 2019-12-20 ENCOUNTER — Ambulatory Visit (INDEPENDENT_AMBULATORY_CARE_PROVIDER_SITE_OTHER): Payer: Medicare Other | Admitting: Family Medicine

## 2019-12-20 ENCOUNTER — Encounter: Payer: Self-pay | Admitting: Family Medicine

## 2019-12-20 VITALS — BP 130/84 | Temp 96.9°F | Wt 202.0 lb

## 2019-12-20 DIAGNOSIS — I10 Essential (primary) hypertension: Secondary | ICD-10-CM | POA: Diagnosis not present

## 2019-12-20 DIAGNOSIS — E1159 Type 2 diabetes mellitus with other circulatory complications: Secondary | ICD-10-CM | POA: Diagnosis not present

## 2019-12-20 DIAGNOSIS — I255 Ischemic cardiomyopathy: Secondary | ICD-10-CM | POA: Diagnosis not present

## 2019-12-20 DIAGNOSIS — F324 Major depressive disorder, single episode, in partial remission: Secondary | ICD-10-CM | POA: Diagnosis not present

## 2019-12-20 DIAGNOSIS — R413 Other amnesia: Secondary | ICD-10-CM

## 2019-12-20 MED ORDER — MIRTAZAPINE 15 MG PO TABS: 15.0000 mg | ORAL_TABLET | Freq: Every day | ORAL | 5 refills | Status: DC

## 2019-12-20 MED ORDER — ALPRAZOLAM 0.5 MG PO TABS: 0.5000 mg | ORAL_TABLET | Freq: Two times a day (BID) | ORAL | 5 refills | Status: DC

## 2019-12-20 MED ORDER — LOSARTAN POTASSIUM 25 MG PO TABS: ORAL_TABLET | ORAL | 1 refills | Status: DC

## 2019-12-20 MED ORDER — BUPROPION HCL ER (SR) 150 MG PO TB12: ORAL_TABLET | ORAL | 1 refills | Status: DC

## 2019-12-20 MED ORDER — CLOPIDOGREL BISULFATE 75 MG PO TABS: ORAL_TABLET | ORAL | 1 refills | Status: DC

## 2019-12-20 MED ORDER — TAMSULOSIN HCL 0.4 MG PO CAPS: 0.4000 mg | ORAL_CAPSULE | Freq: Every day | ORAL | 1 refills | Status: DC

## 2019-12-20 NOTE — Progress Notes (Signed)
Subjective:    Patient ID: Lance [name], male    DOB: Apr 15, 1946, 74 y.o.   MRN: [id number]  HPI Pt here for medication check. Pt states no issues or concerns. Taking all meds as prescribed. Patient relates that he has been under a lot of stress lately at home.  Unfortunately he states some of the children in the house who are are grown adults will take things from him.  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  Denies being suicidal.  Unfortunately does not enjoy being at home but he is trying to cope as best he can  Review of Systems  Constitutional: Negative for diaphoresis and fatigue.  HENT: Negative for congestion and rhinorrhea.   Respiratory: Negative for cough and shortness of breath.   Cardiovascular: Negative for chest pain and leg swelling.  Gastrointestinal: Negative for abdominal pain and diarrhea.  Skin: Negative for color change and rash.  Neurological: Negative for dizziness and headaches.  Psychiatric/Behavioral: Negative for behavioral problems and confusion.       Objective:   Physical Exam Vitals reviewed.  Constitutional:      General: He is not in acute distress. HENT:     Head: Normocephalic and atraumatic.  Eyes:     General:        Right eye: No discharge.        Left eye: No discharge.  Neck:     Trachea: No tracheal deviation.  Cardiovascular:     Rate and Rhythm: Normal rate and regular rhythm.     Heart sounds: Normal heart sounds. No murmur heard.   Pulmonary:     Effort: Pulmonary effort is normal. No respiratory distress.     Breath sounds: Normal breath sounds.   Lymphadenopathy:     Cervical: No cervical adenopathy.  Skin:    General: Skin is warm and dry.  Neurological:     Mental Status: He is alert.     Coordination: Coordination normal.  Psychiatric:        Behavior: Behavior normal.           Assessment & Plan:  1. Essential hypertension Blood pressure good control continue current measures healthy eating recommended  2. Type 2 diabetes mellitus with vascular disease ([place]) Healthy eating recommended follow-up with endocrinology copy of lab work given to him to give to his endocrinologist  3. Depression, major, single episode, in partial remission ([place]) Continue antidepressants patient defers on counseling he will work hard at trying to cope with the current situation.  The more time he spends at the shop or with friends possibly the better follow-up sooner problems  4. Short-term memory loss Followed by neurology they have him on medication.  Stable currently   As for cardiology issues he is followed by cardiologist  Stress related issues he has been on Xanax for long span of time he requested to go up on the dose I told him we could not do that.  I believe the patient does benefit from having this available to him he has been advised  not to drive if feeling drowsy.  He has been advised to follow directions and not exceed 2 tablets in a day.  And on days that he feels better he has been advised to reduce it to 1

## 2019-12-26 DIAGNOSIS — E113593 Type 2 diabetes mellitus with proliferative diabetic retinopathy without macular edema, bilateral: Secondary | ICD-10-CM | POA: Diagnosis not present

## 2019-12-28 DIAGNOSIS — M791 Myalgia, unspecified site: Secondary | ICD-10-CM | POA: Diagnosis not present

## 2019-12-28 DIAGNOSIS — M542 Cervicalgia: Secondary | ICD-10-CM | POA: Diagnosis not present

## 2019-12-28 DIAGNOSIS — G894 Chronic pain syndrome: Secondary | ICD-10-CM | POA: Diagnosis not present

## 2020-01-03 ENCOUNTER — Ambulatory Visit (INDEPENDENT_AMBULATORY_CARE_PROVIDER_SITE_OTHER): Payer: Medicare Other | Admitting: *Deleted

## 2020-01-03 DIAGNOSIS — I509 Heart failure, unspecified: Secondary | ICD-10-CM

## 2020-01-04 LAB — CUP PACEART REMOTE DEVICE CHECK
Battery Remaining Longevity: 23 mo
Battery Remaining Percentage: 24 %
Battery Voltage: 2.8 V
Brady Statistic AP VP Percent: 1 %
Brady Statistic AP VS Percent: 1 %
Brady Statistic AS VP Percent: 1 %
Brady Statistic AS VS Percent: 99 %
Brady Statistic RA Percent Paced: 1 %
Brady Statistic RV Percent Paced: 1 %
Date Time Interrogation Session: 20210902040016
HighPow Impedance: 54 Ohm
HighPow Impedance: 54 Ohm
Implantable Lead Implant Date: 20050706
Implantable Lead Implant Date: 20161128
Implantable Lead Location: 753859
Implantable Lead Location: 753860
Implantable Lead Model: 7122
Implantable Pulse Generator Implant Date: 20130727
Lead Channel Impedance Value: 380 Ohm
Lead Channel Impedance Value: 380 Ohm
Lead Channel Pacing Threshold Amplitude: 0.75 V
Lead Channel Pacing Threshold Amplitude: 1 V
Lead Channel Pacing Threshold Pulse Width: 0.5 ms
Lead Channel Pacing Threshold Pulse Width: 0.5 ms
Lead Channel Sensing Intrinsic Amplitude: 11.3 mV
Lead Channel Sensing Intrinsic Amplitude: 2.1 mV
Lead Channel Setting Pacing Amplitude: 2 V
Lead Channel Setting Pacing Amplitude: 2.5 V
Lead Channel Setting Pacing Pulse Width: 0.5 ms
Lead Channel Setting Sensing Sensitivity: 0.5 mV
Pulse Gen Serial Number: 7041246

## 2020-01-08 NOTE — Progress Notes (Signed)
Remote ICD transmission.   

## 2020-01-10 ENCOUNTER — Other Ambulatory Visit: Payer: Self-pay | Admitting: Adult Health

## 2020-01-16 DIAGNOSIS — E113592 Type 2 diabetes mellitus with proliferative diabetic retinopathy without macular edema, left eye: Secondary | ICD-10-CM | POA: Diagnosis not present

## 2020-01-16 DIAGNOSIS — E113511 Type 2 diabetes mellitus with proliferative diabetic retinopathy with macular edema, right eye: Secondary | ICD-10-CM | POA: Diagnosis not present

## 2020-01-16 DIAGNOSIS — H4312 Vitreous hemorrhage, left eye: Secondary | ICD-10-CM | POA: Diagnosis not present

## 2020-01-17 ENCOUNTER — Telehealth: Payer: Self-pay | Admitting: *Deleted

## 2020-01-17 NOTE — Telephone Encounter (Signed)
   Primary Cardiologist: Carlyle Dolly, MD  Chart reviewed as part of pre-operative protocol coverage.   Left a voicemail for patient to call back for ongoing preoperative assessment.  Abigail Butts, PA-C 01/17/2020, 4:46 PM

## 2020-01-17 NOTE — Telephone Encounter (Signed)
   [facility] Pre-operative Risk Assessment    HEARTCARE STAFF: - Please ensure there is not already an duplicate clearance open for this procedure. - Under Visit Info/Reason for Call, type in Other and utilize the format Clearance MM/DD/YY or Clearance TBD. Do not use dashes or single digits. - If request is for dental extraction, please clarify the # of teeth to be extracted.  Request for surgical clearance:  1. What type of surgery is being performed?  VITRECTOMY ENDOLASER REMOVAL OF POSTERIOR CAPSULAR OPACIFICATION, INTRAURETERAL EYLEA INJECTION IN LEFT EYE  2. When is this surgery scheduled?  01/24/20   3. What type of clearance is required (medical clearance vs. Pharmacy clearance to hold med vs. Both)?  BOTH  4. Are there any medications that need to be held prior to surgery and how long? ASPIRIN & PLAVIX   5. Practice name and name of physician performing surgery?  DR. [facility]   6. What is the office phone number?     7.   What is the office fax number?  [phone]  8.   Anesthesia type (None, local, MAC, general) ?    Jeanann Lewandowsky 01/17/2020, 4:31 PM  _________________________________________________________________   (provider comments below)

## 2020-01-18 NOTE — Telephone Encounter (Signed)
LMTCB  And left message for JB

## 2020-01-18 NOTE — Telephone Encounter (Signed)
Dr. Harl Bowie can pt stop both ASA and Plavix for eye surgery?   Please reply to pre-op pool

## 2020-01-18 NOTE — Telephone Encounter (Signed)
Patient returned call
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
## 2020-01-24 DIAGNOSIS — E113512 Type 2 diabetes mellitus with proliferative diabetic retinopathy with macular edema, left eye: Secondary | ICD-10-CM | POA: Diagnosis not present

## 2020-01-24 DIAGNOSIS — H26492 Other secondary cataract, left eye: Secondary | ICD-10-CM | POA: Diagnosis not present

## 2020-01-24 DIAGNOSIS — H3581 Retinal edema: Secondary | ICD-10-CM | POA: Diagnosis not present

## 2020-01-24 DIAGNOSIS — H4312 Vitreous hemorrhage, left eye: Secondary | ICD-10-CM | POA: Diagnosis not present

## 2020-01-30 HISTORY — PX: EYE SURGERY: SHX253

## 2020-02-05 DIAGNOSIS — E113592 Type 2 diabetes mellitus with proliferative diabetic retinopathy without macular edema, left eye: Secondary | ICD-10-CM | POA: Diagnosis not present

## 2020-02-20 HISTORY — PX: EYE SURGERY: SHX253

## 2020-02-21 DIAGNOSIS — E1165 Type 2 diabetes mellitus with hyperglycemia: Secondary | ICD-10-CM | POA: Diagnosis not present

## 2020-02-21 DIAGNOSIS — I2510 Atherosclerotic heart disease of native coronary artery without angina pectoris: Secondary | ICD-10-CM | POA: Diagnosis not present

## 2020-02-21 DIAGNOSIS — Z794 Long term (current) use of insulin: Secondary | ICD-10-CM | POA: Diagnosis not present

## 2020-02-21 DIAGNOSIS — E1142 Type 2 diabetes mellitus with diabetic polyneuropathy: Secondary | ICD-10-CM | POA: Diagnosis not present

## 2020-02-26 DIAGNOSIS — E113593 Type 2 diabetes mellitus with proliferative diabetic retinopathy without macular edema, bilateral: Secondary | ICD-10-CM | POA: Diagnosis not present

## 2020-02-27 DIAGNOSIS — M542 Cervicalgia: Secondary | ICD-10-CM | POA: Diagnosis not present

## 2020-02-27 DIAGNOSIS — M791 Myalgia, unspecified site: Secondary | ICD-10-CM | POA: Diagnosis not present

## 2020-02-27 DIAGNOSIS — Z6833 Body mass index (BMI) 33.0-33.9, adult: Secondary | ICD-10-CM | POA: Diagnosis not present

## 2020-02-27 DIAGNOSIS — G894 Chronic pain syndrome: Secondary | ICD-10-CM | POA: Diagnosis not present

## 2020-03-12 ENCOUNTER — Other Ambulatory Visit: Payer: Self-pay

## 2020-03-12 ENCOUNTER — Ambulatory Visit (INDEPENDENT_AMBULATORY_CARE_PROVIDER_SITE_OTHER): Payer: Medicare Other | Admitting: Nurse Practitioner

## 2020-03-12 ENCOUNTER — Encounter: Payer: Self-pay | Admitting: Nurse Practitioner

## 2020-03-12 VITALS — BP 116/75 | HR 71 | Temp 97.0°F | Ht 69.0 in | Wt 212.8 lb

## 2020-03-12 DIAGNOSIS — R143 Flatulence: Secondary | ICD-10-CM | POA: Diagnosis not present

## 2020-03-12 DIAGNOSIS — I255 Ischemic cardiomyopathy: Secondary | ICD-10-CM | POA: Diagnosis not present

## 2020-03-12 DIAGNOSIS — K219 Gastro-esophageal reflux disease without esophagitis: Secondary | ICD-10-CM

## 2020-03-12 DIAGNOSIS — Z23 Encounter for immunization: Secondary | ICD-10-CM | POA: Diagnosis not present

## 2020-03-12 DIAGNOSIS — R197 Diarrhea, unspecified: Secondary | ICD-10-CM

## 2020-03-12 NOTE — Progress Notes (Signed)
Cc'ed to pcp °
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
## 2020-03-12 NOTE — Addendum Note (Signed)
Addended by: Gordy Levan, Cathlin Buchan A on: 03/12/2020 10:17 AM   Modules accepted: Orders

## 2020-03-12 NOTE — Patient Instructions (Signed)
Your health issues we discussed today were:   GERD (reflux/heartburn): 1. Continue taking Protonix as you have been 2. Let us know if you have any worsening symptoms 3. Call your pharmacy if you need a refill  Soft/loose stools and worsening gas: 1. As we discussed, you may pick up a "stomach bug" that caused your stools to change 2. Have your stool studies completed as soon as you can 3. After you collect your stool studies, start taking a probiotic (the good bacteria that belong in your colon). We've had good success with Align, Restora, Culturelle, [employer], and [employer]; however there are many good options and you can discuss with the pharmacist if you would like further guidance.  I have been told that [employer] has recently released a store brand version of "Align" that is significantly cheaper.  This may be a good option to try. 4. If you do not have a colon infection we will give you recommendations for medications to help you control your loose stools 5. Call us for any worsening or severe symptoms especially bleeding  Overall I recommend:  1. Continue your other current medications 2. Return for follow-up in 2 months 3. Call us for any questions or concerns   At [facility] we value your feedback. You may receive a survey about your visit today. Please share your experience as we strive to create trusting relationships with our patients to provide genuine, compassionate, quality care.  We appreciate your understanding and patience as we review any laboratory studies, imaging, and other diagnostic tests that are ordered as we care for you. Our office policy is 5 business days for review of these results, and any emergent or urgent results are addressed in a timely manner for your best interest. If you do not hear from our office in 1 week, please contact us.   We also encourage the use of MyChart, which contains your medical information for your review as  well. If you are not enrolled in this feature, an access code is on this after visit summary for your convenience. Thank you for allowing [place] to be involved in your care.  It was great to see you today!  I hope you have a Happy Thanksgiving!!

## 2020-03-13 DIAGNOSIS — R197 Diarrhea, unspecified: Secondary | ICD-10-CM | POA: Diagnosis not present

## 2020-03-13 DIAGNOSIS — R143 Flatulence: Secondary | ICD-10-CM | POA: Diagnosis not present

## 2020-03-13 DIAGNOSIS — K219 Gastro-esophageal reflux disease without esophagitis: Secondary | ICD-10-CM | POA: Diagnosis not present

## 2020-03-14 LAB — GASTROINTESTINAL PATHOGEN PANEL PCR
C. difficile Tox A/B, PCR: NOT DETECTED
Campylobacter, PCR: NOT DETECTED
Cryptosporidium, PCR: NOT DETECTED
E coli (ETEC) LT/ST PCR: NOT DETECTED
E coli (STEC) stx1/stx2, PCR: NOT DETECTED
E coli 0157, PCR: NOT DETECTED
Giardia lamblia, PCR: NOT DETECTED
Norovirus, PCR: NOT DETECTED
Rotavirus A, PCR: NOT DETECTED
Salmonella, PCR: NOT DETECTED
Shigella, PCR: NOT DETECTED

## 2020-03-14 LAB — CLOSTRIDIUM DIFFICILE TOXIN B, QUALITATIVE, REAL-TIME PCR: Toxigenic C. Difficile by PCR: NOT DETECTED

## 2020-03-26 ENCOUNTER — Ambulatory Visit (INDEPENDENT_AMBULATORY_CARE_PROVIDER_SITE_OTHER): Payer: Medicare Other | Admitting: Family Medicine

## 2020-03-26 ENCOUNTER — Encounter: Payer: Self-pay | Admitting: Family Medicine

## 2020-03-26 ENCOUNTER — Other Ambulatory Visit: Payer: Self-pay

## 2020-03-26 ENCOUNTER — Telehealth: Payer: Self-pay

## 2020-03-26 VITALS — HR 66 | Temp 97.8°F | Wt 218.2 lb

## 2020-03-26 DIAGNOSIS — R059 Cough, unspecified: Secondary | ICD-10-CM | POA: Diagnosis not present

## 2020-03-26 DIAGNOSIS — J019 Acute sinusitis, unspecified: Secondary | ICD-10-CM | POA: Diagnosis not present

## 2020-03-26 DIAGNOSIS — I255 Ischemic cardiomyopathy: Secondary | ICD-10-CM | POA: Diagnosis not present

## 2020-03-26 MED ORDER — DOXYCYCLINE HYCLATE 100 MG PO TABS: 100.0000 mg | ORAL_TABLET | Freq: Two times a day (BID) | ORAL | 0 refills | Status: DC

## 2020-03-26 NOTE — Telephone Encounter (Signed)
Pt called office because he hadn't heard results from stool test. Informed him Randall Hiss sent him a MyChart message today to inform him of results. Read result note to pt. He hadn't started probiotic. Gave him names of probiotics recommended by Randall Hiss per AVS note.

## 2020-03-26 NOTE — Progress Notes (Signed)
   Subjective:    Patient ID: Lance [name], male    DOB: 1946-03-16, 74 y.o.   MRN: [id number]  Sinusitis This is a new problem. Episode onset: 1 week. There has been no fever. Associated symptoms include congestion, ear pain, headaches and sinus pressure. (Wheezing ) Past treatments include nothing.   Head congestion sinus pressure denies high fever chills sweats   Review of Systems  HENT: Positive for congestion, ear pain and sinus pressure.   Neurological: Positive for headaches.       Objective:   Physical Exam Eardrums normal sinuses mild tenderness throat normal lungs clear heart regular Covid test taken await results If severe shortness of breath or other problems report to ER Patient was encouraged to lay low for a few days    Assessment & Plan:  Acute rhinosinusitis antibiotic prescribed warning signs discussed follow-up if problems

## 2020-03-28 LAB — NOVEL CORONAVIRUS, NAA: SARS-CoV-2, NAA: NOT DETECTED

## 2020-03-28 LAB — SARS-COV-2, NAA 2 DAY TAT

## 2020-04-03 ENCOUNTER — Ambulatory Visit (INDEPENDENT_AMBULATORY_CARE_PROVIDER_SITE_OTHER): Payer: Medicare Other

## 2020-04-03 ENCOUNTER — Encounter: Payer: Self-pay | Admitting: Nurse Practitioner

## 2020-04-03 DIAGNOSIS — I5022 Chronic systolic (congestive) heart failure: Secondary | ICD-10-CM

## 2020-04-03 LAB — CUP PACEART REMOTE DEVICE CHECK
Battery Remaining Longevity: 18 mo
Battery Remaining Percentage: 19 %
Battery Voltage: 2.77 V
Brady Statistic AP VP Percent: 1 %
Brady Statistic AP VS Percent: 1 %
Brady Statistic AS VP Percent: 1 %
Brady Statistic AS VS Percent: 99 %
Brady Statistic RA Percent Paced: 1 %
Brady Statistic RV Percent Paced: 1 %
Date Time Interrogation Session: 20211202040016
HighPow Impedance: 54 Ohm
HighPow Impedance: 54 Ohm
Implantable Lead Implant Date: 20050706
Implantable Lead Implant Date: 20161128
Implantable Lead Location: 753859
Implantable Lead Location: 753860
Implantable Lead Model: 7122
Implantable Pulse Generator Implant Date: 20130727
Lead Channel Impedance Value: 340 Ohm
Lead Channel Impedance Value: 390 Ohm
Lead Channel Pacing Threshold Amplitude: 0.75 V
Lead Channel Pacing Threshold Amplitude: 1 V
Lead Channel Pacing Threshold Pulse Width: 0.5 ms
Lead Channel Pacing Threshold Pulse Width: 0.5 ms
Lead Channel Sensing Intrinsic Amplitude: 10.8 mV
Lead Channel Sensing Intrinsic Amplitude: 2.5 mV
Lead Channel Setting Pacing Amplitude: 2 V
Lead Channel Setting Pacing Amplitude: 2.5 V
Lead Channel Setting Pacing Pulse Width: 0.5 ms
Lead Channel Setting Sensing Sensitivity: 0.5 mV
Pulse Gen Serial Number: 7041246

## 2020-04-08 ENCOUNTER — Telehealth: Payer: Self-pay

## 2020-04-08 ENCOUNTER — Other Ambulatory Visit: Payer: Self-pay | Admitting: *Deleted

## 2020-04-08 MED ORDER — AZITHROMYCIN 250 MG PO TABS: ORAL_TABLET | ORAL | 0 refills | Status: DC

## 2020-04-08 MED ORDER — PREDNISONE 20 MG PO TABS
ORAL_TABLET | ORAL | 0 refills | Status: DC
Start: 2020-04-08 — End: 2020-04-11

## 2020-04-08 MED ORDER — AZITHROMYCIN 250 MG PO TABS
ORAL_TABLET | ORAL | 0 refills | Status: DC
Start: 2020-04-08 — End: 2020-04-08

## 2020-04-08 NOTE — Telephone Encounter (Signed)
Meds sent to pharm. Tried to call pt no answer

## 2020-04-08 NOTE — Telephone Encounter (Signed)
Azithromycin for 5 days for sinus infection, Z-Pak-it is important to note on the prescription that this is for a sinus infection Prednisone 20 mg 2 daily for the next 5 days

## 2020-04-08 NOTE — Telephone Encounter (Signed)
Patient was 11/24 and states worst than before he still has runny nose,wheezing,congestion. [place]

## 2020-04-08 NOTE — Telephone Encounter (Signed)
Pt notified meds sent to pharm.  °

## 2020-04-08 NOTE — Telephone Encounter (Signed)
Seen on 11/24. Finished doxy. States symptoms never completely went away and feels worse now. States he gets this every year, runny nose, headache, coughing up mucus, states his wife says he is wheezing at night but he has not noticied any wheezing, no fever.

## 2020-04-11 ENCOUNTER — Ambulatory Visit (INDEPENDENT_AMBULATORY_CARE_PROVIDER_SITE_OTHER): Payer: Medicare Other | Admitting: Student

## 2020-04-11 ENCOUNTER — Encounter: Payer: Self-pay | Admitting: Student

## 2020-04-11 ENCOUNTER — Ambulatory Visit: Payer: Medicare Other | Admitting: Cardiology

## 2020-04-11 ENCOUNTER — Other Ambulatory Visit: Payer: Self-pay

## 2020-04-11 VITALS — BP 136/80 | HR 72 | Ht 69.0 in | Wt 218.0 lb

## 2020-04-11 DIAGNOSIS — Z95810 Presence of automatic (implantable) cardiac defibrillator: Secondary | ICD-10-CM

## 2020-04-11 DIAGNOSIS — I25118 Atherosclerotic heart disease of native coronary artery with other forms of angina pectoris: Secondary | ICD-10-CM | POA: Diagnosis not present

## 2020-04-11 DIAGNOSIS — E785 Hyperlipidemia, unspecified: Secondary | ICD-10-CM

## 2020-04-11 DIAGNOSIS — I5032 Chronic diastolic (congestive) heart failure: Secondary | ICD-10-CM | POA: Diagnosis not present

## 2020-04-11 DIAGNOSIS — I10 Essential (primary) hypertension: Secondary | ICD-10-CM | POA: Diagnosis not present

## 2020-04-11 DIAGNOSIS — I255 Ischemic cardiomyopathy: Secondary | ICD-10-CM

## 2020-04-11 MED ORDER — ATORVASTATIN CALCIUM 80 MG PO TABS: 80.0000 mg | ORAL_TABLET | Freq: Every day | ORAL | 3 refills | Status: DC

## 2020-04-11 MED ORDER — NITROGLYCERIN 0.4 MG SL SUBL: 0.4000 mg | SUBLINGUAL_TABLET | SUBLINGUAL | 3 refills | Status: DC | PRN

## 2020-04-11 NOTE — Patient Instructions (Signed)
Medication Instructions:  Your physician recommends that you continue on your current medications as directed. Please refer to the Current Medication list given to you today.  *If you need a refill on your cardiac medications before your next appointment, please call your pharmacy*   Lab Work: None today If you have labs (blood work) drawn today and your tests are completely normal, you will receive your results only by: . MyChart Message (if you have MyChart) OR . A paper copy in the mail If you have any lab test that is abnormal or we need to change your treatment, we will call you to review the results.   Testing/Procedures: None today   Follow-Up: At [facility], you and your health needs are our priority.  As part of our continuing mission to provide you with exceptional heart care, we have created designated Provider Care Teams.  These Care Teams include your primary Cardiologist (physician) and Advanced Practice Providers (APPs -  Physician Assistants and Nurse Practitioners) who all work together to provide you with the care you need, when you need it.  We recommend signing up for the patient portal called "MyChart".  Sign up information is provided on this After Visit Summary.  MyChart is used to connect with patients for Virtual Visits (Telemedicine).  Patients are able to view lab/test results, encounter notes, upcoming appointments, etc.  Non-urgent messages can be sent to your provider as well.   To learn more about what you can do with MyChart, go to [email or web address].    Your next appointment:   6 month(s)  The format for your next appointment:   In Person  Provider:   Jonathan Branch, MD   Other Instructions None       Thank you for choosing [facility] !         

## 2020-04-11 NOTE — Progress Notes (Signed)
Remote ICD transmission.   
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
## 2020-04-12 ENCOUNTER — Encounter: Payer: Self-pay | Admitting: Student

## 2020-04-16 ENCOUNTER — Telehealth: Payer: Self-pay

## 2020-04-16 NOTE — Telephone Encounter (Signed)
Patient is wanting to know if he suppose to still be taking lasix 20 mg because it hasnt been called in yet. He seen it on his medication list but doesn't have medication. Please advise [place]

## 2020-04-16 NOTE — Telephone Encounter (Signed)
Nurses If the patient has not taken this medicine in over a year-I would suggest that he has a follow-up visit either with [place] or with cardiology.  Is he currently having any significant swelling in the ankles?

## 2020-04-17 ENCOUNTER — Other Ambulatory Visit: Payer: Self-pay | Admitting: Family Medicine

## 2020-04-17 NOTE — Telephone Encounter (Signed)
Pt states he has not taken med in over one year. States it was stopped due to dizziness and he is not having any swelling in ankles. States he does not want any more of that med because it made him dizzy. Does not need a call back unless you have further recommendation. States his wife left that message when she saw that med listed as a med he was taking and he was not concerned about it.

## 2020-04-17 NOTE — Telephone Encounter (Signed)
FYI-Lasix was taken off of his list.  I would not consider dizziness to be a true side effect more likely it was he was mildly dehydrated by the diuretic therefore we will hold off on a diuretic and only utilize it should he start showing signs of swelling in the legs thank you

## 2020-04-21 ENCOUNTER — Other Ambulatory Visit: Payer: Self-pay | Admitting: Cardiology

## 2020-04-24 DIAGNOSIS — M542 Cervicalgia: Secondary | ICD-10-CM | POA: Diagnosis not present

## 2020-04-24 DIAGNOSIS — Z79899 Other long term (current) drug therapy: Secondary | ICD-10-CM | POA: Diagnosis not present

## 2020-04-24 DIAGNOSIS — M791 Myalgia, unspecified site: Secondary | ICD-10-CM | POA: Diagnosis not present

## 2020-04-24 DIAGNOSIS — G894 Chronic pain syndrome: Secondary | ICD-10-CM | POA: Diagnosis not present

## 2020-04-24 DIAGNOSIS — M65331 Trigger finger, right middle finger: Secondary | ICD-10-CM | POA: Diagnosis not present

## 2020-04-24 DIAGNOSIS — M79641 Pain in right hand: Secondary | ICD-10-CM | POA: Diagnosis not present

## 2020-04-24 DIAGNOSIS — Z79891 Long term (current) use of opiate analgesic: Secondary | ICD-10-CM | POA: Diagnosis not present

## 2020-04-28 ENCOUNTER — Other Ambulatory Visit: Payer: Self-pay | Admitting: *Deleted

## 2020-04-28 DIAGNOSIS — I6522 Occlusion and stenosis of left carotid artery: Secondary | ICD-10-CM

## 2020-05-01 ENCOUNTER — Other Ambulatory Visit: Payer: Self-pay

## 2020-05-01 ENCOUNTER — Ambulatory Visit (INDEPENDENT_AMBULATORY_CARE_PROVIDER_SITE_OTHER): Payer: Medicare Other | Admitting: Family Medicine

## 2020-05-01 DIAGNOSIS — J019 Acute sinusitis, unspecified: Secondary | ICD-10-CM

## 2020-05-01 DIAGNOSIS — R059 Cough, unspecified: Secondary | ICD-10-CM | POA: Diagnosis not present

## 2020-05-01 DIAGNOSIS — I255 Ischemic cardiomyopathy: Secondary | ICD-10-CM | POA: Diagnosis not present

## 2020-05-01 MED ORDER — AZITHROMYCIN 250 MG PO TABS: ORAL_TABLET | ORAL | 0 refills | Status: DC

## 2020-05-01 MED ORDER — AZELASTINE HCL 0.1 % NA SOLN
2.0000 | Freq: Two times a day (BID) | NASAL | 12 refills | Status: DC
Start: 2020-05-01 — End: 2021-05-12

## 2020-05-01 NOTE — Progress Notes (Signed)
   Subjective:    Patient ID: Lance [name], male    DOB: 09-Sep-1945, 74 y.o.   MRN: [id number]  Sinus Problem This is a new problem. The current episode started 1 to 4 weeks ago. Associated symptoms include congestion and sinus pressure. (Ears ringing)   Has had 2 rounds of antbiotics Patient has been on antibiotics and has head congestion drainage coughing denies wheezing difficulty breathing  Review of Systems  HENT: Positive for congestion and sinus pressure.   Please see above     Objective:   Physical Exam Eardrums normal nares are normal throat is normal neck no masses lungs clear heart regular   Covid test taken    Assessment & Plan:  Acute rhinosinusitis Z-Pak as directed warning signs discussed Astelin nasal spray on a regular basis If progressive troubles or problems follow-up may need referral to ENT if he keeps having this

## 2020-05-03 LAB — NOVEL CORONAVIRUS, NAA: SARS-CoV-2, NAA: NOT DETECTED

## 2020-05-03 LAB — SARS-COV-2, NAA 2 DAY TAT

## 2020-05-06 ENCOUNTER — Other Ambulatory Visit: Payer: Self-pay

## 2020-05-06 ENCOUNTER — Ambulatory Visit (HOSPITAL_COMMUNITY)
Admission: RE | Admit: 2020-05-06 | Discharge: 2020-05-06 | Disposition: A | Discharge status: Home / Self Care | Payer: Medicare Other | Source: Ambulatory Visit | Attending: Family Medicine | Admitting: Family Medicine

## 2020-05-06 DIAGNOSIS — I6523 Occlusion and stenosis of bilateral carotid arteries: Secondary | ICD-10-CM | POA: Diagnosis not present

## 2020-05-06 DIAGNOSIS — I10 Essential (primary) hypertension: Secondary | ICD-10-CM | POA: Diagnosis not present

## 2020-05-06 DIAGNOSIS — I6522 Occlusion and stenosis of left carotid artery: Secondary | ICD-10-CM | POA: Diagnosis not present

## 2020-05-06 DIAGNOSIS — E785 Hyperlipidemia, unspecified: Secondary | ICD-10-CM | POA: Diagnosis not present

## 2020-05-13 ENCOUNTER — Ambulatory Visit: Payer: Medicare Other | Admitting: Nurse Practitioner

## 2020-05-21 ENCOUNTER — Ambulatory Visit (INDEPENDENT_AMBULATORY_CARE_PROVIDER_SITE_OTHER): Payer: Medicare Other | Admitting: Internal Medicine

## 2020-05-21 ENCOUNTER — Other Ambulatory Visit: Payer: Self-pay

## 2020-05-21 ENCOUNTER — Encounter: Payer: Self-pay | Admitting: Internal Medicine

## 2020-05-21 VITALS — BP 124/70 | HR 60 | Ht 69.0 in | Wt 222.2 lb

## 2020-05-21 DIAGNOSIS — I255 Ischemic cardiomyopathy: Secondary | ICD-10-CM

## 2020-05-21 LAB — CUP PACEART INCLINIC DEVICE CHECK
Brady Statistic RA Percent Paced: 1 % — CL
Brady Statistic RV Percent Paced: 1 % — CL
Date Time Interrogation Session: 20220119141541
Implantable Lead Implant Date: 20050706
Implantable Lead Implant Date: 20161128
Implantable Lead Location: 753859
Implantable Lead Location: 753860
Implantable Lead Model: 7122
Implantable Pulse Generator Implant Date: 20130727
Lead Channel Pacing Threshold Amplitude: 0.75 V
Lead Channel Pacing Threshold Amplitude: 1 V
Lead Channel Pacing Threshold Pulse Width: 0.5 ms
Lead Channel Pacing Threshold Pulse Width: 0.5 ms
Lead Channel Sensing Intrinsic Amplitude: 10.8 mV
Lead Channel Sensing Intrinsic Amplitude: 2.1 mV
Pulse Gen Serial Number: 7041246

## 2020-05-21 NOTE — Patient Instructions (Signed)
Medication Instructions:  Your physician recommends that you continue on your current medications as directed. Please refer to the Current Medication list given to you today.  *If you need a refill on your cardiac medications before your next appointment, please call your pharmacy*   Lab Work: None Today If you have labs (blood work) drawn today and your tests are completely normal, you will receive your results only by: . MyChart Message (if you have MyChart) OR . A paper copy in the mail If you have any lab test that is abnormal or we need to change your treatment, we will call you to review the results.   Testing/Procedures: None Today   Follow-Up: At [facility], you and your health needs are our priority.  As part of our continuing mission to provide you with exceptional heart care, we have created designated Provider Care Teams.  These Care Teams include your primary Cardiologist (physician) and Advanced Practice Providers (APPs -  Physician Assistants and Nurse Practitioners) who all work together to provide you with the care you need, when you need it.  We recommend signing up for the patient portal called "MyChart".  Sign up information is provided on this After Visit Summary.  MyChart is used to connect with patients for Virtual Visits (Telemedicine).  Patients are able to view lab/test results, encounter notes, upcoming appointments, etc.  Non-urgent messages can be sent to your provider as well.   To learn more about what you can do with MyChart, go to [email or web address].    Your next appointment:   12 month(s)  The format for your next appointment:   In Person  Provider:   Gregg Taylor, MD   Other Instructions None Today     
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
## 2020-05-22 ENCOUNTER — Encounter: Payer: Self-pay | Admitting: Internal Medicine

## 2020-05-22 ENCOUNTER — Ambulatory Visit (INDEPENDENT_AMBULATORY_CARE_PROVIDER_SITE_OTHER): Payer: Medicare Other | Admitting: Nurse Practitioner

## 2020-05-22 ENCOUNTER — Encounter: Payer: Self-pay | Admitting: Nurse Practitioner

## 2020-05-22 VITALS — BP 129/81 | HR 62 | Temp 96.8°F | Ht 69.0 in | Wt 218.2 lb

## 2020-05-22 DIAGNOSIS — K219 Gastro-esophageal reflux disease without esophagitis: Secondary | ICD-10-CM | POA: Diagnosis not present

## 2020-05-22 DIAGNOSIS — R1319 Other dysphagia: Secondary | ICD-10-CM | POA: Diagnosis not present

## 2020-05-22 DIAGNOSIS — R197 Diarrhea, unspecified: Secondary | ICD-10-CM | POA: Diagnosis not present

## 2020-05-22 DIAGNOSIS — I255 Ischemic cardiomyopathy: Secondary | ICD-10-CM | POA: Diagnosis not present

## 2020-05-22 DIAGNOSIS — R140 Abdominal distension (gaseous): Secondary | ICD-10-CM

## 2020-05-22 NOTE — Patient Instructions (Addendum)
Your health issues we discussed today were:   GERD (reflux/heartburn): 1. Lance [name] is doing well for you examination point 2. Continue taking Protonix 40 mg twice daily 3. Let us know if you have any worsening or severe symptoms  Diarrhea with flatulence (gas) and bloating: 1. I am glad you are doing better! 2. You can continue to use probiotics if you would like.  Some people scented benefit from this, others do not 3. As we discussed previously, your stool studies do not show an obvious infection requiring antibiotics 4. You likely had a virus that caused a bunch of diarrhea and GI distress which is since resolved 5. Let us know if you have any returning diarrhea or other recurrent symptoms  Overall I recommend:  1. Continue other current medications 2. Return for follow-up in 6 months 3. Call if you have any questions or concerns    At [facility] we value your feedback. You may receive a survey about your visit today. Please share your experience as we strive to create trusting relationships with our patients to provide genuine, compassionate, quality care.  We appreciate your understanding and patience as we review any laboratory studies, imaging, and other diagnostic tests that are ordered as we care for you. Our office policy is 5 business days for review of these results, and any emergent or urgent results are addressed in a timely manner for your best interest. If you do not hear from our office in 1 week, please contact us.   We also encourage the use of MyChart, which contains your medical information for your review as well. If you are not enrolled in this feature, an access code is on this after visit summary for your convenience. Thank you for allowing [place] to be involved in your care.  It was great to see you today!  I hope you have a safe and warm winter!!

## 2020-05-22 NOTE — Progress Notes (Signed)
CC'ED TO PCP 

## 2020-05-22 NOTE — Progress Notes (Signed)
Cc'ed to pcp °
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
## 2020-05-26 DIAGNOSIS — Z794 Long term (current) use of insulin: Secondary | ICD-10-CM | POA: Diagnosis not present

## 2020-05-26 DIAGNOSIS — E1165 Type 2 diabetes mellitus with hyperglycemia: Secondary | ICD-10-CM | POA: Diagnosis not present

## 2020-05-26 DIAGNOSIS — I2510 Atherosclerotic heart disease of native coronary artery without angina pectoris: Secondary | ICD-10-CM | POA: Diagnosis not present

## 2020-05-26 DIAGNOSIS — E1142 Type 2 diabetes mellitus with diabetic polyneuropathy: Secondary | ICD-10-CM | POA: Diagnosis not present

## 2020-05-27 DIAGNOSIS — B351 Tinea unguium: Secondary | ICD-10-CM | POA: Diagnosis not present

## 2020-05-27 DIAGNOSIS — E113593 Type 2 diabetes mellitus with proliferative diabetic retinopathy without macular edema, bilateral: Secondary | ICD-10-CM | POA: Diagnosis not present

## 2020-05-27 DIAGNOSIS — E1142 Type 2 diabetes mellitus with diabetic polyneuropathy: Secondary | ICD-10-CM | POA: Diagnosis not present

## 2020-06-25 DIAGNOSIS — G894 Chronic pain syndrome: Secondary | ICD-10-CM | POA: Diagnosis not present

## 2020-06-25 DIAGNOSIS — M542 Cervicalgia: Secondary | ICD-10-CM | POA: Diagnosis not present

## 2020-06-25 DIAGNOSIS — M791 Myalgia, unspecified site: Secondary | ICD-10-CM | POA: Diagnosis not present

## 2020-06-27 ENCOUNTER — Other Ambulatory Visit: Payer: Self-pay | Admitting: Family Medicine

## 2020-07-03 ENCOUNTER — Ambulatory Visit (INDEPENDENT_AMBULATORY_CARE_PROVIDER_SITE_OTHER): Payer: Medicare Other

## 2020-07-03 DIAGNOSIS — I255 Ischemic cardiomyopathy: Secondary | ICD-10-CM | POA: Diagnosis not present

## 2020-07-07 LAB — CUP PACEART REMOTE DEVICE CHECK
Battery Remaining Longevity: 16 mo
Battery Remaining Percentage: 16 %
Battery Voltage: 2.74 V
Brady Statistic AP VP Percent: 1 %
Brady Statistic AP VS Percent: 1 %
Brady Statistic AS VP Percent: 1 %
Brady Statistic AS VS Percent: 99 %
Brady Statistic RA Percent Paced: 1 %
Brady Statistic RV Percent Paced: 1 %
Date Time Interrogation Session: 20220303061257
HighPow Impedance: 53 Ohm
HighPow Impedance: 53 Ohm
Implantable Lead Implant Date: 20050706
Implantable Lead Implant Date: 20161128
Implantable Lead Location: 753859
Implantable Lead Location: 753860
Implantable Lead Model: 7122
Implantable Pulse Generator Implant Date: 20130727
Lead Channel Impedance Value: 340 Ohm
Lead Channel Impedance Value: 390 Ohm
Lead Channel Pacing Threshold Amplitude: 0.75 V
Lead Channel Pacing Threshold Amplitude: 1 V
Lead Channel Pacing Threshold Pulse Width: 0.5 ms
Lead Channel Pacing Threshold Pulse Width: 0.5 ms
Lead Channel Sensing Intrinsic Amplitude: 2 mV
Lead Channel Sensing Intrinsic Amplitude: 8 mV
Lead Channel Setting Pacing Amplitude: 2 V
Lead Channel Setting Pacing Amplitude: 2.5 V
Lead Channel Setting Pacing Pulse Width: 0.5 ms
Lead Channel Setting Sensing Sensitivity: 0.5 mV
Pulse Gen Serial Number: 7041246

## 2020-07-14 NOTE — Progress Notes (Signed)
Remote ICD transmission.   

## 2020-07-15 ENCOUNTER — Other Ambulatory Visit: Payer: Self-pay | Admitting: Family Medicine

## 2020-07-15 ENCOUNTER — Other Ambulatory Visit: Payer: Self-pay | Admitting: Adult Health

## 2020-07-15 NOTE — Telephone Encounter (Signed)
May have 90-day of each please schedule appointment for follow-up in April

## 2020-07-16 NOTE — Telephone Encounter (Signed)
Please schedule and then route back so refills can be sent in

## 2020-07-17 NOTE — Telephone Encounter (Signed)
Patient schedule appointment for 4/21 for medication follow up

## 2020-07-21 ENCOUNTER — Emergency Department (HOSPITAL_COMMUNITY)
Admission: EM | Admit: 2020-07-21 | Discharge: 2020-07-21 | Disposition: A | Discharge status: Home / Self Care | Payer: Medicare Other | Attending: Emergency Medicine | Admitting: Emergency Medicine

## 2020-07-21 ENCOUNTER — Other Ambulatory Visit: Payer: Self-pay

## 2020-07-21 ENCOUNTER — Encounter (HOSPITAL_COMMUNITY): Payer: Self-pay

## 2020-07-21 ENCOUNTER — Emergency Department (HOSPITAL_COMMUNITY): Payer: Medicare Other

## 2020-07-21 DIAGNOSIS — Z7982 Long term (current) use of aspirin: Secondary | ICD-10-CM | POA: Insufficient documentation

## 2020-07-21 DIAGNOSIS — I509 Heart failure, unspecified: Secondary | ICD-10-CM | POA: Diagnosis not present

## 2020-07-21 DIAGNOSIS — S0502XA Injury of conjunctiva and corneal abrasion without foreign body, left eye, initial encounter: Secondary | ICD-10-CM

## 2020-07-21 DIAGNOSIS — Z7902 Long term (current) use of antithrombotics/antiplatelets: Secondary | ICD-10-CM | POA: Insufficient documentation

## 2020-07-21 DIAGNOSIS — E113512 Type 2 diabetes mellitus with proliferative diabetic retinopathy with macular edema, left eye: Secondary | ICD-10-CM | POA: Diagnosis not present

## 2020-07-21 DIAGNOSIS — R110 Nausea: Secondary | ICD-10-CM | POA: Insufficient documentation

## 2020-07-21 DIAGNOSIS — Z91040 Latex allergy status: Secondary | ICD-10-CM | POA: Diagnosis not present

## 2020-07-21 DIAGNOSIS — Z951 Presence of aortocoronary bypass graft: Secondary | ICD-10-CM | POA: Insufficient documentation

## 2020-07-21 DIAGNOSIS — R42 Dizziness and giddiness: Secondary | ICD-10-CM | POA: Insufficient documentation

## 2020-07-21 DIAGNOSIS — I110 Hypertensive heart disease with heart failure: Secondary | ICD-10-CM | POA: Insufficient documentation

## 2020-07-21 DIAGNOSIS — J449 Chronic obstructive pulmonary disease, unspecified: Secondary | ICD-10-CM | POA: Diagnosis not present

## 2020-07-21 DIAGNOSIS — R079 Chest pain, unspecified: Secondary | ICD-10-CM

## 2020-07-21 DIAGNOSIS — Z96652 Presence of left artificial knee joint: Secondary | ICD-10-CM | POA: Insufficient documentation

## 2020-07-21 DIAGNOSIS — Z87891 Personal history of nicotine dependence: Secondary | ICD-10-CM | POA: Insufficient documentation

## 2020-07-21 DIAGNOSIS — Z950 Presence of cardiac pacemaker: Secondary | ICD-10-CM | POA: Insufficient documentation

## 2020-07-21 DIAGNOSIS — Z79899 Other long term (current) drug therapy: Secondary | ICD-10-CM | POA: Diagnosis not present

## 2020-07-21 DIAGNOSIS — R0789 Other chest pain: Secondary | ICD-10-CM | POA: Insufficient documentation

## 2020-07-21 DIAGNOSIS — E1142 Type 2 diabetes mellitus with diabetic polyneuropathy: Secondary | ICD-10-CM | POA: Insufficient documentation

## 2020-07-21 DIAGNOSIS — Z794 Long term (current) use of insulin: Secondary | ICD-10-CM | POA: Insufficient documentation

## 2020-07-21 DIAGNOSIS — R61 Generalized hyperhidrosis: Secondary | ICD-10-CM | POA: Diagnosis not present

## 2020-07-21 DIAGNOSIS — I2510 Atherosclerotic heart disease of native coronary artery without angina pectoris: Secondary | ICD-10-CM | POA: Diagnosis not present

## 2020-07-21 DIAGNOSIS — X58XXXA Exposure to other specified factors, initial encounter: Secondary | ICD-10-CM | POA: Insufficient documentation

## 2020-07-21 DIAGNOSIS — S0592XA Unspecified injury of left eye and orbit, initial encounter: Secondary | ICD-10-CM | POA: Diagnosis present

## 2020-07-21 LAB — BASIC METABOLIC PANEL
Anion gap: 9 (ref 5–15)
BUN: 17 mg/dL (ref 8–23)
CO2: 25 mmol/L (ref 22–32)
Calcium: 9.4 mg/dL (ref 8.9–10.3)
Chloride: 105 mmol/L (ref 98–111)
Creatinine, Ser: 1.05 mg/dL (ref 0.61–1.24)
GFR, Estimated: >60 mL/min (ref >60)
Glucose, Bld: 130 mg/dL — ABNORMAL HIGH (ref 70–99)
Potassium: 3.6 mmol/L (ref 3.5–5.1)
Sodium: 139 mmol/L (ref 135–145)

## 2020-07-21 LAB — CBC WITH DIFFERENTIAL/PLATELET
Abs Immature Granulocytes: 0.02 10*3/uL (ref 0.00–0.07)
Basophils Absolute: 0.1 10*3/uL (ref 0.0–0.1)
Basophils Relative: 1 %
Eosinophils Absolute: 0.1 10*3/uL (ref 0.0–0.5)
Eosinophils Relative: 1 %
HCT: 43.9 % (ref 39.0–52.0)
Hemoglobin: 14.6 g/dL (ref 13.0–17.0)
Immature Granulocytes: 0 %
Lymphocytes Relative: 36 %
Lymphs Abs: 2.3 10*3/uL (ref 0.7–4.0)
MCH: 29.1 pg (ref 26.0–34.0)
MCHC: 33.3 g/dL (ref 30.0–36.0)
MCV: 87.6 fL (ref 80.0–100.0)
Monocytes Absolute: 0.7 10*3/uL (ref 0.1–1.0)
Monocytes Relative: 10 %
Neutro Abs: 3.3 10*3/uL (ref 1.7–7.7)
Neutrophils Relative %: 52 %
Platelets: 148 10*3/uL — ABNORMAL LOW (ref 150–400)
RBC: 5.01 MIL/uL (ref 4.22–5.81)
RDW: 14.5 % (ref 11.5–15.5)
WBC: 6.4 10*3/uL (ref 4.0–10.5)
nRBC: 0 % (ref 0.0–0.2)

## 2020-07-21 LAB — TROPONIN I (HIGH SENSITIVITY)
Troponin I (High Sensitivity): 5 ng/L (ref <18)
Troponin I (High Sensitivity): 4 ng/L (ref <18)

## 2020-07-21 LAB — D-DIMER, QUANTITATIVE: D-Dimer, Quant: 0.53 ug/mL-FEU — ABNORMAL HIGH (ref 0.00–0.50)

## 2020-07-21 MED ORDER — FLUORESCEIN SODIUM 1 MG OP STRP
1.0000 | ORAL_STRIP | Freq: Once | OPHTHALMIC | Status: AC
  Administered 2020-07-21: 1 via OPHTHALMIC
  Filled 2020-07-21: qty 1

## 2020-07-21 MED ORDER — ASPIRIN 81 MG PO CHEW
324.0000 mg | CHEWABLE_TABLET | Freq: Once | ORAL | Status: AC
  Administered 2020-07-21: 324 mg via ORAL
  Filled 2020-07-21: qty 4

## 2020-07-21 MED ORDER — ERYTHROMYCIN 5 MG/GM OP OINT
TOPICAL_OINTMENT | Freq: Once | OPHTHALMIC | Status: AC
  Administered 2020-07-21: 1 via OPHTHALMIC
  Filled 2020-07-21: qty 3.5

## 2020-07-21 MED ORDER — TETRACAINE HCL 0.5 % OP SOLN
2.0000 [drp] | Freq: Once | OPHTHALMIC | Status: AC
  Administered 2020-07-21: 2 [drp] via OPHTHALMIC
  Filled 2020-07-21: qty 4

## 2020-07-21 NOTE — ED Triage Notes (Signed)
Pt reports had something in his eye and was at the eye doctor this morning when he had sudden onset of r sided chest pain.  Reports became sob, nauseated, and diaphoretic.  Pt says pain lasted approx 5 min and felt like pressure.  Denies pain at this time but says still feels sob.

## 2020-07-21 NOTE — Discharge Instructions (Addendum)
If you develop recurrent, continued, or worsening chest pain, shortness of breath, fever, vomiting, abdominal or back pain, or any other new/concerning symptoms then return to the ER for evaluation.   Use the erythromycin ointment 4 times per day for 5 days. Do NOT wear any contact lenses.
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
## 2020-07-24 ENCOUNTER — Encounter: Payer: Self-pay | Admitting: Family Medicine

## 2020-07-24 ENCOUNTER — Encounter: Payer: Self-pay | Admitting: *Deleted

## 2020-07-24 ENCOUNTER — Ambulatory Visit (INDEPENDENT_AMBULATORY_CARE_PROVIDER_SITE_OTHER): Payer: Medicare Other | Admitting: Family Medicine

## 2020-07-24 VITALS — BP 124/72 | HR 69 | Ht 69.0 in | Wt 218.4 lb

## 2020-07-24 DIAGNOSIS — R079 Chest pain, unspecified: Secondary | ICD-10-CM

## 2020-07-24 DIAGNOSIS — E782 Mixed hyperlipidemia: Secondary | ICD-10-CM

## 2020-07-24 DIAGNOSIS — I10 Essential (primary) hypertension: Secondary | ICD-10-CM | POA: Diagnosis not present

## 2020-07-24 DIAGNOSIS — Z95810 Presence of automatic (implantable) cardiac defibrillator: Secondary | ICD-10-CM

## 2020-07-24 DIAGNOSIS — R0602 Shortness of breath: Secondary | ICD-10-CM | POA: Diagnosis not present

## 2020-07-24 DIAGNOSIS — I2510 Atherosclerotic heart disease of native coronary artery without angina pectoris: Secondary | ICD-10-CM | POA: Diagnosis not present

## 2020-07-24 DIAGNOSIS — I5032 Chronic diastolic (congestive) heart failure: Secondary | ICD-10-CM | POA: Diagnosis not present

## 2020-07-24 NOTE — Patient Instructions (Signed)
Medication Instructions:  Continue all current medications.  Labwork: none  Testing/Procedures:  Your physician has requested that you have an echocardiogram. Echocardiography is a painless test that uses sound waves to create images of your heart. It provides your doctor with information about the size and shape of your heart and how well your heart's chambers and valves are working. This procedure takes approximately one hour. There are no restrictions for this procedure.  Your physician has requested that you have a lexiscan myoview. For further information please visit [email or web address]. Please follow instruction sheet, as given.  Office will contact with results via phone or letter.    Follow-Up: 4-6 weeks   Any Other Special Instructions Will Be Listed Below (If Applicable).  If you need a refill on your cardiac medications before your next appointment, please call your pharmacy.

## 2020-07-30 ENCOUNTER — Other Ambulatory Visit: Payer: Self-pay | Admitting: Family Medicine

## 2020-07-31 ENCOUNTER — Telehealth: Payer: Self-pay | Admitting: *Deleted

## 2020-07-31 NOTE — Telephone Encounter (Signed)
Patient advised per Dr Nicki Reaper to not take the xanax and hydrocodone at the same time and if he experiences drowsiness not to drive. Patient verbalized understanding and stated he does not take the medications at the same time

## 2020-07-31 NOTE — Telephone Encounter (Signed)
Tammy the pharmacist at [facility] called concerning script sent in yesterday fir xanax. Pharmacist wants doctor to be aware that patient is also filling monthly hydrocodone 7.5/325 #60 a month at a different pharmacy thru specialist. Consult with Dr Nicki Reaper: may fill medication this time and cancel refills and he will discuss with patient on Monday.

## 2020-08-04 ENCOUNTER — Other Ambulatory Visit: Payer: Self-pay | Admitting: Family Medicine

## 2020-08-05 ENCOUNTER — Encounter (HOSPITAL_COMMUNITY)
Admission: RE | Admit: 2020-08-05 | Discharge: 2020-08-05 | Disposition: A | Discharge status: Home / Self Care | Payer: Medicare Other | Source: Ambulatory Visit | Attending: Family Medicine | Admitting: Family Medicine

## 2020-08-05 ENCOUNTER — Encounter (HOSPITAL_BASED_OUTPATIENT_CLINIC_OR_DEPARTMENT_OTHER)
Admission: RE | Admit: 2020-08-05 | Discharge: 2020-08-05 | Disposition: A | Discharge status: Home / Self Care | Payer: Medicare Other | Source: Ambulatory Visit | Attending: Family Medicine | Admitting: Family Medicine

## 2020-08-05 ENCOUNTER — Encounter (HOSPITAL_COMMUNITY): Payer: Self-pay

## 2020-08-05 DIAGNOSIS — R079 Chest pain, unspecified: Secondary | ICD-10-CM | POA: Insufficient documentation

## 2020-08-05 HISTORY — DX: Systemic involvement of connective tissue, unspecified: M35.9

## 2020-08-05 LAB — NM MYOCAR MULTI W/SPECT W/WALL MOTION / EF
LV dias vol: 120 mL (ref 62–150)
LV sys vol: 52 mL
Peak HR: 80 {beats}/min
RATE: 0.37
Rest HR: 64 {beats}/min
SDS: 3
SRS: 9
SSS: 12
TID: 1.32

## 2020-08-05 MED ORDER — REGADENOSON 0.4 MG/5ML IV SOLN
INTRAVENOUS | Status: AC
  Administered 2020-08-05: 0.4 mg via INTRAVENOUS
  Filled 2020-08-05: qty 5

## 2020-08-05 MED ORDER — TECHNETIUM TC 99M TETROFOSMIN IV KIT
10.0000 | PACK | Freq: Once | INTRAVENOUS | Status: AC | PRN
  Administered 2020-08-05: 10.2 via INTRAVENOUS

## 2020-08-05 MED ORDER — TECHNETIUM TC 99M TETROFOSMIN IV KIT
30.0000 | PACK | Freq: Once | INTRAVENOUS | Status: AC | PRN
  Administered 2020-08-05: 31 via INTRAVENOUS

## 2020-08-05 MED ORDER — SODIUM CHLORIDE FLUSH 0.9 % IV SOLN
INTRAVENOUS | Status: AC
  Administered 2020-08-05: 10 mL via INTRAVENOUS
  Filled 2020-08-05: qty 10

## 2020-08-07 ENCOUNTER — Telehealth: Payer: Self-pay | Admitting: *Deleted

## 2020-08-07 NOTE — Telephone Encounter (Signed)
Laurine Blazer, LPN  09/04/5623 6:38 PM EDT Back to Top     Notified, copy to pcp.    Laurine Blazer, LPN  01/03/7341 8:76 PM EDT      Left message to return call.   Verta Ellen., NP  08/05/2020 5:06 PM EDT      Please call the patient and let him know the stress test was considered a low risk study. Evidence of prior heart attack but no current evidence of decreased blood flow to the heart muscle per interpreting physician.

## 2020-08-07 NOTE — Telephone Encounter (Deleted)
Laurine Blazer, LPN  05/08/1094 0:45 PM EDT Back to Top     Notified, copy to pcp.    Laurine Blazer, LPN  4/0/9811 9:14 PM EDT      Left message to return call.   Verta Ellen., NP  08/05/2020 5:06 PM EDT      Please call the patient and let him know the stress test was considered a low risk study. Evidence of prior heart

## 2020-08-12 ENCOUNTER — Ambulatory Visit (INDEPENDENT_AMBULATORY_CARE_PROVIDER_SITE_OTHER): Payer: Medicare Other

## 2020-08-12 DIAGNOSIS — R0602 Shortness of breath: Secondary | ICD-10-CM

## 2020-08-12 LAB — ECHOCARDIOGRAM COMPLETE
AR max vel: 2.95 cm2
AV Area VTI: 3.31 cm2
AV Area mean vel: 3.01 cm2
AV Mean grad: 3.9 mmHg
AV Peak grad: 8.6 mmHg
Ao pk vel: 1.46 m/s
Area-P 1/2: 4.49 cm2
Calc EF: 51 %
MV M vel: 3.49 m/s
MV Peak grad: 48.6 mmHg
S' Lateral: 3.6 cm
Single Plane A2C EF: 45.7 %
Single Plane A4C EF: 51.3 %

## 2020-08-13 ENCOUNTER — Telehealth: Payer: Self-pay | Admitting: *Deleted

## 2020-08-13 NOTE — Telephone Encounter (Signed)
Laurine Blazer, LPN  10/20/3557 7:41 PM EDT Back to Top     Wife Oris Drone) notified. Copy to pcp.

## 2020-08-13 NOTE — Telephone Encounter (Signed)
-----   Message from Verta Ellen., NP sent at 08/12/2020  4:29 PM EDT ----- Please call the patient and let him know the echocardiogram showed his pumping function is in the low normal range as it was back in July of last year.  This is unchanged.  The main pumping chamber is a little more muscular than usual.  Best management is to maintain blood pressure at or below 130s over 80s.  Everything else looks good.  Nothing on the echocardiogram that would contribute to shortness of breath.
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
## 2020-08-19 ENCOUNTER — Encounter: Payer: Self-pay | Admitting: Family Medicine

## 2020-08-19 ENCOUNTER — Ambulatory Visit (INDEPENDENT_AMBULATORY_CARE_PROVIDER_SITE_OTHER): Payer: Medicare Other | Admitting: Family Medicine

## 2020-08-19 VITALS — BP 142/82 | HR 61 | Ht 69.0 in | Wt 218.4 lb

## 2020-08-19 DIAGNOSIS — I5032 Chronic diastolic (congestive) heart failure: Secondary | ICD-10-CM | POA: Diagnosis not present

## 2020-08-19 DIAGNOSIS — I2510 Atherosclerotic heart disease of native coronary artery without angina pectoris: Secondary | ICD-10-CM | POA: Diagnosis not present

## 2020-08-19 DIAGNOSIS — E782 Mixed hyperlipidemia: Secondary | ICD-10-CM

## 2020-08-19 DIAGNOSIS — I10 Essential (primary) hypertension: Secondary | ICD-10-CM

## 2020-08-19 NOTE — Patient Instructions (Signed)
Medication Instructions:  Continue all current medications.   Labwork: none  Testing/Procedures: none  Follow-Up: 6 months   Any Other Special Instructions Will Be Listed Below (If Applicable).   If you need a refill on your cardiac medications before your next appointment, please call your pharmacy.  

## 2020-08-21 ENCOUNTER — Ambulatory Visit (INDEPENDENT_AMBULATORY_CARE_PROVIDER_SITE_OTHER): Payer: Medicare Other | Admitting: Family Medicine

## 2020-08-21 ENCOUNTER — Other Ambulatory Visit: Payer: Self-pay

## 2020-08-21 ENCOUNTER — Encounter: Payer: Self-pay | Admitting: Family Medicine

## 2020-08-21 VITALS — BP 138/82 | HR 58 | Temp 97.5°F | Ht 69.0 in | Wt 221.0 lb

## 2020-08-21 DIAGNOSIS — E1159 Type 2 diabetes mellitus with other circulatory complications: Secondary | ICD-10-CM | POA: Diagnosis not present

## 2020-08-21 DIAGNOSIS — I255 Ischemic cardiomyopathy: Secondary | ICD-10-CM | POA: Diagnosis not present

## 2020-08-21 DIAGNOSIS — I10 Essential (primary) hypertension: Secondary | ICD-10-CM | POA: Diagnosis not present

## 2020-08-21 DIAGNOSIS — F439 Reaction to severe stress, unspecified: Secondary | ICD-10-CM

## 2020-08-21 DIAGNOSIS — Z23 Encounter for immunization: Secondary | ICD-10-CM | POA: Diagnosis not present

## 2020-08-21 MED ORDER — ALPRAZOLAM 0.5 MG PO TABS: ORAL_TABLET | ORAL | 4 refills | Status: DC

## 2020-08-21 MED ORDER — LOSARTAN POTASSIUM 25 MG PO TABS: 25.0000 mg | ORAL_TABLET | Freq: Every day | ORAL | 1 refills | Status: DC

## 2020-08-21 NOTE — Progress Notes (Signed)
   Subjective:    Patient ID: Lance [name], male    DOB: [birth year], 75 y.o.   MRN: [id number]  Hypertension This is a chronic problem. Pertinent negatives include no chest pain, headaches or shortness of breath. Treatments tried: losartan, metoprolol.   Essential hypertension  Type 2 diabetes mellitus with vascular disease ([place])  Encounter for immunization - Plan: Pneumococcal polysaccharide vaccine 23-valent greater than or equal to 2yo subcutaneous/IM  Patient does have some significant back pain and discomfort sees pain management he is on 2 hydrocodone's a day but he states he averages 1/day typically a half in the morning and half in the middle of the day He also takes Xanax twice a day we talked about the safety of this it would be best for the patient to be off of the Xanax with the patient states feels he benefits from this medication so therefore we talked about being on a lower dose to try to be safer.  We also talked about not taking it at the same time his pain medicine and never driving if he is feeling drowsy     Objective:   Physical Exam Vitals reviewed.  Cardiovascular:     Rate and Rhythm: Normal rate and regular rhythm.     Heart sounds: Normal heart sounds. No murmur heard.   Pulmonary:     Effort: Pulmonary effort is normal.     Breath sounds: Normal breath sounds.  Lymphadenopathy:     Cervical: No cervical adenopathy.  Neurological:     Mental Status: He is alert.  Psychiatric:        Behavior: Behavior normal.     Fall Risk  08/21/2020 03/26/2020 02/15/2019 07/06/2017 02/26/2016  Falls in the past year? 1 0 0 No Yes  Number falls in past yr: 0 - - - 2 or more  Injury with Fall? 0 - - - No  Risk for fall due to : - - - - Medication side effect  Follow up Falls evaluation completed Falls evaluation completed Falls evaluation completed - Falls evaluation completed   Lab work from the hospital was reviewed      Assessment & Plan:  1. Essential  hypertension Blood pressure under good control patient is encouraged to exercise such as walking on a regular basis watch diet and continue medication  2. Type 2 diabetes mellitus with vascular disease ([place]) His diabetes is followed by endocrinology.  They work hard with him on this he states his A1c's are improving we also reminded him to do yearly eye exams and have report sent to [place]  3. Encounter for immunization Pneumonia booster today - Pneumococcal polysaccharide vaccine 23-valent greater than or equal to 2yo subcutaneous/IM  Chronic pain management via pain management doctor Xanax reduce the dose to be safer.  We did talk about trying to be off of Xanax he does not feel he can do that currently I would encourage him to reduce his Xanax to half tablet twice a day patient will follow-up again in 4 to 6 months for evaluation as well as wellness  25 minutes spent with patient discussing these matters and reviewing over how things are going overall for the patient.

## 2020-08-21 NOTE — Patient Instructions (Signed)
As we discussed Xanax reduce this to 1/2 tablet twice a day Recheck in 4 to 6 months

## 2020-08-25 DIAGNOSIS — M791 Myalgia, unspecified site: Secondary | ICD-10-CM | POA: Diagnosis not present

## 2020-08-25 DIAGNOSIS — M542 Cervicalgia: Secondary | ICD-10-CM | POA: Diagnosis not present

## 2020-08-25 DIAGNOSIS — G894 Chronic pain syndrome: Secondary | ICD-10-CM | POA: Diagnosis not present

## 2020-08-25 DIAGNOSIS — Z6833 Body mass index (BMI) 33.0-33.9, adult: Secondary | ICD-10-CM | POA: Diagnosis not present

## 2020-09-09 ENCOUNTER — Other Ambulatory Visit: Payer: Self-pay | Admitting: Family Medicine

## 2020-09-10 ENCOUNTER — Telehealth: Payer: Self-pay | Admitting: Family Medicine

## 2020-09-10 MED ORDER — TAMSULOSIN HCL 0.4 MG PO CAPS: ORAL_CAPSULE | ORAL | 5 refills | Status: DC

## 2020-09-10 NOTE — Telephone Encounter (Signed)
May do tamsulosin 0.4 mg 1 nightly #30 with 5 refills If it causes significant dizziness at nighttime stop medicine

## 2020-09-10 NOTE — Telephone Encounter (Signed)
Patient is requesting refill on tamsulosin .4 mg called into [facility] completely out

## 2020-09-10 NOTE — Telephone Encounter (Signed)
Medication sent to pharmacy and patient is aware  

## 2020-09-10 NOTE — Telephone Encounter (Signed)
Not on current med list. Please advise. Thank you

## 2020-09-24 ENCOUNTER — Other Ambulatory Visit: Payer: Self-pay | Admitting: Family Medicine

## 2020-09-24 NOTE — Telephone Encounter (Signed)
Seen 08/21/20

## 2020-10-01 ENCOUNTER — Other Ambulatory Visit: Payer: Self-pay | Admitting: Family Medicine

## 2020-10-02 ENCOUNTER — Ambulatory Visit (INDEPENDENT_AMBULATORY_CARE_PROVIDER_SITE_OTHER): Payer: Medicare Other

## 2020-10-02 DIAGNOSIS — I255 Ischemic cardiomyopathy: Secondary | ICD-10-CM | POA: Diagnosis not present

## 2020-10-02 LAB — CUP PACEART REMOTE DEVICE CHECK
Battery Remaining Longevity: 12 mo
Battery Remaining Percentage: 13 %
Battery Voltage: 2.71 V
Brady Statistic AP VP Percent: 1 %
Brady Statistic AP VS Percent: 1 %
Brady Statistic AS VP Percent: 1 %
Brady Statistic AS VS Percent: 99 %
Brady Statistic RA Percent Paced: 1 %
Brady Statistic RV Percent Paced: 1 %
Date Time Interrogation Session: 20220602040018
HighPow Impedance: 54 Ohm
HighPow Impedance: 54 Ohm
Implantable Lead Implant Date: 20050706
Implantable Lead Implant Date: 20161128
Implantable Lead Location: 753859
Implantable Lead Location: 753860
Implantable Lead Model: 7122
Implantable Pulse Generator Implant Date: 20130727
Lead Channel Impedance Value: 360 Ohm
Lead Channel Impedance Value: 400 Ohm
Lead Channel Pacing Threshold Amplitude: 0.75 V
Lead Channel Pacing Threshold Amplitude: 1 V
Lead Channel Pacing Threshold Pulse Width: 0.5 ms
Lead Channel Pacing Threshold Pulse Width: 0.5 ms
Lead Channel Sensing Intrinsic Amplitude: 2.1 mV
Lead Channel Sensing Intrinsic Amplitude: 7.9 mV
Lead Channel Setting Pacing Amplitude: 2 V
Lead Channel Setting Pacing Amplitude: 2.5 V
Lead Channel Setting Pacing Pulse Width: 0.5 ms
Lead Channel Setting Sensing Sensitivity: 0.5 mV
Pulse Gen Serial Number: 7041246

## 2020-10-07 ENCOUNTER — Emergency Department (HOSPITAL_COMMUNITY): Payer: Medicare Other

## 2020-10-07 ENCOUNTER — Other Ambulatory Visit: Payer: Self-pay

## 2020-10-07 ENCOUNTER — Encounter (HOSPITAL_COMMUNITY): Payer: Self-pay | Admitting: Emergency Medicine

## 2020-10-07 ENCOUNTER — Telehealth: Payer: Self-pay | Admitting: Family Medicine

## 2020-10-07 ENCOUNTER — Emergency Department (HOSPITAL_COMMUNITY)
Admission: EM | Admit: 2020-10-07 | Discharge: 2020-10-07 | Discharge status: Against Medical Advice | Payer: Medicare Other | Attending: Emergency Medicine | Admitting: Emergency Medicine

## 2020-10-07 DIAGNOSIS — G894 Chronic pain syndrome: Secondary | ICD-10-CM | POA: Diagnosis not present

## 2020-10-07 DIAGNOSIS — M791 Myalgia, unspecified site: Secondary | ICD-10-CM | POA: Diagnosis not present

## 2020-10-07 DIAGNOSIS — M542 Cervicalgia: Secondary | ICD-10-CM | POA: Diagnosis not present

## 2020-10-07 DIAGNOSIS — R519 Headache, unspecified: Secondary | ICD-10-CM | POA: Diagnosis not present

## 2020-10-07 DIAGNOSIS — Z5321 Procedure and treatment not carried out due to patient leaving prior to being seen by health care provider: Secondary | ICD-10-CM | POA: Diagnosis not present

## 2020-10-07 NOTE — ED Triage Notes (Signed)
Patient reports worsening posterior neck pain with headache onset last Thursday , denies injury or fall , no fever or chills .

## 2020-10-07 NOTE — ED Notes (Signed)
I went to check the pt's vitals and he was getting up. He stated he was going to leave and make an appointment with his DR. Moving pt OTF.

## 2020-10-07 NOTE — Telephone Encounter (Signed)
Left message for patient to call back and schedule Medicare Annual Wellness Visit (AWV).   Please offer to do virtually or by telephone.   Due for AWVI  Please schedule at anytime with Nurse Health Advisor.   

## 2020-10-07 NOTE — ED Provider Notes (Signed)
Emergency Medicine Provider Triage Evaluation Note  Lance [name] , a 75 y.o. male  was evaluated in triage.  Pt complains of neck pain.  The patient has been having constant, with worsening neck pain for the last week.  Pain began suddenly when he bent down to get in the car and hit the back of his neck on the car.  He has been taking his home pain medication without improvement.  Pain is worse when he attempts to move his neck.  The pain radiates up into his head.  He does have a history of bilateral neck pain with the same quality of the pain that he has been having for the last week.  He has a history of neck surgeries x2.  He denies new numbness, weakness, chest pain, shortness of breath, visual changes.  Review of Systems  Positive: Neck pain, headache Negative: Chest pain, shortness of breath, visual changes, numbness, weakness  Physical Exam  There were no vitals taken for this visit. Gen:   Awake, no distress   Resp:  Normal effort  MSK:   Moves extremities without difficulty  Other:  Midline tenderness to palpation to the cervical spine.  Good grip strength bilaterally.  Sensation is intact to the bilateral upper and lower extremities.  No crepitus or step-offs noted to the cervical spine.  Thoracic and lumbar spine are nontender.  Medical Decision Making  Medically screening exam initiated at 2:24 AM.  Appropriate orders placed.  [name] was informed that the remainder of the evaluation will be completed by another provider, this initial triage assessment does not replace that evaluation, and the importance of remaining in the ED until their evaluation is complete.  Patient's work-up has been initiated in the emergency department.  Patient declined pain medication at this time.   Joanne Gavel, PA-C 10/07/20 [id number]    Merryl Hacker, MD 10/07/20 [phone]

## 2020-10-10 DIAGNOSIS — B351 Tinea unguium: Secondary | ICD-10-CM | POA: Diagnosis not present

## 2020-10-10 DIAGNOSIS — S0502XA Injury of conjunctiva and corneal abrasion without foreign body, left eye, initial encounter: Secondary | ICD-10-CM | POA: Diagnosis not present

## 2020-10-10 DIAGNOSIS — E1142 Type 2 diabetes mellitus with diabetic polyneuropathy: Secondary | ICD-10-CM | POA: Diagnosis not present

## 2020-10-16 DIAGNOSIS — S0502XD Injury of conjunctiva and corneal abrasion without foreign body, left eye, subsequent encounter: Secondary | ICD-10-CM | POA: Diagnosis not present

## 2020-10-27 NOTE — Progress Notes (Signed)
Remote ICD transmission.   

## 2020-10-28 ENCOUNTER — Ambulatory Visit: Payer: Medicare Other | Admitting: Cardiology

## 2020-11-04 ENCOUNTER — Ambulatory Visit (INDEPENDENT_AMBULATORY_CARE_PROVIDER_SITE_OTHER): Payer: Medicare Other

## 2020-11-04 ENCOUNTER — Other Ambulatory Visit: Payer: Self-pay

## 2020-11-04 DIAGNOSIS — Z0000 Encounter for general adult medical examination without abnormal findings: Secondary | ICD-10-CM | POA: Diagnosis not present

## 2020-11-04 NOTE — Patient Instructions (Signed)
Lance [name] , Thank you for taking time to come for your Medicare Wellness Visit. I appreciate your ongoing commitment to your health goals. Please review the following plan we discussed and let me know if I can assist you in the future.   Screening recommendations/referrals: Colonoscopy: Up to date, next due 05/10/2021 Recommended yearly ophthalmology/optometry visit for glaucoma screening and checkup Recommended yearly dental visit for hygiene and checkup  Vaccinations: Influenza vaccine: Up to date, next due fall 2022  Pneumococcal vaccine: Completed series  Tdap vaccine: Up to date, next due 09/15/2024 Shingles vaccine: Currently due for Shingrix, if you would like to receive we recommend that you do so at your local pharmacy.     Advanced directives: Please bring in copies of your advanced medical directives so that we may scan into your chart.  Conditions/risks identified: None   Next appointment: None   Preventive Care 65 Years and Older, Male Preventive care refers to lifestyle choices and visits with your health care provider that can promote health and wellness. What does preventive care include? A yearly physical exam. This is also called an annual well check. Dental exams once or twice a year. Routine eye exams. Ask your health care provider how often you should have your eyes checked. Personal lifestyle choices, including: Daily care of your teeth and gums. Regular physical activity. Eating a healthy diet. Avoiding tobacco and drug use. Limiting alcohol use. Practicing safe sex. Taking low doses of aspirin every day. Taking vitamin and mineral supplements as recommended by your health care provider. What happens during an annual well check? The services and screenings done by your health care provider during your annual well check will depend on your age, overall health, lifestyle risk factors, and family history of disease. Counseling  Your health care provider may  ask you questions about your: Alcohol use. Tobacco use. Drug use. Emotional well-being. Home and relationship well-being. Sexual activity. Eating habits. History of falls. Memory and ability to understand (cognition). Work and work Statistician. Screening  You may have the following tests or measurements: Height, weight, and BMI. Blood pressure. Lipid and cholesterol levels. These may be checked every 5 years, or more frequently if you are over 31 years old. Skin check. Lung cancer screening. You may have this screening every year starting at age 62 if you have a 30-pack-year history of smoking and currently smoke or have quit within the past 15 years. Fecal occult blood test (FOBT) of the stool. You may have this test every year starting at age 38. Flexible sigmoidoscopy or colonoscopy. You may have a sigmoidoscopy every 5 years or a colonoscopy every 10 years starting at age 55. Prostate cancer screening. Recommendations will vary depending on your family history and other risks. Hepatitis C blood test. Hepatitis B blood test. Sexually transmitted disease (STD) testing. Diabetes screening. This is done by checking your blood sugar (glucose) after you have not eaten for a while (fasting). You may have this done every 1-3 years. Abdominal aortic aneurysm (AAA) screening. You may need this if you are a current or former smoker. Osteoporosis. You may be screened starting at age 36 if you are at high risk. Talk with your health care provider about your test results, treatment options, and if necessary, the need for more tests. Vaccines  Your health care provider may recommend certain vaccines, such as: Influenza vaccine. This is recommended every year. Tetanus, diphtheria, and acellular pertussis (Tdap, Td) vaccine. You may need a Td booster every 10  years. Zoster vaccine. You may need this after age 61. Pneumococcal 13-valent conjugate (PCV13) vaccine. One dose is recommended after age  75. Pneumococcal polysaccharide (PPSV23) vaccine. One dose is recommended after age 63. Talk to your health care provider about which screenings and vaccines you need and how often you need them. This information is not intended to replace advice given to you by your health care provider. Make sure you discuss any questions you have with your health care provider. Document Released: 05/16/2015 Document Revised: 01/07/2016 Document Reviewed: 02/18/2015 Elsevier Interactive Patient Education  2017 [place] Prevention in the Home Falls can cause injuries. They can happen to people of all ages. There are many things you can do to make your home safe and to help prevent falls. What can I do on the outside of my home? Regularly fix the edges of walkways and driveways and fix any cracks. Remove anything that might make you trip as you walk through a door, such as a raised step or threshold. Trim any bushes or trees on the path to your home. Use bright outdoor lighting. Clear any walking paths of anything that might make someone trip, such as rocks or tools. Regularly check to see if handrails are loose or broken. Make sure that both sides of any steps have handrails. Any raised decks and porches should have guardrails on the edges. Have any leaves, snow, or ice cleared regularly. Use sand or salt on walking paths during winter. Clean up any spills in your garage right away. This includes oil or grease spills. What can I do in the bathroom? Use night lights. Install grab bars by the toilet and in the tub and shower. Do not use towel bars as grab bars. Use non-skid mats or decals in the tub or shower. If you need to sit down in the shower, use a plastic, non-slip stool. Keep the floor dry. Clean up any water that spills on the floor as soon as it happens. Remove soap buildup in the tub or shower regularly. Attach bath mats securely with double-sided non-slip rug tape. Do not have throw  rugs and other things on the floor that can make you trip. What can I do in the bedroom? Use night lights. Make sure that you have a light by your bed that is easy to reach. Do not use any sheets or blankets that are too big for your bed. They should not hang down onto the floor. Have a firm chair that has side arms. You can use this for support while you get dressed. Do not have throw rugs and other things on the floor that can make you trip. What can I do in the kitchen? Clean up any spills right away. Avoid walking on wet floors. Keep items that you use a lot in easy-to-reach places. If you need to reach something above you, use a strong step stool that has a grab bar. Keep electrical cords out of the way. Do not use floor polish or wax that makes floors slippery. If you must use wax, use non-skid floor wax. Do not have throw rugs and other things on the floor that can make you trip. What can I do with my stairs? Do not leave any items on the stairs. Make sure that there are handrails on both sides of the stairs and use them. Fix handrails that are broken or loose. Make sure that handrails are as long as the stairways. Check any carpeting to make sure  that it is firmly attached to the stairs. Fix any carpet that is loose or worn. Avoid having throw rugs at the top or bottom of the stairs. If you do have throw rugs, attach them to the floor with carpet tape. Make sure that you have a light switch at the top of the stairs and the bottom of the stairs. If you do not have them, ask someone to add them for you. What else can I do to help prevent falls? Wear shoes that: Do not have high heels. Have rubber bottoms. Are comfortable and fit you well. Are closed at the toe. Do not wear sandals. If you use a stepladder: Make sure that it is fully opened. Do not climb a closed stepladder. Make sure that both sides of the stepladder are locked into place. Ask someone to hold it for you, if  possible. Clearly mark and make sure that you can see: Any grab bars or handrails. First and last steps. Where the edge of each step is. Use tools that help you move around (mobility aids) if they are needed. These include: Canes. Walkers. Scooters. Crutches. Turn on the lights when you go into a dark area. Replace any light bulbs as soon as they burn out. Set up your furniture so you have a clear path. Avoid moving your furniture around. If any of your floors are uneven, fix them. If there are any pets around you, be aware of where they are. Review your medicines with your doctor. Some medicines can make you feel dizzy. This can increase your chance of falling. Ask your doctor what other things that you can do to help prevent falls. This information is not intended to replace advice given to you by your health care provider. Make sure you discuss any questions you have with your health care provider. Document Released: 02/13/2009 Document Revised: 09/25/2015 Document Reviewed: 05/24/2014 Elsevier Interactive Patient Education  2017 [employer].
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
## 2020-11-11 ENCOUNTER — Other Ambulatory Visit: Payer: Self-pay | Admitting: Cardiology
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
## 2020-11-18 DIAGNOSIS — E113593 Type 2 diabetes mellitus with proliferative diabetic retinopathy without macular edema, bilateral: Secondary | ICD-10-CM | POA: Diagnosis not present

## 2020-11-19 ENCOUNTER — Encounter: Payer: Self-pay | Admitting: Gastroenterology

## 2020-11-19 ENCOUNTER — Other Ambulatory Visit: Payer: Self-pay

## 2020-11-19 ENCOUNTER — Ambulatory Visit: Payer: Medicare Other | Admitting: Nurse Practitioner

## 2020-11-19 ENCOUNTER — Encounter: Payer: Self-pay | Admitting: Internal Medicine

## 2020-11-19 ENCOUNTER — Ambulatory Visit (INDEPENDENT_AMBULATORY_CARE_PROVIDER_SITE_OTHER): Payer: Medicare Other | Admitting: Gastroenterology

## 2020-11-19 VITALS — BP 126/82 | HR 65 | Temp 97.3°F | Ht 69.0 in | Wt 216.0 lb

## 2020-11-19 DIAGNOSIS — K219 Gastro-esophageal reflux disease without esophagitis: Secondary | ICD-10-CM | POA: Diagnosis not present

## 2020-11-19 DIAGNOSIS — K625 Hemorrhage of anus and rectum: Secondary | ICD-10-CM | POA: Diagnosis not present

## 2020-11-19 DIAGNOSIS — I255 Ischemic cardiomyopathy: Secondary | ICD-10-CM

## 2020-11-19 DIAGNOSIS — K5900 Constipation, unspecified: Secondary | ICD-10-CM | POA: Diagnosis not present

## 2020-11-19 NOTE — Patient Instructions (Signed)
Continue Protonix 40 mg twice daily 30 minutes before breakfast and dinner.   Follow a GERD diet:  Avoid fried, fatty, greasy, spicy, citrus foods. Avoid caffeine and carbonated beverages. Avoid chocolate. Try eating 4-6 small meals a day rather than 3 large meals. Do not eat within 3 hours of laying down. Prop head of bed up on wood or bricks to create a 6 inch incline.  For constipation:  Start colace 1-2 daily (100-200 mg total daily).  If you skip a couple days between bowel movements you may use MiraLAX 1 capful (17 g) in 8 oz of water.   We will follow-up in 6 months. Call sooner if needed.   It was great to meet you today.   Aliene Altes, PA-C [facility]

## 2020-11-21 DIAGNOSIS — M47892 Other spondylosis, cervical region: Secondary | ICD-10-CM | POA: Diagnosis not present

## 2020-11-21 DIAGNOSIS — G894 Chronic pain syndrome: Secondary | ICD-10-CM | POA: Diagnosis not present

## 2020-11-21 DIAGNOSIS — Z79891 Long term (current) use of opiate analgesic: Secondary | ICD-10-CM | POA: Diagnosis not present

## 2020-11-21 DIAGNOSIS — M542 Cervicalgia: Secondary | ICD-10-CM | POA: Diagnosis not present

## 2020-11-21 DIAGNOSIS — Z79899 Other long term (current) drug therapy: Secondary | ICD-10-CM | POA: Diagnosis not present

## 2020-11-24 DIAGNOSIS — I2510 Atherosclerotic heart disease of native coronary artery without angina pectoris: Secondary | ICD-10-CM | POA: Diagnosis not present

## 2020-11-24 DIAGNOSIS — E1142 Type 2 diabetes mellitus with diabetic polyneuropathy: Secondary | ICD-10-CM | POA: Diagnosis not present

## 2020-11-24 DIAGNOSIS — E1165 Type 2 diabetes mellitus with hyperglycemia: Secondary | ICD-10-CM | POA: Diagnosis not present

## 2020-11-24 DIAGNOSIS — Z794 Long term (current) use of insulin: Secondary | ICD-10-CM | POA: Diagnosis not present

## 2020-11-26 DIAGNOSIS — M25562 Pain in left knee: Secondary | ICD-10-CM | POA: Diagnosis not present

## 2020-11-26 DIAGNOSIS — M25532 Pain in left wrist: Secondary | ICD-10-CM | POA: Diagnosis not present

## 2020-12-19 DIAGNOSIS — E1142 Type 2 diabetes mellitus with diabetic polyneuropathy: Secondary | ICD-10-CM | POA: Diagnosis not present

## 2020-12-19 DIAGNOSIS — B351 Tinea unguium: Secondary | ICD-10-CM | POA: Diagnosis not present

## 2020-12-23 ENCOUNTER — Other Ambulatory Visit: Payer: Self-pay | Admitting: Internal Medicine

## 2020-12-23 DIAGNOSIS — K219 Gastro-esophageal reflux disease without esophagitis: Secondary | ICD-10-CM

## 2020-12-23 DIAGNOSIS — R1319 Other dysphagia: Secondary | ICD-10-CM

## 2021-01-01 ENCOUNTER — Ambulatory Visit (INDEPENDENT_AMBULATORY_CARE_PROVIDER_SITE_OTHER): Payer: Medicare Other

## 2021-01-01 DIAGNOSIS — I255 Ischemic cardiomyopathy: Secondary | ICD-10-CM | POA: Diagnosis not present

## 2021-01-02 ENCOUNTER — Other Ambulatory Visit: Payer: Self-pay | Admitting: Family Medicine

## 2021-01-06 LAB — CUP PACEART REMOTE DEVICE CHECK
Battery Remaining Longevity: 9 mo
Battery Remaining Percentage: 9 %
Battery Voltage: 2.66 V
Brady Statistic AP VP Percent: 1 %
Brady Statistic AP VS Percent: 1 %
Brady Statistic AS VP Percent: 1 %
Brady Statistic AS VS Percent: 99 %
Brady Statistic RA Percent Paced: 1 %
Brady Statistic RV Percent Paced: 1 %
Date Time Interrogation Session: 20220901040021
HighPow Impedance: 55 Ohm
HighPow Impedance: 55 Ohm
Implantable Lead Implant Date: 20050706
Implantable Lead Implant Date: 20161128
Implantable Lead Location: 753859
Implantable Lead Location: 753860
Implantable Lead Model: 7122
Implantable Pulse Generator Implant Date: 20130727
Lead Channel Impedance Value: 380 Ohm
Lead Channel Impedance Value: 410 Ohm
Lead Channel Pacing Threshold Amplitude: 0.75 V
Lead Channel Pacing Threshold Amplitude: 1 V
Lead Channel Pacing Threshold Pulse Width: 0.5 ms
Lead Channel Pacing Threshold Pulse Width: 0.5 ms
Lead Channel Sensing Intrinsic Amplitude: 2.3 mV
Lead Channel Sensing Intrinsic Amplitude: 9.3 mV
Lead Channel Setting Pacing Amplitude: 2 V
Lead Channel Setting Pacing Amplitude: 2.5 V
Lead Channel Setting Pacing Pulse Width: 0.5 ms
Lead Channel Setting Sensing Sensitivity: 0.5 mV
Pulse Gen Serial Number: 7041246

## 2021-01-09 DIAGNOSIS — G5602 Carpal tunnel syndrome, left upper limb: Secondary | ICD-10-CM | POA: Diagnosis not present

## 2021-01-09 DIAGNOSIS — G5601 Carpal tunnel syndrome, right upper limb: Secondary | ICD-10-CM | POA: Diagnosis not present

## 2021-01-09 DIAGNOSIS — G5603 Carpal tunnel syndrome, bilateral upper limbs: Secondary | ICD-10-CM | POA: Diagnosis not present

## 2021-01-13 NOTE — Progress Notes (Signed)
Remote ICD transmission.   

## 2021-01-21 ENCOUNTER — Other Ambulatory Visit: Payer: Self-pay | Admitting: Family Medicine

## 2021-01-28 ENCOUNTER — Other Ambulatory Visit: Payer: Self-pay | Admitting: Family Medicine

## 2021-01-28 DIAGNOSIS — G894 Chronic pain syndrome: Secondary | ICD-10-CM | POA: Diagnosis not present

## 2021-01-28 DIAGNOSIS — M542 Cervicalgia: Secondary | ICD-10-CM | POA: Diagnosis not present

## 2021-01-28 DIAGNOSIS — M47892 Other spondylosis, cervical region: Secondary | ICD-10-CM | POA: Diagnosis not present

## 2021-02-05 ENCOUNTER — Other Ambulatory Visit: Payer: Self-pay

## 2021-02-05 ENCOUNTER — Ambulatory Visit (INDEPENDENT_AMBULATORY_CARE_PROVIDER_SITE_OTHER): Payer: Medicare Other | Admitting: Family Medicine

## 2021-02-05 ENCOUNTER — Encounter: Payer: Self-pay | Admitting: Family Medicine

## 2021-02-05 VITALS — BP 133/74 | HR 62 | Temp 97.2°F | Ht 69.0 in | Wt 213.0 lb

## 2021-02-05 DIAGNOSIS — R3914 Feeling of incomplete bladder emptying: Secondary | ICD-10-CM | POA: Diagnosis not present

## 2021-02-05 DIAGNOSIS — Z23 Encounter for immunization: Secondary | ICD-10-CM | POA: Diagnosis not present

## 2021-02-05 DIAGNOSIS — N401 Enlarged prostate with lower urinary tract symptoms: Secondary | ICD-10-CM | POA: Diagnosis not present

## 2021-02-05 DIAGNOSIS — I255 Ischemic cardiomyopathy: Secondary | ICD-10-CM

## 2021-02-05 MED ORDER — CEFDINIR 300 MG PO CAPS: 300.0000 mg | ORAL_CAPSULE | Freq: Two times a day (BID) | ORAL | 0 refills | Status: DC

## 2021-02-05 NOTE — Progress Notes (Signed)
   Subjective:    Patient ID: Lance [name], male    DOB: November 10, 1945, 75 y.o.   MRN: [id number]  HPI Urine flow concerns, incomplete emptying worsened in 3 weeks  Patient denies hematuria dysuria abdominal pain rectal pain. Does relate he at times feels dizzy when he stands up already on tamsulosin  Review of Systems     Objective:   Physical Exam  General-in no acute distress Eyes-no discharge Lungs-respiratory rate normal, CTA CV-no murmurs,RRR Extremities skin warm dry no edema Neuro grossly normal Behavior normal, alert  Prostate enlarged but soft     Assessment & Plan:   Flu shot today BPH with urinary frequency referral to urology Warning signs discussed continue Flomax Cannot go up on the dose because his blood pressure drops when he stands

## 2021-02-06 DIAGNOSIS — G5601 Carpal tunnel syndrome, right upper limb: Secondary | ICD-10-CM | POA: Diagnosis not present

## 2021-02-06 DIAGNOSIS — G5603 Carpal tunnel syndrome, bilateral upper limbs: Secondary | ICD-10-CM | POA: Diagnosis not present

## 2021-02-06 DIAGNOSIS — G5602 Carpal tunnel syndrome, left upper limb: Secondary | ICD-10-CM | POA: Diagnosis not present

## 2021-02-20 ENCOUNTER — Encounter: Payer: Self-pay | Admitting: Urology

## 2021-02-20 ENCOUNTER — Ambulatory Visit (INDEPENDENT_AMBULATORY_CARE_PROVIDER_SITE_OTHER): Payer: Medicare Other | Admitting: Urology

## 2021-02-20 ENCOUNTER — Other Ambulatory Visit: Payer: Self-pay

## 2021-02-20 VITALS — BP 149/75 | HR 67

## 2021-02-20 DIAGNOSIS — R3914 Feeling of incomplete bladder emptying: Secondary | ICD-10-CM | POA: Diagnosis not present

## 2021-02-20 DIAGNOSIS — N401 Enlarged prostate with lower urinary tract symptoms: Secondary | ICD-10-CM

## 2021-02-20 DIAGNOSIS — R350 Frequency of micturition: Secondary | ICD-10-CM

## 2021-02-20 LAB — URINALYSIS, ROUTINE W REFLEX MICROSCOPIC
Bilirubin, UA: NEGATIVE
Ketones, UA: NEGATIVE
Leukocytes,UA: NEGATIVE
Nitrite, UA: NEGATIVE
Protein,UA: NEGATIVE
RBC, UA: NEGATIVE
Specific Gravity, UA: 1.02 (ref 1.005–1.030)
Urobilinogen, Ur: 0.2 mg/dL (ref 0.2–1.0)
pH, UA: 6 (ref 5.0–7.5)

## 2021-02-20 MED ORDER — MIRABEGRON ER 25 MG PO TB24: 25.0000 mg | ORAL_TABLET | Freq: Every day | ORAL | 0 refills | Status: DC

## 2021-02-20 NOTE — Progress Notes (Signed)
post void residual = 0 ml

## 2021-02-20 NOTE — Progress Notes (Signed)
Urological Symptom Review  Patient is experiencing the following symptoms: Hard to postpone urination Get up at night to urinate Stream starts and stops   Review of Systems  Gastrointestinal (upper)  : Negative for upper GI symptoms  Gastrointestinal (lower) : Negative for lower GI symptoms  Constitutional : Negative for symptoms  Skin: Negative for skin symptoms  Eyes: Negative for eye symptoms  Ear/Nose/Throat : Negative for Ear/Nose/Throat symptoms  Hematologic/Lymphatic: Negative for Hematologic/Lymphatic symptoms  Cardiovascular : Negative for cardiovascular symptoms  Respiratory : Cough  Endocrine: Excessive thirst  Musculoskeletal: Back pain Joint pain  Neurological: Dizziness  Psychologic: Negative for psychiatric symptoms
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
## 2021-02-21 LAB — PSA: Prostate Specific Ag, Serum: 1.4 ng/mL (ref 0.0–4.0)

## 2021-02-24 ENCOUNTER — Other Ambulatory Visit: Payer: Self-pay | Admitting: Family Medicine

## 2021-02-27 ENCOUNTER — Encounter: Payer: Self-pay | Admitting: Cardiology

## 2021-02-27 ENCOUNTER — Other Ambulatory Visit: Payer: Self-pay

## 2021-02-27 ENCOUNTER — Ambulatory Visit (INDEPENDENT_AMBULATORY_CARE_PROVIDER_SITE_OTHER): Payer: Medicare Other | Admitting: Cardiology

## 2021-02-27 VITALS — BP 142/70 | HR 69 | Ht 69.0 in | Wt 224.0 lb

## 2021-02-27 DIAGNOSIS — I10 Essential (primary) hypertension: Secondary | ICD-10-CM | POA: Diagnosis not present

## 2021-02-27 DIAGNOSIS — I255 Ischemic cardiomyopathy: Secondary | ICD-10-CM

## 2021-02-27 DIAGNOSIS — E1142 Type 2 diabetes mellitus with diabetic polyneuropathy: Secondary | ICD-10-CM | POA: Diagnosis not present

## 2021-02-27 DIAGNOSIS — E782 Mixed hyperlipidemia: Secondary | ICD-10-CM

## 2021-02-27 DIAGNOSIS — I2510 Atherosclerotic heart disease of native coronary artery without angina pectoris: Secondary | ICD-10-CM

## 2021-02-27 DIAGNOSIS — B351 Tinea unguium: Secondary | ICD-10-CM | POA: Diagnosis not present
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
## 2021-02-27 NOTE — Patient Instructions (Signed)
Medication Instructions:  °Your physician recommends that you continue on your current medications as directed. Please refer to the Current Medication list given to you today. ° °*If you need a refill on your cardiac medications before your next appointment, please call your pharmacy* ° ° °Lab Work: °None °If you have labs (blood work) drawn today and your tests are completely normal, you will receive your results only by: °MyChart Message (if you have MyChart) OR °A paper copy in the mail °If you have any lab test that is abnormal or we need to change your treatment, we will call you to review the results. ° ° °Testing/Procedures: °None ° ° °Follow-Up: °At [facility], you and your health needs are our priority.  As part of our continuing mission to provide you with exceptional heart care, we have created designated Provider Care Teams.  These Care Teams include your primary Cardiologist (physician) and Advanced Practice Providers (APPs -  Physician Assistants and Nurse Practitioners) who all work together to provide you with the care you need, when you need it. ° °We recommend signing up for the patient portal called "MyChart".  Sign up information is provided on this After Visit Summary.  MyChart is used to connect with patients for Virtual Visits (Telemedicine).  Patients are able to view lab/test results, encounter notes, upcoming appointments, etc.  Non-urgent messages can be sent to your provider as well.   °To learn more about what you can do with MyChart, go to [email or web address].   ° °Your next appointment:   °6 month(s) ° °The format for your next appointment:   °In Person ° °Provider:   °Jonathan Branch, MD  ° ° °Other Instructions ° ° ° °

## 2021-03-06 DIAGNOSIS — G5603 Carpal tunnel syndrome, bilateral upper limbs: Secondary | ICD-10-CM | POA: Diagnosis not present

## 2021-03-12 ENCOUNTER — Other Ambulatory Visit: Payer: Self-pay | Admitting: Family Medicine

## 2021-03-25 ENCOUNTER — Ambulatory Visit (INDEPENDENT_AMBULATORY_CARE_PROVIDER_SITE_OTHER): Payer: Medicare Other | Admitting: Urology

## 2021-03-25 ENCOUNTER — Other Ambulatory Visit: Payer: Self-pay

## 2021-03-25 ENCOUNTER — Encounter: Payer: Self-pay | Admitting: Urology

## 2021-03-25 VITALS — BP 134/80 | HR 66 | Wt 219.2 lb

## 2021-03-25 DIAGNOSIS — N401 Enlarged prostate with lower urinary tract symptoms: Secondary | ICD-10-CM | POA: Diagnosis not present

## 2021-03-25 DIAGNOSIS — N529 Male erectile dysfunction, unspecified: Secondary | ICD-10-CM

## 2021-03-25 DIAGNOSIS — R350 Frequency of micturition: Secondary | ICD-10-CM

## 2021-03-25 LAB — URINALYSIS, ROUTINE W REFLEX MICROSCOPIC
Bilirubin, UA: NEGATIVE
Ketones, UA: NEGATIVE
Leukocytes,UA: NEGATIVE
Nitrite, UA: NEGATIVE
Protein,UA: NEGATIVE
RBC, UA: NEGATIVE
Specific Gravity, UA: 1.01 (ref 1.005–1.030)
Urobilinogen, Ur: 0.2 mg/dL (ref 0.2–1.0)
pH, UA: 6 (ref 5.0–7.5)

## 2021-03-25 MED ORDER — MIRABEGRON ER 25 MG PO TB24: 25.0000 mg | ORAL_TABLET | Freq: Every day | ORAL | 11 refills | Status: DC

## 2021-03-25 MED ORDER — SILDENAFIL CITRATE 20 MG PO TABS: ORAL_TABLET | ORAL | 11 refills | Status: DC
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
## 2021-03-30 DIAGNOSIS — G894 Chronic pain syndrome: Secondary | ICD-10-CM | POA: Diagnosis not present

## 2021-03-30 DIAGNOSIS — M542 Cervicalgia: Secondary | ICD-10-CM | POA: Diagnosis not present

## 2021-03-30 DIAGNOSIS — M47892 Other spondylosis, cervical region: Secondary | ICD-10-CM | POA: Diagnosis not present

## 2021-04-02 ENCOUNTER — Ambulatory Visit (INDEPENDENT_AMBULATORY_CARE_PROVIDER_SITE_OTHER): Payer: Medicare Other

## 2021-04-02 ENCOUNTER — Other Ambulatory Visit: Payer: Self-pay | Admitting: Family Medicine

## 2021-04-02 DIAGNOSIS — I255 Ischemic cardiomyopathy: Secondary | ICD-10-CM | POA: Diagnosis not present

## 2021-04-02 NOTE — Telephone Encounter (Signed)
Pt wife called and stated her Alprazolam 0.5 ([place]) needed to be refilled. She stated she has been giving him a whole tablet x 2 daily.   CB# [phone]

## 2021-04-03 ENCOUNTER — Telehealth: Payer: Self-pay

## 2021-04-03 LAB — CUP PACEART REMOTE DEVICE CHECK
Battery Remaining Longevity: 6 mo
Battery Remaining Percentage: 6 %
Battery Voltage: 2.63 V
Brady Statistic AP VP Percent: 1 %
Brady Statistic AP VS Percent: 1 %
Brady Statistic AS VP Percent: 1 %
Brady Statistic AS VS Percent: 99 %
Brady Statistic RA Percent Paced: 1 %
Brady Statistic RV Percent Paced: 1 %
Date Time Interrogation Session: 20221201040016
HighPow Impedance: 56 Ohm
HighPow Impedance: 56 Ohm
Implantable Lead Implant Date: 20050706
Implantable Lead Implant Date: 20161128
Implantable Lead Location: 753859
Implantable Lead Location: 753860
Implantable Lead Model: 7122
Implantable Pulse Generator Implant Date: 20130727
Lead Channel Impedance Value: 360 Ohm
Lead Channel Impedance Value: 390 Ohm
Lead Channel Pacing Threshold Amplitude: 0.75 V
Lead Channel Pacing Threshold Amplitude: 1 V
Lead Channel Pacing Threshold Pulse Width: 0.5 ms
Lead Channel Pacing Threshold Pulse Width: 0.5 ms
Lead Channel Sensing Intrinsic Amplitude: 1.7 mV
Lead Channel Sensing Intrinsic Amplitude: 9.5 mV
Lead Channel Setting Pacing Amplitude: 2 V
Lead Channel Setting Pacing Amplitude: 2.5 V
Lead Channel Setting Pacing Pulse Width: 0.5 ms
Lead Channel Setting Sensing Sensitivity: 0.5 mV
Pulse Gen Serial Number: 7041246

## 2021-04-03 NOTE — Telephone Encounter (Signed)
Please advise. Thank you

## 2021-04-03 NOTE — Telephone Encounter (Signed)
Scheduled remote reviewed. Normal device function.  Est longevity 6 mo. Routing to triage for monthly remotes. LH  Successful telephone encounter to patient to discuss need for monthly remotes secondary to est longevity of 6 months. Patient is an automatic send. All questions answered to patients satisfaction. Monthly remotes scheduled in [employer] and [employer].

## 2021-04-06 ENCOUNTER — Ambulatory Visit (INDEPENDENT_AMBULATORY_CARE_PROVIDER_SITE_OTHER): Payer: Medicare Other | Admitting: Family Medicine

## 2021-04-06 ENCOUNTER — Encounter: Payer: Self-pay | Admitting: Family Medicine

## 2021-04-06 ENCOUNTER — Other Ambulatory Visit: Payer: Self-pay | Admitting: Family Medicine

## 2021-04-06 ENCOUNTER — Other Ambulatory Visit: Payer: Self-pay

## 2021-04-06 VITALS — BP 131/57 | Temp 97.0°F | Ht 69.0 in | Wt 217.4 lb

## 2021-04-06 DIAGNOSIS — I255 Ischemic cardiomyopathy: Secondary | ICD-10-CM | POA: Diagnosis not present

## 2021-04-06 DIAGNOSIS — J019 Acute sinusitis, unspecified: Secondary | ICD-10-CM | POA: Diagnosis not present

## 2021-04-06 MED ORDER — CEFPROZIL 500 MG PO TABS: 500.0000 mg | ORAL_TABLET | Freq: Two times a day (BID) | ORAL | 0 refills | Status: DC

## 2021-04-06 MED ORDER — ALPRAZOLAM 0.25 MG PO TABS: ORAL_TABLET | ORAL | 3 refills | Status: DC

## 2021-04-06 NOTE — Telephone Encounter (Signed)
So in a previous visit several months ago we instructed him that his new dosage would be half of a tablet twice daily. It is my understanding from this message stating that she has been giving him a whole tablet twice daily? If he is out of medicine currently then I would recommend we switch this to Xanax 0.25 mg 1 tablet twice daily #60-may have 3 refills-that way they do not have to split the 0.5 mg in half Please inform family, please pend the prescription then send back to me and I will sign off on it

## 2021-04-06 NOTE — Telephone Encounter (Signed)
Pt wife contacted and verbalized understanding. Xanax script pended. Please advise. Thank you

## 2021-04-06 NOTE — Progress Notes (Signed)
   Subjective:    Patient ID: [name], male    DOB: 1945-11-13, 75 y.o.   MRN: [id number]  HPI  Patient presents today with respiratory illness Number of days present-Friday 04/03/21  Symptoms include- phelgm, runny nose, headache,ear pain  Presence of worrisome signs (severe shortness of breath, lethargy, etc.) - shortness of breath  Recent/current visit to urgent care or ER- no  Recent direct exposure to Covid- none  Any current Covid testing- none  Head congestion drainage coughing sinus pressure not feeling good symptoms over the past several days no wheezing or difficulty breathing  Review of Systems     Objective:   Physical Exam  Gen-NAD not toxic TMS-normal bilateral T- normal no redness Chest-CTA respiratory rate normal no crackles CV RRR no murmur Skin-warm dry Neuro-grossly normal       Assessment & Plan:  Patient was seen today for upper respiratory illness. It is felt that the patient is dealing with sinusitis.  Antibiotics were prescribed today.  Compliance discussed.  If worsening symptoms or progressive illness notify us.  If emergency call 911 or go to ER. He does not appear toxic should get better with medication follow-up if ongoing troubles

## 2021-04-06 NOTE — Telephone Encounter (Signed)
This prescription was signed off on thank you

## 2021-04-14 NOTE — Progress Notes (Signed)
Remote ICD transmission.   

## 2021-04-16 ENCOUNTER — Other Ambulatory Visit: Payer: Self-pay | Admitting: Family Medicine

## 2021-04-21 ENCOUNTER — Telehealth: Payer: Self-pay | Admitting: Family Medicine

## 2021-04-21 MED ORDER — SULFAMETHOXAZOLE-TRIMETHOPRIM 800-160 MG PO TABS: 1.0000 | ORAL_TABLET | Freq: Two times a day (BID) | ORAL | 0 refills | Status: AC

## 2021-04-21 NOTE — Telephone Encounter (Signed)
With some respiratory illnesses it can take a period of time to get over it. Bactrim DS 1 twice daily for 7 days if any ongoing trouble recommend follow-up office visit

## 2021-04-21 NOTE — Telephone Encounter (Signed)
Patient advised of message and instructions. Patient verbalized understanding.

## 2021-04-21 NOTE — Telephone Encounter (Signed)
Patient states still has bad cough and sinus issues. He was seen on 12/5 and given cefprozil 500 mg. It cleared up but came back he is requesting another around of antibiotics called into [place]

## 2021-05-05 ENCOUNTER — Ambulatory Visit (INDEPENDENT_AMBULATORY_CARE_PROVIDER_SITE_OTHER): Payer: Self-pay

## 2021-05-05 DIAGNOSIS — I255 Ischemic cardiomyopathy: Secondary | ICD-10-CM

## 2021-05-06 LAB — CUP PACEART REMOTE DEVICE CHECK
Battery Remaining Longevity: 5 mo
Battery Remaining Percentage: 5 %
Battery Voltage: 2.62 V
Brady Statistic AP VP Percent: 1 %
Brady Statistic AP VS Percent: 1 %
Brady Statistic AS VP Percent: 1 %
Brady Statistic AS VS Percent: 99 %
Brady Statistic RA Percent Paced: 1 %
Brady Statistic RV Percent Paced: 1 %
Date Time Interrogation Session: 20230103020016
HighPow Impedance: 62 Ohm
HighPow Impedance: 62 Ohm
Implantable Lead Implant Date: 20050706
Implantable Lead Implant Date: 20161128
Implantable Lead Location: 753859
Implantable Lead Location: 753860
Implantable Lead Model: 7122
Implantable Pulse Generator Implant Date: 20130727
Lead Channel Impedance Value: 360 Ohm
Lead Channel Impedance Value: 410 Ohm
Lead Channel Pacing Threshold Amplitude: 0.75 V
Lead Channel Pacing Threshold Amplitude: 1 V
Lead Channel Pacing Threshold Pulse Width: 0.5 ms
Lead Channel Pacing Threshold Pulse Width: 0.5 ms
Lead Channel Sensing Intrinsic Amplitude: 2.1 mV
Lead Channel Sensing Intrinsic Amplitude: 9.1 mV
Lead Channel Setting Pacing Amplitude: 2 V
Lead Channel Setting Pacing Amplitude: 2.5 V
Lead Channel Setting Pacing Pulse Width: 0.5 ms
Lead Channel Setting Sensing Sensitivity: 0.5 mV
Pulse Gen Serial Number: 7041246

## 2021-05-12 ENCOUNTER — Encounter: Payer: Self-pay | Admitting: Nurse Practitioner

## 2021-05-12 ENCOUNTER — Other Ambulatory Visit: Payer: Self-pay

## 2021-05-12 ENCOUNTER — Ambulatory Visit (INDEPENDENT_AMBULATORY_CARE_PROVIDER_SITE_OTHER): Payer: Medicare Other | Admitting: Nurse Practitioner

## 2021-05-12 VITALS — BP 136/86 | HR 70 | Temp 97.9°F | Ht 69.0 in | Wt 213.4 lb

## 2021-05-12 DIAGNOSIS — J019 Acute sinusitis, unspecified: Secondary | ICD-10-CM

## 2021-05-12 MED ORDER — MONTELUKAST SODIUM 10 MG PO TABS: 10.0000 mg | ORAL_TABLET | Freq: Every day | ORAL | 3 refills | Status: DC

## 2021-05-12 NOTE — Progress Notes (Signed)
Subjective:    Patient ID: Lance [name], male    DOB: April 24, 1946, 76 y.o.   MRN: [id number]  HPI Patient reports to clinic for productive cough with clear phlegm, nasal congestion, some wheezing, and chest tightness x2 months. Patient states that his normally SOB but thinks he might be slightly more SOB today. Due to coughing, patient states that he has some pain to his posterior rib cage that is relieved when he is not coughing. Patient states that he has seen Dr. Nicki Reaper twice and prescribed an abx twice in the last two months. Patient states that symptoms may get a little better but then he feels bad again after completing the abx. Patient denies fevers, sore throat, body aches, chills. Patient has hx of asthma, CHF, COPD, Tobacco use, and allergic rhinitis. Has an inhaler that he uses a couple times a month that is helpful.   Review of Systems  Constitutional:  Negative for chills and fever.  HENT:  Positive for congestion, ear pain, rhinorrhea and sinus pressure. Negative for ear discharge, postnasal drip, sinus pain and sore throat.   Respiratory:  Positive for cough, chest tightness, shortness of breath and wheezing. Negative for apnea, choking and stridor.   Cardiovascular:  Negative for chest pain and palpitations.  Gastrointestinal:  Negative for diarrhea, nausea and vomiting.  All other systems reviewed and are negative.     Objective:   Physical Exam Constitutional:      General: He is not in acute distress.    Appearance: Normal appearance. He is normal weight. He is not ill-appearing or toxic-appearing.  HENT:     Right Ear: No decreased hearing noted. No laceration, drainage, swelling or tenderness. No middle ear effusion. Tympanic membrane is injected. Tympanic membrane is not erythematous, retracted or bulging.     Left Ear: There is impacted cerumen.     Nose: Nose normal. No congestion or rhinorrhea.     Mouth/Throat:     Mouth: Mucous membranes are moist.     Pharynx:  No oropharyngeal exudate or posterior oropharyngeal erythema.  Eyes:     Extraocular Movements: Extraocular movements intact.     Pupils: Pupils are equal, round, and reactive to light.  Cardiovascular:     Rate and Rhythm: Normal rate and regular rhythm.     Pulses: Normal pulses.     Heart sounds: Normal heart sounds. No murmur heard. Pulmonary:     Effort: Pulmonary effort is normal. No respiratory distress.     Breath sounds: Normal breath sounds. No stridor. No wheezing, rhonchi or rales.  Chest:     Chest wall: No tenderness.  Abdominal:     General: Abdomen is flat.     Palpations: Abdomen is soft.  Musculoskeletal:        General: Normal range of motion.     Cervical back: Normal range of motion and neck supple. No rigidity or tenderness.  Lymphadenopathy:     Cervical: No cervical adenopathy.  Skin:    General: Skin is warm.  Neurological:     General: No focal deficit present.     Mental Status: He is alert and oriented to person, place, and time.  Psychiatric:        Mood and Affect: Mood normal.        Behavior: Behavior normal.          Assessment & Plan:   1. Acute rhinosinusitis - Likely allergy vs viral - Considered COPD etiology. Will consider adding  LABA if symptoms not resolved with Singulair.  - Continue taking SABA PRN. - Bacterial etiology not as likely per physical exam recent abx use.  - COVID-19, Flu A+B and RSV to r/o - Singulair 10mg  daily - RTC if increased SOB, chest pain, increased chest tightness, fever, chills, or wheezing.  - Advised smoking cessation - Continue with medications as prescribed.
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
## 2021-05-14 ENCOUNTER — Ambulatory Visit (INDEPENDENT_AMBULATORY_CARE_PROVIDER_SITE_OTHER): Payer: Medicare Other | Admitting: Gastroenterology

## 2021-05-14 ENCOUNTER — Encounter: Payer: Self-pay | Admitting: Gastroenterology

## 2021-05-14 ENCOUNTER — Other Ambulatory Visit: Payer: Self-pay

## 2021-05-14 VITALS — BP 142/77 | HR 77 | Temp 97.3°F | Ht 69.0 in | Wt 214.6 lb

## 2021-05-14 DIAGNOSIS — Z86010 Personal history of colonic polyps: Secondary | ICD-10-CM | POA: Diagnosis not present

## 2021-05-14 DIAGNOSIS — K5900 Constipation, unspecified: Secondary | ICD-10-CM

## 2021-05-14 DIAGNOSIS — K219 Gastro-esophageal reflux disease without esophagitis: Secondary | ICD-10-CM

## 2021-05-14 LAB — COVID-19, FLU A+B AND RSV
Influenza A, NAA: NOT DETECTED
Influenza B, NAA: NOT DETECTED
RSV, NAA: NOT DETECTED
SARS-CoV-2, NAA: NOT DETECTED

## 2021-05-14 LAB — SPECIMEN STATUS REPORT

## 2021-05-14 NOTE — Patient Instructions (Signed)
Try decreasing pantoprazole to once daily 30 minutes before breakfast and see if your reflux symptoms continue to stay well controlled.  If you begin having breakthrough reflux symptoms 3 times a week, you can increase pantoprazole back to twice daily.  Avoid common reflux triggers including fried, fatty, greasy, spicy, citrus foods and carbonated beverages.   It was great to see you today!  I am glad you are doing well overall!  We will plan to see you back in 4 to 6 months to discuss scheduling your colonoscopy.  Do not hesitate to call if you have any questions or concerns prior to your next visit.   Aliene Altes, PA-C [facility]

## 2021-05-14 NOTE — Progress Notes (Signed)
Please call patient with the following message:  COVID, Flu, and RSV negative. Symptoms likely related to allergies. Let me know how the Singulair is working for you. Let us know if you need any thing else. As a reminder if you develop more SOB than usual or have difficulty breathing go to the Emergency Room or come to the clinic for evaluation.  Hope you feel better.

## 2021-05-15 NOTE — Addendum Note (Signed)
Addended by: Douglass Rivers D on: 05/15/2021 03:30 PM   Modules accepted: Level of Service

## 2021-05-15 NOTE — Progress Notes (Signed)
Remote ICD transmission.   

## 2021-05-18 DIAGNOSIS — G894 Chronic pain syndrome: Secondary | ICD-10-CM | POA: Diagnosis not present

## 2021-05-18 DIAGNOSIS — M47892 Other spondylosis, cervical region: Secondary | ICD-10-CM | POA: Diagnosis not present

## 2021-05-18 DIAGNOSIS — M542 Cervicalgia: Secondary | ICD-10-CM | POA: Diagnosis not present

## 2021-05-18 DIAGNOSIS — M791 Myalgia, unspecified site: Secondary | ICD-10-CM | POA: Diagnosis not present

## 2021-05-19 ENCOUNTER — Other Ambulatory Visit: Payer: Self-pay

## 2021-05-19 ENCOUNTER — Ambulatory Visit (INDEPENDENT_AMBULATORY_CARE_PROVIDER_SITE_OTHER): Payer: Medicare Other | Admitting: Family Medicine

## 2021-05-19 ENCOUNTER — Encounter: Payer: Self-pay | Admitting: *Deleted

## 2021-05-19 DIAGNOSIS — B029 Zoster without complications: Secondary | ICD-10-CM | POA: Diagnosis not present

## 2021-05-19 MED ORDER — VALACYCLOVIR HCL 1 G PO TABS: 1000.0000 mg | ORAL_TABLET | Freq: Three times a day (TID) | ORAL | 0 refills | Status: DC

## 2021-05-19 NOTE — Progress Notes (Signed)
° °  Subjective:    Patient ID: Lance [name], male    DOB: [birth year], 76 y.o.   MRN: [id number]  HPI  Patient arrives with probable singles in his head- started Thursday with burning- saw neck doctor and they told him it looked like shingles in head. Patient with burning discomfort back of the head and on top of the head Denies any injury Blistering rash  Review of Systems     Objective:   Physical Exam Examination shows shingles that is coming up the back of his head to the occiput region.  Does not reach the frontal region not around the eye       Assessment & Plan:  Patient was told if it starts spreading around the eye to follow-up immediately In addition to this recommend Valtrex 3 times daily for 7 days If progressive troubles or worse to notify us and follow-up Recheck 48 hours

## 2021-05-19 NOTE — Patient Instructions (Signed)
Use Valcyclovir, take one three times a day for 7 days, recheck on Thursday come around 4 pm call sooner if getting around the eye  Shingles Shingles is an infection. It gives you a painful skin rash and blisters that have fluid in them. Shingles is caused by the same germ (virus) that causes chickenpox. Shingles only happens in people who: Have had chickenpox. Have been given a shot (vaccine) to protect against chickenpox. Shingles is rare in this group. What are the causes? This condition is caused by varicella-zoster virus. This is the same germ that causes chickenpox. After a person is exposed to the germ, the germ stays in the body but is not active (dormant). Shingles develops if the germ becomes active again (is reactivated). This can happen many years after the first exposure to the germ. It is not known what causes this germ to become active again. What increases the risk? People who have had chickenpox or received the chickenpox shot are at risk for shingles. This infection is more common in people who: Are older than 74 years of age. Have a weakened disease-fighting system (immune system), such as people with: HIV (human immunodeficiency virus). AIDS (acquired immunodeficiency syndrome). Cancer. Are taking medicines that weaken the immune system, such as organ transplant medicines. Have a lot of stress. What are the signs or symptoms? The first symptoms of shingles may be itching, tingling, or pain in an area on your skin. A rash will show on your skin a few days or weeks later. This is what usually happens: The rash is likely to be on one side of your body. The rash usually has a shape like a belt or a band. Over time, the rash turns into fluid-filled blisters. The blisters will break open and change into scabs. The scabs usually dry up in about 2-3 weeks. You may also have: A fever. Chills. A headache. A feeling like you may vomit (nausea). How is this treated? The rash  may last for several weeks. There is not a specific cure for this condition. Your doctor may prescribe medicines. Medicines may: Help with pain. Help you get better sooner. Help to prevent long-term problems. Help with itching (antihistamines). If the area involved is on your face, you may need to see a specialist. This may be an eye doctor or an ear, nose, and throat (ENT) doctor. Follow these instructions at home: Medicines Take over-the-counter and prescription medicines only as told by your doctor. Put on an anti-itch cream or numbing cream where you have a rash, blisters, or scabs. Do this as told by your doctor. Helping with itching and discomfort  Put cold, wet cloths (cold compresses) on the area of the rash or blisters as told by your doctor. Cool baths can help you feel better. Try adding baking soda or dry oatmeal to the water to lessen itching. Do not bathe in hot water. Use calamine lotion as told by your doctor. Blister and rash care Keep your rash covered with a loose bandage (dressing). Wear loose clothing that does not rub on your rash. Wash your hands with soap and water for at least 20 seconds before and after you change your bandage. If you cannot use soap and water, use hand sanitizer. Change your bandage as told by your doctor. Keep your rash and blisters clean. To do this, wash the area with mild soap and cool water as told by your doctor. Check your rash every day for signs of infection. Check for: More  redness, swelling, or pain. Fluid or blood. Warmth. Pus or a bad smell. Do not scratch your rash. Do not pick at your blisters. To help you to not scratch: Keep your fingernails clean and cut short. Wear gloves or mittens when you sleep, if scratching is a problem. General instructions Rest as told by your doctor. Wash your hands often with soap and water for at least 20 seconds. If you cannot use soap and water, use hand sanitizer. Doing this lowers your chance  of getting a skin infection. Your infection can cause chickenpox in people who have never had chickenpox or never got a chickenpox vaccine shot. If you have blisters that did not change into scabs yet, try not to touch other people or be around other people, especially: Babies. Pregnant women. Children who have areas of red, itchy, or rough skin (eczema). Older people who have organ transplants. People who have a long-term (chronic) illness, like cancer or AIDS. Keep all follow-up visits. How is this prevented? A vaccine shot is the best way to prevent shingles and protect against shingles problems. If you have not had a vaccine shot, talk with your doctor about getting it. Where to find more information Centers for Disease Control and Prevention: http://www.wolf.info/ Contact a doctor if: Your pain does not get better with medicine. Your pain does not get better after the rash heals. You have any of these signs of infection around the rash: More redness, swelling, or pain. Fluid or blood. Warmth. Pus or a bad smell. You have a fever. Get help right away if: The rash is on your face or nose. You have pain in your face or pain by your eye. You lose feeling on one side of your face. You have trouble seeing. You have ear pain, or you have ringing in your ear. You have a loss of taste. Your condition gets worse. Summary Shingles gives you a painful skin rash and blisters that have fluid in them. Shingles is caused by the same germ (virus) that causes chickenpox. Keep your rash covered with a loose bandage. Wear loose clothing that does not rub on your rash. If you have blisters that did not change into scabs yet, try not to touch other people or be around people. This information is not intended to replace advice given to you by your health care provider. Make sure you discuss any questions you have with your health care provider. Document Revised: 04/14/2020 Document Reviewed:  04/14/2020 Elsevier Patient Education  2022 [employer].

## 2021-05-20 ENCOUNTER — Other Ambulatory Visit: Payer: Self-pay | Admitting: Family Medicine

## 2021-05-20 ENCOUNTER — Other Ambulatory Visit: Payer: Self-pay | Admitting: Cardiology

## 2021-05-21 ENCOUNTER — Ambulatory Visit (INDEPENDENT_AMBULATORY_CARE_PROVIDER_SITE_OTHER): Payer: Medicare Other | Admitting: Family Medicine

## 2021-05-21 ENCOUNTER — Other Ambulatory Visit: Payer: Self-pay

## 2021-05-21 VITALS — Ht 69.0 in | Wt 214.0 lb

## 2021-05-21 DIAGNOSIS — B029 Zoster without complications: Secondary | ICD-10-CM

## 2021-05-21 NOTE — Progress Notes (Signed)
° °  Subjective:    Patient ID: Lance [name], male    DOB: 12-14-1945, 76 y.o.   MRN: [id number]  HPI  Patient arrives for a recheck on shingles on his head. Area on the head is actually not progressed it still causing pain discomfort but he is managing it with hydrocodone taking the Valtrex 3 times daily Review of Systems     Objective:   Physical Exam  Posterior the head and vertex with blisters consistent with shingles on the superior portion of the trigeminal nerve versus the possibility of occipital nerve there is nothing on the forehead or the face      Assessment & Plan:   Patient was told if it progresses to the forehead or around the eye he needs to reach out to [place] otherwise follow-up as needed finish out Valtrex call us if any problems

## 2021-06-05 ENCOUNTER — Ambulatory Visit (INDEPENDENT_AMBULATORY_CARE_PROVIDER_SITE_OTHER): Payer: Medicare Other

## 2021-06-05 DIAGNOSIS — I255 Ischemic cardiomyopathy: Secondary | ICD-10-CM | POA: Diagnosis not present

## 2021-06-05 LAB — CUP PACEART REMOTE DEVICE CHECK
Battery Remaining Longevity: 5 mo
Battery Remaining Percentage: 5 %
Battery Voltage: 2.62 V
Brady Statistic AP VP Percent: 1 %
Brady Statistic AP VS Percent: 1 %
Brady Statistic AS VP Percent: 1 %
Brady Statistic AS VS Percent: 99 %
Brady Statistic RA Percent Paced: 1 %
Brady Statistic RV Percent Paced: 1 %
Date Time Interrogation Session: 20230203022427
HighPow Impedance: 62 Ohm
HighPow Impedance: 62 Ohm
Implantable Lead Implant Date: 20050706
Implantable Lead Implant Date: 20161128
Implantable Lead Location: 753859
Implantable Lead Location: 753860
Implantable Lead Model: 7122
Implantable Pulse Generator Implant Date: 20130727
Lead Channel Impedance Value: 360 Ohm
Lead Channel Impedance Value: 410 Ohm
Lead Channel Pacing Threshold Amplitude: 0.75 V
Lead Channel Pacing Threshold Amplitude: 1 V
Lead Channel Pacing Threshold Pulse Width: 0.5 ms
Lead Channel Pacing Threshold Pulse Width: 0.5 ms
Lead Channel Sensing Intrinsic Amplitude: 10.3 mV
Lead Channel Sensing Intrinsic Amplitude: 2.6 mV
Lead Channel Setting Pacing Amplitude: 2 V
Lead Channel Setting Pacing Amplitude: 2.5 V
Lead Channel Setting Pacing Pulse Width: 0.5 ms
Lead Channel Setting Sensing Sensitivity: 0.5 mV
Pulse Gen Serial Number: 7041246

## 2021-06-10 NOTE — Progress Notes (Signed)
Remote ICD transmission.   

## 2021-06-16 ENCOUNTER — Telehealth: Payer: Self-pay

## 2021-06-16 MED ORDER — METOPROLOL TARTRATE 25 MG PO TABS: 25.0000 mg | ORAL_TABLET | Freq: Two times a day (BID) | ORAL | 3 refills | Status: DC

## 2021-06-16 NOTE — Telephone Encounter (Signed)
Medication refill request for Metoprolol Tartrate 25 mg tablets approved and dent to [facility]

## 2021-06-17 ENCOUNTER — Other Ambulatory Visit: Payer: Self-pay

## 2021-06-17 MED ORDER — MIRTAZAPINE 15 MG PO TABS: 15.0000 mg | ORAL_TABLET | Freq: Every day | ORAL | 1 refills | Status: DC

## 2021-06-24 ENCOUNTER — Telehealth: Payer: Self-pay | Admitting: *Deleted

## 2021-06-24 NOTE — Chronic Care Management (AMB) (Signed)
°  Chronic Care Management   Outreach Note  06/24/2021 Name: NEIMAN ROOTS MRN: [id number] DOB: 1946/04/18  [name] is a 76 y.o. year old male who is a primary care patient of Luking, Elayne Snare, MD. I reached out to [name] by phone today in response to a referral sent by Mr. QUOC TOME primary care provider.  An unsuccessful telephone outreach was attempted today. The patient was referred to the case management team for assistance with care management and care coordination.   Follow Up Plan: A HIPAA compliant phone message was left for the patient providing contact information and requesting a return call.  The care management team will reach out to the patient again over the next 7 days.  If patient returns call to provider office, please advise to call [place]* at [phone].*  [facility]  Direct Dial: [phone]

## 2021-06-25 ENCOUNTER — Encounter: Payer: Self-pay | Admitting: Urology

## 2021-06-25 ENCOUNTER — Ambulatory Visit (INDEPENDENT_AMBULATORY_CARE_PROVIDER_SITE_OTHER): Payer: Medicare Other | Admitting: Urology

## 2021-06-25 ENCOUNTER — Other Ambulatory Visit: Payer: Self-pay

## 2021-06-25 VITALS — BP 136/66 | HR 71 | Wt 212.0 lb

## 2021-06-25 DIAGNOSIS — R350 Frequency of micturition: Secondary | ICD-10-CM

## 2021-06-25 DIAGNOSIS — N401 Enlarged prostate with lower urinary tract symptoms: Secondary | ICD-10-CM

## 2021-06-25 DIAGNOSIS — N529 Male erectile dysfunction, unspecified: Secondary | ICD-10-CM | POA: Diagnosis not present

## 2021-06-25 LAB — URINALYSIS, ROUTINE W REFLEX MICROSCOPIC
Bilirubin, UA: NEGATIVE
Leukocytes,UA: NEGATIVE
Nitrite, UA: NEGATIVE
Protein,UA: NEGATIVE
RBC, UA: NEGATIVE
Specific Gravity, UA: 1.015 (ref 1.005–1.030)
Urobilinogen, Ur: 0.2 mg/dL (ref 0.2–1.0)
pH, UA: 5.5 (ref 5.0–7.5)

## 2021-06-25 MED ORDER — TADALAFIL 20 MG PO TABS: 20.0000 mg | ORAL_TABLET | Freq: Every day | ORAL | 6 refills | Status: DC | PRN

## 2021-06-25 MED ORDER — TAMSULOSIN HCL 0.4 MG PO CAPS: ORAL_CAPSULE | ORAL | 11 refills | Status: DC
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
## 2021-06-25 NOTE — Chronic Care Management (AMB) (Signed)
Chronic Care Management  ° °Note ° °06/25/2021 °Name: Lance [name] MRN: [id number] DOB: 11/11/1945 ° °Lance [name] is a 75 y.o. year old male who is a primary care patient of Luking, Scott A, MD. I reached out to Lance [name] by phone today in response to a referral sent by Lance [name] PCP. ° °Lance [name] was given information about Chronic Care Management services today including:  °CCM service includes personalized support from designated clinical staff supervised by his physician, including individualized plan of care and coordination with other care providers °24/7 contact phone numbers for assistance for urgent and routine care needs. °Service will only be billed when office clinical staff spend 20 minutes or more in a month to coordinate care. °Only one practitioner may furnish and bill the service in a calendar month. °The patient may stop CCM services at any time (effective at the end of the month) by phone call to the office staff. °The patient is responsible for co-pay (up to 20% after annual deductible is met) if co-pay is required by the individual health plan.  ° °Patient agreed to services and verbal consent obtained.  ° °Follow up plan: °Face to Face appointment with care management team member scheduled for: 06/29/21 ° °   °Care Guide, Embedded Care Coordination °[facility]   Care Management  °Direct Dial: [phone] ° °

## 2021-06-26 ENCOUNTER — Other Ambulatory Visit: Payer: Self-pay | Admitting: *Deleted

## 2021-06-26 MED ORDER — MONTELUKAST SODIUM 10 MG PO TABS: 10.0000 mg | ORAL_TABLET | Freq: Every day | ORAL | 1 refills | Status: DC

## 2021-06-29 ENCOUNTER — Ambulatory Visit: Payer: Medicare Other

## 2021-07-02 DIAGNOSIS — I2510 Atherosclerotic heart disease of native coronary artery without angina pectoris: Secondary | ICD-10-CM | POA: Diagnosis not present

## 2021-07-02 DIAGNOSIS — E1142 Type 2 diabetes mellitus with diabetic polyneuropathy: Secondary | ICD-10-CM | POA: Diagnosis not present

## 2021-07-02 DIAGNOSIS — E1165 Type 2 diabetes mellitus with hyperglycemia: Secondary | ICD-10-CM | POA: Diagnosis not present

## 2021-07-02 DIAGNOSIS — Z794 Long term (current) use of insulin: Secondary | ICD-10-CM | POA: Diagnosis not present

## 2021-07-06 ENCOUNTER — Ambulatory Visit (INDEPENDENT_AMBULATORY_CARE_PROVIDER_SITE_OTHER): Payer: Medicare Other

## 2021-07-06 DIAGNOSIS — I255 Ischemic cardiomyopathy: Secondary | ICD-10-CM | POA: Diagnosis not present

## 2021-07-06 LAB — CUP PACEART REMOTE DEVICE CHECK
Battery Remaining Longevity: 3 mo
Battery Remaining Percentage: 3 %
Battery Voltage: 2.6 V
Brady Statistic AP VP Percent: 1 %
Brady Statistic AP VS Percent: 1 %
Brady Statistic AS VP Percent: 1 %
Brady Statistic AS VS Percent: 99 %
Brady Statistic RA Percent Paced: 1 %
Brady Statistic RV Percent Paced: 1 %
Date Time Interrogation Session: 20230306020018
HighPow Impedance: 59 Ohm
HighPow Impedance: 59 Ohm
Implantable Lead Implant Date: 20050706
Implantable Lead Implant Date: 20161128
Implantable Lead Location: 753859
Implantable Lead Location: 753860
Implantable Lead Model: 7122
Implantable Pulse Generator Implant Date: 20130727
Lead Channel Impedance Value: 360 Ohm
Lead Channel Impedance Value: 400 Ohm
Lead Channel Pacing Threshold Amplitude: 0.75 V
Lead Channel Pacing Threshold Amplitude: 1 V
Lead Channel Pacing Threshold Pulse Width: 0.5 ms
Lead Channel Pacing Threshold Pulse Width: 0.5 ms
Lead Channel Sensing Intrinsic Amplitude: 10 mV
Lead Channel Sensing Intrinsic Amplitude: 2.1 mV
Lead Channel Setting Pacing Amplitude: 2 V
Lead Channel Setting Pacing Amplitude: 2.5 V
Lead Channel Setting Pacing Pulse Width: 0.5 ms
Lead Channel Setting Sensing Sensitivity: 0.5 mV
Pulse Gen Serial Number: 7041246

## 2021-07-14 DIAGNOSIS — M542 Cervicalgia: Secondary | ICD-10-CM | POA: Diagnosis not present

## 2021-07-14 DIAGNOSIS — G894 Chronic pain syndrome: Secondary | ICD-10-CM | POA: Diagnosis not present

## 2021-07-14 DIAGNOSIS — Z79899 Other long term (current) drug therapy: Secondary | ICD-10-CM | POA: Diagnosis not present

## 2021-07-14 DIAGNOSIS — Z79891 Long term (current) use of opiate analgesic: Secondary | ICD-10-CM | POA: Diagnosis not present

## 2021-07-16 NOTE — Progress Notes (Signed)
Remote ICD transmission.   

## 2021-07-20 ENCOUNTER — Ambulatory Visit (INDEPENDENT_AMBULATORY_CARE_PROVIDER_SITE_OTHER): Payer: Medicare Other | Admitting: Pharmacist

## 2021-07-20 ENCOUNTER — Other Ambulatory Visit: Payer: Self-pay | Admitting: Family Medicine

## 2021-07-20 ENCOUNTER — Other Ambulatory Visit: Payer: Self-pay

## 2021-07-20 DIAGNOSIS — I509 Heart failure, unspecified: Secondary | ICD-10-CM

## 2021-07-20 DIAGNOSIS — E1159 Type 2 diabetes mellitus with other circulatory complications: Secondary | ICD-10-CM

## 2021-07-20 DIAGNOSIS — E785 Hyperlipidemia, unspecified: Secondary | ICD-10-CM

## 2021-07-20 DIAGNOSIS — G459 Transient cerebral ischemic attack, unspecified: Secondary | ICD-10-CM

## 2021-07-20 DIAGNOSIS — I10 Essential (primary) hypertension: Secondary | ICD-10-CM

## 2021-07-20 NOTE — Chronic Care Management (AMB) (Signed)
? ? ?Chronic Care Management ?Pharmacy Note ? ?07/20/2021 ?Name:  Lance [name] MRN:  [id number] DOB:  1945/05/16 ? ?Summary: ?General: ?Patient had severe shingles in January. Now that it has cleared up, patient reminded importance of Shingles vaccine. Patient reports he plans to get soon.  ? ?Type 2 Diabetes ?Follows with Dr. [name] at [facility]  ?Uncontrolled; Most recent A1c within our health system 8.9% back in 2021 but patient verbally reports more recent A1c of 7.2% which is above goal of <7% per ADA guidelines ?Current medications: dapagliflozin (Farxiga) 10 mg by mouth once daily and insulin detemir (Levemir) 100 units subcutaneously at bedtime and insulin lispro (Humalog) 25 units subcutaneously 5-15 minutes before each meal ?Continue current medications as above per endocrinology ?Instructed to monitor blood sugars four times a day at the following times: 5-15 minutes before all meals, bedtime, and whenever patient experiences symptoms of hypo/hyperglycemia  ? ?Hyperlipidemia ?Controlled per most recent lipid panel but this was in 2017. Needs updated panel to assess current control; however, would assume mostly well controlled if patient adherent to current regimen. LDL at goal of <55 due to extreme risk given established clinical ASCVD + diabetes per 2023 ADA Standards of Care in Diabetes. Triglycerides above goal of <150 per 2020 AACE/ACE guidelines. ?Current medications: rosuvastatin 40 mg by mouth once daily and atorvastatin 80 mg by mouth daily ?Patient brought in pill bottles today. Identified that patient has been taking two high intensity statins prescribed by two different providers at different health systems and dispensed at two different pharmacies.  ?Antiplatelet: aspirin 81 mg by mouth daily and clopidogrel 75 mg by mouth daily ?Continue rosuvastatin 40 mg by mouth once daily ?Discontinue atorvastatin 80 mg by mouth once daily ?Re-check lipid panel at next primary care provider or  cardiology follow-up ?Continue antiplatelet therapy per cardiology ?Continue follow-up with cardiology ? ?Heart failure with preserved ejection fraction ?Current treatment: losartan 25 mg by mouth once daily, metoprolol tartrate 25 mg by mouth twice daily, and dapagliflozin (Farxiga) 10 mg by mouth once daily ?Patient has ICD ?Continue losartan 25 mg by mouth once daily, metoprolol tartrate 25 mg by mouth twice daily, and dapagliflozin (Farxiga) 10 mg by mouth once daily ?Continue follow-up with cardiology ? ?Subjective: ?Lance [name] is an 76 y.o. year old male who is a primary patient of Luking, Elayne Snare, MD.  The CCM team was consulted for assistance with disease management and care coordination needs.   ? ?Engaged with patient face to face for initial visit in response to provider referral for pharmacy case management and/or care coordination services.  ? ?Consent to Services:  ?The patient was given the following information about Chronic Care Management services today, agreed to services, and gave verbal consent: 1. CCM service includes personalized support from designated clinical staff supervised by the primary care provider, including individualized plan of care and coordination with other care providers 2. 24/7 contact phone numbers for assistance for urgent and routine care needs. 3. Service will only be billed when office clinical staff spend 20 minutes or more in a month to coordinate care. 4. Only one practitioner may furnish and bill the service in a calendar month. 5.The patient may stop CCM services at any time (effective at the end of the month) by phone call to the office staff. 6. The patient will be responsible for cost sharing (co-pay) of up to 20% of the service fee (after annual deductible is met). Patient agreed to services and consent obtained. ? ?  Patient Care Team: ?[name], MD as PCP - General (Family Medicine) ?Arnoldo Lenis, MD as PCP - Cardiology (Cardiology) ?Evans Lance, MD as PCP - Electrophysiology (Cardiology) ?Rourk, Cristopher Estimable, MD (Gastroenterology) ?Beryle Lathe, [facility] (Pharmacist) ? ?Objective: ? ?Lab Results  ?Component Value Date  ? CREATININE 1.05 07/21/2020  ? CREATININE 1.00 12/12/2019  ? CREATININE 1.18 03/09/2019  ? ? ?Lab Results  ?Component Value Date  ? HGBA1C 8.9 (H) 12/12/2019  ? ?Last diabetic Eye exam:  ?Lab Results  ?Component Value Date/Time  ? HMDIABEYEEXA Retinopathy (A) 05/14/2016 12:00 AM  ?  ?Last diabetic Foot exam: No results found for: HMDIABFOOTEX  ? ?   ?Component Value Date/Time  ? CHOL 125 11/21/2015 0753  ? TRIG 178 (H) 11/21/2015 0753  ? HDL 34 (L) 11/21/2015 0753  ? CHOLHDL 3.7 11/21/2015 0753  ? VLDL 36 (H) 11/21/2015 0753  ? [place] 55 11/21/2015 0753  ? ? ?Hepatic Function Latest Ref Rng & Units 06/28/2018 11/21/2015 07/07/2015  ?Total Protein 6.5 - 8.1 g/dL 6.2(L) 6.2 5.8(L)  ?Albumin 3.5 - 5.0 g/dL 3.6 3.7 3.0(L)  ?AST 15 - 41 U/L '19 26 21  ' ?ALT 0 - 44 U/L '22 26 26  ' ?Alk Phosphatase 38 - 126 U/L 63 94 82  ?Total Bilirubin 0.3 - 1.2 mg/dL 0.4 0.3 0.4  ?Bilirubin, Direct 0.1 - 0.5 mg/dL - - <0.1(L)  ? ? ?Lab Results  ?Component Value Date/Time  ? TSH 2.29 10/03/2018 10:11 AM  ? TSH 3.40 11/21/2015 07:53 AM  ? ? ?CBC Latest Ref Rng & Units 07/21/2020 10/03/2018 07/14/2018  ?WBC 4.0 - 10.5 K/uL 6.4 5.0 6.0  ?Hemoglobin 13.0 - 17.0 g/dL 14.6 14.1 13.0  ?Hematocrit 39.0 - 52.0 % 43.9 41.3 40.0  ?Platelets 150 - 400 K/uL 148(L) 118.0(L) 128(L)  ? ? ?No results found for: VD25OH ? ?Clinical ASCVD: Yes  ?The ASCVD Risk score (Arnett DK, et al., 2019) failed to calculate for the following reasons: ?  The patient has a prior MI or stroke diagnosis   ? ? ?Social History  ? ?Tobacco Use  ?Smoking Status Former  ? Packs/day: 3.00  ? Years: 40.00  ? Pack years: 120.00  ? Types: Cigarettes  ? Start date: 05/03/1954  ? Quit date: 05/03/1998  ? Years since quitting: 23.2  ?Smokeless Tobacco Current  ? Types: Chew  ? ?BP Readings from Last 3 Encounters:   ?06/25/21 136/66  ?05/14/21 (!) 142/77  ?05/12/21 136/86  ? ?Pulse Readings from Last 3 Encounters:  ?06/25/21 71  ?05/14/21 77  ?05/12/21 70  ? ?Wt Readings from Last 3 Encounters:  ?06/25/21 212 lb (96.2 kg)  ?05/21/21 214 lb (97.1 kg)  ?05/14/21 214 lb 9.6 oz (97.3 kg)  ? ? ?Assessment: Review of patient past medical history, allergies, medications, health status, including review of consultants reports, laboratory and other test data, was performed as part of comprehensive evaluation and provision of chronic care management services.  ? ?SDOH:  (Social Determinants of Health) assessments and interventions performed:  ?SDOH Interventions   ? ?Flowsheet Row Most Recent Value  ?SDOH Interventions   ?Financial Strain Interventions Intervention Not Indicated  ? ?  ? ? ?Jet ? ?Allergies  ?Allergen Reactions  ? Morphine And Related Other (See Comments)  ?  hallucinations  ? Penicillins Hives  ?  Can take cefzil ?Has patient had a PCN reaction causing immediate rash, facial/tongue/throat swelling, SOB or lightheadedness with hypotension:unsure ?Has patient had a PCN  reaction causing severe rash involving mucus membranes or skin necrosis:unsure ?Has patient had a PCN reaction that required hospitalization:unsure ?Has patient had a PCN reaction occurring within the last 10 years:No ?If all of the above answers are "NO", then may proceed with Cephalosporin use. ?Childhood reaction.  ? Percocet [Oxycodone-Acetaminophen] Other (See Comments)  ?  hallucinations  ? Latex Rash  ? Levaquin [Levofloxacin In D5w] Itching  ? Metformin And Related Other (See Comments)  ?  In high doses, causes diarrhea abdominal bloating   ? Tape Rash  ? ? ?Medications Reviewed Today   ? ? Reviewed by Beryle Lathe, [facility] (Pharmacist) on 07/20/21 at 1008  Med List Status: <None>  ? ?Medication Order Taking? Sig Documenting Provider Last Dose Status Informant  ?ALPRAZolam (XANAX) 0.25 MG tablet [id number] Yes Take one tablet po BID   ?Patient taking differently: Take 0.25 mg by mouth 2 (two) times daily.  ? [name], MD Taking Active Self  ?aspirin EC 81 MG tablet [id number] Yes Take 81 mg by mouth daily. [provider] Cyndie Chime

## 2021-07-20 NOTE — Patient Instructions (Signed)
Lance [name], ? ?It was great to talk to you today! ? ?Please call me with any questions or concerns. ? ?Visit Information  ? ?Following is a copy of your full care plan:  ?Care Plan : Medication Management  ?Updates made by Beryle Lathe, [place] since 07/20/2021 12:00 AM  ?  ? ?Problem: T2DM, HTN, CHF, HLD   ?Priority: High  ?Onset Date: 07/20/2021  ?  ? ?Long-Range Goal: Disease Progression Prevention   ?Start Date: 07/20/2021  ?Expected End Date: 10/18/2021  ?This Visit's Progress: On track  ?Priority: High  ?Note:   ?Current Barriers:  ?Unable to independently monitor therapeutic efficacy ?Unable to achieve control of diabetes and hyperlipidemia ? ?Pharmacist Clinical Goal(s):  ?Through collaboration with PharmD and provider, patient will  ?Achieve adherence to monitoring guidelines and medication adherence to achieve therapeutic efficacy ?Achieve control of diabetes and hyperlipidemia as evidenced by improved fasting blood sugar, improved A1c, and improved triglycerides  ? ?Interventions: ?1:1 collaboration with [name], MD regarding development and update of comprehensive plan of care as evidenced by provider attestation and co-signature ?Inter-disciplinary care team collaboration (see longitudinal plan of care) ?Comprehensive medication review performed; medication list updated in electronic medical record ? ?Type 2 Diabetes - New goal.: ?Follows with Dr. [name] at [place]  ?Uncontrolled; Most recent A1c within our health system 8.9% back in 2021 but patient verbally reports more recent A1c of 7.2% which is above goal of <7% per ADA guidelines ?Current medications: dapagliflozin (Farxiga) 10 mg by mouth once daily and insulin detemir (Levemir) 100 units subcutaneously at bedtime and insulin lispro (Humalog) 25 units subcutaneously 5-15 minutes before each meal ?Intolerances:  metformin (gastrointestinal) ?Taking medications as directed: yes ?Side effects thought to be attributed to current  medication regimen: no ?On a statin: yes ?On aspirin 81 mg daily: yes ?Last microalbumin/creatinine ratio: unknown; on an ACEi/ARB: yes ?Current glucose readings:  not discussed today. Patient did not have blood glucose log or meter. Follows closely with endocrinology though.  ?Continue current medications as above per endocrinology ?Instructed to monitor blood sugars four times a day at the following times: 5-15 minutes before all meals, bedtime, and whenever patient experiences symptoms of hypo/hyperglycemia  ?Encouraged regular aerobic exercise with a goal of 30 minutes five times per week (150 minutes per week) ?Continue follow-up with endocrinology ? ?Hypertension - New goal.: ?Blood pressure under good control. Blood pressure is at goal of <140/90 mmHg per 2022 AAFP guidelines. ?Current medications: losartan 25 mg by mouth once daily and metoprolol tartrate 25 mg by mouth twice daily ?Intolerances: none ?Taking medications as directed: yes ?Side effects thought to be attributed to current medication regimen: no ?Current home blood pressure: patient does not currently check ?Continue losartan 25 mg by mouth once daily and metoprolol tartrate 25 mg by mouth twice daily ?Encourage dietary sodium restriction/DASH diet ?Recommend home blood pressure monitoring to discuss at next visit ? ?Hyperlipidemia - New goal.: ?Controlled per most recent lipid panel but this was in 2017. Needs updated panel to assess current control; however, would assume mostly well controlled if patient adherent to current regimen. LDL at goal of <55 due to extreme risk given established clinical ASCVD + diabetes per 2023 ADA Standards of Care in Diabetes. Triglycerides above goal of <150 per 2020 AACE/ACE guidelines. ?Current medications: rosuvastatin 40 mg by mouth once daily and atorvastatin 80 mg by mouth daily ?Patient brought in pill bottles today. Identified that patient has been taking two high intensity statins  prescribed by two  different providers at different health systems and dispensed at two different pharmacies.  ?Antiplatelet: aspirin 81 mg by mouth daily and clopidogrel 75 mg by mouth daily ?Intolerances: none ?Taking medications as directed: yes ?Side effects thought to be attributed to current medication regimen: no ?Continue rosuvastatin 40 mg by mouth once daily ?Discontinue atorvastatin 80 mg by mouth once daily ?Re-check lipid panel at next primary care provider or cardiology follow-up ?Continue antiplatelet therapy per cardiology ?Encourage dietary reduction of high fat containing foods such as butter, nuts, bacon, egg yolks, etc. ?Continue follow-up with cardiology ? ?Heart failure with preserved ejection fraction (LVEF >50% with evidence of spontaneous or provokable increased LV filling pressures) - New goal.: ?Appropriately managed ?Current treatment: losartan 25 mg by mouth once daily, metoprolol tartrate 25 mg by mouth twice daily, and dapagliflozin (Farxiga) 10 mg by mouth once daily ?Patient has ICD  ?Stage C (Symptomatic heart failure)/NYHA Class II (Slight limitation of physical activity. Comfortable at rest. Ordinary physical activity results in fatigue, palpitation, or dyspnea) ?Most recent echocardiogram: LVEF 50-55% (08/12/20) stable from prior ?Current home weight: unknown ?Continue losartan 25 mg by mouth once daily, metoprolol tartrate 25 mg by mouth twice daily, and dapagliflozin (Farxiga) 10 mg by mouth once daily ?Encourage dietary sodium restriction (<3 g/day) ?Educated on the importance of weighing daily. Patient aware to contact cardiology/primary care team if weight gain >3 lbs in 1 day or >5 lbs in 1 week ?Recommend home blood pressure monitoring, to bring results in next visit ?Continue follow-up with cardiology ? ?Patient Goals/Self-Care Activities ?Patient will:  ?Focus on medication adherence by keeping up with prescription refills and either using a pill box or reminders to take your medications at  the prescribed times ?Check blood sugar four times a day at the following times: 5-15 minutes before all meals, bedtime, and whenever patient experiences symptoms of hypo/hyperglycemia, document, and provide at future appointments ?Weigh daily, and contact provider if weight gain of more than 3 lbs in 1 day or more than 5 lbs in 1 week ? ?Follow Up Plan:  Continue follow-up with endocrinology, cardiology, and primary care provider ?  ? ? ?Consent to CCM Services: ?Mr. Sheils was given information about Chronic Care Management services including:  ?CCM service includes personalized support from designated clinical staff supervised by his physician, including individualized plan of care and coordination with other care providers ?24/7 contact phone numbers for assistance for urgent and routine care needs. ?Service will only be billed when office clinical staff spend 20 minutes or more in a month to coordinate care. ?Only one practitioner may furnish and bill the service in a calendar month. ?The patient may stop CCM services at any time (effective at the end of the month) by phone call to the office staff. ?The patient will be responsible for cost sharing (co-pay) of up to 20% of the service fee (after annual deductible is met). ? ?Patient agreed to services and verbal consent obtained.  ? ?Plan:  Continue follow-up with endocrinology, cardiology, and primary care provider ? ?Patient verbalizes understanding of instructions and care plan provided today and agrees to view in [place]. Active MyChart status confirmed with patient.   ? ?Please call the care guide team at [phone] if you need to cancel or reschedule your appointment.  ? ?Kennon Holter, PharmD, BCACP, CPP ?Clinical Pharmacist Practitioner ?[place] ?[phone]  ?

## 2021-07-21 ENCOUNTER — Telehealth: Payer: Self-pay

## 2021-07-21 NOTE — Telephone Encounter (Signed)
ICD reached RRT 07/20/21. Called patient to advise his device reached RRT and will need apt. With Dr. Lovena Le to discuss gen change.  ? ?Has apt with Dr. Lovena Le on 09/09/21, routing incase something is available prior.  ? ?Advised pt I will forward to scheduling to see if something is available prior. Pt voiced understanding.  ?

## 2021-07-28 ENCOUNTER — Other Ambulatory Visit: Payer: Self-pay | Admitting: Family Medicine

## 2021-07-28 ENCOUNTER — Ambulatory Visit: Payer: Medicare Other | Admitting: Internal Medicine

## 2021-07-31 DIAGNOSIS — I10 Essential (primary) hypertension: Secondary | ICD-10-CM | POA: Diagnosis not present

## 2021-07-31 DIAGNOSIS — G459 Transient cerebral ischemic attack, unspecified: Secondary | ICD-10-CM | POA: Diagnosis not present

## 2021-07-31 DIAGNOSIS — E1159 Type 2 diabetes mellitus with other circulatory complications: Secondary | ICD-10-CM

## 2021-07-31 DIAGNOSIS — I509 Heart failure, unspecified: Secondary | ICD-10-CM | POA: Diagnosis not present

## 2021-07-31 DIAGNOSIS — E785 Hyperlipidemia, unspecified: Secondary | ICD-10-CM

## 2021-08-04 ENCOUNTER — Encounter: Payer: Self-pay | Admitting: Internal Medicine

## 2021-08-04 ENCOUNTER — Ambulatory Visit (INDEPENDENT_AMBULATORY_CARE_PROVIDER_SITE_OTHER): Payer: Medicare Other | Admitting: Internal Medicine

## 2021-08-04 ENCOUNTER — Encounter: Payer: Self-pay | Admitting: *Deleted

## 2021-08-04 VITALS — BP 108/60 | HR 78 | Ht 69.0 in | Wt 220.0 lb

## 2021-08-04 DIAGNOSIS — I10 Essential (primary) hypertension: Secondary | ICD-10-CM | POA: Diagnosis not present

## 2021-08-04 NOTE — Patient Instructions (Signed)
Medication Instructions:  ?Your physician recommends that you continue on your current medications as directed. Please refer to the Current Medication list given to you today. ? ?*If you need a refill on your cardiac medications before your next appointment, please call your pharmacy* ? ? ?Lab Work: ?Your physician recommends that you return for lab work one week prior to your procedure.  ? ?If you have labs (blood work) drawn today and your tests are completely normal, you will receive your results only by: ?MyChart Message (if you have MyChart) OR ?A paper copy in the mail ?If you have any lab test that is abnormal or we need to change your treatment, we will call you to review the results. ? ? ?Testing/Procedures: ?Your physician has recommended that you have a defibrillator inserted. An implantable cardioverter defibrillator (ICD) is a small device that is placed in your chest or, in rare cases, your abdomen. This device uses electrical pulses or shocks to help control life-threatening, irregular heartbeats that could lead the heart to suddenly stop beating (sudden cardiac arrest). Leads are attached to the ICD that goes into your heart. This is done in the hospital and usually requires an overnight stay. Please see the instruction sheet given to you today for more information. ? ? ? ?Follow-Up: ?At [facility], you and your health needs are our priority.  As part of our continuing mission to provide you with exceptional heart care, we have created designated Provider Care Teams.  These Care Teams include your primary Cardiologist (physician) and Advanced Practice Providers (APPs -  Physician Assistants and Nurse Practitioners) who all work together to provide you with the care you need, when you need it. ? ?We recommend signing up for the patient portal called "MyChart".  Sign up information is provided on this After Visit Summary.  MyChart is used to connect with patients for Virtual Visits (Telemedicine).   Patients are able to view lab/test results, encounter notes, upcoming appointments, etc.  Non-urgent messages can be sent to your provider as well.   ?To learn more about what you can do with MyChart, go to [email or web address].   ? ?Your next appointment:   ? 14 Days for wound check  ? ?The format for your next appointment:   ?In Person ? ?Provider:   ?Cristopher Peru, MD  ? ? ?Other Instructions ?Thank you for choosing [facility]! ?  ?Dates for Gen change are May 9, 11, 18   ?

## 2021-08-04 NOTE — Progress Notes (Signed)
? ? ? ? ?HPI ?Lance [name] returns today for ongoing evaluation of his ICD, chronic systolic heart failure, and an ischemic cardiomyopathy.  He underwent catheterization and stenting almost 2 years ago.  In the interim, he notes he is still working. He has class 2 CHF and is still working as a Sport and exercise psychologist. No edema. He has been recovering from pneumonia. ?Allergies  ?Allergen Reactions  ? Morphine And Related Other (See Comments)  ?  hallucinations  ? Penicillins Hives  ?  Can take cefzil ?Has patient had a PCN reaction causing immediate rash, facial/tongue/throat swelling, SOB or lightheadedness with hypotension:unsure ?Has patient had a PCN reaction causing severe rash involving mucus membranes or skin necrosis:unsure ?Has patient had a PCN reaction that required hospitalization:unsure ?Has patient had a PCN reaction occurring within the last 10 years:No ?If all of the above answers are "NO", then may proceed with Cephalosporin use. ?Childhood reaction.  ? Percocet [Oxycodone-Acetaminophen] Other (See Comments)  ?  hallucinations  ? Latex Rash  ? Levaquin [Levofloxacin In D5w] Itching  ? Metformin And Related Other (See Comments)  ?  In high doses, causes diarrhea abdominal bloating   ? Tape Rash  ? ? ? ?Current Outpatient Medications  ?Medication Sig Dispense Refill  ? ALPRAZolam (XANAX) 0.25 MG tablet Take 1 tablet (0.25 mg total) by mouth 2 (two) times daily. 60 tablet 2  ? aspirin EC 81 MG tablet Take 81 mg by mouth daily.    ? buPROPion (WELLBUTRIN SR) 150 MG 12 hr tablet TAKE ONE (1) TABLET BY MOUTH EVERY DAY 90 tablet 1  ? Cholecalciferol 125 MCG (5000 UT) TABS Take 1 tablet by mouth daily.    ? clopidogrel (PLAVIX) 75 MG tablet TAKE ONE (1) TABLET BY MOUTH EVERY DAY 90 tablet 0  ? Coenzyme Q10-Vitamin E (QUNOL ULTRA COQ10 PO) Take 1 tablet by mouth daily.    ? donepezil (ARICEPT) 10 MG tablet TAKE ONE TABLET BY MOUTH EVERY NIGHT AT BEDTIME (Patient taking differently: Take 5 mg by mouth in the  morning.) 90 tablet 0  ? FARXIGA 10 MG TABS tablet Take 10 mg by mouth daily.    ? fexofenadine (ALLEGRA) 180 MG tablet Take 180 mg by mouth daily.    ? HUMALOG KWIKPEN 100 UNIT/ML KiwkPen Inject 0.32-0.38 mLs (32-38 Units total) into the skin 3 (three) times daily. (Patient taking differently: Inject 25 Units into the skin 3 (three) times daily before meals.) 30 pen 2  ? HYDROcodone-acetaminophen (NORCO) 7.5-325 MG tablet Take 0.5-1 tablets by mouth every 12 (twelve) hours as needed for moderate pain.    ? Insulin Detemir (LEVEMIR FLEXTOUCH) 100 UNIT/ML Pen Inject 90 Units into the skin daily at 10 pm. (Patient taking differently: Inject 100 Units into the skin at bedtime.) 30 pen 2  ? Insulin Pen Needle (B-D ULTRAFINE III SHORT PEN) 31G X 8 MM MISC USE 1 NEEDLE 4 TIMES DAILY AS DIRECTED    ? losartan (COZAAR) 25 MG tablet TAKE ONE TABLET ('25MG'$  TOTAL) BY MOUTH DAILY 90 tablet 1  ? metoprolol tartrate (LOPRESSOR) 25 MG tablet Take 1 tablet (25 mg total) by mouth 2 (two) times daily. 180 tablet 3  ? mirabegron ER (MYRBETRIQ) 25 MG TB24 tablet Take 1 tablet (25 mg total) by mouth daily. 30 tablet 11  ? mirtazapine (REMERON) 15 MG tablet Take 1 tablet (15 mg total) by mouth at bedtime. (Patient taking differently: Take 7.5 mg by mouth at bedtime.) 90 tablet 1  ? montelukast (  SINGULAIR) 10 MG tablet Take 1 tablet (10 mg total) by mouth at bedtime. 90 tablet 1  ? nitroGLYCERIN (NITROSTAT) 0.4 MG SL tablet Place 1 tablet (0.4 mg total) under the tongue every 5 (five) minutes as needed for chest pain. 25 tablet 3  ? ONETOUCH ULTRA test strip USE 1 STRIP TO CHECK GLUCOSE 4 TIMES DAILY    ? pantoprazole (PROTONIX) 40 MG tablet TAKE ONE TABLET BY MOUTH TWICE A DAY 180 tablet 3  ? Probiotic Product (ALIGN PO) Take 1 tablet by mouth daily.     ? rosuvastatin (CRESTOR) 40 MG tablet TAKE ONE (1) TABLET BY MOUTH EVERY DAY 90 tablet 2  ? vitamin B-12 (CYANOCOBALAMIN) 1000 MCG tablet Take 6,000 mcg by mouth daily.    ? tadalafil  (CIALIS) 20 MG tablet Take 1 tablet (20 mg total) by mouth daily as needed for erectile dysfunction. (Patient not taking: Reported on 07/20/2021) 6 tablet 6  ? tamsulosin (FLOMAX) 0.4 MG CAPS capsule TAKE 1 CAPSULE BY MOUTH EACH NIGHT (Patient not taking: Reported on 08/04/2021) 30 capsule 11  ? ?No current facility-administered medications for this visit.  ? ? ? ?Past Medical History:  ?Diagnosis Date  ? AICD (automatic cardioverter/defibrillator) present 2005  ? St [name] ICD, for SCD  ? Allergic rhinitis, cause unspecified   ? Anxiety   ? Arthritis   ? "all over" (09/15/2016)  ? ASCVD (arteriosclerotic cardiovascular disease)   ? 70% mid left anterior descending lesion on cath in 06/1995; left anterior desending DES placed in 8/03 and RCA stent in 9/03; captain 3/05 revealed 90% second marginal for which PCI was performed, 70% PDA and a total obstruction of the first diagonal and marginal; sudden cardiac death in [place] in 10/18/03 for which automatic implantable cardiac defibrillator placed; negativ stress nuclear 10/07  ? Atrial fibrillation ([place])   ? Benign prostatic hypertrophy   ? C. difficile diarrhea   ? CAD (coronary artery disease) 1997  ? CHF (congestive heart failure) ([place])   ? Chronic lower back pain   ? Collagen vascular disease ([place])   ? COPD (chronic obstructive pulmonary disease) ([place])   ? Coronary artery disease   ? a. s/p CABG in 2013 with LIMA-LAD, SVG-D1, SVG-OM, and SVG-PDA b. DES to SVG-PDA in 2016 c. DES to mid-RCA in 08/2016 d. cath in 07/2018 showing ISR of RCA stent and treated with balloon angioplasty alone  ? CVD (cerebrovascular disease) 05/2008  ? Transient ischemic attack; carotid ultrasound-plaque without focal disease  ? Degenerative joint disease 2002  ? C-spine fusion   ? Depression   ? Diabetes mellitus without mention of complication   ? Diabetic peripheral neuropathy ([place]) 09/06/2014  ? Erectile dysfunction   ? Esophageal reflux   ? Headache   ? "monthly" (09/15/2016)  ? HOH (hard of  hearing)   ? Hx-TIA (transient ischemic attack) 2010  ? Hyperlipidemia   ? Hypertension   ? Memory deficits 09/05/2013  ? Myocardial infarction [facility]) 1995  ? Other testicular hypofunction   ? Pacemaker   ? S/P endoscopy Dec 2011  ? RMR: nl esophagus, hyperplastic polyp, active gastritis, no H.pylori.   ? Shortness of breath   ? Stroke [facility]) 2014  ? denies residual on 09/15/2016  ? Tobacco abuse   ? 100 pack/year comsuption; cigarettes discontinued 2003; all tobacco products in 2008  ? Tubular adenoma   ? Type II diabetes mellitus ([place])   ? Insulin requirement  ? Vitreous floaters of left eye   ? ? ?  ROS: ? ? All systems reviewed and negative except as noted in the HPI. ? ? ?Past Surgical History:  ?Procedure Laterality Date  ? ANKLE FRACTURE SURGERY Left 09/2006  ? ANTERIOR FUSION CERVICAL SPINE  12/2000  ? BACK SURGERY    ? CARDIAC DEFIBRILLATOR PLACEMENT  11/2003  ? St [name] ICD  ? COLONOSCOPY  2007  ? Dr. Lucio Edward. 34m sessile polyp in desc colon. path unavailable.  ? COLONOSCOPY  11/11/2011  ? Rourk-tubular adenoma sigmoid colon removed, benign segmental biopsies , 2 benign polyps  ? COLONOSCOPY WITH PROPOFOL N/A 05/10/2016  ? Surgeon: RDaneil Dolin MD;  single 6 mm inflammatory type colonic polyp.  Recommended repeat in 5 years.  ? CORONARY ANGIOPLASTY WITH STENT PLACEMENT    ? "I think I have 4 stents"  ? CORONARY ARTERY BYPASS GRAFT  01/10/2012  ? Procedure: CORONARY ARTERY BYPASS GRAFTING (CABG);  Surgeon: SMelrose Nakayama MD;  Location: [facility]  Service: Open Heart Surgery;  Laterality: N/A;  CABG x four; using left internal mammary artery and right leg greater saphenous vein harvested endoscopically  ? CORONARY ATHERECTOMY N/A 09/15/2016  ? Procedure: Coronary Atherectomy;  Surgeon: VJettie Booze MD;  [facility];  Service: Cardiovascular;  Laterality: N/A;  ? CORONARY BALLOON ANGIOPLASTY  07/13/2018  ? CORONARY BALLOON ANGIOPLASTY N/A 07/13/2018  ? Procedure: CORONARY  BALLOON ANGIOPLASTY;  Surgeon: VJettie Booze MD;  [facility];  Service: Cardiovascular;  Laterality: N/A;  ? CORONARY STENT INTERVENTION N/A 09/15/2016  ? Procedure: Coronary Stent Inter

## 2021-08-06 ENCOUNTER — Ambulatory Visit (INDEPENDENT_AMBULATORY_CARE_PROVIDER_SITE_OTHER): Payer: Medicare Other

## 2021-08-06 DIAGNOSIS — I5032 Chronic diastolic (congestive) heart failure: Secondary | ICD-10-CM

## 2021-08-06 LAB — CUP PACEART REMOTE DEVICE CHECK
Battery Remaining Longevity: 0 mo
Battery Voltage: 2.59 V
Brady Statistic AP VP Percent: 1 %
Brady Statistic AP VS Percent: 1 %
Brady Statistic AS VP Percent: 1 %
Brady Statistic AS VS Percent: 99 %
Brady Statistic RA Percent Paced: 1 %
Brady Statistic RV Percent Paced: 1 %
Date Time Interrogation Session: 20230406040015
HighPow Impedance: 55 Ohm
HighPow Impedance: 55 Ohm
Implantable Lead Implant Date: 20050706
Implantable Lead Implant Date: 20161128
Implantable Lead Location: 753859
Implantable Lead Location: 753860
Implantable Lead Model: 7122
Implantable Pulse Generator Implant Date: 20130727
Lead Channel Impedance Value: 380 Ohm
Lead Channel Impedance Value: 410 Ohm
Lead Channel Pacing Threshold Amplitude: 0.75 V
Lead Channel Pacing Threshold Amplitude: 1 V
Lead Channel Pacing Threshold Pulse Width: 0.5 ms
Lead Channel Pacing Threshold Pulse Width: 0.5 ms
Lead Channel Sensing Intrinsic Amplitude: 2.3 mV
Lead Channel Sensing Intrinsic Amplitude: 9.4 mV
Lead Channel Setting Pacing Amplitude: 2 V
Lead Channel Setting Pacing Amplitude: 2.5 V
Lead Channel Setting Pacing Pulse Width: 0.5 ms
Lead Channel Setting Sensing Sensitivity: 0.5 mV
Pulse Gen Serial Number: 7041246

## 2021-08-19 ENCOUNTER — Other Ambulatory Visit: Payer: Self-pay | Admitting: Student

## 2021-08-19 ENCOUNTER — Other Ambulatory Visit: Payer: Self-pay | Admitting: Family Medicine

## 2021-08-21 NOTE — Progress Notes (Signed)
Remote ICD transmission.   

## 2021-09-03 ENCOUNTER — Ambulatory Visit: Payer: Medicare Other

## 2021-09-03 ENCOUNTER — Other Ambulatory Visit: Payer: Self-pay | Admitting: Family Medicine

## 2021-09-09 ENCOUNTER — Telehealth: Payer: Self-pay | Admitting: Internal Medicine

## 2021-09-09 ENCOUNTER — Ambulatory Visit: Payer: Medicare Other | Admitting: Internal Medicine

## 2021-09-09 ENCOUNTER — Other Ambulatory Visit (HOSPITAL_COMMUNITY)
Admission: RE | Admit: 2021-09-09 | Discharge: 2021-09-09 | Disposition: A | Discharge status: Home / Self Care | Payer: Medicare Other | Source: Ambulatory Visit | Attending: Internal Medicine | Admitting: Internal Medicine

## 2021-09-09 DIAGNOSIS — Z95810 Presence of automatic (implantable) cardiac defibrillator: Secondary | ICD-10-CM

## 2021-09-09 LAB — BASIC METABOLIC PANEL
Anion gap: 7 (ref 5–15)
BUN: 12 mg/dL (ref 8–23)
CO2: 25 mmol/L (ref 22–32)
Calcium: 9.1 mg/dL (ref 8.9–10.3)
Chloride: 109 mmol/L (ref 98–111)
Creatinine, Ser: 0.96 mg/dL (ref 0.61–1.24)
GFR, Estimated: >60 mL/min (ref >60)
Glucose, Bld: 144 mg/dL — ABNORMAL HIGH (ref 70–99)
Potassium: 4 mmol/L (ref 3.5–5.1)
Sodium: 141 mmol/L (ref 135–145)

## 2021-09-09 LAB — CBC
HCT: 40.9 % (ref 39.0–52.0)
Hemoglobin: 13.7 g/dL (ref 13.0–17.0)
MCH: 28.8 pg (ref 26.0–34.0)
MCHC: 33.5 g/dL (ref 30.0–36.0)
MCV: 86.1 fL (ref 80.0–100.0)
Platelets: 173 10*3/uL (ref 150–400)
RBC: 4.75 MIL/uL (ref 4.22–5.81)
RDW: 14.1 % (ref 11.5–15.5)
WBC: 6.8 10*3/uL (ref 4.0–10.5)
nRBC: 0 % (ref 0.0–0.2)

## 2021-09-09 NOTE — Telephone Encounter (Signed)
Completed. Pt notified.  ?

## 2021-09-09 NOTE — Telephone Encounter (Signed)
Patient walked into the office stating he's supposed to have labs done for his gen change and there's not any orders in.  ? ?Pt is currently waiting in the lobby.  ?

## 2021-09-14 DIAGNOSIS — G894 Chronic pain syndrome: Secondary | ICD-10-CM | POA: Diagnosis not present

## 2021-09-14 DIAGNOSIS — M542 Cervicalgia: Secondary | ICD-10-CM | POA: Diagnosis not present

## 2021-09-14 DIAGNOSIS — M47892 Other spondylosis, cervical region: Secondary | ICD-10-CM | POA: Diagnosis not present

## 2021-09-16 ENCOUNTER — Telehealth: Payer: Self-pay

## 2021-09-16 MED ORDER — ROSUVASTATIN CALCIUM 40 MG PO TABS: ORAL_TABLET | ORAL | 2 refills | Status: DC

## 2021-09-16 NOTE — Telephone Encounter (Signed)
Medication refill request for Crestor 40 mg tablets approved and sent to the pharmacy. ?

## 2021-09-17 ENCOUNTER — Other Ambulatory Visit: Payer: Self-pay

## 2021-09-17 ENCOUNTER — Ambulatory Visit (HOSPITAL_COMMUNITY)
Admission: RE | Admit: 2021-09-17 | Discharge: 2021-09-17 | Disposition: A | Discharge status: Home / Self Care | Payer: Medicare Other | Attending: Internal Medicine | Admitting: Internal Medicine

## 2021-09-17 ENCOUNTER — Encounter (HOSPITAL_COMMUNITY)
Admission: RE | Disposition: A | Discharge status: Home / Self Care | Payer: Medicare Other | Source: Home / Self Care | Attending: Internal Medicine

## 2021-09-17 DIAGNOSIS — I110 Hypertensive heart disease with heart failure: Secondary | ICD-10-CM | POA: Insufficient documentation

## 2021-09-17 DIAGNOSIS — I5022 Chronic systolic (congestive) heart failure: Secondary | ICD-10-CM | POA: Insufficient documentation

## 2021-09-17 DIAGNOSIS — E1140 Type 2 diabetes mellitus with diabetic neuropathy, unspecified: Secondary | ICD-10-CM | POA: Diagnosis not present

## 2021-09-17 DIAGNOSIS — Z794 Long term (current) use of insulin: Secondary | ICD-10-CM | POA: Diagnosis not present

## 2021-09-17 DIAGNOSIS — I472 Ventricular tachycardia, unspecified: Secondary | ICD-10-CM | POA: Insufficient documentation

## 2021-09-17 DIAGNOSIS — J449 Chronic obstructive pulmonary disease, unspecified: Secondary | ICD-10-CM | POA: Diagnosis not present

## 2021-09-17 DIAGNOSIS — I2510 Atherosclerotic heart disease of native coronary artery without angina pectoris: Secondary | ICD-10-CM | POA: Insufficient documentation

## 2021-09-17 DIAGNOSIS — T82118D Breakdown (mechanical) of other cardiac electronic device, subsequent encounter: Secondary | ICD-10-CM

## 2021-09-17 DIAGNOSIS — I255 Ischemic cardiomyopathy: Secondary | ICD-10-CM | POA: Diagnosis not present

## 2021-09-17 DIAGNOSIS — Z955 Presence of coronary angioplasty implant and graft: Secondary | ICD-10-CM | POA: Insufficient documentation

## 2021-09-17 DIAGNOSIS — J189 Pneumonia, unspecified organism: Secondary | ICD-10-CM | POA: Insufficient documentation

## 2021-09-17 DIAGNOSIS — Z4502 Encounter for adjustment and management of automatic implantable cardiac defibrillator: Secondary | ICD-10-CM | POA: Diagnosis not present

## 2021-09-17 DIAGNOSIS — Z7984 Long term (current) use of oral hypoglycemic drugs: Secondary | ICD-10-CM | POA: Diagnosis not present

## 2021-09-17 DIAGNOSIS — Z87891 Personal history of nicotine dependence: Secondary | ICD-10-CM | POA: Diagnosis not present

## 2021-09-17 HISTORY — PX: ICD GENERATOR CHANGEOUT: EP1231

## 2021-09-17 LAB — GLUCOSE, CAPILLARY: Glucose-Capillary: 159 mg/dL — ABNORMAL HIGH (ref 70–99)

## 2021-09-17 SURGERY — ICD GENERATOR CHANGEOUT

## 2021-09-17 MED ORDER — SODIUM CHLORIDE 0.9 % IV SOLN: INTRAVENOUS | Status: DC

## 2021-09-17 MED ORDER — MIDAZOLAM HCL 5 MG/5ML IJ SOLN
INTRAMUSCULAR | Status: DC | PRN
Start: 2021-09-17 — End: 2021-09-17
  Administered 2021-09-17: 2 mg via INTRAVENOUS

## 2021-09-17 MED ORDER — ONDANSETRON HCL 4 MG/2ML IJ SOLN: 4.0000 mg | Freq: Four times a day (QID) | INTRAMUSCULAR | Status: DC | PRN

## 2021-09-17 MED ORDER — FENTANYL CITRATE (PF) 100 MCG/2ML IJ SOLN
INTRAMUSCULAR | Status: AC
  Filled 2021-09-17: qty 2

## 2021-09-17 MED ORDER — SODIUM CHLORIDE 0.9 % IV SOLN
INTRAVENOUS | Status: AC
  Filled 2021-09-17: qty 2

## 2021-09-17 MED ORDER — CHLORHEXIDINE GLUCONATE 4 % EX LIQD
4.0000 "application " | Freq: Once | CUTANEOUS | Status: DC
  Filled 2021-09-17: qty 60

## 2021-09-17 MED ORDER — SODIUM CHLORIDE 0.9 % IV SOLN
80.0000 mg | INTRAVENOUS | Status: AC
  Administered 2021-09-17: 80 mg
  Filled 2021-09-17: qty 2

## 2021-09-17 MED ORDER — ACETAMINOPHEN 325 MG PO TABS
325.0000 mg | ORAL_TABLET | ORAL | Status: DC | PRN
  Filled 2021-09-17: qty 2

## 2021-09-17 MED ORDER — FENTANYL CITRATE (PF) 100 MCG/2ML IJ SOLN
INTRAMUSCULAR | Status: DC | PRN
  Administered 2021-09-17: 25 ug via INTRAVENOUS

## 2021-09-17 MED ORDER — VANCOMYCIN HCL IN DEXTROSE 1-5 GM/200ML-% IV SOLN
INTRAVENOUS | Status: AC
  Filled 2021-09-17: qty 200

## 2021-09-17 MED ORDER — VANCOMYCIN HCL IN DEXTROSE 1-5 GM/200ML-% IV SOLN
1000.0000 mg | INTRAVENOUS | Status: AC
  Administered 2021-09-17: 1000 mg via INTRAVENOUS
  Filled 2021-09-17: qty 200

## 2021-09-17 MED ORDER — LIDOCAINE HCL 1 % IJ SOLN
INTRAMUSCULAR | Status: AC
  Filled 2021-09-17: qty 60

## 2021-09-17 MED ORDER — LIDOCAINE HCL (PF) 1 % IJ SOLN
INTRAMUSCULAR | Status: DC | PRN
  Administered 2021-09-17: 60 mL

## 2021-09-17 MED ORDER — MIDAZOLAM HCL 5 MG/5ML IJ SOLN
INTRAMUSCULAR | Status: AC
  Filled 2021-09-17: qty 5

## 2021-09-17 MED ORDER — POVIDONE-IODINE 10 % EX SWAB
2.0000 "application " | Freq: Once | CUTANEOUS | Status: AC
  Administered 2021-09-17: 2 via TOPICAL

## 2021-09-17 SURGICAL SUPPLY — 4 items
CABLE SURGICAL S-101-97-12 (CABLE) ×2 IMPLANT
ICD ELLIPSE DR CD2411-36C (ICD Generator) ×1 IMPLANT
PAD DEFIB RADIO PHYSIO CONN (PAD) ×2 IMPLANT
TRAY PACEMAKER INSERTION (PACKS) ×2 IMPLANT
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
## 2021-09-17 NOTE — Discharge Instructions (Addendum)

## 2021-09-18 ENCOUNTER — Encounter (HOSPITAL_COMMUNITY): Payer: Self-pay | Admitting: Internal Medicine

## 2021-09-18 MED FILL — Gentamicin Sulfate Inj 40 MG/ML: INTRAMUSCULAR | Qty: 80 | Status: AC

## 2021-10-01 ENCOUNTER — Ambulatory Visit: Payer: Medicare Other

## 2021-10-01 DIAGNOSIS — I4901 Ventricular fibrillation: Secondary | ICD-10-CM

## 2021-10-01 LAB — CUP PACEART INCLINIC DEVICE CHECK
Battery Remaining Longevity: 102 mo
Brady Statistic RA Percent Paced: 0.13 %
Brady Statistic RV Percent Paced: 0.13 %
Date Time Interrogation Session: 20230601110400
HighPow Impedance: 47.25 Ohm
Implantable Lead Implant Date: 20050706
Implantable Lead Implant Date: 20161128
Implantable Lead Location: 753859
Implantable Lead Location: 753860
Implantable Lead Model: 7122
Implantable Pulse Generator Implant Date: 20230518
Lead Channel Impedance Value: 350 Ohm
Lead Channel Impedance Value: 375 Ohm
Lead Channel Pacing Threshold Amplitude: 0.75 V
Lead Channel Pacing Threshold Amplitude: 1 V
Lead Channel Pacing Threshold Pulse Width: 0.5 ms
Lead Channel Pacing Threshold Pulse Width: 0.5 ms
Lead Channel Sensing Intrinsic Amplitude: 3.4 mV
Lead Channel Sensing Intrinsic Amplitude: 8.1 mV
Lead Channel Setting Pacing Amplitude: 2 V
Lead Channel Setting Pacing Amplitude: 2.5 V
Lead Channel Setting Pacing Pulse Width: 0.5 ms
Lead Channel Setting Sensing Sensitivity: 0.5 mV
Pulse Gen Serial Number: 8942717

## 2021-10-01 MED ORDER — DOXYCYCLINE HYCLATE 100 MG PO CAPS: 100.0000 mg | ORAL_CAPSULE | Freq: Two times a day (BID) | ORAL | 0 refills | Status: AC

## 2021-10-01 MED ORDER — DOXYCYCLINE HYCLATE 100 MG PO TABS: 100.0000 mg | ORAL_TABLET | Freq: Two times a day (BID) | ORAL | Status: DC

## 2021-10-01 NOTE — Patient Instructions (Addendum)
   After Your ICD (Implantable Cardiac Defibrillator)    Monitor your defibrillator site for redness, swelling, and drainage. Call the device clinic at [phone] if you experience these symptoms or fever/chills.  Your incision was closed with Steri-strips or staples:  Please keep your incision uncovered and dry. If it begins to drain call the device clinic immediately. Avoid lotions, ointments, or perfumes over your incision until it is well-healed. Take all antibiotic dosages as prescribed.    Your ICD is not MRI compatible.  Your ICD is designed to protect you from life threatening heart rhythms. Because of this, you may receive a shock.   1 shock with no symptoms:  Call the office during business hours. 1 shock with symptoms (chest pain, chest pressure, dizziness, lightheadedness, shortness of breath, overall feeling unwell):  Call 911. If you experience 2 or more shocks in 24 hours:  Call 911. If you receive a shock, you should not drive.  [place] DMV - no driving for 6 months if you receive appropriate therapy from your ICD.   ICD Alerts:  Some alerts are vibratory and others beep. These are NOT emergencies. Please call our office to let us know. If this occurs at night or on weekends, it can wait until the next business day. Send a remote transmission.  If your device is capable of reading fluid status (for heart failure), you will be offered monthly monitoring to review this with you.   Remote monitoring is used to monitor your ICD from home. This monitoring is scheduled every 91 days by our office. It allows [place] to keep an eye on the functioning of your device to ensure it is working properly. You will routinely see your Electrophysiologist annually (more often if necessary).

## 2021-10-01 NOTE — Progress Notes (Signed)
Wound check appointment. Steri-strips removed. Wound without redness or edema. Small open area mid incision with scant amt of serosang drainage. Small hematoma at site. Dr. Curt Bears in to assess. Doxy '100mg'$  BID x 7 days ordered. Normal device function. Thresholds, sensing, and impedances consistent with implant measurements. Device programmed at chronic lead settings.  Histogram distribution appropriate for patient and level of activity. No mode switches or ventricular arrhythmias noted. Patient educated about wound care, arm mobility, lifting restrictions, shock plan. Patient enrolled in remote monitoring with next transmission scheduled 12/17/21. Patient will return to for wound check 10/08/21 at [facility]. 91 day follow up with Dr. Lovena Le 12/28/21.

## 2021-10-08 ENCOUNTER — Ambulatory Visit: Payer: Medicare Other

## 2021-10-08 DIAGNOSIS — Z95810 Presence of automatic (implantable) cardiac defibrillator: Secondary | ICD-10-CM

## 2021-10-08 NOTE — Progress Notes (Signed)
Wound re-check see media for picture, no redness or bruising noted, incision site approximated, site fluctuant to touch per patient this is resolving, educated patient and patients wife to call for increased swelling, new bruising fever or chills patient and patients wife voiced understanding.

## 2021-10-14 ENCOUNTER — Ambulatory Visit: Payer: Medicare Other

## 2021-10-14 ENCOUNTER — Telehealth: Payer: Self-pay | Admitting: Internal Medicine

## 2021-10-14 NOTE — Telephone Encounter (Signed)
  1. Has your device fired? no  2. Is you device beeping? no  3. Are you experiencing draining or swelling at device site? Patient incision site has come open but is not draining, it's a little red. He has place to Band-Aids to hold it together.   4. Are you calling to see if we received your device transmission? no  5. Have you passed out? no    Please route to [place]

## 2021-10-14 NOTE — Telephone Encounter (Signed)
Spoke with patient, patient stated that he could not come to [place] today stated he could take a picture and email it, email address given.

## 2021-10-14 NOTE — Telephone Encounter (Signed)
Picture received, patient agreeable to in clinic apt at 2:00pm for GT to assess wound.

## 2021-10-15 ENCOUNTER — Other Ambulatory Visit: Payer: Self-pay | Admitting: Family Medicine

## 2021-10-27 ENCOUNTER — Telehealth: Payer: Self-pay | Admitting: Family Medicine

## 2021-11-05 ENCOUNTER — Other Ambulatory Visit: Payer: Self-pay | Admitting: Family Medicine

## 2021-11-05 ENCOUNTER — Encounter: Payer: Self-pay | Admitting: Internal Medicine

## 2021-11-16 ENCOUNTER — Other Ambulatory Visit: Payer: Self-pay | Admitting: Family Medicine

## 2021-11-17 DIAGNOSIS — G894 Chronic pain syndrome: Secondary | ICD-10-CM | POA: Diagnosis not present

## 2021-11-17 DIAGNOSIS — I10 Essential (primary) hypertension: Secondary | ICD-10-CM | POA: Diagnosis not present

## 2021-11-17 DIAGNOSIS — M542 Cervicalgia: Secondary | ICD-10-CM | POA: Diagnosis not present

## 2021-11-24 DIAGNOSIS — I2510 Atherosclerotic heart disease of native coronary artery without angina pectoris: Secondary | ICD-10-CM | POA: Diagnosis not present

## 2021-11-24 DIAGNOSIS — Z794 Long term (current) use of insulin: Secondary | ICD-10-CM | POA: Diagnosis not present

## 2021-11-24 DIAGNOSIS — E1142 Type 2 diabetes mellitus with diabetic polyneuropathy: Secondary | ICD-10-CM | POA: Diagnosis not present

## 2021-12-03 ENCOUNTER — Ambulatory Visit (INDEPENDENT_AMBULATORY_CARE_PROVIDER_SITE_OTHER): Payer: Medicare Other | Admitting: Family Medicine

## 2021-12-03 ENCOUNTER — Encounter: Payer: Self-pay | Admitting: Family Medicine

## 2021-12-03 VITALS — BP 122/71 | Wt 204.2 lb

## 2021-12-03 DIAGNOSIS — R2689 Other abnormalities of gait and mobility: Secondary | ICD-10-CM

## 2021-12-03 DIAGNOSIS — R911 Solitary pulmonary nodule: Secondary | ICD-10-CM

## 2021-12-03 DIAGNOSIS — R634 Abnormal weight loss: Secondary | ICD-10-CM

## 2021-12-03 DIAGNOSIS — R1011 Right upper quadrant pain: Secondary | ICD-10-CM | POA: Diagnosis not present

## 2021-12-03 DIAGNOSIS — R413 Other amnesia: Secondary | ICD-10-CM

## 2021-12-03 DIAGNOSIS — R6881 Early satiety: Secondary | ICD-10-CM | POA: Diagnosis not present

## 2021-12-03 MED ORDER — SERTRALINE HCL 50 MG PO TABS: 50.0000 mg | ORAL_TABLET | Freq: Every day | ORAL | 3 refills | Status: DC

## 2021-12-03 MED ORDER — ALPRAZOLAM 0.5 MG PO TABS: 0.5000 mg | ORAL_TABLET | Freq: Two times a day (BID) | ORAL | 2 refills | Status: DC

## 2021-12-03 NOTE — Progress Notes (Signed)
Subjective:    Patient ID: Lance [name], male    DOB: 1946-03-09, 76 y.o.   MRN: [id number]  HPI Pt arrives today due to fatigue, forgetfulness, decreased appetite, shakiness in legs, nervous inside and irritable.  RUQ abdominal pain - Plan: CT Abdomen Pelvis W Contrast  Early satiety - Plan: Vitamin B12, Hepatic function panel, Lipase, Basic metabolic panel, CBC with Differential  Weight loss - Plan: CT Abdomen Pelvis W Contrast, Vitamin B12, Hepatic function panel, Lipase, Basic metabolic panel, CBC with Differential  Poor balance - Plan: Vitamin B12, Hepatic function panel, Lipase, Basic metabolic panel, CBC with Differential  Poor memory - Plan: Vitamin B12, Hepatic function panel, Lipase, Basic metabolic panel, CBC with Differential  Pulmonary nodule, right - Plan: CT CHEST LUNG CA SCREEN LOW DOSE W/O CM  Right upper quadrant abdominal pain - Plan: CT Abdomen Pelvis W Contrast See discussion below He relates that his appetite is poor Under a lot of stress Tries to do a decent job of eating But states he gets full quickly This been previously worked up by gastroenterology But now he is having some intermittent right upper quadrant pain and discomfort along with 25 to 30 pound weight loss over the past 6 months Denies rectal bleeding  He also relates difficulty with his memory having a hard time focusing and staying on track and also relates finding himself having intermittent times of having a hard time remembering what he was supposed to do he also states at times he feels unsteady when he walks Pt states this has been going on but has worsened in the past 3 months.     Review of Systems     Objective:   Physical Exam  General-in no acute distress Eyes-no discharge Lungs-respiratory rate normal, CTA CV-no murmurs,RRR Extremities skin warm dry no edema Neuro grossly normal Behavior normal, alert Patient able to ambulate within the room no obvious unilateral  numbness weakness on exam      Assessment & Plan:  1. RUQ abdominal pain Intermittent right upper quadrant pain and discomfort coupled with 30 pound weight loss over the past 6 months coupled with poor appetite early satiety needs further evaluation with scan - CT Abdomen Pelvis W Contrast  2. Early satiety Please see discussion above recommend small meals frequently if scan negative it is possible stress could be playing a role.  It is also possible could be gastroparesis may need gastric emptying study if ongoing troubles has been worked up previously in the past with endoscopy - Vitamin B12 - Hepatic function panel - Lipase - Basic metabolic panel - CBC with Differential  3. Weight loss Unintentional weight loss patient states appetite not quite as good as what it used to be.  Relates he finds himself at times stress.  But at the same time finds himself losing weight without trying. Patient with intermittent epigastric and right upper quadrant pain - CT Abdomen Pelvis W Contrast - Vitamin B12 - Hepatic function panel - Lipase - Basic metabolic panel - CBC with Differential  4. Poor balance Patient relates balance is getting off could be related to diabetes.  Could be related to weight loss and weakness.  I do not find any evidence of stroke going on - Vitamin B12 - Hepatic function panel - Lipase - Basic metabolic panel - CBC with Differential  5. Poor memory Instantaneous recall 3 for 3, clock drawing good, delayed recall 2 for 3.  Passes - Vitamin B12 - Hepatic function  panel - Lipase - Basic metabolic panel - CBC with Differential  6. Pulmonary nodule, right Patient had pulmonary nodule present on the CAT scan back in 2018 and is recommended to do a follow-up scan.  Patient was unable to get follow-up scan completed because of undetermined reasons therefore go ahead with setting up CT scan - CT CHEST LUNG CA SCREEN LOW DOSE W/O CM  7. Right upper quadrant  abdominal pain Scan as discussed - CT Abdomen Pelvis W Contrast  Stress related issues-patient having significant stress related issues we did discuss various approaches to this we will stop mirtazapine stop Wellbutrin.  Start sertraline.  In addition to this alprazolam bump up the dose to 0.5 mg 1 twice daily.  Patient states he uses hydrocodone sparingly.  I did advise him no driving if feeling drowsy.  Recheck in 1 month

## 2021-12-04 LAB — CBC WITH DIFFERENTIAL/PLATELET
Basophils Absolute: 0 x10E3/uL (ref 0.0–0.2)
Basos: 1 %
EOS (ABSOLUTE): 0.1 x10E3/uL (ref 0.0–0.4)
Eos: 2 %
Hematocrit: 43.3 % (ref 37.5–51.0)
Hemoglobin: 14.3 g/dL (ref 13.0–17.7)
Immature Grans (Abs): 0 x10E3/uL (ref 0.0–0.1)
Immature Granulocytes: 0 %
Lymphocytes Absolute: 2.1 x10E3/uL (ref 0.7–3.1)
Lymphs: 35 %
MCH: 27.9 pg (ref 26.6–33.0)
MCHC: 33 g/dL (ref 31.5–35.7)
MCV: 85 fL (ref 79–97)
Monocytes Absolute: 0.6 x10E3/uL (ref 0.1–0.9)
Monocytes: 11 %
Neutrophils Absolute: 3 x10E3/uL (ref 1.4–7.0)
Neutrophils: 51 %
Platelets: 147 x10E3/uL — ABNORMAL LOW (ref 150–450)
RBC: 5.12 x10E6/uL (ref 4.14–5.80)
RDW: 14.4 % (ref 11.6–15.4)
WBC: 5.9 x10E3/uL (ref 3.4–10.8)

## 2021-12-04 LAB — HEPATIC FUNCTION PANEL
ALT: 17 IU/L (ref 0–44)
AST: 20 IU/L (ref 0–40)
Albumin: 4.3 g/dL (ref 3.8–4.8)
Alkaline Phosphatase: 81 IU/L (ref 44–121)
Bilirubin Total: 0.2 mg/dL (ref 0.0–1.2)
Bilirubin, Direct: <0.1 mg/dL (ref 0.00–0.40)
Total Protein: 6.6 g/dL (ref 6.0–8.5)

## 2021-12-04 LAB — BASIC METABOLIC PANEL
BUN/Creatinine Ratio: 10 (ref 10–24)
BUN: 10 mg/dL (ref 8–27)
CO2: 22 mmol/L (ref 20–29)
Calcium: 9.2 mg/dL (ref 8.6–10.2)
Chloride: 107 mmol/L — ABNORMAL HIGH (ref 96–106)
Creatinine, Ser: 1.03 mg/dL (ref 0.76–1.27)
Glucose: 198 mg/dL — ABNORMAL HIGH (ref 70–99)
Potassium: 4.2 mmol/L (ref 3.5–5.2)
Sodium: 143 mmol/L (ref 134–144)
eGFR: 77 mL/min/{1.73_m2} (ref >59)

## 2021-12-04 LAB — VITAMIN B12: Vitamin B-12: 1590 pg/mL — ABNORMAL HIGH (ref 232–1245)

## 2021-12-04 LAB — LIPASE: Lipase: 35 U/L (ref 13–78)

## 2021-12-04 NOTE — Progress Notes (Signed)
12/04/21-reordered CT scan

## 2021-12-04 NOTE — Addendum Note (Signed)
Addended by: Vicente Males on: 12/04/2021 10:09 AM   Modules accepted: Orders

## 2021-12-06 ENCOUNTER — Other Ambulatory Visit: Payer: Self-pay | Admitting: Family Medicine

## 2021-12-09 DIAGNOSIS — E113593 Type 2 diabetes mellitus with proliferative diabetic retinopathy without macular edema, bilateral: Secondary | ICD-10-CM | POA: Diagnosis not present

## 2021-12-12 ENCOUNTER — Other Ambulatory Visit: Payer: Self-pay | Admitting: Family Medicine

## 2021-12-13 ENCOUNTER — Other Ambulatory Visit: Payer: Self-pay | Admitting: Family Medicine

## 2021-12-17 ENCOUNTER — Ambulatory Visit (INDEPENDENT_AMBULATORY_CARE_PROVIDER_SITE_OTHER): Payer: Medicare Other

## 2021-12-17 DIAGNOSIS — Z95810 Presence of automatic (implantable) cardiac defibrillator: Secondary | ICD-10-CM | POA: Diagnosis not present

## 2021-12-17 DIAGNOSIS — I255 Ischemic cardiomyopathy: Secondary | ICD-10-CM

## 2021-12-17 LAB — CUP PACEART REMOTE DEVICE CHECK
Battery Remaining Longevity: 94 mo
Battery Remaining Percentage: 95 %
Battery Voltage: 3.2 V
Brady Statistic AP VP Percent: 1 %
Brady Statistic AP VS Percent: 1 %
Brady Statistic AS VP Percent: 1 %
Brady Statistic AS VS Percent: 99 %
Brady Statistic RA Percent Paced: 1 %
Brady Statistic RV Percent Paced: 1 %
Date Time Interrogation Session: 20230817020022
HighPow Impedance: 55 Ohm
HighPow Impedance: 55 Ohm
Implantable Lead Implant Date: 20050706
Implantable Lead Implant Date: 20161128
Implantable Lead Location: 753859
Implantable Lead Location: 753860
Implantable Lead Model: 7122
Implantable Pulse Generator Implant Date: 20230518
Lead Channel Impedance Value: 340 Ohm
Lead Channel Impedance Value: 380 Ohm
Lead Channel Pacing Threshold Amplitude: 0.75 V
Lead Channel Pacing Threshold Amplitude: 1 V
Lead Channel Pacing Threshold Pulse Width: 0.5 ms
Lead Channel Pacing Threshold Pulse Width: 0.5 ms
Lead Channel Sensing Intrinsic Amplitude: 2.8 mV
Lead Channel Sensing Intrinsic Amplitude: 8.3 mV
Lead Channel Setting Pacing Amplitude: 2 V
Lead Channel Setting Pacing Amplitude: 2.5 V
Lead Channel Setting Pacing Pulse Width: 0.5 ms
Lead Channel Setting Sensing Sensitivity: 0.5 mV
Pulse Gen Serial Number: 8942717

## 2021-12-23 ENCOUNTER — Ambulatory Visit: Payer: Medicare Other | Admitting: Urology

## 2021-12-23 ENCOUNTER — Ambulatory Visit: Payer: Medicare Other | Admitting: Cardiology

## 2021-12-23 ENCOUNTER — Encounter: Payer: Self-pay | Admitting: Urology

## 2021-12-23 ENCOUNTER — Encounter: Payer: Self-pay | Admitting: Cardiology

## 2021-12-23 VITALS — BP 118/72 | HR 62 | Ht 69.0 in | Wt 200.0 lb

## 2021-12-23 VITALS — BP 120/64 | HR 63 | Ht 69.0 in | Wt 199.6 lb

## 2021-12-23 DIAGNOSIS — R350 Frequency of micturition: Secondary | ICD-10-CM

## 2021-12-23 DIAGNOSIS — I2510 Atherosclerotic heart disease of native coronary artery without angina pectoris: Secondary | ICD-10-CM

## 2021-12-23 DIAGNOSIS — N529 Male erectile dysfunction, unspecified: Secondary | ICD-10-CM | POA: Diagnosis not present

## 2021-12-23 DIAGNOSIS — I10 Essential (primary) hypertension: Secondary | ICD-10-CM

## 2021-12-23 DIAGNOSIS — N401 Enlarged prostate with lower urinary tract symptoms: Secondary | ICD-10-CM | POA: Diagnosis not present

## 2021-12-23 DIAGNOSIS — E782 Mixed hyperlipidemia: Secondary | ICD-10-CM | POA: Diagnosis not present

## 2021-12-23 LAB — BLADDER SCAN AMB NON-IMAGING: Scan Result: 0

## 2021-12-23 MED ORDER — EZETIMIBE 10 MG PO TABS: 10.0000 mg | ORAL_TABLET | Freq: Every day | ORAL | 3 refills | Status: DC

## 2021-12-23 MED ORDER — METOPROLOL TARTRATE 25 MG PO TABS: 12.5000 mg | ORAL_TABLET | Freq: Two times a day (BID) | ORAL | 3 refills | Status: DC

## 2021-12-23 MED ORDER — ALFUZOSIN HCL ER 10 MG PO TB24: 10.0000 mg | ORAL_TABLET | Freq: Every day | ORAL | 11 refills | Status: DC
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
## 2021-12-23 NOTE — Progress Notes (Signed)
post void residual =0mL 
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
## 2021-12-23 NOTE — Patient Instructions (Signed)
Medication Instructions:  Your physician has recommended you make the following change in your medication:  -Start Zetia 10 mg tablets daily - Decrease Lopressor to 12.5 mg tablets twice daily   Labwork: None  Testing/Procedures: None  Follow-Up: Follow up with Dr. Harl Bowie in 4 months.   Any Other Special Instructions Will Be Listed Below (If Applicable).     If you need a refill on your cardiac medications before your next appointment, please call your pharmacy.

## 2021-12-24 ENCOUNTER — Encounter: Payer: Self-pay | Admitting: Internal Medicine

## 2021-12-24 ENCOUNTER — Ambulatory Visit (INDEPENDENT_AMBULATORY_CARE_PROVIDER_SITE_OTHER): Payer: Medicare Other | Admitting: Internal Medicine

## 2021-12-24 DIAGNOSIS — I509 Heart failure, unspecified: Secondary | ICD-10-CM | POA: Diagnosis not present

## 2021-12-24 DIAGNOSIS — I255 Ischemic cardiomyopathy: Secondary | ICD-10-CM

## 2021-12-24 LAB — CUP PACEART INCLINIC DEVICE CHECK
Battery Remaining Longevity: 100 mo
Brady Statistic RA Percent Paced: 0.08 %
Brady Statistic RV Percent Paced: 0.22 %
Date Time Interrogation Session: 20230824104231
HighPow Impedance: 55.125
Implantable Lead Implant Date: 20050706
Implantable Lead Implant Date: 20161128
Implantable Lead Location: 753859
Implantable Lead Location: 753860
Implantable Lead Model: 7122
Implantable Pulse Generator Implant Date: 20230518
Lead Channel Impedance Value: 350 Ohm
Lead Channel Impedance Value: 412.5 Ohm
Lead Channel Pacing Threshold Amplitude: 0.75 V
Lead Channel Pacing Threshold Amplitude: 0.75 V
Lead Channel Pacing Threshold Amplitude: 1 V
Lead Channel Pacing Threshold Amplitude: 1 V
Lead Channel Pacing Threshold Pulse Width: 0.5 ms
Lead Channel Pacing Threshold Pulse Width: 0.5 ms
Lead Channel Pacing Threshold Pulse Width: 0.5 ms
Lead Channel Pacing Threshold Pulse Width: 0.5 ms
Lead Channel Sensing Intrinsic Amplitude: 3.5 mV
Lead Channel Sensing Intrinsic Amplitude: 9.6 mV
Lead Channel Setting Pacing Amplitude: 2 V
Lead Channel Setting Pacing Amplitude: 2.5 V
Lead Channel Setting Pacing Pulse Width: 0.5 ms
Lead Channel Setting Sensing Sensitivity: 0.5 mV
Pulse Gen Serial Number: 8942717

## 2021-12-24 LAB — URINALYSIS, ROUTINE W REFLEX MICROSCOPIC
Bilirubin, UA: NEGATIVE
Leukocytes,UA: NEGATIVE
Nitrite, UA: NEGATIVE
Protein,UA: NEGATIVE
RBC, UA: NEGATIVE
Specific Gravity, UA: 1.01 (ref 1.005–1.030)
Urobilinogen, Ur: 1 mg/dL (ref 0.2–1.0)
pH, UA: 7 (ref 5.0–7.5)

## 2021-12-24 NOTE — Patient Instructions (Signed)
Medication Instructions:  Your physician recommends that you continue on your current medications as directed. Please refer to the Current Medication list given to you today.  *If you need a refill on your cardiac medications before your next appointment, please call your pharmacy*   Lab Work: NONE   If you have labs (blood work) drawn today and your tests are completely normal, you will receive your results only by: MyChart Message (if you have MyChart) OR A paper copy in the mail If you have any lab test that is abnormal or we need to change your treatment, we will call you to review the results.   Testing/Procedures: NONE    Follow-Up: At [facility], you and your health needs are our priority.  As part of our continuing mission to provide you with exceptional heart care, we have created designated Provider Care Teams.  These Care Teams include your primary Cardiologist (physician) and Advanced Practice Providers (APPs -  Physician Assistants and Nurse Practitioners) who all work together to provide you with the care you need, when you need it.  We recommend signing up for the patient portal called "MyChart".  Sign up information is provided on this After Visit Summary.  MyChart is used to connect with patients for Virtual Visits (Telemedicine).  Patients are able to view lab/test results, encounter notes, upcoming appointments, etc.  Non-urgent messages can be sent to your provider as well.   To learn more about what you can do with MyChart, go to [email or web address].    Your next appointment:   1 year(s)  The format for your next appointment:   In Person  Provider:   Gregg Taylor, MD    Other Instructions Thank you for choosing [facility]!    Important Information About Sugar       
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
## 2021-12-30 ENCOUNTER — Telehealth: Payer: Self-pay | Admitting: Cardiology

## 2021-12-30 ENCOUNTER — Encounter: Payer: Self-pay | Admitting: Internal Medicine

## 2021-12-30 NOTE — Telephone Encounter (Signed)
Error

## 2021-12-30 NOTE — Telephone Encounter (Addendum)
Last seen 12/23/21 by Dr.Branch   I will forward to [place] for review

## 2021-12-30 NOTE — Telephone Encounter (Signed)
Pt c/o BP issue: STAT if pt c/o blurred vision, one-sided weakness or slurred speech  1. What are your last 5 BP readings?  Yesterday morning: 179/92 Yesterday morning: 182/97 hr 88 After medicine yesterday : 176/80  hr 84  Last night: 149/84  hr 82  Last night: 139/79  hr 76  This morning: 143/78  hr 62     2. Are you having any other symptoms (ex. Dizziness, headache, blurred vision, passed out)? Headache   3. What is your BP issue? She states she wanted to let Dr. Harl Bowie know.

## 2021-12-31 ENCOUNTER — Ambulatory Visit (INDEPENDENT_AMBULATORY_CARE_PROVIDER_SITE_OTHER): Payer: Medicare Other | Admitting: Family Medicine

## 2021-12-31 ENCOUNTER — Encounter: Payer: Self-pay | Admitting: Family Medicine

## 2021-12-31 VITALS — BP 112/74 | Wt 200.0 lb

## 2021-12-31 DIAGNOSIS — F439 Reaction to severe stress, unspecified: Secondary | ICD-10-CM

## 2021-12-31 DIAGNOSIS — E162 Hypoglycemia, unspecified: Secondary | ICD-10-CM | POA: Diagnosis not present

## 2021-12-31 NOTE — Progress Notes (Signed)
   Subjective:    Patient ID: Lance [name], male    DOB: [birth year], 76 y.o.   MRN: [id number]  HPI Pt arrives for follow up. Pt states blood pressure became high on Tuesday (198/97).   Pt states abdominal pain is the same. CT scans are scheduled for 01/12/22. Pt reports no issues with meds. Pt blood sugar dropped to 43 this morning.  Very nice patient Having a lot of stress related issues States blood pressure goes up when he is stressed  Review of Systems     Objective:   Physical Exam  General-in no acute distress Eyes-no discharge Lungs-respiratory rate normal, CTA CV-no murmurs,RRR Extremities skin warm dry no edema Neuro grossly normal Behavior normal, alert       Assessment & Plan:  Stress related issues-being stressed by multiple different things trying to tolerate it as best as he can not suicidal  Blood pressure elevated earlier earlier in the week when he was stressed it is doing better now  Low glucose follows with endocrinologist this only happens occasionally he tries to be consistent with eating patterns  He regular follow-up visit

## 2022-01-05 MED ORDER — LOSARTAN POTASSIUM 25 MG PO TABS: 37.5000 mg | ORAL_TABLET | Freq: Every day | ORAL | 3 refills | Status: DC

## 2022-01-05 NOTE — Telephone Encounter (Signed)
BP's too high. Verify taking losartan '25mg'$  daily, if so increase to 37.'5mg'$  daily  J Ardie Mclennan MD

## 2022-01-05 NOTE — Telephone Encounter (Signed)
Patient wife notified and verbalized understanding. New rx sent to pharmacy.

## 2022-01-12 ENCOUNTER — Ambulatory Visit (HOSPITAL_COMMUNITY)
Admission: RE | Admit: 2022-01-12 | Discharge: 2022-01-12 | Disposition: A | Discharge status: Home / Self Care | Payer: Medicare Other | Source: Ambulatory Visit | Attending: Family Medicine | Admitting: Family Medicine

## 2022-01-12 DIAGNOSIS — R1011 Right upper quadrant pain: Secondary | ICD-10-CM | POA: Insufficient documentation

## 2022-01-12 DIAGNOSIS — R634 Abnormal weight loss: Secondary | ICD-10-CM | POA: Insufficient documentation

## 2022-01-12 DIAGNOSIS — N200 Calculus of kidney: Secondary | ICD-10-CM | POA: Diagnosis not present

## 2022-01-12 DIAGNOSIS — R1031 Right lower quadrant pain: Secondary | ICD-10-CM | POA: Diagnosis not present

## 2022-01-12 DIAGNOSIS — K8020 Calculus of gallbladder without cholecystitis without obstruction: Secondary | ICD-10-CM | POA: Diagnosis not present

## 2022-01-12 MED ORDER — IOHEXOL 300 MG/ML  SOLN
100.0000 mL | Freq: Once | INTRAMUSCULAR | Status: AC | PRN
  Administered 2022-01-12: 100 mL via INTRAVENOUS

## 2022-01-12 NOTE — Progress Notes (Signed)
Remote ICD transmission.   

## 2022-01-18 DIAGNOSIS — M47892 Other spondylosis, cervical region: Secondary | ICD-10-CM | POA: Diagnosis not present

## 2022-01-18 DIAGNOSIS — M7918 Myalgia, other site: Secondary | ICD-10-CM | POA: Diagnosis not present

## 2022-01-18 DIAGNOSIS — Z79899 Other long term (current) drug therapy: Secondary | ICD-10-CM | POA: Diagnosis not present

## 2022-01-18 DIAGNOSIS — Z79891 Long term (current) use of opiate analgesic: Secondary | ICD-10-CM | POA: Diagnosis not present

## 2022-01-18 DIAGNOSIS — G894 Chronic pain syndrome: Secondary | ICD-10-CM | POA: Diagnosis not present

## 2022-01-20 ENCOUNTER — Telehealth: Payer: Self-pay | Admitting: *Deleted

## 2022-01-20 NOTE — Telephone Encounter (Signed)
Pt had recent CT done. Please advise Cyril Mourning thanks!

## 2022-01-20 NOTE — Telephone Encounter (Signed)
Agree with Kristen's assessment. CT in 2018, radiology recommended u/s in five years because of ectatic infrarenal abdominal aorta. Current CT without documented aneurysm or ectatic aorta. No need for additional study at this time.

## 2022-01-20 NOTE — Telephone Encounter (Signed)
5 YR REPEAT AORTA ULTRASOUND

## 2022-01-20 NOTE — Telephone Encounter (Signed)
NotedLelon Frohlich please take off recall. thanks

## 2022-01-20 NOTE — Telephone Encounter (Signed)
Recall has been deleted

## 2022-01-20 NOTE — Telephone Encounter (Signed)
This is something Magda Paganini had recommended back in 2018 when he was found to have 2.5 cm ectatic infrarenal abdominal aorta. Reviewed recent CT chest, abdomen, and pelvis. No mention of aortic aneurysm. At this point, I would not recommend additional imaging, but will route to [place] to ensure she has no other recommendations.  Ideally, this should be something that is followed by his PCP and/or cardiologist.

## 2022-01-21 ENCOUNTER — Other Ambulatory Visit: Payer: Self-pay | Admitting: Gastroenterology

## 2022-01-21 DIAGNOSIS — K219 Gastro-esophageal reflux disease without esophagitis: Secondary | ICD-10-CM

## 2022-01-21 DIAGNOSIS — R1319 Other dysphagia: Secondary | ICD-10-CM

## 2022-01-22 NOTE — Addendum Note (Signed)
Addended by: Dairl Ponder on: 01/22/2022 11:25 AM   Modules accepted: Orders

## 2022-01-26 ENCOUNTER — Encounter: Payer: Self-pay | Admitting: *Deleted

## 2022-01-27 ENCOUNTER — Ambulatory Visit: Payer: Medicare Other | Admitting: Urology
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
## 2022-02-01 ENCOUNTER — Ambulatory Visit (HOSPITAL_COMMUNITY)
Admission: RE | Admit: 2022-02-01 | Discharge: 2022-02-01 | Disposition: A | Discharge status: Home / Self Care | Payer: Medicare Other | Source: Ambulatory Visit | Attending: Nurse Practitioner | Admitting: Nurse Practitioner

## 2022-02-01 ENCOUNTER — Encounter: Payer: Self-pay | Admitting: Nurse Practitioner

## 2022-02-01 ENCOUNTER — Ambulatory Visit (INDEPENDENT_AMBULATORY_CARE_PROVIDER_SITE_OTHER): Payer: Medicare Other | Admitting: Nurse Practitioner

## 2022-02-01 VITALS — BP 153/72 | HR 74 | Temp 98.2°F | Ht 69.0 in | Wt 198.0 lb

## 2022-02-01 DIAGNOSIS — R079 Chest pain, unspecified: Secondary | ICD-10-CM | POA: Diagnosis not present

## 2022-02-01 DIAGNOSIS — R059 Cough, unspecified: Secondary | ICD-10-CM | POA: Diagnosis not present

## 2022-02-01 DIAGNOSIS — E1159 Type 2 diabetes mellitus with other circulatory complications: Secondary | ICD-10-CM | POA: Diagnosis not present

## 2022-02-01 DIAGNOSIS — R051 Acute cough: Secondary | ICD-10-CM | POA: Insufficient documentation

## 2022-02-01 DIAGNOSIS — R0602 Shortness of breath: Secondary | ICD-10-CM | POA: Diagnosis not present

## 2022-02-01 MED ORDER — CEFDINIR 300 MG PO CAPS: 300.0000 mg | ORAL_CAPSULE | Freq: Two times a day (BID) | ORAL | 0 refills | Status: AC

## 2022-02-01 NOTE — Progress Notes (Signed)
Subjective:    Patient ID: Lance [name], male    DOB: 1945/10/29, 76 y.o.   MRN: [id number]  HPI  76 year old male patient with history of COPD presents to the clinic today with complaints of chills, productive cough with yellow phlegm, and congestion, wheezing, shortness of breath x6 days.  Patient denies any difficulty breathing or chest tightness, fevers, chest pain.  Patient also states that he has been feeling like he has low energy for the past couple months.  Patient sees endocrinology outside of [facility] system and patient reports an A1c of 10 on his last A1c.  Patient states he has a repeat appointment on the 18th.  Patient states that his blood sugars can run anywhere between 64 and 250.  Review of Systems  Constitutional:  Positive for chills.  Respiratory:  Positive for cough, shortness of breath and wheezing.        Objective:   Physical Exam Vitals reviewed.  Constitutional:      General: He is not in acute distress.    Appearance: Normal appearance. He is normal weight. He is not ill-appearing, toxic-appearing or diaphoretic.  HENT:     Head: Normocephalic and atraumatic.     Nose: Nose normal. No congestion or rhinorrhea.     Mouth/Throat:     Mouth: Mucous membranes are moist.     Pharynx: Oropharynx is clear. No oropharyngeal exudate or posterior oropharyngeal erythema.  Cardiovascular:     Rate and Rhythm: Normal rate and regular rhythm.     Pulses: Normal pulses.     Heart sounds: Normal heart sounds. No murmur heard. Pulmonary:     Effort: Pulmonary effort is normal. No respiratory distress.     Breath sounds: Normal breath sounds. No wheezing.     Comments: No wheezing heard on today's exam Musculoskeletal:     Cervical back: Normal range of motion and neck supple. No rigidity or tenderness.     Comments: Grossly intact  Lymphadenopathy:     Cervical: No cervical adenopathy.  Skin:    General: Skin is warm.     Capillary Refill: Capillary refill  takes less than 2 seconds.  Neurological:     Mental Status: He is alert.     Comments: Grossly intact  Psychiatric:        Mood and Affect: Mood normal.        Behavior: Behavior normal.           Assessment & Plan:   1. Acute cough -Pneumonia versus COPD exacerbation -We will get x-ray to rule out pneumonia or infiltrations - DG Chest 2 View -If chest x-ray within normal limits we will treat patient for COPD exacerbation with Omnicef -We will withhold prednisone at this time due to likely uncontrolled diabetes. - cefdinir (OMNICEF) 300 MG capsule; Take 1 capsule (300 mg total) by mouth 2 (two) times daily for 7 days.  Dispense: 14 capsule; Refill: 0 -Return to clinic within 3-7 days for follow-up.  Return to clinic sooner if needed for worsening symptoms  2. Type 2 diabetes mellitus with vascular disease ([place]) -Patient appears to have uncontrolled diabetes currently being followed by provider outside of [facility]. -Patient self-reports A1c of 10 -Follow-up as scheduled with endocrinologist -Follow-up with PCP as scheduled    Note:  This document was prepared using Dragon voice recognition software and may include unintentional dictation errors. Note - This record has been created using [name].  Chart creation errors have been sought, but may  not always  have been located. Such creation errors do not reflect on  the standard of medical care.

## 2022-02-04 LAB — HM DIABETES EYE EXAM

## 2022-02-11 ENCOUNTER — Ambulatory Visit (INDEPENDENT_AMBULATORY_CARE_PROVIDER_SITE_OTHER): Payer: Medicare Other | Admitting: Nurse Practitioner

## 2022-02-11 ENCOUNTER — Encounter: Payer: Self-pay | Admitting: Nurse Practitioner

## 2022-02-11 VITALS — BP 112/60 | HR 71 | Temp 98.4°F | Wt 202.0 lb

## 2022-02-11 DIAGNOSIS — J441 Chronic obstructive pulmonary disease with (acute) exacerbation: Secondary | ICD-10-CM | POA: Diagnosis not present

## 2022-02-11 DIAGNOSIS — Z23 Encounter for immunization: Secondary | ICD-10-CM | POA: Diagnosis not present

## 2022-02-11 MED ORDER — STIOLTO RESPIMAT 2.5-2.5 MCG/ACT IN AERS: 2.0000 | INHALATION_SPRAY | Freq: Every day | RESPIRATORY_TRACT | 1 refills | Status: DC

## 2022-02-11 MED ORDER — ALBUTEROL SULFATE HFA 108 (90 BASE) MCG/ACT IN AERS: 2.0000 | INHALATION_SPRAY | Freq: Four times a day (QID) | RESPIRATORY_TRACT | 2 refills | Status: DC | PRN

## 2022-02-11 NOTE — Progress Notes (Signed)
   Subjective:    Patient ID: Lance [name], male    DOB: 07-20-1945, 76 y.o.   MRN: [id number]  HPI  76 year old male presents to the clinic with is wife today to follow up on cough. Patient was treated with abx during last visit. Patient stated that wheezing is a lot better and SOB is better. However, patient still admits to a cough. Patient describes cough as productive cough with white sputum. Patient states that the right side of rib cage is little achy especially when he coughs.   Patient continues to complain of low energy.  Patient is not using his inhaler and does not have any inhalers prescribed at this time because he states inhalers cause him to crave alcohol. 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  Patient denies any chest pain, changes to his SOB or SOB with exertion, swelling to legs, new orthopnea.   Review of Systems  Respiratory:  Positive for cough.        Objective:   Physical Exam Vitals reviewed.  Constitutional:      General: He is not in acute distress.    Appearance: Normal appearance. He is normal weight. He is not ill-appearing, toxic-appearing or diaphoretic.  HENT:     Head: Normocephalic and atraumatic.  Cardiovascular:     Rate and Rhythm: Normal rate and regular rhythm.     Pulses: Normal pulses.     Heart sounds: Normal heart sounds. No murmur heard. Pulmonary:     Effort: Pulmonary effort is normal. No respiratory distress.     Breath sounds: Rhonchi present. No wheezing.     Comments: Rhonchi heard to Bilateral upper lung lobes.  Musculoskeletal:     Cervical back: Normal range of motion and neck supple. No rigidity or tenderness.     Comments: Grossly intact  Lymphadenopathy:     Cervical: No cervical adenopathy.  Skin:    General: Skin is warm.     Capillary Refill: Capillary refill takes less than 2 seconds.  Neurological:     Mental Status: He is alert.     Comments: Grossly intact  Psychiatric:        Mood and Affect:  Mood normal.        Behavior: Behavior normal.        Assessment & Plan:   1. Chronic obstructive pulmonary disease with acute exacerbation (HCC) - Suspected pulmonary etiology for continued cough and baseline SOB - Believe patient would greatly benefit from maintenance inhaler and rescue inhaler to improve pulmonary function - albuterol (VENTOLIN HFA) 108 (90 Base) MCG/ACT inhaler; Inhale 2 puffs into the lungs every 6 (six) hours as needed for wheezing or shortness of breath.  Dispense: 8 g; Refill: 2 - Tiotropium Bromide-Olodaterol (STIOLTO RESPIMAT) 2.5-2.5 MCG/ACT AERS; Inhale 2 puffs into the lungs daily.  Dispense: 4 g; Refill: 1 - Believe patient would benefit from updated PFT -Pulmonary referral placed.   Last ECHO done 08/2020 with 50 to 55% EF. Being followed closely by Cardiology to monitor ICD placement S/P VF, CHF, CAD. Last ICD check was 12/24/2021.   RTC in 2 weeks for follow up.  2. Need for vaccination - Flu Vaccine QUAD High Dose(Fluad)

## 2022-02-15 DIAGNOSIS — S0502XD Injury of conjunctiva and corneal abrasion without foreign body, left eye, subsequent encounter: Secondary | ICD-10-CM | POA: Diagnosis not present

## 2022-02-17 ENCOUNTER — Encounter: Payer: Self-pay | Admitting: *Deleted

## 2022-02-19 ENCOUNTER — Encounter: Payer: Self-pay | Admitting: Gastroenterology

## 2022-02-19 ENCOUNTER — Ambulatory Visit (INDEPENDENT_AMBULATORY_CARE_PROVIDER_SITE_OTHER): Payer: Medicare Other | Admitting: Gastroenterology

## 2022-02-19 VITALS — BP 146/69 | HR 77 | Temp 97.8°F | Ht 69.0 in | Wt 207.6 lb

## 2022-02-19 DIAGNOSIS — K219 Gastro-esophageal reflux disease without esophagitis: Secondary | ICD-10-CM

## 2022-02-19 DIAGNOSIS — R1011 Right upper quadrant pain: Secondary | ICD-10-CM

## 2022-02-19 NOTE — Patient Instructions (Addendum)
Your right upper abdominal pain, nausea, poor appetite could be secondary to gallstones, gastritis, or ulcers.   We will increase your pantoprazole to '40mg'$  before breakfast and before evening meal. I CHECKED YOUR CURRENT PRESCRIPTION AND YOU ARE ALREADY RECEIVING ENOUGH TO TAKE TWICE A DAY SO THERE IS NO NEED FOR ME TO CHANGE YOUR PRESCRIPTION.  Continue low-fat diet.   If you have worsening abdominal pain, nausea please call the office. Otherwise we will see you back in four weeks. If no significant improvement, you may need an upper endoscopy.

## 2022-02-19 NOTE — Progress Notes (Signed)
GI Office Note    Referring Provider: [name], MD Primary Care Physician:  [name], MD  Primary Gastroenterologist: Garfield Cornea, MD  Chief Complaint   Chief Complaint  Patient presents with   Abdominal Pain    RUQ pain, states that he gets full quick when eating and also is having a lot of gas.     History of Present Illness   Lance [name] is a 76 y.o. male presenting today for follow up of abdominal pain. He was last seen in January.  He has a history of GERD, constipation, adenomatous colon polyps.  EGD and colonoscopy on file from January 2018.  Colonoscopy single 6 mm inflammatory-type colonic polyp, currently due for surveillance.  EGD with normal esophagus status post dilation, small hiatal hernia, gastric polyps.  Patient was seen by PCP back in 12/2021 for RUQ pain associated with poor appetite, early satiety, weight loss over the past six months.   CT A/P completed that showed cholelithiasis and nonobstructing renal stone.    Labs from August 2023: Platelets 147,000, hemoglobin 14.3, glucose 198, creatinine 1.03, lipase 35, total bilirubin 0.2, alkaline phosphatase 81, AST 20, ALT 17, albumin 4.3.  He describes pain in the RUQ region worse with meals and sometimes associated with nausea. No vomiting. He also has pain in the right mid chest, throbbing type pain, unrelated to meals. He was placed on inhalers few weeks ago and states his pain has improved. He has had some belching/flatulence that is increased. When he has the RUQ pain he often feels like he needs to have a BM but doesn't. BM regular. Stools are soft/soupy since on iron few months. He started iron because of fatigue and his wife thought it might help. He denies heartburn, vomiting. BM 1-2 times per day.   He notes that his appetite has improved lately, had been poor several months ago. Weight is up about 7 pounds since 12/2021. Still down 15 pounds or so in the past one year.   Patient is  due for surveillance colonoscopy. He is not interested in pursuing colonoscopy, "at his age".   CT abdomen pelvis with contrast September 2023 IMPRESSION: 1. No acute intrathoracic, abdominal, or pelvic pathology. 2. No bowel obstruction or active inflammation.  Normal appendix. 3. Cholelithiasis. 4. A 3 mm nonobstructing left renal upper pole calculus. No hydronephrosis. 5. Aortic Atherosclerosis (ICD10-I70.0).     Medications   Current Outpatient Medications  Medication Sig Dispense Refill   albuterol (VENTOLIN HFA) 108 (90 Base) MCG/ACT inhaler Inhale 2 puffs into the lungs every 6 (six) hours as needed for wheezing or shortness of breath. 8 g 2   alfuzosin (UROXATRAL) 10 MG 24 hr tablet Take 1 tablet (10 mg total) by mouth daily. 30 tablet 11   ALPRAZolam (XANAX) 0.5 MG tablet Take 1 tablet (0.5 mg total) by mouth 2 (two) times daily. 60 tablet 2   aspirin EC 81 MG tablet Take 81 mg by mouth daily.     Cholecalciferol 125 MCG (5000 UT) TABS Take 1 tablet by mouth daily.     clopidogrel (PLAVIX) 75 MG tablet TAKE ONE (1) TABLET BY MOUTH EVERY DAY 90 tablet 0   Coenzyme Q10-Vitamin E (QUNOL ULTRA COQ10 PO) Take 1 tablet by mouth daily.     donepezil (ARICEPT) 10 MG tablet TAKE ONE TABLET BY MOUTH EVERY NIGHT AT BEDTIME 90 tablet 3   ezetimibe (ZETIA) 10 MG tablet Take 1 tablet (10 mg total)  by mouth daily. 90 tablet 3   FARXIGA 10 MG TABS tablet Take 10 mg by mouth daily.     fexofenadine (ALLEGRA) 180 MG tablet Take 180 mg by mouth daily.     HUMALOG KWIKPEN 100 UNIT/ML KiwkPen Inject 0.32-0.38 mLs (32-38 Units total) into the skin 3 (three) times daily. (Patient taking differently: Inject 25 Units into the skin 3 (three) times daily before meals.) 30 pen 2   HYDROcodone-acetaminophen (NORCO) 7.5-325 MG tablet Take 0.5-1 tablets by mouth every 12 (twelve) hours as needed for moderate pain.     Insulin Detemir (LEVEMIR FLEXTOUCH) 100 UNIT/ML Pen Inject 90 Units into the skin daily at  10 pm. (Patient taking differently: Inject 100 Units into the skin at bedtime.) 30 pen 2   Insulin Pen Needle (B-D ULTRAFINE III SHORT PEN) 31G X 8 MM MISC USE 1 NEEDLE 4 TIMES DAILY AS DIRECTED     losartan (COZAAR) 25 MG tablet Take 1.5 tablets (37.5 mg total) by mouth daily. 135 tablet 3   metoprolol tartrate (LOPRESSOR) 25 MG tablet Take 0.5 tablets (12.5 mg total) by mouth 2 (two) times daily. 90 tablet 3   mirabegron ER (MYRBETRIQ) 25 MG TB24 tablet Take 1 tablet (25 mg total) by mouth daily. 30 tablet 11   montelukast (SINGULAIR) 10 MG tablet TAKE 1 TABLET BY MOUTH AT  BEDTIME 100 tablet 2   nitroGLYCERIN (NITROSTAT) 0.4 MG SL tablet PLACE ONE TABLET (0.'4MG'$  TOTAL) UNDER THETONGUE EVERY FIVE MINUTES AS NEEDED FOR CHEST PAIN 25 tablet 3   ONETOUCH ULTRA test strip USE 1 STRIP TO CHECK GLUCOSE 4 TIMES DAILY     pantoprazole (PROTONIX) 40 MG tablet TAKE ONE TABLET BY MOUTH ONCE DAILY 30 MINUTES BEFORE BREAKFAST. MAY TAKE A SECOND DOSE 30 MINUTES BEFORE DINNER IF NEEDED. 180 tablet 1   Probiotic Product (ALIGN PO) Take 1 tablet by mouth daily.      rosuvastatin (CRESTOR) 40 MG tablet TAKE ONE (1) TABLET BY MOUTH EVERY DAY 90 tablet 2   sertraline (ZOLOFT) 50 MG tablet Take 1 tablet (50 mg total) by mouth daily. 30 tablet 3   tadalafil (CIALIS) 20 MG tablet Take 1 tablet (20 mg total) by mouth daily as needed for erectile dysfunction. 6 tablet 6   Tiotropium Bromide-Olodaterol (STIOLTO RESPIMAT) 2.5-2.5 MCG/ACT AERS Inhale 2 puffs into the lungs daily. 4 g 1   vitamin B-12 (CYANOCOBALAMIN) 1000 MCG tablet Take 6,000 mcg by mouth daily.     No current facility-administered medications for this visit.    Allergies   Allergies as of 02/19/2022 - Review Complete 02/19/2022  Allergen Reaction Noted   Morphine and related Other (See Comments) 02/08/2012   Penicillins Hives    Percocet [oxycodone-acetaminophen] Other (See Comments) 02/08/2012   Latex Rash 05/13/2014   Levaquin [levofloxacin in  d5w] Itching 04/12/2013   Metformin and related Other (See Comments) 04/11/2015   Tape Rash 05/08/2015        Review of Systems   General: Negative for anorexia,  ever, chills,  weakness. See hpi ENT: Negative for hoarseness, difficulty swallowing , nasal congestion. CV: Negative for chest pain, angina, palpitations, dyspnea on exertion, peripheral edema.  Respiratory: Negative for dyspnea at rest, dyspnea on exertion, cough, sputum, wheezing.  GI: See history of present illness. GU:  Negative for dysuria, hematuria, urinary incontinence, urinary frequency, nocturnal urination.  Endo: Negative for unusual weight change.     Physical Exam   BP (!) 146/69 (BP Location: Right Arm, Patient Position:  Sitting, Cuff Size: Normal)   Pulse 77   Temp 97.8 F (36.6 C) (Oral)   Ht '5\' 9"'$  (1.753 m)   Wt 207 lb 9.6 oz (94.2 kg)   SpO2 97%   BMI 30.66 kg/m    General: Well-nourished, well-developed in no acute distress.  Eyes: No icterus. Mouth: Oropharyngeal mucosa moist and pink , no lesions erythema or exudate. Lungs: Clear to auscultation bilaterally.  Heart: Regular rate and rhythm, no murmurs rubs or gallops.  Abdomen: Bowel sounds are normal,  nondistended, no hepatosplenomegaly or masses,  no abdominal bruits or hernia , no rebound or guarding. Mild ruq tenderness with deep palpation. Rectal: not performed Extremities: No lower extremity edema. No clubbing or deformities. Neuro: Alert and oriented x 4   Skin: Warm and dry, no jaundice.   Psych: Alert and cooperative, normal mood and affect.  Labs   Lab Results  Component Value Date   ALT 17 12/03/2021   AST 20 12/03/2021   ALKPHOS 81 12/03/2021   BILITOT 0.2 12/03/2021   Lab Results  Component Value Date   CREATININE 1.03 12/03/2021   BUN 10 12/03/2021   NA 143 12/03/2021   K 4.2 12/03/2021   CL 107 (H) 12/03/2021   CO2 22 12/03/2021   Lab Results  Component Value Date   WBC 5.9 12/03/2021   HGB 14.3 12/03/2021    HCT 43.3 12/03/2021   MCV 85 12/03/2021   PLT 147 (L) 12/03/2021   Lab Results  Component Value Date   LIPASE 35 12/03/2021    Imaging Studies   DG Chest 2 View  Result Date: 02/01/2022 CLINICAL DATA:  Shortness of breath, cough and right-sided chest pain. EXAM: CHEST - 2 VIEW COMPARISON:  07/21/2020 FINDINGS: Previous median sternotomy. Pacemaker/defibrillator remains in place. The lungs are clear. The vascularity is normal. No effusions. No acute bone finding. IMPRESSION: No active disease. Previous median sternotomy. Pacemaker/defibrillator. Electronically Signed   By: Nelson Chimes M.D.   On: 02/01/2022 12:15    Assessment   RUQ pain: associated with meals. Also with right mid chest pain, ?improved with inhalers. Early satiety and appetite is better. He has cholelithiasis on CT, cannot exclude intermittent biliary source for his pain. Gastritis/duodenitis/PUD remains possibility. Notably we reduced his PPI from BID after last visit to once daily. Heartburn is controlled but query if may be playing a role. We discussed change in PPI dosing vs EGD. At this time he want to try medication (less invasive approach).    PLAN   Low fat diet.  Pantoprazole '40mg'$  BID before meals. Return to the office in four weeks to reassess but call sooner if any worsening abdominal pain, nausea.  Patient not interested in future colonoscopies.   Laureen Ochs. Bobby Rumpf, [place], [facility]

## 2022-02-23 ENCOUNTER — Other Ambulatory Visit: Payer: Self-pay | Admitting: Family Medicine

## 2022-02-24 ENCOUNTER — Ambulatory Visit (INDEPENDENT_AMBULATORY_CARE_PROVIDER_SITE_OTHER): Payer: Medicare Other | Admitting: Nurse Practitioner

## 2022-02-24 VITALS — BP 112/64 | HR 72 | Ht 69.0 in | Wt 206.6 lb

## 2022-02-24 DIAGNOSIS — J441 Chronic obstructive pulmonary disease with (acute) exacerbation: Secondary | ICD-10-CM

## 2022-02-24 DIAGNOSIS — R0602 Shortness of breath: Secondary | ICD-10-CM

## 2022-02-24 MED ORDER — AZITHROMYCIN 250 MG PO TABS: ORAL_TABLET | ORAL | 0 refills | Status: AC

## 2022-02-24 NOTE — Progress Notes (Signed)
Pt was seen in office this morning

## 2022-02-24 NOTE — Progress Notes (Signed)
   Subjective:    Patient ID: Lance [name], male    DOB: 1945-09-22, 76 y.o.   MRN: [id number]  HPI  Patient arrives for a follow up on COPD.  Patient states that he is using both inhalers without difficulty.  Patient states that it feels a little easier to cough stuff up.  However patient states that his cough still persist and he is coughing up yellow sputum.  Patient denies any fevers, chest pain, orthopnea.  But does admit to chills, body aches, shortness of breath.      Review of Systems  Constitutional:  Positive for chills.  Respiratory:  Positive for cough and shortness of breath.        Objective:   Physical Exam Vitals reviewed.  Constitutional:      General: He is not in acute distress.    Appearance: Normal appearance. He is normal weight. He is not ill-appearing, toxic-appearing or diaphoretic.  HENT:     Head: Normocephalic and atraumatic.  Cardiovascular:     Rate and Rhythm: Normal rate and regular rhythm.     Pulses: Normal pulses.     Heart sounds: Normal heart sounds. No murmur heard. Pulmonary:     Effort: Pulmonary effort is normal. No respiratory distress.     Breath sounds: Rhonchi present.     Comments: Coarse rhonchi heard throughout left and right lobes Musculoskeletal:     Cervical back: Normal range of motion and neck supple. No rigidity or tenderness.     Comments: Grossly intact  Lymphadenopathy:     Cervical: No cervical adenopathy.  Skin:    General: Skin is warm.     Capillary Refill: Capillary refill takes less than 2 seconds.  Neurological:     Mental Status: He is alert.     Comments: Grossly intact  Psychiatric:        Mood and Affect: Mood normal.        Behavior: Behavior normal.           Assessment & Plan:   1. Chronic obstructive pulmonary disease with acute exacerbation (HCC) -Likely COPD exacerbation versus COPD disease progression - azithromycin (ZITHROMAX) 250 MG tablet; Take 2 tablets on day 1, then 1 tablet  daily on days 2 through 5  Dispense: 6 tablet; Refill: 0 -Referral to pulmonology already placed. -Return to clinic in 4 to 6 weeks for follow-up  2. SOB (shortness of breath) -Shortness of breath likely pulmonary related - EKG 12-Lead - azithromycin (ZITHROMAX) 250 MG tablet; Take 2 tablets on day 1, then 1 tablet daily on days 2 through 5  Dispense: 6 tablet; Refill: 0 -Follow-up with pulmonology per referral -Return to clinic for follow-up in 4 to 6 weeks    Note:  This document was prepared using Dragon voice recognition software and may include unintentional dictation errors. Note - This record has been created using [name].  Chart creation errors have been sought, but may not always  have been located. Such creation errors do not reflect on  the standard of medical care.

## 2022-02-25 ENCOUNTER — Ambulatory Visit: Payer: Medicare Other | Admitting: Nurse Practitioner

## 2022-02-25 DIAGNOSIS — Z794 Long term (current) use of insulin: Secondary | ICD-10-CM | POA: Diagnosis not present

## 2022-02-25 DIAGNOSIS — I2510 Atherosclerotic heart disease of native coronary artery without angina pectoris: Secondary | ICD-10-CM | POA: Diagnosis not present

## 2022-02-25 DIAGNOSIS — E1165 Type 2 diabetes mellitus with hyperglycemia: Secondary | ICD-10-CM | POA: Diagnosis not present

## 2022-02-25 DIAGNOSIS — E1142 Type 2 diabetes mellitus with diabetic polyneuropathy: Secondary | ICD-10-CM | POA: Diagnosis not present

## 2022-02-27 ENCOUNTER — Encounter: Payer: Self-pay | Admitting: Nurse Practitioner

## 2022-03-03 DIAGNOSIS — S0502XD Injury of conjunctiva and corneal abrasion without foreign body, left eye, subsequent encounter: Secondary | ICD-10-CM | POA: Diagnosis not present

## 2022-03-08 LAB — HEMOGLOBIN A1C: Hemoglobin A1C: 8.7

## 2022-03-18 ENCOUNTER — Ambulatory Visit (INDEPENDENT_AMBULATORY_CARE_PROVIDER_SITE_OTHER): Payer: Medicare Other

## 2022-03-18 DIAGNOSIS — I255 Ischemic cardiomyopathy: Secondary | ICD-10-CM

## 2022-03-18 LAB — CUP PACEART REMOTE DEVICE CHECK
Battery Remaining Longevity: 92 mo
Battery Remaining Percentage: 94 %
Battery Voltage: 3.19 V
Brady Statistic AP VP Percent: 1 %
Brady Statistic AP VS Percent: 1 %
Brady Statistic AS VP Percent: 1 %
Brady Statistic AS VS Percent: 99 %
Brady Statistic RA Percent Paced: 1 %
Brady Statistic RV Percent Paced: 1 %
Date Time Interrogation Session: 20231116024259
HighPow Impedance: 54 Ohm
HighPow Impedance: 54 Ohm
Implantable Lead Connection Status: 753985
Implantable Lead Connection Status: 753985
Implantable Lead Implant Date: 20050706
Implantable Lead Implant Date: 20161128
Implantable Lead Location: 753859
Implantable Lead Location: 753860
Implantable Lead Model: 7122
Implantable Pulse Generator Implant Date: 20230518
Lead Channel Impedance Value: 350 Ohm
Lead Channel Impedance Value: 380 Ohm
Lead Channel Pacing Threshold Amplitude: 0.75 V
Lead Channel Pacing Threshold Amplitude: 1 V
Lead Channel Pacing Threshold Pulse Width: 0.5 ms
Lead Channel Pacing Threshold Pulse Width: 0.5 ms
Lead Channel Sensing Intrinsic Amplitude: 1.7 mV
Lead Channel Sensing Intrinsic Amplitude: 7.8 mV
Lead Channel Setting Pacing Amplitude: 2 V
Lead Channel Setting Pacing Amplitude: 2.5 V
Lead Channel Setting Pacing Pulse Width: 0.5 ms
Lead Channel Setting Sensing Sensitivity: 0.5 mV
Pulse Gen Serial Number: 8942717

## 2022-03-19 DIAGNOSIS — G894 Chronic pain syndrome: Secondary | ICD-10-CM | POA: Diagnosis not present

## 2022-03-19 DIAGNOSIS — M7918 Myalgia, other site: Secondary | ICD-10-CM | POA: Diagnosis not present

## 2022-03-19 DIAGNOSIS — M47892 Other spondylosis, cervical region: Secondary | ICD-10-CM | POA: Diagnosis not present

## 2022-03-24 ENCOUNTER — Ambulatory Visit: Payer: Medicare Other | Admitting: Gastroenterology

## 2022-03-24 ENCOUNTER — Encounter: Payer: Self-pay | Admitting: Gastroenterology

## 2022-03-24 VITALS — BP 126/75 | HR 76 | Temp 97.7°F | Ht 69.0 in | Wt 204.0 lb

## 2022-03-24 DIAGNOSIS — K219 Gastro-esophageal reflux disease without esophagitis: Secondary | ICD-10-CM | POA: Diagnosis not present

## 2022-03-24 DIAGNOSIS — R197 Diarrhea, unspecified: Secondary | ICD-10-CM | POA: Diagnosis not present

## 2022-03-24 NOTE — Patient Instructions (Signed)
Glad you are feeling better! Continue to watch for recurrent diarrhea, excessive gas. If you have any recurrent symptoms, please call and let us know. You can call CMA Tammy at [phone].  Continue pantoprazole. You can take once or twice daily before a meal.  Return to the office in six months for follow up.

## 2022-03-24 NOTE — Progress Notes (Signed)
GI Office Note    Referring Provider: [name], MD Primary Care Physician:  [name], MD  Primary Gastroenterologist: Garfield Cornea, MD  Chief Complaint   Chief Complaint  Patient presents with   Follow-up    No longer having issues with diarrhea.     History of Present Illness   Lance [name] is a 76 y.o. male presenting today for follow-up.  He was last seen February 19, 2022.  He has a history of GERD, constipation, adenomatous colon polyps.  EGD and colonoscopy January 2018.  Colonoscopy single 6 mm inflammatory-type colonic polyp, currently due for surveillance.  EGD with normal esophagus status post dilation, small hiatal hernia, gastric polyps.  Patient seen in August 2023 for right upper quadrant pain associated with poor appetite, early satiety, weight loss over 6 months.CT A/P completed that showed cholelithiasis and nonobstructing renal stone.  Labs from August 2023: Platelets 147,000, hemoglobin 14.3, glucose 198, creatinine 1.03, lipase 35, total bilirubin 0.2, alkaline phosphatase 81, AST 20, ALT 17, albumin 4.3.  When I saw him back in October he complained of right upper quadrant pain worse with meals, sometimes associated with nausea.  He also had pain in the right mid chest, throbbing type pain, unrelated to meals.  He was placed on inhalers which improved his symptoms.  States when he had right upper quadrant pain he often has the urge to have a bowel movement.  Typically stools regular, could be soft which she felt might be related to iron that his wife suggested he take.   Weight stable since 08/2021. Stools got worse after last visit. A lot of gas. Was having episodes of incontinence. He would feel ok some days and noticed in the evening his stomach would roll and he would have frequent loose stools. Got bad for a few weeks. He then realized his symptoms occurred after eating sugar free cookies. Wife called company who confirmed artificial sweetener  could cause some to have diarrhea. Wife tried cookies and had profuse diarrhea as well. Patient stopped eating the cookies about 2 weeks ago. Diarrhea stopped. His stools are almost back to normal. Having BM once every day to every other day. Gas resolved. Heartburn doing ok. Three episodes related to bbq and hotdogs since last visit. Rare dysphagia possibly due to eating too fast.   Patient not interested in pursuing colonoscopy for surveillance purposes given his age.   CT abdomen pelvis with contrast September 2023 IMPRESSION: 1. No acute intrathoracic, abdominal, or pelvic pathology. 2. No bowel obstruction or active inflammation.  Normal appendix. 3. Cholelithiasis. 4. A 3 mm nonobstructing left renal upper pole calculus. No hydronephrosis. 5. Aortic Atherosclerosis (ICD10-I70.0).   Medications   Current Outpatient Medications  Medication Sig Dispense Refill   albuterol (VENTOLIN HFA) 108 (90 Base) MCG/ACT inhaler Inhale 2 puffs into the lungs every 6 (six) hours as needed for wheezing or shortness of breath. 8 g 2   alfuzosin (UROXATRAL) 10 MG 24 hr tablet Take 1 tablet (10 mg total) by mouth daily. 30 tablet 11   ALPRAZolam (XANAX) 0.5 MG tablet Take 1 tablet (0.5 mg total) by mouth 2 (two) times daily. 60 tablet 2   aspirin EC 81 MG tablet Take 81 mg by mouth daily.     Cholecalciferol 125 MCG (5000 UT) TABS Take 1 tablet by mouth daily.     clopidogrel (PLAVIX) 75 MG tablet TAKE ONE (1) TABLET BY MOUTH EVERY DAY 90 tablet 0  Coenzyme Q10-Vitamin E (QUNOL ULTRA COQ10 PO) Take 1 tablet by mouth daily.     donepezil (ARICEPT) 10 MG tablet TAKE ONE TABLET BY MOUTH EVERY NIGHT AT BEDTIME 90 tablet 3   ezetimibe (ZETIA) 10 MG tablet Take 1 tablet (10 mg total) by mouth daily. 90 tablet 3   FARXIGA 10 MG TABS tablet Take 10 mg by mouth daily.     fexofenadine (ALLEGRA) 180 MG tablet Take 180 mg by mouth daily.     HUMALOG KWIKPEN 100 UNIT/ML KiwkPen Inject 0.32-0.38 mLs (32-38 Units  total) into the skin 3 (three) times daily. (Patient taking differently: Inject 25 Units into the skin 3 (three) times daily before meals.) 30 pen 2   HYDROcodone-acetaminophen (NORCO) 7.5-325 MG tablet Take 0.5-1 tablets by mouth every 12 (twelve) hours as needed for moderate pain.     Insulin Detemir (LEVEMIR FLEXTOUCH) 100 UNIT/ML Pen Inject 90 Units into the skin daily at 10 pm. (Patient taking differently: Inject 100 Units into the skin at bedtime.) 30 pen 2   Insulin Pen Needle (B-D ULTRAFINE III SHORT PEN) 31G X 8 MM MISC USE 1 NEEDLE 4 TIMES DAILY AS DIRECTED     losartan (COZAAR) 25 MG tablet Take 1.5 tablets (37.5 mg total) by mouth daily. 135 tablet 3   metoprolol tartrate (LOPRESSOR) 25 MG tablet Take 0.5 tablets (12.5 mg total) by mouth 2 (two) times daily. 90 tablet 3   mirabegron ER (MYRBETRIQ) 25 MG TB24 tablet Take 1 tablet (25 mg total) by mouth daily. 30 tablet 11   montelukast (SINGULAIR) 10 MG tablet TAKE 1 TABLET BY MOUTH AT  BEDTIME 100 tablet 2   nitroGLYCERIN (NITROSTAT) 0.4 MG SL tablet PLACE ONE TABLET (0.'4MG'$  TOTAL) UNDER THETONGUE EVERY FIVE MINUTES AS NEEDED FOR CHEST PAIN 25 tablet 3   ONETOUCH ULTRA test strip USE 1 STRIP TO CHECK GLUCOSE 4 TIMES DAILY     pantoprazole (PROTONIX) 40 MG tablet TAKE ONE TABLET BY MOUTH ONCE DAILY 30 MINUTES BEFORE BREAKFAST. MAY TAKE A SECOND DOSE 30 MINUTES BEFORE DINNER IF NEEDED. 180 tablet 1   Probiotic Product (ALIGN PO) Take 1 tablet by mouth daily.      rosuvastatin (CRESTOR) 40 MG tablet TAKE ONE (1) TABLET BY MOUTH EVERY DAY 90 tablet 2   sertraline (ZOLOFT) 50 MG tablet Take 1 tablet (50 mg total) by mouth daily. 30 tablet 3   tadalafil (CIALIS) 20 MG tablet Take 1 tablet (20 mg total) by mouth daily as needed for erectile dysfunction. 6 tablet 6   Tiotropium Bromide-Olodaterol (STIOLTO RESPIMAT) 2.5-2.5 MCG/ACT AERS Inhale 2 puffs into the lungs daily. 4 g 1   vitamin B-12 (CYANOCOBALAMIN) 1000 MCG tablet Take 6,000 mcg by  mouth daily.     No current facility-administered medications for this visit.    Allergies   Allergies as of 03/24/2022 - Review Complete 02/27/2022  Allergen Reaction Noted   Morphine and related Other (See Comments) 02/08/2012   Penicillins Hives    Percocet [oxycodone-acetaminophen] Other (See Comments) 02/08/2012   Latex Rash 05/13/2014   Levaquin [levofloxacin in d5w] Itching 04/12/2013   Metformin and related Other (See Comments) 04/11/2015   Tape Rash 05/08/2015     Review of Systems   General: Negative for anorexia, weight loss, fever, chills, fatigue, weakness. ENT: Negative for hoarseness, difficulty swallowing , nasal congestion. CV: Negative for chest pain, angina, palpitations, dyspnea on exertion, peripheral edema.  Respiratory: Negative for dyspnea at rest, dyspnea on exertion, cough, sputum, wheezing.  GI: See history of present illness. GU:  Negative for dysuria, hematuria, urinary incontinence, urinary frequency, nocturnal urination.  Endo: Negative for unusual weight change.     Physical Exam   BP 126/75 (BP Location: Right Arm, Patient Position: Sitting, Cuff Size: Large)   Pulse 76   Temp 97.7 F (36.5 C) (Temporal)   Ht '5\' 9"'$  (1.753 m)   Wt 204 lb (92.5 kg)   SpO2 98%   BMI 30.13 kg/m    General: Well-nourished, well-developed in no acute distress.  Eyes: No icterus. Mouth: Oropharyngeal mucosa moist and pink , no lesions erythema or exudate. Abdomen: Bowel sounds are normal, nontender, nondistended, no hepatosplenomegaly or masses,  no abdominal bruits or hernia , no rebound or guarding.  Rectal: not performed Extremities: No lower extremity edema. No clubbing or deformities. Neuro: Alert and oriented x 4   Skin: Warm and dry, no jaundice.   Psych: Alert and cooperative, normal mood and affect.  Labs    Lab Results  Component Value Date   ALT 17 12/03/2021   AST 20 12/03/2021   ALKPHOS 81 12/03/2021   BILITOT 0.2 12/03/2021   Lab  Results  Component Value Date   WBC 5.9 12/03/2021   HGB 14.3 12/03/2021   HCT 43.3 12/03/2021   MCV 85 12/03/2021   PLT 147 (L) 12/03/2021   Lab Results  Component Value Date   LIPASE 35 12/03/2021   Lab Results  Component Value Date   CREATININE 1.03 12/03/2021   BUN 10 12/03/2021   NA 143 12/03/2021   K 4.2 12/03/2021   CL 107 (H) 12/03/2021   CO2 22 12/03/2021   Lab Results  Component Value Date   VITAMINB12 1,590 (H) 12/03/2021     Imaging Studies   CUP PACEART REMOTE DEVICE CHECK  Result Date: 03/18/2022 Scheduled remote reviewed. Normal device function.  8 AMS, 4-8sec in duration, PAT Next remote 91 days. LA   Assessment   RUQ pain: resolved.  Was having some postprandial right upper quadrant pain but also right mid chest pain.  Pain improved with starting inhalers.  He was feeling better at time of last office visit.  Since stopping sugar-free cookies his symptoms have completely subsided.  No longer having any bowel issues.  His reflux is much better as well.  No alarm symptoms at this time.  History of colon polyps: Patient not interested in future surveillance exams.   PLAN   Reinforced antireflux measures. Continue pantoprazole 40 mg once to twice daily before meals. He will call if he has any recurrent abdominal pain or change in bowel habits. Return to the office in 6 months or sooner if needed.   Laureen Ochs. Bobby Rumpf, [place], [facility]

## 2022-03-30 ENCOUNTER — Other Ambulatory Visit: Payer: Self-pay

## 2022-03-30 ENCOUNTER — Ambulatory Visit (INDEPENDENT_AMBULATORY_CARE_PROVIDER_SITE_OTHER): Payer: Medicare Other | Admitting: Family Medicine

## 2022-03-30 VITALS — BP 136/60 | HR 75 | Ht 69.0 in | Wt 206.0 lb

## 2022-03-30 DIAGNOSIS — I10 Essential (primary) hypertension: Secondary | ICD-10-CM

## 2022-03-30 DIAGNOSIS — R413 Other amnesia: Secondary | ICD-10-CM

## 2022-03-30 DIAGNOSIS — R251 Tremor, unspecified: Secondary | ICD-10-CM | POA: Diagnosis not present

## 2022-03-30 MED ORDER — MEMANTINE HCL 5 MG PO TABS: 5.0000 mg | ORAL_TABLET | Freq: Two times a day (BID) | ORAL | 3 refills | Status: DC

## 2022-03-30 NOTE — Progress Notes (Signed)
   Subjective:    Patient ID: Lance [name], male    DOB: [birth year], 76 y.o.   MRN: [id number]  Diabetes He presents for his follow-up diabetic visit. He has type 2 diabetes mellitus.  He actually follows with his diabetes with Dr. [name] He states A1c has been in the 8 range recently He also relates he has had a lot of memory issues where he finds himself unable to remember details sometimes forgets things he is familiar with but other times it is more so when people tell him stuff he cannot remember as well.  He states that if he tries to focus on his memory and he seems to do a little bit better he denies being depressed he is under a lot of anxiousness and stress related to work issues plus also home issues.  He has also noticed a lot of tremor in his hands becoming progressively worse.  Worse with movement having difficult time feeding himself also has noticed that his walking is slowing down   Review of Systems     Objective:   Physical Exam General-in no acute distress Eyes-no discharge Lungs-respiratory rate normal, CTA CV-no murmurs,RRR Extremities skin warm dry no edema Neuro grossly normal Behavior normal, alert  He does have tremors in both hands more pronounced on the right than the left he also has osteoarthritis of the hands in addition to this slight stiffness of the muscles but difficult to tell if this is true cogwheeling.  His walk is a shorter gait.  He states his balance is not as good as it used to be      Assessment & Plan:  1. Tremor of both hands Significant tremor of both hands recommend referral to neurology Need to rule out the possibility of Parkinson's disease.  If progressive troubles or other worsening issues follow-up with [place]. 2. Poor memory He is currently taking Aricept.  He has seen neurology before and they deemed his problem is poor short-term memory but did not label this as dementia.  With testing today he is able to recall 3 for 3 he is also  able to draw clock okay I tried to reassure him he is very concerned.  We will go ahead with Namenda low-dose twice daily.  Long-term efficacy of this medicine is subpar but could help him hold onto what he has a little longer I do not feel that he is in Alzheimer's category currently  3. Essential hypertension Blood pressure decent control continue current measures  4. Memory deficits I feel that he is safe to drive and safe to work.

## 2022-03-31 ENCOUNTER — Other Ambulatory Visit: Payer: Self-pay | Admitting: Family Medicine

## 2022-03-31 ENCOUNTER — Other Ambulatory Visit: Payer: Self-pay | Admitting: Urology

## 2022-03-31 DIAGNOSIS — N401 Enlarged prostate with lower urinary tract symptoms: Secondary | ICD-10-CM

## 2022-04-06 ENCOUNTER — Institutional Professional Consult (permissible substitution): Payer: Medicare Other | Admitting: Internal Medicine

## 2022-04-07 NOTE — Progress Notes (Signed)
Remote ICD transmission.   

## 2022-04-14 ENCOUNTER — Encounter: Payer: Self-pay | Admitting: Family Medicine

## 2022-04-14 ENCOUNTER — Ambulatory Visit (INDEPENDENT_AMBULATORY_CARE_PROVIDER_SITE_OTHER): Payer: Medicare Other | Admitting: Family Medicine

## 2022-04-14 VITALS — BP 146/70 | HR 75 | Temp 98.5°F | Wt 209.6 lb

## 2022-04-14 DIAGNOSIS — J019 Acute sinusitis, unspecified: Secondary | ICD-10-CM

## 2022-04-14 DIAGNOSIS — R6889 Other general symptoms and signs: Secondary | ICD-10-CM

## 2022-04-14 MED ORDER — DOXYCYCLINE HYCLATE 100 MG PO TABS: 100.0000 mg | ORAL_TABLET | Freq: Two times a day (BID) | ORAL | 0 refills | Status: DC

## 2022-04-14 NOTE — Progress Notes (Signed)
   Subjective:    Patient ID: Lance [name], male    DOB: Oct 13, 1945, 76 y.o.   MRN: [id number]  HPI Patient arrives today with cough, chest congestion, and dizziness.   Patient states at times his blood pressure has been low. Patient with onset of chest congestion cough feeling bad rundown denies high fever chills sweats does relate ear congestion sinus pressure and also relates slight chest congestion    Review of Systems     Objective:   Physical Exam Lungs clear hearts regular HEENT benign       Assessment & Plan:  Secondary rhinosinusitis Viral syndrome Triple swab taken Supportive measures discussed Follow-up if problems

## 2022-04-16 LAB — COVID-19, FLU A+B AND RSV
Influenza A, NAA: NOT DETECTED
Influenza B, NAA: NOT DETECTED
RSV, NAA: NOT DETECTED
SARS-CoV-2, NAA: NOT DETECTED

## 2022-04-21 ENCOUNTER — Other Ambulatory Visit: Payer: Self-pay | Admitting: Family Medicine

## 2022-04-21 MED ORDER — DOXYCYCLINE HYCLATE 100 MG PO TABS: 100.0000 mg | ORAL_TABLET | Freq: Two times a day (BID) | ORAL | 0 refills | Status: DC

## 2022-04-28 ENCOUNTER — Encounter: Payer: Self-pay | Admitting: Nurse Practitioner

## 2022-04-28 ENCOUNTER — Ambulatory Visit: Payer: Medicare Other | Attending: Nurse Practitioner | Admitting: Nurse Practitioner

## 2022-04-28 VITALS — BP 124/66 | HR 68 | Ht 69.0 in | Wt 211.0 lb

## 2022-04-28 DIAGNOSIS — I77819 Aortic ectasia, unspecified site: Secondary | ICD-10-CM

## 2022-04-28 DIAGNOSIS — I25119 Atherosclerotic heart disease of native coronary artery with unspecified angina pectoris: Secondary | ICD-10-CM

## 2022-04-28 DIAGNOSIS — R5383 Other fatigue: Secondary | ICD-10-CM

## 2022-04-28 DIAGNOSIS — E785 Hyperlipidemia, unspecified: Secondary | ICD-10-CM | POA: Diagnosis not present

## 2022-04-28 DIAGNOSIS — Z8673 Personal history of transient ischemic attack (TIA), and cerebral infarction without residual deficits: Secondary | ICD-10-CM | POA: Diagnosis not present

## 2022-04-28 DIAGNOSIS — Z8674 Personal history of sudden cardiac arrest: Secondary | ICD-10-CM | POA: Diagnosis not present

## 2022-04-28 DIAGNOSIS — Z95810 Presence of automatic (implantable) cardiac defibrillator: Secondary | ICD-10-CM

## 2022-04-28 DIAGNOSIS — R0602 Shortness of breath: Secondary | ICD-10-CM | POA: Diagnosis not present

## 2022-04-28 DIAGNOSIS — R42 Dizziness and giddiness: Secondary | ICD-10-CM | POA: Diagnosis not present

## 2022-04-28 DIAGNOSIS — I10 Essential (primary) hypertension: Secondary | ICD-10-CM

## 2022-04-28 NOTE — Patient Instructions (Addendum)
Medication Instructions:  Your physician recommends that you continue on your current medications as directed. Please refer to the Current Medication list given to you today.    Labwork: None today  Testing/Procedures: Your physician has requested that you have an echocardiogram. Echocardiography is a painless test that uses sound waves to create images of your heart. It provides your doctor with information about the size and shape of your heart and how well your heart's chambers and valves are working. This procedure takes approximately one hour. There are no restrictions for this procedure. Please do NOT wear cologne, perfume, aftershave, or lotions (deodorant is allowed). Please arrive 15 minutes prior to your appointment time.   Follow-Up: 2-3 months  Any Other Special Instructions Will Be Listed Below (If Applicable).   Increase physical activity as tolerated   Bring daily BP log to next appointment  If you need a refill on your cardiac medications before your next appointment, please call your pharmacy.   Mediterranean Diet A Mediterranean diet refers to food and lifestyle choices that are based on the traditions of countries located on the [employer]. It focuses on eating more fruits, vegetables, whole grains, beans, nuts, seeds, and heart-healthy fats, and eating less dairy, meat, eggs, and processed foods with added sugar, salt, and fat. This way of eating has been shown to help prevent certain conditions and improve outcomes for people who have chronic diseases, like kidney disease and heart disease. What are tips for following this plan? Reading food labels Check the serving size of packaged foods. For foods such as rice and pasta, the serving size refers to the amount of cooked product, not dry. Check the total fat in packaged foods. Avoid foods that have saturated fat or trans fats. Check the ingredient list for added sugars, such as corn syrup. Shopping  Buy  a variety of foods that offer a balanced diet, including: Fresh fruits and vegetables (produce). Grains, beans, nuts, and seeds. Some of these may be available in unpackaged forms or large amounts (in bulk). Fresh seafood. Poultry and eggs. Low-fat dairy products. Buy whole ingredients instead of prepackaged foods. Buy fresh fruits and vegetables in-season from local farmers markets. Buy plain frozen fruits and vegetables. If you do not have access to quality fresh seafood, buy precooked frozen shrimp or canned fish, such as tuna, salmon, or sardines. Stock your pantry so you always have certain foods on hand, such as olive oil, canned tuna, canned tomatoes, rice, pasta, and beans. Cooking Cook foods with extra-virgin olive oil instead of using butter or other vegetable oils. Have meat as a side dish, and have vegetables or grains as your main dish. This means having meat in small portions or adding small amounts of meat to foods like pasta or stew. Use beans or vegetables instead of meat in common dishes like chili or lasagna. Experiment with different cooking methods. Try roasting, broiling, steaming, and sauting vegetables. Add frozen vegetables to soups, stews, pasta, or rice. Add nuts or seeds for added healthy fats and plant protein at each meal. You can add these to yogurt, salads, or vegetable dishes. Marinate fish or vegetables using olive oil, lemon juice, garlic, and fresh herbs. Meal planning Plan to eat one vegetarian meal one day each week. Try to work up to two vegetarian meals, if possible. Eat seafood two or more times a week. Have healthy snacks readily available, such as: Vegetable sticks with hummus. Greek yogurt. Fruit and nut trail mix. Eat balanced meals throughout  the week. This includes: Fruit: 2-3 servings a day. Vegetables: 4-5 servings a day. Low-fat dairy: 2 servings a day. Fish, poultry, or lean meat: 1 serving a day. Beans and legumes: 2 or more servings  a week. Nuts and seeds: 1-2 servings a day. Whole grains: 6-8 servings a day. Extra-virgin olive oil: 3-4 servings a day. Limit red meat and sweets to only a few servings a month. Lifestyle  Cook and eat meals together with your family, when possible. Drink enough fluid to keep your urine pale yellow. Be physically active every day. This includes: Aerobic exercise like running or swimming. Leisure activities like gardening, walking, or housework. Get 7-8 hours of sleep each night. If recommended by your health care provider, drink red wine in moderation. This means 1 glass a day for nonpregnant women and 2 glasses a day for men. A glass of wine equals 5 oz (150 mL). What foods should I eat? Fruits Apples. Apricots. Avocado. Berries. Bananas. Cherries. Dates. Figs. Grapes. Lemons. Melon. Oranges. Peaches. Plums. Pomegranate. Vegetables Artichokes. Beets. Broccoli. Cabbage. Carrots. Eggplant. Green beans. Chard. Kale. Spinach. Onions. Leeks. Peas. Squash. Tomatoes. Peppers. Radishes. Grains Whole-grain pasta. Brown rice. Bulgur wheat. Polenta. Couscous. Whole-wheat bread. [place]. Meats and other proteins Beans. Almonds. Sunflower seeds. Pine nuts. Peanuts. [place]. Salmon. Scallops. Shrimp. [place]. Tilapia. Clams. Oysters. Eggs. Poultry without skin. Dairy Low-fat milk. Cheese. Greek yogurt. Fats and oils Extra-virgin olive oil. Avocado oil. Grapeseed oil. Beverages Water. Red wine. Herbal tea. Sweets and desserts Greek yogurt with honey. Baked apples. Poached pears. Trail mix. Seasonings and condiments Basil. Cilantro. Coriander. Cumin. Mint. Parsley. Sage. Rosemary. Tarragon. Garlic. Oregano. Thyme. Pepper. Balsamic vinegar. Tahini. Hummus. Tomato sauce. Olives. Mushrooms. The items listed above may not be a complete list of foods and beverages you can eat. Contact a dietitian for more information. What foods should I limit? This is a list of foods that should be eaten rarely or  only on special occasions. Fruits Fruit canned in syrup. Vegetables Deep-fried potatoes (french fries). Grains Prepackaged pasta or rice dishes. Prepackaged cereal with added sugar. Prepackaged snacks with added sugar. Meats and other proteins Beef. Pork. Lamb. Poultry with skin. Hot dogs. [place] Salines. Dairy Ice cream. Sour cream. Whole milk. Fats and oils Butter. Canola oil. Vegetable oil. Beef fat (tallow). Lard. Beverages Juice. Sugar-sweetened soft drinks. Beer. Liquor and spirits. Sweets and desserts Cookies. Cakes. Pies. Candy. Seasonings and condiments Mayonnaise. Pre-made sauces and marinades. The items listed above may not be a complete list of foods and beverages you should limit. Contact a dietitian for more information. Summary The Mediterranean diet includes both food and lifestyle choices. Eat a variety of fresh fruits and vegetables, beans, nuts, seeds, and whole grains. Limit the amount of red meat and sweets that you eat. If recommended by your health care provider, drink red wine in moderation. This means 1 glass a day for nonpregnant women and 2 glasses a day for men. A glass of wine equals 5 oz (150 mL). This information is not intended to replace advice given to you by your health care provider. Make sure you discuss any questions you have with your health care provider. Document Revised: 05/25/2019 Document Reviewed: 03/22/2019 [employer] Patient Education  [place].

## 2022-04-28 NOTE — Progress Notes (Signed)
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 He is due for this to be checked again. No red flag signs of symptoms. Was not addressed at this office visit, but plan to address discussing repeating US at next follow-up with me. Heart healthy diet recommended and increasing physical activity as tolerated encouraged.   Shortness of breath Has been ongoing, stable.  Attributes this to his fatigue and lethargy, see below.  Will arrange 2D echocardiogram at this time.  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 Recommended heart healthy diet and increasing physical activity as tolerated.  Follow-up with pulmonology as scheduled next month.  Fatigue, lethargy Etiology multifactorial. Has been ongoing and seems  to be getting worse over time.  This was his chief complaint during office visit.  Updating 2D echocardiogram as mentioned above.  Most recent labs overall stable.  Stop bang score today 5.  He has a reported negative sleep study in the past.  He defers a sleep study at this time.  He is on quite a few medications that could be making him drowsy and fatigued, I recommended he follow-up with his PCP regarding this. Heart healthy diet and regular cardiovascular exercise as tolerated encouraged.   Dizziness, vertigo? Does admit to rare symptoms that sound like vertigo, occasionally happens while standing or with walking.  I recommended that he follow-up with PCP regarding this.  May benefit from vertigo PT therapy.  Discussed conservative measures, and may consider obtaining orthostatics at next follow-up visit, even though he denied orthostatic dizziness.  Heart healthy diet recommended. Continue to f/u with Ortho.   Hx of TIA Denies any strokelike symptoms recently.  Continue aspirin and Plavix. Continue to follow with PCP. Heart healthy diet and regular cardiovascular exercise as tolerated encouraged.     10. Disposition: Follow-up with me in 2 to 3 months or sooner if anything changes.  Medication Adjustments/Labs and Tests Ordered: Current medicines are reviewed at length with the patient today.  Concerns regarding medicines are outlined above.  Orders Placed This Encounter  Procedures   ECHOCARDIOGRAM COMPLETE   No orders of the defined types were placed in this encounter.   Patient Instructions  Medication Instructions:  Your physician recommends that you continue on your current medications as directed. Please refer to the Current Medication list given to you today.    Labwork: None today  Testing/Procedures: Your physician has requested that you have an echocardiogram. Echocardiography is a painless test that uses sound waves to create images of your heart. It provides your doctor  with information about the size and shape of your heart and how well your heart's chambers and valves are working. This procedure takes approximately one hour. There are no restrictions for this procedure. Please do NOT wear cologne, perfume, aftershave, or lotions (deodorant is allowed). Please arrive 15 minutes prior to your appointment time.   Follow-Up: 2-3 months  Any Other Special Instructions Will Be Listed Below (If Applicable).   Increase physical activity as tolerated   Bring daily BP log to next appointment  If you need a refill on your cardiac medications before your next appointment, please call your pharmacy.   Mediterranean Diet A Mediterranean diet refers to food and lifestyle choices that are based on the traditions of countries located on the [employer]. It focuses on eating more fruits, vegetables, whole grains, beans, nuts, seeds, and heart-healthy fats, and eating less dairy, meat, eggs, and processed foods with added sugar, salt, and fat. This way of eating has been shown to help prevent certain conditions and improve outcomes for people who have chronic diseases, like kidney disease and heart disease. What are tips for following this plan? Reading food labels Check the serving size of packaged foods. For foods such as rice and pasta, the serving size refers to the amount of cooked product, not dry. Check the total fat in packaged foods. Avoid foods that have saturated fat or trans fats. Check the ingredient list for added sugars, such as corn syrup. Shopping  Buy a variety of foods that offer a balanced diet, including: Fresh fruits and vegetables (produce). Grains, beans, nuts, and seeds. Some of these may be available in unpackaged forms or large amounts (in bulk). Fresh seafood. Poultry and eggs. Low-fat dairy products. Buy whole ingredients instead of prepackaged foods. Buy fresh fruits and vegetables in-season from local farmers markets. Buy  plain frozen fruits and vegetables. If you do not have access to quality fresh seafood, buy precooked frozen shrimp or canned fish, such as tuna, salmon, or sardines. Stock your pantry so you always have certain foods on hand, such as olive oil, canned tuna, canned tomatoes, rice, pasta, and beans. Cooking Cook foods with extra-virgin olive oil instead of using butter or other vegetable oils. Have meat as a side dish, and have vegetables or grains as your main dish. This means having meat in small portions or adding small amounts of meat to foods like pasta or stew. Use beans or vegetables instead of meat in common dishes like chili or lasagna. Experiment with different cooking methods. Try roasting, broiling, steaming, and sauting vegetables. Add frozen vegetables to soups, stews, pasta, or rice. Add nuts or seeds for added healthy fats and plant protein at each meal. You can add these to yogurt, salads, or vegetable dishes. Marinate fish or vegetables using olive oil, lemon juice, garlic, and fresh herbs. Meal planning Plan to eat one vegetarian meal one day each week. Try to work up to two vegetarian meals, if possible. Eat seafood two or more times a week. Have healthy snacks readily available, such as: Vegetable sticks with hummus. Greek yogurt. Fruit and nut trail mix. Eat balanced meals throughout the week. This includes: Fruit: 2-3 servings a day. Vegetables: 4-5 servings a day. Low-fat dairy: 2 servings a day. Fish, poultry, or lean meat: 1 serving a day. Beans and legumes:  2 or more servings a week. Nuts and seeds: 1-2 servings a day. Whole grains: 6-8 servings a day. Extra-virgin olive oil: 3-4 servings a day. Limit red meat and sweets to only a few servings a month. Lifestyle  Cook and eat meals together with your family, when possible. Drink enough fluid to keep your urine pale yellow. Be physically active every day. This includes: Aerobic exercise like running or  swimming. Leisure activities like gardening, walking, or housework. Get 7-8 hours of sleep each night. If recommended by your health care provider, drink red wine in moderation. This means 1 glass a day for nonpregnant women and 2 glasses a day for men. A glass of wine equals 5 oz (150 mL). What foods should I eat? Fruits Apples. Apricots. Avocado. Berries. Bananas. Cherries. Dates. Figs. Grapes. Lemons. Melon. Oranges. Peaches. Plums. Pomegranate. Vegetables Artichokes. Beets. Broccoli. Cabbage. Carrots. Eggplant. Green beans. Chard. Kale. Spinach. Onions. Leeks. Peas. Squash. Tomatoes. Peppers. Radishes. Grains Whole-grain pasta. Brown rice. Bulgur wheat. Polenta. Couscous. Whole-wheat bread. [place]. Meats and other proteins Beans. Almonds. Sunflower seeds. Pine nuts. Peanuts. [place]. Salmon. Scallops. Shrimp. [place]. Tilapia. Clams. Oysters. Eggs. Poultry without skin. Dairy Low-fat milk. Cheese. Greek yogurt. Fats and oils Extra-virgin olive oil. Avocado oil. Grapeseed oil. Beverages Water. Red wine. Herbal tea. Sweets and desserts Greek yogurt with honey. Baked apples. Poached pears. Trail mix. Seasonings and condiments Basil. Cilantro. Coriander. Cumin. Mint. Parsley. Sage. Rosemary. Tarragon. Garlic. Oregano. Thyme. Pepper. Balsamic vinegar. Tahini. Hummus. Tomato sauce. Olives. Mushrooms. The items listed above may not be a complete list of foods and beverages you can eat. Contact a dietitian for more information. What foods should I limit? This is a list of foods that should be eaten rarely or only on special occasions. Fruits Fruit canned in syrup. Vegetables Deep-fried potatoes (french fries). Grains Prepackaged pasta or rice dishes. Prepackaged cereal with added sugar. Prepackaged snacks with added sugar. Meats and other proteins Beef. Pork. Lamb. Poultry with skin. Hot dogs. [place] Salines. Dairy Ice cream. Sour cream. Whole milk. Fats and oils Butter. Canola oil.  Vegetable oil. Beef fat (tallow). Lard. Beverages Juice. Sugar-sweetened soft drinks. Beer. Liquor and spirits. Sweets and desserts Cookies. Cakes. Pies. Candy. Seasonings and condiments Mayonnaise. Pre-made sauces and marinades. The items listed above may not be a complete list of foods and beverages you should limit. Contact a dietitian for more information. Summary The Mediterranean diet includes both food and lifestyle choices. Eat a variety of fresh fruits and vegetables, beans, nuts, seeds, and whole grains. Limit the amount of red meat and sweets that you eat. If recommended by your health care provider, drink red wine in moderation. This means 1 glass a day for nonpregnant women and 2 glasses a day for men. A glass of wine equals 5 oz (150 mL). This information is not intended to replace advice given to you by your health care provider. Make sure you discuss any questions you have with your health care provider. Document Revised: 05/25/2019 Document Reviewed: 03/22/2019 Elsevier Patient Education  Hudson, Finis Bud, NP  04/28/2022 12:49 PM    [place]

## 2022-04-30 ENCOUNTER — Other Ambulatory Visit: Payer: Self-pay | Admitting: Urology

## 2022-05-05 ENCOUNTER — Other Ambulatory Visit: Payer: Self-pay | Admitting: Family Medicine

## 2022-05-07 ENCOUNTER — Ambulatory Visit: Payer: Medicare Other | Attending: Nurse Practitioner

## 2022-05-07 DIAGNOSIS — R0602 Shortness of breath: Secondary | ICD-10-CM

## 2022-05-07 LAB — ECHOCARDIOGRAM COMPLETE
AR max vel: 2.36 cm2
AV Peak grad: 5.8 mmHg
Ao pk vel: 1.21 m/s
Area-P 1/2: 4.06 cm2
Calc EF: 51.9 %
MV M vel: 2.05 m/s
MV Peak grad: 16.8 mmHg
S' Lateral: 3.1 cm
Single Plane A2C EF: 54.9 %
Single Plane A4C EF: 50.5 %

## 2022-05-11 ENCOUNTER — Other Ambulatory Visit: Payer: Self-pay

## 2022-05-11 DIAGNOSIS — J441 Chronic obstructive pulmonary disease with (acute) exacerbation: Secondary | ICD-10-CM

## 2022-05-11 MED ORDER — STIOLTO RESPIMAT 2.5-2.5 MCG/ACT IN AERS: 2.0000 | INHALATION_SPRAY | Freq: Every day | RESPIRATORY_TRACT | 1 refills | Status: AC

## 2022-05-20 ENCOUNTER — Ambulatory Visit (INDEPENDENT_AMBULATORY_CARE_PROVIDER_SITE_OTHER): Payer: Medicare Other | Admitting: Family Medicine

## 2022-05-20 ENCOUNTER — Telehealth: Payer: Self-pay | Admitting: Nurse Practitioner

## 2022-05-20 ENCOUNTER — Encounter: Payer: Self-pay | Admitting: Family Medicine

## 2022-05-20 VITALS — BP 127/69 | HR 80 | Temp 98.2°F | Wt 217.6 lb

## 2022-05-20 DIAGNOSIS — J019 Acute sinusitis, unspecified: Secondary | ICD-10-CM | POA: Diagnosis not present

## 2022-05-20 DIAGNOSIS — Z1211 Encounter for screening for malignant neoplasm of colon: Secondary | ICD-10-CM | POA: Diagnosis not present

## 2022-05-20 MED ORDER — CEFPROZIL 500 MG PO TABS: 500.0000 mg | ORAL_TABLET | Freq: Two times a day (BID) | ORAL | 0 refills | Status: DC

## 2022-05-20 NOTE — Progress Notes (Signed)
   Subjective:    Patient ID: Lance [name], male    DOB: [birth year], 77 y.o.   MRN: [id number]  HPI  Patient presents today with respiratory illness Number of days present-2 to 2.5 weeks  Symptoms include- chest congestion, nausea, fatigue  Presence of worrisome signs (severe shortness of breath, lethargy, etc.) - shortness of breath, wheezing   Recent/current visit to urgent care or ER- no  Recent direct exposure to Covid- no  Any current Covid testing- no    Review of Systems     Objective:   Physical Exam  Gen-NAD not toxic TMS-normal bilateral T- normal no redness Chest-CTA respiratory rate normal no crackles CV RRR no murmur Skin-warm dry Neuro-grossly normal No respiratory distress      Assessment & Plan:   Moderate persistent sinusitis another round of antibiotics indicated if progressive troubles or worse follow-up Will be seeing pulmonary in the near future may need a follow-up chest x-ray still coughing I believe his cough is more so from a postnasal drainage  Patient gets his endocrinology through Dr.[facility] endocrinology we will try to get the most recent A1c sent here so we can log into his records

## 2022-05-20 NOTE — Telephone Encounter (Signed)
Wife answered and said patient was at work. Advised that echo results reviewed with patient yesterday

## 2022-05-20 NOTE — Telephone Encounter (Signed)
Patient called requesting test results of recent Echo.

## 2022-05-21 ENCOUNTER — Encounter: Payer: Self-pay | Admitting: *Deleted

## 2022-05-21 DIAGNOSIS — M542 Cervicalgia: Secondary | ICD-10-CM | POA: Diagnosis not present

## 2022-05-21 DIAGNOSIS — M47892 Other spondylosis, cervical region: Secondary | ICD-10-CM | POA: Diagnosis not present

## 2022-05-22 ENCOUNTER — Other Ambulatory Visit: Payer: Self-pay | Admitting: Family Medicine

## 2022-05-26 ENCOUNTER — Ambulatory Visit: Payer: Medicare Other | Admitting: Diagnostic Neuroimaging

## 2022-05-26 ENCOUNTER — Encounter: Payer: Self-pay | Admitting: Diagnostic Neuroimaging

## 2022-05-26 VITALS — BP 84/51 | HR 65 | Ht 69.0 in | Wt 212.0 lb

## 2022-05-26 DIAGNOSIS — R251 Tremor, unspecified: Secondary | ICD-10-CM | POA: Diagnosis not present
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
## 2022-05-26 NOTE — Patient Instructions (Signed)
TREMOR (essential tremor; mainly postural action tremor) - subtle parkinsonism (bradykinesia, gait diff; rare resting tremor) - check DATscan - could consider primidone (already on metoprolol; caution with polypharmacy)  GAIT DIFFICULTY - could be sequelae from cervical spine dz; concern for myelopathy; consider follow up with Dr. 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

## 2022-06-01 NOTE — Progress Notes (Unsigned)
Lance [name], male    DOB: [birth year]    MRN: [id number]   Brief patient profile:  35   yowm quit smoking 2000 due to heart with nl pfts as of 03/21/14 referred to pulmonary clinic 10/03/2018 by Dr   Wolfgang Phoenix for cough x around 2013 (? p 2 neck surgeries)  never resolved (w/u by [facility] 3154 neg )  then doe 2015 and already had cards eval with last [place] 07/2018:  ostial LAD occluded, LCX occluded, RCA mid 75%. RPDA 90% chronic. LIMA-LAD patent, SVG-OM patent, SVG-diag patent,  SVG-RPDA occluded. The RCA was ballooned. LVEDP 14 so referred to pulmonary.  Note neg w/u for lung dz by [facility] in 2016 and no response to spiriva so d/c'd    History of Present Illness  10/03/2018  Pulmonary/ 1st office eval/[name]  Chief Complaint  Patient presents with   Shortness of Breath    Also feels like he is choking when eating. Will get tired very easily, has been seen by cardiology.   Dyspnea:  100 ft / slow = MMRC3 = can't walk 100 yards even at a slow pace at a flat grade s stopping due to sob  s variability Cough: dry hack 24/7 / globus sensations with dysphagia  / does not exac in am  Sleep: flat bed/ one pillow / some choking / smothering at hs / SABA use: no better and worse cough with  symbicort  so not using,  even neb does not help Uses allegra daily, not clearly helping "keep a mint in my mouth all the time" and seems to help throat "feel better" but not the cough rec Stop all mints and all inhalers Increase Protonix 40 mg Take 30- 60 min before your first and last meals of the day  GERD diet   10/23/2018  f/u ov/Kyriaki Moder re: sob Chief Complaint  Patient presents with   Follow-up    Breathing has not improved. He stopped using rescue inhaler and neb b/c they were not helping.   Dyspnea:  Straight line no trouble with doe, some problem with steps so = MMRC1 = can walk nl pace, flat grade, can't hurry or go uphills or steps s sob   Cough: globus sensation, not really coughing  Sleeping: 2-3 nights  per weeks wakes up with choking / smothering and gets up and walks around and back to sleep ok SABA use: not helping  02: none  rec Ok stop all inhalers and nebulizers at this point as you don't feel they are helping We will be referring you back to Dr Mare Loan for your sensation of something stuck in your throat     [name] NP 12/08/2018 with pfts nl x minimal concavity [facility]  rec  Trial Spiriva - take 2 puffs once daily (sample given) Albuterol rescue inahler- take 2 puffs every 6 hours as needed only for shortness of breath    01/09/2019  f/u ov/[name] re: sob no better on spiriva  Chief Complaint  Patient presents with   Follow-up    PFT's done 12/08/18.  Breathing is unchanged.   Dyspnea:  Tired more than sob, no change with lama or saba / really limited by back and legs  Cough: sensation of globus better with gerd per ENT   Sleeping: on side 10-15 degrees hob with bed blocks SABA use: still using bid s benefit  02: none Rec Ideally protonix should be Take 30- 60 min before your first and last meals of the day  Stop spiriva  Keep the albuterol handy if needed for an attack of breathing  To get the most out of exercise, you need to be continuously aware that you are short of breath, but never out of breath, for 30 minutes daily. Pulmonary follow up is as needed     06/02/2022  Re-establish  ov/ office/[name] re: doe/cp/cough maint on ***  Chief Complaint  Patient presents with   Consult    Ref for COPD SOB for a few years   Dyspnea:  MMRC3 = can't walk 100 yards even at a slow pace at a flat grade s stopping due to sob Onset around 6-8 months  R cp ant while eating / sitting never lying down and not exertional or pleuritic   Cough: worse p supper and hs  Sleeping: L side down helps / electric bed  SABA use: not really hampi 02: no oxygen  Covid status: vax x 2/ never infected      No obvious day to day or daytime variability or assoc excess/ purulent sputum or mucus plugs  or hemoptysis or  chest tightness, subjective wheeze or overt sinus or hb symptoms.     Also denies any obvious fluctuation of symptoms with weather or environmental changes or other aggravating or alleviating factors except as outlined above   No unusual exposure hx or h/o childhood pna/ asthma or knowledge of premature birth.  Current Allergies, Complete Past Medical History, Past Surgical History, Family History, and Social History were reviewed in [employer] record.  ROS  The following are not active complaints unless bolded Hoarseness, sore throat, dysphagia/globus, dental problems, itching, sneezing,  nasal congestion or discharge of excess mucus or purulent secretions, ear ache,   fever, chills, sweats, unintended wt loss or wt gain, classically pleuritic or exertional cp,  orthopnea pnd or arm/hand swelling  or leg swelling, presyncope, palpitations, abdominal pain, anorexia, nausea, vomiting, diarrhea  or change in bowel habits or change in bladder habits, change in stools or change in urine, dysuria, hematuria,  rash, arthralgias, visual complaints, headache, numbness, weakness or ataxia or problems with walking or coordination,  change in mood or  memory.        Current Meds -  - NOTE:   Unable to verify as accurately reflecting what pt takes    Medication Sig   albuterol (VENTOLIN HFA) 108 (90 Base) MCG/ACT inhaler Inhale 2 puffs into the lungs every 6 (six) hours as needed for wheezing or shortness of breath.   alfuzosin (UROXATRAL) 10 MG 24 hr tablet Take 1 tablet (10 mg total) by mouth daily.   ALPRAZolam (XANAX) 0.5 MG tablet TAKE ONE TABLET BY MOUTH TWICE A DAY   aspirin EC 81 MG tablet Take 81 mg by mouth daily.   cefPROZIL (CEFZIL) 500 MG tablet Take 1 tablet (500 mg total) by mouth 2 (two) times daily. For 10 days   Cholecalciferol 125 MCG (5000 UT) TABS Take 1 tablet by mouth daily.   clopidogrel (PLAVIX) 75 MG tablet TAKE ONE (1) TABLET BY MOUTH EVERY  DAY   Coenzyme Q10-Vitamin E (QUNOL ULTRA COQ10 PO) Take 1 tablet by mouth daily.   donepezil (ARICEPT) 10 MG tablet TAKE ONE TABLET BY MOUTH EVERY NIGHT AT BEDTIME   ezetimibe (ZETIA) 10 MG tablet Take 1 tablet (10 mg total) by mouth daily.   FARXIGA 10 MG TABS tablet Take 10 mg by mouth daily.   fexofenadine (ALLEGRA) 180 MG tablet Take 180 mg by mouth  daily.   HUMALOG KWIKPEN 100 UNIT/ML KiwkPen Inject 0.32-0.38 mLs (32-38 Units total) into the skin 3 (three) times daily. (Patient taking differently: Inject 25 Units into the skin 3 (three) times daily before meals.)   HYDROcodone-acetaminophen (NORCO) 7.5-325 MG tablet Take 0.5-1 tablets by mouth every 12 (twelve) hours as needed for moderate pain.   Insulin Detemir (LEVEMIR FLEXTOUCH) 100 UNIT/ML Pen Inject 90 Units into the skin daily at 10 pm. (Patient taking differently: Inject 100 Units into the skin at bedtime.)   Insulin Pen Needle (B-D ULTRAFINE III SHORT PEN) 31G X 8 MM MISC USE 1 NEEDLE 4 TIMES DAILY AS DIRECTED   losartan (COZAAR) 25 MG tablet Take 25 mg by mouth daily.   memantine (NAMENDA) 5 MG tablet Take 1 tablet (5 mg total) by mouth 2 (two) times daily.   metoprolol tartrate (LOPRESSOR) 25 MG tablet Take 0.5 tablets (12.5 mg total) by mouth 2 (two) times daily.   montelukast (SINGULAIR) 10 MG tablet TAKE 1 TABLET BY MOUTH AT  BEDTIME   MYRBETRIQ 25 MG TB24 tablet TAKE ONE TABLET ('25MG'$  TOTAL) BY MOUTH DAILY   nitroGLYCERIN (NITROSTAT) 0.4 MG SL tablet PLACE ONE TABLET (0.'4MG'$  TOTAL) UNDER THETONGUE EVERY FIVE MINUTES AS NEEDED FOR CHEST PAIN   ONETOUCH ULTRA test strip USE 1 STRIP TO CHECK GLUCOSE 4 TIMES DAILY   pantoprazole (PROTONIX) 40 MG tablet TAKE ONE TABLET BY MOUTH ONCE DAILY 30 MINUTES BEFORE BREAKFAST. MAY TAKE A SECOND DOSE 30 MINUTES BEFORE DINNER IF NEEDED.   Probiotic Product (ALIGN PO) Take 1 tablet by mouth daily.    rosuvastatin (CRESTOR) 40 MG tablet TAKE ONE (1) TABLET BY MOUTH EVERY DAY   sertraline (ZOLOFT)  50 MG tablet TAKE ONE (1) TABLET BY MOUTH EVERY DAY   tadalafil (CIALIS) 20 MG tablet Take 1 tablet (20 mg total) by mouth daily as needed for erectile dysfunction.   Tiotropium Bromide-Olodaterol (STIOLTO RESPIMAT) 2.5-2.5 MCG/ACT AERS Inhale 2 puffs into the lungs daily.   vitamin B-12 (CYANOCOBALAMIN) 1000 MCG tablet Take 6,000 mcg by mouth daily.             Past Medical History:  Diagnosis Date   AICD (automatic cardioverter/defibrillator) present 2005   St [name] ICD, for SCD   Allergic rhinitis, cause unspecified    Anxiety    Arthritis    "all over" (09/15/2016)   ASCVD (arteriosclerotic cardiovascular disease)    70% mid left anterior descending lesion on cath in 06/1995; left anterior desending DES placed in 8/03 and RCA stent in 9/03; captain 3/05 revealed 90% second marginal for which PCI was performed, 70% PDA and a total obstruction of the first diagonal and marginal; sudden cardiac death in [place] in 2003-10-15 for which automatic implantable cardiac defibrillator placed; negativ stress nuclear 10/07   Atrial fibrillation ([place])    Benign prostatic hypertrophy    C. difficile diarrhea    CAD (coronary artery disease) 1997   CHF (congestive heart failure) ([place])    Chronic lower back pain    COPD (chronic obstructive pulmonary disease) ([place])    Coronary artery disease    a. s/p CABG in 2013 with LIMA-LAD, SVG-D1, SVG-OM, and SVG-PDA b. DES to SVG-PDA in 2016 c. DES to mid-RCA in 08/2016   CVD (cerebrovascular disease) 05/2008   Transient ischemic attack; carotid ultrasound-plaque without focal disease   Degenerative joint disease 2002   C-spine fusion    Depression    Diabetes mellitus without mention of complication  Diabetic peripheral neuropathy ([place]) 09/06/2014   Erectile dysfunction    Esophageal reflux    Headache    "monthly" (09/15/2016)   HOH (hard of hearing)    Hx-TIA (transient ischemic attack) 2010   Hyperlipidemia    Hypertension    Memory deficits  09/05/2013   Myocardial infarction [facility]) 1995   Other testicular hypofunction    Pacemaker    S/P endoscopy Dec 2011   RMR: nl esophagus, hyperplastic polyp, active gastritis, no H.pylori.    Shortness of breath    Stroke [facility]) 2014   denies residual on 09/15/2016   Tobacco abuse    100 pack/year comsuption; cigarettes discontinued 2003; all tobacco products in 2008   Tubular adenoma    Type II diabetes mellitus (HCC)    Insulin requirement   Vitreous floaters of left eye       Objective:   Wts  06/02/2022        216   01/09/2019          221   10/23/18 216 lb (98 kg)  10/03/18 220 lb (99.8 kg)  08/18/18 222 lb (100.7 kg)    Vital signs reviewed  06/02/2022  - Note at rest 02 sats  96% on RA   General appearance:    very somber  amb wm/ rattling cough        HEENT : Oropharynx  clear/ edentulous      NECK :  without  apparent JVD/ palpable Nodes/TM    LUNGS: no acc muscle use,  Nl contour chest which is clear to A and P bilaterally without cough on insp or exp maneuvers   CV:  RRR  no s3 or murmur or increase in P2, and no edema   ABD: ,obese soft and nontender with nl inspiratory excursion in the supine position. No bruits or organomegaly appreciated   MS:  slt awkward gait/ ext warm without deformities Or obvious joint restrictions  calf tenderness, cyanosis or clubbing    SKIN: warm and dry without lesions    NEURO:  alert, approp, nl sensorium with  no motor or cerebellar deficits apparent.          Assessment

## 2022-06-02 ENCOUNTER — Telehealth: Payer: Self-pay | Admitting: Diagnostic Neuroimaging

## 2022-06-02 ENCOUNTER — Ambulatory Visit: Payer: Medicare Other | Admitting: Internal Medicine

## 2022-06-02 ENCOUNTER — Encounter: Payer: Self-pay | Admitting: *Deleted

## 2022-06-02 ENCOUNTER — Encounter: Payer: Self-pay | Admitting: Internal Medicine

## 2022-06-02 VITALS — BP 136/86 | HR 80 | Ht 69.0 in | Wt 216.2 lb

## 2022-06-02 DIAGNOSIS — R058 Other specified cough: Secondary | ICD-10-CM | POA: Diagnosis not present

## 2022-06-02 DIAGNOSIS — R0609 Other forms of dyspnea: Secondary | ICD-10-CM | POA: Diagnosis not present

## 2022-06-02 DIAGNOSIS — R0789 Other chest pain: Secondary | ICD-10-CM | POA: Diagnosis not present

## 2022-06-02 NOTE — Assessment & Plan Note (Signed)
?  Onset p second neck sugery ? Early 2000's with neg w/u By [facility] including MBS/DgEs -  CT Sinus (part of head ct) 06/28/18  wnl  - 10/03/2018 reported at pulmonary eval continuous use of daytime peppermint > rec d/c  - Allergy profile 10/03/2018 >  Eos 0.1/  IgE  104  RAST pos to everything but mold   Suspect multifactorial > convinced stiolto helped so ok to continue for now but change ppi to pepcid given the miriad of abd sympotms which suggest IBS and may be made worse by using them (see separate a/p)    Each maintenance medication was reviewed in detail including emphasizing most importantly the difference between maintenance and prns and under what circumstances the prns are to be triggered using an action plan format where appropriate.  Total time for H and P, chart review, counseling, reviewing smi device(s) , directly observing portions of ambulatory 02 saturation study/ and generating customized AVS unique to this office visit / same day charting  > 60 min with pt not seen in over 3 years for multiple refractory resp/chest symptoms of ? Etiology

## 2022-06-02 NOTE — Patient Instructions (Addendum)
Continue stioto 2 puffs each am but work hard on technique:  Work on inhaler technique:  relax and gently blow all the way out then take a nice smooth full deep breath back in, triggering the inhaler at same time you start breathing in.  Hold breath in for at least  5 seconds if you can.      Stop pantoprazole and start pepcid 20 mg after bfast and supper   GERD (REFLUX)  is an extremely common cause of respiratory symptoms just like yours , many times with no obvious heartburn at all.    It can be treated with medication, but also with lifestyle changes including elevation of the head of your bed (ideally with 6-8inch blocks under the headboard of your bed),  Smoking cessation, avoidance of late meals, excessive alcohol, and avoid fatty foods, chocolate, peppermint, colas, red wine, and acidic juices such as orange juice.  NO MINT OR MENTHOL PRODUCTS SO NO COUGH DROPS  USE SUGARLESS CANDY INSTEAD (Jolley ranchers or Stover's or Life Savers) or even ice chips will also do - the key is to swallow to prevent all throat clearing. NO OIL BASED VITAMINS - use powdered substitutes.  Avoid fish oil when coughing.   Chest  pain pattern suggests it is gas pain:  Treatment consists of avoiding foods that cause gas (especially boiled eggs, mexcican food but especially  beans and undercooked vegetables like  spinach and some salads/ dairy products)   and start citrucel 1 heaping tsp twice daily with a large glass of water.  Pain should improve w/in 2 week   Please remember to go to the lab department   for your tests - we will call you with the results when they are available.      Please schedule a follow up office visit in 2 weeks, call sooner if needed with all medications /inhalers/ solutions in hand so we can verify exactly what you are taking. This includes all medications from all doctors and over the [place] separate them into two bags:  the ones you take automatically, no matter what, vs the  ones you take just when you feel you need them "BAG #2 is UP TO YOU"  - this will really help [place] help you take your medications more effectively.

## 2022-06-02 NOTE — Assessment & Plan Note (Signed)
Onset 2015 Echo 08/2013:  EF 45%, mod RV dysfxn, normal PA pressures, moderate LAE CXR 03/2014:  No acute process PFTs 2013:  FEV1 2.96 (88%), ratio 76, TLC 6.75 (99%), DLCO 28.59 (92%) PFTss 03/2014:  FEV1 2.82 (86%), ratio 83, TLC 5.39 (79%), DLCO 22.89 (73%) - 10/03/2018   Walked RA  2 laps @  approx 288f each @ nl pace  stopped due to  End of study, no sob or cough or desats   10/23/2018 reported "as long as walk in straight line, no sob at all, just has it if turns head assoc with dizziness  - PFT's 12/08/2018  FEV1 3.10 (99), ratio 0.81, no BD response, normal DLCO, TLC 5.04 (73%), mild curvature on flow volume loop - Trial Spiriva respimat 2.5 >no change 01/09/2019 so d/c'd permanently  - 01/09/2019   Walked RA  2 laps @  approx 2567feach @ mod to fast pace  stopped due to  End of study, sob with sats still 99%  > pulmonary f/u prn - 06/02/2022  After extensive coaching inhaler device,  effectiveness =   75% with smi > continue stioto 2 each am  - 06/02/2022   Walked on RA  x 2  lap(s) =  approx 300  ft  @ mod pace, stopped due to sob and dizzy  with lowest 02 sats 95%   Symptoms are markedly disproportionate to objective findings and not clear to what extent this is actually a pulmonary  problem but pt does appear to have difficult to sort out respiratory symptoms of unknown origin for which  DDX  = almost all start with A and  include Adherence, Ace Inhibitors, Acid Reflux, Active Sinus Disease, Alpha 1 Antitripsin deficiency, Anxiety masquerading as Airways dz,  ABPA,  Allergy(esp in young), Aspiration (esp in elderly), Adverse effects of meds,  Active smoking or Vaping, A bunch of PE's/clot burden (a few small clots can't cause this syndrome unless there is already severe underlying pulm or vascular dz with poor reserve),  Anemia or thyroid disorder, plus two Bs  = Bronchiectasis and Beta blocker use..and one C= CHF    Adherence is always the initial "prime suspect" and is a multilayered concern that  requires a "trust but verify" approach in every patient - starting with knowing how to use medications, especially inhalers, correctly, keeping up with refills and understanding the fundamental difference between maintenance and prns vs those medications only taken for a very short course and then stopped and not refilled.  - see inhaler teaching above - return with all meds in hand using a trust but verify approach to confirm accurate Medication  Reconciliation The principal here is that until we are certain that the  patients are doing what we've asked, it makes no sense to ask them to do more.   ? Acid (or non-acid) GERD > always difficult to exclude as up to 75% of pts in some series report no assoc GI/ Heartburn symptoms and he has atypical cp which could be GERD or IBS > rec  change to pepcid 20 mg bid and rx diet for gerd and ibs plus trial of citrucel x 2 weeks   ? Anxiety/depression /deconditioning  > usually at the bottom of this list of usual suspects but should be much higher on this pt's based on H and P and note already on psychotropics and may interfere with adherence and also interpretation of response or lack thereof to symptom management which can be quite  subjective  Anemia/ thyroid dz > ruled out today   ? A bunch of PEs > D dimer nl - while a normal  or high normal value (seen commonly in the elderly or chronically ill)  may miss small peripheral pe, the clot burden with sob is moderately high and the d dimer  has a very high neg pred value if used in this setting.    ? Chf > ruled out with bnp < 100 today .

## 2022-06-02 NOTE — Telephone Encounter (Signed)
[employer] medicare [name]: [id number] exp. 07/17/22 sent to [facility] nuclear med [phone]

## 2022-06-02 NOTE — Assessment & Plan Note (Addendum)
Onset summer 2023 - trial of rx as IBS 06/02/2022 >>>   Classic subdiaphragmatic pain pattern suggests ibs:  Stereotypical, migratory with a very limited distribution of pain locations, daytime, not usually exacerbated by exercise  or coughing, worse in sitting position, frequently associated with generalized abd bloating, not as likely to be present supine due to the dome effect of the diaphragm which  is  canceled in that position. Frequently these patients have had multiple negative GI workups and CT scans.  Treatment consists of avoiding foods that cause gas (especially boiled eggs, mexcican food but especially  beans and undercooked vegetables like  spinach and some salads)  and citrucel 1 heaping tsp twice daily with a large glass of water.  Pain should improve w/in 2 weeks and if not then consider additional w/u.

## 2022-06-03 ENCOUNTER — Encounter: Payer: Self-pay | Admitting: Internal Medicine

## 2022-06-03 LAB — CBC WITH DIFFERENTIAL/PLATELET
Basophils Absolute: 0 10*3/uL (ref 0.0–0.2)
Basos: 1 %
EOS (ABSOLUTE): 0.1 10*3/uL (ref 0.0–0.4)
Eos: 2 %
Hematocrit: 40.4 % (ref 37.5–51.0)
Hemoglobin: 13 g/dL (ref 13.0–17.7)
Immature Grans (Abs): 0 10*3/uL (ref 0.0–0.1)
Immature Granulocytes: 0 %
Lymphocytes Absolute: 1.7 10*3/uL (ref 0.7–3.1)
Lymphs: 32 %
MCH: 28.9 pg (ref 26.6–33.0)
MCHC: 32.2 g/dL (ref 31.5–35.7)
MCV: 90 fL (ref 79–97)
Monocytes Absolute: 0.4 10*3/uL (ref 0.1–0.9)
Monocytes: 9 %
Neutrophils Absolute: 2.9 10*3/uL (ref 1.4–7.0)
Neutrophils: 56 %
Platelets: 114 10*3/uL — ABNORMAL LOW (ref 150–450)
RBC: 4.5 x10E6/uL (ref 4.14–5.80)
RDW: 13.5 % (ref 11.6–15.4)
WBC: 5.2 10*3/uL (ref 3.4–10.8)

## 2022-06-03 LAB — BASIC METABOLIC PANEL
BUN/Creatinine Ratio: 14 (ref 10–24)
BUN: 13 mg/dL (ref 8–27)
CO2: 19 mmol/L — ABNORMAL LOW (ref 20–29)
Calcium: 9.2 mg/dL (ref 8.6–10.2)
Chloride: 101 mmol/L (ref 96–106)
Creatinine, Ser: 0.91 mg/dL (ref 0.76–1.27)
Glucose: 440 mg/dL — ABNORMAL HIGH (ref 70–99)
Potassium: 4.5 mmol/L (ref 3.5–5.2)
Sodium: 135 mmol/L (ref 134–144)
eGFR: 88 mL/min/{1.73_m2} (ref >59)

## 2022-06-03 LAB — BRAIN NATRIURETIC PEPTIDE: BNP: 80.4 pg/mL (ref 0.0–100.0)

## 2022-06-03 LAB — TSH: TSH: 3.33 u[IU]/mL (ref 0.450–4.500)

## 2022-06-03 LAB — SEDIMENTATION RATE: Sed Rate: 6 mm/hr (ref 0–30)

## 2022-06-03 LAB — D-DIMER, QUANTITATIVE: D-DIMER: 0.53 mg/L FEU — ABNORMAL HIGH (ref 0.00–0.49)

## 2022-06-04 ENCOUNTER — Telehealth: Payer: Self-pay

## 2022-06-04 ENCOUNTER — Other Ambulatory Visit: Payer: Self-pay | Admitting: Family Medicine

## 2022-06-04 MED ORDER — DIPHENOXYLATE-ATROPINE 2.5-0.025 MG PO TABS: ORAL_TABLET | ORAL | 0 refills | Status: DC

## 2022-06-04 NOTE — Telephone Encounter (Signed)
Lance [name] called and needs something called in for diarrhea he said he thinks he has had food poison. Pt has tried over the counter pepto bismol and antidiarrhea meds and nothing is helping.  Pharmacy [facility]

## 2022-06-04 NOTE — Telephone Encounter (Signed)
Pt contacted. Pt states he ate 2 burgers from [facility] and within a few hours began to feel sick on stomach; began to use the bathroom a lot. Pt states his stomach is cramping. Pt not having bloody or mucus stool. Advised pt to go to ER over weekend if bloody or mucus stool and/or severe abdominal pain/cramps. Call office Monday if no better. Pt verbalized understanding.

## 2022-06-04 NOTE — Telephone Encounter (Signed)
If his diarrhea is bloody or if he is any fevers with severe abdominal pain he needs to go to urgent care or ER to get it checked out for the past belly of C. difficile If the diarrhea gets worse over the weekend with severe abdominal cramping mucus or blood or fever he needs to go to the urgent care or ER  If diarrhea has not checked up by Monday we will need to do stool testing proceed to self We will send in Lomotil that he could try for the diarrhea (I will go ahead and send in Lomotil that he can use if the diarrhea is just watery)

## 2022-06-07 ENCOUNTER — Emergency Department (HOSPITAL_COMMUNITY)
Admission: EM | Admit: 2022-06-07 | Discharge: 2022-06-07 | Disposition: A | Discharge status: Home / Self Care | Payer: Medicare Other | Attending: Emergency Medicine | Admitting: Emergency Medicine

## 2022-06-07 ENCOUNTER — Other Ambulatory Visit: Payer: Self-pay

## 2022-06-07 ENCOUNTER — Emergency Department (HOSPITAL_COMMUNITY): Payer: Medicare Other

## 2022-06-07 DIAGNOSIS — R0602 Shortness of breath: Secondary | ICD-10-CM | POA: Diagnosis not present

## 2022-06-07 DIAGNOSIS — K921 Melena: Secondary | ICD-10-CM | POA: Diagnosis present

## 2022-06-07 DIAGNOSIS — R1011 Right upper quadrant pain: Secondary | ICD-10-CM | POA: Diagnosis not present

## 2022-06-07 DIAGNOSIS — K529 Noninfective gastroenteritis and colitis, unspecified: Secondary | ICD-10-CM | POA: Diagnosis not present

## 2022-06-07 DIAGNOSIS — R197 Diarrhea, unspecified: Secondary | ICD-10-CM | POA: Diagnosis not present

## 2022-06-07 DIAGNOSIS — R079 Chest pain, unspecified: Secondary | ICD-10-CM | POA: Diagnosis not present

## 2022-06-07 LAB — CBC WITH DIFFERENTIAL/PLATELET
Abs Immature Granulocytes: 0.01 10*3/uL (ref 0.00–0.07)
Basophils Absolute: 0 10*3/uL (ref 0.0–0.1)
Basophils Relative: 1 %
Eosinophils Absolute: 0.1 10*3/uL (ref 0.0–0.5)
Eosinophils Relative: 2 %
HCT: 39.1 % (ref 39.0–52.0)
Hemoglobin: 12.9 g/dL — ABNORMAL LOW (ref 13.0–17.0)
Immature Granulocytes: 0 %
Lymphocytes Relative: 30 %
Lymphs Abs: 1.3 10*3/uL (ref 0.7–4.0)
MCH: 29.1 pg (ref 26.0–34.0)
MCHC: 33 g/dL (ref 30.0–36.0)
MCV: 88.3 fL (ref 80.0–100.0)
Monocytes Absolute: 0.5 10*3/uL (ref 0.1–1.0)
Monocytes Relative: 11 %
Neutro Abs: 2.5 10*3/uL (ref 1.7–7.7)
Neutrophils Relative %: 56 %
Platelets: 120 10*3/uL — ABNORMAL LOW (ref 150–400)
RBC: 4.43 MIL/uL (ref 4.22–5.81)
RDW: 13.4 % (ref 11.5–15.5)
WBC: 4.5 10*3/uL (ref 4.0–10.5)
nRBC: 0 % (ref 0.0–0.2)

## 2022-06-07 LAB — COMPREHENSIVE METABOLIC PANEL
ALT: 30 U/L (ref 0–44)
AST: 32 U/L (ref 15–41)
Albumin: 3 g/dL — ABNORMAL LOW (ref 3.5–5.0)
Alkaline Phosphatase: 49 U/L (ref 38–126)
Anion gap: 8 (ref 5–15)
BUN: 11 mg/dL (ref 8–23)
CO2: 23 mmol/L (ref 22–32)
Calcium: 8.1 mg/dL — ABNORMAL LOW (ref 8.9–10.3)
Chloride: 106 mmol/L (ref 98–111)
Creatinine, Ser: 0.88 mg/dL (ref 0.61–1.24)
GFR, Estimated: >60 mL/min (ref >60)
Glucose, Bld: 254 mg/dL — ABNORMAL HIGH (ref 70–99)
Potassium: 3.8 mmol/L (ref 3.5–5.1)
Sodium: 137 mmol/L (ref 135–145)
Total Bilirubin: 0.6 mg/dL (ref 0.3–1.2)
Total Protein: 5.5 g/dL — ABNORMAL LOW (ref 6.5–8.1)

## 2022-06-07 LAB — TYPE AND SCREEN
ABO/RH(D): O POS
Antibody Screen: NEGATIVE

## 2022-06-07 MED ORDER — BENZONATATE 100 MG PO CAPS: 100.0000 mg | ORAL_CAPSULE | Freq: Three times a day (TID) | ORAL | 1 refills | Status: DC | PRN

## 2022-06-07 MED ORDER — PANTOPRAZOLE SODIUM 40 MG IV SOLR
40.0000 mg | Freq: Once | INTRAVENOUS | Status: AC
  Administered 2022-06-07: 40 mg via INTRAVENOUS
  Filled 2022-06-07: qty 10

## 2022-06-07 MED ORDER — SODIUM CHLORIDE 0.9 % IV BOLUS
1000.0000 mL | Freq: Once | INTRAVENOUS | Status: AC
  Administered 2022-06-07: 1000 mL via INTRAVENOUS

## 2022-06-07 NOTE — ED Provider Notes (Signed)
[facility] Provider Note   CSN: [id number] Arrival date & time: 06/07/22  1010     History {Add pertinent medical, surgical, social history, OB history to HPI:1} Chief Complaint  Patient presents with   Melena    Lance [name] is a 77 y.o. male.  Patient had some dark diarrhea.  And persistent cough   Diarrhea      Home Medications Prior to Admission medications   Medication Sig Start Date End Date Taking? Authorizing Provider  benzonatate (TESSALON) 100 MG capsule Take 1 capsule (100 mg total) by mouth 3 (three) times daily as needed for cough. 06/07/22  Yes Milton Ferguson, MD  diphenoxylate-atropine (LOMOTIL) 2.5-0.025 MG tablet 1 every 6 hours as needed diarrhea 06/04/22   Luking, Elayne Snare, MD  albuterol (VENTOLIN HFA) 108 (90 Base) MCG/ACT inhaler Inhale 2 puffs into the lungs every 6 (six) hours as needed for wheezing or shortness of breath. 02/11/22   Ameduite, Trenton Gammon, FNP  alfuzosin (UROXATRAL) 10 MG 24 hr tablet Take 1 tablet (10 mg total) by mouth daily. 12/23/21   Stoneking, Reece Leader., MD  ALPRAZolam Duanne Moron) 0.5 MG tablet TAKE ONE TABLET BY MOUTH TWICE A DAY 05/06/22   [name], MD  aspirin EC 81 MG tablet Take 81 mg by mouth daily.    [provider]  Cholecalciferol 125 MCG (5000 UT) TABS Take 1 tablet by mouth daily.    [provider]  clopidogrel (PLAVIX) 75 MG tablet TAKE ONE (1) TABLET BY MOUTH EVERY DAY 05/25/22   [name], MD  Coenzyme Q10-Vitamin E (QUNOL ULTRA COQ10 PO) Take 1 tablet by mouth daily.    [provider]  donepezil (ARICEPT) 10 MG tablet TAKE ONE TABLET BY MOUTH EVERY NIGHT AT BEDTIME 08/20/21   [name], MD  ezetimibe (ZETIA) 10 MG tablet Take 1 tablet (10 mg total) by mouth daily. 12/23/21 12/18/22  Arnoldo Lenis, MD  FARXIGA 10 MG TABS tablet Take 10 mg by mouth daily. 07/02/21   [provider]  fexofenadine (ALLEGRA) 180 MG tablet Take 180 mg  by mouth daily.    [provider]  HUMALOG KWIKPEN 100 UNIT/ML KiwkPen Inject 0.32-0.38 mLs (32-38 Units total) into the skin 3 (three) times daily. Patient taking differently: Inject 25 Units into the skin 3 (three) times daily before meals. 07/04/15   Cassandria Anger, MD  HYDROcodone-acetaminophen (NORCO) 7.5-325 MG tablet Take 0.5-1 tablets by mouth every 12 (twelve) hours as needed for moderate pain.    [provider]  Insulin Detemir (LEVEMIR FLEXTOUCH) 100 UNIT/ML Pen Inject 90 Units into the skin daily at 10 pm. Patient taking differently: Inject 100 Units into the skin at bedtime. 07/11/15   Cassandria Anger, MD  Insulin Pen Needle (B-D ULTRAFINE III SHORT PEN) 31G X 8 MM MISC USE 1 NEEDLE 4 TIMES DAILY AS DIRECTED 09/13/18   [provider]  losartan (COZAAR) 25 MG tablet Take 25 mg by mouth daily.    [provider]  memantine (NAMENDA) 5 MG tablet Take 1 tablet (5 mg total) by mouth 2 (two) times daily. 03/30/22   [name], MD  metoprolol tartrate (LOPRESSOR) 25 MG tablet Take 0.5 tablets (12.5 mg total) by mouth 2 (two) times daily. 12/23/21   Arnoldo Lenis, MD  montelukast (SINGULAIR) 10 MG tablet TAKE 1 TABLET BY MOUTH AT  BEDTIME 12/14/21   [name], MD  [facility]  25 MG TB24 tablet TAKE ONE TABLET ('25MG'$  TOTAL) BY MOUTH DAILY 03/31/22   Stoneking, Reece Leader., MD  nitroGLYCERIN (NITROSTAT) 0.4 MG SL tablet PLACE ONE TABLET (0.'4MG'$  TOTAL) UNDER THETONGUE EVERY FIVE MINUTES AS NEEDED FOR CHEST PAIN 08/19/21   [name], Fransisco Hertz, PA-C  ONETOUCH ULTRA test strip USE 1 STRIP TO CHECK GLUCOSE 4 TIMES DAILY 11/14/18   [provider]  Probiotic Product (ALIGN PO) Take 1 tablet by mouth daily.     [provider]  rosuvastatin (CRESTOR) 40 MG tablet TAKE ONE (1) TABLET BY MOUTH EVERY DAY 09/16/21   Arnoldo Lenis, MD  sertraline (ZOLOFT) 50 MG tablet TAKE ONE (1) TABLET BY MOUTH EVERY DAY 04/01/22   [name],  MD  tadalafil (CIALIS) 20 MG tablet Take 1 tablet (20 mg total) by mouth daily as needed for erectile dysfunction. 06/25/21   Stoneking, Reece Leader., MD  Tiotropium Bromide-Olodaterol (STIOLTO RESPIMAT) 2.5-2.5 MCG/ACT AERS Inhale 2 puffs into the lungs daily. 05/11/22   [name], MD  vitamin B-12 (CYANOCOBALAMIN) 1000 MCG tablet Take 6,000 mcg by mouth daily.    [provider]      Allergies    Morphine and related, Penicillins, Percocet [oxycodone-acetaminophen], Latex, Levaquin [levofloxacin in d5w], Metformin and related, and Tape    Review of Systems   Review of Systems  Gastrointestinal:  Positive for diarrhea.    Physical Exam Updated Vital Signs BP 122/82 (BP Location: Right Arm)   Pulse 69   Temp 97.8 F (36.6 C) (Oral)   Resp 18   Wt 98.1 kg   SpO2 97%   BMI 31.93 kg/m  Physical Exam  ED Results / Procedures / Treatments   Labs (all labs ordered are listed, but only abnormal results are displayed) Labs Reviewed  CBC WITH DIFFERENTIAL/PLATELET - Abnormal; Notable for the following components:      Result Value   Hemoglobin 12.9 (*)    Platelets 120 (*)    All other components within normal limits  COMPREHENSIVE METABOLIC PANEL - Abnormal; Notable for the following components:   Glucose, Bld 254 (*)    Calcium 8.1 (*)    Total Protein 5.5 (*)    Albumin 3.0 (*)    All other components within normal limits  POC OCCULT BLOOD, ED - Normal  TYPE AND SCREEN    EKG None  Radiology DG Chest Port 1 View  Result Date: 06/07/2022 CLINICAL DATA:  Shortness of breath with diarrhea for 3 days, also having RIGHT upper quadrant pain. EXAM: PORTABLE CHEST 1 VIEW COMPARISON:  February 01, 2022 FINDINGS: Post sternotomy with signs of dual lead pacer defibrillator in place. EKG leads project over the chest. Cardiomediastinal contours are stable accounting for AP projection and slightly diminished lung volumes. No lobar level consolidative process.  No pneumothorax.  On limited assessment no acute skeletal process. IMPRESSION: Post sternotomy with signs of dual lead pacer defibrillator in place. No acute cardiopulmonary disease. Electronically Signed   By: Zetta Bills M.D.   On: 06/07/2022 15:30    Procedures Procedures  {Document cardiac monitor, telemetry assessment procedure when appropriate:1}  Medications Ordered in ED Medications  sodium chloride 0.9 % bolus 1,000 mL (0 mLs Intravenous Stopped 06/07/22 1305)  pantoprazole (PROTONIX) injection 40 mg (40 mg Intravenous Given 06/07/22 1058)    ED Course/ Medical Decision Making/ A&P   {   Click here for ABCD2, HEART and other calculatorsREFRESH Note before signing :1}  Medical Decision Making Amount and/or Complexity of Data Reviewed Labs: ordered. Radiology: ordered.  Risk Prescription drug management.   Patient with gastroenteritis and cough.  No obvious GI bleed.  Patient is given some Tessalon Perles will follow-up PCP  {Document critical care time when appropriate:1} {Document review of labs and clinical decision tools ie heart score, Chads2Vasc2 etc:1}  {Document your independent review of radiology images, and any outside records:1} {Document your discussion with family members, caretakers, and with consultants:1} {Document social determinants of health affecting pt's care:1} {Document your decision making why or why not admission, treatments were needed:1} Final Clinical Impression(s) / ED Diagnoses Final diagnoses:  Gastroenteritis    Rx / DC Orders ED Discharge Orders          Ordered    benzonatate (TESSALON) 100 MG capsule  3 times daily PRN        06/07/22 1537

## 2022-06-07 NOTE — ED Triage Notes (Signed)
Pt in with c/o diarrhea x 3 days, also having RUQ pain. States he saw his family doc, and he advised him to take Imodium. Pt states diarrhea has improved, but he's now noticing black stools since Saturday. C/o some weakness, states he takes Plavix

## 2022-06-07 NOTE — ED Notes (Signed)
Signature not taking on computer for dispo

## 2022-06-07 NOTE — ED Notes (Signed)
Hemoccult negative at bedside per Dr. Roderic Palau

## 2022-06-07 NOTE — Discharge Instructions (Signed)
Follow-up with your doctor next week for recheck 

## 2022-06-07 NOTE — ED Notes (Signed)
Patient assisted to bathroom 

## 2022-06-14 ENCOUNTER — Ambulatory Visit (INDEPENDENT_AMBULATORY_CARE_PROVIDER_SITE_OTHER): Payer: Medicare Other | Admitting: Family Medicine

## 2022-06-14 ENCOUNTER — Encounter: Payer: Self-pay | Admitting: Family Medicine

## 2022-06-14 VITALS — BP 104/68 | HR 86 | Wt 215.2 lb

## 2022-06-14 DIAGNOSIS — I77819 Aortic ectasia, unspecified site: Secondary | ICD-10-CM

## 2022-06-14 DIAGNOSIS — E1159 Type 2 diabetes mellitus with other circulatory complications: Secondary | ICD-10-CM

## 2022-06-14 DIAGNOSIS — I25119 Atherosclerotic heart disease of native coronary artery with unspecified angina pectoris: Secondary | ICD-10-CM | POA: Diagnosis not present

## 2022-06-14 DIAGNOSIS — I509 Heart failure, unspecified: Secondary | ICD-10-CM

## 2022-06-14 DIAGNOSIS — R197 Diarrhea, unspecified: Secondary | ICD-10-CM

## 2022-06-14 DIAGNOSIS — E113512 Type 2 diabetes mellitus with proliferative diabetic retinopathy with macular edema, left eye: Secondary | ICD-10-CM

## 2022-06-14 DIAGNOSIS — J439 Emphysema, unspecified: Secondary | ICD-10-CM | POA: Diagnosis not present

## 2022-06-14 NOTE — Progress Notes (Signed)
   Subjective:    Patient ID: Lance [name], male    DOB: 1945/09/04, 77 y.o.   MRN: [id number]  HPI Patient arrives for ER follow up for dark stool. Patient states he is still having weakness. 104 Patient recently went to the ER because of frequent diarrhea issues.  Denied any blood.  States her bowel movements couple time were very dark but he had had some Pepto-Bismol couple days prior Patient did take a stool sample to the ER but they threw it away He states the lower abdomen is starting to feel better.  Denies any severe abdominal pains.  Denies sweats and chills dysuria.  Review of Systems     Objective:   Physical Exam Lungs are clear hearts regular pulse normal abdomen is soft  Diabetic foot exam has a blister on the right toe also has diabetic neuropathy both feet     Assessment & Plan:  1. Pulmonary emphysema, unspecified emphysema type ([place]) Followed by pulmonologist patient had stopped his specialized inhaler I told him to start rinsing his mouth after use and that should help with tolerability he will try utilizing it again and follow-up with Lance [name]  2. Ectatic aorta [facility]) Noted on CAT scan in 2018 follow-up CAT scan looked good in 2023  3. Congestive heart failure, unspecified HF chronicity, unspecified heart failure type ([place]) Followed by cardiology on medications stable currently  4. Type 2 diabetes mellitus with vascular disease ([place]) Followed by endocrinology on a regular basis he is working hard at trying to get glucose under better control  5. Proliferative diabetic retinopathy of left eye with macular edema associated with type 2 diabetes mellitus ([place]) Followed by ophthalmology, the stable patient avoids driving when possible but he considers himself safe to drive  6. Coronary artery disease involving native heart with angina pectoris, unspecified vessel or lesion type ([place]) Followed by cardiology stable currently no shortness of breath  7. Diarrhea,  unspecified type If he has recurrent loose stools we will do C. difficile testing this seems to be checking currently  Has follow-up in late March she has a specialized scan of his brain coming up we will wait and see what that shows evaluated for possible parkinsonism by neurology

## 2022-06-15 ENCOUNTER — Telehealth: Payer: Self-pay

## 2022-06-15 NOTE — Telephone Encounter (Signed)
     Patient  visit on 2/5  at [facility] you been able to follow up with your primary care physician? Yes   The patient was or was not able to obtain any needed medicine or equipment. Yes   Are there diet recommendations that you are having difficulty following? Na   Patient expresses understanding of discharge instructions and education provided has no other needs at this time.  Yes      [facility] [phone] [address], [place], [place] [address] Phone: [phone] Email: [email or web address]

## 2022-06-16 ENCOUNTER — Encounter (HOSPITAL_COMMUNITY): Payer: Self-pay | Admitting: Internal Medicine

## 2022-06-17 ENCOUNTER — Ambulatory Visit: Payer: Medicare Other | Admitting: Internal Medicine

## 2022-06-17 ENCOUNTER — Ambulatory Visit (INDEPENDENT_AMBULATORY_CARE_PROVIDER_SITE_OTHER): Payer: Medicare Other

## 2022-06-17 ENCOUNTER — Encounter: Payer: Self-pay | Admitting: Internal Medicine

## 2022-06-17 VITALS — BP 138/82 | HR 82 | Ht 69.0 in | Wt 215.6 lb

## 2022-06-17 DIAGNOSIS — R0789 Other chest pain: Secondary | ICD-10-CM | POA: Diagnosis not present

## 2022-06-17 DIAGNOSIS — R0609 Other forms of dyspnea: Secondary | ICD-10-CM

## 2022-06-17 DIAGNOSIS — R058 Other specified cough: Secondary | ICD-10-CM | POA: Diagnosis not present

## 2022-06-17 DIAGNOSIS — I255 Ischemic cardiomyopathy: Secondary | ICD-10-CM

## 2022-06-17 LAB — CUP PACEART REMOTE DEVICE CHECK
Battery Remaining Longevity: 90 mo
Battery Remaining Percentage: 91 %
Battery Voltage: 3.14 V
Brady Statistic AP VP Percent: 1 %
Brady Statistic AP VS Percent: 1 %
Brady Statistic AS VP Percent: 1 %
Brady Statistic AS VS Percent: 99 %
Brady Statistic RA Percent Paced: 1 %
Brady Statistic RV Percent Paced: 1 %
Date Time Interrogation Session: 20240214062531
HighPow Impedance: 56 Ohm
HighPow Impedance: 56 Ohm
Implantable Lead Connection Status: 753985
Implantable Lead Connection Status: 753985
Implantable Lead Implant Date: 20050706
Implantable Lead Implant Date: 20161128
Implantable Lead Location: 753859
Implantable Lead Location: 753860
Implantable Lead Model: 7122
Implantable Pulse Generator Implant Date: 20230518
Lead Channel Impedance Value: 360 Ohm
Lead Channel Impedance Value: 400 Ohm
Lead Channel Pacing Threshold Amplitude: 0.75 V
Lead Channel Pacing Threshold Amplitude: 1 V
Lead Channel Pacing Threshold Pulse Width: 0.5 ms
Lead Channel Pacing Threshold Pulse Width: 0.5 ms
Lead Channel Sensing Intrinsic Amplitude: 3.1 mV
Lead Channel Sensing Intrinsic Amplitude: 9.4 mV
Lead Channel Setting Pacing Amplitude: 2 V
Lead Channel Setting Pacing Amplitude: 2.5 V
Lead Channel Setting Pacing Pulse Width: 0.5 ms
Lead Channel Setting Sensing Sensitivity: 0.5 mV
Pulse Gen Serial Number: 8942717

## 2022-06-17 MED ORDER — PANTOPRAZOLE SODIUM 40 MG PO TBEC: DELAYED_RELEASE_TABLET | ORAL | Status: DC

## 2022-06-17 MED ORDER — METHYLPREDNISOLONE ACETATE 80 MG/ML IJ SUSP
120.0000 mg | Freq: Once | INTRAMUSCULAR | Status: AC
  Administered 2022-06-17: 120 mg via INTRAMUSCULAR

## 2022-06-17 NOTE — Assessment & Plan Note (Signed)
Onset summer 2023 - trial of rx as IBS 06/02/2022 >>> not done as of 06/17/2022 and still same pattern and no better off ppi > repeat instructions/ citrucel 1 tsp bid

## 2022-06-17 NOTE — Assessment & Plan Note (Addendum)
MBS/DgEs -  CT Sinus (part of head ct) 06/28/18  [facility]  - 10/03/2018 reported at pulmonary eval continuous use of daytime peppermint > rec d/c  - Allergy profile 10/03/2018 >  Eos 0.1/  IgE  104  RAST pos to everything but mold   If not response to above rec ENT re-eval, this time at [facility]/ Dr Bettina Gavia  Each maintenance medication was reviewed in detail including emphasizing most importantly the difference between maintenance and prns and under what circumstances the prns are to be triggered using an action plan format where appropriate.  Total time for H and P, chart review, counseling,  directly observing portions of ambulatory 02 saturation study/ and generating customized AVS unique to this office visit / same day charting =  40 min for multiple  refractory respiratory /chest  symptoms of uncertain etiology

## 2022-06-17 NOTE — Assessment & Plan Note (Signed)
Onset 2015 Echo 08/2013:  EF 45%, mod RV dysfxn, normal PA pressures, moderate LAE CXR 03/2014:  No acute process PFTs 2013:  FEV1 2.96 (88%), ratio 76, TLC 6.75 (99%), DLCO 28.59 (92%) PFTs 03/2014:  FEV1 2.82 (86%), ratio 83, TLC 5.39 (79%), DLCO 22.89 (73%) - 10/03/2018   Walked RA  2 laps @  approx 260f each @ nl pace  stopped due to  End of study, no sob or cough or desats   10/23/2018 reported "as long as walk in straight line, no sob at all, just has it if turns head assoc with dizziness  - PFT's 12/08/2018  FEV1 3.10 (99), ratio 0.81, no BD response, normal DLCO, TLC 5.04 (73%), mild curvature on flow volume loop - Trial Spiriva respimat 2.5 >no change 01/09/2019 so d/c'd permanently  - 01/09/2019   Walked RA  2 laps @  approx 2520feach @ mod to fast pace  stopped due to  End of study, sob with sats still 99%  > pulmonary f/u prn - 06/02/2022  After extensive coaching inhaler device,  effectiveness =   75% with smi > continue stioto 2 each am  - 06/02/2022   Walked on RA  x 2  lap(s) =  approx 300  ft  @ mod pace, stopped due to sob and dizzy  with lowest 02 sats 95% - 06/17/2022   Walked on RA  x   2 lap(s) =  approx 300  ft  @ mod  pace, stopped due to tired and dizzy > sob  with lowest 02 sats 96%   Main features are deconditioning and pseudowheezing, nothing to suggest airlfow obst or response to stiolto which may be contributing to his now predominant upper airway symptoms so rec  1) d/c stiolto and just use saba more approp Re SABA :  I spent extra time with pt today reviewing appropriate use of albuterol for prn use on exertion with the following points: 1) saba is for relief of sob that does not improve by walking a slower pace or resting but rather if the pt does not improve after trying this first. 2) If the pt is convinced, as many are, that saba helps recover from activity faster then it's easy to tell if this is the case by re-challenging : ie stop, take the inhaler, then p 5 minutes try  the exact same activity (intensity of workload) that just caused the symptoms and see if they are substantially diminished or not after saba 3) if there is an activity that reproducibly causes the symptoms, try the saba 15 min before the activity on alternate days   If in fact the saba really does help, then fine to continue to use it prn but advised may need to look closer at the maintenance regimen being used to achieve better control of airways disease with exertion.  2) resume max gerd rx with ppi ac and  H2 after supper plus diet/ HOB elevation x increase to 8 in bed blocks   3) Depomedrol 120 mg IM  also  per day in case of component of Th-2 driven upper or lower airways inflammation (if symptoms respond short term only to relapse before return while will on full rx as above then  that would point to allergic rhinitis/ asthma or eos bronchitis as alternative dx possibilities.

## 2022-06-17 NOTE — Patient Instructions (Addendum)
Also  Ok to try albuterol 15 min before an activity (on alternating days)  that you know would usually make you short of breath and see if it makes any difference and if makes none then don't take albuterol after activity unless you can't catch your breath as this means it's the resting that helps, not the albuterol.  Depomedrol 120 mg IM today   Classic subdiaphragmatic pain pattern suggests ibs:  Stereotypical, migratory with a very limited distribution of pain locations, daytime, not usually exacerbated by exercise  or coughing, worse in sitting position, frequently associated with generalized abd bloating, not as likely to be present supine due to the dome effect of the diaphragm which  is  canceled in that position. Frequently these patients have had multiple negative GI workups and CT scans.  Treatment consists of avoiding foods that cause gas (especially boiled eggs, mexcican food but especially  beans and undercooked vegetables like  spinach and some salads)  and citrucel 1 heaping tsp twice daily with a large glass of water.  Pain should improve w/in 2 weeks and if not then consider further GI work up.     Leave off stiolto for now   Pantoprazole (protonix) 40 mg   Take  30-60 min before first meal of the day and Pepcid (famotidine)  20 mg after supper until return to office - this is the best way to tell whether stomach acid is contributing to your problem.    Please schedule a follow up office visit in 4 weeks, sooner if needed  with all medications /inhalers/ solutions in hand so we can verify exactly what you are taking. This includes all medications from all doctors and over the counters

## 2022-06-17 NOTE — Progress Notes (Signed)
Lance [name], male    DOB: [birth year]    MRN: [id number]   Brief patient profile:  6  yowm quit smoking 2000 due to heart with nl pfts as of 03/21/14 referred to pulmonary clinic 10/03/2018 by Lance   Lance [name] for cough x around 2013 (? p 2 neck surgeries)  never resolved (w/u by Lance [name] [id number] neg )  then doe 2015 and already had cards eval with last [place] 07/2018:  ostial LAD occluded, LCX occluded, RCA mid 75%. RPDA 90% chronic. LIMA-LAD patent, SVG-OM patent, SVG-diag patent,  SVG-RPDA occluded. The RCA was ballooned. LVEDP 14 so referred to pulmonary.  Note neg w/u for lung dz by Lance [name] in 2016 and no response to spiriva so d/c'd    History of Present Illness  10/03/2018  Pulmonary/ 1st [name] eval/Lance [name]  Chief Complaint  Patient presents with   Shortness of Breath    Also feels like he is choking when eating. Will get tired very easily, has been seen by cardiology.   Dyspnea:  100 ft / slow = MMRC3 = can't walk 100 yards even at a slow pace at a flat grade s stopping due to sob  s variability Cough: dry hack 24/7 / globus sensations with dysphagia  / does not exac in am  Sleep: flat bed/ one pillow / some choking / smothering at hs / SABA use: no better and worse cough with  symbicort  so not using,  even neb does not help Uses allegra daily, not clearly helping "keep a mint in my mouth all the time" and seems to help throat "feel better" but not the cough rec Stop all mints and all inhalers Increase Protonix 40 mg Take 30- 60 min before your first and last meals of the day  GERD diet   10/23/2018  f/u ov/Lance [name] re: sob Chief Complaint  Patient presents with   Follow-up    Breathing has not improved. He stopped using rescue inhaler and neb b/c they were not helping.   Dyspnea:  Straight line no trouble with doe, some problem with steps so = MMRC1 = can walk nl pace, flat grade, can't hurry or go uphills or steps s sob   Cough: globus sensation, not really coughing  Sleeping: 2-3 nights per  weeks wakes up with choking / smothering and gets up and walks around and back to sleep ok SABA use: not helping  02: none  rec Ok stop all inhalers and nebulizers at this point as you don't feel they are helping We will be referring you back to Lance [name] for your sensation of something stuck in your throat     Lance [name] 12/08/2018 with pfts nl x minimal concavity [facility]  rec  Trial Spiriva - take 2 puffs once daily (sample given) Albuterol rescue inahler- take 2 puffs every 6 hours as needed only for shortness of breath    01/09/2019  f/u ov/Lance [name] re: sob no better on spiriva  Chief Complaint  Patient presents with   Follow-up    PFT's done 12/08/18.  Breathing is unchanged.   Dyspnea:  Tired more than sob, no change with lama or saba / really limited by back and legs  Cough: sensation of globus better with gerd per ENT   Sleeping: on side 10-15 degrees hob with bed blocks SABA use: still using bid s benefit  02: none Rec Ideally protonix should be Take 30- 60 min before your first and last meals of the day  Stop spiriva  Keep the albuterol handy if needed for an attack of breathing  To get the most out of exercise, you need to be continuously aware that you are short of breath, but never out of breath, for 30 minutes daily. Pulmonary follow up is as needed     06/02/2022  Re-establish  ov/Lance [name]/Lance [name] re: doe/cp/cough  x years  Chief Complaint  Patient presents with   Consult    Ref for COPD SOB for a few years   Dyspnea:  MMRC3 = can't walk 100 yards even at a slow pace at a flat grade s stopping due to sob Onset around 6-8 months  R cp ant while eating / sitting never lying down and not exertional or pleuritic   Cough: worse p supper and hs  Sleeping: L side down helps / electric bed  SABA use: not really hampi 02: no oxygen  Covid status: vax x 2/ never infected  Rec Continue stioto 2 puffs each am but work hard on technique: Work on inhaler technique:   Stop  pantoprazole and start pepcid 20 mg after bfast and supper  GERDrx Chest  pain pattern suggests it is gas pain:   start citrucel 1 heaping tsp twice daily with a large glass of water.  Pain should improve w/in 2 week  Please schedule a follow up [name] visit in 2 weeks, call sooner if needed with all medications /inhalers/ solutions in hand    06/17/2022  f/u ov/Lance [name]/Lance [name] re: doe  maint on stiolto/pepcid bid / no citrucel  / did not bring meds as rec  Chief Complaint  Patient presents with   Follow-up    Same since last ov   Dyspnea:  maybe a little better on stiolto but ? Bothered throat, better when off  Cough: worse since virus with assoc diarrhea but not n or v or obvious sinus complaints/ off ppi became more hoarse but this was p d/c ppi , no change cp but never took citucel  Sleeping: electric bed x 4 min > night time cough and smothering  SABA use: sev times a day  02: none      No obvious day to day or daytime variability or assoc excess/ purulent sputum or mucus plugs or hemoptysis or cp or chest tightness, subjective wheeze or overt   hb symptoms.    Also denies any obvious fluctuation of symptoms with weather or environmental changes or other aggravating or alleviating factors except as outlined above   No unusual exposure hx or h/o childhood pna/ asthma or knowledge of premature birth.  Current Allergies, Complete Past Medical History, Past Surgical History, Family History, and Social History were reviewed in [employer] record.  ROS  The following are not active complaints unless bolded Hoarseness, sore throat, dysphagia x 2-3 years, dental problems, itching, sneezing,  nasal congestion or discharge of excess mucus or purulent secretions, ear ache,   fever, chills, sweats, unintended wt loss or wt gain, classically pleuritic or exertional cp,  orthopnea pnd or arm/hand swelling  or leg swelling, presyncope, palpitations, abdominal pain,  anorexia, nausea, vomiting, diarrhea  or change in bowel habits or change in bladder habits, change in stools or change in urine, dysuria, hematuria,  rash, arthralgias, visual complaints, headache, numbness, weakness or ataxia or problems with walking or coordination,  change in mood or  memory.        Current Meds  Medication Sig   albuterol (VENTOLIN HFA) 108 (90  Base) MCG/ACT inhaler Inhale 2 puffs into the lungs every 6 (six) hours as needed for wheezing or shortness of breath.   alfuzosin (UROXATRAL) 10 MG 24 hr tablet Take 1 tablet (10 mg total) by mouth daily.   ALPRAZolam (XANAX) 0.5 MG tablet TAKE ONE TABLET BY MOUTH TWICE A DAY   aspirin EC 81 MG tablet Take 81 mg by mouth daily.   benzonatate (TESSALON) 100 MG capsule Take 1 capsule (100 mg total) by mouth 3 (three) times daily as needed for cough.   Cholecalciferol 125 MCG (5000 UT) TABS Take 1 tablet by mouth daily.   clopidogrel (PLAVIX) 75 MG tablet TAKE ONE (1) TABLET BY MOUTH EVERY DAY   Coenzyme Q10-Vitamin E (QUNOL ULTRA COQ10 PO) Take 1 tablet by mouth daily.   diphenoxylate-atropine (LOMOTIL) 2.5-0.025 MG tablet 1 every 6 hours as needed diarrhea   donepezil (ARICEPT) 10 MG tablet TAKE ONE TABLET BY MOUTH EVERY NIGHT AT BEDTIME   ezetimibe (ZETIA) 10 MG tablet Take 1 tablet (10 mg total) by mouth daily.   FARXIGA 10 MG TABS tablet Take 10 mg by mouth daily.   fexofenadine (ALLEGRA) 180 MG tablet Take 180 mg by mouth daily.   HUMALOG KWIKPEN 100 UNIT/ML KiwkPen Inject 0.32-0.38 mLs (32-38 Units total) into the skin 3 (three) times daily. (Patient taking differently: Inject 25 Units into the skin 3 (three) times daily before meals.)   HYDROcodone-acetaminophen (NORCO) 7.5-325 MG tablet Take 0.5-1 tablets by mouth every 12 (twelve) hours as needed for moderate pain.   Insulin Detemir (LEVEMIR FLEXTOUCH) 100 UNIT/ML Pen Inject 90 Units into the skin daily at 10 pm. (Patient taking differently: Inject 100 Units into the skin at  bedtime.)   Insulin Pen Needle (B-D ULTRAFINE III SHORT PEN) 31G X 8 MM MISC USE 1 NEEDLE 4 TIMES DAILY AS DIRECTED   losartan (COZAAR) 25 MG tablet Take 25 mg by mouth daily.   memantine (NAMENDA) 5 MG tablet Take 1 tablet (5 mg total) by mouth 2 (two) times daily.   metoprolol tartrate (LOPRESSOR) 25 MG tablet Take 0.5 tablets (12.5 mg total) by mouth 2 (two) times daily.   montelukast (SINGULAIR) 10 MG tablet TAKE 1 TABLET BY MOUTH AT  BEDTIME   MYRBETRIQ 25 MG TB24 tablet TAKE ONE TABLET (25MG TOTAL) BY MOUTH DAILY   nitroGLYCERIN (NITROSTAT) 0.4 MG SL tablet PLACE ONE TABLET (0.4MG TOTAL) UNDER THETONGUE EVERY FIVE MINUTES AS NEEDED FOR CHEST PAIN   ONETOUCH ULTRA test strip USE 1 STRIP TO CHECK GLUCOSE 4 TIMES DAILY   Probiotic Product (ALIGN PO) Take 1 tablet by mouth daily.    rosuvastatin (CRESTOR) 40 MG tablet TAKE ONE (1) TABLET BY MOUTH EVERY DAY   sertraline (ZOLOFT) 50 MG tablet TAKE ONE (1) TABLET BY MOUTH EVERY DAY   tadalafil (CIALIS) 20 MG tablet Take 1 tablet (20 mg total) by mouth daily as needed for erectile dysfunction.   Tiotropium Bromide-Olodaterol (STIOLTO RESPIMAT) 2.5-2.5 MCG/ACT AERS Inhale 2 puffs into the lungs daily.   vitamin B-12 (CYANOCOBALAMIN) 1000 MCG tablet Take 6,000 mcg by mouth daily.                Past Medical History:  Diagnosis Date   AICD (automatic cardioverter/defibrillator) present 2005   [employer] [name] ICD, for SCD   Allergic rhinitis, cause unspecified    Anxiety    Arthritis    "all over" (09/15/2016)   ASCVD (arteriosclerotic cardiovascular disease)    70% mid left anterior descending lesion  on cath in 06/1995; left anterior desending DES placed in 8/03 and RCA stent in 9/03; captain 3/05 revealed 90% second marginal for which PCI was performed, 70% PDA and a total obstruction of the first diagonal and marginal; sudden cardiac death in [place] in November 09, 2003 for which automatic implantable cardiac defibrillator placed; negativ stress nuclear  10/07   Atrial fibrillation ([place])    Benign prostatic hypertrophy    C. difficile diarrhea    CAD (coronary artery disease) 1997   CHF (congestive heart failure) ([place])    Chronic lower back pain    COPD (chronic obstructive pulmonary disease) ([place])    Coronary artery disease    a. s/p CABG in 2013 with LIMA-LAD, SVG-D1, SVG-OM, and SVG-PDA b. DES to SVG-PDA in 2016 c. DES to mid-RCA in 08/2016   CVD (cerebrovascular disease) 05/2008   Transient ischemic attack; carotid ultrasound-plaque without focal disease   Degenerative joint disease 2002   C-spine fusion    Depression    Diabetes mellitus without mention of complication    Diabetic peripheral neuropathy ([place]) 09/06/2014   Erectile dysfunction    Esophageal reflux    Headache    "monthly" (09/15/2016)   HOH (hard of hearing)    Hx-TIA (transient ischemic attack) 2010   Hyperlipidemia    Hypertension    Memory deficits 09/05/2013   Myocardial infarction ([place]) 1995   Other testicular hypofunction    Pacemaker    S/P endoscopy Dec 2011   RMR: nl esophagus, hyperplastic polyp, active gastritis, no H.pylori.    Shortness of breath    Stroke [name]) 2014   denies residual on 09/15/2016   Tobacco abuse    100 pack/year comsuption; cigarettes discontinued 2003; all tobacco products in 2008   Tubular adenoma    Type II diabetes mellitus (HCC)    Insulin requirement   Vitreous floaters of left eye       Objective:   Wts  06/17/2022        215  06/02/2022        216   01/09/2019          221   10/23/18 216 lb (98 kg)  10/03/18 220 lb (99.8 kg)  08/18/18 222 lb (100.7 kg)    Vital signs reviewed  06/17/2022  - Note at rest 02 sats  95% on RA   General appearance:    depressed amb wm/ classic pseudowheeze     HEENT : Oropharynx  clear/ edentulous     Nasal turbinates nl/ no disharge or excess secretions    NECK :  without  apparent JVD/ palpable Nodes/TM    LUNGS: no acc muscle use,  Nl contour chest which is clear to A and P  bilaterally without cough on insp or exp maneuvers   CV:  RRR  no s3 or murmur or increase in P2, and no edema   ABD:  soft and nontender with nl inspiratory excursion in the supine position. No bruits or organomegaly appreciated   MS:  Nl gait/ ext warm without deformities Or obvious joint restrictions  calf tenderness, cyanosis or clubbing    SKIN: warm and dry without lesions    NEURO:  alert, approp, nl sensorium with  no motor or cerebellar deficits apparent.       Labs  reviewed:   Labs ordered/ reviewed:      Chemistry      Component Value Date/Time   NA 137 06/07/2022 1028   NA 135 06/02/2022  1626   K 3.8 06/07/2022 1028   CL 106 06/07/2022 1028   CO2 23 06/07/2022 1028   BUN 11 06/07/2022 1028   BUN 13 06/02/2022 1626   CREATININE 0.88 06/07/2022 1028   CREATININE 0.97 01/26/2019 0835      Component Value Date/Time   CALCIUM 8.1 (L) 06/07/2022 1028   ALKPHOS 49 06/07/2022 1028   AST 32 06/07/2022 1028   ALT 30 06/07/2022 1028   BILITOT 0.6 06/07/2022 1028   BILITOT 0.2 12/03/2021 1038        Lab Results  Component Value Date   WBC 4.5 06/07/2022   HGB 12.9 (L) 06/07/2022   HCT 39.1 06/07/2022   MCV 88.3 06/07/2022   PLT 120 (L) 06/07/2022    Lab Results  Component Value Date   PLT 120 (L) 06/07/2022   PLT 114 (L) 06/02/2022   PLT 147 (L) 12/03/2021   PLT 173 09/09/2021   PLT 148 (L) 07/21/2020   PLT 153 08/25/2015         I personally reviewed images and agree with radiology impression as follows:  CXR:   portable 06/07/22 Post sternotomy with signs of dual lead pacer defibrillator in place. No acute cardiopulmonary disease.      Assessment

## 2022-06-23 ENCOUNTER — Ambulatory Visit: Payer: Medicare Other | Attending: Nurse Practitioner | Admitting: Nurse Practitioner

## 2022-06-23 VITALS — BP 134/76 | HR 64 | Ht 69.0 in | Wt 212.6 lb

## 2022-06-23 DIAGNOSIS — Z8673 Personal history of transient ischemic attack (TIA), and cerebral infarction without residual deficits: Secondary | ICD-10-CM | POA: Diagnosis not present

## 2022-06-23 DIAGNOSIS — E785 Hyperlipidemia, unspecified: Secondary | ICD-10-CM | POA: Diagnosis not present

## 2022-06-23 DIAGNOSIS — I2510 Atherosclerotic heart disease of native coronary artery without angina pectoris: Secondary | ICD-10-CM

## 2022-06-23 DIAGNOSIS — R5383 Other fatigue: Secondary | ICD-10-CM | POA: Diagnosis not present

## 2022-06-23 DIAGNOSIS — I951 Orthostatic hypotension: Secondary | ICD-10-CM | POA: Diagnosis not present

## 2022-06-23 DIAGNOSIS — R0602 Shortness of breath: Secondary | ICD-10-CM

## 2022-06-23 DIAGNOSIS — I10 Essential (primary) hypertension: Secondary | ICD-10-CM | POA: Diagnosis not present

## 2022-06-23 DIAGNOSIS — Z8674 Personal history of sudden cardiac arrest: Secondary | ICD-10-CM

## 2022-06-23 DIAGNOSIS — I77819 Aortic ectasia, unspecified site: Secondary | ICD-10-CM

## 2022-06-23 DIAGNOSIS — Z95810 Presence of automatic (implantable) cardiac defibrillator: Secondary | ICD-10-CM

## 2022-06-23 DIAGNOSIS — R42 Dizziness and giddiness: Secondary | ICD-10-CM

## 2022-06-23 MED ORDER — METOPROLOL SUCCINATE ER 25 MG PO TB24: 25.0000 mg | ORAL_TABLET | Freq: Every day | ORAL | 3 refills | Status: DC

## 2022-06-23 NOTE — Progress Notes (Signed)
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 visit. No association with exertion. Will continue to monitor for now. Continue aspirin, Plavix, Zetia, losartan, Lopressor, Crestor, and nitroglycerin as needed.  Heart healthy diet recommended and increasing physical activity as tolerated encouraged. ED precautions discussed.   HTN  BP stable today.  No change in medication regimen.  Given BP log today and discussed to bring this to follow-up visit.  Discussed to monitor BP at home at least 2 hours after medications and sitting for 5-10 minutes. Heart healthy diet and regular cardiovascular exercise encouraged.   HLD Lipid panel from July 2023 revealed total cholesterol 157, triglycerides 95, LDL 98, and HDL 41.  Continue current medication regimen. Heart healthy diet and increasing physical activity as tolerated  encouraged.   Hx of sudden cardiac death, s/p ICD Followed by Dr. Lovena Le.  Recent remote device check revealed normal device function.  No changes were recommended.  Continue to follow-up with EP. Heart healthy diet and regular cardiovascular exercise as tolerated encouraged.   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 He is due for this to be checked again. No red flag signs of symptoms. Will order AAA ultrasound. Heart healthy diet recommended and increasing physical activity as tolerated encouraged.   Shortness of breath Has been ongoing, stable.  Attributes this to his fatigue and lethargy, see below. Recent Echo unremarkable. Recommended heart healthy diet and increasing physical activity as tolerated.  Continue to follow-up with pulmonology.  Fatigue, lethargy Etiology multifactorial, chronic. Recent Echo unremarkable. Most recent labs overall stable.  Stop bang score 5, continues to defers a sleep study at this time.  He is on quite a few medications that could be making him drowsy and fatigued, I recommended he follow-up with his PCP regarding this. Heart healthy diet and regular cardiovascular exercise as tolerated encouraged.   Orthostatic hypotension, dizziness Orthostatics positive for drop while going from supine to sitting, unable to complete d/t weakness. Discussed conservative measures, will switch Lopressor to Succinate, instructed to take at night to improve symptoms. Heart healthy diet recommended. Continue to f/u with Ortho and Neurologist. If no improvement by next visit, plan to start low dose midodrine if needed. Will obtain carotid dopplers.   Hx of TIA Denies any strokelike symptoms recently.  Continue aspirin and Plavix. Continue to follow with PCP and Neuro. Heart healthy diet and regular cardiovascular exercise as tolerated encouraged.     10. Disposition: Follow-up with me or APP in 6-8 weeks or sooner  if anything changes.  Medication Adjustments/Labs and Tests Ordered: Current medicines are reviewed at length with the patient today.  Concerns regarding medicines are outlined above.  Orders Placed This Encounter  Procedures   VAS US CAROTID   VAS [place] AAA DUPLEX   Meds ordered this encounter  Medications   metoprolol succinate (TOPROL XL) 25 MG 24 hr tablet    Sig: Take 1 tablet (25 mg total) by mouth daily.    Dispense:  90 tablet    Refill:  3    Patient Instructions  Medication Instructions:  STOP Metoprolol Tartrate  START Metoprolol Succinate (Toprol-XL) 25 mg- 1 tablet by mouth daily at night time   *If you need a refill on your cardiac medications before your next appointment, please call your pharmacy*  Lab Work: NONE ordered at this time of appointment   If you have labs (blood work) drawn today and your tests are completely normal, you will receive your results only by: [place] (if you have MyChart) OR A paper copy in the mail If you have any lab test that is abnormal or we need to change your treatment, we will call you to review the results.  Testing/Procedures: Your physician has requested that you have a carotid duplex. This test is an ultrasound of the carotid arteries in your neck. It looks at blood flow through these arteries that supply the brain with blood. Allow one hour for this exam. There are no restrictions or special instructions.  Your physician has requested that you have an abdominal aorta duplex. During this test, an ultrasound is used to evaluate the aorta. Allow 30 minutes for this exam. Do not eat after midnight the day before and avoid carbonated beverages  Follow-Up: At [facility], you and your health needs are our priority.  As part of our continuing mission to provide you with exceptional heart care, we have created designated Provider Care Teams.  These Care Teams include your primary Cardiologist (physician) and Advanced  Practice Providers (APPs -  Physician Assistants and Nurse Practitioners) who all work together to provide you with the care you need, when you need it.     Your next appointment:   6-8 week(s)  Provider:   Finis Bud, NP  Other Instructions      Signed, Lance Bud, NP  06/25/2022 8:39 PM    [place]

## 2022-06-23 NOTE — Patient Instructions (Addendum)
Medication Instructions:  STOP Metoprolol Tartrate  START Metoprolol Succinate (Toprol-XL) 25 mg- 1 tablet by mouth daily at night time   *If you need a refill on your cardiac medications before your next appointment, please call your pharmacy*  Lab Work: NONE ordered at this time of appointment   If you have labs (blood work) drawn today and your tests are completely normal, you will receive your results only by: [place] (if you have MyChart) OR A paper copy in the mail If you have any lab test that is abnormal or we need to change your treatment, we will call you to review the results.  Testing/Procedures: Your physician has requested that you have a carotid duplex. This test is an ultrasound of the carotid arteries in your neck. It looks at blood flow through these arteries that supply the brain with blood. Allow one hour for this exam. There are no restrictions or special instructions.  Your physician has requested that you have an abdominal aorta duplex. During this test, an ultrasound is used to evaluate the aorta. Allow 30 minutes for this exam. Do not eat after midnight the day before and avoid carbonated beverages  Follow-Up: At [facility], you and your health needs are our priority.  As part of our continuing mission to provide you with exceptional heart care, we have created designated Provider Care Teams.  These Care Teams include your primary Cardiologist (physician) and Advanced Practice Providers (APPs -  Physician Assistants and Nurse Practitioners) who all work together to provide you with the care you need, when you need it.     Your next appointment:   6-8 week(s)  Provider:   Finis Bud, NP  Other Instructions

## 2022-06-24 ENCOUNTER — Ambulatory Visit: Payer: Medicare Other | Admitting: Gastroenterology

## 2022-06-25 ENCOUNTER — Ambulatory Visit: Payer: Medicare Other | Admitting: Nurse Practitioner

## 2022-06-30 ENCOUNTER — Ambulatory Visit: Payer: Medicare Other | Admitting: Family Medicine

## 2022-07-05 ENCOUNTER — Other Ambulatory Visit: Payer: Self-pay | Admitting: Cardiology

## 2022-07-06 ENCOUNTER — Telehealth: Payer: Self-pay | Admitting: Cardiology

## 2022-07-06 MED ORDER — ROSUVASTATIN CALCIUM 40 MG PO TABS: ORAL_TABLET | ORAL | 1 refills | Status: DC

## 2022-07-06 NOTE — Telephone Encounter (Signed)
*  STAT* If patient is at the pharmacy, call can be transferred to refill team.   1. Which medications need to be refilled? (please list name of each medication and dose if known) rosuvastatin (CRESTOR) 40 MG tablet   2. Which pharmacy/location (including street and city if local pharmacy) is medication to be sent to?  [place]    3. Do they need a 30 day or 90 day supply? 90   Patient completely out of med

## 2022-07-06 NOTE — Telephone Encounter (Signed)
Done

## 2022-07-13 ENCOUNTER — Encounter (HOSPITAL_COMMUNITY)
Admission: RE | Admit: 2022-07-13 | Discharge: 2022-07-13 | Disposition: A | Discharge status: Home / Self Care | Payer: Medicare Other | Source: Ambulatory Visit | Attending: Diagnostic Neuroimaging | Admitting: Diagnostic Neuroimaging

## 2022-07-13 DIAGNOSIS — R251 Tremor, unspecified: Secondary | ICD-10-CM

## 2022-07-13 DIAGNOSIS — G20C Parkinsonism, unspecified: Secondary | ICD-10-CM | POA: Diagnosis not present

## 2022-07-13 MED ORDER — POTASSIUM IODIDE (ANTIDOTE) 130 MG PO TABS
ORAL_TABLET | ORAL | Status: AC
  Administered 2022-07-13: 130 mg via ORAL
  Filled 2022-07-13: qty 1

## 2022-07-13 MED ORDER — IOFLUPANE I 123 185 MBQ/2.5ML IV SOLN
4.2000 | Freq: Once | INTRAVENOUS | Status: AC | PRN
  Administered 2022-07-13: 4.2 via INTRAVENOUS
  Filled 2022-07-13: qty 5

## 2022-07-13 MED ORDER — POTASSIUM IODIDE (ANTIDOTE) 130 MG PO TABS: 130.0000 mg | ORAL_TABLET | Freq: Once | ORAL | Status: DC

## 2022-07-13 NOTE — Progress Notes (Signed)
Remote ICD transmission.   

## 2022-07-15 ENCOUNTER — Ambulatory Visit: Payer: Medicare Other | Attending: Nurse Practitioner

## 2022-07-15 ENCOUNTER — Ambulatory Visit (INDEPENDENT_AMBULATORY_CARE_PROVIDER_SITE_OTHER): Payer: Medicare Other

## 2022-07-15 DIAGNOSIS — I77819 Aortic ectasia, unspecified site: Secondary | ICD-10-CM

## 2022-07-15 DIAGNOSIS — Z8673 Personal history of transient ischemic attack (TIA), and cerebral infarction without residual deficits: Secondary | ICD-10-CM | POA: Diagnosis not present

## 2022-07-15 DIAGNOSIS — R42 Dizziness and giddiness: Secondary | ICD-10-CM | POA: Diagnosis not present

## 2022-07-19 NOTE — Progress Notes (Unsigned)
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 radiopharmaceutical. 4 hour post injection imaging. Appropriate positioning. 130 mg IO STAT given orally for thyroid blockade. RADIOPHARMACEUTICALS:  4.2 millicuries I 123 Ioflupane COMPARISON:  None Available. FINDINGS: The LEFT and RIGHT stratum are normal "comma" shape. There intense activity with the heads of the caudate nuclei and putamen. There is asymmetric activity between the striata with the LEFT basal ganglia more intense than the RIGHT. This is favored within normal limits. IMPRESSION: Ioflupane scan within normal limits. No reduced radiotracer activity in basal ganglia to suggest Parkinson's syndrome pathology. Of note, DaTSCAN is not diagnostic of Parkinsonian syndromes, which remains a clinical diagnosis. DaTscan is an adjuvant test to aid in the clinical  diagnosis of Parkinsonian syndromes. Electronically Signed   By: Genevive Bi M.D.   On: 07/13/2022 15:07    Assessment       PLAN   ***   Leanna Battles. Melvyn Neth, MHS, PA-C [facility]

## 2022-07-19 NOTE — H&P (View-Only) (Signed)
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 millicuries I 123 Ioflupane COMPARISON:  None Available. FINDINGS: The LEFT and RIGHT stratum are normal "comma" shape. There intense activity with the heads of the caudate nuclei and putamen. There is asymmetric activity between the striata with the LEFT basal ganglia more intense than the RIGHT. This is favored within normal limits. IMPRESSION: Ioflupane scan within normal limits. No reduced radiotracer activity in basal ganglia to suggest Parkinson's syndrome pathology. Of note, DaTSCAN is not diagnostic of Parkinsonian syndromes, which remains a clinical diagnosis. DaTscan is an adjuvant test to aid in the clinical  diagnosis of Parkinsonian syndromes. Electronically Signed   By: Stewart  Edmunds M.D.   On: 07/13/2022 15:07    Assessment       PLAN   ***   Berniece Abid S. Trejon Duford, MHS, PA-C [facility]  

## 2022-07-20 ENCOUNTER — Telehealth: Payer: Self-pay | Admitting: Gastroenterology

## 2022-07-20 ENCOUNTER — Ambulatory Visit: Payer: Medicare Other | Admitting: Gastroenterology

## 2022-07-20 ENCOUNTER — Encounter: Payer: Self-pay | Admitting: Gastroenterology

## 2022-07-20 VITALS — BP 135/79 | HR 79 | Temp 97.4°F | Ht 69.0 in | Wt 217.2 lb

## 2022-07-20 DIAGNOSIS — R1319 Other dysphagia: Secondary | ICD-10-CM | POA: Diagnosis not present

## 2022-07-20 DIAGNOSIS — R1011 Right upper quadrant pain: Secondary | ICD-10-CM | POA: Diagnosis not present

## 2022-07-20 DIAGNOSIS — R1310 Dysphagia, unspecified: Secondary | ICD-10-CM | POA: Insufficient documentation

## 2022-07-20 DIAGNOSIS — Z86010 Personal history of colonic polyps: Secondary | ICD-10-CM | POA: Diagnosis not present

## 2022-07-20 DIAGNOSIS — R197 Diarrhea, unspecified: Secondary | ICD-10-CM | POA: Diagnosis not present

## 2022-07-20 DIAGNOSIS — A09 Infectious gastroenteritis and colitis, unspecified: Secondary | ICD-10-CM

## 2022-07-20 DIAGNOSIS — K219 Gastro-esophageal reflux disease without esophagitis: Secondary | ICD-10-CM | POA: Diagnosis not present

## 2022-07-20 DIAGNOSIS — R140 Abdominal distension (gaseous): Secondary | ICD-10-CM

## 2022-07-20 NOTE — Patient Instructions (Signed)
Continue pantoprazole once daily. We will call to schedule upper endoscopy and colonoscopy.

## 2022-07-20 NOTE — Telephone Encounter (Addendum)
Patient seen in the office today.    Lance C. Please let pt know I want him to do stool studies with [facility]. Orders placed.  Lance R./[name]:  He needs to be scheduled for EGD/EGD/colonoscopy with Dr. Gala Romney.  ASA 4. Patient has a Programmer, multimedia Dx: dysphagia, ruq pain, diarrhea, h/o colon polyps Hold lomotil one week before. Hold farxiga 72 hours before. Day of prep: humalog 1/2 dose before meals, levemir 1/2 dose bedtime. Am of TCS: hold humalog and levemir

## 2022-07-21 NOTE — Telephone Encounter (Signed)
Pt was made aware and verbalized understanding.  

## 2022-07-21 NOTE — Telephone Encounter (Signed)
LMOVM to call back 

## 2022-07-22 DIAGNOSIS — M47892 Other spondylosis, cervical region: Secondary | ICD-10-CM | POA: Diagnosis not present

## 2022-07-22 DIAGNOSIS — A09 Infectious gastroenteritis and colitis, unspecified: Secondary | ICD-10-CM | POA: Diagnosis not present

## 2022-07-22 DIAGNOSIS — M542 Cervicalgia: Secondary | ICD-10-CM | POA: Diagnosis not present

## 2022-07-22 MED ORDER — PEG 3350-KCL-NA BICARB-NACL 420 G PO SOLR: 4000.0000 mL | Freq: Once | ORAL | 0 refills | Status: AC

## 2022-07-22 NOTE — Telephone Encounter (Signed)
Called pt and he is aware of his pre-op appt.

## 2022-07-22 NOTE — Addendum Note (Signed)
Addended by: Cheron Every on: 07/22/2022 10:07 AM   Modules accepted: Orders

## 2022-07-22 NOTE — Telephone Encounter (Signed)
Spoke with pt. He has been scheduled for 4/10 at 930am. Aware will inform of pre-op appt. Rx for prep sent to pharmacy. Instructions mailed. Also aware of medications to hold and when. He had no questions

## 2022-07-22 NOTE — Telephone Encounter (Signed)
Notification or Prior Authorization is not required for the requested services You are not required to submit a notification/prior authorization based on the information provided. The number above acknowledges your inquiry and our response. Please write this number down and refer to it for future inquiries. If you still wish to submit your request for review, please select the Continue with Submission button below. Decision ID #: [id number]

## 2022-07-23 ENCOUNTER — Other Ambulatory Visit: Payer: Self-pay | Admitting: Family Medicine

## 2022-07-24 LAB — GI PROFILE, STOOL, PCR

## 2022-07-24 LAB — CLOSTRIDIUM DIFFICILE EIA: C difficile Toxins A+B, EIA: NEGATIVE

## 2022-07-25 NOTE — Progress Notes (Unsigned)
Lance [name], male    DOB: [birth year]    MRN: [id number]   Brief patient profile:  6  yowm quit smoking 2000 due to heart with nl pfts as of 03/21/14 referred to pulmonary clinic 10/03/2018 by Lance   Lance [name] for cough x around 2013 (? p 2 neck surgeries)  never resolved (w/u by Lance [name] [id number] neg )  then doe 2015 and already had cards eval with last [place] 07/2018:  ostial LAD occluded, LCX occluded, RCA mid 75%. RPDA 90% chronic. LIMA-LAD patent, SVG-OM patent, SVG-diag patent,  SVG-RPDA occluded. The RCA was ballooned. LVEDP 14 so referred to pulmonary.  Note neg w/u for lung dz by Lance [name] in 2016 and no response to spiriva so d/c'd    History of Present Illness  10/03/2018  Pulmonary/ 1st [name] eval/Lance [name]  Chief Complaint  Patient presents with   Shortness of Breath    Also feels like he is choking when eating. Will get tired very easily, has been seen by cardiology.   Dyspnea:  100 ft / slow = MMRC3 = can't walk 100 yards even at a slow pace at a flat grade s stopping due to sob  s variability Cough: dry hack 24/7 / globus sensations with dysphagia  / does not exac in am  Sleep: flat bed/ one pillow / some choking / smothering at hs / SABA use: no better and worse cough with  symbicort  so not using,  even neb does not help Uses allegra daily, not clearly helping "keep a mint in my mouth all the time" and seems to help throat "feel better" but not the cough rec Stop all mints and all inhalers Increase Protonix 40 mg Take 30- 60 min before your first and last meals of the day  GERD diet   10/23/2018  f/u ov/Lance [name] re: sob Chief Complaint  Patient presents with   Follow-up    Breathing has not improved. He stopped using rescue inhaler and neb b/c they were not helping.   Dyspnea:  Straight line no trouble with doe, some problem with steps so = MMRC1 = can walk nl pace, flat grade, can't hurry or go uphills or steps s sob   Cough: globus sensation, not really coughing  Sleeping: 2-3 nights per  weeks wakes up with choking / smothering and gets up and walks around and back to sleep ok SABA use: not helping  02: none  rec Ok stop all inhalers and nebulizers at this point as you don't feel they are helping We will be referring you back to Lance [name] for your sensation of something stuck in your throat     Lance Napoleon NP 12/08/2018 with pfts nl x minimal concavity [facility]  rec  Trial Spiriva - take 2 puffs once daily (sample given) Albuterol rescue inahler- take 2 puffs every 6 hours as needed only for shortness of breath    01/09/2019  f/u ov/Ovella Lance [name] re: sob no better on spiriva  Chief Complaint  Patient presents with   Follow-up    PFT's done 12/08/18.  Breathing is unchanged.   Dyspnea:  Tired more than sob, no change with lama or saba / really limited by back and legs  Cough: sensation of globus better with gerd per ENT   Sleeping: on side 10-15 degrees hob with bed blocks SABA use: still using bid s benefit  02: none Rec Ideally protonix should be Take 30- 60 min before your first and last meals of the day  Stop spiriva  Keep the albuterol handy if needed for an attack of breathing  To get the most out of exercise, you need to be continuously aware that you are short of breath, but never out of breath, for 30 minutes daily. Pulmonary follow up is as needed     06/02/2022  Re-establish  ov/Lance [name]/Lance [name] re: doe/cp/cough  x years  Chief Complaint  Patient presents with   Consult    Ref for COPD SOB for a few years   Dyspnea:  MMRC3 = can't walk 100 yards even at a slow pace at a flat grade s stopping due to sob Onset around 6-8 months  R cp ant while eating / sitting never lying down and not exertional or pleuritic   Cough: worse p supper and hs  Sleeping: L side down helps / electric bed  SABA use: not really hampi 02: no oxygen  Covid status: vax x 2/ never infected  Rec Continue stioto 2 puffs each am but work hard on technique: Work on inhaler technique:   Stop  pantoprazole and start pepcid 20 mg after bfast and supper  GERDrx Chest  pain pattern suggests it is gas pain:   start citrucel 1 heaping tsp twice daily with a large glass of water.  Pain should improve w/in 2 week  Please schedule a follow up [name] visit in 2 weeks, call sooner if needed with all medications /inhalers/ solutions in hand    06/17/2022  f/u ov/Lance [name]/Lance [name] re: doe  maint on stiolto/pepcid bid / no citrucel  / did not bring meds as rec  Chief Complaint  Patient presents with   Follow-up    Same since last ov   Dyspnea:  maybe a little better on stiolto but ? Bothered throat, better when off  Cough: worse since virus with assoc diarrhea but not n or v or obvious sinus complaints/ off ppi became more hoarse but this was p d/c ppi , no change cp but never took citucel  Sleeping: electric bed x 4 min > night time cough and smothering  SABA use: sev times a day  02: none  Rec Also  Ok to try albuterol 15 min before an activity (on alternating days)  that you know would usually make you short of breath   Depomedrol 120 mg IM today  Classic subdiaphragmatic pain pattern suggests ibs:   Treatment consists of avoiding foods that cause gas (especially boiled eggs, mexcican food but especially  beans and undercooked vegetables like  spinach and some salads)  and citrucel 1 heaping tsp twice daily with a large glass of water.  Pain should improve w/in 2 weeks and if not then consider further GI work up.    Leave off stiolto for now  Pantoprazole (protonix) 40 mg   Take  30-60 min before first meal of the day and Pepcid (famotidine)  20 mg after supper until return to [name]  Please schedule a follow up [name] visit in 4 weeks, sooner if needed  with all medications /inhalers/ solutions in hand    07/26/2022  f/u ov/Lance [name]/Lance [name] re: *** maint on ***  No chief complaint on file.   Dyspnea:  *** Cough: *** Sleeping: *** SABA use: *** 02: *** Covid status:  *** Lung cancer screening: ***   No obvious day to day or daytime variability or assoc excess/ purulent sputum or mucus plugs or hemoptysis or cp or chest tightness, subjective wheeze or overt sinus or hb symptoms.   ***  without nocturnal  or early am exacerbation  of respiratory  c/o's or need for noct saba. Also denies any obvious fluctuation of symptoms with weather or environmental changes or other aggravating or alleviating factors except as outlined above   No unusual exposure hx or h/o childhood pna/ asthma or knowledge of premature birth.  Current Allergies, Complete Past Medical History, Past Surgical History, Family History, and Social History were reviewed in [employer] record.  ROS  The following are not active complaints unless bolded Hoarseness, sore throat, dysphagia, dental problems, itching, sneezing,  nasal congestion or discharge of excess mucus or purulent secretions, ear ache,   fever, chills, sweats, unintended wt loss or wt gain, classically pleuritic or exertional cp,  orthopnea pnd or arm/hand swelling  or leg swelling, presyncope, palpitations, abdominal pain, anorexia, nausea, vomiting, diarrhea  or change in bowel habits or change in bladder habits, change in stools or change in urine, dysuria, hematuria,  rash, arthralgias, visual complaints, headache, numbness, weakness or ataxia or problems with walking or coordination,  change in mood or  memory.        No outpatient medications have been marked as taking for the 07/26/22 encounter (Appointment) with Tanda Rockers, MD.                   Past Medical History:  Diagnosis Date   AICD (automatic cardioverter/defibrillator) present 2005   St [name] ICD, for SCD   Allergic rhinitis, cause unspecified    Anxiety    Arthritis    "all over" (09/15/2016)   ASCVD (arteriosclerotic cardiovascular disease)    70% mid left anterior descending lesion on cath in 06/1995; left anterior desending  DES placed in 8/03 and RCA stent in 9/03; captain 3/05 revealed 90% second marginal for which PCI was performed, 70% PDA and a total obstruction of the first diagonal and marginal; sudden cardiac death in [place] in 11-05-2003 for which automatic implantable cardiac defibrillator placed; negativ stress nuclear 10/07   Atrial fibrillation ([place])    Benign prostatic hypertrophy    C. difficile diarrhea    CAD (coronary artery disease) 1997   CHF (congestive heart failure) ([place])    Chronic lower back pain    COPD (chronic obstructive pulmonary disease) ([place])    Coronary artery disease    a. s/p CABG in 2013 with LIMA-LAD, SVG-D1, SVG-OM, and SVG-PDA b. DES to SVG-PDA in 2016 c. DES to mid-RCA in 08/2016   CVD (cerebrovascular disease) 05/2008   Transient ischemic attack; carotid ultrasound-plaque without focal disease   Degenerative joint disease 2002   C-spine fusion    Depression    Diabetes mellitus without mention of complication    Diabetic peripheral neuropathy ([place]) 09/06/2014   Erectile dysfunction    Esophageal reflux    Headache    "monthly" (09/15/2016)   HOH (hard of hearing)    Hx-TIA (transient ischemic attack) 2010   Hyperlipidemia    Hypertension    Memory deficits 09/05/2013   Myocardial infarction ([place]) 1995   Other testicular hypofunction    Pacemaker    S/P endoscopy Dec 2011   RMR: nl esophagus, hyperplastic polyp, active gastritis, no H.pylori.    Shortness of breath    Stroke [facility]) 2014   denies residual on 09/15/2016   Tobacco abuse    100 pack/year comsuption; cigarettes discontinued 2003; all tobacco products in 2008   Tubular adenoma    Type II diabetes mellitus (HCC)    Insulin  requirement   Vitreous floaters of left eye       Objective:   Wts  07/26/2022        ***  06/17/2022        215  06/02/2022        216   01/09/2019          221   10/23/18 216 lb (98 kg)  10/03/18 220 lb (99.8 kg)  08/18/18 222 lb (100.7 kg)     edentulous ***            Assessment

## 2022-07-26 ENCOUNTER — Ambulatory Visit: Payer: Medicare Other | Admitting: Internal Medicine

## 2022-07-26 ENCOUNTER — Encounter: Payer: Self-pay | Admitting: Internal Medicine

## 2022-07-26 VITALS — BP 104/61 | HR 69 | Ht 69.0 in | Wt 211.6 lb

## 2022-07-26 DIAGNOSIS — R0609 Other forms of dyspnea: Secondary | ICD-10-CM | POA: Diagnosis not present

## 2022-07-26 DIAGNOSIS — R058 Other specified cough: Secondary | ICD-10-CM | POA: Diagnosis not present

## 2022-07-26 MED ORDER — METHYLPREDNISOLONE ACETATE 80 MG/ML IJ SUSP
120.0000 mg | Freq: Once | INTRAMUSCULAR | Status: AC
  Administered 2022-07-26: 120 mg via INTRAMUSCULAR

## 2022-07-26 NOTE — Patient Instructions (Addendum)
For drainage / throat tickle try take CHLORPHENIRAMINE  4 mg  ("Allergy Relief" 4mg   at [facility] should be easiest to find in the blue box usually on bottom shelf)  take one every 4 hours as needed - extremely effective and inexpensive over the counter- may cause drowsiness so start with just a dose or two an hour before bedtime and see how you tolerate it before trying in daytime.   My office will be contacting you by phone for referral to allergy in [place]   - if you don't hear back from my office within one week please call us back or notify us thru MyChart and we'll address it right away.   Also  Ok to try albuterol 15 min before an activity (on alternating days)  that you know would usually make you short of breath and see if it makes any difference and if makes none then don't take albuterol after activity unless you can't catch your breath as this means it's the resting that helps, not the albuterol.     Pulmonary follow up is as needed

## 2022-07-27 ENCOUNTER — Encounter: Payer: Self-pay | Admitting: Internal Medicine

## 2022-07-27 ENCOUNTER — Ambulatory Visit (INDEPENDENT_AMBULATORY_CARE_PROVIDER_SITE_OTHER): Payer: Medicare Other | Admitting: Family Medicine

## 2022-07-27 VITALS — BP 127/76 | HR 74 | Ht 69.0 in | Wt 212.6 lb

## 2022-07-27 DIAGNOSIS — E1159 Type 2 diabetes mellitus with other circulatory complications: Secondary | ICD-10-CM | POA: Diagnosis not present

## 2022-07-27 DIAGNOSIS — I10 Essential (primary) hypertension: Secondary | ICD-10-CM

## 2022-07-27 DIAGNOSIS — R5383 Other fatigue: Secondary | ICD-10-CM | POA: Diagnosis not present

## 2022-07-27 DIAGNOSIS — R251 Tremor, unspecified: Secondary | ICD-10-CM

## 2022-07-27 NOTE — Assessment & Plan Note (Signed)
Onset 2015 Echo 08/2013:  EF 45%, mod RV dysfxn, normal PA pressures, moderate LAE CXR 03/2014:  No acute process PFTs 2013:  FEV1 2.96 (88%), ratio 76, TLC 6.75 (99%), DLCO 28.59 (92%) PFTss 03/2014:  FEV1 2.82 (86%), ratio 83, TLC 5.39 (79%), DLCO 22.89 (73%) - 10/03/2018   Walked RA  2 laps @  approx 225ft each @ nl pace  stopped due to  End of study, no sob or cough or desats   10/23/2018 reported "as long as walk in straight line, no sob at all, just has it if turns head assoc with dizziness  - PFT's 12/08/2018  FEV1 3.10 (99), ratio 0.81, no BD response, normal DLCO, TLC 5.04 (73%), mild curvature on flow volume loop - Trial Spiriva respimat 2.5 >no change 01/09/2019 so d/c'd permanently  - 01/09/2019   Walked RA  2 laps @  approx 21ft each @ mod to fast pace  stopped due to  End of study, sob with sats still 99%  > pulmonary f/u prn - 06/02/2022  After extensive coaching inhaler device,  effectiveness =   75% with smi > continue stioto 2 each am  - 06/02/2022   Walked on RA  x 2  lap(s) =  approx 300  ft  @ mod pace, stopped due to sob and dizzy  with lowest 02 sats 95% - 06/17/2022   Walked on RA  x   2 lap(s) =  approx 300  ft  @ mod  pace, stopped due to tired and dizzy > sob  with lowest 02 sats 96%   Only thing that helps is depomedrol 120  repeat to day and refer to allergy/ ok to leave inhalers off if convinced not working

## 2022-07-27 NOTE — Assessment & Plan Note (Addendum)
?   Onset p second neck sugery ? Early 2000's with neg w/u By [facility] including MBS/DgEs -  CT Sinus (part of head ct) 06/28/18  wnl  - 10/03/2018 reported at pulmonary eval continuous use of daytime peppermint > rec d/c  - Allergy profile 10/03/2018 >  Eos 0.1/  IgE  104  RAST pos to everything but mold   Eval by allergy remotely and note only feels some relief by depomedrol so rec return to regroup with Allergy   Pulmonary f/u prn   Discussed in detail all the  indications, usual  risks and alternatives  relative to the benefits with patient who agrees to proceed with w/u as outlined.      Each maintenance medication was reviewed in detail including emphasizing most importantly the difference between maintenance and prns and under what circumstances the prns are to be triggered using an action plan format where appropriate.  Total time for H and P, chart review, counseling, reviewing hfa/smi  device(s) and generating customized AVS unique to this office visit / same day charting > 30 min for multiple  refractory respiratory  symptoms of uncertain etiology / final summary ov

## 2022-07-27 NOTE — Progress Notes (Unsigned)
   Subjective:    Patient ID: Lance [name], male    DOB: 07/17/1945, 77 y.o.   MRN: [id number]  HPI Patient arrives today for 3 month follow up. Patient states no concerns or issues.  Patient relates a lot of fatigue tiredness feeling rundown and Tremors in his hands Does not feel good most of the time Stressed out about his own situation as well as his wife's health Patient does not think he is depressed he is concerned there is something medically wrong He has multiple specialist Sees diabetic specialist, pulmonary, cardiology He relates he is feeling fatigued and tired He also relates energy level low.  He feels there is something significantly wrong with what is going on Review of Systems     Objective:   Physical Exam General-in no acute distress Eyes-no discharge Lungs-respiratory rate normal, CTA CV-no murmurs,RRR Extremities skin warm dry no edema Neuro grossly normal Behavior normal, alert Blood pressure sitting standing no drop  Mild tremor noted in the hands     Assessment & Plan:  Tremor-recent scan looked good-will move forward with connecting with neurology to see if medication appropriate at this point  Significant fatigue tiredness check cortisol level may need further testing to rule out adrenal insufficiency Other entities have been looked at the patient does have some level of deconditioning also some level of stress going on to but from a diabetes standpoint heart condition as well as chronic disease seems to be stable  Patient does have a history of depression but we are operating from the point of view that it may not be depression currently patient will do follow-up after further testing await fasting cortisol

## 2022-07-29 DIAGNOSIS — R5383 Other fatigue: Secondary | ICD-10-CM | POA: Diagnosis not present

## 2022-07-30 LAB — CORTISOL: Cortisol: 7.3 ug/dL (ref 6.2–19.4)

## 2022-08-04 ENCOUNTER — Encounter: Payer: Self-pay | Admitting: Internal Medicine

## 2022-08-04 NOTE — Progress Notes (Signed)
[facility] DEVICE PROGRAMMING  Patient Information: Name:  Lance [name]  DOB:  1946/02/29  MRN:  [id number]    COLONOSCOPY WITH PROPOFOL         ESOPHAGOGASTRODUODENOSCOPY (EGD) WITH PROPOFOL    MALONEY DILATION     Daneil Dolin, MD    Device Information:  Clinic EP Physician:  Cristopher Peru, MD   Device Type:  Pacemaker and Defibrillator Manufacturer and Phone #:  St. [name]/[employer]: [phone] Pacemaker Dependent?:  No. Date of Last Device Check:  06/16/22 Remote Normal Device Function?:  Yes.    Electrophysiologist's Recommendations:  Have magnet available. Provide continuous ECG monitoring when magnet is used or reprogramming is to be performed.  Procedure may interfere with device function.  Magnet should be placed over device during procedure.  Per Device Clinic Standing Orders, Wanda Plump, RN  4:21 PM 08/04/2022

## 2022-08-04 NOTE — Patient Instructions (Signed)
[name]  08/04/2022     @PREFPERIOPPHARMACY @   Your procedure is scheduled on  08/11/2022.   Report to [name] at  0730  A.M.   Call this number if you have problems the morning of surgery:  [phone]  If you experience any cold or flu symptoms such as cough, fever, chills, shortness of breath, etc. between now and your scheduled surgery, please notify us at the above number.   Remember:  Follow the diet and prep instructions given to you by the office.         Your last dose of lamotil should have been on 08/03/2022.       Your last dose of farxiga should be on 08/07/2022.       Use your inhaler before you come and bring your rescue inhaler with you.     Take 1/2 of your usual insulin dose the night  before your procedure.     DO NOT take any medications for diabetes the morning of your procedure.     Take these medicines the morning of surgery with A SIP OF WATER         uroxatrol, xanax(if needed), hydrocodone (if needed), namenda, metoprolol, pantoprazole, zoloft.     Do not wear jewelry, make-up or nail polish.  Do not wear lotions, powders, or perfumes, or deodorant.  Do not shave 48 hours prior to surgery.  Men may shave face and neck.  Do not bring valuables to the hospital.  [facility] is not responsible for any belongings or valuables.  Contacts, dentures or bridgework may not be worn into surgery.  Leave your suitcase in the car.  After surgery it may be brought to your room.  For patients admitted to the hospital, discharge time will be determined by your treatment team.  Patients discharged the day of surgery will not be allowed to drive home and must  have someone with them for 24 hours.    Special instructions:   DO NOT smoke tobacco or vape for 24 hours before your procedure.  Please read over the following fact sheets that you were given. Anesthesia Post-op Instructions and Care and Recovery After Surgery      Upper  Endoscopy, Adult, Care After After the procedure, it is common to have a sore throat. It is also common to have: Mild stomach pain or discomfort. Bloating. Nausea. Follow these instructions at home: The instructions below may help you care for yourself at home. Your health care provider may give you more instructions. If you have questions, ask your health care provider. If you were given a sedative during the procedure, it can affect you for several hours. Do not drive or operate machinery until your health care provider says that it is safe. If you will be going home right after the procedure, plan to have a responsible adult: Take you home from the hospital or clinic. You will not be allowed to drive. Care for you for the time you are told. Follow instructions from your health care provider about what you may eat and drink. Return to your normal activities as told by your health care provider. Ask your health care provider what activities are safe for you. Take over-the-counter and prescription medicines only as told by your health care provider. Contact a health care provider if you: Have a sore throat that lasts longer than one day. Have trouble swallowing. Have a fever. Get  help right away if you: Vomit blood or your vomit looks like coffee grounds. Have bloody, black, or tarry stools. Have a very bad sore throat or you cannot swallow. Have difficulty breathing or very bad pain in your chest or abdomen. These symptoms may be an emergency. Get help right away. Call 911. Do not wait to see if the symptoms will go away. Do not drive yourself to the hospital. Summary After the procedure, it is common to have a sore throat, mild stomach discomfort, bloating, and nausea. If you were given a sedative during the procedure, it can affect you for several hours. Do not drive until your health care provider says that it is safe. Follow instructions from your health care provider about what you  may eat and drink. Return to your normal activities as told by your health care provider. This information is not intended to replace advice given to you by your health care provider. Make sure you discuss any questions you have with your health care provider. Document Revised: 07/29/2021 Document Reviewed: 07/29/2021 Elsevier Patient Education  [name]. Esophageal Dilatation Esophageal dilatation, also called esophageal dilation, is a procedure to widen or open a blocked or narrowed part of the esophagus. The esophagus is the part of the body that moves food and liquid from the mouth to the stomach. You may need this procedure if: You have a buildup of scar tissue in your esophagus that makes it difficult, painful, or impossible to swallow. This can be caused by gastroesophageal reflux disease (GERD). You have cancer of the esophagus. There is a problem with how food moves through your esophagus. In some cases, you may need this procedure repeated at a later time to dilate the esophagus gradually. Tell a health care provider about: Any allergies you have. All medicines you are taking, including vitamins, herbs, eye drops, creams, and over-the-counter medicines. Any problems you or family members have had with anesthetic medicines. Any blood disorders you have. Any surgeries you have had. Any medical conditions you have. Any antibiotic medicines you are required to take before dental procedures. Whether you are pregnant or may be pregnant. What are the risks? Generally, this is a safe procedure. However, problems may occur, including: Bleeding due to a tear in the lining of the esophagus. A hole, or perforation, in the esophagus. What happens before the procedure? Ask your health care provider about: Changing or stopping your regular medicines. This is especially important if you are taking diabetes medicines or blood thinners. Taking medicines such as aspirin and ibuprofen.  These medicines can thin your blood. Do not take these medicines unless your health care provider tells you to take them. Taking over-the-counter medicines, vitamins, herbs, and supplements. Follow instructions from your health care provider about eating or drinking restrictions. Plan to have a responsible adult take you home from the hospital or clinic. Plan to have a responsible adult care for you for the time you are told after you leave the hospital or clinic. This is important. What happens during the procedure? You may be given a medicine to help you relax (sedative). A numbing medicine may be sprayed into the back of your throat, or you may gargle the medicine. Your health care provider may perform the dilatation using various surgical instruments, such as: Simple dilators. This instrument is carefully placed in the esophagus to stretch it. Guided wire bougies. This involves using an endoscope to insert a wire into the esophagus. A dilator is passed over this  wire to enlarge the esophagus. Then the wire is removed. Balloon dilators. An endoscope with a small balloon is inserted into the esophagus. The balloon is inflated to stretch the esophagus and open it up. The procedure may vary among health care providers and hospitals. What can I expect after the procedure? Your blood pressure, heart rate, breathing rate, and blood oxygen level will be monitored until you leave the hospital or clinic. Your throat may feel slightly sore and numb. This will get better over time. You will not be allowed to eat or drink until your throat is no longer numb. When you are able to drink, urinate, and sit on the edge of the bed without nausea or dizziness, you may be able to return home. Follow these instructions at home: Take over-the-counter and prescription medicines only as told by your health care provider. If you were given a sedative during the procedure, it can affect you for several hours. Do not  drive or operate machinery until your health care provider says that it is safe. Plan to have a responsible adult care for you for the time you are told. This is important. Follow instructions from your health care provider about any eating or drinking restrictions. Do not use any products that contain nicotine or tobacco, such as cigarettes, e-cigarettes, and chewing tobacco. If you need help quitting, ask your health care provider. Keep all follow-up visits. This is important. Contact a health care provider if: You have a fever. You have pain that is not relieved by medicine. Get help right away if: You have chest pain. You have trouble breathing. You have trouble swallowing. You vomit blood. You have black, tarry, or bloody stools. These symptoms may represent a serious problem that is an emergency. Do not wait to see if the symptoms will go away. Get medical help right away. Call your local emergency services (911 in the [place] Do not drive yourself to the hospital. Summary Esophageal dilatation, also called esophageal dilation, is a procedure to widen or open a blocked or narrowed part of the esophagus. Plan to have a responsible adult take you home from the hospital or clinic. For this procedure, a numbing medicine may be sprayed into the back of your throat, or you may gargle the medicine. Do not drive or operate machinery until your health care provider says that it is safe. This information is not intended to replace advice given to you by your health care provider. Make sure you discuss any questions you have with your health care provider. Document Revised: 09/05/2019 Document Reviewed: 09/05/2019 Elsevier Patient Education  [place]. Colonoscopy, Adult, Care After The following information offers guidance on how to care for yourself after your procedure. Your health care provider may also give you more specific instructions. If you have problems or questions, contact  your health care provider. What can I expect after the procedure? After the procedure, it is common to have: A small amount of blood in your stool for 24 hours after the procedure. Some gas. Mild cramping or bloating of your abdomen. Follow these instructions at home: Eating and drinking  Drink enough fluid to keep your urine pale yellow. Follow instructions from your health care provider about eating or drinking restrictions. Resume your normal diet as told by your health care provider. Avoid heavy or fried foods that are hard to digest. Activity Rest as told by your health care provider. Avoid sitting for a long time without moving. Get up to take  short walks every 1-2 hours. This is important to improve blood flow and breathing. Ask for help if you feel weak or unsteady. Return to your normal activities as told by your health care provider. Ask your health care provider what activities are safe for you. Managing cramping and bloating  Try walking around when you have cramps or feel bloated. If directed, apply heat to your abdomen as told by your health care provider. Use the heat source that your health care provider recommends, such as a moist heat pack or a heating pad. Place a towel between your skin and the heat source. Leave the heat on for 20-30 minutes. Remove the heat if your skin turns bright red. This is especially important if you are unable to feel pain, heat, or cold. You have a greater risk of getting burned. General instructions If you were given a sedative during the procedure, it can affect you for several hours. Do not drive or operate machinery until your health care provider says that it is safe. For the first 24 hours after the procedure: Do not sign important documents. Do not drink alcohol. Do your regular daily activities at a slower pace than normal. Eat soft foods that are easy to digest. Take over-the-counter and prescription medicines only as told by your  health care provider. Keep all follow-up visits. This is important. Contact a health care provider if: You have blood in your stool 2-3 days after the procedure. Get help right away if: You have more than a small spotting of blood in your stool. You have large blood clots in your stool. You have swelling of your abdomen. You have nausea or vomiting. You have a fever. You have increasing pain in your abdomen that is not relieved with medicine. These symptoms may be an emergency. Get help right away. Call 911. Do not wait to see if the symptoms will go away. Do not drive yourself to the hospital. Summary After the procedure, it is common to have a small amount of blood in your stool. You may also have mild cramping and bloating of your abdomen. If you were given a sedative during the procedure, it can affect you for several hours. Do not drive or operate machinery until your health care provider says that it is safe. Get help right away if you have a lot of blood in your stool, nausea or vomiting, a fever, or increased pain in your abdomen. This information is not intended to replace advice given to you by your health care provider. Make sure you discuss any questions you have with your health care provider. Document Revised: 12/10/2020 Document Reviewed: 12/10/2020 Elsevier Patient Education  [place] After The following information offers guidance on how to care for yourself after your procedure. Your health care provider may also give you more specific instructions. If you have problems or questions, contact your health care provider. What can I expect after the procedure? After the procedure, it is common to have: Tiredness. Little or no memory about what happened during or after the procedure. Impaired judgment when it comes to making decisions. Nausea or vomiting. Some trouble with balance. Follow these instructions at home: For the time  period you were told by your health care provider:  Rest. Do not participate in activities where you could fall or become injured. Do not drive or use machinery. Do not drink alcohol. Do not take sleeping pills or medicines that cause drowsiness. Do not make  important decisions or sign legal documents. Do not take care of children on your own. Medicines Take over-the-counter and prescription medicines only as told by your health care provider. If you were prescribed antibiotics, take them as told by your health care provider. Do not stop using the antibiotic even if you start to feel better. Eating and drinking Follow instructions from your health care provider about what you may eat and drink. Drink enough fluid to keep your urine pale yellow. If you vomit: Drink clear fluids slowly and in small amounts as you are able. Clear fluids include water, ice chips, low-calorie sports drinks, and fruit juice that has water added to it (diluted fruit juice). Eat light and bland foods in small amounts as you are able. These foods include bananas, applesauce, rice, lean meats, toast, and crackers. General instructions  Have a responsible adult stay with you for the time you are told. It is important to have someone help care for you until you are awake and alert. If you have sleep apnea, surgery and some medicines can increase your risk for breathing problems. Follow instructions from your health care provider about wearing your sleep device: When you are sleeping. This includes during daytime naps. While taking prescription pain medicines, sleeping medicines, or medicines that make you drowsy. Do not use any products that contain nicotine or tobacco. These products include cigarettes, chewing tobacco, and vaping devices, such as e-cigarettes. If you need help quitting, ask your health care provider. Contact a health care provider if: You feel nauseous or vomit every time you eat or drink. You feel  light-headed. You are still sleepy or having trouble with balance after 24 hours. You get a rash. You have a fever. You have redness or swelling around the IV site. Get help right away if: You have trouble breathing. You have new confusion after you get home. These symptoms may be an emergency. Get help right away. Call 911. Do not wait to see if the symptoms will go away. Do not drive yourself to the hospital. This information is not intended to replace advice given to you by your health care provider. Make sure you discuss any questions you have with your health care provider. Document Revised: 09/14/2021 Document Reviewed: 09/14/2021 Elsevier Patient Education  [place].

## 2022-08-05 ENCOUNTER — Telehealth: Payer: Self-pay | Admitting: Nurse Practitioner

## 2022-08-05 ENCOUNTER — Other Ambulatory Visit: Payer: Self-pay | Admitting: Family Medicine

## 2022-08-05 ENCOUNTER — Encounter: Payer: Self-pay | Admitting: Nurse Practitioner

## 2022-08-05 ENCOUNTER — Ambulatory Visit: Payer: Medicare Other | Attending: Nurse Practitioner | Admitting: Nurse Practitioner

## 2022-08-05 VITALS — BP 120/70 | HR 59 | Ht 69.0 in | Wt 213.8 lb

## 2022-08-05 DIAGNOSIS — R42 Dizziness and giddiness: Secondary | ICD-10-CM | POA: Diagnosis not present

## 2022-08-05 DIAGNOSIS — Z95810 Presence of automatic (implantable) cardiac defibrillator: Secondary | ICD-10-CM

## 2022-08-05 DIAGNOSIS — I2510 Atherosclerotic heart disease of native coronary artery without angina pectoris: Secondary | ICD-10-CM

## 2022-08-05 DIAGNOSIS — I951 Orthostatic hypotension: Secondary | ICD-10-CM | POA: Diagnosis not present

## 2022-08-05 DIAGNOSIS — I10 Essential (primary) hypertension: Secondary | ICD-10-CM

## 2022-08-05 DIAGNOSIS — Z01810 Encounter for preprocedural cardiovascular examination: Secondary | ICD-10-CM

## 2022-08-05 DIAGNOSIS — G459 Transient cerebral ischemic attack, unspecified: Secondary | ICD-10-CM

## 2022-08-05 DIAGNOSIS — R0609 Other forms of dyspnea: Secondary | ICD-10-CM

## 2022-08-05 DIAGNOSIS — E785 Hyperlipidemia, unspecified: Secondary | ICD-10-CM | POA: Diagnosis not present

## 2022-08-05 DIAGNOSIS — Z8674 Personal history of sudden cardiac arrest: Secondary | ICD-10-CM | POA: Diagnosis not present

## 2022-08-05 DIAGNOSIS — R5383 Other fatigue: Secondary | ICD-10-CM | POA: Diagnosis not present

## 2022-08-05 MED ORDER — METOPROLOL SUCCINATE ER 25 MG PO TB24: 12.5000 mg | ORAL_TABLET | Freq: Every day | ORAL | 1 refills | Status: DC

## 2022-08-05 NOTE — Progress Notes (Signed)
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 complications (estimated to be 1 in 10,000)], benefits (risk stratification, diagnosing coronary artery disease, treatment guidance) and alternatives of a nuclear stress test were discussed in detail with Mr. Mangels and he agrees to proceed.  HTN  BP stable today. Given BP log today and discussed to bring this to follow-up visit.  Discussed to monitor BP at home at least 2 hours after medications and sitting for 5-10 minutes. Decreasing metoprolol as mentioned below. No other med changes. Heart healthy diet and regular cardiovascular exercise encouraged.   HLD Lipid panel from July 2023 revealed total cholesterol 157, triglycerides 95, LDL 98, and HDL 41.  Continue current medication regimen. Heart healthy diet and  increasing physical activity as tolerated encouraged.   Hx of sudden cardiac death, s/p ICD Followed by Dr. Lovena Le.  Recent remote device check revealed normal device function.  No changes were recommended.  Continue to follow-up with EP. Heart healthy diet and regular cardiovascular exercise as tolerated encouraged.   Fatigue, lethargy Etiology multifactorial, chronic. Recent Echo unremarkable. Past labs overall stable.  Stop bang score 5, defers sleep study.  He is on quite a few medications that could be making him drowsy and fatigued, I recommended he follow-up with his PCP regarding this. Heart healthy diet and regular cardiovascular exercise as tolerated encouraged.   Orthostatic hypotension, dizziness Past orthostatics positive for drop while going from supine to sitting, unable to complete d/t weakness. Discussed conservative measures, decreasing Metoprolol to improve symptoms. Heart healthy diet recommended. Continue to f/u with Ortho and Neurologist. If no improvement by next visit, plan to start low dose midodrine if needed.   Hx of TIA Denies any strokelike symptoms recently.  Continue current medication regimen. Continue to follow with PCP and Neuro. Heart healthy diet and regular cardiovascular exercise as tolerated encouraged.     9. Disposition: Follow-up with me or APP in 6-8 weeks or sooner if anything changes.   Medication Adjustments/Labs and Tests Ordered: Current medicines are reviewed at length with the patient today.  Concerns regarding medicines are outlined above.  Orders Placed This Encounter  Procedures   NM Myocar Multi W/Spect W/Wall Motion / EF   Meds ordered this encounter  Medications   metoprolol succinate (TOPROL XL) 25 MG 24 hr tablet    Sig: Take 0.5 tablets (12.5 mg total) by mouth daily.    Dispense:  45 tablet    Refill:  1    08/05/22 Decrease dosage    Patient Instructions  Medication Instructions:  Your physician has recommended you make the  following change in your medication:  DECREASE metoprolol to 12.5 mg (1/2 tablet) once a day Continue all other medications as  directed  Labwork: none  Testing/Procedures: Your physician has requested that you have a lexiscan myoview. For further information please visit [email or web address]. Please follow instruction sheet, as given.   Follow-Up:  Your physician recommends that you schedule a follow-up appointment in: 2-3 months  Any Other Special Instructions Will Be Listed Below (If Applicable).  If you need a refill on your cardiac medications before your next appointment, please call your pharmacy.    SignedFinis Bud, NP  08/05/2022 8:42 PM    [place]

## 2022-08-05 NOTE — Addendum Note (Signed)
Addended by: Finis Bud on: 08/05/2022 08:53 PM   Modules accepted: Orders

## 2022-08-05 NOTE — Patient Instructions (Addendum)
Medication Instructions:  Your physician has recommended you make the following change in your medication:  DECREASE metoprolol to 12.5 mg (1/2 tablet) once a day Continue all other medications as directed  Labwork: none  Testing/Procedures: Your physician has requested that you have a lexiscan myoview. For further information please visit [email or web address]. Please follow instruction sheet, as given.   Follow-Up:  Your physician recommends that you schedule a follow-up appointment in: 2-3 months  Any Other Special Instructions Will Be Listed Below (If Applicable).  If you need a refill on your cardiac medications before your next appointment, please call your pharmacy.

## 2022-08-05 NOTE — Telephone Encounter (Signed)
Checking percert on the following patient for testing scheduled at [facility].    LEXISCAN    08/09/2022

## 2022-08-06 ENCOUNTER — Encounter (HOSPITAL_COMMUNITY)
Admission: RE | Admit: 2022-08-06 | Discharge: 2022-08-06 | Disposition: A | Discharge status: Home / Self Care | Payer: Medicare Other | Source: Ambulatory Visit | Attending: Internal Medicine | Admitting: Internal Medicine

## 2022-08-06 ENCOUNTER — Encounter (HOSPITAL_COMMUNITY): Payer: Self-pay

## 2022-08-06 VITALS — BP 120/62 | HR 70 | Temp 97.5°F | Resp 18 | Ht 69.0 in | Wt 213.8 lb

## 2022-08-06 DIAGNOSIS — I10 Essential (primary) hypertension: Secondary | ICD-10-CM | POA: Insufficient documentation

## 2022-08-06 DIAGNOSIS — E1159 Type 2 diabetes mellitus with other circulatory complications: Secondary | ICD-10-CM | POA: Insufficient documentation

## 2022-08-06 DIAGNOSIS — Z01812 Encounter for preprocedural laboratory examination: Secondary | ICD-10-CM | POA: Diagnosis not present

## 2022-08-06 LAB — BASIC METABOLIC PANEL
Anion gap: 7 (ref 5–15)
BUN: 15 mg/dL (ref 8–23)
CO2: 23 mmol/L (ref 22–32)
Calcium: 8.6 mg/dL — ABNORMAL LOW (ref 8.9–10.3)
Chloride: 106 mmol/L (ref 98–111)
Creatinine, Ser: 0.94 mg/dL (ref 0.61–1.24)
GFR, Estimated: >60 mL/min (ref >60)
Glucose, Bld: 179 mg/dL — ABNORMAL HIGH (ref 70–99)
Potassium: 3.9 mmol/L (ref 3.5–5.1)
Sodium: 136 mmol/L (ref 135–145)

## 2022-08-06 NOTE — Progress Notes (Signed)
   08/06/22 1211  OBSTRUCTIVE SLEEP APNEA  Have you ever been diagnosed with sleep apnea through a sleep study? No  Do you snore loudly (loud enough to be heard through closed doors)?  1  Do you often feel tired, fatigued, or sleepy during the daytime (such as falling asleep during driving or talking to someone)? 0  Has anyone observed you stop breathing during your sleep? 0  Do you have, or are you being treated for high blood pressure? 1  BMI more than 35 kg/m2? 1  Age > 50 (1-yes) 1  Neck circumference greater than:Male 16 inches or larger, Male 17inches or larger? 0  Male Gender (Yes=1) 1  Obstructive Sleep Apnea Score 5

## 2022-08-09 ENCOUNTER — Encounter (HOSPITAL_COMMUNITY)
Admission: RE | Admit: 2022-08-09 | Discharge: 2022-08-09 | Disposition: A | Discharge status: Home / Self Care | Payer: Medicare Other | Source: Ambulatory Visit | Attending: Nurse Practitioner | Admitting: Nurse Practitioner

## 2022-08-09 ENCOUNTER — Ambulatory Visit (HOSPITAL_COMMUNITY)
Admission: RE | Admit: 2022-08-09 | Discharge: 2022-08-09 | Disposition: A | Discharge status: Home / Self Care | Payer: Medicare Other | Source: Ambulatory Visit | Attending: Cardiology | Admitting: Cardiology

## 2022-08-09 DIAGNOSIS — R0609 Other forms of dyspnea: Secondary | ICD-10-CM | POA: Insufficient documentation

## 2022-08-09 MED ORDER — TECHNETIUM TC 99M TETROFOSMIN IV KIT
10.5000 | PACK | Freq: Once | INTRAVENOUS | Status: AC | PRN
  Administered 2022-08-09: 10.5 via INTRAVENOUS

## 2022-08-09 MED ORDER — REGADENOSON 0.4 MG/5ML IV SOLN
INTRAVENOUS | Status: AC
  Administered 2022-08-09: 0.4 mg via INTRAVENOUS
  Filled 2022-08-09: qty 5

## 2022-08-09 MED ORDER — SODIUM CHLORIDE FLUSH 0.9 % IV SOLN
INTRAVENOUS | Status: AC
  Administered 2022-08-09: 10 mL via INTRAVENOUS
  Filled 2022-08-09: qty 10

## 2022-08-09 MED ORDER — TECHNETIUM TC 99M TETROFOSMIN IV KIT
30.8000 | PACK | Freq: Once | INTRAVENOUS | Status: AC | PRN
  Administered 2022-08-09: 30.8 via INTRAVENOUS

## 2022-08-11 ENCOUNTER — Encounter (HOSPITAL_COMMUNITY): Payer: Self-pay | Admitting: Internal Medicine

## 2022-08-11 ENCOUNTER — Ambulatory Visit (HOSPITAL_BASED_OUTPATIENT_CLINIC_OR_DEPARTMENT_OTHER): Payer: Medicare Other | Admitting: Certified Registered"

## 2022-08-11 ENCOUNTER — Ambulatory Visit (HOSPITAL_COMMUNITY)
Admission: RE | Admit: 2022-08-11 | Discharge: 2022-08-11 | Disposition: A | Discharge status: Home / Self Care | Payer: Medicare Other | Attending: Internal Medicine | Admitting: Internal Medicine

## 2022-08-11 ENCOUNTER — Ambulatory Visit (HOSPITAL_COMMUNITY): Payer: Medicare Other | Admitting: Certified Registered"

## 2022-08-11 ENCOUNTER — Encounter (HOSPITAL_COMMUNITY)
Admission: RE | Disposition: A | Discharge status: Home / Self Care | Payer: Self-pay | Source: Home / Self Care | Attending: Internal Medicine

## 2022-08-11 DIAGNOSIS — I2510 Atherosclerotic heart disease of native coronary artery without angina pectoris: Secondary | ICD-10-CM | POA: Diagnosis not present

## 2022-08-11 DIAGNOSIS — I252 Old myocardial infarction: Secondary | ICD-10-CM | POA: Insufficient documentation

## 2022-08-11 DIAGNOSIS — R1310 Dysphagia, unspecified: Secondary | ICD-10-CM

## 2022-08-11 DIAGNOSIS — K3189 Other diseases of stomach and duodenum: Secondary | ICD-10-CM | POA: Diagnosis not present

## 2022-08-11 DIAGNOSIS — I110 Hypertensive heart disease with heart failure: Secondary | ICD-10-CM | POA: Diagnosis not present

## 2022-08-11 DIAGNOSIS — I509 Heart failure, unspecified: Secondary | ICD-10-CM | POA: Insufficient documentation

## 2022-08-11 DIAGNOSIS — Z87891 Personal history of nicotine dependence: Secondary | ICD-10-CM | POA: Insufficient documentation

## 2022-08-11 DIAGNOSIS — J449 Chronic obstructive pulmonary disease, unspecified: Secondary | ICD-10-CM | POA: Insufficient documentation

## 2022-08-11 DIAGNOSIS — D124 Benign neoplasm of descending colon: Secondary | ICD-10-CM | POA: Diagnosis not present

## 2022-08-11 DIAGNOSIS — K219 Gastro-esophageal reflux disease without esophagitis: Secondary | ICD-10-CM | POA: Diagnosis not present

## 2022-08-11 DIAGNOSIS — R197 Diarrhea, unspecified: Secondary | ICD-10-CM | POA: Insufficient documentation

## 2022-08-11 DIAGNOSIS — Z1211 Encounter for screening for malignant neoplasm of colon: Secondary | ICD-10-CM

## 2022-08-11 DIAGNOSIS — Q438 Other specified congenital malformations of intestine: Secondary | ICD-10-CM | POA: Insufficient documentation

## 2022-08-11 DIAGNOSIS — K635 Polyp of colon: Secondary | ICD-10-CM | POA: Diagnosis not present

## 2022-08-11 DIAGNOSIS — D122 Benign neoplasm of ascending colon: Secondary | ICD-10-CM | POA: Diagnosis not present

## 2022-08-11 DIAGNOSIS — Z86010 Personal history of colonic polyps: Secondary | ICD-10-CM | POA: Diagnosis not present

## 2022-08-11 HISTORY — PX: POLYPECTOMY: SHX5525

## 2022-08-11 HISTORY — PX: MALONEY DILATION: SHX5535

## 2022-08-11 HISTORY — PX: BIOPSY: SHX5522

## 2022-08-11 HISTORY — PX: ESOPHAGOGASTRODUODENOSCOPY (EGD) WITH PROPOFOL: SHX5813

## 2022-08-11 HISTORY — PX: COLONOSCOPY WITH PROPOFOL: SHX5780

## 2022-08-11 LAB — GLUCOSE, CAPILLARY: Glucose-Capillary: 154 mg/dL — ABNORMAL HIGH (ref 70–99)

## 2022-08-11 SURGERY — COLONOSCOPY WITH PROPOFOL
Anesthesia: General

## 2022-08-11 MED ORDER — PROPOFOL 500 MG/50ML IV EMUL
INTRAVENOUS | Status: DC | PRN
  Administered 2022-08-11: 150 ug/kg/min via INTRAVENOUS

## 2022-08-11 MED ORDER — STERILE WATER FOR IRRIGATION IR SOLN
Status: DC | PRN
  Administered 2022-08-11: 120 mL

## 2022-08-11 MED ORDER — EPHEDRINE SULFATE-NACL 50-0.9 MG/10ML-% IV SOSY
PREFILLED_SYRINGE | INTRAVENOUS | Status: DC | PRN
  Administered 2022-08-11 (×2): 5 mg via INTRAVENOUS

## 2022-08-11 MED ORDER — EPHEDRINE 5 MG/ML INJ
INTRAVENOUS | Status: AC
  Filled 2022-08-11: qty 5

## 2022-08-11 MED ORDER — LACTATED RINGERS IV SOLN: INTRAVENOUS | Status: DC | PRN

## 2022-08-11 MED ORDER — PHENYLEPHRINE 80 MCG/ML (10ML) SYRINGE FOR IV PUSH (FOR BLOOD PRESSURE SUPPORT)
PREFILLED_SYRINGE | INTRAVENOUS | Status: DC | PRN
  Administered 2022-08-11 (×5): 160 ug via INTRAVENOUS

## 2022-08-11 MED ORDER — PROPOFOL 10 MG/ML IV BOLUS
INTRAVENOUS | Status: DC | PRN
  Administered 2022-08-11: 20 mg via INTRAVENOUS
  Administered 2022-08-11: 80 mg via INTRAVENOUS

## 2022-08-11 MED ORDER — LIDOCAINE HCL (CARDIAC) PF 100 MG/5ML IV SOSY
PREFILLED_SYRINGE | INTRAVENOUS | Status: DC | PRN
  Administered 2022-08-11: 50 mg via INTRAVENOUS

## 2022-08-11 MED ORDER — PHENYLEPHRINE 80 MCG/ML (10ML) SYRINGE FOR IV PUSH (FOR BLOOD PRESSURE SUPPORT)
PREFILLED_SYRINGE | INTRAVENOUS | Status: AC
  Filled 2022-08-11: qty 10

## 2022-08-11 NOTE — Op Note (Signed)
[facility] Patient Name: Lance [name] Procedure Date: 08/11/2022 9:36 AM MRN: [id number] Date of Birth: 09/10/1945 Attending MD: Gennette Pac , MD, [id number] CSN: [id number] Age: 77 Admit Type: Outpatient Procedure:                Colonoscopy Indications:              High risk colon cancer surveillance: Personal                            history of colonic polyps and diarrhea Providers:                Gennette Pac, MD, Angelica Ran, Pandora Leiter, Technician Referring MD:              Medicines:                Propofol per Anesthesia Complications:            No immediate complications. Estimated Blood Loss:     Estimated blood loss was minimal. Procedure:                Pre-Anesthesia Assessment:                           - Prior to the procedure, a History and Physical                            was performed, and patient medications and                            allergies were reviewed. The patient's tolerance of                            previous anesthesia was also reviewed. The risks                            and benefits of the procedure and the sedation                            options and risks were discussed with the patient.                            All questions were answered, and informed consent                            was obtained. Prior Anticoagulants: The patient has                            taken no anticoagulant or antiplatelet agents. ASA                            Grade Assessment: III - A patient with severe  systemic disease. After reviewing the risks and                            benefits, the patient was deemed in satisfactory                            condition to undergo the procedure.                           After obtaining informed consent, the colonoscope                            was passed under direct vision. Throughout the                             procedure, the patient's blood pressure, pulse, and                            oxygen saturations were monitored continuously. The                            [id number]) scope was introduced through the                            anus and advanced to the the cecum, identified by                            appendiceal orifice and ileocecal valve. The                            colonoscopy was performed without difficulty. The                            patient tolerated the procedure well. The quality                            of the bowel preparation was adequate. The                            ileocecal valve, appendiceal orifice, and rectum                            were photographed. The colonoscopy was performed                            without difficulty. The patient tolerated the                            procedure well. The quality of the bowel                            preparation was adequate. Scope In: 10:22:20 AM Scope Out: 10:39:40 AM Scope Withdrawal Time: 0 hours 9 minutes 6 seconds  Total Procedure Duration: 0 hours 17 minutes 20 seconds  Findings:      The perianal and digital rectal examinations were normal. Colon was       elongated and redundant. External abdominal pressure and change in the       patient's position required to reach the cecum.      A 5 mm polyp was found in the mid descending colon. The polyp was       sessile. The polyp was removed with a cold snare. Resection and       retrieval were complete. Estimated blood loss was minimal. Segmental       biopsies of right and left colon taken for histologic study      The exam was otherwise without abnormality on direct and retroflexion       views. Impression:               - One 5 mm polyp in the mid descending colon,                            removed with a cold snare. resected and retrieved.                            Status post segmental biopsy. Elongated and                             redundant colon.                           - The examination was otherwise normal on direct                            and retroflexion views. Moderate Sedation:      Moderate (conscious) sedation was personally administered by an       anesthesia professional. The following parameters were monitored: oxygen       saturation, heart rate, blood pressure, respiratory rate, EKG, adequacy       of pulmonary ventilation, and response to care. Recommendation:           - Patient has a contact number available for                            emergencies. The signs and symptoms of potential                            delayed complications were discussed with the                            patient. Return to normal activities tomorrow.                            Written discharge instructions were provided to the                            patient.                           - Advance diet as tolerated.                           -  Continue present medications.                           - Repeat colonoscopy date to be determined after                            pending pathology results are reviewed for                            surveillance.                           - Return to GI office in 6 weeks. See EGD report. Procedure Code(s):        --- Professional ---                           [phone] [id number], Colonoscopy, flexible; with removal of                            tumor(s), polyp(s), or other lesion(s) by snare                            technique Diagnosis Code(s):        --- Professional ---                           Z86.010, Personal history of colonic polyps                           D12.4, Benign neoplasm of descending colon CPT copyright 2022 American Medical Association. All rights reserved. The codes documented in this report are preliminary and upon coder review may  be revised to meet current compliance requirements. Gerrit Friendsobert M. Cartrell Bentsen, MD Gennette Pacobert Michael Paraskevi Funez, MD 08/11/2022 10:47:34 AM This  report has been signed electronically. Number of Addenda: 0

## 2022-08-11 NOTE — Interval H&P Note (Signed)
History and Physical Interval Note:  08/11/2022 9:43 AM  Lance [name]  has presented today for surgery, with the diagnosis of DYSPHAGIA, RUQ PAIN, diarrhea, h/o colon polyps.  The various methods of treatment have been discussed with the patient and family. After consideration of risks, benefits and other options for treatment, the patient has consented to  Procedure(s) with comments: COLONOSCOPY WITH PROPOFOL (N/A) - 930am, asa 4, pt has a defibrillator ESOPHAGOGASTRODUODENOSCOPY (EGD) WITH PROPOFOL (N/A) MALONEY DILATION (N/A) as a surgical intervention.  The patient's history has been reviewed, patient examined, no change in status, stable for surgery.  I have reviewed the patient's chart and labs.  Questions were answered to the patient's satisfaction.     [name]  Patient here for evaluation of dysphagia to solids.  Surveillance colonoscopy.  Recent altered bowel function.  Goes week with normal bowel function and has bouts of diarrhea then back to normal.  GIP C. difficile negative.  Stress test earlier this week was also normal.  EGD with esophageal dilation is feasible/appropriate colonoscopy today.  He has a AICD in place.  Discussed with anesthesia. The risks, benefits, limitations, imponderables and alternatives regarding both EGD and colonoscopy have been reviewed with the patient. Questions have been answered. All parties agreeable.

## 2022-08-11 NOTE — Discharge Instructions (Addendum)
EGD Discharge instructions Please read the instructions outlined below and refer to this sheet in the next few weeks. These discharge instructions provide you with general information on caring for yourself after you leave the hospital. Your doctor may also give you specific instructions. While your treatment has been planned according to the most current medical practices available, unavoidable complications occasionally occur. If you have any problems or questions after discharge, please call your doctor. ACTIVITY You may resume your regular activity but move at a slower pace for the next 24 hours.  Take frequent rest periods for the next 24 hours.  Walking will help expel (get rid of) the air and reduce the bloated feeling in your abdomen.  No driving for 24 hours (because of the anesthesia (medicine) used during the test).  You may shower.  Do not sign any important legal documents or operate any machinery for 24 hours (because of the anesthesia used during the test).  NUTRITION Drink plenty of fluids.  You may resume your normal diet.  Begin with a light meal and progress to your normal diet.  Avoid alcoholic beverages for 24 hours or as instructed by your caregiver.  MEDICATIONS You may resume your normal medications unless your caregiver tells you otherwise.  WHAT YOU CAN EXPECT TODAY You may experience abdominal discomfort such as a feeling of fullness or "gas" pains.  FOLLOW-UP Your doctor will discuss the results of your test with you.  SEEK IMMEDIATE MEDICAL ATTENTION IF ANY OF THE FOLLOWING OCCUR: Excessive nausea (feeling sick to your stomach) and/or vomiting.  Severe abdominal pain and distention (swelling).  Trouble swallowing.  Temperature over 101 F (37.8 C).  Rectal bleeding or vomiting of blood.     Colonoscopy Discharge Instructions  Read the instructions outlined below and refer to this sheet in the next few weeks. These discharge instructions provide you with  general information on caring for yourself after you leave the hospital. Your doctor may also give you specific instructions. While your treatment has been planned according to the most current medical practices available, unavoidable complications occasionally occur. If you have any problems or questions after discharge, call Dr. Gala Romney at [phone]. ACTIVITY You may resume your regular activity, but move at a slower pace for the next 24 hours.  Take frequent rest periods for the next 24 hours.  Walking will help get rid of the air and reduce the bloated feeling in your belly (abdomen).  No driving for 24 hours (because of the medicine (anesthesia) used during the test).   Do not sign any important legal documents or operate any machinery for 24 hours (because of the anesthesia used during the test).  NUTRITION Drink plenty of fluids.  You may resume your normal diet as instructed by your doctor.  Begin with a light meal and progress to your normal diet. Heavy or fried foods are harder to digest and may make you feel sick to your stomach (nauseated).  Avoid alcoholic beverages for 24 hours or as instructed.  MEDICATIONS You may resume your normal medications unless your doctor tells you otherwise.  WHAT YOU CAN EXPECT TODAY Some feelings of bloating in the abdomen.  Passage of more gas than usual.  Spotting of blood in your stool or on the toilet paper.  IF YOU HAD POLYPS REMOVED DURING THE COLONOSCOPY: No aspirin products for 7 days or as instructed.  No alcohol for 7 days or as instructed.  Eat a soft diet for the next 24 hours.  FINDING OUT THE RESULTS OF YOUR TEST Not all test results are available during your visit. If your test results are not back during the visit, make an appointment with your caregiver to find out the results. Do not assume everything is normal if you have not heard from your caregiver or the medical facility. It is important for you to follow up on all of your test  results.  SEEK IMMEDIATE MEDICAL ATTENTION IF: You have more than a spotting of blood in your stool.  Your belly is swollen (abdominal distention).  You are nauseated or vomiting.  You have a temperature over 101.  You have abdominal pain or discomfort that is severe or gets worse throughout the day.    Your duodenum and stomach looked a little inflamed.  Biopsies were taken.  Your esophagus was stretched.  1 polyp in your colon removed samples taken to assess for the cause of diarrhea  Further recommendations to follow pending review of pathology report  Office visit with [place] in 6 weeks  Patient request I called Bernis Milles at [phone] to voicemail "voicemail not set up"

## 2022-08-11 NOTE — Op Note (Signed)
[facility] Patient Name: Lance [name] Procedure Date: 08/11/2022 9:37 AM MRN: [id number] Date of Birth: 01-03-1946 Attending MD: Gennette Pac , MD, [id number] CSN: [id number] Age: 77 Admit Type: Outpatient Procedure:                Upper GI endoscopy Indications:              Dysphagia Providers:                Gennette Pac, MD, Angelica Ran, Pandora Leiter, Technician Referring MD:              Medicines:                Propofol per Anesthesia Complications:            No immediate complications. Estimated Blood Loss:     Estimated blood loss was minimal. Estimated blood                            loss was minimal. Procedure:                Pre-Anesthesia Assessment:                           - Prior to the procedure, a History and Physical                            was performed, and patient medications and                            allergies were reviewed. The patient's tolerance of                            previous anesthesia was also reviewed. The risks                            and benefits of the procedure and the sedation                            options and risks were discussed with the patient.                            All questions were answered, and informed consent                            was obtained. Prior Anticoagulants: The patient has                            taken no anticoagulant or antiplatelet agents. ASA                            Grade Assessment: III - A patient with severe  systemic disease. After reviewing the risks and                            benefits, the patient was deemed in satisfactory                            condition to undergo the procedure.                           After obtaining informed consent, the endoscope was                            passed under direct vision. Throughout the                            procedure, the patient's blood pressure,  pulse, and                            oxygen saturations were monitored continuously. The                            [id number] ([id number] scope was introduced through the                            mouth, and advanced to the second part of duodenum.                            The upper GI endoscopy was accomplished without                            difficulty. The patient tolerated the procedure                            well. The upper GI endoscopy was accomplished                            without difficulty. The patient tolerated the                            procedure well. Scope In: 10:03:56 AM Scope Out: 10:15:39 AM Total Procedure Duration: 0 hours 11 minutes 43 seconds  Findings:      The examined esophagus was normal. Subtle "mottled" close "mottled"       appearance of the gastric mucosa diffusely. No ulcer or infiltrating       process seen. Patent pylorus. Examination bulb and second portion       revealed similar mild mottling of the D2 mucosa. Otherwise normal. The       scope was withdrawn. Dilation was performed with a Maloney dilator with       mild resistance at 56 Fr. The dilation site was examined following       endoscope reinsertion and showed no change. Estimated blood loss: none. Impression:               - Normal esophagus. Dilated.                           -  Subtly abnormal gastric and duodenal mucosa.                            Status post biopsy Moderate Sedation:      Moderate (conscious) sedation was personally administered by an       anesthesia professional. The following parameters were monitored: oxygen       saturation, heart rate, blood pressure, respiratory rate, EKG, adequacy       of pulmonary ventilation, and response to care. Recommendation:           - Patient has a contact number available for                            emergencies. The signs and symptoms of potential                            delayed complications were discussed with the                             patient. Return to normal activities tomorrow.                            Written discharge instructions were provided to the                            patient.                           - Advance diet as tolerated.                           - Continue present medications.                           - Return to my office in 2 months. See colonoscopy                            report. Procedure Code(s):        --- Professional ---                           [phone], Esophagogastroduodenoscopy, flexible,                            transoral; diagnostic, including collection of                            specimen(s) by brushing or washing, when performed                            (separate procedure)                           43450, Dilation of esophagus, by unguided sound or                            bougie, single or  multiple passes Diagnosis Code(s):        --- Professional ---                           R13.10, Dysphagia, unspecified CPT copyright 2022 American Medical Association. All rights reserved. The codes documented in this report are preliminary and upon coder review may  be revised to meet current compliance requirements. Gerrit Friends. Nykole Matos, MD Gennette Pac, MD 08/11/2022 10:20:32 AM This report has been signed electronically. Number of Addenda: 0

## 2022-08-11 NOTE — Anesthesia Procedure Notes (Signed)
Date/Time: 08/11/2022 10:04 AM  Performed by: Julian Reil, CRNAPre-anesthesia Checklist: Patient identified, Emergency Drugs available, Suction available and Patient being monitored Patient Re-evaluated:Patient Re-evaluated prior to induction Oxygen Delivery Method: Nasal cannula Induction Type: IV induction Placement Confirmation: positive ETCO2

## 2022-08-11 NOTE — Transfer of Care (Signed)
Immediate Anesthesia Transfer of Care Note  Patient: Lance [name]  Procedure(s) Performed: COLONOSCOPY WITH PROPOFOL ESOPHAGOGASTRODUODENOSCOPY (EGD) WITH PROPOFOL MALONEY DILATION BIOPSY POLYPECTOMY  Patient Location: Short Stay  Anesthesia Type:General  Level of Consciousness: drowsy  Airway & Oxygen Therapy: Patient Spontanous Breathing and Patient connected to nasal cannula oxygen  Post-op Assessment: Report given to RN and Post -op Vital signs reviewed and stable  Post vital signs: Reviewed and stable  Last Vitals:  Vitals Value Taken Time  BP    Temp    Pulse    Resp    SpO2      Last Pain:  Vitals:   08/11/22 0957  TempSrc:   PainSc: 0-No pain         Complications: No notable events documented.

## 2022-08-11 NOTE — Anesthesia Preprocedure Evaluation (Signed)
Anesthesia Evaluation  Patient identified by MRN, date of birth, ID band Patient awake    Reviewed: Allergy & Precautions, H&P , NPO status , Patient's Chart, lab work & pertinent test results, reviewed documented beta blocker date and time   Airway Mallampati: II  TM Distance: >3 FB Neck ROM: full    Dental no notable dental hx.    Pulmonary neg pulmonary ROS, shortness of breath, asthma , COPD, former smoker   Pulmonary exam normal breath sounds clear to auscultation       Cardiovascular Exercise Tolerance: Good hypertension, + angina  + CAD, + Past MI and +CHF  negative cardio ROS + pacemaker + Cardiac Defibrillator  Rhythm:regular Rate:Normal     Neuro/Psych  Headaches PSYCHIATRIC DISORDERS Anxiety Depression    TIA Neuromuscular disease CVA negative neurological ROS  negative psych ROS   GI/Hepatic negative GI ROS, Neg liver ROS,GERD  ,,  Endo/Other  negative endocrine ROSdiabetes    Renal/GU negative Renal ROS  negative genitourinary   Musculoskeletal   Abdominal   Peds  Hematology negative hematology ROS (+)   Anesthesia Other Findings   Reproductive/Obstetrics negative OB ROS                             Anesthesia Physical Anesthesia Plan  ASA: 3  Anesthesia Plan: General   Post-op Pain Management:    Induction:   PONV Risk Score and Plan: Propofol infusion  Airway Management Planned:   Additional Equipment:   Intra-op Plan:   Post-operative Plan:   Informed Consent: I have reviewed the patients History and Physical, chart, labs and discussed the procedure including the risks, benefits and alternatives for the proposed anesthesia with the patient or authorized representative who has indicated his/her understanding and acceptance.     Dental Advisory Given  Plan Discussed with: CRNA  Anesthesia Plan Comments:        Anesthesia Quick Evaluation

## 2022-08-12 LAB — SURGICAL PATHOLOGY

## 2022-08-13 LAB — NM MYOCAR MULTI W/SPECT W/WALL MOTION / EF
LV dias vol: 89 mL (ref 62–150)
LV sys vol: 59 mL
Nuc Stress EF: 33 %
Peak HR: 70 {beats}/min
RATE: 0.4
Rest HR: 62 {beats}/min
Rest Nuclear Isotope Dose: 15 mCi
SDS: 2
SRS: 9
SSS: 4
ST Depression (mm): 0 mm
Stress Nuclear Isotope Dose: 30.8 mCi
TID: 0.97

## 2022-08-13 NOTE — Anesthesia Postprocedure Evaluation (Signed)
Anesthesia Post Note  Patient: GARREY SIGLIN  Procedure(s) Performed: COLONOSCOPY WITH PROPOFOL ESOPHAGOGASTRODUODENOSCOPY (EGD) WITH PROPOFOL MALONEY DILATION BIOPSY POLYPECTOMY  Patient location during evaluation: Phase II Anesthesia Type: General Level of consciousness: awake Pain management: pain level controlled Vital Signs Assessment: post-procedure vital signs reviewed and stable Respiratory status: spontaneous breathing and respiratory function stable Cardiovascular status: blood pressure returned to baseline and stable Postop Assessment: no headache and no apparent nausea or vomiting Anesthetic complications: no Comments: Late entry   No notable events documented.   Last Vitals:  Vitals:   08/11/22 0900 08/11/22 1045  BP: 127/72 112/60  Pulse: 69 68  Resp: 15 15  Temp: 37.1 C 37.1 C  SpO2: 98% 99%    Last Pain:  Vitals:   08/11/22 1045  TempSrc: Oral  PainSc: 0-No pain                 Windell Norfolk

## 2022-08-16 ENCOUNTER — Ambulatory Visit: Payer: Medicare Other | Admitting: Urology

## 2022-08-16 ENCOUNTER — Encounter: Payer: Self-pay | Admitting: Urology

## 2022-08-16 VITALS — BP 111/68 | HR 88

## 2022-08-16 DIAGNOSIS — N529 Male erectile dysfunction, unspecified: Secondary | ICD-10-CM | POA: Diagnosis not present

## 2022-08-16 DIAGNOSIS — N401 Enlarged prostate with lower urinary tract symptoms: Secondary | ICD-10-CM

## 2022-08-16 DIAGNOSIS — R350 Frequency of micturition: Secondary | ICD-10-CM

## 2022-08-16 LAB — URINALYSIS, ROUTINE W REFLEX MICROSCOPIC
Bilirubin, UA: NEGATIVE
Glucose, UA: NEGATIVE
Ketones, UA: NEGATIVE
Leukocytes,UA: NEGATIVE
Nitrite, UA: NEGATIVE
Protein,UA: NEGATIVE
RBC, UA: NEGATIVE
Specific Gravity, UA: <1.005 — ABNORMAL LOW (ref 1.005–1.030)
Urobilinogen, Ur: 0.2 mg/dL (ref 0.2–1.0)
pH, UA: 5.5 (ref 5.0–7.5)

## 2022-08-16 MED ORDER — TADALAFIL 20 MG PO TABS
20.0000 mg | ORAL_TABLET | Freq: Every day | ORAL | 6 refills | Status: DC | PRN
Start: 2022-08-16 — End: 2023-07-11
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
## 2022-08-17 LAB — TESTOSTERONE: Testosterone: 242 ng/dL — ABNORMAL LOW (ref 264–916)

## 2022-08-17 LAB — PSA: Prostate Specific Ag, Serum: 2 ng/mL (ref 0.0–4.0)

## 2022-08-18 ENCOUNTER — Encounter (HOSPITAL_COMMUNITY): Payer: Self-pay | Admitting: Internal Medicine

## 2022-08-20 DIAGNOSIS — E1142 Type 2 diabetes mellitus with diabetic polyneuropathy: Secondary | ICD-10-CM | POA: Diagnosis not present

## 2022-08-20 DIAGNOSIS — I2510 Atherosclerotic heart disease of native coronary artery without angina pectoris: Secondary | ICD-10-CM | POA: Diagnosis not present

## 2022-08-20 DIAGNOSIS — Z794 Long term (current) use of insulin: Secondary | ICD-10-CM | POA: Diagnosis not present

## 2022-08-20 DIAGNOSIS — E1165 Type 2 diabetes mellitus with hyperglycemia: Secondary | ICD-10-CM | POA: Diagnosis not present

## 2022-08-20 LAB — HEMOGLOBIN A1C: Hemoglobin A1C: 10.7

## 2022-08-23 ENCOUNTER — Telehealth: Payer: Self-pay

## 2022-08-23 DIAGNOSIS — N529 Male erectile dysfunction, unspecified: Secondary | ICD-10-CM

## 2022-08-23 DIAGNOSIS — N401 Enlarged prostate with lower urinary tract symptoms: Secondary | ICD-10-CM

## 2022-08-23 DIAGNOSIS — R350 Frequency of micturition: Secondary | ICD-10-CM

## 2022-08-23 NOTE — Telephone Encounter (Signed)
Patient is aware that his testosterone is low and lab orders are in and appointment set for 07/22. Patient voiced understanding

## 2022-08-23 NOTE — Telephone Encounter (Signed)
-----   Message from Jerilee Field, MD sent at 08/18/2022  1:07 PM EDT ----- Let Jake Shark know his testosterone is low. Can you order a testosterone, bioavailable testosterone, LH, FSH, Prolactin for one morning before 10 AM? Thank you. Let patient know these are the confirmatory labs we need to get.   ----- Message ----- From: Troy Sine, CMA Sent: 08/17/2022   9:28 AM EDT To: Jerilee Field, MD  Please review

## 2022-08-26 ENCOUNTER — Encounter: Payer: Self-pay | Admitting: Internal Medicine

## 2022-08-27 ENCOUNTER — Ambulatory Visit: Payer: Medicare Other | Admitting: Allergy & Immunology

## 2022-09-02 ENCOUNTER — Other Ambulatory Visit: Payer: Self-pay | Admitting: Family Medicine

## 2022-09-15 DIAGNOSIS — M47892 Other spondylosis, cervical region: Secondary | ICD-10-CM | POA: Diagnosis not present

## 2022-09-15 DIAGNOSIS — M542 Cervicalgia: Secondary | ICD-10-CM | POA: Diagnosis not present

## 2022-09-16 ENCOUNTER — Ambulatory Visit (INDEPENDENT_AMBULATORY_CARE_PROVIDER_SITE_OTHER): Payer: Medicare Other

## 2022-09-16 ENCOUNTER — Ambulatory Visit: Payer: Medicare Other | Admitting: Cardiology

## 2022-09-16 DIAGNOSIS — I255 Ischemic cardiomyopathy: Secondary | ICD-10-CM

## 2022-09-16 LAB — CUP PACEART REMOTE DEVICE CHECK
Battery Remaining Longevity: 88 mo
Battery Remaining Percentage: 90 %
Battery Voltage: 3.1 V
Brady Statistic AP VP Percent: 1 %
Brady Statistic AP VS Percent: 1 %
Brady Statistic AS VP Percent: 1 %
Brady Statistic AS VS Percent: 99 %
Brady Statistic RA Percent Paced: 1 %
Brady Statistic RV Percent Paced: 1 %
Date Time Interrogation Session: 20240516025414
HighPow Impedance: 55 Ohm
HighPow Impedance: 55 Ohm
Implantable Lead Connection Status: 753985
Implantable Lead Connection Status: 753985
Implantable Lead Implant Date: 20050706
Implantable Lead Implant Date: 20161128
Implantable Lead Location: 753859
Implantable Lead Location: 753860
Implantable Lead Model: 7122
Implantable Pulse Generator Implant Date: 20230518
Lead Channel Impedance Value: 340 Ohm
Lead Channel Impedance Value: 380 Ohm
Lead Channel Pacing Threshold Amplitude: 0.75 V
Lead Channel Pacing Threshold Amplitude: 1 V
Lead Channel Pacing Threshold Pulse Width: 0.5 ms
Lead Channel Pacing Threshold Pulse Width: 0.5 ms
Lead Channel Sensing Intrinsic Amplitude: 3 mV
Lead Channel Sensing Intrinsic Amplitude: 8.9 mV
Lead Channel Setting Pacing Amplitude: 2 V
Lead Channel Setting Pacing Amplitude: 2.5 V
Lead Channel Setting Pacing Pulse Width: 0.5 ms
Lead Channel Setting Sensing Sensitivity: 0.5 mV
Pulse Gen Serial Number: 8942717

## 2022-09-17 ENCOUNTER — Other Ambulatory Visit: Payer: Self-pay | Admitting: Gastroenterology

## 2022-09-24 ENCOUNTER — Ambulatory Visit (INDEPENDENT_AMBULATORY_CARE_PROVIDER_SITE_OTHER): Payer: Medicare Other | Admitting: Family Medicine

## 2022-09-24 ENCOUNTER — Encounter: Payer: Self-pay | Admitting: Family Medicine

## 2022-09-24 VITALS — BP 122/70 | HR 69 | Temp 97.2°F | Ht 69.0 in | Wt 220.0 lb

## 2022-09-24 DIAGNOSIS — D692 Other nonthrombocytopenic purpura: Secondary | ICD-10-CM | POA: Diagnosis not present

## 2022-09-24 DIAGNOSIS — R413 Other amnesia: Secondary | ICD-10-CM

## 2022-09-24 DIAGNOSIS — E1142 Type 2 diabetes mellitus with diabetic polyneuropathy: Secondary | ICD-10-CM

## 2022-09-24 DIAGNOSIS — E785 Hyperlipidemia, unspecified: Secondary | ICD-10-CM | POA: Diagnosis not present

## 2022-09-24 DIAGNOSIS — I10 Essential (primary) hypertension: Secondary | ICD-10-CM

## 2022-09-24 MED ORDER — DONEPEZIL HCL 10 MG PO TABS: 10.0000 mg | ORAL_TABLET | Freq: Every day | ORAL | 3 refills | Status: DC

## 2022-09-24 MED ORDER — MEMANTINE HCL 10 MG PO TABS: ORAL_TABLET | ORAL | 5 refills | Status: DC

## 2022-09-24 NOTE — Progress Notes (Signed)
   Subjective:    Patient ID: Lance [name], male    DOB: Apr 03, 1946, 77 y.o.   MRN: [id number]  HPI Patient is here for a 3 month follow up on his chronic medical issues  Patient is having some memory problems forgetting at work and at home - was advised to cut 10 mg Aricept in half previously  States his right arm has been bleeding when bumped , bruising on both arms   Review of Systems     Objective:   Physical Exam General-in no acute distress Eyes-no discharge Lungs-respiratory rate normal, CTA CV-no murmurs,RRR Extremities skin warm dry no edema Neuro grossly normal Behavior normal, alert Senile purpura noted       Assessment & Plan:  1. Essential hypertension HTN- patient seen for follow-up regarding HTN.   Diet, medication compliance, appropriate labs and refills were completed.   Importance of keeping blood pressure under good control to lessen the risk of complications discussed Regular follow-up visits discussed   2. 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 Dyslipidemia He gets this through his endocrinologist continue current measures  4. Diabetic peripheral neuropathy (HCC) He is on pain medication and follows with a specialist for that  5. Senile purpura (HCC) Stable thrombocytopenia previously stable  Patient also with diabetes followed with the specialist we will try to get most recent labs from them  Patient states the urologist gave him a prescription for Cialis to help him with his energy I am not quite sure how that works-I believe the patient may have misunderstood-his medication is for erectile dysfunction but he states he does not need that currently  His  testosterone slightly low but I really doubt that that is causing his fatigue tiredness I think it is combination of age and chronic health issues

## 2022-09-28 ENCOUNTER — Other Ambulatory Visit: Payer: Self-pay | Admitting: Family Medicine

## 2022-09-30 NOTE — Progress Notes (Signed)
Remote ICD transmission.   

## 2022-10-05 ENCOUNTER — Ambulatory Visit: Payer: Medicare Other | Admitting: Gastroenterology

## 2022-10-05 ENCOUNTER — Ambulatory Visit: Payer: Medicare Other | Admitting: Internal Medicine

## 2022-10-05 ENCOUNTER — Encounter: Payer: Self-pay | Admitting: Gastroenterology

## 2022-10-05 VITALS — BP 159/85 | HR 78 | Temp 97.7°F | Ht 69.0 in | Wt 225.2 lb

## 2022-10-05 DIAGNOSIS — R1319 Other dysphagia: Secondary | ICD-10-CM | POA: Diagnosis not present

## 2022-10-05 DIAGNOSIS — K219 Gastro-esophageal reflux disease without esophagitis: Secondary | ICD-10-CM

## 2022-10-05 NOTE — Patient Instructions (Addendum)
Continue pantoprazole once to twice daily. Call if you have recurrent abdominal pain, loose stools, or difficulty swallowing.  Return office visit in 2 years.

## 2022-10-05 NOTE — Progress Notes (Signed)
GI Office Note    Referring Provider: Babs Sciara, MD Primary Care Physician:  Babs Sciara, MD  Primary Gastroenterologist: Roetta Sessions, MD   Chief Complaint   Chief Complaint  Patient presents with   Follow-up    Doing well    History of Present Illness   Lance [name] is a 77 y.o. male presenting today for follow up. Last seen in 07/2022.   EGD April 2024: Esophagus status post dilation.  Subtly abnormality of the gastric and duodenal mucosa.  Biopsy duodenum with no specific histologic disease.  Gastric biopsies with no H. pylori.  Colonoscopy April 2024: One 5 mm polyp in the mid descending colon, removed.  Status post segmental biopsy.  Elongated and redundant colon.  Random colon biopsies negative.  Tubular adenoma removed.  Consider colonoscopy in 7 years.     Doing well. Heartburn controlled on pantoprazole daily. Dysphagia resolved s/p dilation. No abdominal pain. One episode of diarrhea since last ov. Stool studies were negative.   Medications   Current Outpatient Medications  Medication Sig Dispense Refill   albuterol (VENTOLIN HFA) 108 (90 Base) MCG/ACT inhaler Inhale 2 puffs into the lungs every 6 (six) hours as needed for wheezing or shortness of breath. 8 g 2   alfuzosin (UROXATRAL) 10 MG 24 hr tablet Take 1 tablet (10 mg total) by mouth daily. 30 tablet 11   ALPRAZolam (XANAX) 0.5 MG tablet TAKE ONE TABLET BY MOUTH TWICE A DAY 60 tablet 2   aspirin EC 81 MG tablet Take 81 mg by mouth daily.     Cholecalciferol 125 MCG (5000 UT) TABS Take 5,000 Units by mouth daily.     clopidogrel (PLAVIX) 75 MG tablet TAKE ONE (1) TABLET BY MOUTH EVERY DAY 90 tablet 1   Coenzyme Q10-Vitamin E (QUNOL ULTRA COQ10 PO) Take 1 capsule by mouth daily.     diphenoxylate-atropine (LOMOTIL) 2.5-0.025 MG tablet 1 every 6 hours as needed diarrhea 16 tablet 0   donepezil (ARICEPT) 10 MG tablet Take 1 tablet (10 mg total) by mouth at bedtime. 90 tablet 3   ezetimibe (ZETIA)  10 MG tablet Take 1 tablet (10 mg total) by mouth daily. 90 tablet 3   FARXIGA 10 MG TABS tablet Take 10 mg by mouth daily.     fexofenadine (ALLEGRA) 180 MG tablet Take 180 mg by mouth daily.     HUMALOG KWIKPEN 100 UNIT/ML KiwkPen Inject 0.32-0.38 mLs (32-38 Units total) into the skin 3 (three) times daily. (Patient taking differently: Inject 25 Units into the skin 3 (three) times daily before meals.) 30 pen 2   HYDROcodone-acetaminophen (NORCO) 7.5-325 MG tablet Take 0.5-1 tablets by mouth every 12 (twelve) hours as needed for moderate pain.     Insulin Detemir (LEVEMIR FLEXTOUCH) 100 UNIT/ML Pen Inject 90 Units into the skin daily at 10 pm. (Patient taking differently: Inject 100 Units into the skin at bedtime.) 30 pen 2   Insulin Pen Needle (B-D ULTRAFINE III SHORT PEN) 31G X 8 MM MISC USE 1 NEEDLE 4 TIMES DAILY AS DIRECTED     losartan (COZAAR) 25 MG tablet Take 25 mg by mouth daily.     memantine (NAMENDA) 10 MG tablet TAKE ONE TABLET BY MOUTH TWOTIMES DAILY 60 tablet 5   metoprolol succinate (TOPROL XL) 25 MG 24 hr tablet Take 0.5 tablets (12.5 mg total) by mouth daily. 45 tablet 1   montelukast (SINGULAIR) 10 MG tablet TAKE 1 TABLET BY MOUTH AT  BEDTIME 100 tablet 2   MYRBETRIQ 25 MG TB24 tablet TAKE ONE TABLET (25MG  TOTAL) BY MOUTH DAILY 30 tablet 11   nitroGLYCERIN (NITROSTAT) 0.4 MG SL tablet PLACE ONE TABLET (0.4MG  TOTAL) UNDER THETONGUE EVERY FIVE MINUTES AS NEEDED FOR CHEST PAIN 25 tablet 3   ONETOUCH ULTRA test strip USE 1 STRIP TO CHECK GLUCOSE 4 TIMES DAILY     pantoprazole (PROTONIX) 40 MG tablet TAKE ONE TABLET BY MOUTH ONCE DAILY, 30 MINUTES BEFORE BREAKFAST. MAY TAKE A SECOND DOSE 30 MINUTES BEFORE DINNER IF NEEDED. 180 tablet 3   Probiotic Product (ALIGN PO) Take 1 capsule by mouth daily.     rosuvastatin (CRESTOR) 40 MG tablet TAKE ONE (1) TABLET BY MOUTH EVERY DAY 90 tablet 1   sertraline (ZOLOFT) 50 MG tablet TAKE ONE (1) TABLET BY MOUTH EVERY DAY 30 tablet 3   tadalafil  (CIALIS) 20 MG tablet Take 1 tablet (20 mg total) by mouth daily as needed for erectile dysfunction. 10 tablet 6   Tiotropium Bromide-Olodaterol (STIOLTO RESPIMAT) 2.5-2.5 MCG/ACT AERS Inhale 2 puffs into the lungs daily. (Patient taking differently: Inhale 2 puffs into the lungs daily as needed (shortness of breath).) 4 g 1   vitamin B-12 (CYANOCOBALAMIN) 1000 MCG tablet Take 6,000 mcg by mouth daily.     No current facility-administered medications for this visit.    Allergies   Allergies as of 10/05/2022 - Review Complete 10/05/2022  Allergen Reaction Noted   Morphine and codeine Other (See Comments) 02/08/2012   Penicillins Hives    Percocet [oxycodone-acetaminophen] Other (See Comments) 02/08/2012   Latex Rash 05/13/2014   Levaquin [levofloxacin in d5w] Itching 04/12/2013   Metformin and related Other (See Comments) 04/11/2015   Tape Rash 05/08/2015     Review of Systems   General: Negative for anorexia, weight loss, fever, chills, fatigue, weakness. ENT: Negative for hoarseness, difficulty swallowing , nasal congestion. CV: Negative for chest pain, angina, palpitations, dyspnea on exertion, peripheral edema.  Respiratory: Negative for dyspnea at rest, dyspnea on exertion, cough, sputum, wheezing.  GI: See history of present illness. GU:  Negative for dysuria, hematuria, urinary incontinence, urinary frequency, nocturnal urination.  Endo: Negative for unusual weight change.     Physical Exam   BP (!) 149/75 (BP Location: Right Arm, Patient Position: Sitting, Cuff Size: Large)   Pulse 70   Temp 97.7 F (36.5 C) (Oral)   Ht 5\' 9"  (1.753 m)   Wt 225 lb 3.2 oz (102.2 kg)   SpO2 96%   BMI 33.26 kg/m    General: Well-nourished, well-developed in no acute distress.  Eyes: No icterus. Mouth: Oropharyngeal mucosa moist and pink   Abdomen: Bowel sounds are normal, nontender, nondistended, no hepatosplenomegaly or masses,  no abdominal bruits or hernia , no rebound or  guarding.  Rectal: not performed Extremities: No lower extremity edema. No clubbing or deformities. Neuro: Alert and oriented x 4   Skin: Warm and dry, no jaundice.   Psych: Alert and cooperative, normal mood and affect.  Labs   Lab Results  Component Value Date   CREATININE 0.94 08/06/2022   BUN 15 08/06/2022   NA 136 08/06/2022   K 3.9 08/06/2022   CL 106 08/06/2022   CO2 23 08/06/2022   Lab Results  Component Value Date   WBC 4.5 06/07/2022   HGB 12.9 (L) 06/07/2022   HCT 39.1 06/07/2022   MCV 88.3 06/07/2022   PLT 120 (L) 06/07/2022   Lab Results  Component Value Date  ALT 30 06/07/2022   AST 32 06/07/2022   ALKPHOS 49 06/07/2022   BILITOT 0.6 06/07/2022    Imaging Studies   CUP PACEART REMOTE DEVICE CHECK  Result Date: 09/16/2022 Scheduled remote reviewed. Normal device function.  5 AMS, 4-22sec in duration HF diagnostics recently abnormal Next remote 91 days. LA, CVRS   Assessment   GERD/dysphagia: doing well at this time.   Diarrhea: resolved.  The patient was found to have elevated blood pressure when vital signs were checked in the office. The blood pressure was rechecked by the nursing staff and it was found be persistently elevated >140/90 mmHg. I personally advised to the patient to follow up closely with his PCP for hypertension control.   PLAN   Continue pantoprazole once to twice daily. Call if recurrent abd pain, loose stools.  Return ov in 2 years.    Leanna Battles. Melvyn Neth, MHS, PA-C [facility]

## 2022-10-07 ENCOUNTER — Encounter: Payer: Self-pay | Admitting: Gastroenterology

## 2022-10-21 ENCOUNTER — Other Ambulatory Visit: Payer: Self-pay | Admitting: Family Medicine

## 2022-10-28 ENCOUNTER — Encounter: Payer: Self-pay | Admitting: Nurse Practitioner

## 2022-10-28 ENCOUNTER — Ambulatory Visit: Payer: Medicare Other | Attending: Nurse Practitioner | Admitting: Nurse Practitioner

## 2022-10-28 VITALS — BP 122/72 | HR 71 | Ht 69.0 in | Wt 220.6 lb

## 2022-10-28 DIAGNOSIS — I2510 Atherosclerotic heart disease of native coronary artery without angina pectoris: Secondary | ICD-10-CM | POA: Diagnosis not present

## 2022-10-28 DIAGNOSIS — E785 Hyperlipidemia, unspecified: Secondary | ICD-10-CM

## 2022-10-28 DIAGNOSIS — Z8674 Personal history of sudden cardiac arrest: Secondary | ICD-10-CM | POA: Diagnosis not present

## 2022-10-28 DIAGNOSIS — I10 Essential (primary) hypertension: Secondary | ICD-10-CM | POA: Diagnosis not present

## 2022-10-28 DIAGNOSIS — Z95810 Presence of automatic (implantable) cardiac defibrillator: Secondary | ICD-10-CM

## 2022-10-28 DIAGNOSIS — G459 Transient cerebral ischemic attack, unspecified: Secondary | ICD-10-CM | POA: Diagnosis not present

## 2022-10-28 DIAGNOSIS — I951 Orthostatic hypotension: Secondary | ICD-10-CM

## 2022-10-28 DIAGNOSIS — Z8679 Personal history of other diseases of the circulatory system: Secondary | ICD-10-CM | POA: Diagnosis not present

## 2022-10-28 NOTE — Progress Notes (Signed)
Cardiology Office Note:  .   Date:  10/28/2022 ID:  Lance [name], DOB [birth year], MRN [id number] PCP: Babs Sciara, MD  [facility] Providers Cardiologist:  Dina Rich, MD Electrophysiologist:  Lewayne Bunting, MD    History of Present Illness: .   Lance [name] is a 77 y.o. male with a PMH of CAD, ectatic aorta, hyperlipidemia, history of TIA (indefinite DAPT), history of SCD, s/p ICD (follows Dr. Ladona Ridgel), hx of ICM, hypertension, former smoker, fatigue, dysphagia, memory loss/early stages of dementia, who presents today for scheduled follow-up.    History of remote stenting.  Underwent CABG x 4 in 2013 (SVG-diagonal, SVG-OM, LIMA-LAD, SVG-PDA).  Echo in 2015 revealed EF mildly reduced at 45 to 50%, grade 1 DD.  Myoview in 2015 revealed inferolateral scar, negative for ischemia, EF 52%.  In 2016, received DES to SVG-PDA, also received DES to [facility] in 2018.  RHC revealed mean PA pressure 16, PCWP 13, CI 2.4. Underwent repeat cath for recurrent subtotal.  Cath revealed stable anatomy with severe three-vessel obstructive disease, patent grafts however, occluded SVG-RCA.  90% stenosis of RPDA that was chronic.  RHC revealed PA 16, CI 2.4, PCWP 13.  Norvasc was stopped due to orthostatic dizziness and Imdur was previously stopped due to headaches.   In 2020, underwent heart cath that revealed occluded left circumflex, ostial LAD, and mid RCA 75%.  RPDA 90% chronic.  Patent grafts with occluded SVG-RPDA.  RCA was ballooned.  LVEDP was at 14 and was recommended for DAPT for at least 1 month.  There was no change in his breathing after procedure.  EF in 2022 was 50 to 55%.  NST negative for ischemia.   He is s/p VF arrest and s/p ICD. Followed by Dr. Ladona Ridgel.  Last seen by Dr. Ladona Ridgel in December 24, 2021. His defibrillator was working normally.    Today he presents for follow-up.  He is doing well since he had his colonoscopy in April.  Denies any acute cardiac complaints or issues. Denies any  chest pain, shortness of breath, palpitations, syncope, presyncope, dizziness, orthopnea, PND, swelling or significant weight changes, acute bleeding, or claudication.  SH: Owns 5 dogs.  Studies Reviewed: Marland Kitchen       Lexiscan 08/13/2022:   The study is normal. There are no perfusion defects consistent with prior infarct or current ischemia.  The study is high risk. Risk is based solely on low ejection fraction, there is no current myocardium at jeopardy. Consider correlating LVEF with echo.   No ST deviation was noted.   LV perfusion is normal.   Left ventricular function is abnormal. Nuclear stress EF: 33 %. The left ventricular ejection fraction is moderately decreased (30-44%). End diastolic cavity size is normal.   There is a moderate size mild to moderate intensity inferior wall defect most intense in the resting images most consistent with subdiaphragmatic attenuation.  Carotid duplex 07/2022: 1 to 39% ICA stenosis bilaterally.  Echo 05/2022: 1. Left ventricular ejection fraction, by estimation, is 50 to 55%. The  left ventricle has low normal function. Left ventricular endocardial  border not optimally defined to evaluate regional wall motion. Left  ventricular diastolic parameters are  indeterminate. The average left ventricular global longitudinal strain is  -19.7 %. The global longitudinal strain is normal.   2. Right ventricular systolic function was not well visualized. The right  ventricular size is not well visualized.   3. Left atrial size was mildly dilated.   4. The  mitral valve is abnormal. Mild mitral valve regurgitation. No  evidence of mitral stenosis.   5. The aortic valve is tricuspid. There is mild calcification of the  aortic valve. Aortic valve regurgitation is not visualized. No aortic  stenosis is present.   6. The inferior vena cava is normal in size with greater than 50%  respiratory variability, suggesting right atrial pressure of 3 mmHg.   Comparison(s): No  significant change from prior study.  Physical Exam:   VS:  BP 122/72   Pulse 71   Ht 5\' 9"  (1.753 m)   Wt 220 lb 9.6 oz (100.1 kg)   SpO2 95%   BMI 32.58 kg/m    Wt Readings from Last 3 Encounters:  10/28/22 220 lb 9.6 oz (100.1 kg)  10/05/22 225 lb 3.2 oz (102.2 kg)  09/24/22 220 lb (99.8 kg)    GEN: Well nourished, well developed in no acute distress NECK: No JVD; No carotid bruits CARDIAC: S1/S2, RRR, no murmurs, rubs, gallops RESPIRATORY:  Clear to auscultation without rales, wheezing or rhonchi  ABDOMEN: Soft, non-tender, non-distended EXTREMITIES:  No edema; No deformity   ASSESSMENT AND PLAN: .     1. CAD Stable with no anginal symptoms. No indication for ischemic evaluation.  Hx of CABG and stenting and ballooning. Recent NST intermediate risk d/t low EF seen during study. Past Echo unremarkable. Will update limited Echo to evaluate EF as mentioned below. Continue aspirin, Plavix, Zetia, losartan, Toprol XL, Crestor, and nitroglycerin as needed. Heart healthy diet recommended and increasing physical activity as tolerated encouraged. ED precautions discussed.   2. Hx of ischemic cardiomyopathy Recent NST revealed EF approximately 33%, deemed NST intermediate risk.  Echocardiogram 05/2022 revealed EF 50 to 55%, will update limited echocardiogram at this time.  No medication changes at this time. Low sodium diet, fluid restriction <2L, and daily weights encouraged. Educated to contact our office for weight gain of 2 lbs overnight or 5 lbs in one week.  3. HTN  BP stable today. Given BP log today and discussed to bring this to follow-up visit.  Discussed to monitor BP at home at least 2 hours after medications and sitting for 5-10 minutes. No med changes. Heart healthy diet and regular cardiovascular exercise encouraged.    HLD Lipid panel from July 2023 revealed total cholesterol 157, triglycerides 95, LDL 98, and HDL 41. Due for repeat labs, will arrange FLP and LFT in 2-3  weeks. Continue current medication regimen. Heart healthy diet and increasing physical activity as tolerated encouraged.    Hx of sudden cardiac death, s/p ICD Followed by Dr. Ladona Ridgel.  Recent remote device check revealed normal device function.  No changes were recommended.  Continue to follow-up with EP. Heart healthy diet and regular cardiovascular exercise as tolerated encouraged.    6. Orthostatic hypotension Denies any recent symptoms. Past orthostatics positive for drop while going from supine to sitting, unable to complete d/t weakness.  Previously discussed conservative measures.  Heart healthy diet recommended. Continue to f/u with Ortho and Neurologist.    7. Hx of TIA Denies any strokelike symptoms.  Continue current medication regimen. Continue to follow with PCP and Neuro. Heart healthy diet and regular cardiovascular exercise as tolerated encouraged.   Dispo: Follow-up with Dr. Dina Rich or APP in 6 months or sooner if anything changes.  Signed, Sharlene Dory, NP

## 2022-10-28 NOTE — Patient Instructions (Addendum)
Medication Instructions:  Your physician recommends that you continue on your current medications as directed. Please refer to the Current Medication list given to you today.  Labwork: In 2-3 weeks FLP/LFT labs  Testing/Procedures: Your physician has requested that you have an echocardiogram. Echocardiography is a painless test that uses sound waves to create images of your heart. It provides your doctor with information about the size and shape of your heart and how well your heart's chambers and valves are working. This procedure takes approximately one hour. There are no restrictions for this procedure. Please do NOT wear cologne, perfume, aftershave, or lotions (deodorant is allowed). Please arrive 15 minutes prior to your appointment time.  Follow-Up: Your physician recommends that you schedule a follow-up appointment in: 6 Months with Dr.Branch  Any Other Special Instructions Will Be Listed Below (If Applicable).  If you need a refill on your cardiac medications before your next appointment, please call your pharmacy.

## 2022-11-05 DIAGNOSIS — R0609 Other forms of dyspnea: Secondary | ICD-10-CM | POA: Diagnosis not present

## 2022-11-05 DIAGNOSIS — E785 Hyperlipidemia, unspecified: Secondary | ICD-10-CM | POA: Diagnosis not present

## 2022-11-05 DIAGNOSIS — I2510 Atherosclerotic heart disease of native coronary artery without angina pectoris: Secondary | ICD-10-CM | POA: Diagnosis not present

## 2022-11-05 DIAGNOSIS — I25119 Atherosclerotic heart disease of native coronary artery with unspecified angina pectoris: Secondary | ICD-10-CM | POA: Diagnosis not present

## 2022-11-05 DIAGNOSIS — I509 Heart failure, unspecified: Secondary | ICD-10-CM | POA: Diagnosis not present

## 2022-11-05 DIAGNOSIS — E1159 Type 2 diabetes mellitus with other circulatory complications: Secondary | ICD-10-CM | POA: Diagnosis not present

## 2022-11-06 LAB — LIPID PANEL
Chol/HDL Ratio: 2.3 ratio (ref 0.0–5.0)
Cholesterol, Total: 95 mg/dL — ABNORMAL LOW (ref 100–199)
HDL: 42 mg/dL (ref >39)
LDL Chol Calc (NIH): 40 mg/dL (ref 0–99)
Triglycerides: 57 mg/dL (ref 0–149)
VLDL Cholesterol Cal: 13 mg/dL (ref 5–40)

## 2022-11-06 LAB — HEPATIC FUNCTION PANEL
ALT: 39 IU/L (ref 0–44)
AST: 30 IU/L (ref 0–40)
Albumin: 3.8 g/dL (ref 3.8–4.8)
Alkaline Phosphatase: 62 IU/L (ref 44–121)
Bilirubin Total: 0.2 mg/dL (ref 0.0–1.2)
Bilirubin, Direct: <0.1 mg/dL (ref 0.00–0.40)
Total Protein: 5.4 g/dL — ABNORMAL LOW (ref 6.0–8.5)

## 2022-11-09 DIAGNOSIS — I2510 Atherosclerotic heart disease of native coronary artery without angina pectoris: Secondary | ICD-10-CM | POA: Diagnosis not present

## 2022-11-09 DIAGNOSIS — E1142 Type 2 diabetes mellitus with diabetic polyneuropathy: Secondary | ICD-10-CM | POA: Diagnosis not present

## 2022-11-09 DIAGNOSIS — Z794 Long term (current) use of insulin: Secondary | ICD-10-CM | POA: Diagnosis not present

## 2022-11-09 DIAGNOSIS — E1165 Type 2 diabetes mellitus with hyperglycemia: Secondary | ICD-10-CM | POA: Diagnosis not present

## 2022-11-09 LAB — MICROALBUMIN, URINE: Microalb, Ur: 2.41

## 2022-11-09 LAB — PROTEIN / CREATININE RATIO, URINE: Creatinine, Urine: 128

## 2022-11-09 LAB — HEMOGLOBIN A1C: Hemoglobin A1C: 9.7

## 2022-11-09 LAB — MICROALBUMIN / CREATININE URINE RATIO: Microalb Creat Ratio: 18.9

## 2022-11-10 ENCOUNTER — Encounter: Payer: Self-pay | Admitting: *Deleted

## 2022-11-10 DIAGNOSIS — M542 Cervicalgia: Secondary | ICD-10-CM | POA: Diagnosis not present

## 2022-11-10 DIAGNOSIS — M47892 Other spondylosis, cervical region: Secondary | ICD-10-CM | POA: Diagnosis not present

## 2022-11-10 DIAGNOSIS — Z79891 Long term (current) use of opiate analgesic: Secondary | ICD-10-CM | POA: Diagnosis not present

## 2022-11-10 DIAGNOSIS — Z79899 Other long term (current) drug therapy: Secondary | ICD-10-CM | POA: Diagnosis not present

## 2022-11-10 DIAGNOSIS — G894 Chronic pain syndrome: Secondary | ICD-10-CM | POA: Diagnosis not present

## 2022-11-22 ENCOUNTER — Other Ambulatory Visit: Payer: Medicare Other

## 2022-11-22 NOTE — Addendum Note (Signed)
Addended by: Grier Rocher on: 11/22/2022 08:48 AM   Modules accepted: Orders

## 2022-11-22 NOTE — Telephone Encounter (Signed)
Testosterone Free and total labs re printed, first order was under [facility].

## 2022-11-24 DIAGNOSIS — H47012 Ischemic optic neuropathy, left eye: Secondary | ICD-10-CM | POA: Diagnosis not present

## 2022-11-24 DIAGNOSIS — H31093 Other chorioretinal scars, bilateral: Secondary | ICD-10-CM | POA: Diagnosis not present

## 2022-11-24 DIAGNOSIS — E133593 Other specified diabetes mellitus with proliferative diabetic retinopathy without macular edema, bilateral: Secondary | ICD-10-CM | POA: Diagnosis not present

## 2022-11-29 ENCOUNTER — Ambulatory Visit (INDEPENDENT_AMBULATORY_CARE_PROVIDER_SITE_OTHER): Payer: Medicare Other | Admitting: Family Medicine

## 2022-11-29 VITALS — BP 161/79 | HR 88 | Temp 97.5°F | Ht 69.0 in | Wt 223.0 lb

## 2022-11-29 DIAGNOSIS — R58 Hemorrhage, not elsewhere classified: Secondary | ICD-10-CM | POA: Diagnosis not present

## 2022-11-29 NOTE — Progress Notes (Signed)
   Subjective:    Patient ID: Lance [name], male    DOB: [birth year], 77 y.o.   MRN: [id number]  HPI  Free bleeding episodes from right side buttock and from left arm swatting a mosquito off- was bale stop bleeding with pressure and cold compress He relates with current anticoagulants he is having frequent bleeding from scab with small nicks on his skin or other problems he states his eye doctor told him there could be something wrong with his liver  Review of Systems     Objective:   Physical Exam Lungs clear heart regular pulse normal extremities no edema       Assessment & Plan:  Free bleeding more than likely related to aspirin along with Plavix Recommend stop aspirin currently We will look at platelets as well as his INR Hold off on any other type of intervention currently

## 2022-12-06 ENCOUNTER — Encounter: Payer: Self-pay | Admitting: Urology

## 2022-12-06 ENCOUNTER — Ambulatory Visit: Payer: Medicare Other | Admitting: Urology

## 2022-12-06 VITALS — BP 165/80 | HR 76

## 2022-12-06 DIAGNOSIS — E291 Testicular hypofunction: Secondary | ICD-10-CM

## 2022-12-06 DIAGNOSIS — N401 Enlarged prostate with lower urinary tract symptoms: Secondary | ICD-10-CM

## 2022-12-06 DIAGNOSIS — N529 Male erectile dysfunction, unspecified: Secondary | ICD-10-CM | POA: Diagnosis not present

## 2022-12-06 DIAGNOSIS — R350 Frequency of micturition: Secondary | ICD-10-CM

## 2022-12-06 DIAGNOSIS — R7989 Other specified abnormal findings of blood chemistry: Secondary | ICD-10-CM

## 2022-12-06 LAB — URINALYSIS, ROUTINE W REFLEX MICROSCOPIC
Bilirubin, UA: NEGATIVE
Ketones, UA: NEGATIVE
Leukocytes,UA: NEGATIVE
Nitrite, UA: NEGATIVE
Protein,UA: NEGATIVE
RBC, UA: NEGATIVE
Specific Gravity, UA: 1.025 (ref 1.005–1.030)
Urobilinogen, Ur: 0.2 mg/dL (ref 0.2–1.0)
pH, UA: 6 (ref 5.0–7.5)
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
## 2022-12-16 ENCOUNTER — Ambulatory Visit: Payer: Medicare Other

## 2022-12-16 DIAGNOSIS — Z8679 Personal history of other diseases of the circulatory system: Secondary | ICD-10-CM | POA: Diagnosis not present

## 2022-12-16 LAB — CUP PACEART REMOTE DEVICE CHECK
Battery Remaining Longevity: 86 mo
Battery Remaining Percentage: 87 %
Battery Voltage: 3.05 V
Brady Statistic AP VP Percent: 1 %
Brady Statistic AP VS Percent: 1 %
Brady Statistic AS VP Percent: 1 %
Brady Statistic AS VS Percent: 99 %
Brady Statistic RA Percent Paced: 1 %
Brady Statistic RV Percent Paced: 1 %
Date Time Interrogation Session: 20240815032943
HighPow Impedance: 56 Ohm
HighPow Impedance: 56 Ohm
Implantable Lead Connection Status: 753985
Implantable Lead Connection Status: 753985
Implantable Lead Implant Date: 20050706
Implantable Lead Implant Date: 20161128
Implantable Lead Location: 753859
Implantable Lead Location: 753860
Implantable Lead Model: 7122
Implantable Pulse Generator Implant Date: 20230518
Lead Channel Impedance Value: 350 Ohm
Lead Channel Impedance Value: 380 Ohm
Lead Channel Pacing Threshold Amplitude: 0.75 V
Lead Channel Pacing Threshold Amplitude: 1 V
Lead Channel Pacing Threshold Pulse Width: 0.5 ms
Lead Channel Pacing Threshold Pulse Width: 0.5 ms
Lead Channel Sensing Intrinsic Amplitude: 2.2 mV
Lead Channel Sensing Intrinsic Amplitude: 7.6 mV
Lead Channel Setting Pacing Amplitude: 2 V
Lead Channel Setting Pacing Amplitude: 2.5 V
Lead Channel Setting Pacing Pulse Width: 0.5 ms
Lead Channel Setting Sensing Sensitivity: 0.5 mV
Pulse Gen Serial Number: 8942717

## 2022-12-17 ENCOUNTER — Other Ambulatory Visit: Payer: Self-pay | Admitting: Family Medicine

## 2022-12-20 ENCOUNTER — Telehealth: Payer: Self-pay

## 2022-12-20 DIAGNOSIS — R7989 Other specified abnormal findings of blood chemistry: Secondary | ICD-10-CM

## 2022-12-20 MED ORDER — CLOMIPHENE CITRATE 50 MG PO TABS
50.0000 mg | ORAL_TABLET | Freq: Every day | ORAL | 3 refills | Status: AC
Start: 2022-12-20 — End: ?

## 2022-12-20 NOTE — Telephone Encounter (Signed)
Patient is made aware of Dr. Eskridge recommendation and voiced understanding.  

## 2022-12-20 NOTE — Telephone Encounter (Signed)
-----   Message from Jerilee Field sent at 12/20/2022  9:40 AM EDT ----- Please let Lance [name] know his testosterone is confirmed to be low. I sent a medicine called clomiphene to his pharmacy. He needs to take 1/2 a tablet (25 mg ) daily and recheck his T level in 1 month. T ordered - thanks!

## 2022-12-28 NOTE — Progress Notes (Signed)
Remote ICD transmission.   

## 2022-12-30 ENCOUNTER — Other Ambulatory Visit: Payer: Self-pay | Admitting: Cardiology

## 2022-12-30 ENCOUNTER — Other Ambulatory Visit: Payer: Self-pay | Admitting: Nurse Practitioner

## 2023-01-06 DIAGNOSIS — M542 Cervicalgia: Secondary | ICD-10-CM | POA: Diagnosis not present

## 2023-01-06 DIAGNOSIS — M791 Myalgia, unspecified site: Secondary | ICD-10-CM | POA: Diagnosis not present

## 2023-01-06 DIAGNOSIS — M47892 Other spondylosis, cervical region: Secondary | ICD-10-CM | POA: Diagnosis not present

## 2023-01-11 DIAGNOSIS — S0502XA Injury of conjunctiva and corneal abrasion without foreign body, left eye, initial encounter: Secondary | ICD-10-CM | POA: Diagnosis not present

## 2023-01-20 ENCOUNTER — Other Ambulatory Visit: Payer: Medicare Other

## 2023-01-20 DIAGNOSIS — R350 Frequency of micturition: Secondary | ICD-10-CM | POA: Diagnosis not present

## 2023-01-20 DIAGNOSIS — H16223 Keratoconjunctivitis sicca, not specified as Sjogren's, bilateral: Secondary | ICD-10-CM | POA: Diagnosis not present

## 2023-01-20 DIAGNOSIS — R7989 Other specified abnormal findings of blood chemistry: Secondary | ICD-10-CM | POA: Diagnosis not present

## 2023-01-20 DIAGNOSIS — S0502XD Injury of conjunctiva and corneal abrasion without foreign body, left eye, subsequent encounter: Secondary | ICD-10-CM | POA: Diagnosis not present

## 2023-01-23 LAB — TESTOSTERONE,FREE AND TOTAL
Testosterone, Free: 3.2 pg/mL — ABNORMAL LOW (ref 6.6–18.1)
Testosterone: 137 ng/dL — ABNORMAL LOW (ref 264–916)

## 2023-01-25 ENCOUNTER — Ambulatory Visit: Payer: Medicare Other | Admitting: Family Medicine

## 2023-01-25 VITALS — BP 138/86 | HR 70 | Temp 97.3°F | Ht 69.0 in | Wt 218.0 lb

## 2023-01-25 DIAGNOSIS — I10 Essential (primary) hypertension: Secondary | ICD-10-CM

## 2023-01-25 DIAGNOSIS — R5383 Other fatigue: Secondary | ICD-10-CM | POA: Diagnosis not present

## 2023-01-25 DIAGNOSIS — R0609 Other forms of dyspnea: Secondary | ICD-10-CM | POA: Diagnosis not present

## 2023-01-25 DIAGNOSIS — Z23 Encounter for immunization: Secondary | ICD-10-CM
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
## 2023-01-26 ENCOUNTER — Ambulatory Visit (HOSPITAL_COMMUNITY)
Admission: RE | Admit: 2023-01-26 | Discharge: 2023-01-26 | Disposition: A | Discharge status: Home / Self Care | Payer: Medicare Other | Source: Ambulatory Visit | Attending: Family Medicine | Admitting: Family Medicine

## 2023-01-26 DIAGNOSIS — R0609 Other forms of dyspnea: Secondary | ICD-10-CM | POA: Insufficient documentation

## 2023-01-26 DIAGNOSIS — Z950 Presence of cardiac pacemaker: Secondary | ICD-10-CM | POA: Diagnosis not present

## 2023-01-31 ENCOUNTER — Encounter: Payer: Self-pay | Admitting: *Deleted

## 2023-02-02 ENCOUNTER — Encounter: Payer: Self-pay | Admitting: Gastroenterology

## 2023-02-02 ENCOUNTER — Other Ambulatory Visit: Payer: Self-pay | Admitting: Cardiology

## 2023-02-02 ENCOUNTER — Ambulatory Visit: Payer: Medicare Other | Admitting: Gastroenterology

## 2023-02-02 VITALS — BP 135/73 | HR 71 | Temp 98.4°F | Ht 69.0 in | Wt 227.8 lb

## 2023-02-02 DIAGNOSIS — K219 Gastro-esophageal reflux disease without esophagitis: Secondary | ICD-10-CM | POA: Diagnosis not present

## 2023-02-02 DIAGNOSIS — R140 Abdominal distension (gaseous): Secondary | ICD-10-CM

## 2023-02-02 DIAGNOSIS — R109 Unspecified abdominal pain: Secondary | ICD-10-CM

## 2023-02-02 DIAGNOSIS — K5900 Constipation, unspecified: Secondary | ICD-10-CM

## 2023-02-02 MED ORDER — SULFAMETHOXAZOLE-TRIMETHOPRIM 800-160 MG PO TABS: 1.0000 | ORAL_TABLET | Freq: Two times a day (BID) | ORAL | 0 refills | Status: DC

## 2023-02-02 NOTE — Progress Notes (Signed)
GI Office Note    Referring Provider: Babs Sciara, MD Primary Care Physician:  Babs Sciara, MD  Primary Gastroenterologist: Roetta Sessions, MD   Chief Complaint   Chief Complaint  Patient presents with   Follow-up    States that his abdomen has been swelling since having the colonoscopy/EGD done.     History of Present Illness   Lance [name] is a 77 y.o. male presenting today for follow-up.  He was last seen in June 2024.  Was doing very well at his last office visit.  Reflux controlled on pantoprazole.  Dysphagia resolved status post dilation.  No abdominal pain.  Patient presents with concerns about abdominal distention/bloating/gas over the past several months. Symptoms associated with intermittent left sided abdominal pain. States he has swelling worse after meals but abdominal distention is all the time. Sometimes cramping abdominal pain on the left side after eating. Denies change in eating habits. Compared to our scales in June his weight is up two pounds. He eats 5 peanut butter crackers in AM before leaving house. Then sausage biscuit for breakfast. For lunch, two sandwiches, pimento or bologna. Dinner he has a meat and two vegetables. States he was a 36 pant size and went up to a 38 and needs a 40.   He notes passing very small non productive stools 2-3 times per day but can skip 3 days without a stool. 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 Has passed only mucous at times, can be a lot. A lot of gas. Acid reflux at times, has to also take rolaids. Or drink cold water. Happens every couple of weeks.   States his abdominal swelling makes it hard for him to breathe. Had chest xray one week ago and no results available at this time.   Wife having a lot of stomach issues, recently treated for diverticulitis.   EGD April 2024: Esophagus status post dilation.  Subtly abnormality of the gastric and duodenal mucosa.  Biopsy duodenum with no  specific histologic disease.  Gastric biopsies with no H. pylori.   Colonoscopy April 2024: One 5 mm polyp in the mid descending colon, removed.  Status post segmental biopsy.  Elongated and redundant colon.  Random colon biopsies negative.  Tubular adenoma removed.  Consider colonoscopy in 7 years.     Wt Readings from Last 6 Encounters:  02/02/23 227 lb 12.8 oz (103.3 kg)  01/25/23 218 lb (98.9 kg)  11/29/22 223 lb (101.2 kg)  10/28/22 220 lb 9.6 oz (100.1 kg)  10/05/22 225 lb 3.2 oz (102.2 kg)  09/24/22 220 lb (99.8 kg)      Medications   Current Outpatient Medications  Medication Sig Dispense Refill   albuterol (VENTOLIN HFA) 108 (90 Base) MCG/ACT inhaler Inhale 2 puffs into the lungs every 6 (six) hours as needed for wheezing or shortness of breath. 8 g 2   ALPRAZolam (XANAX) 0.5 MG tablet TAKE ONE TABLET BY MOUTH TWICE A DAY 60 tablet 0   aspirin EC 81 MG tablet Take 81 mg by mouth daily.     CILOXAN 0.3 % ophthalmic ointment      clopidogrel (PLAVIX) 75 MG tablet TAKE ONE (1) TABLET BY MOUTH EVERY DAY 90 tablet 1   Coenzyme Q10-Vitamin E (QUNOL ULTRA COQ10 PO) Take 1 capsule by mouth daily.     donepezil (ARICEPT) 10 MG tablet Take 1 tablet (10 mg total) by mouth at bedtime. 90 tablet 3  ezetimibe (ZETIA) 10 MG tablet TAKE ONE TABLET (10MG  TOTAL) BY MOUTH DAILY 90 tablet 3   fexofenadine (ALLEGRA) 180 MG tablet Take 180 mg by mouth daily.     HUMALOG KWIKPEN 100 UNIT/ML KiwkPen Inject 0.32-0.38 mLs (32-38 Units total) into the skin 3 (three) times daily. (Patient taking differently: Inject 25 Units into the skin 3 (three) times daily before meals.) 30 pen 2   HYDROcodone-acetaminophen (NORCO) 7.5-325 MG tablet Take 0.5-1 tablets by mouth every 12 (twelve) hours as needed for moderate pain.     Insulin Detemir (LEVEMIR FLEXTOUCH) 100 UNIT/ML Pen Inject 90 Units into the skin daily at 10 pm. (Patient taking differently: Inject 100 Units into the skin at bedtime.) 30 pen 2    Insulin Pen Needle (B-D ULTRAFINE III SHORT PEN) 31G X 8 MM MISC USE 1 NEEDLE 4 TIMES DAILY AS DIRECTED     losartan (COZAAR) 25 MG tablet Take 25 mg by mouth daily.     memantine (NAMENDA) 10 MG tablet TAKE ONE TABLET BY MOUTH TWOTIMES DAILY 60 tablet 5   metoprolol succinate (TOPROL XL) 25 MG 24 hr tablet Take 0.5 tablets (12.5 mg total) by mouth daily. 45 tablet 1   montelukast (SINGULAIR) 10 MG tablet TAKE 1 TABLET BY MOUTH AT  BEDTIME 100 tablet 2   MYRBETRIQ 25 MG TB24 tablet TAKE ONE TABLET (25MG  TOTAL) BY MOUTH DAILY 30 tablet 11   nitroGLYCERIN (NITROSTAT) 0.4 MG SL tablet PLACE ONE TABLET (0.4MG  TOTAL) UNDER THETONGUE EVERY FIVE MINUTES AS NEEDED FOR CHEST PAIN 25 tablet 3   ONETOUCH ULTRA test strip USE 1 STRIP TO CHECK GLUCOSE 4 TIMES DAILY     pantoprazole (PROTONIX) 40 MG tablet TAKE ONE TABLET BY MOUTH ONCE DAILY, 30 MINUTES BEFORE BREAKFAST. MAY TAKE A SECOND DOSE 30 MINUTES BEFORE DINNER IF NEEDED. 180 tablet 3   Probiotic Product (ALIGN PO) Take 1 capsule by mouth daily.     rosuvastatin (CRESTOR) 40 MG tablet TAKE ONE (1) TABLET BY MOUTH EVERY DAY 90 tablet 1   sertraline (ZOLOFT) 50 MG tablet TAKE ONE (1) TABLET BY MOUTH EVERY DAY 30 tablet 3   tadalafil (CIALIS) 20 MG tablet Take 1 tablet (20 mg total) by mouth daily as needed for erectile dysfunction. 10 tablet 6   Tiotropium Bromide-Olodaterol (STIOLTO RESPIMAT) 2.5-2.5 MCG/ACT AERS Inhale 2 puffs into the lungs daily. (Patient taking differently: Inhale 2 puffs into the lungs daily as needed (shortness of breath).) 4 g 1   vitamin B-12 (CYANOCOBALAMIN) 1000 MCG tablet Take 6,000 mcg by mouth daily.     No current facility-administered medications for this visit.    Allergies   Allergies as of 02/02/2023 - Review Complete 02/02/2023  Allergen Reaction Noted   Morphine and codeine Other (See Comments) 02/08/2012   Penicillins Hives    Percocet [oxycodone-acetaminophen] Other (See Comments) 02/08/2012   Latex Rash  05/13/2014   Levaquin [levofloxacin in d5w] Itching 04/12/2013   Metformin and related Other (See Comments) 04/11/2015   Tape Rash 05/08/2015       Review of Systems   General: Negative for anorexia, weight loss, fever, chills, fatigue, weakness. ENT: Negative for hoarseness, difficulty swallowing , nasal congestion. CV: Negative for chest pain, angina, palpitations, dyspnea on exertion, peripheral edema.  Respiratory: Negative for dyspnea at rest, dyspnea on exertion, cough, sputum, wheezing.  GI: See history of present illness. GU:  Negative for dysuria, hematuria, urinary incontinence, urinary frequency, nocturnal urination.  Endo: Negative for unusual weight change.  Physical Exam   BP 135/73 (BP Location: Right Arm, Patient Position: Sitting, Cuff Size: Large)   Pulse 71   Temp 98.4 F (36.9 C) (Oral)   Ht 5\' 9"  (1.753 m)   Wt 227 lb 12.8 oz (103.3 kg)   SpO2 97%   BMI 33.64 kg/m    General: Well-nourished, well-developed in no acute distress.  Eyes: No icterus. Mouth: Oropharyngeal mucosa moist and pink , no lesions erythema or exudate. Lungs: Clear to auscultation bilaterally.  Heart: Regular rate and rhythm, no murmurs rubs or gallops.  Abdomen: Bowel sounds are normal,  nondistended, no hepatosplenomegaly or masses, no abdominal bruits or hernia , no rebound or guarding. Mild left sided tenderness. Exam limited by body habitus. Normal percussion.  Rectal: not performed Extremities: No lower extremity edema. No clubbing or deformities. Neuro: Alert and oriented x 4   Skin: Warm and dry, no jaundice.   Psych: Alert and cooperative, normal mood and affect.  Labs   Lab Results  Component Value Date   NA 136 08/06/2022   CL 106 08/06/2022   K 3.9 08/06/2022   CO2 23 08/06/2022   BUN 15 08/06/2022   CREATININE 0.94 08/06/2022   GFRNONAA >60 08/06/2022   CALCIUM 8.6 (L) 08/06/2022   PHOS 4.0 07/07/2015   ALBUMIN 4.4 11/29/2022   GLUCOSE 179 (H) 08/06/2022    Lab Results  Component Value Date   ALT 35 11/29/2022   AST 28 11/29/2022   ALKPHOS 76 11/29/2022   BILITOT 0.3 11/29/2022   Lab Results  Component Value Date   WBC 6.5 11/29/2022   HGB 13.5 11/29/2022   HCT 40.7 11/29/2022   MCV 90 11/29/2022   PLT 140 (L) 11/29/2022   Lab Results  Component Value Date   HGBA1C 9.7 11/09/2022     Imaging Studies   No results found.  Assessment   *GERD *Left sided abdominal pain *Bloating *Abdominal distention *Constipation  Some of his bloating/distention may be from ineffective bowels. Passing very small amounts of stool when he does go and can skip days at a time. Could also have small intestinal bacterial overgrowth contributing. I do not see signs of ascites. His weight is up only 2 pounds since our last visit, not enough to explain change in pant sizes. He has some mild left sided abdominal pain off/on. No history of diverticulosis. Updated EGD/colonoscopy as outlined. Reflux doing okay with occasional break through.  We offered him CT imaging for distention/abdomina pain, he declined today. Wants to try medication initially.   PLAN   Start benefiber 2 tsp daily for one week, then increase to bid.  Bactrim one tablet BID for 10 days. If he is not feeling better in a couple of weeks, or symptoms worsen, he should call and we would consider CT at that time.    Leanna Battles. Melvyn Neth, MHS, PA-C [facility]

## 2023-02-02 NOTE — Patient Instructions (Addendum)
Start benefiber two teaspoons daily for the next one week. Then increase to two teaspoons twice daily. You can add this to any liquid or to your food. I am trying to get your bowels to move more effectively, without skipping days in between to see if this helps your abdominal distention. Trial of Bactrim one tablet twice daily for 10 days for possible small intestinal bacterial overgrowth.  If you do not feel better after antibiotics, please let me know and at that time we would recommend CT scan of your abdomen. If you develop runny stools, let me know.

## 2023-02-11 DIAGNOSIS — I2510 Atherosclerotic heart disease of native coronary artery without angina pectoris: Secondary | ICD-10-CM | POA: Diagnosis not present

## 2023-02-11 DIAGNOSIS — E1142 Type 2 diabetes mellitus with diabetic polyneuropathy: Secondary | ICD-10-CM | POA: Diagnosis not present

## 2023-02-11 DIAGNOSIS — Z794 Long term (current) use of insulin: Secondary | ICD-10-CM | POA: Diagnosis not present

## 2023-02-11 DIAGNOSIS — E1165 Type 2 diabetes mellitus with hyperglycemia: Secondary | ICD-10-CM | POA: Diagnosis not present

## 2023-02-11 LAB — AMB RESULTS CONSOLE CBG: Glucose: 255

## 2023-02-11 LAB — HEMOGLOBIN A1C: Hemoglobin A1C: 10.5

## 2023-02-15 ENCOUNTER — Encounter: Payer: Self-pay | Admitting: Family Medicine

## 2023-02-15 ENCOUNTER — Telehealth: Payer: Self-pay | Admitting: Cardiology

## 2023-02-15 NOTE — Telephone Encounter (Signed)
Spoke with patient he wanted to get advice from Dr.Branch on her CXR per Dr.Luking results are in hart

## 2023-02-15 NOTE — Telephone Encounter (Signed)
Patient's friend, Gigi Gin, states [name] advised Dr. Wyline Mood to review xray results.

## 2023-02-15 NOTE — Telephone Encounter (Signed)
No worrisome findings on xray. Suggests some heart enlargement but xrays are poor at measuring this, prior echo looked ok  Dominga Ferry MD

## 2023-02-15 NOTE — Telephone Encounter (Signed)
Patient verbalized understanding

## 2023-02-23 ENCOUNTER — Telehealth: Payer: Self-pay

## 2023-02-23 NOTE — Telephone Encounter (Signed)
Pt called stating that he was fine while taking the antibiotic but went back to feeling the same way after he finished them.

## 2023-02-24 NOTE — Telephone Encounter (Signed)
Let's have him start good bowel regimen given he was having non-productive stools before. And I want him to come back and see Dr. Jena Gauss in couple of weeks. May need CT imaging.  Start miralax two capfuls in 16 ounces of water daily until soft stool, then one capful in 8 ounces of water daily.  Ov with Dr. Jena Gauss in 2 weeks.

## 2023-02-24 NOTE — Telephone Encounter (Signed)
Pt states that he is already having soft stools and isnt having any trouble with his bowels. Pt was scheduled with Dr. Jena Gauss.

## 2023-02-24 NOTE — Telephone Encounter (Signed)
noted 

## 2023-02-25 ENCOUNTER — Ambulatory Visit: Payer: Medicare Other

## 2023-02-25 ENCOUNTER — Ambulatory Visit
Admission: EM | Admit: 2023-02-25 | Discharge: 2023-02-25 | Disposition: A | Discharge status: Home / Self Care | Payer: Medicare Other | Attending: Internal Medicine | Admitting: Internal Medicine

## 2023-02-25 ENCOUNTER — Other Ambulatory Visit: Payer: Self-pay | Admitting: Nurse Practitioner

## 2023-02-25 DIAGNOSIS — R059 Cough, unspecified: Secondary | ICD-10-CM | POA: Diagnosis not present

## 2023-02-25 DIAGNOSIS — R0602 Shortness of breath: Secondary | ICD-10-CM

## 2023-02-25 DIAGNOSIS — J189 Pneumonia, unspecified organism: Secondary | ICD-10-CM | POA: Diagnosis not present

## 2023-02-25 DIAGNOSIS — R052 Subacute cough: Secondary | ICD-10-CM

## 2023-02-25 DIAGNOSIS — Z950 Presence of cardiac pacemaker: Secondary | ICD-10-CM | POA: Diagnosis not present

## 2023-02-25 MED ORDER — ALBUTEROL SULFATE HFA 108 (90 BASE) MCG/ACT IN AERS
2.0000 | INHALATION_SPRAY | Freq: Once | RESPIRATORY_TRACT | Status: AC
  Administered 2023-02-25: 2 via RESPIRATORY_TRACT

## 2023-02-25 MED ORDER — CEFPODOXIME PROXETIL 200 MG PO TABS
200.0000 mg | ORAL_TABLET | Freq: Two times a day (BID) | ORAL | Status: DC
Start: 2023-02-25 — End: 2023-02-25

## 2023-02-25 MED ORDER — BENZONATATE 100 MG PO CAPS: 100.0000 mg | ORAL_CAPSULE | Freq: Three times a day (TID) | ORAL | 0 refills | Status: DC

## 2023-02-25 MED ORDER — AZITHROMYCIN 250 MG PO TABS: 250.0000 mg | ORAL_TABLET | Freq: Every day | ORAL | 0 refills | Status: DC

## 2023-02-25 MED ORDER — CEFPODOXIME PROXETIL 200 MG PO TABS: 200.0000 mg | ORAL_TABLET | Freq: Two times a day (BID) | ORAL | 0 refills | Status: AC
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
## 2023-02-25 NOTE — Discharge Instructions (Signed)
You have pneumonia of the right lower lobe of your lung.  Take antibiotics as prescribed.  Take cough medicines as needed.  Use albuterol inhaler every 4-6 hours as needed for shortness of breath.  Schedule a follow-up appointment with your primary care provider in the next 5 to 7 days for recheck.  If you develop any new or worsening symptoms or if your symptoms do not start to improve, please return here or follow-up with your primary care provider. If your symptoms are severe, please go to the emergency room.

## 2023-02-25 NOTE — ED Triage Notes (Signed)
Pt reports headache around eyes, spitting up phlegm, throat feels raw, trouble breathing, and coughing x 2-3 weeks.   Took tylenol

## 2023-03-04 ENCOUNTER — Ambulatory Visit (INDEPENDENT_AMBULATORY_CARE_PROVIDER_SITE_OTHER): Payer: Medicare Other | Admitting: Family Medicine

## 2023-03-04 VITALS — BP 169/91 | HR 77 | Temp 98.4°F | Wt 232.8 lb

## 2023-03-04 DIAGNOSIS — N289 Disorder of kidney and ureter, unspecified: Secondary | ICD-10-CM

## 2023-03-04 DIAGNOSIS — J189 Pneumonia, unspecified organism: Secondary | ICD-10-CM

## 2023-03-04 MED ORDER — DOXYCYCLINE HYCLATE 100 MG PO TABS
100.0000 mg | ORAL_TABLET | Freq: Two times a day (BID) | ORAL | 0 refills | Status: DC
Start: 2023-03-04 — End: 2023-05-26

## 2023-03-04 MED ORDER — CEFPROZIL 500 MG PO TABS: 500.0000 mg | ORAL_TABLET | Freq: Two times a day (BID) | ORAL | 0 refills | Status: DC

## 2023-03-04 NOTE — Progress Notes (Signed)
Patient has had several weeks of head congestion drainage coughing not feeling good low energy.  Patient went to urgent care recently diagnosed with pneumonia X-ray shows minimal streaking but on physical exam does have crackles in the right base but not severe Patient is already finished antibiotics We will retreat with 2 rounds of Cefzil and doxycycline Supportive measures discussed Recheck within 2 weeks If any setbacks or problems to let us know Lab work next week Follow-up sooner if any problems

## 2023-03-07 DIAGNOSIS — M542 Cervicalgia: Secondary | ICD-10-CM | POA: Diagnosis not present

## 2023-03-07 DIAGNOSIS — M47892 Other spondylosis, cervical region: Secondary | ICD-10-CM | POA: Diagnosis not present

## 2023-03-07 DIAGNOSIS — M791 Myalgia, unspecified site: Secondary | ICD-10-CM | POA: Diagnosis not present

## 2023-03-08 ENCOUNTER — Ambulatory Visit: Payer: Medicare Other | Admitting: Internal Medicine

## 2023-03-11 DIAGNOSIS — N289 Disorder of kidney and ureter, unspecified: Secondary | ICD-10-CM | POA: Diagnosis not present

## 2023-03-12 LAB — CBC WITH DIFFERENTIAL/PLATELET
Basophils Absolute: 0 10*3/uL (ref 0.0–0.2)
Basos: 1 %
EOS (ABSOLUTE): 0.1 10*3/uL (ref 0.0–0.4)
Eos: 1 %
Hematocrit: 38.9 % (ref 37.5–51.0)
Hemoglobin: 12.5 g/dL — ABNORMAL LOW (ref 13.0–17.7)
Immature Grans (Abs): 0 10*3/uL (ref 0.0–0.1)
Immature Granulocytes: 0 %
Lymphocytes Absolute: 1.9 10*3/uL (ref 0.7–3.1)
Lymphs: 33 %
MCH: 28.9 pg (ref 26.6–33.0)
MCHC: 32.1 g/dL (ref 31.5–35.7)
MCV: 90 fL (ref 79–97)
Monocytes Absolute: 0.5 10*3/uL (ref 0.1–0.9)
Monocytes: 9 %
Neutrophils Absolute: 3.1 10*3/uL (ref 1.4–7.0)
Neutrophils: 56 %
Platelets: 143 10*3/uL — ABNORMAL LOW (ref 150–450)
RBC: 4.33 x10E6/uL (ref 4.14–5.80)
RDW: 14.4 % (ref 11.6–15.4)
WBC: 5.6 10*3/uL (ref 3.4–10.8)

## 2023-03-12 LAB — BASIC METABOLIC PANEL (7)
BUN/Creatinine Ratio: 15 (ref 10–24)
BUN: 15 mg/dL (ref 8–27)
CO2: 23 mmol/L (ref 20–29)
Chloride: 104 mmol/L (ref 96–106)
Creatinine, Ser: 1.01 mg/dL (ref 0.76–1.27)
Glucose: 272 mg/dL — ABNORMAL HIGH (ref 70–99)
Potassium: 4.1 mmol/L (ref 3.5–5.2)
Sodium: 140 mmol/L (ref 134–144)
eGFR: 78 mL/min/{1.73_m2} (ref >59)

## 2023-03-15 ENCOUNTER — Other Ambulatory Visit: Payer: Self-pay | Admitting: *Deleted

## 2023-03-15 ENCOUNTER — Ambulatory Visit: Payer: Medicare Other | Admitting: Internal Medicine

## 2023-03-15 ENCOUNTER — Encounter: Payer: Self-pay | Admitting: Internal Medicine

## 2023-03-15 VITALS — BP 145/77 | HR 69 | Temp 97.6°F | Ht 69.0 in | Wt 235.6 lb

## 2023-03-15 DIAGNOSIS — R140 Abdominal distension (gaseous): Secondary | ICD-10-CM | POA: Diagnosis not present

## 2023-03-15 DIAGNOSIS — F1190 Opioid use, unspecified, uncomplicated: Secondary | ICD-10-CM | POA: Diagnosis not present

## 2023-03-15 NOTE — Patient Instructions (Addendum)
It was good to see you again today!  You need further evaluation of your abdominal distention to determine the cause.  Next step is a CT scan your abdomen pelvis with IV oral contrast  Further recommendations to follow.
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
## 2023-03-16 ENCOUNTER — Ambulatory Visit: Payer: Medicare Other | Admitting: Family Medicine

## 2023-03-16 VITALS — BP 144/80 | HR 80 | Ht 69.0 in | Wt 233.6 lb

## 2023-03-16 DIAGNOSIS — R0981 Nasal congestion: Secondary | ICD-10-CM | POA: Diagnosis not present

## 2023-03-16 DIAGNOSIS — F439 Reaction to severe stress, unspecified: Secondary | ICD-10-CM | POA: Diagnosis not present

## 2023-03-16 NOTE — Progress Notes (Signed)
   Subjective:    Patient ID: Lance [name], male    DOB: [birth year], 77 y.o.   MRN: [id number]  Discussed the use of AI scribe software for clinical note transcription with the patient, who gave verbal consent to proceed.  History of Present Illness   The patient, known to have a history of cardiac disease with an ejection fraction at the 50th percentile, presents with complaints of significant congestion, both in the chest and head. He reports a productive cough with white phlegm and no associated fever. The patient describes experiencing shortness of breath, particularly when moving around, but less so when at rest. He denies any signs of infection such as fever or discolored mucus.  The patient also mentions a recent doctor's visit where a CT scan was ordered, but the details of this visit and the reason for the scan are not specified. The patient's congestion is described as clear, suggesting a post-infectious state rather than an active infection.  The patient is scheduled to see a cardiologist in December, presumably for follow-up on his known cardiac disease. The patient expresses some distress and uncertainty about his health situation, but specific stressors are not identified.         Review of Systems     Objective:    Physical Exam   VITALS: SaO2- 98% CHEST: Lungs clear to auscultation. CARDIOVASCULAR: Heart sounds normal.           Assessment & Plan:  Assessment and Plan    Post-Infectious Congestion Persistent nasal and chest congestion with clear mucus production. No signs of active infection. -No additional antibiotics recommended at this time.  Shortness of Breath Experiencing dyspnea on exertion, likely due to nasal congestion and underlying cardiac condition. Ejection fraction at 50% on last echocardiogram. -Continue current management and follow-up with cardiologist on December 13th.  Scheduled CT Scan Patient has a CT scan scheduled in  December. -Continue with planned CT scan as scheduled.   I find no evidence of pneumonia Warning signs were discussed follow-up ongoing trouble Further antibiotics not indicated currently Under fair amount of stress because his wife is having a difficult time

## 2023-03-17 ENCOUNTER — Ambulatory Visit (INDEPENDENT_AMBULATORY_CARE_PROVIDER_SITE_OTHER): Payer: Medicare Other

## 2023-03-17 DIAGNOSIS — Z8679 Personal history of other diseases of the circulatory system: Secondary | ICD-10-CM

## 2023-03-17 LAB — CUP PACEART REMOTE DEVICE CHECK
Battery Remaining Longevity: 84 mo
Battery Remaining Percentage: 86 %
Battery Voltage: 3.02 V
Brady Statistic AP VP Percent: 1 %
Brady Statistic AP VS Percent: 1 %
Brady Statistic AS VP Percent: 1 %
Brady Statistic AS VS Percent: 99 %
Brady Statistic RA Percent Paced: 1 %
Brady Statistic RV Percent Paced: 1 %
Date Time Interrogation Session: 20241114020014
HighPow Impedance: 57 Ohm
HighPow Impedance: 57 Ohm
Implantable Lead Connection Status: 753985
Implantable Lead Connection Status: 753985
Implantable Lead Implant Date: 20050706
Implantable Lead Implant Date: 20161128
Implantable Lead Location: 753859
Implantable Lead Location: 753860
Implantable Lead Model: 7122
Implantable Pulse Generator Implant Date: 20230518
Lead Channel Impedance Value: 350 Ohm
Lead Channel Impedance Value: 380 Ohm
Lead Channel Pacing Threshold Amplitude: 0.75 V
Lead Channel Pacing Threshold Amplitude: 1 V
Lead Channel Pacing Threshold Pulse Width: 0.5 ms
Lead Channel Pacing Threshold Pulse Width: 0.5 ms
Lead Channel Sensing Intrinsic Amplitude: 2.2 mV
Lead Channel Sensing Intrinsic Amplitude: 6.8 mV
Lead Channel Setting Pacing Amplitude: 2 V
Lead Channel Setting Pacing Amplitude: 2.5 V
Lead Channel Setting Pacing Pulse Width: 0.5 ms
Lead Channel Setting Sensing Sensitivity: 0.5 mV
Pulse Gen Serial Number: 8942717

## 2023-03-24 ENCOUNTER — Telehealth: Payer: Self-pay | Admitting: *Deleted

## 2023-03-24 NOTE — Telephone Encounter (Signed)
Copied from CRM [id number]. Topic: Clinical - Medication Refill >> Mar 24, 2023  8:59 AM Hector Shade B wrote: Most Recent Primary Care Visit:  Provider: Lilyan Punt A  Department: [facility]  Visit Type: OFFICE VISIT  Date: 03/16/2023  Medication: patient stated provider advised him to call in if coughing had not cleared up to call in and medication would be called into pharmacy  Has the patient contacted their pharmacy? No (Agent: If no, request that the patient contact the pharmacy for the refill. If patient does not wish to contact the pharmacy document the reason why and proceed with request.) (Agent: If yes, when and what did the pharmacy advise?)  Is this the correct pharmacy for this prescription? Yes If no, delete pharmacy and type the correct one.  This is the patient's preferred pharmacy:  [facility] - [place], [place] - [address] [place] [address] Phone: [phone] Fax: [phone]  [facility] - [place], [place] - [address] [place] [address] Phone: [phone] Fax: [phone]  [facility] [phone] - [place], [place] - [address] AT [facility] [address] [place] [address] Phone: [phone] Fax: [phone]  [id number] - [place], [place] - [address] AT [facility] [address]  [place] [address] Phone: [phone] Fax: [phone]   Has the prescription been filled recently? No  Is the patient out of the medication? Yes  Has the patient been seen for an appointment in the last year OR does the patient have an upcoming appointment? Yes  Can we respond through MyChart? No  Agent: Please be advised that Rx refills may take up to 3 business days. We ask that you follow-up with your pharmacy.

## 2023-03-25 ENCOUNTER — Ambulatory Visit: Payer: Self-pay | Admitting: Family Medicine

## 2023-03-25 ENCOUNTER — Other Ambulatory Visit: Payer: Self-pay

## 2023-03-25 MED ORDER — CEFDINIR 300 MG PO CAPS: 300.0000 mg | ORAL_CAPSULE | Freq: Two times a day (BID) | ORAL | 0 refills | Status: AC

## 2023-03-25 NOTE — Telephone Encounter (Signed)
May do 1 more round of antibiotics.  Omnicef 300 mg 1 twice daily for 7 days-has taken cephalosporins before-but if he continues to have clear congestion no further antibiotics would be necessary

## 2023-03-25 NOTE — Telephone Encounter (Signed)
Patient has been informed , see other phone note

## 2023-03-25 NOTE — Telephone Encounter (Signed)
Patient has been advised and antibiotics orders have been placed.

## 2023-03-25 NOTE — Telephone Encounter (Signed)
Copied from CRM [id number]. Topic: Clinical - Medical Advice >> Mar 25, 2023  3:34 PM Fuller Mandril wrote: Reason for CRM: Pt called about worsening symptoms - states he called yesterday because he has not gotten better and was told something would be called in for him but that has not been done yet. Xfer to E2C[facility] Nurse  Chief Complaint: Wants PCP to know that he is not "feeling better". Patient called in to let PCP know that he is still not feeling better as instructed by PCP.  States PCP said to call back if patient was not feeling any better for possible different antibiotic.  Symptoms: runny nose, cough, congestion and sob at times Frequency: constant Pertinent Negatives: Patient denies wanting to go to UC.  States this has been going on since early Sept.  Disposition: [] ED /[x] Urgent Care (no appt availability in office) / [] Appointment(In office/virtual)/ []  [facility]/ [] Home Care/ [] Refused Recommended Disposition /[] [facility]/ [x]  Follow-up with PCP Additional Notes: Patient called regarding not feeling better since starting antibiotics and was instructed by PCP to let him know if he is not feeling better.  Message routed to PCP office.  Patient denied going to urgent care.  Reason for Disposition  [1] MILD longstanding difficulty breathing AND [2]  SAME as normal  Answer Assessment - Initial Assessment Questions 1. RESPIRATORY STATUS: "Describe your breathing?" (e.g., wheezing, shortness of breath, unable to speak, severe coughing)      Recently had pneumonia and is still not feeling better 2. ONSET: "When did this breathing problem begin?"      About the 10th or the 14th of Sept.  3. PATTERN "Does the difficult breathing come and go, or has it been constant since it started?"      Comes and goes, wheezing.  4. SEVERITY: "How bad is your breathing?" (e.g., mild, moderate, severe)    - MILD: No SOB at rest, mild SOB with walking, speaks normally in sentences, can lie  down, no retractions, pulse < 100.    - MODERATE: SOB at rest, SOB with minimal exertion and prefers to sit, cannot lie down flat, speaks in phrases, mild retractions, audible wheezing, pulse 100-120.    - SEVERE: Very SOB at rest, speaks in single words, struggling to breathe, sitting hunched forward, retractions, pulse > 120      moderate 5. RECURRENT SYMPTOM: "Have you had difficulty breathing before?" If Yes, ask: "When was the last time?" and "What happened that time?"      yes 6. CARDIAC HISTORY: "Do you have any history of heart disease?" (e.g., heart attack, angina, bypass surgery, angioplasty)      yes 7. LUNG HISTORY: "Do you have any history of lung disease?"  (e.g., pulmonary embolus, asthma, emphysema)     denies 8. CAUSE: "What do you think is causing the breathing problem?"      Recent "pneumonia" 9. OTHER SYMPTOMS: "Do you have any other symptoms? (e.g., dizziness, runny nose, cough, chest pain, fever)     Runny nose, cough  Protocols used: Breathing Difficulty-A-AH

## 2023-03-25 NOTE — Telephone Encounter (Signed)
Copied from CRM [id number]. Topic: General - Call Back - No Documentation >> Mar 24, 2023  9:12 AM Amy B wrote: Reason for CRM: Patient states someone from the clinic tried calling him and his phone died.  He requests a call back.

## 2023-03-28 ENCOUNTER — Telehealth: Payer: Self-pay | Admitting: *Deleted

## 2023-03-28 NOTE — Telephone Encounter (Signed)
Copied from CRM [id number]. Topic: Clinical - Prescription Issue >> Mar 25, 2023  3:27 PM Conni Elliot wrote: Reason for CRM: pt called in regards to sinus infection rx being sent to pharmacy, call dropped, call back and no answer

## 2023-03-28 NOTE — Telephone Encounter (Signed)
Patient stated he picked up medication and not having any problems

## 2023-03-30 NOTE — Progress Notes (Signed)
Remote ICD transmission.   

## 2023-04-05 ENCOUNTER — Other Ambulatory Visit: Payer: Self-pay | Admitting: Cardiology

## 2023-04-15 ENCOUNTER — Ambulatory Visit: Payer: Medicare Other | Attending: Cardiology | Admitting: Cardiology

## 2023-04-15 ENCOUNTER — Encounter: Payer: Self-pay | Admitting: Cardiology

## 2023-04-15 VITALS — BP 138/64 | HR 68 | Ht 69.0 in | Wt 237.6 lb

## 2023-04-15 DIAGNOSIS — E782 Mixed hyperlipidemia: Secondary | ICD-10-CM

## 2023-04-15 DIAGNOSIS — I10 Essential (primary) hypertension: Secondary | ICD-10-CM | POA: Diagnosis not present

## 2023-04-15 DIAGNOSIS — R0602 Shortness of breath: Secondary | ICD-10-CM

## 2023-04-15 DIAGNOSIS — R059 Cough, unspecified: Secondary | ICD-10-CM | POA: Diagnosis not present

## 2023-04-15 DIAGNOSIS — I2510 Atherosclerotic heart disease of native coronary artery without angina pectoris: Secondary | ICD-10-CM

## 2023-04-15 MED ORDER — FUROSEMIDE 20 MG PO TABS: 20.0000 mg | ORAL_TABLET | Freq: Every day | ORAL | 3 refills | Status: AC | PRN
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
## 2023-04-15 NOTE — Patient Instructions (Addendum)
Medication Instructions:  Your physician has recommended you make the following change in your medication:  Start furosemide 20 mg daily as needed for swelling Continue all other medications as prescribed  Labwork: none  Testing/Procedures: Your physician has requested that you have a limited echocardiogram. Echocardiography is a painless test that uses sound waves to create images of your heart. It provides your doctor with information about the size and shape of your heart and how well your heart's chambers and valves are working. This procedure takes approximately one hour. There are no restrictions for this procedure. Please do NOT wear cologne, perfume, aftershave, or lotions (deodorant is allowed). Please arrive 15 minutes prior to your appointment time.  Please note: We ask at that you not bring children with you during ultrasound (echo/ vascular) testing. Due to room size and safety concerns, children are not allowed in the ultrasound rooms during exams. Our front office staff cannot provide observation of children in our lobby area while testing is being conducted. An adult accompanying a patient to their appointment will only be allowed in the ultrasound room at the discretion of the ultrasound technician under special circumstances. We apologize for any inconvenience.  Follow-Up: Your physician recommends that you schedule a follow-up appointment in: 3 months  Any Other Special Instructions Will Be Listed Below (If Applicable). You have been referred to Dr. Vassie Loll at [facility] Information for [facility] (Dr. Allena Katz) [phone] given today  If you need a refill on your cardiac medications before your next appointment, please call your pharmacy.

## 2023-04-18 ENCOUNTER — Encounter: Payer: Self-pay | Admitting: Urology

## 2023-04-18 ENCOUNTER — Ambulatory Visit: Payer: Medicare Other | Admitting: Urology

## 2023-04-18 VITALS — BP 171/87 | HR 81

## 2023-04-18 DIAGNOSIS — N401 Enlarged prostate with lower urinary tract symptoms: Secondary | ICD-10-CM

## 2023-04-18 DIAGNOSIS — R350 Frequency of micturition: Secondary | ICD-10-CM | POA: Diagnosis not present

## 2023-04-18 DIAGNOSIS — R7989 Other specified abnormal findings of blood chemistry: Secondary | ICD-10-CM | POA: Diagnosis not present

## 2023-04-18 LAB — URINALYSIS, ROUTINE W REFLEX MICROSCOPIC
Bilirubin, UA: NEGATIVE
Ketones, UA: NEGATIVE
Leukocytes,UA: NEGATIVE
Nitrite, UA: NEGATIVE
Protein,UA: NEGATIVE
RBC, UA: NEGATIVE
Specific Gravity, UA: 1.025 (ref 1.005–1.030)
Urobilinogen, Ur: 1 mg/dL (ref 0.2–1.0)
pH, UA: 6 (ref 5.0–7.5)
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
## 2023-04-19 ENCOUNTER — Telehealth: Payer: Self-pay | Admitting: Urology

## 2023-04-19 NOTE — Telephone Encounter (Signed)
States Lance [name] was suppose to send in a RX and pharmacy has not received anything.

## 2023-04-19 NOTE — Telephone Encounter (Signed)
See below, can you please confirm rx?

## 2023-04-20 ENCOUNTER — Other Ambulatory Visit: Payer: Self-pay | Admitting: Family Medicine

## 2023-04-20 ENCOUNTER — Ambulatory Visit (HOSPITAL_COMMUNITY)
Admission: RE | Admit: 2023-04-20 | Discharge: 2023-04-20 | Disposition: A | Discharge status: Home / Self Care | Payer: Medicare Other | Source: Ambulatory Visit | Attending: Internal Medicine | Admitting: Internal Medicine

## 2023-04-20 ENCOUNTER — Other Ambulatory Visit: Payer: Self-pay | Admitting: Nurse Practitioner

## 2023-04-20 DIAGNOSIS — R140 Abdominal distension (gaseous): Secondary | ICD-10-CM | POA: Diagnosis not present

## 2023-04-20 DIAGNOSIS — K8020 Calculus of gallbladder without cholecystitis without obstruction: Secondary | ICD-10-CM | POA: Diagnosis not present

## 2023-04-20 DIAGNOSIS — N320 Bladder-neck obstruction: Secondary | ICD-10-CM | POA: Diagnosis not present

## 2023-04-20 DIAGNOSIS — N200 Calculus of kidney: Secondary | ICD-10-CM | POA: Diagnosis not present

## 2023-04-20 MED ORDER — IOHEXOL 300 MG/ML  SOLN
100.0000 mL | Freq: Once | INTRAMUSCULAR | Status: AC | PRN
  Administered 2023-04-20: 100 mL via INTRAVENOUS

## 2023-04-21 ENCOUNTER — Ambulatory Visit (INDEPENDENT_AMBULATORY_CARE_PROVIDER_SITE_OTHER): Payer: Medicare Other | Admitting: Family Medicine

## 2023-04-21 VITALS — BP 128/78 | HR 67 | Temp 98.8°F | Ht 69.0 in | Wt 241.2 lb

## 2023-04-21 DIAGNOSIS — R5383 Other fatigue: Secondary | ICD-10-CM | POA: Diagnosis not present

## 2023-04-21 DIAGNOSIS — E538 Deficiency of other specified B group vitamins: Secondary | ICD-10-CM | POA: Diagnosis not present

## 2023-04-21 DIAGNOSIS — E611 Iron deficiency: Secondary | ICD-10-CM | POA: Diagnosis not present

## 2023-04-21 DIAGNOSIS — M791 Myalgia, unspecified site: Secondary | ICD-10-CM | POA: Diagnosis not present

## 2023-04-21 NOTE — Telephone Encounter (Signed)
Patient informed of MD response and voiced understanding.

## 2023-04-21 NOTE — Progress Notes (Signed)
Subjective:    Patient ID: Lance [name], male    DOB: 25-Aug-1945, 77 y.o.   MRN: [id number]  Discussed the use of AI scribe software for clinical note transcription with the patient, who gave verbal consent to proceed.  History of Present Illness   The patient presents with a chief complaint of generalized weakness and fatigue, stating he "just can't get up" and has "no strength." He reports a significant decrease in his ability to perform daily activities, including difficulty standing and walking due to leg weakness. The patient also mentions a significant weight gain, going from a "loose 36 pounds to a tight 40."  The patient has a history of cardiac and pulmonary issues, but recent consultations with a cardiologist and a CT scan of the chest have reportedly shown no significant abnormalities. He is scheduled for an echocardiogram and a follow-up with a pulmonologist.  The patient also reports gastrointestinal issues, including bloating and changes in bowel habits, with bowel movements described as "light yellow," "not hard," and "not runny." A recent CT scan of the abdomen was performed to evaluate these symptoms, but results are pending.  The patient is on multiple medications, including a new diuretic prescribed by his cardiologist. He also mentions a history of stroke and is on anticoagulation therapy.  The patient expresses significant distress and frustration with his current health status, stating he is "tired of living" like this. He reports a decrease in his quality of life, with a significant impact on his ability to work and perform daily activities.  The patient also mentions a recent hypoglycemic episode with a blood sugar level dropping to 53, managed with oatmeal cookies. He is scheduled to see his endocrinologist for further management of his diabetes.  The patient's physical examination revealed some swelling in the legs. The patient is scheduled for a follow-up visit in  six weeks.         Review of Systems     Objective:    Physical Exam   VITALS: BP- 126/68 EXTREMITIES: Mild leg edema.    General-in no acute distress Eyes-no discharge Lungs-respiratory rate normal, CTA CV-no murmurs,RRR Extremities skin warm dry no edema Neuro grossly normal Behavior normal, alert Abdomen is nontender yet distended       Assessment & Plan:  Assessment and Plan    Generalized Weakness Patient reports significant fatigue and lack of energy, impacting daily activities. No clear etiology identified yet. Recent CT scan of abdomen and pelvis performed, results pending. -Await results of recent CT scan. -Consider further workup based on CT results.  Cardiac Function Recent cardiology evaluation with recommendation for echocardiogram. No current symptoms suggestive of cardiac dysfunction. -Continue with planned echocardiogram.  Pulmonary Function Plan for re-evaluation by pulmonology. Patient reports chronic phlegm production. -Follow up with pulmonology as planned.  Abdominal Distension Patient reports feeling bloated and has had significant weight gain. Recent endoscopy and colonoscopy were unremarkable. -Await results of recent CT scan. -Consider further GI workup based on CT results.  Diabetes Management Recent A1C was 10.5, indicating poor glycemic control. Patient reports an episode of hypoglycemia. -Continue current diabetes management plan. -Encourage regular blood glucose monitoring.  General Health Maintenance -Plan for repeat labs in early January as recommended by urologist. -Schedule follow-up appointment in 6 weeks to reassess patient's condition.     1. Other fatigue (Primary) Check thyroid function await results - TSH + free T4  2. Vitamin B12 deficiency Check B12 await results - Vitamin B12  3. Iron  deficiency Consider further testing depending on results of other tests  4. Myalgia Check CK - CK  The best I can tell is  that his fatigue is multifactorial part deconditioning part pulmonary part cardiac also to some degree patient may be dealing with intra-abdominal issue awaiting a CAT scan which was ordered for him.  Recommend follow-up within 6 to 8 weeks complex situation hold off on any additional medicines currently

## 2023-04-22 ENCOUNTER — Telehealth: Payer: Self-pay | Admitting: Internal Medicine

## 2023-04-22 NOTE — Telephone Encounter (Signed)
Fine with me

## 2023-04-22 NOTE — Telephone Encounter (Signed)
Change of provider request per referral from Dr. Dina Rich

## 2023-04-26 ENCOUNTER — Ambulatory Visit: Payer: Medicare Other | Attending: Cardiology

## 2023-04-26 DIAGNOSIS — I2510 Atherosclerotic heart disease of native coronary artery without angina pectoris: Secondary | ICD-10-CM | POA: Diagnosis not present

## 2023-04-26 DIAGNOSIS — R0602 Shortness of breath: Secondary | ICD-10-CM | POA: Diagnosis not present

## 2023-04-26 LAB — ECHOCARDIOGRAM LIMITED
Area-P 1/2: 4.49 cm2
Calc EF: 59.3 %
S' Lateral: 3.6 cm
Single Plane A2C EF: 57.2 %
Single Plane A4C EF: 58.6 %

## 2023-04-26 MED ORDER — PERFLUTREN LIPID MICROSPHERE
1.0000 mL | INTRAVENOUS | Status: AC | PRN
  Administered 2023-04-26: 6 mL via INTRAVENOUS

## 2023-05-02 ENCOUNTER — Telehealth: Payer: Self-pay | Admitting: *Deleted

## 2023-05-02 NOTE — Telephone Encounter (Unsigned)
Copied from CRM [id number]. Topic: Clinical - Lab/Test Results >> May 02, 2023  4:38 PM Elle L wrote: Reason for CRM: The patient wanted to make [name] aware that his scans have become available to view for him to go over. The patient's call back number is [phone] if needed.

## 2023-05-03 LAB — ESTRADIOL, FREE

## 2023-05-03 LAB — TESTOSTERONE, FREE, TOTAL, SHBG
Sex Hormone Binding: 26.2 nmol/L (ref 19.3–76.4)
Testosterone, Free: 5.5 pg/mL — ABNORMAL LOW (ref 6.6–18.1)
Testosterone: 278 ng/dL (ref 264–916)

## 2023-05-05 DIAGNOSIS — G5601 Carpal tunnel syndrome, right upper limb: Secondary | ICD-10-CM | POA: Diagnosis not present

## 2023-05-05 NOTE — Telephone Encounter (Signed)
 Appt scheduled with JE.

## 2023-05-09 ENCOUNTER — Encounter: Payer: Self-pay | Admitting: Family Medicine

## 2023-05-09 NOTE — Progress Notes (Signed)
 Plz mail to patient

## 2023-05-09 NOTE — Telephone Encounter (Signed)
 CAT scan was reviewed, no alarming results, letter sent to patient, has follow-up visit later this month

## 2023-05-11 ENCOUNTER — Telehealth: Payer: Self-pay

## 2023-05-11 ENCOUNTER — Ambulatory Visit: Payer: Medicare Other | Attending: Internal Medicine | Admitting: Internal Medicine

## 2023-05-11 ENCOUNTER — Encounter: Payer: Self-pay | Admitting: Internal Medicine

## 2023-05-11 ENCOUNTER — Other Ambulatory Visit: Payer: Self-pay | Admitting: Urology

## 2023-05-11 VITALS — BP 144/74 | HR 71 | Ht 69.0 in | Wt 233.0 lb

## 2023-05-11 DIAGNOSIS — I255 Ischemic cardiomyopathy: Secondary | ICD-10-CM | POA: Diagnosis not present

## 2023-05-11 MED ORDER — CLOMIPHENE CITRATE 50 MG PO TABS: 25.0000 mg | ORAL_TABLET | Freq: Every day | ORAL | 3 refills | Status: DC

## 2023-05-11 NOTE — Telephone Encounter (Signed)
-----   Message from Donnice Brooks sent at 05/11/2023  9:38 AM EST ----- Let Lance [name] know his T is low. I sent a Rx for clomiphene  1/2 tablet (25 mg daily) to boost his T level. Have him come by the lab three weeks after he starts it for a Testosterone  check. Thanks! ----- Message ----- From: Sammie Exie HERO, CMA Sent: 05/03/2023   7:59 AM EST To: Donnice Brooks, MD  Please review.

## 2023-05-11 NOTE — Patient Instructions (Signed)
 Medication Instructions:  Your physician recommends that you continue on your current medications as directed. Please refer to the Current Medication list given to you today.  *If you need a refill on your cardiac medications before your next appointment, please call your pharmacy*   Lab Work: NONE   If you have labs (blood work) drawn today and your tests are completely normal, you will receive your results only by: MyChart Message (if you have MyChart) OR A paper copy in the mail If you have any lab test that is abnormal or we need to change your treatment, we will call you to review the results.   Testing/Procedures: NONE    Follow-Up: At [facility], you and your health needs are our priority.  As part of our continuing mission to provide you with exceptional heart care, we have created designated Provider Care Teams.  These Care Teams include your primary Cardiologist (physician) and Advanced Practice Providers (APPs -  Physician Assistants and Nurse Practitioners) who all work together to provide you with the care you need, when you need it.  We recommend signing up for the patient portal called "MyChart".  Sign up information is provided on this After Visit Summary.  MyChart is used to connect with patients for Virtual Visits (Telemedicine).  Patients are able to view lab/test results, encounter notes, upcoming appointments, etc.  Non-urgent messages can be sent to your provider as well.   To learn more about what you can do with MyChart, go to [email or web address].    Your next appointment:   1 year(s)  Provider:   Lewayne Bunting, MD    Other Instructions Thank you for choosing [facility]!
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
## 2023-05-11 NOTE — Telephone Encounter (Signed)
 Called Pt to relay message from MD as well as to let him know about the scheduled lab appt on 1/29. Pt said he will pick up his Rx

## 2023-05-12 ENCOUNTER — Other Ambulatory Visit: Payer: Self-pay

## 2023-05-12 DIAGNOSIS — M791 Myalgia, unspecified site: Secondary | ICD-10-CM | POA: Diagnosis not present

## 2023-05-12 DIAGNOSIS — M542 Cervicalgia: Secondary | ICD-10-CM | POA: Diagnosis not present

## 2023-05-12 DIAGNOSIS — G894 Chronic pain syndrome: Secondary | ICD-10-CM | POA: Diagnosis not present

## 2023-05-12 DIAGNOSIS — Z79899 Other long term (current) drug therapy: Secondary | ICD-10-CM | POA: Diagnosis not present

## 2023-05-12 DIAGNOSIS — M47892 Other spondylosis, cervical region: Secondary | ICD-10-CM | POA: Diagnosis not present

## 2023-05-12 DIAGNOSIS — Z79891 Long term (current) use of opiate analgesic: Secondary | ICD-10-CM | POA: Diagnosis not present

## 2023-05-12 LAB — CUP PACEART INCLINIC DEVICE CHECK
Battery Remaining Longevity: 87 mo
Brady Statistic RA Percent Paced: 0.16 %
Brady Statistic RV Percent Paced: 0.42 %
Date Time Interrogation Session: 20250108105300
HighPow Impedance: 69.75 Ohm
Implantable Lead Connection Status: 753985
Implantable Lead Connection Status: 753985
Implantable Lead Implant Date: 20050706
Implantable Lead Implant Date: 20161128
Implantable Lead Location: 753859
Implantable Lead Location: 753860
Implantable Lead Model: 7122
Implantable Pulse Generator Implant Date: 20230518
Lead Channel Impedance Value: 362.5 Ohm
Lead Channel Impedance Value: 412.5 Ohm
Lead Channel Pacing Threshold Amplitude: 1 V
Lead Channel Pacing Threshold Amplitude: 1 V
Lead Channel Pacing Threshold Amplitude: 1 V
Lead Channel Pacing Threshold Amplitude: 1 V
Lead Channel Pacing Threshold Pulse Width: 0.5 ms
Lead Channel Pacing Threshold Pulse Width: 0.5 ms
Lead Channel Pacing Threshold Pulse Width: 0.5 ms
Lead Channel Pacing Threshold Pulse Width: 0.5 ms
Lead Channel Sensing Intrinsic Amplitude: 4.3 mV
Lead Channel Sensing Intrinsic Amplitude: 9.1 mV
Lead Channel Setting Pacing Amplitude: 2 V
Lead Channel Setting Pacing Amplitude: 2.5 V
Lead Channel Setting Pacing Pulse Width: 0.5 ms
Lead Channel Setting Sensing Sensitivity: 0.5 mV
Pulse Gen Serial Number: 8942717

## 2023-05-12 MED ORDER — CLOMIPHENE CITRATE 50 MG PO TABS: 25.0000 mg | ORAL_TABLET | Freq: Every day | ORAL | 3 refills | Status: AC

## 2023-05-13 DIAGNOSIS — E1165 Type 2 diabetes mellitus with hyperglycemia: Secondary | ICD-10-CM | POA: Diagnosis not present

## 2023-05-13 DIAGNOSIS — Z794 Long term (current) use of insulin: Secondary | ICD-10-CM | POA: Diagnosis not present

## 2023-05-13 DIAGNOSIS — I2510 Atherosclerotic heart disease of native coronary artery without angina pectoris: Secondary | ICD-10-CM | POA: Diagnosis not present

## 2023-05-13 DIAGNOSIS — E1142 Type 2 diabetes mellitus with diabetic polyneuropathy: Secondary | ICD-10-CM | POA: Diagnosis not present

## 2023-05-16 ENCOUNTER — Telehealth: Payer: Self-pay

## 2023-05-16 DIAGNOSIS — E538 Deficiency of other specified B group vitamins: Secondary | ICD-10-CM | POA: Diagnosis not present

## 2023-05-16 DIAGNOSIS — R5383 Other fatigue: Secondary | ICD-10-CM | POA: Diagnosis not present

## 2023-05-16 DIAGNOSIS — M791 Myalgia, unspecified site: Secondary | ICD-10-CM | POA: Diagnosis not present

## 2023-05-16 NOTE — Telephone Encounter (Signed)
 Pt wife called and left message asking for refills on her husband tadalafil I called pt back to confirm Rx and wife sated she called pharamacy and got his refills

## 2023-05-17 ENCOUNTER — Encounter: Payer: Self-pay | Admitting: Family Medicine

## 2023-05-17 LAB — TSH+FREE T4
Free T4: 1.01 ng/dL (ref 0.82–1.77)
TSH: 3.44 u[IU]/mL (ref 0.450–4.500)

## 2023-05-17 LAB — CK: Total CK: 77 U/L (ref 41–331)

## 2023-05-17 LAB — VITAMIN B12: Vitamin B-12: 958 pg/mL (ref 232–1245)

## 2023-05-17 NOTE — Progress Notes (Signed)
 Please mail to the patient thank you

## 2023-05-26 ENCOUNTER — Encounter: Payer: Self-pay | Admitting: Emergency Medicine

## 2023-05-26 ENCOUNTER — Emergency Department (HOSPITAL_COMMUNITY)
Admission: EM | Admit: 2023-05-26 | Discharge: 2023-05-26 | Disposition: A | Discharge status: Home / Self Care | Payer: Medicare Other

## 2023-05-26 ENCOUNTER — Ambulatory Visit
Admission: EM | Admit: 2023-05-26 | Discharge: 2023-05-26 | Disposition: A | Discharge status: Home / Self Care | Payer: Medicare Other | Attending: Nurse Practitioner | Admitting: Nurse Practitioner

## 2023-05-26 ENCOUNTER — Other Ambulatory Visit: Payer: Self-pay

## 2023-05-26 ENCOUNTER — Encounter (HOSPITAL_COMMUNITY): Payer: Self-pay

## 2023-05-26 ENCOUNTER — Emergency Department (HOSPITAL_COMMUNITY): Payer: Medicare Other

## 2023-05-26 DIAGNOSIS — Z7902 Long term (current) use of antithrombotics/antiplatelets: Secondary | ICD-10-CM | POA: Insufficient documentation

## 2023-05-26 DIAGNOSIS — E119 Type 2 diabetes mellitus without complications: Secondary | ICD-10-CM | POA: Diagnosis not present

## 2023-05-26 DIAGNOSIS — I2510 Atherosclerotic heart disease of native coronary artery without angina pectoris: Secondary | ICD-10-CM | POA: Diagnosis not present

## 2023-05-26 DIAGNOSIS — Z7982 Long term (current) use of aspirin: Secondary | ICD-10-CM | POA: Diagnosis not present

## 2023-05-26 DIAGNOSIS — J22 Unspecified acute lower respiratory infection: Secondary | ICD-10-CM | POA: Diagnosis not present

## 2023-05-26 DIAGNOSIS — Z794 Long term (current) use of insulin: Secondary | ICD-10-CM | POA: Insufficient documentation

## 2023-05-26 DIAGNOSIS — Z91040 Latex allergy status: Secondary | ICD-10-CM | POA: Insufficient documentation

## 2023-05-26 DIAGNOSIS — I509 Heart failure, unspecified: Secondary | ICD-10-CM | POA: Insufficient documentation

## 2023-05-26 DIAGNOSIS — Z950 Presence of cardiac pacemaker: Secondary | ICD-10-CM | POA: Diagnosis not present

## 2023-05-26 DIAGNOSIS — Z20822 Contact with and (suspected) exposure to covid-19: Secondary | ICD-10-CM | POA: Insufficient documentation

## 2023-05-26 DIAGNOSIS — I110 Hypertensive heart disease with heart failure: Secondary | ICD-10-CM | POA: Diagnosis not present

## 2023-05-26 DIAGNOSIS — J101 Influenza due to other identified influenza virus with other respiratory manifestations: Secondary | ICD-10-CM | POA: Insufficient documentation

## 2023-05-26 DIAGNOSIS — J449 Chronic obstructive pulmonary disease, unspecified: Secondary | ICD-10-CM | POA: Diagnosis not present

## 2023-05-26 DIAGNOSIS — R0602 Shortness of breath: Secondary | ICD-10-CM | POA: Diagnosis not present

## 2023-05-26 DIAGNOSIS — R0682 Tachypnea, not elsewhere classified: Secondary | ICD-10-CM | POA: Diagnosis not present

## 2023-05-26 DIAGNOSIS — R059 Cough, unspecified: Secondary | ICD-10-CM | POA: Diagnosis not present

## 2023-05-26 DIAGNOSIS — R519 Headache, unspecified: Secondary | ICD-10-CM | POA: Diagnosis not present

## 2023-05-26 LAB — POC COVID19/FLU A&B COMBO
Covid Antigen, POC: NEGATIVE
Influenza A Antigen, POC: NEGATIVE
Influenza B Antigen, POC: NEGATIVE

## 2023-05-26 LAB — CBC WITH DIFFERENTIAL/PLATELET
Abs Immature Granulocytes: 0.02 10*3/uL (ref 0.00–0.07)
Basophils Absolute: 0 10*3/uL (ref 0.0–0.1)
Basophils Relative: 1 %
Eosinophils Absolute: 0 10*3/uL (ref 0.0–0.5)
Eosinophils Relative: 1 %
HCT: 41.6 % (ref 39.0–52.0)
Hemoglobin: 13.8 g/dL (ref 13.0–17.0)
Immature Granulocytes: 1 %
Lymphocytes Relative: 19 %
Lymphs Abs: 0.8 10*3/uL (ref 0.7–4.0)
MCH: 28.6 pg (ref 26.0–34.0)
MCHC: 33.2 g/dL (ref 30.0–36.0)
MCV: 86.1 fL (ref 80.0–100.0)
Monocytes Absolute: 0.8 10*3/uL (ref 0.1–1.0)
Monocytes Relative: 18 %
Neutro Abs: 2.6 10*3/uL (ref 1.7–7.7)
Neutrophils Relative %: 60 %
Platelets: 111 10*3/uL — ABNORMAL LOW (ref 150–400)
RBC: 4.83 MIL/uL (ref 4.22–5.81)
RDW: 14.3 % (ref 11.5–15.5)
WBC: 4.3 10*3/uL (ref 4.0–10.5)
nRBC: 0 % (ref 0.0–0.2)

## 2023-05-26 LAB — BASIC METABOLIC PANEL
Anion gap: 12 (ref 5–15)
BUN: 18 mg/dL (ref 8–23)
CO2: 25 mmol/L (ref 22–32)
Calcium: 8.9 mg/dL (ref 8.9–10.3)
Chloride: 96 mmol/L — ABNORMAL LOW (ref 98–111)
Creatinine, Ser: 1.16 mg/dL (ref 0.61–1.24)
GFR, Estimated: >60 mL/min (ref >60)
Glucose, Bld: 122 mg/dL — ABNORMAL HIGH (ref 70–99)
Potassium: 3.7 mmol/L (ref 3.5–5.1)
Sodium: 133 mmol/L — ABNORMAL LOW (ref 135–145)

## 2023-05-26 LAB — RESP PANEL BY RT-PCR (RSV, FLU A&B, COVID)  RVPGX2
Influenza A by PCR: POSITIVE — AB
Influenza B by PCR: NEGATIVE
Resp Syncytial Virus by PCR: NEGATIVE
SARS Coronavirus 2 by RT PCR: NEGATIVE

## 2023-05-26 LAB — TROPONIN I (HIGH SENSITIVITY)
Troponin I (High Sensitivity): 15 ng/L (ref <18)
Troponin I (High Sensitivity): 15 ng/L (ref <18)

## 2023-05-26 MED ORDER — OSELTAMIVIR PHOSPHATE 75 MG PO CAPS: 75.0000 mg | ORAL_CAPSULE | Freq: Two times a day (BID) | ORAL | 0 refills | Status: DC

## 2023-05-26 MED ORDER — METHYLPREDNISOLONE SODIUM SUCC 125 MG IJ SOLR
125.0000 mg | Freq: Once | INTRAMUSCULAR | Status: AC
  Administered 2023-05-26: 125 mg via INTRAVENOUS
  Filled 2023-05-26: qty 2

## 2023-05-26 MED ORDER — IPRATROPIUM-ALBUTEROL 0.5-2.5 (3) MG/3ML IN SOLN
3.0000 mL | Freq: Once | RESPIRATORY_TRACT | Status: AC
  Administered 2023-05-26: 3 mL via RESPIRATORY_TRACT
  Filled 2023-05-26: qty 3

## 2023-05-26 MED ORDER — ACETAMINOPHEN 500 MG PO TABS
1000.0000 mg | ORAL_TABLET | Freq: Once | ORAL | Status: AC
  Administered 2023-05-26: 1000 mg via ORAL
  Filled 2023-05-26: qty 2

## 2023-05-26 MED ORDER — BENZONATATE 100 MG PO CAPS: 100.0000 mg | ORAL_CAPSULE | Freq: Three times a day (TID) | ORAL | 0 refills | Status: DC

## 2023-05-26 MED ORDER — ALBUTEROL SULFATE HFA 108 (90 BASE) MCG/ACT IN AERS
2.0000 | INHALATION_SPRAY | RESPIRATORY_TRACT | Status: DC | PRN
  Administered 2023-05-26: 2 via RESPIRATORY_TRACT
  Filled 2023-05-26: qty 6.7

## 2023-05-26 NOTE — Discharge Instructions (Signed)
Seen in the emergency room today and found to have the flu.  I have sent Tamiflu to your pharmacy please take as prescribed.  I have also sent Tessalon Perles which is for cough you can use as needed for cough.  If you have any shortness of breath or wheezing please use albuterol inhaler that was provided to you today.  If you have fever take Tylenol you can take up to 1000 mg of Tylenol 4 times a day.  Make sure you are staying well-hydrated with water you can alternate Gatorade and Pedialyte as well.  Please schedule follow-up with primary care to ensure your symptoms are improving.  If you have any worsening symptoms please return to the emergency room.

## 2023-05-26 NOTE — Discharge Instructions (Addendum)
Please go directly to the emergency room for further evaluation and management of your symptoms. 

## 2023-05-26 NOTE — ED Notes (Signed)
Patient is being discharged from the Urgent Care and sent to the Emergency Department via POV . Per NP, patient is in need of higher level of care due to tachypnea, concern for bilateral pneumonia. Patient is aware and verbalizes understanding of plan of care.  Vitals:   05/26/23 1423  BP: 115/78  Pulse: 87  Resp: (!) 22  Temp: 99.5 F (37.5 C)  SpO2: 91%
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
## 2023-05-26 NOTE — ED Notes (Signed)
Pt ambulated around nursing station with no assistive devices Pt O2 93-95%: HR 100-105 during ambulation Pt states no dizziness, lightheadedness or shortness of breath

## 2023-05-26 NOTE — ED Triage Notes (Addendum)
Pt reports productive cough, generalized weakness, chills, fever,generalized body aches, diarrhea since last night. Last dose of tylenol 830 this am.

## 2023-05-26 NOTE — ED Triage Notes (Signed)
Pt arrived via POV from Urgent Care for further evaluation of possible bilateral pneumonia. Pt reports Flu and Covid Tests were negative, but Urgent care sent the Pt to ED for possible IV antibiotics. Pts O2 Sats 94% room air. Pt endorses productive cough, and headache.

## 2023-05-26 NOTE — ED Provider Notes (Signed)
[name] EMERG[facility]Y DEPARTMENT AT [facility] Provider Note   CSN: [id number] Arrival date & time: 05/26/23  1620     History  Chief Complaint  Patient presents with   Shortness of Breath    Lance [name] is a 78 y.o. male with past medical history of coronary artery disease, pacemaker, congestive heart failure, diabetes, COPD, hypertension, hyperlipidemia presenting with 2 days of cough, muscle aches, shortness of breath, headache.  Patient was seen at urgent care prior to this and sent here for further workup.  Reports he has had associated fevers and chills.  Is not currently a smoker.  Denies any chest pain or chest tightness.  No abdominal pain nausea vomiting or diarrhea.  Shortness of Breath      Home Medications Prior to Admission medications   Medication Sig Start Date End Date Taking? Authorizing Provider  albuterol (VENTOLIN HFA) 108 (90 Base) MCG/ACT inhaler Inhale 2 puffs into the lungs every 6 (six) hours as needed for wheezing or shortness of breath. 02/11/22   [employer]med[employer], Alvino Chapel, FNP  ALPRAZolam Prudy Feeler) 0.5 MG tablet TAKE ONE TABLET BY MOUTH TWICE A DAY 04/20/23   Babs Sciara, MD  aspirin EC 81 MG tablet Take 81 mg by mouth daily.    [provider]  benzonatate (TESSALON) 100 MG capsule Take 1 capsule (100 mg total) by mouth every 8 (eight) hours. 02/25/23   Carlisle Beers, FNP  cefPROZIL (CEFZIL) 500 MG tablet Take 1 tablet (500 mg total) by mouth 2 (two) times daily. 03/04/23   Babs Sciara, MD  CILOXAN 0.3 % ophthalmic ointment  01/20/23   [provider]  clomiPHENE (CLOMID) 50 MG tablet Take 0.5 tablets (25 mg total) by mouth daily. 05/12/23   Jerilee Field, MD  clopidogrel (PLAVIX) 75 MG tablet TAKE ONE (1) TABLET BY MOUTH EVERY DAY 04/20/23   Babs Sciara, MD  Coenzyme Q10-Vitamin E (QUNOL ULTRA COQ10 PO) Take 1 capsule by mouth daily.    [provider]  Continuous Glucose Sensor (FREESTYLE LIBRE 3 PLUS  SENSOR) MISC SMARTSIG:1 Topical Once a Week 02/21/23   [provider]  donepezil (ARICEPT) 10 MG tablet Take 1 tablet (10 mg total) by mouth at bedtime. 09/24/22   Babs Sciara, MD  doxycycline (VIBRA-TABS) 100 MG tablet Take 1 tablet (100 mg total) by mouth 2 (two) times daily. 03/04/23   Babs Sciara, MD  ezetimibe (ZETIA) 10 MG tablet TAKE ONE TABLET (10MG  TOTAL) BY MOUTH DAILY 12/30/22   Antoine Poche, MD  fexofenadine (ALLEGRA) 180 MG tablet Take 180 mg by mouth daily.    [provider]  furosemide (LASIX) 20 MG tablet Take 1 tablet (20 mg total) by mouth daily as needed (swelling). 04/15/23   Antoine Poche, MD  HUMULIN R U-500 KWIKPEN 500 UNIT/ML KwikPen 80 units before breakfast, 65 units before lunch, 70 units before evening meal Subcutaneous Three times a day thirty minutes before meals 02/11/23   [provider]  HYDROcodone-acetaminophen (NORCO) 7.5-325 MG tablet Take 0.5-1 tablets by mouth every 12 (twelve) hours as needed for moderate pain.    [provider]  Insulin Detemir (LEVEMIR FLEXTOUCH) 100 UNIT/ML Pen Inject 90 Units into the skin daily at 10 pm. Patient taking differently: Inject 100 Units into the skin at bedtime. 07/11/15   Roma Kayser, MD  Insulin Pen Needle (B-D ULTRAFINE III SHORT PEN) 31G X 8 MM MISC USE 1 NEEDLE 4 TIMES DAILY  AS DIRECTED 09/13/18   [provider]  losartan (COZAAR) 25 MG tablet TAKE 1 AND 1/2 TABLETS (37.5 MG TOTAL) BY MOUTH DAILY. 04/06/23   Marinus Maw, MD  memantine (NAMENDA) 10 MG tablet TAKE ONE TABLET BY MOUTH TWICE A DAY 04/20/23   Babs Sciara, MD  metoprolol succinate (TOPROL XL) 25 MG 24 hr tablet Take 0.5 tablets (12.5 mg total) by mouth daily. 08/05/22   Sharlene Dory, NP  montelukast (SINGULAIR) 10 MG tablet TAKE 1 TABLET BY MOUTH AT  BEDTIME 09/30/22   Babs Sciara, MD  MYRBETRIQ 25 MG TB24 tablet TAKE ONE TABLET (25MG  TOTAL) BY MOUTH DAILY 03/31/22   Stoneking,  Danford Bad., MD  nitroGLYCERIN (NITROSTAT) 0.4 MG SL tablet PLACE ONE TABLET (0.4MG  TOTAL) UNDER THETONGUE EVERY FIVE MINUTES AS NEEDED FOR CHEST PAIN 08/19/21   [place] Ouch, Lennart Pall, PA-C  ONETOUCH ULTRA test strip USE 1 STRIP TO CHECK GLUCOSE 4 TIMES DAILY 11/14/18   [provider]  pantoprazole (PROTONIX) 40 MG tablet TAKE ONE TABLET BY MOUTH ONCE DAILY, 30 MINUTES BEFORE BREAKFAST. MAY TAKE A SECOND DOSE 30 MINUTES BEFORE DINNER IF NEEDED. 09/20/22   Tiffany Kocher, PA-C  Probiotic Product (ALIGN PO) Take 1 capsule by mouth daily.    [provider]  rosuvastatin (CRESTOR) 40 MG tablet TAKE ONE (1) TABLET BY MOUTH EVERY DAY 02/02/23   Antoine Poche, MD  sertraline (ZOLOFT) 50 MG tablet TAKE ONE (1) TABLET BY MOUTH EVERY DAY 12/20/22   Babs Sciara, MD  tadalafil (CIALIS) 20 MG tablet Take 1 tablet (20 mg total) by mouth daily as needed for erectile dysfunction. 08/16/22   Jerilee Field, MD  Tiotropium Bromide-Olodaterol (STIOLTO RESPIMAT) 2.5-2.5 MCG/ACT AERS Inhale 2 puffs into the lungs daily. Patient taking differently: Inhale 2 puffs into the lungs daily as needed (shortness of breath). 05/11/22   Babs Sciara, MD  vitamin B-12 (CYANOCOBALAMIN) 1000 MCG tablet Take 6,000 mcg by mouth daily.    [provider]      Allergies    Morphine and codeine, Penicillins, Percocet [oxycodone-acetaminophen], Latex, Levaquin [levofloxacin in d5w], Metformin and related, and Tape    Review of Systems   Review of Systems  Respiratory:  Positive for shortness of breath.     Physical Exam Updated Vital Signs BP (!) 150/83   Pulse 86   Temp (!) 101.9 F (38.8 C) (Oral)   Resp 18   Ht 5\' 9"  (1.753 m)   Wt 105.7 kg   SpO2 92%   BMI 34.41 kg/m  Physical Exam Vitals and nursing note reviewed.  Constitutional:      General: He is not in acute distress.    Appearance: He is not toxic-appearing.  HENT:     Head: Normocephalic and atraumatic.  Eyes:      General: No scleral icterus.    Conjunctiva/sclera: Conjunctivae normal.  Cardiovascular:     Rate and Rhythm: Normal rate and regular rhythm.     Pulses: Normal pulses.     Heart sounds: Normal heart sounds.  Pulmonary:     Effort: Pulmonary effort is normal. No respiratory distress.     Breath sounds: Wheezing present.  Abdominal:     General: Abdomen is flat. Bowel sounds are normal.     Palpations: Abdomen is soft.     Tenderness: There is no abdominal tenderness.  Musculoskeletal:     Right lower leg: No edema.     Left lower leg: No  edema.  Skin:    General: Skin is warm and dry.     Findings: No lesion.  Neurological:     General: No focal deficit present.     Mental Status: He is alert and oriented to person, place, and time. Mental status is at baseline.     ED Results / Procedures / Treatments   Labs (all labs ordered are listed, but only abnormal results are displayed) Labs Reviewed  RESP PANEL BY RT-PCR (RSV, FLU A&B, COVID)  RVPGX2 - Abnormal; Notable for the following components:      Result Value   Influenza A by PCR POSITIVE (*)    All other components within normal limits  CBC WITH DIFFERENTIAL/PLATELET - Abnormal; Notable for the following components:   Platelets 111 (*)    All other components within normal limits  BASIC METABOLIC PANEL - Abnormal; Notable for the following components:   Sodium 133 (*)    Chloride 96 (*)    Glucose, Bld 122 (*)    All other components within normal limits  TROPONIN I (HIGH SENSITIVITY)  TROPONIN I (HIGH SENSITIVITY)    EKG EKG Interpretation Date/Time:  Thursday May 26 2023 17:09:44 EST Ventricular Rate:  94 PR Interval:  214 QRS Duration:  100 QT Interval:  362 QTC Calculation: 452 R Axis:   -31  Text Interpretation: Sinus rhythm with 1st degree A-V block Left axis deviation Pulmonary disease pattern Abnormal ECG When compared with ECG of 06-Aug-2022 12:00, QRS axis Shifted left Nonspecific T wave  abnormality no longer evident in Inferior leads T wave inversion now evident in Lateral leads Confirmed by Beckey Downing [phone]) on 05/26/2023 5:13:14 PM  Radiology DG Chest 2 View Result Date: 05/26/2023 CLINICAL DATA:  Shortness of breath. Possible pneumonia. Productive cough and headaches. EXAM: CHEST - 2 VIEW COMPARISON:  Radiographs 02/25/2023 and 01/26/2023.  CT 01/12/2022. FINDINGS: Left subclavian AICD leads appear unchanged. The heart size and mediastinal contours are stable status post median sternotomy and CABG. The lungs are clear. There is no pleural effusion or pneumothorax. No acute osseous findings are seen status post lower cervical fusion. Telemetry leads overlie the chest. IMPRESSION: No evidence of acute cardiopulmonary process. Stable postoperative chest. Electronically Signed   By: Carey Bullocks M.D.   On: 05/26/2023 18:18    Procedures Procedures    Medications Ordered in ED Medications  albuterol (VENTOLIN HFA) 108 (90 Base) MCG/ACT inhaler 2 puff (2 puffs Inhalation Given 05/26/23 1742)  ipratropium-albuterol (DUONEB) 0.5-2.5 (3) MG/3ML nebulizer solution 3 mL (3 mLs Nebulization Given 05/26/23 1823)  acetaminophen (TYLENOL) tablet 1,000 mg (1,000 mg Oral Given 05/26/23 1845)  ipratropium-albuterol (DUONEB) 0.5-2.5 (3) MG/3ML nebulizer solution 3 mL (3 mLs Nebulization Given 05/26/23 1954)  methylPREDNISolone sodium succinate (SOLU-MEDROL) 125 mg/2 mL injection 125 mg (125 mg Intravenous Given 05/26/23 2042)    ED Course/ Medical Decision Making/ A&P Clinical Course as of 05/26/23 2147  Thu May 26, 2023  2135 Ambulated with pulse ox, did not desat and was not SOB. 95% RA throughout stay.  [JB]    Clinical Course User Index [JB] Audrey Thull, Horald Chestnut, PA-C                                 Medical Decision Making Amount and/or Complexity of Data Reviewed Labs: ordered. Radiology: ordered.  Risk OTC drugs. Prescription drug management.   [name] 78 y.o.  presented today for shortness  of breath.  Working DDx that I considered at this time includes, but not limited to, asthma/COPD exacerbation, URI, viral illness, anemia, ACS, PE, pneumonia, pleural effusion, lung mass.  R/o DDx: These are considered less likely due to history of present illness, physical exam, labs/imaging findings  Pmhx: coronary artery disease, pacemaker, congestive heart failure, diabetes, COPD, hypertension, hyperlipidemia  Review of prior external notes: 05/26/23 UC visit   Unique Tests and My Interpretation:  CBC: No leukocytosis, no anemia BMP: Sodium 133, potassium 3.7, glucose 122 no AKI and no anion gap EKG: 1st degree AV block, no significant change since prior EKG Troponin: 15, 15 CXR: no acute pathology   Respiratory Panel: Influenza A  Problem List / ED Course / Critical interventions / Medication management  Reporting to emergency room with flulike symptoms.  He primarily reports 2 days of productive cough, shortness of breath.  He tested positive for influenza.  He is 95% on room air no increased respiratory rate and normal vitals.  He is not requiring any oxygen.  Chest x-ray without any pneumonia, pneumothorax or any other acute pathology.  He has had 2 stable troponins and is not endorsing chest pain thus doubt ACS.  He has no sign of DVT on exam thus doubt PE.  His lab work here is reassuring.  Given that his symptoms started 2 days ago will send him home with Tamiflu and symptom management.  Will make sure patient is able to ambulate and not hypoxic prior to discharge. Ambulating without assistance.  Patient is not hypoxic no increased work of breathing and not short of breath.  Lungs clear to auscultation bilaterally, patient reports improvement in shortness of breath and wheezing after DuoNeb. I ordered medication including DuoNeb and steriods.   Reevaluation of the patient after these medicines showed that the patient resolved Patients vitals assessed.  Upon arrival patient is hemodynamically stable and febrile, will order Tylenol. I have reviewed the patients home medicines and have made adjustments as needed    Plan:  F/u w/ PCP in 2-3d to ensure resolution of sx.  Patient was given return precautions. Patient stable for discharge at this time.  Patient educated on sx/dx and verbalized understanding of plan. Will return to ER for new or worsening sx.          Final Clinical Impression(s) / ED Diagnoses Final diagnoses:  Influenza A    Rx / DC Orders ED Discharge Orders     None         Reinaldo Raddle 05/26/23 2226    Durwin Glaze, MD 05/26/23 2316

## 2023-05-27 ENCOUNTER — Other Ambulatory Visit: Payer: Self-pay | Admitting: Family Medicine

## 2023-05-30 ENCOUNTER — Ambulatory Visit (INDEPENDENT_AMBULATORY_CARE_PROVIDER_SITE_OTHER): Payer: Medicare Other | Admitting: Family Medicine

## 2023-05-30 VITALS — BP 116/70 | HR 68 | Temp 97.9°F | Ht 69.0 in | Wt 232.6 lb

## 2023-05-30 DIAGNOSIS — J111 Influenza due to unidentified influenza virus with other respiratory manifestations: Secondary | ICD-10-CM | POA: Diagnosis not present

## 2023-05-30 DIAGNOSIS — R5383 Other fatigue: Secondary | ICD-10-CM

## 2023-05-30 MED ORDER — CEFPROZIL 500 MG PO TABS: 500.0000 mg | ORAL_TABLET | Freq: Two times a day (BID) | ORAL | 0 refills | Status: DC

## 2023-05-30 NOTE — Progress Notes (Signed)
   Subjective:    Patient ID: Lance [name], male    DOB: 1946-02-14, 78 y.o.   MRN: [id number]  Discussed the use of AI scribe software for clinical note transcription with the patient, who gave verbal consent to proceed.  History of Present Illness   The patient, with a history of gallstones, kidney stones, and an enlarged prostate, presented with recent flu-like symptoms including fever, congestion, and wheezing. He was seen at an urgent care center and subsequently sent to the hospital for further evaluation. At the hospital, he underwent an EKG and blood tests, including a complete blood count and cardiac enzymes. He was diagnosed with the flu and started on Tamiflu and antibiotics.  Despite treatment, the patient continues to experience significant symptoms including cough, headache, and night sweats. He also reports increased urinary frequency, with minimal pressure, suggesting worsening of his known prostate enlargement. He has previously undergone a procedure to alleviate symptoms related to his enlarged prostate and is open to seeing a urologist for further management.  The patient also reports a lack of energy and motivation, which he describes as worse than depression, but denies feeling depressed. He has been started on testosterone replacement therapy for low testosterone levels, which he hopes might improve his energy levels.         Review of Systems     Objective:    Physical Exam   CHEST: Wheezing present. No pneumonia auscultated.     General-in no acute distress Eyes-no discharge CV-no murmurs,RRR Extremities skin warm dry no edema Neuro grossly normal Behavior normal, alert       Assessment & Plan:  Assessment and Plan    Influenza Recent diagnosis with persistent symptoms including cough, wheezing, and night sweats. No signs of pneumonia on auscultation. -Continue Tamiflu as prescribed by ER. -Start a course of antibiotics for potential secondary  infection.  Benign Prostatic Hyperplasia Reports of frequent urination and weak stream. History of urological procedures. -Refer to urology for evaluation and potential intervention.  Gallstones Asymptomatic gallstones identified on recent scan. No signs of cholecystitis. -Monitor for symptoms of cholecystitis such as severe abdominal pain after eating.  Kidney Stone Asymptomatic kidney stone identified on recent scan. -Monitor for symptoms of renal colic.  Low Testosterone Reports of low energy and motivation. Currently on testosterone replacement therapy. -Continue current therapy and monitor for improvement.  Follow-up in spring for routine care.

## 2023-06-01 ENCOUNTER — Other Ambulatory Visit: Payer: Medicare Other

## 2023-06-01 DIAGNOSIS — R350 Frequency of micturition: Secondary | ICD-10-CM | POA: Diagnosis not present

## 2023-06-03 LAB — TESTOSTERONE,FREE AND TOTAL
Testosterone, Free: 7.8 pg/mL (ref 6.6–18.1)
Testosterone: 459 ng/dL (ref 264–916)

## 2023-06-10 ENCOUNTER — Telehealth: Payer: Self-pay

## 2023-06-10 NOTE — Telephone Encounter (Signed)
 Called Pt to relay message from MD per last telephone encounter. Pt voiced understanding

## 2023-06-10 NOTE — Telephone Encounter (Signed)
-----   Message from Christina Coyer sent at 06/09/2023  4:42 PM EST ----- Let Adaline Holly know his T levels are normal - looks good. ----- Message ----- From: Dyke Glasser, CMA Sent: 06/03/2023   8:27 AM EST To: Christina Coyer, MD  Please review

## 2023-06-13 NOTE — Progress Notes (Signed)
GI Office Note    Referring Provider: Babs Sciara, MD Primary Care Physician:  Babs Sciara, MD  Primary Gastroenterologist: Roetta Sessions, MD   Chief Complaint   Chief Complaint  Patient presents with   Follow-up    Follow up on CT scan. Pt states he feels ok    History of Present Illness   Lance [name] is a 78 y.o. male presenting today for follow up of abdominal distention. Did not seem to get much improvement with bactrim given for possible SIBO previously.   Today: since last office visit he completed a CT abdomen and pelvis for abdominal distention.  Nothing found on his study to explain his symptoms.  No evidence of ascites.  Nothing to explain significant weight gain.  He was found to have cholelithiasis but appears to be asymptomatic.  Nonobstructing left kidney stones.  Mildly enlarged prostate with findings of chronic bladder outlet obstruction.  Patient has urology referral pending.  Patient is frustrated with weight gain.  He is gained 40 pounds since August 2023.  He states he does not eat that much.  Typically eats a bacon and egg sandwich for breakfast, 2 sandwiches at lunch typically pimento cheese or bologna and cheese.  The only change has been over the last several years instead of having home-cooked dinners, they are eating a lot of microwave meals.  He has noted significant decline in energy over the last several years.  He does not feel like he is depressed.  He has had a lot of issues he is dealt with in the past with a loss of 2 sons but feels like he has gotten over this.  Some days he does not feel like getting out of the bed because of his low energy levels.  Pulmonology consult pending.  He is followed by cardiology.  Within 30 minutes after eat, abdominal cramping left abdomen, like need to get diarrhea and doesn't.  Sometimes he does pass a stool.  Other times during the day he typically has about 2 significant soft to loose daily.  No melena or  rectal bleeding.  Upper GI symptoms well-controlled.   CT A/P with contrast 04/2023: IMPRESSION: -No acute findings. -Cholelithiasis. No radiographic evidence of cholecystitis. -Left nephrolithiasis. No evidence of ureteral calculi or hydronephrosis. -Mildly enlarged prostate, and findings of chronic bladder outlet obstruction.  EGD April 2024: Esophagus status post dilation.  Subtly abnormality of the gastric and duodenal mucosa.  Biopsy duodenum with no specific histologic disease.  Gastric biopsies with no H. pylori.   Colonoscopy April 2024: One 5 mm polyp in the mid descending colon, removed.  Status post segmental biopsy.  Elongated and redundant colon.  Random colon biopsies negative.  Tubular adenoma removed.  Consider colonoscopy in 7 years.     Wt Readings from Last 40 Encounters:  06/14/23 242 lb (109.8 kg)  05/30/23 232 lb 9.6 oz (105.5 kg)  05/26/23 233 lb 0.4 oz (105.7 kg)  05/11/23 233 lb (105.7 kg)  04/21/23 241 lb 3.2 oz (109.4 kg)  04/15/23 237 lb 9.6 oz (107.8 kg)  03/16/23 233 lb 9.6 oz (106 kg)  03/15/23 235 lb 9.6 oz (106.9 kg)  03/04/23 232 lb 12.8 oz (105.6 kg)  02/02/23 227 lb 12.8 oz (103.3 kg)  01/25/23 218 lb (98.9 kg)  11/29/22 223 lb (101.2 kg)  10/28/22 220 lb 9.6 oz (100.1 kg)  10/05/22 225 lb 3.2 oz (102.2 kg)  09/24/22 220 lb (99.8 kg)  08/11/22 213 lb 12.8 oz (97 kg)  08/06/22 213 lb 12.8 oz (97 kg)  08/05/22 213 lb 12.8 oz (97 kg)  07/27/22 212 lb 9.6 oz (96.4 kg)  07/26/22 211 lb 9.6 oz (96 kg)  07/20/22 217 lb 3.2 oz (98.5 kg)  06/23/22 212 lb 9.6 oz (96.4 kg)  06/17/22 215 lb 9.6 oz (97.8 kg)  06/14/22 215 lb 3.2 oz (97.6 kg)  06/07/22 216 lb 3.2 oz (98.1 kg)  06/02/22 216 lb 3.2 oz (98.1 kg)  05/26/22 212 lb (96.2 kg)  05/20/22 217 lb 9.6 oz (98.7 kg)  04/28/22 211 lb (95.7 kg)  04/14/22 209 lb 9.6 oz (95.1 kg)  03/30/22 206 lb (93.4 kg)  03/24/22 204 lb (92.5 kg)  02/24/22 206 lb 9.6 oz (93.7 kg)  02/19/22 207 lb 9.6 oz (94.2  kg)  02/11/22 202 lb (91.6 kg)  02/01/22 198 lb (89.8 kg)  12/31/21 200 lb (90.7 kg)  12/24/21 201 lb (91.2 kg)  12/23/21 199 lb 9.6 oz (90.5 kg)  12/23/21 200 lb (90.7 kg)    Medications   Current Outpatient Medications  Medication Sig Dispense Refill   albuterol (VENTOLIN HFA) 108 (90 Base) MCG/ACT inhaler Inhale 2 puffs into the lungs every 6 (six) hours as needed for wheezing or shortness of breath. 8 g 2   ALPRAZolam (XANAX) 0.5 MG tablet TAKE ONE TABLET BY MOUTH TWICE A DAY (Patient taking differently: Take 0.5 mg by mouth at bedtime.) 60 tablet 2   benzonatate (TESSALON) 100 MG capsule Take 1 capsule (100 mg total) by mouth every 8 (eight) hours. 21 capsule 0   clomiPHENE (CLOMID) 50 MG tablet Take 0.5 tablets (25 mg total) by mouth daily. 15 tablet 3   clopidogrel (PLAVIX) 75 MG tablet TAKE ONE (1) TABLET BY MOUTH EVERY DAY (Patient taking differently: Take 75 mg by mouth daily.) 90 tablet 1   Coenzyme Q10-Vitamin E (QUNOL ULTRA COQ10 PO) Take 1 capsule by mouth daily.     Continuous Glucose Sensor (FREESTYLE LIBRE 3 PLUS SENSOR) MISC SMARTSIG:1 Topical Once a Week     donepezil (ARICEPT) 10 MG tablet Take 1 tablet (10 mg total) by mouth at bedtime. 90 tablet 3   ezetimibe (ZETIA) 10 MG tablet TAKE ONE TABLET (10MG  TOTAL) BY MOUTH DAILY (Patient taking differently: Take 10 mg by mouth daily.) 90 tablet 3   FARXIGA 10 MG TABS tablet Take 10 mg by mouth daily.     fexofenadine (ALLEGRA) 180 MG tablet Take 180 mg by mouth daily.     furosemide (LASIX) 20 MG tablet Take 1 tablet (20 mg total) by mouth daily as needed (swelling). 30 tablet 3   HUMULIN R U-500 KWIKPEN 500 UNIT/ML KwikPen Inject 50-80 Units into the skin 3 (three) times daily with meals. 80 units in the morning, 50 units at lunch, and 60 units in the evening.     HYDROcodone-acetaminophen (NORCO) 7.5-325 MG tablet Take 0.5-1 tablets by mouth every 12 (twelve) hours as needed for moderate pain.     Insulin Pen Needle (B-D  ULTRAFINE III SHORT PEN) 31G X 8 MM MISC USE 1 NEEDLE 4 TIMES DAILY AS DIRECTED     losartan (COZAAR) 25 MG tablet TAKE 1 AND 1/2 TABLETS (37.5 MG TOTAL) BY MOUTH DAILY. (Patient taking differently: Take 25 mg by mouth daily.) 135 tablet 1   memantine (NAMENDA) 10 MG tablet TAKE ONE TABLET BY MOUTH TWICE A DAY 60 tablet 5   metoprolol succinate (TOPROL XL) 25 MG  24 hr tablet Take 0.5 tablets (12.5 mg total) by mouth daily. 45 tablet 1   montelukast (SINGULAIR) 10 MG tablet TAKE 1 TABLET BY MOUTH AT  BEDTIME 100 tablet 2   nitroGLYCERIN (NITROSTAT) 0.4 MG SL tablet PLACE ONE TABLET (0.4MG  TOTAL) UNDER THETONGUE EVERY FIVE MINUTES AS NEEDED FOR CHEST PAIN (Patient taking differently: Place 0.4 mg under the tongue every 5 (five) minutes as needed for chest pain.) 25 tablet 3   ONETOUCH ULTRA test strip USE 1 STRIP TO CHECK GLUCOSE 4 TIMES DAILY     pantoprazole (PROTONIX) 40 MG tablet TAKE ONE TABLET BY MOUTH ONCE DAILY, 30 MINUTES BEFORE BREAKFAST. MAY TAKE A SECOND DOSE 30 MINUTES BEFORE DINNER IF NEEDED. (Patient taking differently: Take 40 mg by mouth See admin instructions. TAKE ONE TABLET BY MOUTH ONCE DAILY, 30 MINUTES BEFORE BREAKFAST. MAY TAKE A SECOND DOSE 30 MINUTES BEFORE DINNER IF NEEDED.) 180 tablet 3   rosuvastatin (CRESTOR) 40 MG tablet TAKE ONE (1) TABLET BY MOUTH EVERY DAY (Patient taking differently: Take 40 mg by mouth daily.) 90 tablet 1   sertraline (ZOLOFT) 50 MG tablet TAKE ONE (1) TABLET BY MOUTH EVERY DAY 30 tablet 8   tadalafil (CIALIS) 20 MG tablet Take 1 tablet (20 mg total) by mouth daily as needed for erectile dysfunction. 10 tablet 6   Tiotropium Bromide-Olodaterol (STIOLTO RESPIMAT) 2.5-2.5 MCG/ACT AERS Inhale 2 puffs into the lungs daily. (Patient taking differently: Inhale 2 puffs into the lungs daily as needed (shortness of breath).) 4 g 1   vitamin B-12 (CYANOCOBALAMIN) 1000 MCG tablet Take 6,000 mcg by mouth daily.     No current facility-administered medications for  this visit.    Allergies   Allergies as of 06/14/2023 - Review Complete 06/14/2023  Allergen Reaction Noted   Morphine and codeine Other (See Comments) 02/08/2012   Penicillins Hives    Percocet [oxycodone-acetaminophen] Other (See Comments) 02/08/2012   Latex Rash 05/13/2014   Levaquin [levofloxacin in d5w] Itching 04/12/2013   Metformin and related Other (See Comments) 04/11/2015   Tape Rash 05/08/2015     Review of Systems   General: Negative for anorexia, weight loss, fever, chills,  weakness.+fatigue ENT: Negative for hoarseness, difficulty swallowing , nasal congestion. CV: Negative for chest pain, angina, palpitations, +dyspnea on exertion,no peripheral edema.  Respiratory: Negative for dyspnea at rest, +dyspnea on exertion. No cough, sputum, wheezing.  GI: See history of present illness. GU:  Negative for dysuria, hematuria, urinary incontinence, urinary frequency, nocturnal urination.  Endo: Negative for unusual weight change.     Physical Exam   BP 138/72   Pulse 77   Temp 98.4 F (36.9 C)   Ht 5\' 9"  (1.753 m)   Wt 242 lb (109.8 kg)   BMI 35.74 kg/m    General: Well-nourished, well-developed in no acute distress.  Eyes: No icterus. Mouth: Oropharyngeal mucosa moist and pink ,  Lungs: Clear to auscultation bilaterally.  Heart: Regular rate and rhythm, no murmurs rubs or gallops.  Abdomen: Bowel sounds are normal, nontender,  no hepatosplenomegaly or masses,  no abdominal bruits or hernia , no rebound or guarding. Exam limited due to body habitus Rectal: not performed  Extremities: No lower extremity edema. No clubbing or deformities. Neuro: Alert and oriented x 4   Skin: Warm and dry, no jaundice.   Psych: Alert and cooperative, normal mood and affect.  Labs   Lab Results  Component Value Date   NA 133 (L) 05/26/2023   CL 96 (L)  05/26/2023   K 3.7 05/26/2023   CO2 25 05/26/2023   BUN 18 05/26/2023   CREATININE 1.16 05/26/2023   GFRNONAA >60  05/26/2023   CALCIUM 8.9 05/26/2023   PHOS 4.0 07/07/2015   ALBUMIN 4.4 11/29/2022   GLUCOSE 122 (H) 05/26/2023   Lab Results  Component Value Date   WBC 4.3 05/26/2023   HGB 13.8 05/26/2023   HCT 41.6 05/26/2023   MCV 86.1 05/26/2023   PLT 111 (L) 05/26/2023   Lab Results  Component Value Date   ALT 35 11/29/2022   AST 28 11/29/2022   ALKPHOS 76 11/29/2022   BILITOT 0.3 11/29/2022   Lab Results  Component Value Date   TSH 3.440 05/16/2023   Lab Results  Component Value Date   VITAMINB12 958 05/16/2023   Lab Results  Component Value Date   HGBA1C 10.5 02/11/2023    Imaging Studies   DG Chest 2 View Result Date: 05/26/2023 CLINICAL DATA:  Shortness of breath. Possible pneumonia. Productive cough and headaches. EXAM: CHEST - 2 VIEW COMPARISON:  Radiographs 02/25/2023 and 01/26/2023.  CT 01/12/2022. FINDINGS: Left subclavian AICD leads appear unchanged. The heart size and mediastinal contours are stable status post median sternotomy and CABG. The lungs are clear. There is no pleural effusion or pneumothorax. No acute osseous findings are seen status post lower cervical fusion. Telemetry leads overlie the chest. IMPRESSION: No evidence of acute cardiopulmonary process. Stable postoperative chest. Electronically Signed   By: Carey Bullocks M.D.   On: 05/26/2023 18:18    Assessment/Plan:   GERD: -doing well on pantoprazole 40mg  once daily -reinforced antireflux measures  Left sided abdominal cramping, postprandial: -postprandial, sometimes associated with BM -doubt biliary in nature -trial of low dose bentyl just before largest meal of the day, typically more issues at this time  Abdominal distention/weight gain:  -40 pound weight gain since 2023 -no ascites on CT -notes eating mostly microwave meals at dinner, may be calorie dense and contributing to weight gain -TSH normal -last A1C over 10 -encouraged patient to follow up with PCP regarding fatigue, weight gain,  possible depression contributing     Leanna Battles. Melvyn Neth, MHS, PA-C [facility]

## 2023-06-14 ENCOUNTER — Encounter: Payer: Self-pay | Admitting: Gastroenterology

## 2023-06-14 ENCOUNTER — Ambulatory Visit: Payer: Medicare Other | Admitting: Gastroenterology

## 2023-06-14 VITALS — BP 138/72 | HR 77 | Temp 98.4°F | Ht 69.0 in | Wt 242.0 lb

## 2023-06-14 DIAGNOSIS — R635 Abnormal weight gain: Secondary | ICD-10-CM | POA: Diagnosis not present

## 2023-06-14 DIAGNOSIS — R140 Abdominal distension (gaseous): Secondary | ICD-10-CM | POA: Diagnosis not present

## 2023-06-14 DIAGNOSIS — K219 Gastro-esophageal reflux disease without esophagitis: Secondary | ICD-10-CM | POA: Diagnosis not present

## 2023-06-14 DIAGNOSIS — R109 Unspecified abdominal pain: Secondary | ICD-10-CM | POA: Diagnosis not present

## 2023-06-14 MED ORDER — DICYCLOMINE HCL 10 MG PO CAPS: ORAL_CAPSULE | ORAL | 1 refills | Status: DC

## 2023-06-14 NOTE — Patient Instructions (Signed)
Continue pantoprazole 40 mg daily for acid reflux. Trial of dicyclomine 10 mg just before your evening meal which seems to be you have the most left-sided abdominal cramping.  Please stop if you notice significant drowsiness, increased difficulty urinating. Recommend following through with pulmonology and urology consults. Abdominal distention due to weight gain.  Thyroid function recently normal.  Consider following up with PCP to address weight gain. Return to the office in 3 months but please call sooner if symptoms worsen.

## 2023-06-15 ENCOUNTER — Ambulatory Visit: Payer: Medicare Other | Admitting: Gastroenterology

## 2023-06-16 ENCOUNTER — Ambulatory Visit (INDEPENDENT_AMBULATORY_CARE_PROVIDER_SITE_OTHER): Payer: Medicare Other

## 2023-06-16 DIAGNOSIS — I255 Ischemic cardiomyopathy: Secondary | ICD-10-CM | POA: Diagnosis not present

## 2023-06-21 ENCOUNTER — Encounter: Payer: Self-pay | Admitting: Internal Medicine

## 2023-06-21 LAB — CUP PACEART REMOTE DEVICE CHECK
Battery Remaining Longevity: 82 mo
Battery Remaining Percentage: 83 %
Battery Voltage: 3.01 V
Brady Statistic AP VP Percent: 1 %
Brady Statistic AP VS Percent: 1 %
Brady Statistic AS VP Percent: 1 %
Brady Statistic AS VS Percent: 99 %
Brady Statistic RA Percent Paced: 1 %
Brady Statistic RV Percent Paced: 1 %
Date Time Interrogation Session: 20250213020021
HighPow Impedance: 55 Ohm
HighPow Impedance: 55 Ohm
Implantable Lead Connection Status: 753985
Implantable Lead Connection Status: 753985
Implantable Lead Implant Date: 20050706
Implantable Lead Implant Date: 20161128
Implantable Lead Location: 753859
Implantable Lead Location: 753860
Implantable Lead Model: 7122
Implantable Pulse Generator Implant Date: 20230518
Lead Channel Impedance Value: 350 Ohm
Lead Channel Impedance Value: 360 Ohm
Lead Channel Pacing Threshold Amplitude: 1 V
Lead Channel Pacing Threshold Amplitude: 1 V
Lead Channel Pacing Threshold Pulse Width: 0.5 ms
Lead Channel Pacing Threshold Pulse Width: 0.5 ms
Lead Channel Sensing Intrinsic Amplitude: 3.4 mV
Lead Channel Sensing Intrinsic Amplitude: 6.9 mV
Lead Channel Setting Pacing Amplitude: 2 V
Lead Channel Setting Pacing Amplitude: 2.5 V
Lead Channel Setting Pacing Pulse Width: 0.5 ms
Lead Channel Setting Sensing Sensitivity: 0.5 mV
Pulse Gen Serial Number: 8942717

## 2023-06-24 ENCOUNTER — Ambulatory Visit (HOSPITAL_BASED_OUTPATIENT_CLINIC_OR_DEPARTMENT_OTHER): Payer: Medicare Other | Admitting: Pulmonary Disease

## 2023-07-07 DIAGNOSIS — E113593 Type 2 diabetes mellitus with proliferative diabetic retinopathy without macular edema, bilateral: Secondary | ICD-10-CM | POA: Diagnosis not present

## 2023-07-07 DIAGNOSIS — H35373 Puckering of macula, bilateral: Secondary | ICD-10-CM | POA: Diagnosis not present

## 2023-07-07 DIAGNOSIS — H4720 Unspecified optic atrophy: Secondary | ICD-10-CM | POA: Diagnosis not present

## 2023-07-07 LAB — HM DIABETES EYE EXAM

## 2023-07-11 ENCOUNTER — Encounter: Payer: Self-pay | Admitting: Internal Medicine

## 2023-07-11 ENCOUNTER — Ambulatory Visit: Payer: Medicare Other | Admitting: Internal Medicine

## 2023-07-11 VITALS — BP 112/66 | HR 72 | Temp 98.3°F | Ht 69.0 in | Wt 237.0 lb

## 2023-07-11 DIAGNOSIS — Z8669 Personal history of other diseases of the nervous system and sense organs: Secondary | ICD-10-CM | POA: Diagnosis not present

## 2023-07-11 DIAGNOSIS — R062 Wheezing: Secondary | ICD-10-CM

## 2023-07-11 DIAGNOSIS — E66812 Obesity, class 2: Secondary | ICD-10-CM | POA: Diagnosis not present

## 2023-07-11 DIAGNOSIS — R9430 Abnormal result of cardiovascular function study, unspecified: Secondary | ICD-10-CM | POA: Diagnosis not present

## 2023-07-11 DIAGNOSIS — Z6835 Body mass index (BMI) 35.0-35.9, adult: Secondary | ICD-10-CM

## 2023-07-11 DIAGNOSIS — Z87891 Personal history of nicotine dependence: Secondary | ICD-10-CM

## 2023-07-11 DIAGNOSIS — R0609 Other forms of dyspnea: Secondary | ICD-10-CM | POA: Diagnosis not present

## 2023-07-11 LAB — NITRIC OXIDE: Nitric Oxide: 17

## 2023-07-11 NOTE — Patient Instructions (Addendum)
 ICD-10-CM   1. Dyspnea on exertion  R06.09     2. Wheeze  R06.2     3. Stopped smoking with greater than 40 pack year history  Z87.891     4. History of smoking greater than 50 pack years  Z87.891     5. Class 2 severe obesity with serious comorbidity and body mass index (BMI) of 35.0 to 35.9 in adult, unspecified obesity type (HCC)  N82.956    E66.01    Z68.35     6. Elevated left ventricular end-diastolic pressure  R94.30     7. History of obstructive sleep apnea  Z86.69       Dyspnea on exertion Wheeze Stopped smoking with greater than 40 pack year history History of smoking greater than 50 pack years  -Differential diagnosis includes asthma, COPD, pulmonary fibrosis, your body weight, physical deconditioning and stiff heart muscle as reasons for shortness of breath  Plan - Do CBC differential and RAST allergy panel - do BNP blood work 07/11/2023 - Do full pulmonary function test - Do high-resolution CT chest supine and prone -Continue current inhaler of Stiolto and also your medicine of Singulair until your next visit and then we can reassess.  Class 2 severe obesity with serious comorbidity and body mass index (BMI) of 35.0 to 35.9 in adult, unspecified obesity type (HCC) History of obstructive sleep apnea    Plan  - refer Dr Vassie Loll or Dr Agustina Caroli for sleep apnea -Recommend talking to primary care physician and start one of the new weight loss drugs especially in the setting of diabetes.  Elevated left ventricular end-diastolic pressure  -There is evidence of stiff heart muscle on the recent echocardiogram December 2024 and this is definitely a cause for shortness of breath.  Follow-up - Nurse practitioner in 1-2 month but after completing all of the above tests. -Also referred to sleep doctor
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
## 2023-07-12 LAB — CBC WITH DIFFERENTIAL/PLATELET
Basophils Absolute: 0.1 10*3/uL (ref 0.0–0.1)
Basophils Relative: 1.1 % (ref 0.0–3.0)
Eosinophils Absolute: 0.1 10*3/uL (ref 0.0–0.7)
Eosinophils Relative: 1.4 % (ref 0.0–5.0)
HCT: 43.4 % (ref 39.0–52.0)
Hemoglobin: 14.6 g/dL (ref 13.0–17.0)
Lymphocytes Relative: 29.9 % (ref 12.0–46.0)
Lymphs Abs: 1.9 10*3/uL (ref 0.7–4.0)
MCHC: 33.6 g/dL (ref 30.0–36.0)
MCV: 87 fl (ref 78.0–100.0)
Monocytes Absolute: 0.6 10*3/uL (ref 0.1–1.0)
Monocytes Relative: 10 % (ref 3.0–12.0)
Neutro Abs: 3.6 10*3/uL (ref 1.4–7.7)
Neutrophils Relative %: 57.6 % (ref 43.0–77.0)
Platelets: 143 10*3/uL — ABNORMAL LOW (ref 150.0–400.0)
RBC: 4.99 Mil/uL (ref 4.22–5.81)
RDW: 16.1 % — ABNORMAL HIGH (ref 11.5–15.5)
WBC: 6.2 10*3/uL (ref 4.0–10.5)

## 2023-07-12 LAB — BRAIN NATRIURETIC PEPTIDE: Pro B Natriuretic peptide (BNP): 101 pg/mL — ABNORMAL HIGH (ref 0.0–100.0)

## 2023-07-13 ENCOUNTER — Telehealth: Payer: Self-pay | Admitting: Internal Medicine

## 2023-07-13 ENCOUNTER — Encounter: Payer: Self-pay | Admitting: Internal Medicine

## 2023-07-13 DIAGNOSIS — H469 Unspecified optic neuritis: Secondary | ICD-10-CM | POA: Diagnosis not present

## 2023-07-13 NOTE — Progress Notes (Signed)
 BNP slight high - suggesting stiff heart mucle playing a role in shortness of breath. Do talk to Dr Wyline Mood about lasix

## 2023-07-13 NOTE — Telephone Encounter (Signed)
 Pt calling because [facility] wanted to know if he can have MRI with the pacemaker he has. Per Barrington Ellison, RN he can not, I have spoke to husband and he verbal understood.

## 2023-07-14 DIAGNOSIS — M542 Cervicalgia: Secondary | ICD-10-CM | POA: Diagnosis not present

## 2023-07-14 DIAGNOSIS — M47896 Other spondylosis, lumbar region: Secondary | ICD-10-CM | POA: Diagnosis not present

## 2023-07-14 DIAGNOSIS — M791 Myalgia, unspecified site: Secondary | ICD-10-CM | POA: Diagnosis not present

## 2023-07-14 DIAGNOSIS — M47892 Other spondylosis, cervical region: Secondary | ICD-10-CM | POA: Diagnosis not present

## 2023-07-15 LAB — ALLERGEN PROFILE, PERENNIAL ALLERGEN IGE

## 2023-07-19 NOTE — Progress Notes (Signed)
 Remote ICD transmission.

## 2023-07-19 NOTE — Addendum Note (Signed)
 Addended by: Elease Etienne A on: 07/19/2023 12:15 PM   Modules accepted: Orders

## 2023-07-29 ENCOUNTER — Ambulatory Visit (HOSPITAL_COMMUNITY)
Admission: RE | Admit: 2023-07-29 | Discharge: 2023-07-29 | Disposition: A | Discharge status: Home / Self Care | Source: Ambulatory Visit | Attending: Internal Medicine | Admitting: Internal Medicine

## 2023-07-29 DIAGNOSIS — Z87891 Personal history of nicotine dependence: Secondary | ICD-10-CM | POA: Insufficient documentation

## 2023-07-29 DIAGNOSIS — Z8669 Personal history of other diseases of the nervous system and sense organs: Secondary | ICD-10-CM | POA: Diagnosis not present

## 2023-07-29 DIAGNOSIS — R9430 Abnormal result of cardiovascular function study, unspecified: Secondary | ICD-10-CM | POA: Insufficient documentation

## 2023-07-29 DIAGNOSIS — R0609 Other forms of dyspnea: Secondary | ICD-10-CM | POA: Insufficient documentation

## 2023-07-29 DIAGNOSIS — R062 Wheezing: Secondary | ICD-10-CM | POA: Diagnosis not present

## 2023-07-29 DIAGNOSIS — R0602 Shortness of breath: Secondary | ICD-10-CM | POA: Diagnosis not present

## 2023-07-29 DIAGNOSIS — E66812 Obesity, class 2: Secondary | ICD-10-CM | POA: Diagnosis present

## 2023-07-29 DIAGNOSIS — Z6835 Body mass index (BMI) 35.0-35.9, adult: Secondary | ICD-10-CM | POA: Diagnosis present

## 2023-07-29 DIAGNOSIS — J432 Centrilobular emphysema: Secondary | ICD-10-CM | POA: Diagnosis not present

## 2023-08-01 ENCOUNTER — Encounter: Payer: Self-pay | Admitting: Emergency Medicine

## 2023-08-01 ENCOUNTER — Ambulatory Visit
Admission: EM | Admit: 2023-08-01 | Discharge: 2023-08-01 | Disposition: A | Discharge status: Home / Self Care | Attending: Family Medicine | Admitting: Family Medicine

## 2023-08-01 ENCOUNTER — Ambulatory Visit: Payer: Medicare Other | Admitting: Urology

## 2023-08-01 ENCOUNTER — Other Ambulatory Visit: Payer: Self-pay

## 2023-08-01 DIAGNOSIS — J441 Chronic obstructive pulmonary disease with (acute) exacerbation: Secondary | ICD-10-CM | POA: Diagnosis not present

## 2023-08-01 DIAGNOSIS — H66001 Acute suppurative otitis media without spontaneous rupture of ear drum, right ear: Secondary | ICD-10-CM

## 2023-08-01 LAB — POC COVID19/FLU A&B COMBO
Covid Antigen, POC: NEGATIVE
Influenza A Antigen, POC: NEGATIVE
Influenza B Antigen, POC: NEGATIVE

## 2023-08-01 MED ORDER — AZITHROMYCIN 250 MG PO TABS: ORAL_TABLET | ORAL | 0 refills | Status: DC

## 2023-08-01 MED ORDER — PREDNISONE 20 MG PO TABS: 40.0000 mg | ORAL_TABLET | Freq: Every day | ORAL | 0 refills | Status: AC

## 2023-08-01 MED ORDER — FLUTICASONE PROPIONATE 50 MCG/ACT NA SUSP: 1.0000 | Freq: Every day | NASAL | 0 refills | Status: DC
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
## 2023-08-01 NOTE — Discharge Instructions (Addendum)
 You are having an exacerbation of COPD due allergies or another type of infection.  COVID-19 and influenza test is negative today.  Start using the albuterol inhaler every 4-6 hours scheduled for the next 2 days, then use as needed.  Start taking the oral prednisone to help with lung inflammation.  In addition, recommend taking azithromycin to treat for atypical infection in your lungs, this will also treat the ear infection.  Start Flonase nasal spray to treat allergy symptoms.  Symptoms should improve over the next few days.  If symptoms worsen despite treatment, please return for reevaluation or follow-up in the ER.

## 2023-08-01 NOTE — ED Triage Notes (Signed)
 Pt reports productive cough, runny nose since Friday.

## 2023-08-02 ENCOUNTER — Encounter (HOSPITAL_BASED_OUTPATIENT_CLINIC_OR_DEPARTMENT_OTHER)

## 2023-08-08 ENCOUNTER — Other Ambulatory Visit: Payer: Self-pay | Admitting: Nurse Practitioner

## 2023-08-11 ENCOUNTER — Encounter: Payer: Self-pay | Admitting: Gastroenterology

## 2023-08-12 ENCOUNTER — Encounter: Payer: Self-pay | Admitting: *Deleted

## 2023-08-15 ENCOUNTER — Encounter: Payer: Self-pay | Admitting: Cardiology

## 2023-08-15 ENCOUNTER — Ambulatory Visit: Payer: Medicare Other | Attending: Cardiology | Admitting: Cardiology

## 2023-08-15 VITALS — BP 132/75 | HR 69 | Ht 69.0 in | Wt 234.4 lb

## 2023-08-15 DIAGNOSIS — E782 Mixed hyperlipidemia: Secondary | ICD-10-CM | POA: Diagnosis not present

## 2023-08-15 DIAGNOSIS — I2510 Atherosclerotic heart disease of native coronary artery without angina pectoris: Secondary | ICD-10-CM | POA: Diagnosis not present

## 2023-08-15 DIAGNOSIS — I10 Essential (primary) hypertension: Secondary | ICD-10-CM | POA: Diagnosis not present
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
## 2023-08-15 NOTE — Patient Instructions (Signed)
 Medication Instructions:  Continue all current medications.   Labwork: none  Testing/Procedures: none  Follow-Up: 6 months   Any Other Special Instructions Will Be Listed Below (If Applicable).   If you need a refill on your cardiac medications before your next appointment, please call your pharmacy.

## 2023-08-24 ENCOUNTER — Ambulatory Visit: Admitting: Primary Care

## 2023-08-30 ENCOUNTER — Other Ambulatory Visit: Payer: Self-pay | Admitting: Cardiology

## 2023-09-01 DIAGNOSIS — Z794 Long term (current) use of insulin: Secondary | ICD-10-CM | POA: Diagnosis not present

## 2023-09-01 DIAGNOSIS — I2510 Atherosclerotic heart disease of native coronary artery without angina pectoris: Secondary | ICD-10-CM | POA: Diagnosis not present

## 2023-09-01 DIAGNOSIS — E1142 Type 2 diabetes mellitus with diabetic polyneuropathy: Secondary | ICD-10-CM | POA: Diagnosis not present

## 2023-09-01 DIAGNOSIS — E1165 Type 2 diabetes mellitus with hyperglycemia: Secondary | ICD-10-CM | POA: Diagnosis not present

## 2023-09-05 ENCOUNTER — Ambulatory Visit: Admitting: Urology

## 2023-09-05 VITALS — BP 133/59 | HR 83

## 2023-09-05 DIAGNOSIS — N281 Cyst of kidney, acquired: Secondary | ICD-10-CM | POA: Diagnosis not present

## 2023-09-05 DIAGNOSIS — N401 Enlarged prostate with lower urinary tract symptoms: Secondary | ICD-10-CM | POA: Diagnosis not present

## 2023-09-05 DIAGNOSIS — R3912 Poor urinary stream: Secondary | ICD-10-CM | POA: Diagnosis not present

## 2023-09-05 DIAGNOSIS — N5201 Erectile dysfunction due to arterial insufficiency: Secondary | ICD-10-CM

## 2023-09-05 DIAGNOSIS — R7989 Other specified abnormal findings of blood chemistry: Secondary | ICD-10-CM | POA: Diagnosis not present

## 2023-09-05 DIAGNOSIS — R350 Frequency of micturition: Secondary | ICD-10-CM | POA: Diagnosis not present

## 2023-09-05 DIAGNOSIS — N200 Calculus of kidney: Secondary | ICD-10-CM

## 2023-09-05 LAB — BLADDER SCAN AMB NON-IMAGING: Scan Result: 178

## 2023-09-05 LAB — URINALYSIS, ROUTINE W REFLEX MICROSCOPIC
Bilirubin, UA: NEGATIVE
Ketones, UA: NEGATIVE
Leukocytes,UA: NEGATIVE
Nitrite, UA: NEGATIVE
Protein,UA: NEGATIVE
RBC, UA: NEGATIVE
Specific Gravity, UA: <1.005 — ABNORMAL LOW (ref 1.005–1.030)
Urobilinogen, Ur: 0.2 mg/dL (ref 0.2–1.0)
pH, UA: 6 (ref 5.0–7.5)

## 2023-09-05 MED ORDER — ALFUZOSIN HCL ER 10 MG PO TB24: 10.0000 mg | ORAL_TABLET | Freq: Every day | ORAL | 3 refills | Status: AC

## 2023-09-05 MED ORDER — SILDENAFIL CITRATE 20 MG PO TABS: ORAL_TABLET | ORAL | 11 refills | Status: DC
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
## 2023-09-05 NOTE — Progress Notes (Signed)
 Bladder Scan completed today.  Patient can void prior to the bladder scan. Bladder scan result: [id number]  Performed By: Melvenia Stabs. CMA  Additional notes-

## 2023-09-06 ENCOUNTER — Ambulatory Visit: Admitting: Internal Medicine

## 2023-09-06 ENCOUNTER — Encounter: Payer: Self-pay | Admitting: Internal Medicine

## 2023-09-06 VITALS — BP 122/71 | HR 69 | Temp 97.5°F | Ht 69.0 in | Wt 236.8 lb

## 2023-09-06 DIAGNOSIS — R140 Abdominal distension (gaseous): Secondary | ICD-10-CM

## 2023-09-06 DIAGNOSIS — K219 Gastro-esophageal reflux disease without esophagitis: Secondary | ICD-10-CM
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
## 2023-09-06 NOTE — Patient Instructions (Signed)
 It was good to see you again today!  Continue Protonix  40 mg once daily.  May continue to use Bentyl  or dicyclomine  on an as-needed basis  Follow-up with Dr. Charlotta Cook regarding your mood as discussed (I will  also reach out to him.   office visit here in 6 months.

## 2023-09-08 ENCOUNTER — Telehealth: Payer: Self-pay | Admitting: Family Medicine

## 2023-09-08 NOTE — Telephone Encounter (Signed)
 It appears that patient is having little energy and possibly depression her message from Dr.Rourke-patient not suicidal-patient has office visit later in May we will address that at that time

## 2023-09-09 DIAGNOSIS — M65332 Trigger finger, left middle finger: Secondary | ICD-10-CM | POA: Diagnosis not present

## 2023-09-09 DIAGNOSIS — M65311 Trigger thumb, right thumb: Secondary | ICD-10-CM | POA: Diagnosis not present

## 2023-09-12 ENCOUNTER — Other Ambulatory Visit: Payer: Self-pay | Admitting: Family Medicine

## 2023-09-12 ENCOUNTER — Other Ambulatory Visit: Payer: Self-pay

## 2023-09-12 DIAGNOSIS — G894 Chronic pain syndrome: Secondary | ICD-10-CM | POA: Diagnosis not present

## 2023-09-12 MED ORDER — MONTELUKAST SODIUM 10 MG PO TABS: 10.0000 mg | ORAL_TABLET | Freq: Every day | ORAL | 2 refills | Status: DC

## 2023-09-14 ENCOUNTER — Ambulatory Visit: Payer: Self-pay

## 2023-09-14 LAB — PSA: Prostate Specific Ag, Serum: 2.2 ng/mL (ref 0.0–4.0)

## 2023-09-14 LAB — ESTRADIOL, FREE
Estradiol, Serum, MS: 42 pg/mL — ABNORMAL HIGH
Free Estradiol, Percent: 2.3 %
Free Estradiol, Serum: 0.97 pg/mL

## 2023-09-14 LAB — TESTOSTERONE: Testosterone: 531 ng/dL (ref 264–916)

## 2023-09-15 ENCOUNTER — Ambulatory Visit (INDEPENDENT_AMBULATORY_CARE_PROVIDER_SITE_OTHER): Payer: Medicare Other

## 2023-09-15 DIAGNOSIS — I255 Ischemic cardiomyopathy: Secondary | ICD-10-CM

## 2023-09-15 LAB — CUP PACEART REMOTE DEVICE CHECK
Battery Remaining Longevity: 80 mo
Battery Remaining Percentage: 81 %
Battery Voltage: 3.01 V
Brady Statistic AP VP Percent: 1 %
Brady Statistic AP VS Percent: 1 %
Brady Statistic AS VP Percent: 1 %
Brady Statistic AS VS Percent: 99 %
Brady Statistic RA Percent Paced: 1 %
Brady Statistic RV Percent Paced: 1 %
Date Time Interrogation Session: 20250515031158
HighPow Impedance: 61 Ohm
HighPow Impedance: 61 Ohm
Implantable Lead Connection Status: 753985
Implantable Lead Connection Status: 753985
Implantable Lead Implant Date: 20050706
Implantable Lead Implant Date: 20161128
Implantable Lead Location: 753859
Implantable Lead Location: 753860
Implantable Lead Model: 7122
Implantable Pulse Generator Implant Date: 20230518
Lead Channel Impedance Value: 380 Ohm
Lead Channel Impedance Value: 380 Ohm
Lead Channel Pacing Threshold Amplitude: 1 V
Lead Channel Pacing Threshold Amplitude: 1 V
Lead Channel Pacing Threshold Pulse Width: 0.5 ms
Lead Channel Pacing Threshold Pulse Width: 0.5 ms
Lead Channel Sensing Intrinsic Amplitude: 10.6 mV
Lead Channel Sensing Intrinsic Amplitude: 2.6 mV
Lead Channel Setting Pacing Amplitude: 2 V
Lead Channel Setting Pacing Amplitude: 2.5 V
Lead Channel Setting Pacing Pulse Width: 0.5 ms
Lead Channel Setting Sensing Sensitivity: 0.5 mV
Pulse Gen Serial Number: 8942717

## 2023-09-19 ENCOUNTER — Ambulatory Visit: Payer: Self-pay | Admitting: Internal Medicine

## 2023-09-20 ENCOUNTER — Ambulatory Visit: Admitting: Pulmonary Disease

## 2023-09-27 ENCOUNTER — Ambulatory Visit (HOSPITAL_COMMUNITY)

## 2023-09-27 DIAGNOSIS — G5603 Carpal tunnel syndrome, bilateral upper limbs: Secondary | ICD-10-CM | POA: Diagnosis not present

## 2023-09-28 DIAGNOSIS — H469 Unspecified optic neuritis: Secondary | ICD-10-CM | POA: Diagnosis not present

## 2023-09-29 ENCOUNTER — Ambulatory Visit: Payer: Medicare Other | Admitting: Family Medicine

## 2023-09-29 VITALS — BP 114/70 | HR 71 | Temp 97.5°F | Ht 69.0 in | Wt 238.0 lb

## 2023-09-29 DIAGNOSIS — E1159 Type 2 diabetes mellitus with other circulatory complications: Secondary | ICD-10-CM | POA: Diagnosis not present

## 2023-09-29 DIAGNOSIS — I10 Essential (primary) hypertension: Secondary | ICD-10-CM | POA: Diagnosis not present

## 2023-09-29 DIAGNOSIS — G894 Chronic pain syndrome: Secondary | ICD-10-CM | POA: Diagnosis not present

## 2023-09-29 DIAGNOSIS — E113512 Type 2 diabetes mellitus with proliferative diabetic retinopathy with macular edema, left eye: Secondary | ICD-10-CM | POA: Diagnosis not present

## 2023-09-29 DIAGNOSIS — F419 Anxiety disorder, unspecified: Secondary | ICD-10-CM | POA: Diagnosis not present

## 2023-09-29 DIAGNOSIS — E1142 Type 2 diabetes mellitus with diabetic polyneuropathy: Secondary | ICD-10-CM | POA: Diagnosis not present

## 2023-09-29 MED ORDER — HYDROCODONE-ACETAMINOPHEN 7.5-325 MG PO TABS: ORAL_TABLET | ORAL | 0 refills | Status: DC

## 2023-09-29 MED ORDER — ALPRAZOLAM 0.5 MG PO TABS: ORAL_TABLET | ORAL | 2 refills | Status: DC

## 2023-09-29 NOTE — Progress Notes (Signed)
 Subjective:    Patient ID: Lance [name], male    DOB: [birth year], 78 y.o.   MRN: [id number]  HPI Pt comes in today for 4 month follow up,Pt has form from [employer] for placard that he would like signed. Medications and allergies reviewed. Discussed the use of AI scribe software for clinical note transcription with the patient, who gave verbal consent to proceed.  History of Present Illness   Lance [name] is a 78 year old male who presents with low energy and vision issues.  He experiences persistent low energy, characterized by a lack of motivation and feeling completely exhausted during any activity. Despite pacing himself, there is no improvement in his energy levels. His blood sugar levels have been stable according to recent blood work.  He has vision problems, specifically an inability to see out of his left eye. An MRI has been scheduled for further investigation.  He experiences pain in his fingers, particularly his thumb, which tends to lock. Injections at the base of his thumbs have provided some relief, and he takes pain medication as needed, usually half a pill, to manage discomfort.  His mood is generally stable, though he takes Xanax  for nerve-related issues, varying between one to two pills a day depending on his needs, without experiencing drowsiness or feeling 'loopy.'  He reports numbness in his feet, describing them as feeling 'dead.' He visits a podiatrist every two months for nail care.      This patient was seen today for chronic pain  The medication list was reviewed and updated.   Location of Pain for which the patient has been treated with regarding narcotics: Lumbar pain  Onset of this pain: Present for years   -Compliance with medication: Good compliance  - Number patient states they take daily: Some days 1 some days 2  -Reason for ongoing use of opioids Tylenol  alone does not help enough  What other measures have been tried outside of opioids Tylenol ,  cannot take NSAIDs  In the ongoing specialists regarding this condition back specialist  -when was the last dose patient took?  2 days ago  The patient was advised the importance of maintaining medication and not using illegal substances with these.  Here for refills and follow up  The patient was educated that we can provide 3 monthly scripts for their medication, it is their responsibility to follow the instructions.  Side effects or complications from medications: Denies side effects  Patient is aware that pain medications are meant to minimize the severity of the pain to allow their pain levels to improve to allow for better function. They are aware of that pain medications cannot totally remove their pain.  Due for UDT ( at least once per year) (pain management contract is also completed at the time of the UDT): Due on next visit  Scale of 1 to 10 ( 1 is least 10 is most) Your pain level without the medicine: 7 or 8 Your pain level with medication 4  Scale 1 to 10 ( 1-helps very little, 10 helps very well) How well does your pain medication reduce your pain so you can function better through out the day?  8  Quality of the pain: Throbbing  Persistence of the pain: Present all time  Modifying factors: Worse with activity       Review of Systems     Objective:   Physical Exam  General-in no acute distress Eyes-no discharge Lungs-respiratory rate normal, CTA CV-no  murmurs,RRR Extremities skin warm dry no edema Neuro grossly normal Behavior normal, alert       Assessment & Plan:  Assessment and Plan    Vision loss in left eye Complete vision loss in the left eye, likely due to a vascular event similar to a stroke. MRI scheduled for further investigation. - Await MRI results to determine cause of vision loss.  Diabetes mellitus Diabetes well-managed with Hemoglobin A1c improved to 8.1. Reports low energy but no specific concerns related to diabetes  management. - Send message to Dr. Kathyanne Parkers to update on current status and request recent blood work. - Renew medications for next three months.  Peripheral neuropathy Persistent numbness in feet, described as feeling 'dead'. Regular podiatry visits every two months for nail care and foot evaluation. - Continue regular podiatry visits every two months for nail care and foot evaluation.  Trigger finger Experiences locking of thumb and fingers, treated with injections at base of thumbs, providing temporary relief.     1. Essential hypertension (Primary) Blood pressure good control continue current measures  2. Anxiety Has been on Xanax  for years we have tapered him down to 0.5 twice daily some days he only takes 1-1/2 tablets a day some days 1 tablet a day will give him refills follow-up 4 months  3. Chronic pain syndrome The patient was seen in followup for chronic pain. A review over at their current pain status was discussed. Drug registry was checked. Prescriptions were given.  Regular follow-up recommended. Discussion was held regarding the importance of compliance with medication as well as pain medication contract.  Patient was informed that medication may cause drowsiness and should not be combined  with other medications/alcohol or street drugs. If the patient feels medication is causing altered alertness then do not drive or operate dangerous equipment.  Should be noted that the patient appears to be meeting appropriate use of opioids and response.  Evidenced by improved function and decent pain control without significant side effects and no evidence of overt aberrancy issues.  Upon discussion with the patient today they understand that opioid therapy is optional and they feel that the pain has been refractory to reasonable conservative measures and is significant and affecting quality of life enough to warrant ongoing therapy and wishes to continue opioids.  Refills were  provided.  [place]  medical Board guidelines regarding the pain medicine has been reviewed.  CDC guidelines most updated 2022 has been reviewed by the prescriber.  PDMP is checked on a regular basis yearly urine drug screen and pain management contract  Treatment plan for this patient includes #1-gentle stretching exercises as shown daily basis 2.  Mild strength exercises 3 times per week #3 continue pain medications #4 notify us  if any digression  Urine drug screen on next visit  4. Type 2 diabetes mellitus with vascular disease (HCC) Sees Dr.Kerr he states most recent A1c 8.1   Patient with vascular issues with his eye being followed by eye specialist for diabetes Patient with COPD aspects lung breathing stable currently

## 2023-09-30 NOTE — Telephone Encounter (Signed)
 Called pt and given results per MD. Pt was notified originally via mychart but never seen results pt made aware of results and pt voiced understanding

## 2023-10-04 DIAGNOSIS — M65332 Trigger finger, left middle finger: Secondary | ICD-10-CM | POA: Diagnosis not present

## 2023-10-04 DIAGNOSIS — M65311 Trigger thumb, right thumb: Secondary | ICD-10-CM | POA: Diagnosis not present

## 2023-10-06 ENCOUNTER — Ambulatory Visit (HOSPITAL_COMMUNITY)
Admission: RE | Admit: 2023-10-06 | Discharge: 2023-10-06 | Disposition: A | Discharge status: Home / Self Care | Source: Ambulatory Visit | Attending: Internal Medicine | Admitting: Internal Medicine

## 2023-10-06 DIAGNOSIS — Z6835 Body mass index (BMI) 35.0-35.9, adult: Secondary | ICD-10-CM | POA: Insufficient documentation

## 2023-10-06 DIAGNOSIS — R062 Wheezing: Secondary | ICD-10-CM | POA: Diagnosis not present

## 2023-10-06 DIAGNOSIS — R9430 Abnormal result of cardiovascular function study, unspecified: Secondary | ICD-10-CM | POA: Insufficient documentation

## 2023-10-06 DIAGNOSIS — E66812 Obesity, class 2: Secondary | ICD-10-CM | POA: Insufficient documentation

## 2023-10-06 DIAGNOSIS — Z87891 Personal history of nicotine dependence: Secondary | ICD-10-CM | POA: Insufficient documentation

## 2023-10-06 DIAGNOSIS — R0609 Other forms of dyspnea: Secondary | ICD-10-CM | POA: Diagnosis not present

## 2023-10-06 DIAGNOSIS — Z8669 Personal history of other diseases of the nervous system and sense organs: Secondary | ICD-10-CM | POA: Diagnosis not present

## 2023-10-06 LAB — PULMONARY FUNCTION TEST
DL/VA % pred: 109 %
DL/VA: 4.3 ml/min/mmHg/L
DLCO unc % pred: 83 %
DLCO unc: 20.15 ml/min/mmHg
FEF 25-75 Post: 2.04 L/s
FEF 25-75 Pre: 1.98 L/s
FEF2575-%Change-Post: 2 %
FEF2575-%Pred-Post: 95 %
FEF2575-%Pred-Pre: 93 %
FEV1-%Change-Post: -2 %
FEV1-%Pred-Post: 84 %
FEV1-%Pred-Pre: 87 %
FEV1-Post: 2.5 L
FEV1-Pre: 2.56 L
FEV1FVC-%Change-Post: 1 %
FEV1FVC-%Pred-Pre: 106 %
FEV6-%Change-Post: -3 %
FEV6-%Pred-Post: 83 %
FEV6-%Pred-Pre: 86 %
FEV6-Post: 3.17 L
FEV6-Pre: 3.29 L
FEV6FVC-%Change-Post: 0 %
FEV6FVC-%Pred-Post: 106 %
FEV6FVC-%Pred-Pre: 105 %
FVC-%Change-Post: -4 %
FVC-%Pred-Post: 78 %
FVC-%Pred-Pre: 81 %
FVC-Post: 3.19 L
FVC-Pre: 3.32 L
Post FEV1/FVC ratio: 78 %
Post FEV6/FVC ratio: 99 %
Pre FEV1/FVC ratio: 77 %
Pre FEV6/FVC Ratio: 99 %
RV % pred: 114 %
RV: 2.85 L
TLC % pred: 92 %
TLC: 6.32 L

## 2023-10-06 MED ORDER — ALBUTEROL SULFATE (2.5 MG/3ML) 0.083% IN NEBU
2.5000 mg | INHALATION_SOLUTION | Freq: Once | RESPIRATORY_TRACT | Status: AC
  Administered 2023-10-06: 2.5 mg via RESPIRATORY_TRACT

## 2023-10-10 ENCOUNTER — Other Ambulatory Visit (HOSPITAL_COMMUNITY): Payer: Self-pay | Admitting: Neurology

## 2023-10-10 DIAGNOSIS — Z8673 Personal history of transient ischemic attack (TIA), and cerebral infarction without residual deficits: Secondary | ICD-10-CM

## 2023-10-10 DIAGNOSIS — R42 Dizziness and giddiness: Secondary | ICD-10-CM

## 2023-10-10 DIAGNOSIS — H469 Unspecified optic neuritis: Secondary | ICD-10-CM

## 2023-10-13 DIAGNOSIS — Z4789 Encounter for other orthopedic aftercare: Secondary | ICD-10-CM | POA: Diagnosis not present

## 2023-10-13 DIAGNOSIS — G5601 Carpal tunnel syndrome, right upper limb: Secondary | ICD-10-CM | POA: Diagnosis not present

## 2023-10-25 DIAGNOSIS — G5601 Carpal tunnel syndrome, right upper limb: Secondary | ICD-10-CM | POA: Diagnosis not present

## 2023-10-25 NOTE — Progress Notes (Signed)
 Remote ICD transmission.

## 2023-10-26 ENCOUNTER — Ambulatory Visit (HOSPITAL_COMMUNITY)
Admission: RE | Admit: 2023-10-26 | Discharge: 2023-10-26 | Disposition: A | Discharge status: Home / Self Care | Source: Ambulatory Visit | Attending: Neurology | Admitting: Neurology

## 2023-10-26 ENCOUNTER — Other Ambulatory Visit: Payer: Self-pay | Admitting: Gastroenterology

## 2023-10-26 ENCOUNTER — Other Ambulatory Visit (HOSPITAL_COMMUNITY): Payer: Self-pay | Admitting: Neurology

## 2023-10-26 DIAGNOSIS — I672 Cerebral atherosclerosis: Secondary | ICD-10-CM | POA: Diagnosis not present

## 2023-10-26 DIAGNOSIS — I6529 Occlusion and stenosis of unspecified carotid artery: Secondary | ICD-10-CM | POA: Diagnosis not present

## 2023-10-26 DIAGNOSIS — H469 Unspecified optic neuritis: Secondary | ICD-10-CM

## 2023-10-26 DIAGNOSIS — Z8673 Personal history of transient ischemic attack (TIA), and cerebral infarction without residual deficits: Secondary | ICD-10-CM

## 2023-10-26 DIAGNOSIS — R42 Dizziness and giddiness: Secondary | ICD-10-CM

## 2023-10-26 DIAGNOSIS — I6782 Cerebral ischemia: Secondary | ICD-10-CM | POA: Diagnosis not present

## 2023-10-26 DIAGNOSIS — I6503 Occlusion and stenosis of bilateral vertebral arteries: Secondary | ICD-10-CM | POA: Diagnosis not present

## 2023-10-26 DIAGNOSIS — G629 Polyneuropathy, unspecified: Secondary | ICD-10-CM | POA: Diagnosis not present

## 2023-10-26 LAB — POCT I-STAT CREATININE: Creatinine, Ser: 1.2 mg/dL (ref 0.61–1.24)

## 2023-10-26 MED ORDER — IOHEXOL 350 MG/ML SOLN
75.0000 mL | Freq: Once | INTRAVENOUS | Status: AC | PRN
  Administered 2023-10-26: 75 mL via INTRAVENOUS

## 2023-11-12 ENCOUNTER — Other Ambulatory Visit: Payer: Self-pay | Admitting: Internal Medicine

## 2023-11-12 ENCOUNTER — Other Ambulatory Visit: Payer: Self-pay | Admitting: Family Medicine

## 2023-11-14 ENCOUNTER — Emergency Department (HOSPITAL_COMMUNITY)

## 2023-11-14 ENCOUNTER — Emergency Department (HOSPITAL_COMMUNITY)
Admission: EM | Admit: 2023-11-14 | Discharge: 2023-11-14 | Disposition: A | Discharge status: Home / Self Care | Source: Other Acute Inpatient Hospital | Attending: Emergency Medicine | Admitting: Emergency Medicine

## 2023-11-14 ENCOUNTER — Other Ambulatory Visit: Payer: Self-pay

## 2023-11-14 DIAGNOSIS — M542 Cervicalgia: Secondary | ICD-10-CM | POA: Diagnosis not present

## 2023-11-14 DIAGNOSIS — Z7902 Long term (current) use of antithrombotics/antiplatelets: Secondary | ICD-10-CM | POA: Insufficient documentation

## 2023-11-14 DIAGNOSIS — Z91040 Latex allergy status: Secondary | ICD-10-CM | POA: Diagnosis not present

## 2023-11-14 DIAGNOSIS — R42 Dizziness and giddiness: Secondary | ICD-10-CM | POA: Diagnosis present

## 2023-11-14 DIAGNOSIS — W19XXXA Unspecified fall, initial encounter: Secondary | ICD-10-CM | POA: Diagnosis not present

## 2023-11-14 DIAGNOSIS — K8020 Calculus of gallbladder without cholecystitis without obstruction: Secondary | ICD-10-CM | POA: Diagnosis not present

## 2023-11-14 DIAGNOSIS — S01311A Laceration without foreign body of right ear, initial encounter: Secondary | ICD-10-CM | POA: Diagnosis not present

## 2023-11-14 DIAGNOSIS — R55 Syncope and collapse: Secondary | ICD-10-CM | POA: Insufficient documentation

## 2023-11-14 DIAGNOSIS — G894 Chronic pain syndrome: Secondary | ICD-10-CM | POA: Diagnosis not present

## 2023-11-14 DIAGNOSIS — W1830XA Fall on same level, unspecified, initial encounter: Secondary | ICD-10-CM | POA: Insufficient documentation

## 2023-11-14 DIAGNOSIS — R7989 Other specified abnormal findings of blood chemistry: Secondary | ICD-10-CM | POA: Diagnosis not present

## 2023-11-14 DIAGNOSIS — R58 Hemorrhage, not elsewhere classified: Secondary | ICD-10-CM | POA: Diagnosis not present

## 2023-11-14 DIAGNOSIS — M47812 Spondylosis without myelopathy or radiculopathy, cervical region: Secondary | ICD-10-CM | POA: Diagnosis not present

## 2023-11-14 DIAGNOSIS — I700 Atherosclerosis of aorta: Secondary | ICD-10-CM | POA: Diagnosis not present

## 2023-11-14 LAB — COMPREHENSIVE METABOLIC PANEL WITH GFR
ALT: 24 U/L (ref 0–44)
AST: 25 U/L (ref 15–41)
Albumin: 3.5 g/dL (ref 3.5–5.0)
Alkaline Phosphatase: 48 U/L (ref 38–126)
Anion gap: 11 (ref 5–15)
BUN: 12 mg/dL (ref 8–23)
CO2: 22 mmol/L (ref 22–32)
Calcium: 9.3 mg/dL (ref 8.9–10.3)
Chloride: 105 mmol/L (ref 98–111)
Creatinine, Ser: 1.21 mg/dL (ref 0.61–1.24)
GFR, Estimated: >60 mL/min (ref >60)
Glucose, Bld: 170 mg/dL — ABNORMAL HIGH (ref 70–99)
Potassium: 4 mmol/L (ref 3.5–5.1)
Sodium: 138 mmol/L (ref 135–145)
Total Bilirubin: 0.6 mg/dL (ref 0.0–1.2)
Total Protein: 6.2 g/dL — ABNORMAL LOW (ref 6.5–8.1)

## 2023-11-14 LAB — CBC WITH DIFFERENTIAL/PLATELET
Abs Immature Granulocytes: 0.02 K/uL (ref 0.00–0.07)
Basophils Absolute: 0 K/uL (ref 0.0–0.1)
Basophils Relative: 1 %
Eosinophils Absolute: 0.1 K/uL (ref 0.0–0.5)
Eosinophils Relative: 1 %
HCT: 43.5 % (ref 39.0–52.0)
Hemoglobin: 13.9 g/dL (ref 13.0–17.0)
Immature Granulocytes: 0 %
Lymphocytes Relative: 22 %
Lymphs Abs: 1.2 K/uL (ref 0.7–4.0)
MCH: 28.4 pg (ref 26.0–34.0)
MCHC: 32 g/dL (ref 30.0–36.0)
MCV: 88.8 fL (ref 80.0–100.0)
Monocytes Absolute: 0.5 K/uL (ref 0.1–1.0)
Monocytes Relative: 9 %
Neutro Abs: 3.8 K/uL (ref 1.7–7.7)
Neutrophils Relative %: 67 %
Platelets: UNDETERMINED K/uL (ref 150–400)
RBC: 4.9 MIL/uL (ref 4.22–5.81)
RDW: 14.7 % (ref 11.5–15.5)
WBC: 5.7 K/uL (ref 4.0–10.5)
nRBC: 0 % (ref 0.0–0.2)

## 2023-11-14 LAB — I-STAT CHEM 8, ED
BUN: 14 mg/dL (ref 8–23)
Calcium, Ion: 1.13 mmol/L — ABNORMAL LOW (ref 1.15–1.40)
Chloride: 107 mmol/L (ref 98–111)
Creatinine, Ser: 1.2 mg/dL (ref 0.61–1.24)
Glucose, Bld: 171 mg/dL — ABNORMAL HIGH (ref 70–99)
HCT: 41 % (ref 39.0–52.0)
Hemoglobin: 13.9 g/dL (ref 13.0–17.0)
Potassium: 4 mmol/L (ref 3.5–5.1)
Sodium: 140 mmol/L (ref 135–145)
TCO2: 22 mmol/L (ref 22–32)

## 2023-11-14 LAB — TROPONIN I (HIGH SENSITIVITY)
Troponin I (High Sensitivity): 6 ng/L (ref <18)
Troponin I (High Sensitivity): 8 ng/L (ref <18)

## 2023-11-14 LAB — D-DIMER, QUANTITATIVE: D-Dimer, Quant: 0.77 ug{FEU}/mL — ABNORMAL HIGH (ref 0.00–0.50)

## 2023-11-14 MED ORDER — IOHEXOL 350 MG/ML SOLN
75.0000 mL | Freq: Once | INTRAVENOUS | Status: AC | PRN
  Administered 2023-11-14: 75 mL via INTRAVENOUS

## 2023-11-14 MED ORDER — LACTATED RINGERS IV BOLUS
1000.0000 mL | Freq: Once | INTRAVENOUS | Status: AC
  Administered 2023-11-14: 1000 mL via INTRAVENOUS

## 2023-11-14 MED ORDER — CLOPIDOGREL BISULFATE 75 MG PO TABS: 75.0000 mg | ORAL_TABLET | Freq: Every day | ORAL | 2 refills | Status: AC

## 2023-11-14 MED ORDER — MEMANTINE HCL 10 MG PO TABS: 10.0000 mg | ORAL_TABLET | Freq: Two times a day (BID) | ORAL | 5 refills | Status: DC

## 2023-11-14 NOTE — ED Triage Notes (Signed)
 Pt BIB [facility] from [facility] d/t a syncopal event after walking into the restroom to give a UA. Pt reports feeling SOB before he then woke up on the floor with a lac to the Rt ear. Is very pale upon arrival to ED, lac to ear bleeding stopped. Takes Plavix  for Hx of CABG, 136/84, 98% on RA, 18 resp, 78 bpm, CBG 143.

## 2023-11-14 NOTE — ED Provider Notes (Signed)
 [place] EMERGENCY DEPARTMENT AT [facility] Provider Note   CSN: [id number] Arrival date & time: 11/14/23  9094     Patient presents with: No chief complaint on file.   Lance [name] is a 78 y.o. male.   HPI 78 year old male presents with fall/syncope and ear trauma.  The patient states that he has been chronically short of breath and weak for a few years.  However when he woke up this morning he was feeling worse than normal.  He has been feeling dizzy and all over weak/no energy.  He went to his previously scheduled doctor appointment this morning and started to feel worse and worse.  He states he was sweating at 1 point.  He had to go to the bathroom for a urine sample and then when he was in the bathroom he passed out and woke up on the floor.  Patient states while he was in the doctor's office he had some transient right sided chest and abdominal pain that is no longer present.  It was an abnormal sensation that was hard to describe.  He is currently feeling nauseated after waking up from the fall.  He does not feel dizzy any longer but is still feeling overall poorly.  He denies a current headache but has been having some on and off left-sided temporal headaches for the past couple weeks.  He has been having progressively worsening vision changes for over a year but no new vision changes in his left eye at this time.  No new neck pain or trauma.  Prior to Admission medications   Medication Sig Start Date End Date Taking? Authorizing Provider  alfuzosin  (UROXATRAL ) 10 MG 24 hr tablet Take 1 tablet (10 mg total) by mouth daily with breakfast. 09/05/23   Nieves Cough, MD  ALPRAZolam  (XANAX ) 0.5 MG tablet May use 1 no greater than twice daily caution drowsiness 09/29/23   Luking, Glendia DELENA, MD  clomiPHENE  (CLOMID ) 50 MG tablet Take 0.5 tablets (25 mg total) by mouth daily. 05/12/23   Nieves Cough, MD  clopidogrel  (PLAVIX ) 75 MG tablet Take 1 tablet (75 mg total) by mouth  daily. 11/14/23   Alphonsa Glendia DELENA, MD  Coenzyme Q10-Vitamin E (QUNOL ULTRA COQ10 PO) Take 1 capsule by mouth daily.    [provider]  Continuous Glucose Sensor (FREESTYLE LIBRE 3 PLUS SENSOR) MISC SMARTSIG:1 Topical Once a Week 02/21/23   [provider]  donepezil  (ARICEPT ) 10 MG tablet Take 1 tablet (10 mg total) by mouth at bedtime. 09/24/22   Alphonsa Glendia DELENA, MD  ezetimibe  (ZETIA ) 10 MG tablet TAKE ONE TABLET (10MG  TOTAL) BY MOUTH DAILY Patient taking differently: Take 10 mg by mouth daily. 12/30/22   Alvan Dorn FALCON, MD  FARXIGA 10 MG TABS tablet Take 10 mg by mouth daily.    [provider]  fexofenadine (ALLEGRA) 180 MG tablet Take 180 mg by mouth daily.    [provider]  fluticasone  (FLONASE ) 50 MCG/ACT nasal spray Place 1 spray into both nostrils daily for 16 days. 08/01/23 09/06/23  Chandra Harlene DELENA, NP  furosemide  (LASIX ) 20 MG tablet Take 1 tablet (20 mg total) by mouth daily as needed (swelling). 04/15/23   Alvan Dorn FALCON, MD  HUMULIN R  U-500 KWIKPEN 500 UNIT/ML KwikPen Inject 50-80 Units into the skin 3 (three) times daily with meals. 80 units in the morning, 50 units at lunch, and 60 units in the evening. 02/11/23   [provider]  HYDROcodone -acetaminophen  (  NORCO) 7.5-325 MG tablet 1 twice daily as needed pain caution drowsiness 09/29/23   Alphonsa Glendia LABOR, MD  HYDROcodone -acetaminophen  (NORCO) 7.5-325 MG tablet 1 twice daily as needed pain caution drowsiness 09/29/23   Alphonsa Glendia LABOR, MD  HYDROcodone -acetaminophen  (NORCO) 7.5-325 MG tablet 1 twice daily as needed pain caution drowsiness 09/29/23   Luking, Glendia LABOR, MD  Insulin  Pen Needle (B-D ULTRAFINE III SHORT PEN) 31G X 8 MM MISC USE 1 NEEDLE 4 TIMES DAILY AS DIRECTED 09/13/18   [provider]  losartan  (COZAAR ) 25 MG tablet TAKE 1 AND 1/2 TABLETS (37.5 MG TOTAL) BY MOUTH DAILY. Patient taking differently: Take 25 mg by mouth daily. 04/06/23   Waddell Danelle ORN, MD   memantine  (NAMENDA ) 10 MG tablet Take 1 tablet (10 mg total) by mouth 2 (two) times daily. 11/14/23   Alphonsa Glendia LABOR, MD  metoprolol  succinate (TOPROL -XL) 25 MG 24 hr tablet TAKE ONE-HALF TABLET (12.5MG  TOTAL) BY MOUTH DAILY 08/08/23   Alvan Dorn FALCON, MD  montelukast  (SINGULAIR ) 10 MG tablet Take 1 tablet (10 mg total) by mouth at bedtime. 09/12/23   Alphonsa Glendia LABOR, MD  nitroGLYCERIN  (NITROSTAT ) 0.4 MG SL tablet PLACE ONE TABLET (0.4MG  TOTAL) UNDER THETONGUE EVERY FIVE MINUTES AS NEEDED FOR CHEST PAIN Patient taking differently: Place 0.4 mg under the tongue every 5 (five) minutes as needed for chest pain. 08/19/21   Strader, Laymon HERO, PA-C  ONETOUCH ULTRA test strip USE 1 STRIP TO CHECK GLUCOSE 4 TIMES DAILY 11/14/18   [provider]  pantoprazole  (PROTONIX ) 40 MG tablet TAKE ONE TABLET BY MOUTH ONCE DAILY, 30 MINUTES BEFORE BREAKFAST. MAY TAKE A SECOND DOSE 30 MINUTES BEFORE DINNER IF NEEDED. 10/26/23   Shaaron Lamar HERO, MD  rosuvastatin  (CRESTOR ) 40 MG tablet TAKE ONE (1) TABLET BY MOUTH EVERY DAY 08/30/23   Alvan Dorn FALCON, MD  sertraline  (ZOLOFT ) 50 MG tablet TAKE ONE (1) TABLET BY MOUTH EVERY DAY 05/29/23   Alphonsa Glendia LABOR, MD  Tiotropium Bromide -Olodaterol (STIOLTO RESPIMAT ) 2.5-2.5 MCG/ACT AERS Inhale 2 puffs into the lungs daily. Patient taking differently: Inhale 2 puffs into the lungs daily as needed (shortness of breath). 05/11/22   Alphonsa Glendia LABOR, MD  vitamin B-12 (CYANOCOBALAMIN ) 1000 MCG tablet Take 6,000 mcg by mouth daily.    [provider]    Allergies: Morphine  and codeine, Penicillins, Percocet [oxycodone -acetaminophen ], Latex, Levaquin  [levofloxacin  in d5w], Metformin  and related, and Tape    Review of Systems  Constitutional:  Positive for fatigue. Negative for fever.  Eyes:  Negative for visual disturbance.  Respiratory:  Positive for shortness of breath.   Cardiovascular:  Positive for chest pain.  Musculoskeletal:  Negative for neck pain.   Neurological:  Positive for dizziness, syncope, weakness and headaches.    Updated Vital Signs BP (!) 165/72   Pulse 60   Temp 98 F (36.7 C)   Resp 11   SpO2 99%   Physical Exam Vitals and nursing note reviewed.  Constitutional:      General: He is not in acute distress.    Appearance: He is well-developed. He is not ill-appearing or diaphoretic.  HENT:     Head: Normocephalic.     Ears:   Eyes:     Extraocular Movements: Extraocular movements intact.     Pupils: Pupils are equal, round, and reactive to light.  Cardiovascular:     Rate and Rhythm: Normal rate and regular rhythm.     Heart sounds: Normal heart sounds.  Pulmonary:  Effort: Pulmonary effort is normal.     Breath sounds: Normal breath sounds.  Abdominal:     General: There is no distension.     Palpations: Abdomen is soft.     Tenderness: There is no abdominal tenderness.  Musculoskeletal:     Cervical back: No spinous process tenderness or muscular tenderness.  Skin:    General: Skin is warm and dry.  Neurological:     Mental Status: He is alert.     Comments: CN 3-12 grossly intact. 5/5 strength in all 4 extremities. Grossly normal sensation. Normal finger to nose.      (all labs ordered are listed, but only abnormal results are displayed) Labs Reviewed  COMPREHENSIVE METABOLIC PANEL WITH GFR - Abnormal; Notable for the following components:      Result Value   Glucose, Bld 170 (*)    Total Protein 6.2 (*)    All other components within normal limits  D-DIMER, QUANTITATIVE - Abnormal; Notable for the following components:   D-Dimer, Quant 0.77 (*)    All other components within normal limits  I-STAT CHEM 8, ED - Abnormal; Notable for the following components:   Glucose, Bld 171 (*)    Calcium , Ion 1.13 (*)    All other components within normal limits  CBC WITH DIFFERENTIAL/PLATELET  URINALYSIS, ROUTINE W REFLEX MICROSCOPIC  TROPONIN I (HIGH SENSITIVITY)  TROPONIN I (HIGH SENSITIVITY)     EKG: EKG Interpretation Date/Time:  Monday November 14 2023 09:22:09 EDT Ventricular Rate:  68 PR Interval:  240 QRS Duration:  96 QT Interval:  410 QTC Calculation: 435 R Axis:   -6  Text Interpretation: Sinus rhythm with 1st degree A-V block Nonspecific T wave abnormality Confirmed by Freddi Hamilton [phone]) on 11/14/2023 9:25:32 AM  Radiology: CT Angio Chest PE W and/or Wo Contrast Result Date: 11/14/2023 CLINICAL DATA:  Syncope, positive D-dimer. EXAM: CT ANGIOGRAPHY CHEST WITH CONTRAST TECHNIQUE: Multidetector CT imaging of the chest was performed using the standard protocol during bolus administration of intravenous contrast. Multiplanar CT image reconstructions and MIPs were obtained to evaluate the vascular anatomy. RADIATION DOSE REDUCTION: This exam was performed according to the departmental dose-optimization program which includes automated exposure control, adjustment of the mA and/or kV according to patient size and/or use of iterative reconstruction technique. CONTRAST:  75mL OMNIPAQUE  IOHEXOL  350 MG/ML SOLN COMPARISON:  July 29, 2023. FINDINGS: Cardiovascular: Satisfactory opacification of the pulmonary arteries to the segmental level. No evidence of pulmonary embolism. Mild cardiomegaly. Status post coronary artery bypass graft. No pericardial effusion. Mediastinum/Nodes: No enlarged mediastinal, hilar, or axillary lymph nodes. Thyroid  gland, trachea, and esophagus demonstrate no significant findings. Lungs/Pleura: Lungs are clear. No pleural effusion or pneumothorax. Upper Abdomen: Cholelithiasis.  Nonobstructive left nephrolithiasis. Musculoskeletal: No chest wall abnormality. No acute or significant osseous findings. Review of the MIP images confirms the above findings. IMPRESSION: No definite evidence of pulmonary embolus. Cholelithiasis. Nonobstructive left nephrolithiasis. Aortic Atherosclerosis (ICD10-I70.0). Electronically Signed   By: Lynwood Landy Raddle M.D.   On: 11/14/2023  11:25   CT HEAD WO CONTRAST Result Date: 11/14/2023 CLINICAL DATA:  Head trauma EXAM: CT HEAD WITHOUT CONTRAST TECHNIQUE: Contiguous axial images were obtained from the base of the skull through the vertex without intravenous contrast. RADIATION DOSE REDUCTION: This exam was performed according to the departmental dose-optimization program which includes automated exposure control, adjustment of the mA and/or kV according to patient size and/or use of iterative reconstruction technique. COMPARISON:  10/26/2023 FINDINGS: Brain: No evidence of acute infarction,  hemorrhage, hydrocephalus, extra-axial collection or mass lesion/mass effect. Vascular: No hyperdense vessel or unexpected calcification. Skull: Normal. Negative for fracture or focal lesion. Sinuses/Orbits: No acute finding. Other: None. IMPRESSION: No acute intracranial pathology. Electronically Signed   By: Marolyn JONETTA Jaksch M.D.   On: 11/14/2023 11:04   CT CERVICAL SPINE WO CONTRAST Result Date: 11/14/2023 CLINICAL DATA:  Neck trauma. EXAM: CT CERVICAL SPINE WITHOUT CONTRAST TECHNIQUE: Multidetector CT imaging of the cervical spine was performed without intravenous contrast. Multiplanar CT image reconstructions were also generated. RADIATION DOSE REDUCTION: This exam was performed according to the departmental dose-optimization program which includes automated exposure control, adjustment of the mA and/or kV according to patient size and/or use of iterative reconstruction technique. COMPARISON:  CT cervical spine 10/07/2020.  Neck CTA 10/26/2023. FINDINGS: Alignment: Chronic reversal of the usual cervical lordosis, similar to previous CT's. Skull base and vertebrae: No evidence of acute cervical spine fracture or traumatic subluxation. Solid interbody fusion from C3 through C7 with residual anterior plate and screws extending from the C5-6 disc into the C7 vertebral body. No hardware loosening. Stable prominent anterior osteophytes at C7-T1. Soft tissues  and spinal canal: No prevertebral fluid or swelling. No visible canal hematoma. Disc levels: Multilevel surgical fusion as described above with associated underlying endplate osteophytes and facet hypertrophy. Progressive prominent chronic synovial thickening around the odontoid process with chronic narrowing of the predental space and increased mass effect on the left aspect of the cord compared with previous CT from 2022. The other disc space levels appear grossly stable. Chronic bilateral facet hypertrophy at C2-3 with mild foraminal narrowing. Upper chest: Clear lung apices.  Left-sided ICD noted. Other: Chronic right mastoid effusion. Bilateral carotid atherosclerosis. IMPRESSION: 1. No evidence of acute cervical spine fracture, traumatic subluxation or static signs of instability. 2. Stable chronic multilevel surgical fusion from C3 through C7. 3. Progressive prominent chronic synovial thickening around the odontoid process and increased mass effect on the left aspect of the cord compared with previous CT. Correlate clinically for myelopathy and consider MRI for further evaluation. Electronically Signed   By: Elsie Perone M.D.   On: 11/14/2023 10:33   DG Chest Port 1 View Result Date: 11/14/2023 CLINICAL DATA:  Syncope. EXAM: PORTABLE CHEST 1 VIEW COMPARISON:  05/26/2023. FINDINGS: Low lung volume. Minimal atelectasis/scarring at the left lung base. Bilateral lung fields are otherwise clear. Bilateral costophrenic angles are clear. Normal cardio-mediastinal silhouette. There are surgical staples along the heart border and sternotomy wires, status post CABG (coronary artery bypass graft). There is a left sided 3-lead pacemaker. No acute osseous abnormalities. Lower cervical spinal fixation hardware noted. The soft tissues are within normal limits. IMPRESSION: No active disease. Electronically Signed   By: Ree Molt M.D.   On: 11/14/2023 09:47     .Laceration Repair  Date/Time: 11/14/2023 3:51  PM  Performed by: Freddi Hamilton, MD Authorized by: Freddi Hamilton, MD   Consent:    Consent obtained:  Verbal   Consent given by:  Patient Laceration details:    Location:  Ear   Ear location:  R ear   Length (cm):  3 Exploration:    Hemostasis achieved with:  Direct pressure   Wound exploration: entire depth of wound visualized   Skin repair:    Repair method:  Tissue adhesive Repair type:    Repair type:  Simple    Medications Ordered in the ED  lactated ringers  bolus 1,000 mL (0 mLs Intravenous Stopped 11/14/23 1550)  iohexol  (OMNIPAQUE ) 350 MG/ML injection  75 mL (75 mLs Intravenous Contrast Given 11/14/23 1114)                                    Medical Decision Making Amount and/or Complexity of Data Reviewed Labs: ordered.    Details: Normal troponin x 2 Radiology: ordered and independent interpretation performed.    Details: No head bleed. No PE ECG/medicine tests: ordered and independent interpretation performed.    Details: No arrhythmia  Risk Prescription drug management.   Patient presents with syncope.  Head CT and neck CT do not show any acute significant trauma.  The CT of his neck does show some progressive chronic findings but he has no acute neurologic signs or symptoms and so I do not think this is acute or needs emergent MRI.  CTA was obtained given the transient right sided chest discomfort and shows no PE.  Troponins are negative x 2.  No acute ischemia on his ECG.  He is no longer symptomatic.  His blood pressure was low initially here and I suspect this was the cause.  No clear reason why unless is from his medications which he is on.  Will have him follow-up closely with his PCP for possible medication titration.  His ICD was interrogated and there were no events.  I highly doubt this is a cardiac cause and since he is now feeling asymptomatic and did well with orthostatics I think he is stable for discharge.  Will have him follow-up closely with  PCP.  Given return precautions.  His ear was closed with Dermabond given how superficial it was.  Follow-up with PCP for this.     Final diagnoses:  Syncope and collapse  Laceration of antihelix of right ear, initial encounter    ED Discharge Orders     None          Freddi Hamilton, MD 11/14/23 1554
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
## 2023-11-14 NOTE — ED Notes (Addendum)
 Received report. Pt resting, no distress noted. Denies dizziness. Denies needs. Call bell within reach.

## 2023-11-14 NOTE — ED Notes (Signed)
 Went to CT

## 2023-11-14 NOTE — Discharge Instructions (Signed)
 Follow-up with your primary care physician in regards to your passing out.  If you develop chest pain, trouble breathing, recurrent dizziness, or any other new/concerning symptoms then return to the ER.

## 2023-11-15 ENCOUNTER — Ambulatory Visit: Admitting: Family Medicine

## 2023-11-15 VITALS — BP 136/74 | HR 78 | Temp 97.9°F | Ht 69.0 in | Wt 230.2 lb

## 2023-11-15 DIAGNOSIS — R55 Syncope and collapse: Secondary | ICD-10-CM | POA: Diagnosis not present

## 2023-11-15 DIAGNOSIS — I951 Orthostatic hypotension: Secondary | ICD-10-CM

## 2023-11-15 MED ORDER — LOSARTAN POTASSIUM 25 MG PO TABS: ORAL_TABLET | ORAL | 1 refills | Status: AC

## 2023-11-15 NOTE — Progress Notes (Signed)
   Subjective:    Patient ID: Lance [name], male    DOB: 11/05/1945, 78 y.o.   MRN: [id number]  HPI Pt comes in today for hospital follow up because he passed out possibly due to blood pressure dropping. Medications and allergies reviewed.  History of Present Illness   The patient presents with a recent episode of syncope and low blood pressure.  He experienced a syncopal episode while at his neck doctor's office in [place]. He suddenly felt funny and then passed out, leading to his revival on the floor and subsequent transport to the ER by ambulance. In the ER, his blood pressure was noted to be low at 90/54 mmHg.  He mentions that his blood pressure medication, losartan , had been increased some time ago, but he did not take it on the day of the incident. He describes feeling great the morning of the incident, having woken up at 6:15 AM, taken his medication, and typically eats a sandwich at 9:30 AM. He did not eat before taking his medication that morning.  He recalls sweating profusely before passing out, with sweat dripping onto the floor. When he was revived, he was lying in a pool of blood due to a cut on his ear, which is now sore along with his chest.  He speculates that he might have taken too many pills, possibly due to an error in how his medication was packed. No issues with eating and drinking, stating he feels great today. His blood sugar was checked and was normal at the time of the incident.      Review of Systems     Objective:   Physical Exam  General-in no acute distress Eyes-no discharge Lungs-respiratory rate normal, CTA CV-no murmurs,RRR Extremities skin warm dry no edema Neuro grossly normal Behavior normal, alert  Blood pressure sitting and standing no appreciable drop     Assessment & Plan:   Syncope Syncopal episode likely due to excessive losartan  dose, causing hypotension. ER evaluation ruled out intracranial bleeding. - Reduce losartan  dosage  to one tablet per day.  - Arrange MRI for further assessment. It is possible that he may have accidentally taken too much blood pressure medicine he states his wife puts his medications in the pillboxes he will talk with her to make sure she is doing it correctly I would recommend reducing the losartan  to 25 mg/day  Hypertension Hypertension managed with losartan . Recent hypotension likely due to excessive dosing. - Adjust losartan  dosage to one tablet per day.  Laceration of the ear Ear laceration from fall.

## 2023-12-02 DIAGNOSIS — E1142 Type 2 diabetes mellitus with diabetic polyneuropathy: Secondary | ICD-10-CM | POA: Diagnosis not present

## 2023-12-02 DIAGNOSIS — Z794 Long term (current) use of insulin: Secondary | ICD-10-CM | POA: Diagnosis not present

## 2023-12-02 DIAGNOSIS — E1165 Type 2 diabetes mellitus with hyperglycemia: Secondary | ICD-10-CM | POA: Diagnosis not present

## 2023-12-02 DIAGNOSIS — I2510 Atherosclerotic heart disease of native coronary artery without angina pectoris: Secondary | ICD-10-CM | POA: Diagnosis not present

## 2023-12-09 ENCOUNTER — Other Ambulatory Visit: Payer: Self-pay | Admitting: Family Medicine

## 2023-12-13 DIAGNOSIS — H04129 Dry eye syndrome of unspecified lacrimal gland: Secondary | ICD-10-CM | POA: Diagnosis not present

## 2023-12-13 DIAGNOSIS — E113553 Type 2 diabetes mellitus with stable proliferative diabetic retinopathy, bilateral: Secondary | ICD-10-CM | POA: Diagnosis not present

## 2023-12-13 DIAGNOSIS — H47012 Ischemic optic neuropathy, left eye: Secondary | ICD-10-CM | POA: Diagnosis not present

## 2023-12-15 ENCOUNTER — Ambulatory Visit: Payer: Medicare Other

## 2023-12-15 DIAGNOSIS — I255 Ischemic cardiomyopathy: Secondary | ICD-10-CM

## 2023-12-15 DIAGNOSIS — M542 Cervicalgia: Secondary | ICD-10-CM | POA: Diagnosis not present

## 2023-12-15 DIAGNOSIS — M47812 Spondylosis without myelopathy or radiculopathy, cervical region: Secondary | ICD-10-CM | POA: Diagnosis not present

## 2023-12-15 DIAGNOSIS — M791 Myalgia, unspecified site: Secondary | ICD-10-CM | POA: Diagnosis not present

## 2023-12-15 LAB — CUP PACEART REMOTE DEVICE CHECK
Battery Remaining Longevity: 77 mo
Battery Remaining Percentage: 79 %
Battery Voltage: 2.99 V
Brady Statistic AP VP Percent: 1 %
Brady Statistic AP VS Percent: 1 %
Brady Statistic AS VP Percent: 1 %
Brady Statistic AS VS Percent: 99 %
Brady Statistic RA Percent Paced: 1 %
Brady Statistic RV Percent Paced: 1 %
Date Time Interrogation Session: 20250814020021
HighPow Impedance: 63 Ohm
HighPow Impedance: 63 Ohm
Implantable Lead Connection Status: 753985
Implantable Lead Connection Status: 753985
Implantable Lead Implant Date: 20050706
Implantable Lead Implant Date: 20161128
Implantable Lead Location: 753859
Implantable Lead Location: 753860
Implantable Lead Model: 7122
Implantable Pulse Generator Implant Date: 20230518
Lead Channel Impedance Value: 360 Ohm
Lead Channel Impedance Value: 390 Ohm
Lead Channel Pacing Threshold Amplitude: 1 V
Lead Channel Pacing Threshold Amplitude: 1 V
Lead Channel Pacing Threshold Pulse Width: 0.5 ms
Lead Channel Pacing Threshold Pulse Width: 0.5 ms
Lead Channel Sensing Intrinsic Amplitude: 2.3 mV
Lead Channel Sensing Intrinsic Amplitude: 9.7 mV
Lead Channel Setting Pacing Amplitude: 2 V
Lead Channel Setting Pacing Amplitude: 2.5 V
Lead Channel Setting Pacing Pulse Width: 0.5 ms
Lead Channel Setting Sensing Sensitivity: 0.5 mV
Pulse Gen Serial Number: 8942717

## 2023-12-18 ENCOUNTER — Ambulatory Visit: Payer: Self-pay | Admitting: Internal Medicine

## 2023-12-20 DIAGNOSIS — M12812 Other specific arthropathies, not elsewhere classified, left shoulder: Secondary | ICD-10-CM | POA: Diagnosis not present

## 2023-12-20 DIAGNOSIS — M25512 Pain in left shoulder: Secondary | ICD-10-CM | POA: Diagnosis not present

## 2023-12-22 ENCOUNTER — Other Ambulatory Visit: Payer: Self-pay | Admitting: Cardiology

## 2023-12-23 DIAGNOSIS — M12812 Other specific arthropathies, not elsewhere classified, left shoulder: Secondary | ICD-10-CM | POA: Diagnosis not present

## 2023-12-23 NOTE — Progress Notes (Signed)
 Pharmacy Quality Measure Review  This patient is appearing on a report for being at risk of failing the adherence measure for cholesterol (statin) medications this calendar year.   Medication: rosuvastatin  40 mg daily Last fill date: 12/22/2023 for 90 day supply  Insurance report was not up to date. No action needed at this time.   Woodie Jock, PharmD PGY1 Pharmacy Resident

## 2023-12-27 ENCOUNTER — Ambulatory Visit (INDEPENDENT_AMBULATORY_CARE_PROVIDER_SITE_OTHER): Admitting: Family Medicine

## 2023-12-27 VITALS — BP 106/63 | HR 87 | Temp 97.9°F | Ht 69.0 in | Wt 213.0 lb

## 2023-12-27 DIAGNOSIS — R6883 Chills (without fever): Secondary | ICD-10-CM | POA: Diagnosis not present

## 2023-12-27 DIAGNOSIS — I959 Hypotension, unspecified: Secondary | ICD-10-CM

## 2023-12-27 DIAGNOSIS — B349 Viral infection, unspecified: Secondary | ICD-10-CM

## 2023-12-27 DIAGNOSIS — R52 Pain, unspecified: Secondary | ICD-10-CM

## 2023-12-27 NOTE — Progress Notes (Signed)
 Discussed the use of AI scribe software for clinical note transcription with the patient, who gave verbal consent to proceed.  History of Present Illness   Lance [name] is a 78 year old male with hypertension who presents with dizziness and flu-like symptoms.  He has experienced a significant drop in blood pressure, leading to dizziness and a fall. His blood pressure readings have been generally stable except for yesterday when he felt extremely dizzy upon standing. He recalls a blood pressure reading of 166/87 while sitting and 167/104 while standing, with other readings fluctuating between 145/65 and 154/90. He reports that he is currently taking losartan .  He describes the onset of flu-like symptoms starting yesterday evening, including chills, rhinorrhea, watery eyes, and a sensation of 'paper bag crackling' in his ear. He also reports a mild cough and expectoration of phlegm. He woke up this morning with diarrhea.   He discusses challenges at home, including family stressors related to his grandson, Will, who is unemployed and not contributing to household responsibilities. He feels depressed and frustrated with the situation.     Mild sinus congestion pressure started off as a sore throat denies wheezing but having some chills no fever currently  Gen-NAD not toxic TMS-normal bilateral T- normal no redness Chest-CTA respiratory rate normal no crackles CV RRR no murmur Skin-warm dry Neuro-grossly normal  COVID test sent Viral syndrome Blood pressure is lower than normal and does drop with standing Stop losartan  Follow-up later this fall for recheck

## 2023-12-29 ENCOUNTER — Ambulatory Visit: Payer: Self-pay | Admitting: Family Medicine

## 2023-12-29 ENCOUNTER — Other Ambulatory Visit: Payer: Self-pay | Admitting: Family Medicine

## 2023-12-29 LAB — NOVEL CORONAVIRUS, NAA: SARS-CoV-2, NAA: NOT DETECTED

## 2023-12-29 LAB — TOXASSURE SELECT 13 (MW), URINE

## 2023-12-29 LAB — SPECIMEN STATUS REPORT

## 2024-01-05 DIAGNOSIS — Z981 Arthrodesis status: Secondary | ICD-10-CM | POA: Diagnosis not present

## 2024-01-05 DIAGNOSIS — M47812 Spondylosis without myelopathy or radiculopathy, cervical region: Secondary | ICD-10-CM | POA: Diagnosis not present

## 2024-01-05 DIAGNOSIS — M542 Cervicalgia: Secondary | ICD-10-CM | POA: Diagnosis not present

## 2024-01-08 ENCOUNTER — Ambulatory Visit: Payer: Self-pay | Admitting: Internal Medicine

## 2024-01-08 NOTE — Telephone Encounter (Signed)
 CT March 2025 shows mild emphysema but is mild because PFT is nromal. Allergy  test is normal  Plan  Give followup to see APP = - I seee no followup       Latest Ref Rng & Units 10/06/2023    8:45 AM 12/08/2018    2:35 PM 03/15/2014   12:03 PM  PFT Results  FVC-Pre L 3.32  3.59  3.40   FVC-Predicted Pre % 81  84  77   FVC-Post L 3.19  3.81  3.02   FVC-Predicted Post % 78  89  68   Pre FEV1/FVC % % 77  79  83   Post FEV1/FCV % % 78  81  86   FEV1-Pre L 2.56  2.83  2.82   FEV1-Predicted Pre % 87  90  86   FEV1-Post L 2.50  3.10  2.59   DLCO uncorrected ml/min/mmHg 20.15  23.78  22.89   DLCO UNC% % 83  94  73   DLCO corrected ml/min/mmHg   22.89   DLCO COR %Predicted %   73   DLVA Predicted % 109  105  115   TLC L 6.32   5.39   TLC % Predicted % 92   79   RV % Predicted % 114   96

## 2024-01-09 NOTE — Progress Notes (Signed)
 I called and spoke to pt. Pt informed of Dr Chadwick note and was scheduled to see Landry Ferrari, NP on 01-18-24 at 1:30. NFN

## 2024-01-10 NOTE — Telephone Encounter (Signed)
 Copied from CRM [id number]. Topic: Clinical - Medication Question >> Jan 09, 2024  3:38 PM Willma R wrote: Reason for CRM: Patient is having treatment from his neck doctor and needs to be off his clopidogrel  (PLAVIX ) 75 MG tablet 3 different times, each period being 7 days. Dr wanted to make sure Dr Alphonsa was aware.  Patient can be reached at [phone]

## 2024-01-17 ENCOUNTER — Other Ambulatory Visit: Payer: Self-pay | Admitting: Family Medicine

## 2024-01-17 ENCOUNTER — Telehealth: Payer: Self-pay | Admitting: Family Medicine

## 2024-01-17 DIAGNOSIS — M47812 Spondylosis without myelopathy or radiculopathy, cervical region: Secondary | ICD-10-CM | POA: Diagnosis not present

## 2024-01-17 NOTE — Telephone Encounter (Signed)
 Patient dropped off form to be completed in red folder in your box.

## 2024-01-18 ENCOUNTER — Ambulatory Visit: Admitting: Primary Care
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
## 2024-01-23 ENCOUNTER — Encounter: Payer: Self-pay | Admitting: Gastroenterology

## 2024-01-23 ENCOUNTER — Ambulatory Visit: Admitting: Gastroenterology

## 2024-01-23 VITALS — BP 126/67 | HR 79 | Temp 98.1°F | Ht 69.0 in | Wt 216.6 lb

## 2024-01-23 DIAGNOSIS — R197 Diarrhea, unspecified: Secondary | ICD-10-CM

## 2024-01-23 DIAGNOSIS — K219 Gastro-esophageal reflux disease without esophagitis: Secondary | ICD-10-CM | POA: Diagnosis not present

## 2024-01-23 DIAGNOSIS — A09 Infectious gastroenteritis and colitis, unspecified: Secondary | ICD-10-CM

## 2024-01-23 MED ORDER — DICYCLOMINE HCL 10 MG PO CAPS: 10.0000 mg | ORAL_CAPSULE | Freq: Three times a day (TID) | ORAL | 1 refills | Status: AC | PRN

## 2024-01-23 NOTE — Patient Instructions (Signed)
 Please complete stool studies.  Start dicyclomine  10mg  up to three times daily as needed for loose stools. Please take one every morning at least, then take additional doses as needed up to 3 per day.  Hold imodium right now, don't take while using dicyclomine .   Make sure you are taking pantoprazole  40mg  before breakfast and before supper right now since you are having more reflux symptoms.   Trulicity may be exacerbating your reflux and contributing to change in bowels. It may also be source for some of your weight loss. We will monitor for now.

## 2024-01-24 DIAGNOSIS — A09 Infectious gastroenteritis and colitis, unspecified: Secondary | ICD-10-CM | POA: Diagnosis not present

## 2024-01-24 DIAGNOSIS — K219 Gastro-esophageal reflux disease without esophagitis: Secondary | ICD-10-CM | POA: Diagnosis not present

## 2024-01-24 NOTE — Telephone Encounter (Signed)
 Patient came by this morning checking on form in your office in red folder. He is needing it as soon as possible

## 2024-01-24 NOTE — Telephone Encounter (Signed)
 Form completed Please finish administrative portion

## 2024-01-26 ENCOUNTER — Ambulatory Visit: Payer: Self-pay | Admitting: Gastroenterology

## 2024-01-26 LAB — GI PROFILE, STOOL, PCR

## 2024-01-26 LAB — CALPROTECTIN, FECAL: Calprotectin, Fecal: 50 ug/g (ref 0–120)

## 2024-01-26 LAB — PANCREATIC ELASTASE, FECAL: Pancreatic Elastase, Fecal: 723 ug Elast./g (ref >200)

## 2024-01-26 NOTE — Progress Notes (Signed)
Remote ICD Transmission.

## 2024-02-01 DIAGNOSIS — H47012 Ischemic optic neuropathy, left eye: Secondary | ICD-10-CM | POA: Diagnosis not present

## 2024-02-01 DIAGNOSIS — H40003 Preglaucoma, unspecified, bilateral: Secondary | ICD-10-CM | POA: Diagnosis not present

## 2024-02-01 DIAGNOSIS — Z961 Presence of intraocular lens: Secondary | ICD-10-CM | POA: Diagnosis not present

## 2024-02-01 DIAGNOSIS — H401123 Primary open-angle glaucoma, left eye, severe stage: Secondary | ICD-10-CM | POA: Diagnosis not present

## 2024-02-01 DIAGNOSIS — E113553 Type 2 diabetes mellitus with stable proliferative diabetic retinopathy, bilateral: Secondary | ICD-10-CM | POA: Diagnosis not present

## 2024-02-01 DIAGNOSIS — H401114 Primary open-angle glaucoma, right eye, indeterminate stage: Secondary | ICD-10-CM | POA: Diagnosis not present

## 2024-02-10 ENCOUNTER — Ambulatory Visit: Admitting: Internal Medicine

## 2024-02-14 ENCOUNTER — Telehealth: Payer: Self-pay | Admitting: Internal Medicine

## 2024-02-14 DIAGNOSIS — M47812 Spondylosis without myelopathy or radiculopathy, cervical region: Secondary | ICD-10-CM | POA: Diagnosis not present

## 2024-02-14 NOTE — Telephone Encounter (Signed)
 Spoke with Loss adjuster, chartered per [employer].  Pt requesting device information for different treating physician.  Will email copy of device information.  Await further needs.

## 2024-02-14 NOTE — Telephone Encounter (Signed)
 Pt requesting a copy of device paperwork

## 2024-02-16 ENCOUNTER — Encounter: Payer: Self-pay | Admitting: Family Medicine

## 2024-02-16 ENCOUNTER — Ambulatory Visit (INDEPENDENT_AMBULATORY_CARE_PROVIDER_SITE_OTHER): Admitting: Family Medicine

## 2024-02-16 VITALS — BP 138/80 | HR 87 | Temp 98.2°F | Ht 69.0 in | Wt 214.2 lb

## 2024-02-16 DIAGNOSIS — J019 Acute sinusitis, unspecified: Secondary | ICD-10-CM | POA: Diagnosis not present

## 2024-02-16 MED ORDER — DOXYCYCLINE HYCLATE 100 MG PO CAPS: 100.0000 mg | ORAL_CAPSULE | Freq: Two times a day (BID) | ORAL | 0 refills | Status: DC

## 2024-02-16 NOTE — Progress Notes (Signed)
   Subjective:    Patient ID: Helayne DELENA Molt, male    DOB: 12-12-1945, 78 y.o.   MRN: [id number]  HPI Pt. Is in room 7.  Pt. Is complaining about tightness in the chest area. Which started Saturday he stated.  Pt. Stated having a runny nose and coughing up white mucus.   Pt. Stated to have soreness/body aches.  He relates head congestion drainage coughing and some bodyaches in addition to this at times bringing up discolored phlegm other times white phlegm finds himself getting short winded when he pushes himself denies substernal chest pain it is more of a musculoskeletal pain of a sharp pain on the left side of his chest when he takes a deep breath and worse with coughing no lightheadedness no passing out no high fevers   Review of Systems     Objective:   Physical Exam Does not appear in any distress HEENT is benign lungs are clear hearts regular Patient complains of sinus pressure and congestion   I do not feel that the patient needs steroids I would not recommend chest x-ray or blood work currently    Assessment & Plan:   Sinusitis-antibiotics 10 days as directed Pulmonary infection more than likely viral but if he has progressive troubles such as high fevers increased congestion shortness of breath will need chest x-ray and further workup patient aware No need to go to ER currently

## 2024-02-24 ENCOUNTER — Other Ambulatory Visit: Payer: Self-pay | Admitting: Cardiology

## 2024-02-28 ENCOUNTER — Ambulatory Visit: Admitting: Internal Medicine

## 2024-02-28 ENCOUNTER — Encounter: Payer: Self-pay | Admitting: Internal Medicine

## 2024-02-28 VITALS — BP 122/68 | HR 79 | Temp 98.1°F | Ht 69.0 in | Wt 216.2 lb

## 2024-02-28 DIAGNOSIS — R197 Diarrhea, unspecified: Secondary | ICD-10-CM | POA: Diagnosis not present

## 2024-02-28 DIAGNOSIS — K219 Gastro-esophageal reflux disease without esophagitis: Secondary | ICD-10-CM

## 2024-02-28 DIAGNOSIS — I472 Ventricular tachycardia, unspecified: Secondary | ICD-10-CM | POA: Diagnosis not present

## 2024-02-28 NOTE — Patient Instructions (Signed)
 It was nice to see you again today!  Glad your GI symptoms have improved  Continue Protonix  40 mg once daily  Continue Bentyl  10 mg orally twice daily to control bowel function  You may or may not need another colonoscopy in 2031  Plan to see back in the office in 6 months  If you have any difficulties between now and then please call me
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
## 2024-02-29 DIAGNOSIS — I472 Ventricular tachycardia, unspecified: Secondary | ICD-10-CM | POA: Insufficient documentation

## 2024-03-12 ENCOUNTER — Other Ambulatory Visit: Payer: Self-pay

## 2024-03-12 NOTE — Progress Notes (Signed)
 Pharmacy Quality Measure Review  This patient is appearing on a report for being at risk of failing the adherence measure for hypertension (ACEi/ARB) medications this calendar year.   Medication: losartan  25 mg  Last fill date: 11/15/23 for 90 day supply  Spoke with Helayne over the phone in regard to medication management. He states that he has been taking his medications as prescribed. He is unsure if he has missed any doses recently, but he knows that he is due for a fill of his blood pressure medication. He does have a pill organizer at home, which his wife helps him with. Reports higher blood pressure readings over the weekend, approximately 130-140 systolic. [facility] and requested a refill of his losartan . He is to pick up the medication this afternoon.   Woodie Jock, PharmD PGY1 Pharmacy Resident  03/12/2024

## 2024-03-15 ENCOUNTER — Ambulatory Visit (INDEPENDENT_AMBULATORY_CARE_PROVIDER_SITE_OTHER): Payer: Medicare Other

## 2024-03-15 DIAGNOSIS — I255 Ischemic cardiomyopathy: Secondary | ICD-10-CM

## 2024-03-16 ENCOUNTER — Telehealth: Payer: Self-pay

## 2024-03-16 LAB — CUP PACEART REMOTE DEVICE CHECK
Battery Remaining Longevity: 76 mo
Battery Remaining Percentage: 77 %
Battery Voltage: 2.99 V
Brady Statistic AP VP Percent: 1 %
Brady Statistic AP VS Percent: 1 %
Brady Statistic AS VP Percent: 1 %
Brady Statistic AS VS Percent: 99 %
Brady Statistic RA Percent Paced: 1 %
Brady Statistic RV Percent Paced: 1 %
Date Time Interrogation Session: 20251113032146
HighPow Impedance: 60 Ohm
HighPow Impedance: 60 Ohm
Implantable Lead Connection Status: 753985
Implantable Lead Connection Status: 753985
Implantable Lead Implant Date: 20050706
Implantable Lead Implant Date: 20161128
Implantable Lead Location: 753859
Implantable Lead Location: 753860
Implantable Lead Model: 7122
Implantable Pulse Generator Implant Date: 20230518
Lead Channel Impedance Value: 350 Ohm
Lead Channel Impedance Value: 380 Ohm
Lead Channel Pacing Threshold Amplitude: 1 V
Lead Channel Pacing Threshold Amplitude: 1 V
Lead Channel Pacing Threshold Pulse Width: 0.5 ms
Lead Channel Pacing Threshold Pulse Width: 0.5 ms
Lead Channel Sensing Intrinsic Amplitude: 2.6 mV
Lead Channel Sensing Intrinsic Amplitude: 9.4 mV
Lead Channel Setting Pacing Amplitude: 2 V
Lead Channel Setting Pacing Amplitude: 2.5 V
Lead Channel Setting Pacing Pulse Width: 0.5 ms
Lead Channel Setting Sensing Sensitivity: 0.5 mV
Pulse Gen Serial Number: 8942717

## 2024-03-18 ENCOUNTER — Ambulatory Visit: Payer: Self-pay | Admitting: Internal Medicine

## 2024-03-19 ENCOUNTER — Ambulatory Visit: Admitting: Urology

## 2024-03-19 NOTE — Telephone Encounter (Signed)
 Error

## 2024-03-20 NOTE — Progress Notes (Signed)
 Remote ICD Transmission

## 2024-04-02 ENCOUNTER — Ambulatory Visit: Admitting: Urology

## 2024-04-19 ENCOUNTER — Telehealth: Payer: Self-pay

## 2024-04-19 DIAGNOSIS — E782 Mixed hyperlipidemia: Secondary | ICD-10-CM

## 2024-04-19 NOTE — Progress Notes (Signed)
 Pharmacy Quality Measure Review  This patient is appearing on a report for being at risk of failing the adherence measure for cholesterol (statin) medications this calendar year.   Medication: Rosuvastatin  40mg  Last fill date: 08/21 for 90 day supply  Patient's wife manages his medications. He is unsure whether he is out, but believes he takes it every day and intends to do so. Will send message to clinic PharmD to send refill so that he has enough. All questions were answered.  Palyn Scrima, PharmD [facility] Pharmacist

## 2024-04-23 ENCOUNTER — Encounter: Payer: Self-pay | Admitting: Cardiology

## 2024-04-23 ENCOUNTER — Ambulatory Visit: Attending: Cardiology | Admitting: Cardiology

## 2024-04-23 VITALS — BP 138/82 | HR 68 | Ht 69.0 in | Wt 216.4 lb

## 2024-04-23 DIAGNOSIS — I10 Essential (primary) hypertension: Secondary | ICD-10-CM | POA: Diagnosis not present

## 2024-04-23 DIAGNOSIS — E782 Mixed hyperlipidemia: Secondary | ICD-10-CM

## 2024-04-23 DIAGNOSIS — I2510 Atherosclerotic heart disease of native coronary artery without angina pectoris: Secondary | ICD-10-CM | POA: Diagnosis not present

## 2024-04-23 MED ORDER — ASPIRIN 81 MG PO TBEC: 81.0000 mg | DELAYED_RELEASE_TABLET | Freq: Every day | ORAL | Status: AC

## 2024-04-23 NOTE — Patient Instructions (Addendum)
 Medication Instructions:  Continue all current medications.   Labwork: none  Testing/Procedures: none  Follow-Up: 6 months   Any Other Special Instructions Will Be Listed Below (If Applicable).   If you need a refill on your cardiac medications before your next appointment, please call your pharmacy.
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
## 2024-04-24 ENCOUNTER — Telehealth: Payer: Self-pay | Admitting: Pharmacist

## 2024-04-24 MED ORDER — ROSUVASTATIN CALCIUM 40 MG PO TABS: 40.0000 mg | ORAL_TABLET | Freq: Every day | ORAL | 2 refills | Status: AC

## 2024-04-24 NOTE — Telephone Encounter (Signed)
 Pharmacy Quality Measure Review   This patient is appearing on a report for being at risk of failing the adherence measure for cholesterol (statin) medications this calendar year.   Medication: Rosuvastatin  40mg  Last fill date: 08/21 for 90 day supply   Patient's wife manages his medications. He is unsure whether he is out, but believes he takes it every day and intends to do so. Refills sent to [facility]   Mliss Tarry Griffin, PharmD, BCACP, CPP Clinical Pharmacist, [facility]

## 2024-05-15 ENCOUNTER — Ambulatory Visit: Admitting: Internal Medicine

## 2024-05-15 ENCOUNTER — Encounter: Payer: Self-pay | Admitting: Internal Medicine

## 2024-05-15 VITALS — BP 125/69 | HR 64 | Temp 97.4°F | Ht 69.0 in | Wt 215.4 lb

## 2024-05-15 DIAGNOSIS — K5900 Constipation, unspecified: Secondary | ICD-10-CM

## 2024-05-15 DIAGNOSIS — R197 Diarrhea, unspecified: Secondary | ICD-10-CM | POA: Diagnosis not present

## 2024-05-15 DIAGNOSIS — R140 Abdominal distension (gaseous): Secondary | ICD-10-CM

## 2024-05-15 DIAGNOSIS — R143 Flatulence: Secondary | ICD-10-CM | POA: Diagnosis not present

## 2024-05-15 DIAGNOSIS — K219 Gastro-esophageal reflux disease without esophagitis: Secondary | ICD-10-CM
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
## 2024-05-15 NOTE — Patient Instructions (Signed)
 It was good to see you again today  I would like you to start taking a probiotic for gas bloat try a product called digestive advantage for gas bloat or align take 1 supplement daily  Begin Benefiber 1 tablespoon daily for 3 weeks then increase to 2 tablespoons thereafter.  This may actually bloat you a little bit more for short time but then it should settle out.  We need to bulk up your stool so it comes out at 1 time and you do not have to wipe too much  I am hopeful a probiotic will settle your gas and cramping down.  We will see you back in 6 weeks and reassess.  As discussed, we may need to do some adjustment in this regimen depending on how you respond  Continue taking Protonix  40 mg once to twice daily.

## 2024-05-29 ENCOUNTER — Other Ambulatory Visit: Payer: Self-pay | Admitting: Family Medicine

## 2024-05-30 ENCOUNTER — Other Ambulatory Visit: Payer: Self-pay | Admitting: Family Medicine

## 2024-05-30 ENCOUNTER — Telehealth: Payer: Self-pay | Admitting: Family Medicine

## 2024-05-30 ENCOUNTER — Encounter: Payer: Self-pay | Admitting: Family Medicine

## 2024-05-30 MED ORDER — ALPRAZOLAM 0.25 MG PO TABS: ORAL_TABLET | ORAL | 0 refills | Status: AC

## 2024-05-30 NOTE — Telephone Encounter (Signed)
 Called pt and he states he understood to Not quit cold turkey and to slowly take 0.25 mg over the following month as it can have effects with oxy  Pt declined needing an appt to discuss further

## 2024-05-30 NOTE — Telephone Encounter (Signed)
 Nurses Please let patient know we did receive prescription request for alprazolam  0.5 mg (This is a medication that he has been on periodically for a long span of time off and on)  Please let him know that with his current pain medication being on regular dosing of oxycodone  and is not recommended to stay on alprazolam .  I would recommend tapering down over the next few weeks 0.25 mg may be used twice daily as needed but beyond the next month I do recommend that he stop taking alprazolam .  Because alprazolam  along with pain medication has increased risk of accidental side effects including possibility of increased risk of injury and death Alprazolam  should only be used when at home and over the next 30 days he should taper off of this medicine If he needs to sit down discussion regarding how to do so we would be more than willing to do so.  Also   medical board now advising against the regular usage of alprazolam  with pain medicine specifically stronger pain medicines thank you  If you try reaching them once or twice and cannot get a hold of them let us  know and we can send a letter thank you

## 2024-05-31 ENCOUNTER — Telehealth: Payer: Self-pay | Admitting: Family Medicine

## 2024-05-31 NOTE — Telephone Encounter (Signed)
 Call Center took this message   Did the patient discuss referral with their provider in the last year? Yes    (If No - schedule appointment)    (If Yes - send message)        Appointment offered? Yes        Type of order/referral and detailed reason for visit: Orthopedic, spine specialist        Preference of office, provider, location: Dr. Gust        If referral order, have you been seen by this specialty before? Yes    reports pt will now need a referral for any future visits with Dr. Gust.    (If Yes, this issue or another issue? When? Where?      Can we respond through MyChart? No, prefers phone [phone].

## 2024-05-31 NOTE — Telephone Encounter (Signed)
 May have referral for Dr.Cohen following specialist for back pain

## 2024-06-01 ENCOUNTER — Other Ambulatory Visit: Payer: Self-pay

## 2024-06-01 DIAGNOSIS — M549 Dorsalgia, unspecified: Secondary | ICD-10-CM

## 2024-06-01 NOTE — Telephone Encounter (Signed)
 Referral has been placed.

## 2024-06-14 ENCOUNTER — Ambulatory Visit

## 2024-06-26 ENCOUNTER — Ambulatory Visit: Admitting: Internal Medicine

## 2024-09-13 ENCOUNTER — Ambulatory Visit

## 2024-12-13 ENCOUNTER — Ambulatory Visit

## 2025-03-14 ENCOUNTER — Ambulatory Visit
